# Patient Record
Sex: Male | Born: 1960 | Race: White | Hispanic: No | Marital: Married | Smoking: Former smoker
Health system: Southern US, Community
[De-identification: ages and names within clinical notes are randomized; demographics above are authoritative.]

## PROBLEM LIST (undated history)

## (undated) DIAGNOSIS — Z90410 Acquired total absence of pancreas: Secondary | ICD-10-CM

## (undated) DIAGNOSIS — E891 Postprocedural hypoinsulinemia: Secondary | ICD-10-CM

## (undated) DIAGNOSIS — C7A8 Other malignant neuroendocrine tumors: Secondary | ICD-10-CM

## (undated) DIAGNOSIS — I951 Orthostatic hypotension: Secondary | ICD-10-CM

## (undated) DIAGNOSIS — Z7189 Other specified counseling: Secondary | ICD-10-CM

## (undated) DIAGNOSIS — D497 Neoplasm of unspecified behavior of endocrine glands and other parts of nervous system: Secondary | ICD-10-CM

## (undated) DIAGNOSIS — R51 Headache: Secondary | ICD-10-CM

## (undated) DIAGNOSIS — Q858 Other phakomatoses, not elsewhere classified: Secondary | ICD-10-CM

## (undated) DIAGNOSIS — D509 Iron deficiency anemia, unspecified: Secondary | ICD-10-CM

## (undated) DIAGNOSIS — Q8583 Von Hippel-Lindau syndrome: Secondary | ICD-10-CM

## (undated) DIAGNOSIS — C7B8 Other secondary neuroendocrine tumors: Secondary | ICD-10-CM

## (undated) DIAGNOSIS — E139 Other specified diabetes mellitus without complications: Secondary | ICD-10-CM

## (undated) DIAGNOSIS — Z9289 Personal history of other medical treatment: Secondary | ICD-10-CM

## (undated) DIAGNOSIS — I10 Essential (primary) hypertension: Secondary | ICD-10-CM

## (undated) DIAGNOSIS — D649 Anemia, unspecified: Secondary | ICD-10-CM

## (undated) DIAGNOSIS — C642 Malignant neoplasm of left kidney, except renal pelvis: Secondary | ICD-10-CM

## (undated) DIAGNOSIS — E119 Type 2 diabetes mellitus without complications: Secondary | ICD-10-CM

## (undated) HISTORY — PX: ENUCLEATION: SHX628

## (undated) HISTORY — PX: BACK SURGERY: SHX140

## (undated) HISTORY — PX: CRANIOTOMY FOR TUMOR: SUR345

## (undated) HISTORY — PX: MUSCLE FLAP MYOCUTANEOUS / FASCIOCUTANEOUS OF TRUNK: SUR869

## (undated) HISTORY — PX: CERVICAL SPINE SURGERY: SHX589

## (undated) HISTORY — PX: RECONSTRUCTION BREAST W/ TRAM FLAP: SUR1079

## (undated) HISTORY — PX: PERIPHERALLY INSERTED CENTRAL CATHETER INSERTION: SHX2221

## (undated) HISTORY — PX: PANCREAS SURGERY: SHX731

## (undated) HISTORY — DX: Iron deficiency anemia, unspecified: D50.9

## (undated) HISTORY — PX: PARTIAL NEPHRECTOMY: SHX414

---
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
## 1998-06-13 ENCOUNTER — Ambulatory Visit (HOSPITAL_COMMUNITY): Admission: RE | Admit: 1998-06-13 | Discharge: 1998-06-13 | Payer: Self-pay | Admitting: Neurosurgery

## 1999-02-09 ENCOUNTER — Ambulatory Visit (HOSPITAL_COMMUNITY): Admission: RE | Admit: 1999-02-09 | Discharge: 1999-02-09 | Payer: Self-pay | Admitting: Urology

## 1999-02-09 ENCOUNTER — Encounter: Payer: Self-pay | Admitting: Urology

## 1999-03-26 ENCOUNTER — Ambulatory Visit (HOSPITAL_COMMUNITY): Admission: RE | Admit: 1999-03-26 | Discharge: 1999-03-26 | Payer: Self-pay | Admitting: Neurosurgery

## 1999-03-26 ENCOUNTER — Encounter: Payer: Self-pay | Admitting: Neurosurgery

## 1999-09-24 ENCOUNTER — Encounter: Payer: Self-pay | Admitting: Neurosurgery

## 1999-09-24 ENCOUNTER — Ambulatory Visit (HOSPITAL_COMMUNITY): Admission: RE | Admit: 1999-09-24 | Discharge: 1999-09-24 | Payer: Self-pay | Admitting: Neurosurgery

## 2000-10-04 ENCOUNTER — Ambulatory Visit (HOSPITAL_COMMUNITY): Admission: RE | Admit: 2000-10-04 | Discharge: 2000-10-04 | Payer: Self-pay | Admitting: Urology

## 2000-10-04 ENCOUNTER — Encounter: Payer: Self-pay | Admitting: Urology

## 2000-10-06 ENCOUNTER — Encounter: Payer: Self-pay | Admitting: Orthopedic Surgery

## 2000-10-06 ENCOUNTER — Ambulatory Visit (HOSPITAL_COMMUNITY): Admission: RE | Admit: 2000-10-06 | Discharge: 2000-10-06 | Payer: Self-pay | Admitting: Orthopedic Surgery

## 2001-03-08 ENCOUNTER — Ambulatory Visit (HOSPITAL_COMMUNITY): Admission: RE | Admit: 2001-03-08 | Discharge: 2001-03-08 | Payer: Self-pay | Admitting: *Deleted

## 2001-03-09 ENCOUNTER — Encounter (INDEPENDENT_AMBULATORY_CARE_PROVIDER_SITE_OTHER): Payer: Self-pay | Admitting: Specialist

## 2001-03-09 ENCOUNTER — Encounter: Payer: Self-pay | Admitting: Urology

## 2001-03-09 ENCOUNTER — Inpatient Hospital Stay (HOSPITAL_COMMUNITY): Admission: AD | Admit: 2001-03-09 | Discharge: 2001-04-19 | Payer: Self-pay | Admitting: Urology

## 2001-03-13 ENCOUNTER — Encounter: Payer: Self-pay | Admitting: Urology

## 2006-05-31 ENCOUNTER — Ambulatory Visit (HOSPITAL_COMMUNITY): Admission: RE | Admit: 2006-05-31 | Discharge: 2006-05-31 | Payer: Self-pay | Admitting: Neurosurgery

## 2008-04-23 ENCOUNTER — Ambulatory Visit: Payer: Self-pay | Admitting: Hematology & Oncology

## 2008-05-15 LAB — CBC & DIFF AND RETIC
BASO%: 0.4 % (ref 0.0–2.0)
EOS%: 1.5 % (ref 0.0–7.0)
HGB: 11.2 g/dL — ABNORMAL LOW (ref 13.0–17.1)
IRF: 0.21 (ref 0.070–0.380)
MCH: 25.3 pg — ABNORMAL LOW (ref 28.0–33.4)
MCHC: 33.1 g/dL (ref 32.0–35.9)
RDW: 16.4 % — ABNORMAL HIGH (ref 11.2–14.6)
RETIC #: 87.1 10*3/uL (ref 31.8–103.9)
Retic %: 2 % (ref 0.7–2.3)
lymph#: 1.7 10*3/uL (ref 0.9–3.3)

## 2008-05-15 LAB — CHCC SMEAR

## 2008-05-17 LAB — FERRITIN: Ferritin: 10 ng/mL — ABNORMAL LOW (ref 22–322)

## 2008-05-17 LAB — COMPREHENSIVE METABOLIC PANEL
ALT: 37 U/L (ref 0–53)
AST: 18 U/L (ref 0–37)
Creatinine, Ser: 0.94 mg/dL (ref 0.40–1.50)
Total Bilirubin: 0.2 mg/dL — ABNORMAL LOW (ref 0.3–1.2)

## 2008-05-17 LAB — ERYTHROPOIETIN: Erythropoietin: 23.7 m[IU]/mL (ref 2.6–34.0)

## 2008-06-04 ENCOUNTER — Ambulatory Visit: Payer: Self-pay | Admitting: Hematology & Oncology

## 2008-06-24 ENCOUNTER — Ambulatory Visit: Payer: Self-pay | Admitting: Hematology & Oncology

## 2008-06-26 LAB — CBC WITH DIFFERENTIAL (CANCER CENTER ONLY)
BASO%: 0.6 % (ref 0.0–2.0)
LYMPH#: 2.1 10*3/uL (ref 0.9–3.3)
LYMPH%: 23.9 % (ref 14.0–48.0)
MONO#: 0.9 10*3/uL (ref 0.1–0.9)
Platelets: 350 10*3/uL (ref 145–400)
RDW: 15.8 % — ABNORMAL HIGH (ref 10.5–14.6)
WBC: 8.7 10*3/uL (ref 4.0–10.0)

## 2008-06-26 LAB — FERRITIN: Ferritin: 108 ng/mL (ref 22–322)

## 2008-06-26 LAB — RETICULOCYTES (CHCC)
ABS Retic: 20.5 10*3/uL (ref 19.0–186.0)
Retic Ct Pct: 0.4 % (ref 0.4–3.1)

## 2008-08-28 ENCOUNTER — Inpatient Hospital Stay (HOSPITAL_COMMUNITY): Admission: RE | Admit: 2008-08-28 | Discharge: 2008-09-01 | Payer: Self-pay | Admitting: Gastroenterology

## 2008-08-28 ENCOUNTER — Encounter (INDEPENDENT_AMBULATORY_CARE_PROVIDER_SITE_OTHER): Payer: Self-pay | Admitting: Gastroenterology

## 2008-08-29 ENCOUNTER — Ambulatory Visit: Payer: Self-pay | Admitting: Hematology & Oncology

## 2008-09-13 ENCOUNTER — Ambulatory Visit: Payer: Self-pay | Admitting: Hematology & Oncology

## 2008-10-02 ENCOUNTER — Observation Stay (HOSPITAL_COMMUNITY): Admission: EM | Admit: 2008-10-02 | Discharge: 2008-10-07 | Payer: Self-pay | Admitting: Emergency Medicine

## 2009-03-20 ENCOUNTER — Ambulatory Visit: Payer: Self-pay | Admitting: Hematology & Oncology

## 2009-03-21 LAB — CBC WITH DIFFERENTIAL (CANCER CENTER ONLY)
BASO%: 0.6 % (ref 0.0–2.0)
EOS%: 2.1 % (ref 0.0–7.0)
LYMPH%: 26.3 % (ref 14.0–48.0)
MCH: 27.1 pg — ABNORMAL LOW (ref 28.0–33.4)
MCV: 81 fL — ABNORMAL LOW (ref 82–98)
MONO%: 10.9 % (ref 0.0–13.0)
Platelets: 193 10*3/uL (ref 145–400)
RDW: 11.8 % (ref 10.5–14.6)

## 2009-03-21 LAB — RETICULOCYTES (CHCC)
RBC.: 4.71 MIL/uL (ref 4.22–5.81)
Retic Ct Pct: 0.4 % (ref 0.4–3.1)

## 2009-03-21 LAB — CHCC SATELLITE - SMEAR

## 2009-03-25 ENCOUNTER — Ambulatory Visit: Admission: RE | Admit: 2009-03-25 | Discharge: 2009-06-23 | Payer: Self-pay | Admitting: Radiation Oncology

## 2009-04-03 ENCOUNTER — Inpatient Hospital Stay (HOSPITAL_COMMUNITY): Admission: EM | Admit: 2009-04-03 | Discharge: 2009-04-05 | Payer: Self-pay | Admitting: Emergency Medicine

## 2009-05-02 ENCOUNTER — Ambulatory Visit (HOSPITAL_COMMUNITY): Admission: RE | Admit: 2009-05-02 | Discharge: 2009-05-02 | Payer: Self-pay | Admitting: Radiation Oncology

## 2009-06-25 ENCOUNTER — Ambulatory Visit: Admission: RE | Admit: 2009-06-25 | Discharge: 2009-07-27 | Payer: Self-pay | Admitting: Radiation Oncology

## 2010-02-23 ENCOUNTER — Inpatient Hospital Stay (HOSPITAL_COMMUNITY): Admission: EM | Admit: 2010-02-23 | Discharge: 2010-02-28 | Payer: Self-pay | Admitting: Emergency Medicine

## 2010-02-25 ENCOUNTER — Ambulatory Visit: Payer: Self-pay | Admitting: Hematology & Oncology

## 2010-03-09 ENCOUNTER — Ambulatory Visit: Payer: Self-pay | Admitting: Hematology & Oncology

## 2010-03-23 ENCOUNTER — Inpatient Hospital Stay (HOSPITAL_COMMUNITY): Admission: AD | Admit: 2010-03-23 | Discharge: 2010-03-29 | Payer: Self-pay | Admitting: Hematology & Oncology

## 2010-03-23 ENCOUNTER — Ambulatory Visit: Payer: Self-pay | Admitting: Hematology & Oncology

## 2010-04-08 ENCOUNTER — Encounter: Admission: RE | Admit: 2010-04-08 | Discharge: 2010-04-08 | Payer: Self-pay | Admitting: Hematology & Oncology

## 2010-06-04 ENCOUNTER — Encounter: Admission: RE | Admit: 2010-06-04 | Discharge: 2010-06-04 | Payer: Self-pay | Admitting: Hematology & Oncology

## 2010-06-18 ENCOUNTER — Ambulatory Visit (HOSPITAL_COMMUNITY): Admission: RE | Admit: 2010-06-18 | Discharge: 2010-06-18 | Payer: Self-pay | Admitting: Interventional Radiology

## 2010-07-07 ENCOUNTER — Ambulatory Visit (HOSPITAL_COMMUNITY): Admission: RE | Admit: 2010-07-07 | Discharge: 2010-07-07 | Payer: Self-pay | Admitting: Interventional Radiology

## 2010-08-06 ENCOUNTER — Ambulatory Visit (HOSPITAL_COMMUNITY): Admission: RE | Admit: 2010-08-06 | Discharge: 2010-08-06 | Payer: Self-pay | Admitting: Interventional Radiology

## 2010-09-22 ENCOUNTER — Ambulatory Visit (HOSPITAL_COMMUNITY): Admission: RE | Admit: 2010-09-22 | Discharge: 2010-09-22 | Payer: Self-pay | Admitting: Interventional Radiology

## 2010-09-30 ENCOUNTER — Encounter: Admission: RE | Admit: 2010-09-30 | Discharge: 2010-09-30 | Payer: Self-pay | Admitting: Interventional Radiology

## 2010-10-16 ENCOUNTER — Ambulatory Visit: Payer: Self-pay | Admitting: Diagnostic Radiology

## 2010-10-16 ENCOUNTER — Ambulatory Visit (HOSPITAL_BASED_OUTPATIENT_CLINIC_OR_DEPARTMENT_OTHER): Admission: RE | Admit: 2010-10-16 | Discharge: 2010-10-16 | Payer: Self-pay | Admitting: Hematology & Oncology

## 2010-10-16 ENCOUNTER — Ambulatory Visit: Payer: Self-pay | Admitting: Hematology & Oncology

## 2010-10-16 LAB — CMP (CANCER CENTER ONLY)
AST: 37 U/L (ref 11–38)
Alkaline Phosphatase: 229 U/L — ABNORMAL HIGH (ref 26–84)
BUN, Bld: 18 mg/dL (ref 7–22)
Chloride: 88 mEq/L — ABNORMAL LOW (ref 98–108)
Potassium: 4.4 mEq/L (ref 3.3–4.7)
Total Protein: 8 g/dL (ref 6.4–8.1)

## 2010-10-16 LAB — URINALYSIS, MICROSCOPIC (CHCC SATELLITE)
Blood: NEGATIVE
Ketones: NEGATIVE mg/dL
Protein: 30 mg/dL
Specific Gravity, Urine: 1.02 (ref 1.003–1.035)
pH: 6 (ref 4.60–8.00)

## 2010-10-16 LAB — CBC WITH DIFFERENTIAL (CANCER CENTER ONLY)
BASO%: 0.6 % (ref 0.0–2.0)
HGB: 12.6 g/dL — ABNORMAL LOW (ref 13.0–17.1)
LYMPH%: 9.6 % — ABNORMAL LOW (ref 14.0–48.0)
MCH: 25.3 pg — ABNORMAL LOW (ref 28.0–33.4)
MCV: 76 fL — ABNORMAL LOW (ref 82–98)
MONO#: 0.8 10*3/uL (ref 0.1–0.9)
MONO%: 8.4 % (ref 0.0–13.0)
RDW: 11.5 % (ref 10.5–14.6)

## 2010-11-18 ENCOUNTER — Encounter
Admission: RE | Admit: 2010-11-18 | Discharge: 2010-11-18 | Payer: Self-pay | Source: Home / Self Care | Attending: Hematology & Oncology | Admitting: Hematology & Oncology

## 2010-11-18 ENCOUNTER — Encounter
Admission: RE | Admit: 2010-11-18 | Discharge: 2010-11-18 | Payer: Self-pay | Source: Home / Self Care | Attending: Interventional Radiology | Admitting: Interventional Radiology

## 2010-12-26 ENCOUNTER — Encounter: Payer: Self-pay | Admitting: Interventional Radiology

## 2011-01-01 ENCOUNTER — Ambulatory Visit: Payer: Self-pay | Admitting: Hematology & Oncology

## 2011-01-04 LAB — CBC WITH DIFFERENTIAL (CANCER CENTER ONLY)
BASO#: 0 10*3/uL (ref 0.0–0.2)
Eosinophils Absolute: 0.1 10*3/uL (ref 0.0–0.5)
HCT: 38.5 % — ABNORMAL LOW (ref 38.7–49.9)
LYMPH#: 0.8 10*3/uL — ABNORMAL LOW (ref 0.9–3.3)
LYMPH%: 16.4 % (ref 14.0–48.0)
MONO%: 13 % (ref 0.0–13.0)
NEUT#: 3.5 10*3/uL (ref 1.5–6.5)
NEUT%: 67.9 % (ref 40.0–80.0)
RDW: 13.9 % (ref 10.5–14.6)

## 2011-01-09 LAB — COMPREHENSIVE METABOLIC PANEL
ALT: 50 U/L (ref 0–53)
AST: 55 U/L — ABNORMAL HIGH (ref 0–37)
Alkaline Phosphatase: 180 U/L — ABNORMAL HIGH (ref 39–117)
Calcium: 9.1 mg/dL (ref 8.4–10.5)
Potassium: 4.1 mEq/L (ref 3.5–5.3)
Total Protein: 6.8 g/dL (ref 6.0–8.3)

## 2011-01-09 LAB — CHROMOGRANIN A

## 2011-01-09 LAB — LACTATE DEHYDROGENASE: LDH: 199 U/L (ref 94–250)

## 2011-02-12 ENCOUNTER — Encounter (HOSPITAL_BASED_OUTPATIENT_CLINIC_OR_DEPARTMENT_OTHER): Payer: Commercial Managed Care - PPO | Admitting: Hematology & Oncology

## 2011-02-12 ENCOUNTER — Other Ambulatory Visit: Payer: Self-pay | Admitting: Hematology & Oncology

## 2011-02-12 DIAGNOSIS — C787 Secondary malignant neoplasm of liver and intrahepatic bile duct: Secondary | ICD-10-CM

## 2011-02-12 DIAGNOSIS — N390 Urinary tract infection, site not specified: Secondary | ICD-10-CM

## 2011-02-12 DIAGNOSIS — E119 Type 2 diabetes mellitus without complications: Secondary | ICD-10-CM

## 2011-02-12 DIAGNOSIS — Q859 Phakomatosis, unspecified: Secondary | ICD-10-CM

## 2011-02-12 DIAGNOSIS — C7A098 Malignant carcinoid tumors of other sites: Secondary | ICD-10-CM

## 2011-02-12 DIAGNOSIS — Z87440 Personal history of urinary (tract) infections: Secondary | ICD-10-CM

## 2011-02-12 LAB — CBC WITH DIFFERENTIAL (CANCER CENTER ONLY)
BASO#: 0 10*3/uL (ref 0.0–0.2)
Eosinophils Absolute: 0.1 10*3/uL (ref 0.0–0.5)
HCT: 39.6 % (ref 38.7–49.9)
LYMPH%: 22.8 % (ref 14.0–48.0)
MCH: 23.3 pg — ABNORMAL LOW (ref 28.0–33.4)
MCV: 71 fL — ABNORMAL LOW (ref 82–98)
MONO#: 0.5 10*3/uL (ref 0.1–0.9)
MONO%: 10.1 % (ref 0.0–13.0)
NEUT%: 65.1 % (ref 40.0–80.0)
Platelets: 230 10*3/uL (ref 145–400)
RBC: 5.62 10*6/uL (ref 4.20–5.70)

## 2011-02-16 LAB — COMPREHENSIVE METABOLIC PANEL
ALT: 34 U/L (ref 0–53)
AST: 46 U/L — ABNORMAL HIGH (ref 0–37)
Calcium: 9.2 mg/dL (ref 8.4–10.5)
Chloride: 100 mEq/L (ref 96–112)
Creatinine, Ser: 0.62 mg/dL (ref 0.40–1.50)
Potassium: 4.1 mEq/L (ref 3.5–5.3)
Sodium: 139 mEq/L (ref 135–145)

## 2011-02-16 LAB — PREALBUMIN: Prealbumin: 18 mg/dL (ref 17.0–34.0)

## 2011-02-18 LAB — CBC
MCH: 27.9 pg (ref 26.0–34.0)
MCV: 82.4 fL (ref 78.0–100.0)
Platelets: 186 10*3/uL (ref 150–400)
RBC: 5.09 MIL/uL (ref 4.22–5.81)
RDW: 13.1 % (ref 11.5–15.5)

## 2011-02-18 LAB — PROTIME-INR: Prothrombin Time: 14.1 seconds (ref 11.6–15.2)

## 2011-02-19 LAB — COMPREHENSIVE METABOLIC PANEL
ALT: 65 U/L — ABNORMAL HIGH (ref 0–53)
AST: 50 U/L — ABNORMAL HIGH (ref 0–37)
Albumin: 3.6 g/dL (ref 3.5–5.2)
Alkaline Phosphatase: 165 U/L — ABNORMAL HIGH (ref 39–117)
BUN: 23 mg/dL (ref 6–23)
Chloride: 99 mEq/L (ref 96–112)
GFR calc Af Amer: >60 mL/min (ref >60)
Potassium: 4.1 mEq/L (ref 3.5–5.1)
Sodium: 137 mEq/L (ref 135–145)
Total Protein: 6.9 g/dL (ref 6.0–8.3)

## 2011-02-19 LAB — CBC
HCT: 42.6 % (ref 39.0–52.0)
Hemoglobin: 14.3 g/dL (ref 13.0–17.0)
MCH: 27.9 pg (ref 26.0–34.0)
MCHC: 33.6 g/dL (ref 30.0–36.0)
MCV: 83.1 fL (ref 78.0–100.0)
RBC: 5.13 MIL/uL (ref 4.22–5.81)

## 2011-02-19 LAB — GLUCOSE, CAPILLARY: Glucose-Capillary: 221 mg/dL — ABNORMAL HIGH (ref 70–99)

## 2011-02-19 LAB — PROTIME-INR
INR: 1.04 (ref 0.00–1.49)
Prothrombin Time: 13.8 seconds (ref 11.6–15.2)

## 2011-02-21 LAB — COMPREHENSIVE METABOLIC PANEL
ALT: 55 U/L — ABNORMAL HIGH (ref 0–53)
AST: 59 U/L — ABNORMAL HIGH (ref 0–37)
CO2: 29 mEq/L (ref 19–32)
Chloride: 104 mEq/L (ref 96–112)
Creatinine, Ser: 0.67 mg/dL (ref 0.4–1.5)
GFR calc Af Amer: >60 mL/min (ref >60)
GFR calc non Af Amer: >60 mL/min (ref >60)
Glucose, Bld: 109 mg/dL — ABNORMAL HIGH (ref 70–99)
Total Bilirubin: 0.4 mg/dL (ref 0.3–1.2)

## 2011-02-21 LAB — CBC
HCT: 43 % (ref 39.0–52.0)
Hemoglobin: 14.4 g/dL (ref 13.0–17.0)
MCHC: 33.5 g/dL (ref 30.0–36.0)
MCV: 83.5 fL (ref 78.0–100.0)

## 2011-02-21 LAB — GLUCOSE, CAPILLARY

## 2011-02-23 LAB — GLUCOSE, CAPILLARY
Glucose-Capillary: 141 mg/dL — ABNORMAL HIGH (ref 70–99)
Glucose-Capillary: 194 mg/dL — ABNORMAL HIGH (ref 70–99)
Glucose-Capillary: 201 mg/dL — ABNORMAL HIGH (ref 70–99)
Glucose-Capillary: 264 mg/dL — ABNORMAL HIGH (ref 70–99)
Glucose-Capillary: 284 mg/dL — ABNORMAL HIGH (ref 70–99)
Glucose-Capillary: 291 mg/dL — ABNORMAL HIGH (ref 70–99)
Glucose-Capillary: 336 mg/dL — ABNORMAL HIGH (ref 70–99)
Glucose-Capillary: 338 mg/dL — ABNORMAL HIGH (ref 70–99)
Glucose-Capillary: 342 mg/dL — ABNORMAL HIGH (ref 70–99)
Glucose-Capillary: 369 mg/dL — ABNORMAL HIGH (ref 70–99)
Glucose-Capillary: 385 mg/dL — ABNORMAL HIGH (ref 70–99)
Glucose-Capillary: 406 mg/dL — ABNORMAL HIGH (ref 70–99)
Glucose-Capillary: 522 mg/dL — ABNORMAL HIGH (ref 70–99)
Glucose-Capillary: 74 mg/dL (ref 70–99)

## 2011-02-23 LAB — CBC
HCT: 40.5 % (ref 39.0–52.0)
Hemoglobin: 13.5 g/dL (ref 13.0–17.0)
Hemoglobin: 13.6 g/dL (ref 13.0–17.0)
MCHC: 33.4 g/dL (ref 30.0–36.0)
MCV: 84.3 fL (ref 78.0–100.0)
RBC: 4.81 MIL/uL (ref 4.22–5.81)
RBC: 4.83 MIL/uL (ref 4.22–5.81)
RDW: 13.1 % (ref 11.5–15.5)
WBC: 9.1 10*3/uL (ref 4.0–10.5)

## 2011-02-23 LAB — URINALYSIS, MICROSCOPIC ONLY
Glucose, UA: 250 mg/dL — AB
pH: 7 (ref 5.0–8.0)

## 2011-02-23 LAB — COMPREHENSIVE METABOLIC PANEL
ALT: 45 U/L (ref 0–53)
Alkaline Phosphatase: 110 U/L (ref 39–117)
CO2: 30 mEq/L (ref 19–32)
Glucose, Bld: 282 mg/dL — ABNORMAL HIGH (ref 70–99)
Potassium: 4.2 mEq/L (ref 3.5–5.1)
Sodium: 135 mEq/L (ref 135–145)
Total Protein: 6.5 g/dL (ref 6.0–8.3)

## 2011-02-23 LAB — URINALYSIS, ROUTINE W REFLEX MICROSCOPIC
Nitrite: POSITIVE — AB
Specific Gravity, Urine: 1.029 (ref 1.005–1.030)
Urobilinogen, UA: 1 mg/dL (ref 0.0–1.0)

## 2011-02-23 LAB — BASIC METABOLIC PANEL
CO2: 31 mEq/L (ref 19–32)
Chloride: 94 mEq/L — ABNORMAL LOW (ref 96–112)
Creatinine, Ser: 0.56 mg/dL (ref 0.4–1.5)
GFR calc Af Amer: >60 mL/min (ref >60)
Potassium: 4.2 mEq/L (ref 3.5–5.1)
Sodium: 135 mEq/L (ref 135–145)

## 2011-02-23 LAB — PREALBUMIN: Prealbumin: 20.2 mg/dL (ref 18.0–45.0)

## 2011-02-23 LAB — URINE CULTURE

## 2011-02-23 LAB — URINE MICROSCOPIC-ADD ON

## 2011-02-23 LAB — AMMONIA: Ammonia: 24 umol/L (ref 11–35)

## 2011-02-26 LAB — URINALYSIS, ROUTINE W REFLEX MICROSCOPIC
Bilirubin Urine: NEGATIVE
Glucose, UA: 250 mg/dL — AB
Ketones, ur: NEGATIVE mg/dL
Protein, ur: NEGATIVE mg/dL

## 2011-02-26 LAB — URINE CULTURE: Colony Count: 75000

## 2011-02-26 LAB — GLUCOSE, CAPILLARY
Glucose-Capillary: 108 mg/dL — ABNORMAL HIGH (ref 70–99)
Glucose-Capillary: 114 mg/dL — ABNORMAL HIGH (ref 70–99)
Glucose-Capillary: 152 mg/dL — ABNORMAL HIGH (ref 70–99)
Glucose-Capillary: 172 mg/dL — ABNORMAL HIGH (ref 70–99)
Glucose-Capillary: 181 mg/dL — ABNORMAL HIGH (ref 70–99)
Glucose-Capillary: 188 mg/dL — ABNORMAL HIGH (ref 70–99)
Glucose-Capillary: 213 mg/dL — ABNORMAL HIGH (ref 70–99)
Glucose-Capillary: 220 mg/dL — ABNORMAL HIGH (ref 70–99)
Glucose-Capillary: 227 mg/dL — ABNORMAL HIGH (ref 70–99)
Glucose-Capillary: 259 mg/dL — ABNORMAL HIGH (ref 70–99)
Glucose-Capillary: 260 mg/dL — ABNORMAL HIGH (ref 70–99)
Glucose-Capillary: 265 mg/dL — ABNORMAL HIGH (ref 70–99)
Glucose-Capillary: 295 mg/dL — ABNORMAL HIGH (ref 70–99)
Glucose-Capillary: 371 mg/dL — ABNORMAL HIGH (ref 70–99)
Glucose-Capillary: 376 mg/dL — ABNORMAL HIGH (ref 70–99)
Glucose-Capillary: 394 mg/dL — ABNORMAL HIGH (ref 70–99)
Glucose-Capillary: 398 mg/dL — ABNORMAL HIGH (ref 70–99)
Glucose-Capillary: 65 mg/dL — ABNORMAL LOW (ref 70–99)

## 2011-02-26 LAB — BASIC METABOLIC PANEL
CO2: 33 mEq/L — ABNORMAL HIGH (ref 19–32)
Calcium: 9 mg/dL (ref 8.4–10.5)
Creatinine, Ser: 0.53 mg/dL (ref 0.4–1.5)
GFR calc Af Amer: >60 mL/min (ref >60)
Glucose, Bld: 172 mg/dL — ABNORMAL HIGH (ref 70–99)

## 2011-02-26 LAB — CBC
HCT: 41.7 % (ref 39.0–52.0)
Hemoglobin: 12.7 g/dL — ABNORMAL LOW (ref 13.0–17.0)
MCHC: 32.6 g/dL (ref 30.0–36.0)
MCHC: 33.2 g/dL (ref 30.0–36.0)
MCV: 83.8 fL (ref 78.0–100.0)
MCV: 85.3 fL (ref 78.0–100.0)
Platelets: 201 10*3/uL (ref 150–400)
Platelets: 227 10*3/uL (ref 150–400)
RBC: 4.56 MIL/uL (ref 4.22–5.81)
RBC: 4.71 MIL/uL (ref 4.22–5.81)
RBC: 5.22 MIL/uL (ref 4.22–5.81)
RDW: 12.9 % (ref 11.5–15.5)
RDW: 13 % (ref 11.5–15.5)
RDW: 13.1 % (ref 11.5–15.5)
RDW: 13.4 % (ref 11.5–15.5)
WBC: 5.6 10*3/uL (ref 4.0–10.5)

## 2011-02-26 LAB — COMPREHENSIVE METABOLIC PANEL
ALT: 33 U/L (ref 0–53)
AST: 42 U/L — ABNORMAL HIGH (ref 0–37)
Alkaline Phosphatase: 95 U/L (ref 39–117)
CO2: 31 mEq/L (ref 19–32)
CO2: 32 mEq/L (ref 19–32)
Calcium: 9.3 mg/dL (ref 8.4–10.5)
Chloride: 96 mEq/L (ref 96–112)
Creatinine, Ser: 0.65 mg/dL (ref 0.4–1.5)
GFR calc Af Amer: >60 mL/min (ref >60)
GFR calc non Af Amer: >60 mL/min (ref >60)
Glucose, Bld: 110 mg/dL — ABNORMAL HIGH (ref 70–99)
Potassium: 4.1 mEq/L (ref 3.5–5.1)
Sodium: 134 mEq/L — ABNORMAL LOW (ref 135–145)
Total Protein: 6 g/dL (ref 6.0–8.3)
Total Protein: 7.1 g/dL (ref 6.0–8.3)

## 2011-02-26 LAB — POTASSIUM: Potassium: 4.3 mEq/L (ref 3.5–5.1)

## 2011-02-26 LAB — POCT I-STAT, CHEM 8
BUN: 30 mg/dL — ABNORMAL HIGH (ref 6–23)
Creatinine, Ser: 0.6 mg/dL (ref 0.4–1.5)
Potassium: 6.5 mEq/L (ref 3.5–5.1)
Sodium: 127 mEq/L — ABNORMAL LOW (ref 135–145)

## 2011-02-26 LAB — DIFFERENTIAL
Eosinophils Relative: 1 % (ref 0–5)
Lymphocytes Relative: 29 % (ref 12–46)
Lymphs Abs: 1.8 10*3/uL (ref 0.7–4.0)
Neutrophils Relative %: 60 % (ref 43–77)

## 2011-02-26 LAB — URINE MICROSCOPIC-ADD ON

## 2011-03-09 ENCOUNTER — Other Ambulatory Visit: Payer: Self-pay | Admitting: Interventional Radiology

## 2011-03-09 DIAGNOSIS — C259 Malignant neoplasm of pancreas, unspecified: Secondary | ICD-10-CM

## 2011-03-11 ENCOUNTER — Other Ambulatory Visit: Payer: Self-pay | Admitting: Hematology & Oncology

## 2011-03-11 DIAGNOSIS — C259 Malignant neoplasm of pancreas, unspecified: Secondary | ICD-10-CM

## 2011-03-16 LAB — CBC
MCHC: 33.4 g/dL (ref 30.0–36.0)
MCV: 83.9 fL (ref 78.0–100.0)
Platelets: 205 10*3/uL (ref 150–400)
RDW: 13.3 % (ref 11.5–15.5)

## 2011-03-16 LAB — GLUCOSE, CAPILLARY

## 2011-03-16 LAB — DIFFERENTIAL
Basophils Absolute: 0 10*3/uL (ref 0.0–0.1)
Basophils Relative: 1 % (ref 0–1)
Eosinophils Absolute: 0.1 10*3/uL (ref 0.0–0.7)
Monocytes Relative: 13 % — ABNORMAL HIGH (ref 3–12)
Neutro Abs: 1.9 10*3/uL (ref 1.7–7.7)
Neutrophils Relative %: 35 % — ABNORMAL LOW (ref 43–77)

## 2011-03-16 LAB — BASIC METABOLIC PANEL
BUN: 22 mg/dL (ref 6–23)
CO2: 33 mEq/L — ABNORMAL HIGH (ref 19–32)
Calcium: 8.6 mg/dL (ref 8.4–10.5)
Chloride: 99 mEq/L (ref 96–112)
Creatinine, Ser: 0.82 mg/dL (ref 0.4–1.5)
GFR calc Af Amer: >60 mL/min (ref >60)
Glucose, Bld: 51 mg/dL — ABNORMAL LOW (ref 70–99)

## 2011-03-17 LAB — URINALYSIS, ROUTINE W REFLEX MICROSCOPIC
Bilirubin Urine: NEGATIVE
Nitrite: POSITIVE — AB
Protein, ur: NEGATIVE mg/dL
Urobilinogen, UA: 0.2 mg/dL (ref 0.0–1.0)
pH: 6 (ref 5.0–8.0)

## 2011-03-17 LAB — COMPREHENSIVE METABOLIC PANEL
ALT: 102 U/L — ABNORMAL HIGH (ref 0–53)
AST: 102 U/L — ABNORMAL HIGH (ref 0–37)
Albumin: 3.2 g/dL — ABNORMAL LOW (ref 3.5–5.2)
Alkaline Phosphatase: 93 U/L (ref 39–117)
CO2: 29 mEq/L (ref 19–32)
Chloride: 105 mEq/L (ref 96–112)
GFR calc Af Amer: >60 mL/min (ref >60)
GFR calc non Af Amer: >60 mL/min (ref >60)
Potassium: 4.5 mEq/L (ref 3.5–5.1)
Total Bilirubin: 0.6 mg/dL (ref 0.3–1.2)

## 2011-03-17 LAB — PROTIME-INR: Prothrombin Time: 14.8 seconds (ref 11.6–15.2)

## 2011-03-17 LAB — DIFFERENTIAL
Basophils Absolute: 0 10*3/uL (ref 0.0–0.1)
Basophils Relative: 0 % (ref 0–1)
Basophils Relative: 0 % (ref 0–1)
Eosinophils Absolute: 0 10*3/uL (ref 0.0–0.7)
Eosinophils Absolute: 0.1 10*3/uL (ref 0.0–0.7)
Eosinophils Relative: 0 % (ref 0–5)
Eosinophils Relative: 1 % (ref 0–5)
Monocytes Absolute: 0.1 10*3/uL (ref 0.1–1.0)
Monocytes Relative: 8 % (ref 3–12)
Neutrophils Relative %: 64 % (ref 43–77)

## 2011-03-17 LAB — GLUCOSE, CAPILLARY
Glucose-Capillary: 200 mg/dL — ABNORMAL HIGH (ref 70–99)
Glucose-Capillary: 204 mg/dL — ABNORMAL HIGH (ref 70–99)
Glucose-Capillary: 212 mg/dL — ABNORMAL HIGH (ref 70–99)
Glucose-Capillary: 242 mg/dL — ABNORMAL HIGH (ref 70–99)
Glucose-Capillary: 280 mg/dL — ABNORMAL HIGH (ref 70–99)
Glucose-Capillary: 326 mg/dL — ABNORMAL HIGH (ref 70–99)

## 2011-03-17 LAB — CBC
HCT: 36.7 % — ABNORMAL LOW (ref 39.0–52.0)
HCT: 45 % (ref 39.0–52.0)
MCHC: 33 g/dL (ref 30.0–36.0)
MCV: 83.9 fL (ref 78.0–100.0)
Platelets: 183 10*3/uL (ref 150–400)
Platelets: 200 10*3/uL (ref 150–400)
RBC: 5.36 MIL/uL (ref 4.22–5.81)
WBC: 4.9 10*3/uL (ref 4.0–10.5)

## 2011-03-17 LAB — BASIC METABOLIC PANEL
BUN: 27 mg/dL — ABNORMAL HIGH (ref 6–23)
CO2: 25 mEq/L (ref 19–32)
Chloride: 104 mEq/L (ref 96–112)
Creatinine, Ser: 0.78 mg/dL (ref 0.4–1.5)
Potassium: 4.1 mEq/L (ref 3.5–5.1)

## 2011-03-17 LAB — CARDIAC PANEL(CRET KIN+CKTOT+MB+TROPI)
Relative Index: INVALID (ref 0.0–2.5)
Relative Index: INVALID (ref 0.0–2.5)
Troponin I: 0.02 ng/mL (ref 0.00–0.06)
Troponin I: 0.05 ng/mL (ref 0.00–0.06)

## 2011-03-17 LAB — TSH: TSH: 0.543 u[IU]/mL (ref 0.350–4.500)

## 2011-03-17 LAB — POCT I-STAT, CHEM 8
BUN: 11 mg/dL (ref 6–23)
Calcium, Ion: 1.17 mmol/L (ref 1.12–1.32)
Chloride: 106 mEq/L (ref 96–112)
HCT: 39 % (ref 39.0–52.0)
Sodium: 138 mEq/L (ref 135–145)
TCO2: 22 mmol/L (ref 0–100)

## 2011-03-17 LAB — APTT: aPTT: 33 seconds (ref 24–37)

## 2011-03-17 LAB — CULTURE, BLOOD (ROUTINE X 2): Culture: NO GROWTH

## 2011-03-17 LAB — GLUCOSE, RANDOM: Glucose, Bld: 330 mg/dL — ABNORMAL HIGH (ref 70–99)

## 2011-03-17 LAB — HEMOGLOBIN A1C
Hgb A1c MFr Bld: 6.7 % — ABNORMAL HIGH (ref 4.6–6.1)
Mean Plasma Glucose: 146 mg/dL

## 2011-03-17 LAB — CK TOTAL AND CKMB (NOT AT ARMC): Relative Index: INVALID (ref 0.0–2.5)

## 2011-03-17 LAB — URINE CULTURE

## 2011-03-23 ENCOUNTER — Other Ambulatory Visit: Payer: Commercial Managed Care - PPO

## 2011-04-09 ENCOUNTER — Other Ambulatory Visit: Payer: Self-pay | Admitting: Hematology & Oncology

## 2011-04-09 ENCOUNTER — Encounter (HOSPITAL_BASED_OUTPATIENT_CLINIC_OR_DEPARTMENT_OTHER): Payer: Commercial Managed Care - PPO | Admitting: Hematology & Oncology

## 2011-04-09 DIAGNOSIS — D481 Neoplasm of uncertain behavior of connective and other soft tissue: Secondary | ICD-10-CM

## 2011-04-09 DIAGNOSIS — Q859 Phakomatosis, unspecified: Secondary | ICD-10-CM

## 2011-04-09 DIAGNOSIS — C7A098 Malignant carcinoid tumors of other sites: Secondary | ICD-10-CM

## 2011-04-09 LAB — CBC WITH DIFFERENTIAL (CANCER CENTER ONLY)
BASO#: 0 10*3/uL (ref 0.0–0.2)
Eosinophils Absolute: 0.1 10*3/uL (ref 0.0–0.5)
HCT: 41.2 % (ref 38.7–49.9)
HGB: 13.5 g/dL (ref 13.0–17.1)
LYMPH#: 0.9 10*3/uL (ref 0.9–3.3)
MCHC: 32.8 g/dL (ref 32.0–35.9)
MONO#: 0.6 10*3/uL (ref 0.1–0.9)
NEUT#: 5 10*3/uL (ref 1.5–6.5)
NEUT%: 75.8 % (ref 40.0–80.0)
RBC: 5.66 10*6/uL (ref 4.20–5.70)

## 2011-04-09 LAB — CMP (CANCER CENTER ONLY)
AST: 56 U/L — ABNORMAL HIGH (ref 11–38)
Albumin: 3.1 g/dL — ABNORMAL LOW (ref 3.3–5.5)
BUN, Bld: 16 mg/dL (ref 7–22)
CO2: 30 mEq/L (ref 18–33)
Calcium: 9.2 mg/dL (ref 8.0–10.3)
Chloride: 92 mEq/L — ABNORMAL LOW (ref 98–108)
Creat: 0.4 mg/dl — ABNORMAL LOW (ref 0.6–1.2)
Glucose, Bld: 217 mg/dL — ABNORMAL HIGH (ref 73–118)
Potassium: 4.3 mEq/L (ref 3.3–4.7)

## 2011-04-13 LAB — PREALBUMIN: Prealbumin: 20.2 mg/dL (ref 17.0–34.0)

## 2011-04-13 LAB — CHROMOGRANIN A: Chromogranin A: 50 ng/mL — ABNORMAL HIGH (ref 1.9–15.0)

## 2011-04-20 NOTE — Op Note (Signed)
NAMEAKAI, [name] NO.:  [id number]   MEDICAL RECORD NO.:  [id number]          PATIENT TYPE:  OIB   LOCATION:  3307                         FACILITY:  [facility]   PHYSICIAN:  Shirley Friar, MDDATE OF BIRTH:  07/15/1961   DATE OF PROCEDURE:  DATE OF DISCHARGE:                               OPERATIVE REPORT   INDICATION:  GI bleed.   MEDICATIONS:  Fentanyl 100 mcg IV, Versed 10 mg IV, Cetacaine spray x2,  epinephrine 3 mL 1:10,000 units injection.   FINDINGS:  Endoscope was inserted through the oropharynx and esophagus  was intubated which was normal in its entirety.  Endoscope was advanced  down into the stomach which revealed normal-appearing stomach mucosa  without any blood products seen in the stomach.  Retroflexion was done  which was unremarkable.  Endoscope was straightened and advanced to the  duodenal bulb which was normal in its appearance.  Upon immediately  entering the second portion of the duodenum, there was a large ulcerated  mass that extended the length of the second portion of the duodenum  which was friable, firm, and with scattered ulcerations.  This mass  covered about one half of the circumference of the duodenum.  The mass  is nonobstructing.  Biopsies were taken of the mass and after biopsies,  active bleeding occurred which required injection of epinephrine for  hemostasis.  A total of 3 mL of epinephrine 1:  10,000 units was  injected to achieve hemostasis.  Endoscope was withdrawn to confirm the  above findings.   ASSESSMENT:  Large ulcerated duodenal mass concerning for malignancy  status post biopsies.   PLAN:  1. Admit for blood transfusion and volume resuscitation.  2. Followup on pathology.  3. If biopsies are inconclusive, then the patient may need to have a      CT scan versus endoscopic ultrasound versus surgery to further      evaluate this duodenal mass.      Shirley Friar, MD  Electronically  Signed     VCS/MEDQ  D:  08/28/2008  T:  08/29/2008  Job:  [id number]   cc:   Everardo All. [name], M.D.

## 2011-04-20 NOTE — Consult Note (Signed)
[name], HALBIG NO.:  [id number]   MEDICAL RECORD NO.:  [id number]          PATIENT TYPE:  INP   LOCATION:  3307                         FACILITY:  [facility]   PHYSICIAN:  Almond Lint, MD       DATE OF BIRTH:  October 26, 1961   DATE OF CONSULTATION:  08/30/2008  DATE OF DISCHARGE:                                 CONSULTATION   REQUESTING PHYSICIAN:  Graylin Shiver, MD   PRIMARY CARE PHYSICIAN:  [name]. [name], MD   ONCOLOGIST:  Rose Phi. Ennever, MD   REASON FOR CONSULTATION:  Bleeding duodenal mass.   HISTORY OF PRESENT ILLNESS:  [name] is a 50 year old male patient  with a known history of von Hippel-Lindau syndrome.  He was diagnosed at  age 41 after developing an eye tumor, later underwent resection of the  tumor and loss of vision in his eye.  Since that time, he has had  multiple complications related to tumor implants including tumors  requiring cervical and thoracic surgeries.  The tumor at the thoracic  level did  invade into the spinal cord and this has caused of  paraplegia.  He has recently undergone right partial nephrectomy.  He  has history of pancreatic insufficiency and steatorrhea because of the  tumors with presumed ductal obstruction.  He has been followed long-term  by the [facility] for this.  Locally, he has been  managed by Dr. Myna Hidalgo in [facility].  He was diagnosed in May 2009 with a  duodenal mass.  Initial biopsies at that time did not show any evidence  of a neuroendocrine tumor but despite the negative biopsy, it was felt  that this probably was a neuroendocrine tumor, so the patient was  watched very closely.  In the past 6 days, the patient has developed an  unusual abdominal discomfort.  Because of his paraplegia, it is  difficult for him to easily evaluate and characterize any abdominal pain  that occurs.  Later, he developed hematochezia and melena.  He discusses  with GI and was sent over for repeat  endoscopy which revealed a large  bleeding friable mass in the second portion of the duodenum.  He has  been admitted to the hospital, placed on n.p.o. status, and been given 2  units of blood.  He has an underlying anemia and some of his symptoms  prior to hematochezia were similar to when he initially developed anemia  symptoms which interestingly now began after his nephrectomy in March  2009.  Subsequently, a verbal report of the biopsy has been called to  the [facility]ology Service stating that the area in the duodenum is a  neuroendocrine tumor.  Since admission, the patient has had no further  episodes of melena or bleeding and he reports being very hungry and  wants to eat.  The patient and his wife also tell me that he has several  masses within the pancreas which they were watching closely that are a  combination of cystic and solid and there is some question at the [facility] is  to whether these areas are neuroendocrine tumors.  Surgical consultation  has been requested.   REVIEW OF SYSTEMS:  As per the history of present illness, otherwise  negative times 11 systems.  CONSTITUTIONAL:  Again, the patient was not  having any fevers or chills, but he was having increased malaise.  CARDIOVASCULAR:  The patient was having tachy palpitations, shortness of  breath, and weakness similar to when he experienced anemia after his  nephrectomy.  GI:  The patient has had melena as above.  No emesis.  He  is hungry and like to start eating.  Prior to admission, he states that  he would be hungry, he would attempt to eat and feel not hungry soon  after starting to eat.  He would take a few bites and almost full, but  he had no bloating and no emesis.  Due to his paraplegia, he is on a  bowel regimen and has had no problems with that.  GENITOURINARY:  The  patient performs self-catheterizations due to his paraplegia.   SOCIAL HISTORY:  He is married.  His wife is at the bedside.  She is  very  supportable as well as knowledgeable regarding his care and  diagnoses.  He does not drink.  He does not smoke.  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 such as Nature conservation officer.   PAST MEDICAL HISTORY:  1. 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  2. 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  3. Anemia.  4. Pancreatic insufficiency with steatorrhea.  5. Legally blind in the left eye.   PAST SURGICAL HISTORY:  1. Right partial nephrectomy in March 2009.  2. Cervical spine surgery due to tumors.  3. Thoracic spine surgery due to tumors.  4. Medulla surgery due to tumor.  5. Removal of optic nerve tumor.   ALLERGIES:  NKDA.   MEDICATIONS AT HOME:  Baclofen, pancreatic enzymes, iron, B complex,  vitamin A, and vitamin C.  Since admission, the patient has been on the  following medications:  His usual home medications as tolerated.  He has  been started on Protonix drip and IV fluids since his urine was  abnormal.  He was started on Cipro IV.  Post-endoscopy, the Protonix  drip was also discontinued.  The Cipro was discontinued and now he has  been placed on Rocephin.  He also been started on a full liquid diet.   PHYSICAL EXAMINATION:  GENERAL:  Pleasant, articulate male patient  complaining of recent malaise and upper GI bleeding symptoms.  VITAL SIGNS:  Temperature 98.3, BP 122/67, pulse 65 and regular, and  respirations 18.  PSYCH:  The patient is alert and oriented x3.  His affect is appropriate  to current situation.  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  His gross motor  neuro exam is normal in the upper extremities.  He is flaccid in the  lower extremities.  No obvious sensation and motor function noted about  2 inches above the umbilicus.   EYES:  Left eye is abnormal due to prior tumor.  Right eye is  unremarkable with mild scleral injection.  ENT:  Ears are symmetrical.  No otorrhea.  Nose is midline.  No  rhinorrhea.  Oral mucous membranes pink and moist.  CHEST:  Bilateral lung sounds are clear to auscultation, slightly  diminished in the bases.  He is on room air.  CARDIOVASCULAR:  Heart sounds are S1 and S2 without rubs, murmurs,  thrills, or gallops.  No tachycardia.  No JVD.  No peripheral edema that  would be related to cardiac issues.  ABDOMEN:  Soft and nontender.  Bowel sounds are present.  Abdomen is  nondistended.  There is an oblique scar in the right middle to right  lower quadrant from recent nephrectomy.  There is no herniation within  the scar line.  He does have a very tiny umbilical hernia which is  mobile, nontender, and soft.  EXTREMITIES:  Flaccid in the lower extremities with associated dependent  edema, especially around the ankles.  He has some scarring on the right  lateral malleolus.  The bilateral lower extremities are symmetrical to  each other, but not to the upper extremities.  The lower extremities  have decreased tone and atrophy.  The upper extremities are normal,  otherwise.   LABORATORY DATA:  Sodium 140, potassium 3.8, CO2 of 25, glucose 105, BUN  9, and creatinine 0.92.  White count 7200, hemoglobin 11.6, and  platelets 299,000.  Hemoglobin has increased after 2 units of blood.  Urinalysis shows large amount of leukocytes, many bacteria, and wbc's 11-  20 noting that his peak white count this admission was 10,800.   DIAGNOSTICS:  EGD as reported.   IMPRESSION:  1. Upper gastrointestinal bleed secondary to friable mass in the      second portion of the duodenum.  Pathology consistent with a      neuroendocrine mass.  2. Known von Hippel-Lindau syndrome with multiple sequelae including      pancreatic insufficiency, paraplegia, blindness, etc.  3. Anemia.  4. Possible urinary  tract infection in a patient who performs self-      catheterization secondary to paraplegia.   PLAN:  1. Dr. Donell Beers will examine the patient and review the CT scans      including any information sent from the [facility].  At that point,      discuss with Gastroenterology and Oncology as to how to proceed      next.  A surgical resection is to recommend the course of action.  2. If surgery is indicated, evaluate extent to which we need to      proceed, i.e.,  just focusing on the duodenum issue versus further      exploration of pancreatic areas in the event these are also      neuroendocrine as well and may need resection.  Defer all this to      Dr. Donell Beers.  3. If surgery is indicated and the patient otherwise clinically stable      to go home, consider discharge and interval operative procedure in      1-3 weeks.   Seen and agree with above.  Likely surgery would not be curative.  Will  review scans with radiology and discuss treatment options wtih  multidisciplinary team.      Kelle Darting. Rennis Harding, N.P.      Almond Lint, MD  Electronically Signed    ALE/MEDQ  D:  08/30/2008  T:  08/31/2008  Job:  [id number]   cc:   Molly Maduro A. [name], M.D.  Rose Phi. Myna Hidalgo, M.D.  Graylin Shiver, M.D.

## 2011-04-20 NOTE — Discharge Summary (Signed)
[name], [name] NO.:  [id number]   MEDICAL RECORD NO.:  [id number]          PATIENT TYPE:  INP   LOCATION:  1417                         FACILITY:  [facility]   PHYSICIAN:  Hollice Espy, M.D.DATE OF BIRTH:  15-Mar-1961   DATE OF ADMISSION:  10/02/2008  DATE OF DISCHARGE:                               DISCHARGE SUMMARY   DATE OF DISCHARGE:  Anticipated October 06, 2008.   ATTENDING PHYSICIAN:  Hollice Espy, M.D.   PRIMARY CARE PHYSICIAN:  Elvera Lennox of [facility].   DISCHARGE DIAGNOSES:  1. Clostridium difficile colitis.  2. Urinary tract infection.  3. History of neuroendocrine tumors.  4. 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  5. Renal cell carcinoma of the right kidney ongoing.  6. Hypokalemia, now resolved.  7. Dehydration.  8. 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  9. 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   DISCHARGE MEDICATIONS:  New medicines for the patient:  1. Cipro 500 mg p.o. b.i.d. x3 days starting October 06, 2008.  2. Flagyl 500 mg p.o. every 8 hours x10 days.   The patient will resume all of his previous medications, these are as  follows:  1. Baclofen 20 p.o. q.4 h.  2. Diazepam 5-10 mg p.o. q.8 h.  3. __________ 1 mg injection p.r.n.  4. Lantus 20 units daily.  5. Regular insulin 5 units t.i.d.  6. Ranitidine 150 p.o. b.i.d.  7. Simethicone p.r.n.  8. Erythromycin 1/2 tab p.o. q.6 h.  9. Pancrease enzymes TID   HOSPITAL COURSE:  The patient is a 50 year old white male with past  medical history of neuroendocrine tumors that required  for him to have  a Whipple procedure resulting in secondary diabetes and pancreatic  insufficiency as well as paraplegia secondary to tumors invading the  spine who was hospitalized for urosepsis several weeks ago.  Since that  time he has been doing well but then started having severe diarrhea and  over the last several days he got so weak  to the point he could not take  it anymore so he came in to the emergency room on October 28 and was  found to have a white count of 28.  This was suspected to be C.  difficile colitis although his urine was positive for a urinary tract  infection.  The patient does not have a chronic Foley catheter but  actually has in and out catheterization so it is unclear if this is a  chronic or acute UTI but most likely acute UTI.  The patient was started  on IV Rocephin, IV Flagyl and C. diff cultures came back on hospital day  2 positive for C. diff and he was changed over to p.o. Flagyl and over  the next couples of days his white count normalized as of October 31  down to 5.  He was started back on p.o. medication which he seems to be  tolerating well.  His Lantus was increased as of October 30 to 10 units  and his CBGs __________ he is  not back yet on his normal regimen.  The  plan will be to monitor the patient for 1 more day and if he is baseline  we will be able to discharge him home.  He will continue on a total of 2  weeks of therapy for his C. diff, a total of 7 days of therapy for his  urinary tract infection.  Initially he was put on Rocephin because his  UTI grew out multiple colonies __________ greater than 100,000 colonies  and the fact that he does not have __________ suspect that likely this  is an acute UTI and will treat accordingly.  The patient will follow up  with his PCP, Elvera Lennox, in 1 week's time.   DISCHARGE DIET:  Will be a carb modified diet.   ACTIVITY:  Will be essentially bed rest with upper extremity since he  has paraplegia.   DISPOSITION:  He was improved and he was discharged to home.      Hollice Espy, M.D.  Electronically Signed     SKK/MEDQ  D:  10/05/2008  T:  10/05/2008  Job:  [id number]

## 2011-04-20 NOTE — H&P (Signed)
NAMERAFIQ, BUCKLIN NO.:  [id number]   MEDICAL RECORD NO.:  [id number]          PATIENT TYPE:  OIB   LOCATION:  3307                         FACILITY:  [facility]   PHYSICIAN:  Shirley Friar, MDDATE OF BIRTH:  1961/03/12   DATE OF ADMISSION:  08/28/2008  DATE OF DISCHARGE:                              HISTORY & PHYSICAL   REASON FOR ADMISSION:  GI bleed.   HISTORY OF PRESENT ILLNESS:  [name] is a pleasant 50 year old white  male with history of von Hippel-Lindau syndrome, who is paraplegic  secondary to vHL, and has a history of right partial nephrectomy due to  his vHL.  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  He  was seen in our office today due to a 5-day history of black stools and  dark blood per rectum with lightheadedness and dizziness.  He denied any  nausea or vomiting or associated abdominal pain.  He was sent out to the  hospital for upper endoscopy and this has shown a large duodenal mass in  the second portion of the duodenum, which is ulcerated and friable.  Biopsies were done of this mass; and during the procedure, he developed  bleeding from biopsy sites requiring 3 mL of epinephrine injection.  This mass was noted in May 2009 on an upper endoscopy done by Dr. 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  He also had a  colonoscopy done in May 2009, which was unremarkable.   PAST MEDICAL HISTORY:  1. Von Hippel-Lindau syndrome.  2. Status post right partial nephrectomy secondary to vHL.  3. Pancreatic insufficiency.  4. Muscle spasms.  5. 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  6. History of palpitations.  7. Status post surgery of his optic nerve.  8. Status post spinal surgery.  9. Status post skin graft.  10.Status post brain stem surgery.   CURRENT MEDICATIONS:  Baclofen 20 mg p.o. 6 times a day; vitamin C,  A,  and multivitamins; iron pills; and Creon 24,000 units 3 pills t.i.d.   ALLERGIES:  No known drug allergies.   FAMILY HISTORY:  Noncontributory.   SOCIAL HISTORY:  Negative.   REVIEW OF SYSTEMS:  Negative from a GI standpoint except as stated  above.   PHYSICAL EXAMINATION:  VITAL SIGNS:  Temperature 97.4, pulse 52, and  blood pressure 80/48.  Weight unable obtain, the patient was in a  wheelchair.  GENERAL:  Alert, in no acute distress.  ABDOMEN:  Soft, nontender, and nondistended.  Active bowel sounds.   IMPRESSION:  A 50 year old white male with von Hippel-Lindau syndrome  presenting with upper GI bleed with upper endoscopy showing a large  duodenal mass in the second portion of his duodenum concerning for  malignancy.  Biopsies were subsequently done, which led to requirement  for injection of epinephrine for hemostasis.  The patient will be  admitted to the hospital on step-down unit with IV fluids and blood  transfusion.  His initial hemoglobin is 9.8 which is down from  12.8 in  mid August 2009.  The patient will also be placed on Protonix continuous  drip and given IV fluids.  If his biopsies are negative from his mass,  the patient may need to have an endoscopic ultrasound or CT scan to  further evaluate this lesion.      Shirley Friar, MD  Electronically Signed     VCS/MEDQ  D:  08/28/2008  T:  08/29/2008  Job:  [id number]   cc:   Everardo All. [name], M.D.  Robert A. [name], M.D.

## 2011-04-20 NOTE — H&P (Signed)
NAMERAYDIN, BIELINSKI NO.:  [id number]   MEDICAL RECORD NO.:  [id number]          PATIENT TYPE:  INP   LOCATION:  0108                         FACILITY:  [facility]   PHYSICIAN:  Hollice Espy, M.D.DATE OF BIRTH:  [birth year]   DATE OF ADMISSION:  10/02/2008  DATE OF DISCHARGE:                              HISTORY & PHYSICAL   PRIMARY CARE PHYSICIAN:  Dr. Elvera Lennox, PA, of [facility]  at [facility].   CHIEF COMPLAINT:  Diarrhea.   HISTORY OF PRESENT ILLNESS:  The patient is a 47-year white male with  past medical history of specifically von Hippel-Lindau syndrome leading  to paraplegia as well as secondary renal tumors, recent diagnosis of  diabetes mellitus and a recent hospitalization for urosepsis.  He has  been home, and over the last several days has developed worsening  diarrhea to the point his wife became concerned and brought him into the  emergency room.  In the emergency room, the patient had labs checked.  He was found to have a large UTI with many bacteria, 21-50 white cells,  and large leukocyte esterase.  Concerning also was his white count which  was noted to be 28.2 with a 90% shift.  When compared to his labs a  month ago, his white count was only 8.1.  The patient then underwent a  CT scan of the abdomen and pelvis to look for signs of possible  obstruction.  What they noted were multiple lesions in the liver and  left kidney as well as status post a Whipple procedure, all of which  were known, but new findings were thickening of the rectosigmoid colon  concerning for rectosigmoid colitis felt to be infectious versus  inflammatory.  The patient currently is doing okay.  He complains of  some occasional hiccups and mild spasm, but otherwise he seems to be  doing relatively well.  He denies any headaches, vision changes,  dysphagia, no chest pain, palpitations, no shortness of breath, wheeze,  cough.  He does not have any intact  sensation from T5 down.  No  hematuria.  He does have diarrhea but no constipation, no focal  extremity numbness, weakness or pain acutely.   REVIEW OF SYSTEMS:  Otherwise negative.   PAST MEDICAL HISTORY:  Includes a history von Hipple-Lindau syndrome,  status post Whipple procedure, status post tumors in the left and right  kidney, status post a partial right nephrectomy, secondary paraplegia  because of tumors invading his spine, history of left eye removal,  history of brain stem surgery, status post benign medullary tumor  resection, history of pancreatic tumors, history of new onset diabetes  mellitus.   MEDICATIONS:  He is on Lantus 25 in the morning, baclofen 20 q.3 h.,  pancreatic enzymes t.i.d. with food, iron, B complex, vitamin A, vitamin  C and p.r.n. Valium.   ALLERGIES:  To IV CIPRO which causes a rash.   SOCIAL HISTORY:  No tobacco, alcohol or drug use.   FAMILY HISTORY:  Noncontributory.   PHYSICAL EXAMINATION:  VITAL SIGNS:  The patient's vitals on admission  reveal temperature 98.6, heart rate 104, now down to 94, blood pressure  96/52, now up to 138/98, respirations 18, O2 saturation 98% on room air.  GENERAL:  Patient is alert and oriented x3, in no apparent distress.  HEENT:  Normocephalic.  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  Mucous membranes are dry.  NECK:  He has no carotid bruits.  HEART:  Regular rate and rehydration, S1, S2.  LUNGS:  Clear to auscultation bilaterally.  ABDOMEN:  Soft, nontender, nondistended.  Hypoactive bowel sounds.  EXTREMITIES:  No clubbing, cyanosis or edema.   LABORATORY WORK:  White count 20.2, hemoglobin and hematocrit 10.3 and  32 respectively, MCV of 84, platelet count 393.  Sodium 139, potassium  4.4, chloride 102, bicarb 26, BUN 23, creatinine 1.01, glucose 172.  LFTs are noted for an albumin of 2.5.  UA:  Note the patient does not  have a chronic Foley, in-and-out cath.  He has nitrite positive,  large  leukocyte esterase, moderate hemoglobin, trace ketones, with many  bacteria, 21-50 white cells.  He has a 90% shift on differential.   ASSESSMENT:  1. Urinary tract infection.  2. Colitis, possibly Clostridium difficile.  3. Diabetes mellitus.  4. Paraplegia.  5. Known renal tumors.   PLAN:  We will treat the patient with IV Rocephin and Flagyl for  possible C. diff as well as a UTI.  Check C. diff cultures, IV Ativan  and baclofen for his paraplegia and spasms, sliding scale for now with  just 5 of Lantus daily.  Will continue to follow.  Place the patient on  step-down tonight.      Hollice Espy, M.D.  Electronically Signed     SKK/MEDQ  D:  10/02/2008  T:  10/02/2008  Job:  [id number]   cc:   C. Elvera Lennox, PA-C

## 2011-04-20 NOTE — H&P (Signed)
[name], ESKEW NO.:  [id number]   MEDICAL RECORD NO.:  [id number]          PATIENT TYPE:  EMS   LOCATION:  ED                           FACILITY:  [facility]   PHYSICIAN:  Ramiro Harvest, MD    DATE OF BIRTH:  20-Apr-1961   DATE OF ADMISSION:  04/03/2009  DATE OF DISCHARGE:                              HISTORY & PHYSICAL   PRIMARY CARE PHYSICIAN:  Dr. [name] of [facility].   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  The patient's wife stated that she found the patient  unresponsive around 7:15 this morning and checked his CBG and it was 38.  Wife gave the patient 1 mg of glucagon and called EMS.  When EMS got  there, the patient was put on D50.  Repeat CBG was 239.  The patient  started to be a little more responsive and groggy and brought to the ED.  The patient denied any fevers.  No chills, no nausea or vomiting or  diarrhea.  No abdominal pain, no chest pain, no focal neurological  deficits.  No cough, no dysuria, no melena, no hematemesis.  No  hematochezia.  The patient does endorse some generalized weakness, good  oral intake, and occasional shortness of breath, flushing and  palpitations which he attributes to metastatic disease to the liver.  Per patient and family, the patient had gotten some octreotide 2 weeks  prior to admission for his liver mets per  oncology.  Per the patient and  his wife over the past 4-5 days, the patient has had fasting CBGs in the  50s to the 60s.  The patient was seen in the ED and found to have a  temperature of 91.8.  The patient was placed in the bear hugger and  placed on IV fluids.  CBC done was within normal limits.  Comprehensive  metabolic profile with an AST, ALT of 102, otherwise was within normal  limits.  Chest x-ray was negative.  EKG with normal sinus rhythm with  slight ST elevation in leads II, III, and aVF.  The patient was started  on D50 and IVF.  We were called to admit the patient for further  evaluation and management.   HOME MEDICATIONS:  Baclofen 20 mg p.o. q.4 h., diazepam 5-10 mg p.o.  p.r.n., glucagon injection 1 mg as needed, Lantus 25 units  subcutaneously q.h.s., Humalog as sliding scale insulin, with carb  counting, Creon-24 three tablets t.i.d. with meals,  Avastin intraocular  injection to the eyes q. monthly, Imodium q.6 h.   SOCIAL HISTORY:  The patient is married.  Prior tobacco history, quit 6  months prior to admission.  Occasional rare alcohol use.  No IV drug  use.  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   FAMILY HISTORY:  Noncontributory.   REVIEW OF SYSTEMS:  As per HPI, otherwise negative.   PHYSICAL EXAMINATION:  Temperature 91.8 rectally, up to 97.5, blood  pressure 131/68, pulse of 50, respirations 16, satting 100% on room air.  GENERAL:  The patient is alert and talking and very cooperative and  choking occasionally.  HEENT: Normocephalic, atraumatic.  Left eye with a prosthesis.  Right  eye equal, round, and reactive to light and accommodation.  Extraocular  movements intact.  Oropharynx is clear.  No lesions, no exudates.  NECK:  Supple.  No lymphadenopathy.  RESPIRATORY:  Lungs are clear to  auscultation bilaterally.  CARDIOVASCULAR:  Regular rate and rhythm.  No murmurs, rubs or gallops.  ABDOMEN:  Soft, nontender, nondistended.  Positive bowel sounds.  EXTREMITIES:  No clubbing, cyanosis or edema.  The patient is  paraplegic.  NEUROLOGICAL:  The patient is alert and oriented x3.  Cranial nerves II-  XII are grossly intact.  No focal deficits.  The patient is paraplegic.   LABORATORY DATA:  Sodium 139, potassium 4.5, chloride 105, bicarb 29,  BUN 28, creatinine 0.66, glucose of 245, bilirubin 0.6, alk phosphatase  93, AST 102, ALT 102, calcium 8.8, albumin 3.2, protein of 6.6.  Chest x-  ray:  No acute cardiopulmonary disease.  EKG with normal sinus rhythm  with a slight ST elevation in II, III, and aVF.  CBC with a white count  of 4.9, hemoglobin 14.9, platelets of 183, hematocrit 45.0, ANC of 4.1.   ASSESSMENT AND PLAN:  [name] is a 50 year old gentleman with a  history of von Hippel-Landau syndrome with neuroendocrine tumors.  He is  status post modified Whipple with total pancreatectomy with prior  history of multiple admissions for Clostridium difficile colitis also  and insulin-dependent diabetes and receiving octreotide secondary to  liver mets.  1. Altered mental status likely secondary to hypoglycemic spell.      After the patient's hypoglycemia was corrected, the patient is      alert and oriented post D50.  See problem number 2.  To follow.  2. Hypoglycemia likely secondary to increased Lantus in the setting of      octreotide.  We will panculture the patient to rule out any      infectious etiology.  Will decrease home dose of Lantus to 15 units      daily and sliding scale insulin.  Will check CBGs every 2 hours for      the next 24 hours and adjust accordingly.  We will consult with      endocrinology for further evaluation and recommendations.  3. Hypothymia likely secondary to problem number 2 versus an      infectious etiology.  We will  panculture the patient.  Will place      on the bear hugger and follow and decrease Lantus to 15 units      subcutaneously and sliding scale insulin and follow.  See problem      number 2.  4. Von Hippel-Landau syndrome  with multiple sequelae, status post      Whipple with a total pancreatectomy and type 2 diabetes with      metastatic disease.  Will continue Creon for now and monitor.  5. Type 2 diabetes secondary to problem number 4.  Decrease Lantus to      15 units, sliding scale insulin.  Check      CBGs q.2 h. times 24 hours and adjust.  6. Prophylaxis.  Protonix for GI prophylaxis.  Lovenox for DVT      prophylaxis.   It has been a pleasure taking care of Mr. Violet Cart.      Ramiro Harvest, MD  Electronically Signed     DT/MEDQ  D:  04/03/2009  T:  04/03/2009  Job:  [id number]   cc:   Molly Maduro A. [name], M.D.  Fax: [phone]   Dorisann Frames, M.D.  Fax: [phone]   Rose Phi. Myna Hidalgo, M.D.  Fax: [phone]

## 2011-04-20 NOTE — Discharge Summary (Signed)
[name], [name] NO.:  [id number]   MEDICAL RECORD NO.:  [id number]          PATIENT TYPE:  INP   LOCATION:  1417                         FACILITY:  [facility]   PHYSICIAN:  Ramiro Harvest, MD    DATE OF BIRTH:  Mar 13, 1961   DATE OF ADMISSION:  04/03/2009  DATE OF DISCHARGE:  04/05/2009                               DISCHARGE SUMMARY   ATTENDING PHYSICIAN:  Ramiro Harvest, MD.   PRIMARY CARE PHYSICIAN:  [name]. [name], M.D. of [facility].   ENDOCRINOLOGIST:  Dorisann Frames, M.D.   DISCHARGE DIAGNOSES:  1. Hypoglycemia.  2. Hypothermia secondary to problem #1.  3. Probable urinary tract infection versus colonization.  4. Type 2 diabetes.  5. Altered mental status secondary to problem #1, resolved.  6. History of Von Hippel-Lindau syndrome diagnosed at age 57.  7. 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  8. Anemia.  9. Pancreatitis status post Whipple procedure with total      pancreatectomy with steatorrhea.  10.Legally blind in the left eye.  11.Status post right partial nephrectomy in March 2009.  12.Cervical spine surgery due to tumors.  13.Thoracic spine surgery due to tumors.  14.Medullary surgery due to tumors.  15.Removal of optic nerve tumor.  16.Gastrointestinal bleed secondary to neuroendocrine carcinoid of the      duodenum.  17.Metastatic liver disease.  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   DISCHARGE MEDICATIONS:  1. Lantus 12 units subcu daily.  2. Humalog sliding scale three times daily.  3. Creon 24 three tablets three times a day with meals.  4. Diazepam 5 mg 1-2 tablets p.r.n.  5. Baclofen 20 mg p.o. q.4 h.  6. Imodium every 6 hours.  7. Glucagon 1 mg IM as needed.  8. Avastin intraocular every month.   DISPOSITION AND FOLLOWUP:  The patient will be discharged home.  The  patient is to follow up with Dr. Talmage Nap, his endocrinologist, early next  week to reassess his diabetes and insulin regimen.  The patient is also  to follow up with his PCP as scheduled.   CONSULTATIONS:  None.   PROCEDURES PERFORMED:  Chest x-ray was done on April 03, 2009, that  showed no acute cardiopulmonary disease.   BRIEF ADMISSION HISTORY AND PHYSICAL:  [name] is a 50 year old  white gentleman with history of Von Hippel-Lindau syndrome and  neuroendocrine tumor, status post modified Whipple with total  pancreatectomy in October of 2009 at [facility].  Also a history of tumors  to the C and T spines status post treatment and resection, also history  of GI bleed secondary to neuroendocrine carcinoid tumor and also type 2  diabetes on chronic insulin therapy, history of right eye optic nerve  tumor, on Avastin intraocular injections monthly at [facility] who presents to  the ED with altered mental status secondary to hypoglycemic spell with a  CBG of 38 and hypothermia.  The patient's wife stated that she found the  patient unresponsive approximately 7:15 on the morning of admission,  checked his CBG  and it was 38.  The patient's wife gave him a mg of  glucagon and then called EMS.  When EMS got there the patient was given  some D50 and repeat CBG was 239.  The patient started to be a little  more responsive and groggy and was thus brought to the ED.  The patient  denied any fevers, no chills, no nausea, no vomiting, no diarrhea, no  abdominal pain, no chest pain, no focal neurological deficits, no cough,  no dysuria, no melena, no hematemesis, no hematochezia.  The patient  does endorse some generalized weakness, good oral intake, occasional  shortness of breath, flushing and palpitations which he attributes to  metastatic disease of the liver.  Per patient and family the patient had  gotten some octreotide 2 weeks prior to admission for his liver mets per  oncology.  Per the patient and his wife over the past 4-5 days the  patient has had fasting  CBGs of 50s to 60s.  The patient was seen in the  ED and found to have a temperature of 91.8.  The patient was placed on  the Bair hugger and placed on IV fluids.  CBC done was within normal  limits.  CMET done with an AST of 102 and an ALT of 102, otherwise was  within normal limits.  Chest x-ray was negative.  EKG with normal sinus  rhythm.  Slight ST elevation in leads II, III and aVF.  The patient was  started on D50 and IV fluids.  We were called to admit the patient for  further evaluation and management.   PHYSICAL EXAM:  VITAL SIGNS:  Temperature 91.8 rectally up to 97.5,  blood pressure 131/68, pulse of 50, respirations 16, satting 100% on  room air.  GENERAL:  The patient was alert and talking, very cooperative and joking  occasionally.  HEENT:  Normocephalic, atraumatic.  Left eye with a prosthesis.  Right  eye equal, round and reactive to light and accommodation.  Extraocular  movements intact.  Oropharynx is clear.  No lesions.  No exudates.  NECK:  Neck was supple.  No lymphadenopathy.  RESPIRATORY:  Lungs were clear to auscultation bilaterally.  CARDIOVASCULAR:  Regular rate and rhythm.  No murmurs, rubs or gallops.  ABDOMEN:  Abdomen was soft, nontender, nondistended, positive bowel  sounds.  EXTREMITIES:  No clubbing, cyanosis or edema.  The patient is  paraplegic.  NEUROLOGICAL:  The patient was alert and oriented x3.  Cranial nerves II-  XII were grossly intact.  No focal deficits.   ADMISSION LABS:  Sodium 139, potassium 4.5, chloride 105, bicarb 29, BUN  28, creatinine 0.66, glucose of 245, bilirubin of 0.6, alk phosphatase  of 93, AST of 102, ALT of 102, calcium of 8.8, albumin 3.2, protein 6.6.  Chest x-ray no acute cardiopulmonary disease.  EKG with normal sinus  rhythm with slight ST elevation in II, III and aVF.  CBC with a white  count of 4.9, hemoglobin 14.9, platelets of 183, hematocrit 45.0, ANC of  4.1.   HOSPITAL COURSE:  1. Hypoglycemia.  The  patient was brought in with altered mental      status which was felt to be secondary to hypoglycemia.  The      patient's CBG at home was 38 and was given some D50 per EMS and      brought into the ED.  The patient was admitted.  He was pancultured      to  rule out any infectious etiology which came back negative.  The      patient did have a UA that was positive for urinary tract      infection.  Urine cultures were pending at the time.  However, the      patient remained afebrile with a normal white count and the patient      refused IV Rocephin secondary to multiple episodes of C. diff      colitis.  The patient's hypoglycemia was felt to be secondary to      his Lantus dose in the setting of octreotide.  The patient's Lantus      was decreased from his home dose of 25-15 units daily and placed in      the step-down unit for 1 day.  The patient remained stable.  The      patient's CBCs remained stable in the 100s to 200s and the patient      was monitored.  The patient was then transferred from the step-down      unit to a telemetry unit where he was maintained on 15 units of      Lantus daily as well as a sliding scale.  The patient's CBG was      initially checked every 2 hours on admission and then changed to      every 4 hours and then changed to a.c. and h.s.  The patient      remained stable.  On the morning of May 1 the patient did have a      CBG of 52 however remained asymptomatic and was given 4 ounces of      juice with a subsequent CBG of 73.  The patient remained in a      stable and improved condition and wanted to be discharged home.      The patient will be discharged home on a decreased dose of Lantus      at 12 units daily as well as his sliding scale insulin and to      follow up closely with his endocrinologist early next week.  The      patient will be discharged in a stable and improved condition.  2. Altered mental status.  See problem #1.  3. Hypothermia likely  secondary to the patient's hypoglycemic spells.      The patient was pancultured to rule out any etiologies of      infectious etiologies.  Chest x-ray was negative.  Urinalysis which      was done was positive for urinary tract infection.  However, urine      cultures were pending at the time of discharge.  The patient during      the hospitalization refused to be placed on any further IV      antibiotics secondary to his multiple recurrent episodes of C. diff      colitis.  The patient remained afebrile with a normal white count      and the patient was not placed on Rocephin.  The patient was      monitored and the patient was also placed on a Bair hugger on      admission with subsequent resolution of his hypothermia.  The      patient will be discharged home in a stable and improved condition.  4. Probable urinary tract infection versus colonization.  When the      patient was admitted the patient was pancultured to rule  out any      sources of infection.  Urinalysis which was done came back positive      for urinary tract infection with positive nitrite, small      leukocytes, WBCs 7-10.  However, the patient remained afebrile with      a normal white count.  The patient refused to be treated with any      IV antibiotics secondary to his multiple episodes of C. diff      colitis.  The patient remained in stable condition and will be      discharged home.  It could be likely that the patient's urinalysis      was positive secondary to colonization.  The patient's urine      cultures will need to be followed up per PCP and the patient will      be discharged in a stable condition.   The rest of the patient's chronic medical issues were stable throughout  the hospitalization and the patient will be discharged in a stable and  improved condition.   DISCHARGE VITALS:  On day of discharge vital signs, temperature 97.4,  blood pressure 120/82, pulse of 60, respirations 20, satting 100%  on  room air.  CBGs ranged from 52-200.   DISCHARGE LABS:  Sodium 136, potassium 3.6, chloride 99, bicarb 33, BUN  22, creatinine 0.82, glucose of 51, calcium of 8.6.  CBC with a white  count of 5.5, hemoglobin of 13.0, platelets of 205, hematocrit of 38.9,  ANC of 1.9, hemoglobin A1c of 6.7.   It was a pleasure taking care of Mr. Conn Trombetta.      Ramiro Harvest, MD  Electronically Signed     DT/MEDQ  D:  04/05/2009  T:  04/05/2009  Job:  [id number]   cc:   Dorisann Frames, M.D.  Fax: [phone]   [name]. [name], M.D.  Fax: [phone]   Rose Phi. Myna Hidalgo, M.D.  Fax: [phone]

## 2011-04-23 NOTE — Discharge Summary (Signed)
NAMEDRU, PRIMEAU NO.:  [id number]   MEDICAL RECORD NO.:  [id number]          PATIENT TYPE:  INP   LOCATION:  5019                         FACILITY:  [name]   PHYSICIAN:  [name], M.D.   DATE OF BIRTH:  [birth year]   DATE OF ADMISSION:  08/28/2008  DATE OF DISCHARGE:  09/01/2008                               DISCHARGE SUMMARY   [name] was admitted to [name] on August 28, 2008  for Murphy bleed.  He was discharged from [name] on September 01, 2008.   DISCHARGE DIAGNOSES ARE AS FOLLOWS:  1. Gastrointestinal bleed secondary to neuroendocrine carcinoid of the      duodenum.  2. Urinary tract infection.   The rest of his discharge diagnoses are consistent with his past medical  histories and include von Hippel-Lindau syndrome and the rest of his  diagnoses are secondary to the von Hippel-Lindau syndrome and include  paraplegia, palpitations.  He is status post partial nephrectomy in  April 2009, is status post C5-C6 surgery and tumor removal, left eye  surgery.  He has a glass replacement for his left eye, T5-T6 thoracic  spine surgery, and brainstem surgery including a benign medullary tumor  resection.  He also has pancreatic tumors.   PROCEDURES:  August 28, 2008, the patient had an upper endoscopy done  by Dr. Charlott Murphy who found a large ulcerated mass along the  second portion of the duodenum.  He biopsied the mass and afterwards the  patient required epinephrine injections in order to stop the bleeding.  Path of the biopsies and confirmatory stain indicated that this was a  neuroendocrine carcinoid tumor.   CONSULT:  Dr.  Arlan Murphy saw the patient and followed along during  his hospitalization.  Dr. Almond Murphy saw the patient on Friday evening  and agreed to follow him as an outpatient.   HISTORY AND BRIEF Murphy COURSE:  [name] is a pleasant 50-year-  old white male with a history of von  Hippel-Lindau syndrome who was a  paraplegic secondary to vHL.  He was seen in the [name]  Office on August 28, 2008 due to his 5-day history of black stools  and dark red blood per rectum.  He was also lightheaded and dizzy.  He  denied any nausea, vomiting, or associated abdominal pain.  He was sent  to the Murphy for an upper endoscopy and a large duodenal mass was  found, it was ulcerated and friable and biopsies were obtained.  He  developed bleeding from his biopsy site, and require a 3 mL of  epinephrine injection to obtain homeostasis.  This mass was also noted  on May 2009 on an upper endoscopy by [name].  Biopsy at that  time showed chronic duodenitis.  He also had a colonoscopy done in May  2009, which was unremarkable.  During his Murphy course, the patient  was admitted, placed on IV Protonix infusion and received 2 units of  packed red blood cells.  He is extremely sensitive to a drop in  hemoglobin such that if his hemoglobin drops below 10, he becomes very  dizzy and near syncopal.  His hemoglobin was transfused up to about 11  and he did fine.  After the transfusions, he maintained his hemoglobin  for the rest of the Murphy stay.  He did continue to have black stools  for several days, but by the time of discharge, they have become brown.  Path returned on August 29, 2008.  Confirmatory stains returned on  August 30, 2008, this conclude that he had a neuroendocrine carcinoid  tumor of his duodenum that was likely associated with his von Hippel-  Lindau syndrome and during his stay, he was seen by Oncology who said  they would be happy to follow along with [place], but there was not much they  could do in this circumstance.  He was also seen by [name] who  wanted to discuss this case with other surgeons and see the patient as  an outpatient.  The patient improved quickly, his bleeding slowed, we  were able to advance his diet and on  August 31, 2008, he was  tolerating a full diet.  He was discharged on September 01, 2008 in good  condition to home with the following instructions, please call [name]  with further bleeding or in increased in pain.  He also has a followup  appointment with [name] on September 17, 2008 with [name] at  1:45.  He has a followup appointment with Dr. Almond Murphy on September 06, 2008 to discuss next step through surgical perspective.  He was  discharged to home in stable condition.   His discharge medications included;  1. Baclofen 20 mg q.3 h.  2. Pancreatic enzymes 3 times daily with food.  3. Iron.  4. B complex.  5. Vitamin A.  6. Vitamin C.      Stephani Police, PA    ______________________________  [name], M.D.    MLY/MEDQ  D:  09/02/2008  T:  09/03/2008  Job:  [id number]   cc:   Everardo All. [name], M.D.  Scott Lint, MD  Edrick Oh, M.D.  Arlana Pouch, M.D.

## 2011-04-23 NOTE — Op Note (Signed)
[place]. [facility]  Patient:    [name], [name]                       MRN: [id number] Proc. Date: 03/13/01 Adm. Date:  [id number] Disc. Date: [id number] Attending:  Melody Comas.                           Operative Report  PREOPERATIVE DIAGNOSIS:  Left ischial pressure ulcer.  POSTOPERATIVE DIAGNOSIS:  Left ischial pressure ulcer.  PROCEDURES: 1. Debridement of left ischial pressure ulcer in preparation for a    musculocutaneous flap closure, with partial ostectomy. 2. Closure of left ischial defect with left V-Y hamstring musculocutaneous    advancement flap.  SURGEON:  Janet Berlin. Dan Humphreys, M.D.  FIRST ASSISTANT:  Teena Irani. Odis Luster, M.D.  ANESTHESIA:  General.  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  Over the last several months, he had developed an area of skin breakdown over the left ischium, which has deepened to become an ulcer extending all the way down to and involving the bone.  He was admitted over the weekend for high, spiking fevers.  He has defervesced over the last 24-36 hours and is taken to the operating room at this time for excision of his large left ischial pressure ulcer defect and planned closure with a V-Y hamstring advancement flap.  FINDINGS:  The patient was indeed found to have a very extensive left ischial ulcer.  This defect was successfully closed as planned preoperatively with a V-Y hamstring advancement flap.  DESCRIPTION OF PROCEDURE:  The patient was greeted in the preoperative holding area and then brought to the operating room.  After successful induction of general endotracheal anesthesia on his transport gurney, he was placed on the operating room table in the prone position with all bony and soft tissue prominences carefully padded.  The patient already had an indwelling Foley urinary catheter, which was connected to  closed gravity drainage.  The operative field was sterilely prepped with Betadine scrub and paint from the lower portion of the back to involve both buttocks and the proximal portion of the left lower extremity down to the level of the calf.  The procedure was begun by using a #10 blade to incise around the margins of the ischial defect.  The electrocautery was then used to excise the margins of the ischial bursa cavity in a cylindrical fashion.  As expected, this cavity widened as it proceeded more deeply.  Dissection was quite difficult due to the size of the cavity.  In order to aid visualization, I found that I had to resect the bursa cavity in a somewhat piecemeal fashion so as to proceed in a safe manner.  Ultimately, the bottom of the bursa cavity was exposed.  I used a curved osteotome to tangentially remove a portion of the exposed ischium. At this point, all bleeding had been well-controlled, the bursa cavity completely excised, and the exposed portion of the bone sharply debrided.  I used the pulsatile lavage system to copiously irrigate the wound.  At this point, the defect appeared to be ready for closure with the musculocutaneous flap.  All instruments that had been used to this point were discarded, and a change of gloves was effected for members of the operating team.  I then designed a V-shaped defect on the posterior  portion of the thigh with the point of the V located over the upper portion of the popliteal fossa.  An intact skin bridge superiorly and medially was preserved so as to not disrupt any of the perforators from the medial femoral circumflex system.  A #10 blade was used to incise along the lines of the skin markings and dissection deepened through the subcutaneous layers with electrocautery until the fascia was exposed.  The fascia was exposed in the base of the wound in line with the surface incision. Distally, I divided the semitendinosus and  semimembranosus tendons medially and the biceps femoris tendon laterally.  The sciatic nerve was identified visually and carefully protected.  The distal portion of the flap was elevated so as to allow the entirety of the flap to be mobilized superiorly and medially.  Care was taken to avoid injury to any of the identified perforators.  I was ultimately able to transpose the muscle flap proximally in a sufficient fashion to obliterate the large ischial cavity, which approximated the size of a fist.  I then detached the posterior thigh musculature from the ischium.  I found that I was able to elevate this musculature over the ischium concomitantly with advancement of the V-Y flap so as to obliterate the ischial cavity. After copiously irrigating all wounds and reconfirming hemostasis, I used interrupted 0 Vicryl sutures in figure-of-eight fashion to suture the musculature of the posterior thigh up and over the exposed ischium to the musculature of the gluteal fold just above the open ischial defect.  Prior to completing complete coverage of the bone, a 10 mm, fully-fluted Blake drain was inserted through a separate stab incision laterally and directed deep to the sutured muscles.  This drain was secured externally with a single interrupted suture of 3-0 monofilament Prolene.  The ischial defect was further closed in a more superficial plane with additional interrupted sutures of 0 and 2-0 Vicryl.  The skin margins of the donor site defect were then reapproximated with skin staples.  The flap had been advanced at this point, and the distal defect was closed in a Y-shaped fashion.  Interrupted, buried dermal sutures of 2-0 Vicryl were placed throughout the extent of all wounds for dermal edge approximation, sequentially removing the staples in the process.  I then completed wound closure with the use of skin staples.  I should note that a second drain was placed through a separate stab  incision on the lateral aspect of the thigh and directed into the donor site defect just prior to completing closure.  All the wounds were liberally coated with bacitracin ointment and then covered  with sterile ABD pads, which were lightly taped into position.  The patient was then placed on his air-flotation bed in the supine position and general anesthesia successfully reversed.  He was extubated and subsequently transported to the postanesthesia care unit, leaving the operating room in stable and satisfactory condition.  Although there was a slight period of hypotension upon induction of anesthesia at the beginning of the case, the patient very quickly normalized and had no further problems intraoperatively.  After a short stay in the postanesthesia care unit, he will subsequently be taken back to his hospital room in his air-flotation bed, where he will remain for approximately three weeks to allow the flap to suitably heal before beginning to apply direct pressure with the patient in a sitting position. DD:  03/15/01 TD:  03/15/01 Job: [id number]

## 2011-04-23 NOTE — Consult Note (Signed)
[place]. [facility]  Patient:    Scott, Murphy                       MRN: [id number] Proc. Date: 03/08/01 Adm. Date:  [id number] Disc. Date: [id number] Attending:  Melody Comas.                          Consultation Report  CHIEF COMPLAINT:  Left ischial pressure ulcer.  HISTORY OF PRESENT ILLNESS:  [name] is a 50 year old young man who I was requested to see in regards to a left ischial pressure ulcer. [name] has von Hippel-Lindau disease of the spine and has lower extremity paraplegia and near-total anesthesia as a result. He recently developed an area of wound breakdown over the left ischium which has subsequently deepened to become a chronic, draining, poorly healing wound. He is seen in regards to surgical management of this wound.  The patient tells me that he has never had to have surgery before for a pressure ulcer. He did have an area of wound breakdown over his ankle previously related to a poorly fitting shoe on a long trip.  PAST MEDICAL HISTORY:  The patient suffers from chronic spasms of the lower extremity. He also has had to have multiple urologic interventions and is seeing Maretta Bees. Vonita Moss, M.D. for these.  SOCIAL HISTORY:  He is employed by [employer]. His spouse works at [employer] on the [address] ward.  PHYSICAL EXAMINATION:  Pertinent physical examination is limited to the area of the chief complaint.  EXTREMITIES:  The patient does have an approximately 2 cm defect over the left ischium. I am able to palpate the underlying ischium. There is no evidence of purulent drainage, although there is some serous material on the dressing that I removed. There is no particularly foul odor associated.  IMPRESSION:  Left ischial pressure ulcer.  PLAN:  We will arrange to debride and close the ulcer with a V-Y hamstring advancement flap. This will require at least a 3-week hospital admission with all of the  time postoperatively spent in an air flotation bed. Due to a number of comorbid issues, I have asked [name] and his wife to arrange to have his primary physician admit him and to manage these nonsurgical problems on an ongoing basis. I will follow him postoperatively on a frequent basis to ensure that his wounds are healing satisfactorily and throughout the postoperative period until complete wound healing is obtained.  I have emphasized to him and his wife how important it will be to avoid unrelieved pressure on any soft or bony promontory of the trunk, pelvis, and lower extremities for the rest of his life.DD:  03/10/01 TD:  03/10/01 Job: [id number]

## 2011-04-23 NOTE — Discharge Summary (Signed)
[place]. [facility]  Patient:    [name], [name]                       MRN: [id number] Adm. Date:  [id number] Disc. Date: [id number] Attending:  Melody Comas.                           Discharge Summary  HOSPITAL COURSE:  Omid Deardorff is a 50 year old young man who is paraplegic as a result of spinal surgery for von Hippel-Lindau disease.  He was admitted to the service of Dr. Larey Dresser on March 09, 2001, in presumed urosepsis.  I had seen him as an outpatient prior to admission and had planned to perform an elective repair of an open left ischial pressure ulcer.  After stabilization from the standpoint of his urosepsis and resolution of high spiking fevers, he was taken to the operating room for debridement and closure of his left ischial ulcer.  This procedure was performed on March 13, 2001, by Dr. Dan Humphreys. The ulcer was closed with a left V-Y hamstring advancement musculocutaneous flap.  The patient was brought back to the ward and maintained in an air-flotation bed.  His initial postoperative course from this procedure was essentially benign.  Discharge was anticipated approximately three weeks after this procedure in order to allow his wound to develop sufficient strength to permit activities of daily living at home.  However, he developed a small, approximately 1 cm in diameter, superficial erosion over the scar superiorly. This was first noted about 16 days after his initial flap surgery.  Over the next couple of days, this progressively deepened and was noted to communicate with a deep ischial cavity by probing it with a sterile swab.  The patient was returned to the operating room on March 31, 2001.  He underwent debridement (excisional), pulsatile lavage, and reclosure of his left ischial wound.  Details of this procedure may be found in a separately dictated operation report.  He was once again returned to the ward postoperatively and placed  back on an air-flotation bed.  From this point on, his course was entirely uneventful.  Both of his drains placed at the time of his surgery on March 31, 2001, were discontinued by the time of his discharge. Additionally, all sutures and staples have been removed.  At the time of his discharge on Apr 19, 2001, the wound appears to be completely closed, and he is otherwise physiologically stable.  FINAL DIAGNOSES: 1. Urosepsis, treated. 2. Left ischial pressure ulcer.  PROCEDURES: 1. Excisional debridement and closure of left ischial pressure ulcer with left    posterior thigh V-Y hamstring advancement musculocutaneous flap on March 13, 2001. 2. Reexploration of open ischial wound, excisional debridement, and closure    with readvancement of left posterior thigh V-Y hamstring advancement flap    on March 31, 2001.  DISPOSITION:  The patient is discharged home.  DISCHARGE INSTRUCTIONS:  His wife is a physical therapist and will be ______ involved with his postoperative care.  At the time of his discharge, all antibiotics have been discontinued.  I will see him in follow-up in approximately two weeks.  He knows to call the office sooner should he experience any problems related to his surgical wound. DD:  04/19/01 TD:  04/19/01 Job: [id number]

## 2011-04-23 NOTE — Op Note (Signed)
[place]. [facility]  Patient:    [name], [name]                         MRN: [id number] Proc. Date: 03/31/01 Attending:  Janet Berlin. Dan Humphreys, M.D.                           Operative Report  PREOPERATIVE DIAGNOSIS:  Ischial wound breakdown, status post excision of pressure ulcer and closure with V-Y hamstring advancement flap.  POSTOPERATIVE DIAGNOSIS:  Ischial wound breakdown, status post excision of pressure ulcer and closure with V-Y hamstring advancement flap.  PROCEDURE PERFORMED: 1. Debridement of open ischial wound. 2. Closure of open ischial wound with readvancement of left posterior    thigh V-Y hamstring flap.  SURGEON:  Janet Berlin. Dan Humphreys, M.D.  FIRST ASSISTANT:  None.  ANESTHESIA:  General.  INDICATIONS:  [name] is a 50 year old young man who is 48 days out from excision of a very large left ischial pressure ulcer and closure with a left posterior thigh V-Y musculocutaneous advancement flap.  He was recently noted on wound inspection to have a draining sinus tract that appeared on probing with a sterile swab to extend down to the ischium.  He is taken to the operating room at this time for exploration of his wound.  FINDINGS:  The patient was found to have a focal collection of purulence subcutaneously.  There was a sinus tract that extended medially and then turned deeply to communicate with a recurrent pseudobursa immediately over the ischium.  This cavity was lined with a fairly thick layer of granulation tissue and had a seroma associated with it.  The entire ischial portion of the wound was taken down, debrided, surgically cleansed, and the flap readvanced for closure.  DESCRIPTION OF OPERATION:  The patient was brought down to the operating room. After greeting the patient, he was taken back to the operating room suite where general anesthesia was commenced with him on his [place] bed in a supine position.  He was then placed on  the operating room table in the prone/jackknife position with all bony and soft tissue prominences carefully padded.  The operative field was sterilely prepped and draped using Betadine scrub and paint.  The area of the draining sinus was focally opened and explored.  I found that this tracked medially and then turned deeply.  In order to provide for adequate visualization, ultimately, we ended up needing to open the entire horizontal portion of his ischial wound.  Dissection was continued deeply with findings as noted above.  I was able to get most of the granulation lining out using surgical curettes.  During the wound exploration, a small pocket of pus was located superficially, and this was cultured intraoperatively.  I then directed my attention to removing all portions of tissue that did not appear to be 100% viable.  Fortunately, there were very few areas of frank necrosis.  I was able to freshen up the wound with the combination of using the curette, as well as with sharp debridement using a #10 blade.  The skin portion of the wound where the sinus tract was located was excised.  I then copiously irrigated the open wound.  A total of 6 liters of irrigant was used.  During the course of the pulsatile lavage, I stopped to reconfirm hemostasis and to again remove any stringy pieces of tissue that did not appear  to be viable.  At this point, the wound appeared to be surgically clean.  The tissue edges at this point looked to be fairly well-vascularized, incapable of holding sutures.  I did place a fresh 10 mm Blake drain through a separate stab incision laterally and directed it deeply immediately over the ischium. The wound was then closed in layers.  I used interrupted sutures of 0 Vicryl in both simple interrupted and figure-of-eight fashions to provide for a layered wound closure.  The wound edges were oozing quite a bit, so I elected to place a second drain more superficially.   This was also closed over in layers.  Finally, the wound margins were reapproximated with interrupted, vertical mattress sutures of 2-0 nylon.  Finally, epidermal leveling was achieved with placement of skin staples.  3-0 Prolene sutures were used to anchor the drains externally.  The wound was sterilely dressed.  The patient was then placed on a [employer] fluid air bed in a supine position and then successfully extubated.  He was transported to the postanesthesia care unit and subsequently will be taken back the ward for continued postoperative convalescence. He appeared to tolerate the procedure well.  The estimated blood loss was approximately 200 cc. DD:  03/31/01 TD:  04/02/01 Job: [id number]

## 2011-04-23 NOTE — H&P (Signed)
[place]. [facility]  Patient:    [name]                       MRN: [id number] Adm. Date:  [id number] Attending:  Melody Comas CC:         Rosalyn Gess Norins, M.D. [facility]  Jillyn Hidden M. Dan Humphreys, M.D.  Hewitt Shorts, M.D.   History and Physical  HISTORY OF PRESENT ILLNESS:  This 50 year old white male has an extremely complicated medical history and is admitted at this time for a several-day history of spiking chills and fever to 102-103 degrees.  He has been on Cipro for a decubitus ulcer in his left ischial area.  This is to be skin grafted in the near future by Dr. Desmond Dike.  I saw this patient earlier in the week in the office on March 06, 2001 and he grew gram-negative rods on a urine culture with greater than 100,000 colonies.  It was susceptible to tobramycin and intermediate to ceftriaxone and resistant to everything else.  He came in today for a followup culture to verify the sensitivities and urinalysis today was actually negative on the catheter specimen.  He had fever today here in the office of 102.1 and his blood pressure was 113/63 and pulse was 118.  He had a couple of syncopal episodes while he was here but did not actually lose consciousness.  He has been having some diarrhea and nausea and vomiting and not taking p.o. fluids in very well.  Earlier in the week, he did have a renal ultrasound here in the office that showed no hydronephrosis to the upper tracts but did demonstrate multiple bilateral cysts in the kidneys.  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  He had cord surgery for the hemangioma that left him paraplegic.  He also has pancreatic cysts.  He has been evaluated at [facility] for his multiple problems.  PAST SURGICAL HISTORY:  Cord surgery and multiple eye  procedures.  MEDICATIONS: 1. Baclofen 20 mg q.i.d. 2. Ditropan XL 10 mg a day. 3. Cipro.  SOCIAL HISTORY:  He drinks occasional alcohol.  He smokes a few cigarettes but says he tried to quit two weeks ago.  FAMILY HISTORY:  Negative for other family members with von Hippel-Lindau disease.  REVIEW OF SYSTEMS:  He has some blindness in his left eye.  He has no sore throat, cough, or hearing difficulties.  He does not complain about a stiff neck.  He has no respiratory distress and no cough.  He has had some nausea and vomiting and diarrhea.  He has no abdominal pain that he can perceive.  He does have a known left ischial decubitus ulcer.  He is obviously immobile in the wheelchair confined due to paraplegia.  PHYSICAL EXAMINATION:  VITAL SIGNS:  As noted in the HPI.  GENERAL:  He is otherwise alert and oriented.  SKIN:  Somewhat cold and clammy.  NECK:  Supple.  LUNGS:  No respiratory distress.  HEART:  Heart tones are regular.  ABDOMEN:  Soft and nontender.  GENITOURINARY:  Penis, urethra, meatus, scrotum, testicles, epididymis without lesions.  RECTAL:  Exam was deferred.  EXTREMITIES:  He has a decubitus ulcer over the left ischium and he is obviously paraplegic.  IMPRESSION: 1. Spiking fevers,  probably due to urinary tract infection. 2. 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 3. Multiple renal cysts. 4. Numerous pancreatic cysts. 5. 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 6. Mild cigarette utilization. 7. Left ischial decubitus ulcer. 8. Nausea and vomiting, probably due to fevers and urinary tract infection. 9. Suspected mild dehydration.  PLAN:  Admit for intravenous fluids and IV tobramycin and decubitus care. DD:  03/09/01 TD:  03/09/01 Job: [id number]

## 2011-04-30 ENCOUNTER — Other Ambulatory Visit: Payer: Self-pay | Admitting: Hematology & Oncology

## 2011-04-30 ENCOUNTER — Encounter (HOSPITAL_BASED_OUTPATIENT_CLINIC_OR_DEPARTMENT_OTHER): Payer: Commercial Managed Care - PPO | Admitting: Hematology & Oncology

## 2011-04-30 DIAGNOSIS — C7A098 Malignant carcinoid tumors of other sites: Secondary | ICD-10-CM

## 2011-04-30 DIAGNOSIS — Q859 Phakomatosis, unspecified: Secondary | ICD-10-CM

## 2011-04-30 DIAGNOSIS — C787 Secondary malignant neoplasm of liver and intrahepatic bile duct: Secondary | ICD-10-CM

## 2011-04-30 LAB — CBC WITH DIFFERENTIAL (CANCER CENTER ONLY)
Eosinophils Absolute: 0 10*3/uL (ref 0.0–0.5)
HCT: 42.1 % (ref 38.7–49.9)
LYMPH%: 19.2 % (ref 14.0–48.0)
MCV: 74 fL — ABNORMAL LOW (ref 82–98)
MONO#: 0.6 10*3/uL (ref 0.1–0.9)
NEUT%: 67.9 % (ref 40.0–80.0)
RBC: 5.66 10*6/uL (ref 4.20–5.70)
RDW: 17.1 % — ABNORMAL HIGH (ref 11.1–15.7)
WBC: 5 10*3/uL (ref 4.0–10.0)

## 2011-05-04 LAB — CHROMOGRANIN A: Chromogranin A: 86 ng/mL — ABNORMAL HIGH (ref 1.9–15.0)

## 2011-05-04 LAB — COMPREHENSIVE METABOLIC PANEL
CO2: 27 mEq/L (ref 19–32)
Creatinine, Ser: 0.66 mg/dL (ref 0.40–1.50)
Glucose, Bld: 229 mg/dL — ABNORMAL HIGH (ref 70–99)
Total Bilirubin: 0.4 mg/dL (ref 0.3–1.2)

## 2011-05-04 LAB — PREALBUMIN: Prealbumin: 21.2 mg/dL (ref 17.0–34.0)

## 2011-06-07 ENCOUNTER — Other Ambulatory Visit: Payer: Self-pay | Admitting: Hematology & Oncology

## 2011-06-07 ENCOUNTER — Encounter (HOSPITAL_BASED_OUTPATIENT_CLINIC_OR_DEPARTMENT_OTHER): Payer: Commercial Managed Care - PPO | Admitting: Hematology & Oncology

## 2011-06-07 DIAGNOSIS — Q859 Phakomatosis, unspecified: Secondary | ICD-10-CM

## 2011-06-07 DIAGNOSIS — G8220 Paraplegia, unspecified: Secondary | ICD-10-CM

## 2011-06-07 DIAGNOSIS — C7A098 Malignant carcinoid tumors of other sites: Secondary | ICD-10-CM

## 2011-06-07 DIAGNOSIS — C787 Secondary malignant neoplasm of liver and intrahepatic bile duct: Secondary | ICD-10-CM

## 2011-06-07 LAB — CBC WITH DIFFERENTIAL (CANCER CENTER ONLY)
BASO%: 0.4 % (ref 0.0–2.0)
EOS%: 1.3 % (ref 0.0–7.0)
HCT: 38.4 % — ABNORMAL LOW (ref 38.7–49.9)
LYMPH#: 0.9 10*3/uL (ref 0.9–3.3)
MCHC: 34.6 g/dL (ref 32.0–35.9)
NEUT#: 4 10*3/uL (ref 1.5–6.5)
NEUT%: 74.7 % (ref 40.0–80.0)
RDW: 14.3 % (ref 11.1–15.7)

## 2011-06-07 LAB — URINALYSIS, MICROSCOPIC (CHCC SATELLITE)
Ketones: NEGATIVE mg/dL
Protein: <30 mg/dL

## 2011-06-11 LAB — CHROMOGRANIN A: Chromogranin A: 16 ng/mL — ABNORMAL HIGH (ref 1.9–15.0)

## 2011-06-11 LAB — COMPREHENSIVE METABOLIC PANEL
AST: 29 U/L (ref 0–37)
Alkaline Phosphatase: 166 U/L — ABNORMAL HIGH (ref 39–117)
BUN: 17 mg/dL (ref 6–23)
Glucose, Bld: 288 mg/dL — ABNORMAL HIGH (ref 70–99)
Total Bilirubin: 0.4 mg/dL (ref 0.3–1.2)

## 2011-06-22 ENCOUNTER — Ambulatory Visit
Admission: RE | Admit: 2011-06-22 | Discharge: 2011-06-22 | Disposition: A | Discharge status: Home / Self Care | Payer: Commercial Managed Care - PPO | Source: Ambulatory Visit | Attending: Interventional Radiology | Admitting: Interventional Radiology

## 2011-06-22 VITALS — BP 119/78 | HR 100 | Temp 98.6°F | Resp 14 | Ht 73.0 in | Wt 145.0 lb

## 2011-06-22 DIAGNOSIS — C259 Malignant neoplasm of pancreas, unspecified: Secondary | ICD-10-CM

## 2011-06-24 NOTE — Progress Notes (Signed)
Pt states that his Meds have not changed since last seen here on 11/18/2010.  Appetite: improving. Weight:  Approximately 145 lbs.  Pt states that pain has dec'd.  Infrequently requiring pain meds.   States that abd bloating has dec'd.     Continues to take Valium prn for spasms.

## 2011-07-07 ENCOUNTER — Other Ambulatory Visit: Payer: Self-pay | Admitting: Family

## 2011-07-07 ENCOUNTER — Encounter (HOSPITAL_BASED_OUTPATIENT_CLINIC_OR_DEPARTMENT_OTHER): Payer: Commercial Managed Care - PPO | Admitting: Hematology & Oncology

## 2011-07-07 DIAGNOSIS — G8220 Paraplegia, unspecified: Secondary | ICD-10-CM

## 2011-07-07 DIAGNOSIS — C787 Secondary malignant neoplasm of liver and intrahepatic bile duct: Secondary | ICD-10-CM

## 2011-07-07 LAB — CBC WITH DIFFERENTIAL (CANCER CENTER ONLY)
BASO#: 0 10*3/uL (ref 0.0–0.2)
EOS%: 0 % (ref 0.0–7.0)
Eosinophils Absolute: 0 10*3/uL (ref 0.0–0.5)
HCT: 37.8 % — ABNORMAL LOW (ref 38.7–49.9)
HGB: 13.1 g/dL (ref 13.0–17.1)
LYMPH#: 0.3 10*3/uL — ABNORMAL LOW (ref 0.9–3.3)
MONO#: 0.9 10*3/uL (ref 0.1–0.9)
NEUT#: 8 10*3/uL — ABNORMAL HIGH (ref 1.5–6.5)
RBC: 5.29 10*6/uL (ref 4.20–5.70)
WBC: 9.2 10*3/uL (ref 4.0–10.0)

## 2011-07-07 LAB — URINALYSIS, MICROSCOPIC (CHCC SATELLITE)
Ketones: NEGATIVE mg/dL
Protein: NEGATIVE mg/dL
pH: 7.5 (ref 4.60–8.00)

## 2011-07-15 LAB — URINE CULTURE

## 2011-09-06 LAB — BASIC METABOLIC PANEL
BUN: 14
BUN: 9
BUN: 18
CO2: 25
CO2: 27
CO2: 27
Calcium: 8.8
Calcium: 8.8
Chloride: 106
Chloride: 105
Chloride: 105
Creatinine, Ser: 0.82
Creatinine, Ser: 0.92
Creatinine, Ser: 0.95
Creatinine, Ser: 0.86
GFR calc Af Amer: >60
GFR calc Af Amer: >60
GFR calc non Af Amer: >60
GFR calc non Af Amer: >60
GFR calc non Af Amer: >60
Glucose, Bld: 101 — ABNORMAL HIGH
Glucose, Bld: 105 — ABNORMAL HIGH
Glucose, Bld: 159 — ABNORMAL HIGH
Potassium: 3.4 — ABNORMAL LOW
Sodium: 142
Sodium: 139

## 2011-09-06 LAB — COMPREHENSIVE METABOLIC PANEL
ALT: 30
AST: 49 — ABNORMAL HIGH
Albumin: 2.5 — ABNORMAL LOW
Alkaline Phosphatase: 69
Alkaline Phosphatase: 82
BUN: 19
BUN: 23
GFR calc Af Amer: >60
GFR calc non Af Amer: >60
Glucose, Bld: 101 — ABNORMAL HIGH
Potassium: 4.2
Potassium: 4.4
Sodium: 139
Total Bilirubin: 0.6
Total Protein: 5.9 — ABNORMAL LOW
Total Protein: 6.1

## 2011-09-06 LAB — CROSSMATCH
ABO/RH(D): A POS
Antibody Screen: NEGATIVE

## 2011-09-06 LAB — ABO/RH: ABO/RH(D): A POS

## 2011-09-06 LAB — CBC
HCT: 29.9 — ABNORMAL LOW
HCT: 34.5 — ABNORMAL LOW
HCT: 36 — ABNORMAL LOW
HCT: 36.1 — ABNORMAL LOW
HCT: 28 — ABNORMAL LOW
Hemoglobin: 11.6 — ABNORMAL LOW
Hemoglobin: 11.8 — ABNORMAL LOW
Hemoglobin: 11.8 — ABNORMAL LOW
Hemoglobin: 12 — ABNORMAL LOW
Hemoglobin: 9.7 — ABNORMAL LOW
Hemoglobin: 9.8 — ABNORMAL LOW
Hemoglobin: 9 — ABNORMAL LOW
MCHC: 32.9
MCHC: 33.2
MCHC: 33.3
MCHC: 33.5
MCHC: 33.6
MCHC: 33.8
MCHC: 33.9
MCHC: 32.2
MCHC: 32.8
MCV: 82.1
MCV: 82.8
MCV: 83.3
MCV: 83.7
MCV: 83.8
MCV: 82.8
MCV: 82.9
MCV: 83.5
Platelets: 273
Platelets: 282
Platelets: 294
Platelets: 314
Platelets: 291
Platelets: 393
RBC: 4.2 — ABNORMAL LOW
RBC: 4.23
RBC: 4.24
RBC: 4.26
RBC: 4.31
RBC: 4.41
RDW: 13.7
RDW: 14.4
RDW: 14.9
RDW: 15.4
RDW: 15.5
RDW: 15.6 — ABNORMAL HIGH
RDW: 15.8 — ABNORMAL HIGH
RDW: 14.8
RDW: 15.1
RDW: 15.1
WBC: 6.6
WBC: 7.2
WBC: 7.6
WBC: 7.8
WBC: 8.3
WBC: 19.8 — ABNORMAL HIGH
WBC: 28.2 — ABNORMAL HIGH

## 2011-09-06 LAB — URINALYSIS, ROUTINE W REFLEX MICROSCOPIC
Bilirubin Urine: NEGATIVE
Glucose, UA: NEGATIVE
Glucose, UA: 500 — AB
Ketones, ur: NEGATIVE
Nitrite: NEGATIVE
Specific Gravity, Urine: 1.007
pH: 6
pH: 6

## 2011-09-06 LAB — URINE CULTURE
Colony Count: >=100000
Colony Count: >=100000

## 2011-09-06 LAB — CLOSTRIDIUM DIFFICILE EIA
C difficile Toxins A+B, EIA: 30
C difficile Toxins A+B, EIA: 30

## 2011-09-06 LAB — PROTIME-INR
INR: 1
Prothrombin Time: 13.2

## 2011-09-06 LAB — URINE MICROSCOPIC-ADD ON

## 2011-09-06 LAB — GLUCOSE, CAPILLARY
Glucose-Capillary: 121 — ABNORMAL HIGH
Glucose-Capillary: 136 — ABNORMAL HIGH
Glucose-Capillary: 159 — ABNORMAL HIGH
Glucose-Capillary: 186 — ABNORMAL HIGH
Glucose-Capillary: 206 — ABNORMAL HIGH
Glucose-Capillary: 233 — ABNORMAL HIGH
Glucose-Capillary: 294 — ABNORMAL HIGH
Glucose-Capillary: 321 — ABNORMAL HIGH
Glucose-Capillary: 326 — ABNORMAL HIGH
Glucose-Capillary: 93

## 2011-09-06 LAB — DIFFERENTIAL
Basophils Relative: 0
Eosinophils Relative: 0
Lymphs Abs: 0.8
Monocytes Relative: 7

## 2011-09-06 LAB — LIPASE, BLOOD: Lipase: 10 — ABNORMAL LOW

## 2011-09-07 LAB — GLUCOSE, CAPILLARY
Glucose-Capillary: 145 — ABNORMAL HIGH
Glucose-Capillary: 236 — ABNORMAL HIGH
Glucose-Capillary: 299 — ABNORMAL HIGH

## 2011-09-07 LAB — URINALYSIS, ROUTINE W REFLEX MICROSCOPIC
Bilirubin Urine: NEGATIVE
Glucose, UA: >1000 — AB
Hgb urine dipstick: NEGATIVE
Specific Gravity, Urine: 1.021
Urobilinogen, UA: 0.2

## 2011-09-07 LAB — URINE MICROSCOPIC-ADD ON

## 2011-09-07 LAB — URINE CULTURE: Culture: NO GROWTH

## 2011-09-08 ENCOUNTER — Other Ambulatory Visit: Payer: Self-pay | Admitting: Hematology & Oncology

## 2011-09-08 ENCOUNTER — Encounter (HOSPITAL_BASED_OUTPATIENT_CLINIC_OR_DEPARTMENT_OTHER): Payer: Commercial Managed Care - PPO | Admitting: Hematology & Oncology

## 2011-09-08 DIAGNOSIS — Q859 Phakomatosis, unspecified: Secondary | ICD-10-CM

## 2011-09-08 DIAGNOSIS — C787 Secondary malignant neoplasm of liver and intrahepatic bile duct: Secondary | ICD-10-CM

## 2011-09-08 DIAGNOSIS — G8220 Paraplegia, unspecified: Secondary | ICD-10-CM

## 2011-09-08 DIAGNOSIS — C7A098 Malignant carcinoid tumors of other sites: Secondary | ICD-10-CM

## 2011-09-08 LAB — CBC WITH DIFFERENTIAL (CANCER CENTER ONLY)
BASO#: 0 10*3/uL (ref 0.0–0.2)
BASO%: 0.2 % (ref 0.0–2.0)
EOS%: 0.6 % (ref 0.0–7.0)
HCT: 38.4 % — ABNORMAL LOW (ref 38.7–49.9)
HGB: 12.8 g/dL — ABNORMAL LOW (ref 13.0–17.1)
LYMPH#: 1 10*3/uL (ref 0.9–3.3)
LYMPH%: 15.9 % (ref 14.0–48.0)
MCH: 23.9 pg — ABNORMAL LOW (ref 28.0–33.4)
MCHC: 33.3 g/dL (ref 32.0–35.9)
MCV: 72 fL — ABNORMAL LOW (ref 82–98)
NEUT%: 73.7 % (ref 40.0–80.0)
RDW: 14.7 % (ref 11.1–15.7)

## 2011-09-15 LAB — COMPREHENSIVE METABOLIC PANEL
ALT: 77 U/L — ABNORMAL HIGH (ref 0–53)
AST: 81 U/L — ABNORMAL HIGH (ref 0–37)
BUN: 15 mg/dL (ref 6–23)
Calcium: 8.5 mg/dL (ref 8.4–10.5)
Chloride: 100 mEq/L (ref 96–112)
Creatinine, Ser: 0.63 mg/dL (ref 0.50–1.35)
Total Bilirubin: 0.4 mg/dL (ref 0.3–1.2)

## 2011-09-15 LAB — LACTATE DEHYDROGENASE: LDH: 196 U/L (ref 94–250)

## 2011-10-12 ENCOUNTER — Other Ambulatory Visit: Payer: Self-pay | Admitting: Hematology & Oncology

## 2011-10-12 DIAGNOSIS — C412 Malignant neoplasm of vertebral column: Secondary | ICD-10-CM

## 2011-10-12 MED ORDER — MIRTAZAPINE 15 MG PO TABS: 15.0000 mg | ORAL_TABLET | Freq: Every day | ORAL | Status: DC

## 2011-11-02 ENCOUNTER — Telehealth: Payer: Self-pay | Admitting: *Deleted

## 2011-11-02 NOTE — Telephone Encounter (Signed)
I  reviewed Tomo dosimetry with [facility] and agree.

## 2011-11-02 NOTE — Telephone Encounter (Signed)
Pt called stating he is scheduled for L lower jaw root canal 11/04/11. Pt states dentist inquiring about performing root canal given pt's history of Tomotherapy radiation tx in 2010. Informed pt will discuss w/Dr Dayton Scrape and he will receive call back today from this RN or dr. Pt verbalized understanding. Information given to Dr Dayton Scrape. 3:23 pm Per Dr Dayton Scrape, called pt and informed him his mandible was not in treatment field, did not receive any radiation. Pt is cleared to have any dental work he needs. Gave pt dr's name in event oral surgeon/dentist has further questions. Pt verbalized understanding.

## 2011-11-24 ENCOUNTER — Other Ambulatory Visit (HOSPITAL_COMMUNITY): Payer: Self-pay | Admitting: Interventional Radiology

## 2011-11-24 ENCOUNTER — Other Ambulatory Visit: Payer: Self-pay | Admitting: Interventional Radiology

## 2011-11-24 DIAGNOSIS — C787 Secondary malignant neoplasm of liver and intrahepatic bile duct: Secondary | ICD-10-CM

## 2011-11-24 DIAGNOSIS — C259 Malignant neoplasm of pancreas, unspecified: Secondary | ICD-10-CM

## 2011-12-01 ENCOUNTER — Other Ambulatory Visit: Payer: Self-pay | Admitting: Hematology & Oncology

## 2011-12-01 DIAGNOSIS — C7A8 Other malignant neuroendocrine tumors: Secondary | ICD-10-CM

## 2011-12-20 ENCOUNTER — Other Ambulatory Visit: Payer: Self-pay | Admitting: *Deleted

## 2011-12-20 MED ORDER — HYOSCYAMINE SULFATE 0.125 MG SL SUBL: 0.1250 mg | SUBLINGUAL_TABLET | SUBLINGUAL | Status: DC | PRN

## 2011-12-20 NOTE — Telephone Encounter (Signed)
Received a fax from [employer] stating that Donnatal elixir is no longer available as a generic and is $100 co-pay. Wanted to know if they could "switch to just plain hyosycamine or what do you want to switch to?". Ok to switch to SL preparation per Dr Myna Hidalgo. Will send via epresribe.

## 2011-12-22 ENCOUNTER — Other Ambulatory Visit: Payer: Commercial Managed Care - PPO | Admitting: Lab

## 2011-12-22 ENCOUNTER — Other Ambulatory Visit: Payer: Self-pay | Admitting: Emergency Medicine

## 2011-12-22 ENCOUNTER — Ambulatory Visit: Payer: Commercial Managed Care - PPO | Admitting: Hematology & Oncology

## 2011-12-22 ENCOUNTER — Telehealth: Payer: Self-pay | Admitting: Hematology & Oncology

## 2011-12-22 DIAGNOSIS — C787 Secondary malignant neoplasm of liver and intrahepatic bile duct: Secondary | ICD-10-CM

## 2011-12-22 NOTE — Telephone Encounter (Signed)
Pt's wife called and cx 12/22/11 apt due to husband not feeling well.  She resch for 01/18/12.  Nurse was notified of cx apt

## 2011-12-25 ENCOUNTER — Other Ambulatory Visit: Payer: Self-pay | Admitting: Interventional Radiology

## 2011-12-25 LAB — BUN: BUN: 17 mg/dL (ref 6–23)

## 2011-12-26 LAB — CREATININE WITH EST GFR
Creat: 0.65 mg/dL (ref 0.50–1.35)
GFR, Est African American: >89 mL/min
GFR, Est Non African American: >89 mL/min

## 2011-12-28 ENCOUNTER — Ambulatory Visit
Admission: RE | Admit: 2011-12-28 | Discharge: 2011-12-28 | Disposition: A | Discharge status: Home / Self Care | Payer: Commercial Managed Care - PPO | Source: Ambulatory Visit | Attending: Hematology & Oncology | Admitting: Hematology & Oncology

## 2011-12-28 ENCOUNTER — Ambulatory Visit
Admission: RE | Admit: 2011-12-28 | Discharge: 2011-12-28 | Disposition: A | Discharge status: Home / Self Care | Payer: Commercial Managed Care - PPO | Source: Ambulatory Visit | Attending: Interventional Radiology | Admitting: Interventional Radiology

## 2011-12-28 DIAGNOSIS — C259 Malignant neoplasm of pancreas, unspecified: Secondary | ICD-10-CM

## 2011-12-28 DIAGNOSIS — C787 Secondary malignant neoplasm of liver and intrahepatic bile duct: Secondary | ICD-10-CM

## 2011-12-28 DIAGNOSIS — Z5189 Encounter for other specified aftercare: Secondary | ICD-10-CM | POA: Diagnosis not present

## 2011-12-28 DIAGNOSIS — C649 Malignant neoplasm of unspecified kidney, except renal pelvis: Secondary | ICD-10-CM | POA: Diagnosis not present

## 2011-12-28 DIAGNOSIS — C7A8 Other malignant neuroendocrine tumors: Secondary | ICD-10-CM

## 2011-12-28 DIAGNOSIS — R109 Unspecified abdominal pain: Secondary | ICD-10-CM | POA: Diagnosis not present

## 2011-12-28 MED ORDER — IOHEXOL 350 MG/ML SOLN: 100.0000 mL | Freq: Once | INTRAVENOUS | Status: AC | PRN

## 2012-01-10 ENCOUNTER — Other Ambulatory Visit: Payer: Self-pay | Admitting: *Deleted

## 2012-01-10 ENCOUNTER — Telehealth: Payer: Self-pay | Admitting: *Deleted

## 2012-01-10 DIAGNOSIS — M62838 Other muscle spasm: Secondary | ICD-10-CM

## 2012-01-10 DIAGNOSIS — C7A8 Other malignant neuroendocrine tumors: Secondary | ICD-10-CM | POA: Insufficient documentation

## 2012-01-10 MED ORDER — TIZANIDINE HCL 4 MG PO CAPS: ORAL_CAPSULE | ORAL | Status: DC

## 2012-01-10 NOTE — Telephone Encounter (Signed)
Pt called needing to a rx for Zanaflex

## 2012-01-10 NOTE — Telephone Encounter (Signed)
Pt aware of 2-5 appointment °

## 2012-01-10 NOTE — Telephone Encounter (Signed)
Pt called with c/o yellow "baby poop" diarrhea. He and his wife have correlated it with the Afinitor by drug holidays and stopped taking it last evening. His diarrhea has improved since then. Also wants to know if Dr Myna Hidalgo has seen his most recent CT Scan. It shows progressive disease in the liver. He had this done through his surgeons office. Reviewed with Dr Myna Hidalgo. To come in tomorrow for an appt. Will have Rick schedule the appt & call the pt.

## 2012-01-11 ENCOUNTER — Ambulatory Visit (HOSPITAL_BASED_OUTPATIENT_CLINIC_OR_DEPARTMENT_OTHER): Payer: Commercial Managed Care - PPO

## 2012-01-11 ENCOUNTER — Ambulatory Visit (HOSPITAL_BASED_OUTPATIENT_CLINIC_OR_DEPARTMENT_OTHER): Payer: Commercial Managed Care - PPO | Admitting: Hematology & Oncology

## 2012-01-11 ENCOUNTER — Other Ambulatory Visit (HOSPITAL_BASED_OUTPATIENT_CLINIC_OR_DEPARTMENT_OTHER): Payer: Commercial Managed Care - PPO | Admitting: Lab

## 2012-01-11 VITALS — BP 102/71 | HR 100 | Temp 96.9°F

## 2012-01-11 DIAGNOSIS — C7A8 Other malignant neuroendocrine tumors: Secondary | ICD-10-CM

## 2012-01-11 DIAGNOSIS — C787 Secondary malignant neoplasm of liver and intrahepatic bile duct: Secondary | ICD-10-CM

## 2012-01-11 DIAGNOSIS — R197 Diarrhea, unspecified: Secondary | ICD-10-CM

## 2012-01-11 DIAGNOSIS — D481 Neoplasm of uncertain behavior of connective and other soft tissue: Secondary | ICD-10-CM | POA: Diagnosis not present

## 2012-01-11 DIAGNOSIS — C7A098 Malignant carcinoid tumors of other sites: Secondary | ICD-10-CM | POA: Diagnosis not present

## 2012-01-11 DIAGNOSIS — D509 Iron deficiency anemia, unspecified: Secondary | ICD-10-CM | POA: Diagnosis not present

## 2012-01-11 DIAGNOSIS — Q859 Phakomatosis, unspecified: Secondary | ICD-10-CM

## 2012-01-11 DIAGNOSIS — C801 Malignant (primary) neoplasm, unspecified: Secondary | ICD-10-CM | POA: Diagnosis not present

## 2012-01-11 LAB — CBC WITH DIFFERENTIAL (CANCER CENTER ONLY)
BASO%: 0.4 % (ref 0.0–2.0)
EOS%: 0.8 % (ref 0.0–7.0)
HGB: 14.2 g/dL (ref 13.0–17.1)
LYMPH#: 0.9 10*3/uL (ref 0.9–3.3)
MCHC: 33.2 g/dL (ref 32.0–35.9)
NEUT#: 3.5 10*3/uL (ref 1.5–6.5)
RDW: 14.4 % (ref 11.1–15.7)

## 2012-01-11 MED ORDER — CAPECITABINE 500 MG PO TABS: ORAL_TABLET | ORAL | Status: DC

## 2012-01-11 MED ORDER — TEMOZOLOMIDE 140 MG PO CAPS: ORAL_CAPSULE | ORAL | Status: DC

## 2012-01-11 MED ORDER — OCTREOTIDE ACETATE 30 MG IM KIT
30.0000 mg | PACK | Freq: Once | INTRAMUSCULAR | Status: AC
  Administered 2012-01-11: 30 mg via INTRAMUSCULAR

## 2012-01-11 MED ORDER — SULFAMETHOXAZOLE-TRIMETHOPRIM 800-160 MG PO TABS: 1.0000 | ORAL_TABLET | Freq: Every day | ORAL | Status: DC

## 2012-01-11 NOTE — Progress Notes (Signed)
This office note has been dictated.

## 2012-01-13 NOTE — Progress Notes (Signed)
CC:   [name], M.D. Dr. Isaiah Serge, [phone]  DIAGNOSES: 1. 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  CURRENT THERAPY:  Afinitor 10 mg p.o. daily.  INTERIM HISTORY:  [name] comes in for follow-up.  He is doing okay although his cancer unfortunately is progressing.  We did have some scans done on him recently.  He had these done on January 22nd.  This unfortunately showed progressive liver metastases.  He had "numerous" liver metastases with interval increase in both size and number.  This is mainly in the left hepatic and caudate lobes.  He had increased size of the two renal cell carcinomas in the interpolar region of the left kidney.  Two renal cell carcinomas in the upper pole of the left kidney were stable in size.  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  He had been on Afinitor.  This had been helping him.  We have been following his chromogranin A level.  This back in October was 28.  He is having more in the way of diarrhea.  He is not on Sandostatin.  It has been, I think, a few years since he was on Sandostatin.  There is a question of him having hypoglycemia from Sandostatin.  This I think was debatable, but I think it would be worthwhile trying to give him a trial again of Sandostatin.  He has diabetes.  He is on insulin.  His wife is incredibly dedicated to helping take care of him.  His wife works in physical therapy over at [facility].  He has not noted any bleeding.  He has had no nausea and vomiting.  He has had no headache.  There has been no double vision or blurred vision. He has not noted any kind of rashes.  PHYSICAL EXAMINATION:  General Appearance:  This is a fairly well- developed, well-nourished white gentleman in no obvious distress.  Vital Signs:  96.9, pulse 100, respiratory rate 18, blood  pressure 102/71. Weight was about 145.  Head and Neck Exam:  Shows a normocephalic, atraumatic skull.  There are no ocular or oral lesions.  He has no palpable cervical or supraclavicular lymph nodes.  Lungs:  Clear bilaterally.  He has no rales, wheezes or rhonchi.  Cardiac Exam: Regular rate and rhythm with a normal S1 and S2.  There are no murmurs, rubs or bruits.  Abdominal Exam:  Soft with good bowel sounds.  There is no palpable abdominal mass.  There is no fluid wave.  There is no palpable hepatosplenomegaly.  Back Exam:  No tenderness over the spine, ribs or hips.  Extremities:  Show no movement below the hips.  He has no swelling in his legs.  Skin Exam:  Shows no rashes.  LABORATORY STUDIES:  White cell count is 5, hemoglobin 14.2, hematocrit 42.8, platelet count 257.  IMPRESSION:  [name] is a 51 year old gentleman with von Hippel- Lindau syndrome.  He has metastatic neuroendocrine carcinomas.  He has been dealing with this for many years.  Of note, he does go [facility] to [facility].  He is next due to go up there in April.  I think we are going to have to be more aggressive now with his neuroendocrine cancers.  I do not think we have to do any kind of biopsy on these.  I feel clinically everything is all consistent with progression of his disease.  I think that a trial of capecitabine and temozolomide would be realistic.  I think he could tolerate this.  It is oral which is nice. Response rates do seem to fairly decent.  Toxicity would not be too much of a problem from the trial that was run with this program.  I think if we start [name] on capecitabine/temozolomide now, when he goes up to [facility], he will be able to have his scans repeated and hopefully be able to see a response.  I will be interested to see what his chromogranin A level is.  We will go ahead also and start him on Sandostatin.  We will give him a 30 mg dose today.  We will see how he tolerates  this.  I spent a good 45 minutes or so with [name] and his wife.  They are both very nice, and it is always fun talking with them.  I did go over the side effects of the chemotherapy with [name].  I did put him on Bactrim to help with Pneumocystis which is seen in patients on temozolomide.    ______________________________ [name], M.D. PRE/MEDQ  D:  01/12/2012  T:  01/13/2012  Job:  [id number]

## 2012-01-15 LAB — COMPREHENSIVE METABOLIC PANEL
ALT: 82 U/L — ABNORMAL HIGH (ref 0–53)
AST: 114 U/L — ABNORMAL HIGH (ref 0–37)
Albumin: 3.7 g/dL (ref 3.5–5.2)
Calcium: 8.8 mg/dL (ref 8.4–10.5)
Chloride: 97 mEq/L (ref 96–112)
Potassium: 4.5 mEq/L (ref 3.5–5.3)

## 2012-01-15 LAB — PREALBUMIN: Prealbumin: 18 mg/dL (ref 17.0–34.0)

## 2012-01-17 ENCOUNTER — Telehealth: Payer: Self-pay | Admitting: *Deleted

## 2012-01-17 NOTE — Telephone Encounter (Signed)
Called patient to let him know that his LFTs were a little high but ok and CBGs were very high.  Patient aware of this and states it is due to "liver involvement".

## 2012-01-17 NOTE — Telephone Encounter (Signed)
Message copied by Anselm Jungling on Mon Jan 17, 2012  2:42 PM ------      Message from: Arlan Organ R      Created: Thu Jan 13, 2012  6:33 PM       Call and tell him that the LFTs are a little high but still ok!! CBG is quite high!!!  [name]

## 2012-01-18 ENCOUNTER — Ambulatory Visit: Payer: Commercial Managed Care - PPO | Admitting: Hematology & Oncology

## 2012-01-18 ENCOUNTER — Other Ambulatory Visit: Payer: Commercial Managed Care - PPO | Admitting: Lab

## 2012-01-20 ENCOUNTER — Other Ambulatory Visit: Payer: Self-pay | Admitting: *Deleted

## 2012-01-20 DIAGNOSIS — C7A8 Other malignant neuroendocrine tumors: Secondary | ICD-10-CM

## 2012-01-20 MED ORDER — PROMETHAZINE HCL 12.5 MG PO TABS: 12.5000 mg | ORAL_TABLET | Freq: Four times a day (QID) | ORAL | Status: DC | PRN

## 2012-01-20 NOTE — Telephone Encounter (Signed)
Pt called requesting a rx for nausea medication as he will be starting a new chemo pill on Saturday. Reviewed with Dr Myna Hidalgo. To provide Phenergan 12.5 mg po q6h prn. This was eprescribed to [facility] outpatient pharmacy.

## 2012-01-25 ENCOUNTER — Encounter: Payer: Self-pay | Admitting: Hematology & Oncology

## 2012-02-08 ENCOUNTER — Ambulatory Visit (HOSPITAL_BASED_OUTPATIENT_CLINIC_OR_DEPARTMENT_OTHER): Payer: Medicare Other

## 2012-02-08 ENCOUNTER — Ambulatory Visit (HOSPITAL_BASED_OUTPATIENT_CLINIC_OR_DEPARTMENT_OTHER): Payer: Commercial Managed Care - PPO | Admitting: Hematology & Oncology

## 2012-02-08 ENCOUNTER — Other Ambulatory Visit (HOSPITAL_BASED_OUTPATIENT_CLINIC_OR_DEPARTMENT_OTHER): Payer: Commercial Managed Care - PPO | Admitting: Lab

## 2012-02-08 VITALS — BP 99/70 | HR 89 | Temp 96.8°F

## 2012-02-08 DIAGNOSIS — C787 Secondary malignant neoplasm of liver and intrahepatic bile duct: Secondary | ICD-10-CM

## 2012-02-08 DIAGNOSIS — Q859 Phakomatosis, unspecified: Secondary | ICD-10-CM

## 2012-02-08 DIAGNOSIS — C7A098 Malignant carcinoid tumors of other sites: Secondary | ICD-10-CM | POA: Diagnosis not present

## 2012-02-08 DIAGNOSIS — C7A8 Other malignant neuroendocrine tumors: Secondary | ICD-10-CM

## 2012-02-08 DIAGNOSIS — C801 Malignant (primary) neoplasm, unspecified: Secondary | ICD-10-CM | POA: Diagnosis not present

## 2012-02-08 LAB — COMPREHENSIVE METABOLIC PANEL
AST: 51 U/L — ABNORMAL HIGH (ref 0–37)
Alkaline Phosphatase: 187 U/L — ABNORMAL HIGH (ref 39–117)
Glucose, Bld: 144 mg/dL — ABNORMAL HIGH (ref 70–99)
Potassium: 5.5 mEq/L — ABNORMAL HIGH (ref 3.5–5.3)
Sodium: 133 mEq/L — ABNORMAL LOW (ref 135–145)
Total Bilirubin: 0.6 mg/dL (ref 0.3–1.2)
Total Protein: 7.3 g/dL (ref 6.0–8.3)

## 2012-02-08 LAB — CBC WITH DIFFERENTIAL (CANCER CENTER ONLY)
EOS%: 1.7 % (ref 0.0–7.0)
Eosinophils Absolute: 0.1 10*3/uL (ref 0.0–0.5)
LYMPH%: 25.2 % (ref 14.0–48.0)
MCH: 25.9 pg — ABNORMAL LOW (ref 28.0–33.4)
MCHC: 32.3 g/dL (ref 32.0–35.9)
MCV: 80 fL — ABNORMAL LOW (ref 82–98)
MONO%: 11.3 % (ref 0.0–13.0)
NEUT#: 2.8 10*3/uL (ref 1.5–6.5)
Platelets: 212 10*3/uL (ref 145–400)
RBC: 5.71 10*6/uL — ABNORMAL HIGH (ref 4.20–5.70)
RDW: 20 % — ABNORMAL HIGH (ref 11.1–15.7)

## 2012-02-08 LAB — LACTATE DEHYDROGENASE: LDH: 144 U/L (ref 94–250)

## 2012-02-08 MED ORDER — OCTREOTIDE ACETATE 30 MG IM KIT
30.0000 mg | PACK | Freq: Once | INTRAMUSCULAR | Status: AC
  Administered 2012-02-08: 30 mg via INTRAMUSCULAR

## 2012-02-08 NOTE — Progress Notes (Signed)
CC:   Avonte C. [name], M.D. Electron Vida Rigger, MD, Fax [phone]  DIAGNOSES: 1. Progressive neuroendocrine carcinoma. 2. Von Hippel-Lindau syndrome.  CURRENT THERAPY: 1. Xeloda 1000 mg p.o. b.i.d. x14 days on and 14 days off. 2. Temodar 280 mg p.o. daily x4 days every 4 weeks. 3. Sandostatin 30 mg IM q.month.  INTERIM HISTORY:  Mr. Perin comes in for followup.  He has done very well with his 1st cycle of chemo.  He has had some abdominal discomfort. He does have a history of abdominal spasms.  His appetite has been okay. He has had no problems with diarrhea.  He has had no cough.  He is on Bactrim as a prophylactic because of the Temodar.  His blood sugars have been doing fairly well.  He manages these aggressively.  He has had no bleeding.  He has had no mouth sores.  There has been no pain in his hands.  He is paraplegic.  PHYSICAL EXAM:  This is a well-developed well-nourished white gentleman in no obvious distress.  Vital Signs:  Temperature of 96.8, pulse 89, respiratory rate 18, blood pressure 99/70.  Head and neck:  No oral mucositis.  There are no oral lesions.  He has no scleral icterus.  He has no adenopathy in his neck.  Lungs:  Clear bilaterally.  He has no rales, wheezes or rhonchi.  Cardiac:  Regular rate and rhythm with a normal S1, S2.  There are no murmurs, rubs or bruits.  Abdomen:  Soft with good bowel sounds.  There is no fluid wave.  He has well-healed laparotomy scars.  There is no palpable hepatosplenomegaly.  Back:  No tenderness over the spine, ribs, or hips.  Extremities:  No edema in his legs.  Neurological:  Paraplegia from the waist down.  LABORATORY STUDIES:  White cell count is 4.6, hemoglobin 14.8, hematocrit 45.8, platelet count 212.  IMPRESSION:  Mr. Salminen is a 51 year old gentleman with von Hippel- Lindau syndrome.  He does have neuroendocrine carcinomas associated with von Hippel-Lindau syndrome.  Hopefully, we will see a response  with the Xeloda/Temodar regimen. Again, he is tolerating it nicely.  I probably would plan for 3 cycles of treatment and then repeat his scans.  He will start his 2nd cycle of chemotherapy I think this week.  Will plan to get him back in 4 more weeks.    ______________________________ [name], M.D. PRE/MEDQ  D:  02/08/2012  T:  02/08/2012  Job:  [id number]

## 2012-02-08 NOTE — Progress Notes (Signed)
This office note has been dictated.

## 2012-02-28 ENCOUNTER — Other Ambulatory Visit: Payer: Self-pay | Admitting: *Deleted

## 2012-02-28 DIAGNOSIS — C7A8 Other malignant neuroendocrine tumors: Secondary | ICD-10-CM

## 2012-02-28 MED ORDER — MIRTAZAPINE 15 MG PO TABS: 15.0000 mg | ORAL_TABLET | Freq: Every day | ORAL | Status: DC

## 2012-02-28 NOTE — Telephone Encounter (Signed)
Received refill request for mirtazpine 15 mg from [facility]. This is a chronic medication for the pt. Refilled via eprescribe.

## 2012-03-07 ENCOUNTER — Ambulatory Visit (HOSPITAL_BASED_OUTPATIENT_CLINIC_OR_DEPARTMENT_OTHER): Payer: Commercial Managed Care - PPO | Admitting: Hematology & Oncology

## 2012-03-07 ENCOUNTER — Other Ambulatory Visit (HOSPITAL_BASED_OUTPATIENT_CLINIC_OR_DEPARTMENT_OTHER): Payer: Commercial Managed Care - PPO | Admitting: Lab

## 2012-03-07 ENCOUNTER — Ambulatory Visit (HOSPITAL_BASED_OUTPATIENT_CLINIC_OR_DEPARTMENT_OTHER): Payer: Medicare Other

## 2012-03-07 VITALS — BP 88/60 | HR 81 | Temp 96.7°F | Ht 73.0 in

## 2012-03-07 DIAGNOSIS — Q859 Phakomatosis, unspecified: Secondary | ICD-10-CM

## 2012-03-07 DIAGNOSIS — C7A00 Malignant carcinoid tumor of unspecified site: Secondary | ICD-10-CM | POA: Diagnosis not present

## 2012-03-07 DIAGNOSIS — C7A8 Other malignant neuroendocrine tumors: Secondary | ICD-10-CM

## 2012-03-07 DIAGNOSIS — Z79899 Other long term (current) drug therapy: Secondary | ICD-10-CM | POA: Diagnosis not present

## 2012-03-07 DIAGNOSIS — C801 Malignant (primary) neoplasm, unspecified: Secondary | ICD-10-CM | POA: Diagnosis not present

## 2012-03-07 LAB — CBC WITH DIFFERENTIAL (CANCER CENTER ONLY)
BASO%: 0.3 % (ref 0.0–2.0)
LYMPH#: 1.2 10*3/uL (ref 0.9–3.3)
MONO#: 0.5 10*3/uL (ref 0.1–0.9)
Platelets: 210 10*3/uL (ref 145–400)
RDW: 21.4 % — ABNORMAL HIGH (ref 11.1–15.7)
WBC: 7.8 10*3/uL (ref 4.0–10.0)

## 2012-03-07 MED ORDER — OCTREOTIDE ACETATE 30 MG IM KIT
30.0000 mg | PACK | Freq: Once | INTRAMUSCULAR | Status: AC
  Administered 2012-03-07: 30 mg via INTRAMUSCULAR

## 2012-03-08 ENCOUNTER — Telehealth: Payer: Self-pay | Admitting: Hematology & Oncology

## 2012-03-08 NOTE — Telephone Encounter (Signed)
Pt aware of 4-24 CT at the [facility]. He is aware to drink contrast at 12 and 1 pm and to be NPO 4 hrs

## 2012-03-09 NOTE — Progress Notes (Signed)
This office note has been dictated.

## 2012-03-09 NOTE — Progress Notes (Signed)
CC:   Lakai C. [name], M.D. Electron Vida Rigger, MD, Fax [phone]  DIAGNOSES: 1. 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  CURRENT THERAPY:  The patient is status post 2 cycles of Temodar and Xeloda.  INTERIM HISTORY:  Mr. Hengst comes in for his followup.  He has done quite well.  He does have occasional bloating.  This has been more of a chronic problem for him.  He has had a little nausea, but no vomiting with the Temodar/Xeloda.  He is paraplegic.  He, I think, self- catheterizes himself.  Thankfully, he has not had any issues with urinary infections.  He is on Sandostatin.  He gets this monthly.  He has had no fever.  He has had no bleeding.  He has had no headache.  He goes [facility] to [facility] in [place], [place], I think in mid May.  By then, I think he would have had 3 full cycles of chemotherapy.  PHYSICAL EXAMINATION:  General:  This is a well-developed, well- nourished white gentleman in no obvious distress.  Vital Signs: Temperature 96.7, pulse 81, respiratory rate 18, blood pressure 88/60. Weight was not taken.  Head and Neck Exam:  Shows a normocephalic, atraumatic skull.  There are no ocular or oral lesions.  There are no palpable cervical or supraclavicular lymph nodes.  Lungs:  Clear bilaterally.  Cardiac Exam:  Regular rate and rhythm with a normal S1 and S2.  There are no murmurs, rubs, or bruits.  Abdominal Exam:  Soft with good bowel sounds.  There is no fluid wave.  He may have a little bit of distention.  There is no guarding or rebound tenderness.  There is no obvious abdominal mass.  There is no palpable hepatosplenomegaly. Extremities:  Show paraplegia.  He has no movement or sensation in his legs.  There is no swelling in his legs.  Skin Exam:  No rashes. Neurological Exam:  Shows the paraplegia.  LABORATORY STUDIES:  White cell count 7.8, hemoglobin 13.9, hematocrit 42.7, platelet count 210.  His sodium is 137, potassium 5.1, BUN  26, creatinine 0.88.  Glucose is elevated at 200.  Alkaline phosphatase is 176.  LFTs are normal otherwise.  LDH is 141.  His chromogranin A level is still pending.  IMPRESSION:  Mr. Decesare is a 51 year old gentleman with metastatic neuroendocrine carcinomas.  He has underlying von Hippel-Lindau syndrome.  He is on chemotherapy now with Xeloda/Temodar.  He has actually done quite well with this.  I forgot to mention that he is also taking Bactrim to help with Pneumocystis prophylaxis.  We will set him up with a followup CT scan.  I want to get this set up I think for late April.  Hopefully, we will see that he is responding.  I will see him back myself in a month.    ______________________________ [name], M.D. PRE/MEDQ  D:  03/09/2012  T:  03/09/2012  Job:  [id number]

## 2012-03-13 LAB — COMPREHENSIVE METABOLIC PANEL
ALT: 25 U/L (ref 0–53)
AST: 32 U/L (ref 0–37)
Albumin: 3.9 g/dL (ref 3.5–5.2)
Calcium: 9.2 mg/dL (ref 8.4–10.5)
Chloride: 97 mEq/L (ref 96–112)
Creatinine, Ser: 0.88 mg/dL (ref 0.50–1.35)
Potassium: 5.1 mEq/L (ref 3.5–5.3)

## 2012-03-13 LAB — CHROMOGRANIN A: Chromogranin A: 26 ng/mL — ABNORMAL HIGH (ref 1.9–15.0)

## 2012-03-29 ENCOUNTER — Ambulatory Visit
Admission: RE | Admit: 2012-03-29 | Discharge: 2012-03-29 | Disposition: A | Discharge status: Home / Self Care | Payer: 59 | Source: Ambulatory Visit | Attending: Hematology & Oncology | Admitting: Hematology & Oncology

## 2012-03-29 ENCOUNTER — Other Ambulatory Visit: Payer: Medicare Other

## 2012-03-29 DIAGNOSIS — C787 Secondary malignant neoplasm of liver and intrahepatic bile duct: Secondary | ICD-10-CM | POA: Diagnosis not present

## 2012-03-29 DIAGNOSIS — C7A8 Other malignant neuroendocrine tumors: Secondary | ICD-10-CM

## 2012-03-29 DIAGNOSIS — D490 Neoplasm of unspecified behavior of digestive system: Secondary | ICD-10-CM | POA: Diagnosis not present

## 2012-03-29 MED ORDER — IOHEXOL 300 MG/ML  SOLN
30.0000 mL | Freq: Once | INTRAMUSCULAR | Status: AC | PRN
  Administered 2012-03-29: 30 mL via ORAL

## 2012-03-29 MED ORDER — IOHEXOL 300 MG/ML  SOLN
100.0000 mL | Freq: Once | INTRAMUSCULAR | Status: AC | PRN
  Administered 2012-03-29: 100 mL via INTRAVENOUS

## 2012-04-04 ENCOUNTER — Other Ambulatory Visit: Payer: Self-pay | Admitting: *Deleted

## 2012-04-04 ENCOUNTER — Other Ambulatory Visit (HOSPITAL_BASED_OUTPATIENT_CLINIC_OR_DEPARTMENT_OTHER): Payer: Medicare Other | Admitting: Lab

## 2012-04-04 ENCOUNTER — Ambulatory Visit (HOSPITAL_BASED_OUTPATIENT_CLINIC_OR_DEPARTMENT_OTHER): Payer: Medicare Other | Admitting: Hematology & Oncology

## 2012-04-04 ENCOUNTER — Ambulatory Visit (HOSPITAL_BASED_OUTPATIENT_CLINIC_OR_DEPARTMENT_OTHER): Payer: Medicare Other

## 2012-04-04 VITALS — BP 80/52 | HR 95 | Temp 96.6°F

## 2012-04-04 DIAGNOSIS — R141 Gas pain: Secondary | ICD-10-CM

## 2012-04-04 DIAGNOSIS — C787 Secondary malignant neoplasm of liver and intrahepatic bile duct: Secondary | ICD-10-CM

## 2012-04-04 DIAGNOSIS — K589 Irritable bowel syndrome without diarrhea: Secondary | ICD-10-CM

## 2012-04-04 DIAGNOSIS — C7A00 Malignant carcinoid tumor of unspecified site: Secondary | ICD-10-CM

## 2012-04-04 DIAGNOSIS — R142 Eructation: Secondary | ICD-10-CM | POA: Diagnosis not present

## 2012-04-04 DIAGNOSIS — C7A8 Other malignant neuroendocrine tumors: Secondary | ICD-10-CM

## 2012-04-04 DIAGNOSIS — Q859 Phakomatosis, unspecified: Secondary | ICD-10-CM | POA: Diagnosis not present

## 2012-04-04 DIAGNOSIS — R143 Flatulence: Secondary | ICD-10-CM | POA: Diagnosis not present

## 2012-04-04 LAB — CBC WITH DIFFERENTIAL (CANCER CENTER ONLY)
BASO#: 0 10*3/uL (ref 0.0–0.2)
BASO%: 0.4 % (ref 0.0–2.0)
EOS%: 1.3 % (ref 0.0–7.0)
MCH: 29 pg (ref 28.0–33.4)
MCHC: 33 g/dL (ref 32.0–35.9)
MONO%: 11.4 % (ref 0.0–13.0)
NEUT#: 3.8 10*3/uL (ref 1.5–6.5)
Platelets: 217 10*3/uL (ref 145–400)
RDW: 18.8 % — ABNORMAL HIGH (ref 11.1–15.7)

## 2012-04-04 MED ORDER — LUBIPROSTONE 8 MCG PO CAPS
8.0000 ug | ORAL_CAPSULE | Freq: Two times a day (BID) | ORAL | Status: AC
Start: 2012-04-04 — End: 2012-05-04

## 2012-04-04 MED ORDER — OCTREOTIDE ACETATE 30 MG IM KIT
30.0000 mg | PACK | Freq: Once | INTRAMUSCULAR | Status: AC
  Administered 2012-04-04: 30 mg via INTRAMUSCULAR

## 2012-04-04 NOTE — Telephone Encounter (Signed)
Received refill request from [facility] for Valium 5 mg. This is a long term med and was authorized by Dr Myna Hidalgo during his visit today. Called to [facility] pharmacy at [phone].

## 2012-04-04 NOTE — Patient Instructions (Signed)
Octreotide injection solution  What is this medicine?  OCTREOTIDE (ok TREE oh tide) is used to reduce blood levels of growth hormone in patients with a condition called acromegaly. This medicine also reduces flushing and watery diarrhea caused by certain types of cancer.  This medicine may be used for other purposes; ask your health care provider or pharmacist if you have questions.  What should I tell my health care provider before I take this medicine?  They need to know if you have any of these conditions: -gallbladder disease -kidney disease -liver disease -an unusual or allergic reaction to octreotide, other medicines, foods, dyes, or preservatives -pregnant or trying to get pregnant -breast-feeding  How should I use this medicine?  This medicine is for injection under the skin or into a vein (only in emergency situations). It is usually given by a health care professional in a clinical setting.     What if I miss a dose?  If you miss a dose, take it as soon as you can. If it is almost time for your next dose, take only that dose. Do not take double or extra doses.  What may interact with this medicine?  Do not take this medicine with any of the following medications: -cisapride -droperidol -general anesthetics -grepafloxacin -perphenazine -thioridazine This medicine may also interact with the following medications: -bromocriptine -cyclosporine -diuretics -medicines for blood pressure, heart disease, irregular heart beat -medicines for diabetes, including insulin -quinidine  This list may not describe all possible interactions. Give your health care provider a list of all the medicines, herbs, non-prescription drugs, or dietary supplements you use. Also tell them if you smoke, drink alcohol, or use illegal drugs. Some items may interact with your medicine.  What should I watch for while using this medicine?  Visit your doctor or health care professional for  regular checks on your progress.  To help reduce irritation at the injection site, use a different site for each injection and make sure the solution is at room temperature before use. This medicine may cause increases or decreases in blood sugar. Signs of high blood sugar include frequent urination, unusual thirst, flushed or dry skin, difficulty breathing, drowsiness, stomach ache, nausea, vomiting or dry mouth. Signs of low blood sugar include chills, cool, pale skin or cold sweats, drowsiness, extreme hunger, fast heartbeat, headache, nausea, nervousness or anxiety, shakiness, trembling, unsteadiness, tiredness, or weakness. Contact your doctor or health care professional right away if you experience any of these symptoms.  What side effects may I notice from receiving this medicine? Side effects that you should report to your doctor or health care professional as soon as possible: -allergic reactions like skin rash, itching or hives, swelling of the face, lips, or tongue -changes in blood sugar -changes in heart rate -severe stomach pain Side effects that usually do not require medical attention (report to your doctor or health care professional if they continue or are bothersome): -diarrhea or constipation -gas or stomach pain -nausea, vomiting -pain, redness, swelling and irritation at site where injected  This list may not describe all possible side effects. Call your doctor for medical advice about side effects. You may report side effects to FDA at 1-800-FDA-1088.    

## 2012-04-05 MED ORDER — DIAZEPAM 5 MG PO TABS: 5.0000 mg | ORAL_TABLET | Freq: Four times a day (QID) | ORAL | Status: DC | PRN

## 2012-04-05 NOTE — Progress Notes (Signed)
CC:   [name], M.D. Electron Vida Rigger, MD, Fax [phone]  DIAGNOSES: 1. 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  CURRENT THERAPY: 1. Status post 3 cycles of Temodar/Xeloda. 2. Sandostatin LAR 30 g IM every month.  INTERIM HISTORY:  [name] comes in for his followup.  He is actually doing fairly well.  He has tolerated treatment nicely.  He still has this bloating.  He says the Sandostatin does seem to help.  I want to try him on some Amitiza (8 mcg p.o. b.i.d.) to see if this will help.  We did go ahead and do a repeat CT scan on him.  This was done back on 04/24.  The CT scan showed no evidence of disease within the chest.  He did have enhancing nodules within the thecal sac, which were felt to be hemangioblastomas which he has had in the abdomen and pelvis. He has had response in the liver with his liver metastases decreasing.  He probably has had about a 15% to 20% response.  He does have some enhancing renal lesions.  There does not appear to be anything that looks new.  We are following his chromogranin A level.  His last level back in early April was 26.  He has had a decent appetite.  His blood sugars are doing okay.  He is diabetic.  He has had no fevers.  He has had no bleeding.  He is paraplegic.  He does self-catheterize himself.  He has had no recent urine infections.  PHYSICAL EXAMINATION:  This is a fairly well-developed, well-nourished white gentleman in no obvious distress.  Vital signs:  96, pulse 95, respiratory 20, blood pressure 80/52.  Weight was not taken.  Head and neck:  Normocephalic, atraumatic skull.  There are no ocular or oral lesions.  There are no palpable cervical or supraclavicular lymph nodes. Lungs:  Clear bilaterally.  Cardiac: Regular rhythm with normal S1, S2. There are no murmurs, rubs or bruits.  Abdomen:  Soft with good bowel sounds.  There is no palpable abdominal mass.  There is no fluid  wave. There is no palpable hepatosplenomegaly megaly.  Back:  No tenderness over the spine, ribs, or hips.  Extremities: No clubbing, cyanosis or edema.  LABORATORY STUDIES:  White count 5.5, hemoglobin 15, hematocrit 45, platelet count 217.  LDH is 152.  Electrolytes show glucose of 354.  BUN and creatinine 26 and 1.02.  LFTs are normal.  Pre-albumin is 20.  IMPRESSION:  [name] is a 51 year old gentleman with metastatic neuroendocrine carcinomas.  He has a Von Hippel-Lindau syndrome.  He does go up to [facility] in, I think, a couple of weeks.  We did give his wife copies of the CT scans on disk so they can compare up them up in [place].  I really believe that his chemotherapy is helping him.  His blood sugars are on the high side right now so they will get this under control.  We will plan to get him back in another month.  He did get his Sandostatin today.    ______________________________ [name], M.D. PRE/MEDQ  D:  04/05/2012  T:  04/05/2012  Job:  [id number]

## 2012-04-05 NOTE — Progress Notes (Signed)
This office note has been dictated.

## 2012-04-08 LAB — COMPREHENSIVE METABOLIC PANEL
ALT: 27 U/L (ref 0–53)
AST: 25 U/L (ref 0–37)
Albumin: 4.2 g/dL (ref 3.5–5.2)
Alkaline Phosphatase: 164 U/L — ABNORMAL HIGH (ref 39–117)
BUN: 26 mg/dL — ABNORMAL HIGH (ref 6–23)
Potassium: 4.5 mEq/L (ref 3.5–5.3)
Sodium: 132 mEq/L — ABNORMAL LOW (ref 135–145)

## 2012-04-27 ENCOUNTER — Other Ambulatory Visit: Payer: Self-pay | Admitting: Hematology & Oncology

## 2012-05-02 ENCOUNTER — Other Ambulatory Visit (HOSPITAL_BASED_OUTPATIENT_CLINIC_OR_DEPARTMENT_OTHER): Payer: Commercial Managed Care - PPO | Admitting: Lab

## 2012-05-02 ENCOUNTER — Ambulatory Visit: Payer: 59 | Admitting: Hematology & Oncology

## 2012-05-02 ENCOUNTER — Other Ambulatory Visit: Payer: 59 | Admitting: Lab

## 2012-05-02 ENCOUNTER — Ambulatory Visit (HOSPITAL_BASED_OUTPATIENT_CLINIC_OR_DEPARTMENT_OTHER): Payer: Commercial Managed Care - PPO

## 2012-05-02 ENCOUNTER — Ambulatory Visit (HOSPITAL_BASED_OUTPATIENT_CLINIC_OR_DEPARTMENT_OTHER): Payer: Commercial Managed Care - PPO | Admitting: Hematology & Oncology

## 2012-05-02 VITALS — BP 84/56 | HR 88 | Temp 96.7°F | Ht 71.0 in

## 2012-05-02 DIAGNOSIS — C7A8 Other malignant neuroendocrine tumors: Secondary | ICD-10-CM

## 2012-05-02 DIAGNOSIS — E119 Type 2 diabetes mellitus without complications: Secondary | ICD-10-CM

## 2012-05-02 DIAGNOSIS — C7A00 Malignant carcinoid tumor of unspecified site: Secondary | ICD-10-CM | POA: Diagnosis not present

## 2012-05-02 DIAGNOSIS — Q858 Other phakomatoses, not elsewhere classified: Secondary | ICD-10-CM

## 2012-05-02 LAB — CBC WITH DIFFERENTIAL (CANCER CENTER ONLY)
BASO%: 0.6 % (ref 0.0–2.0)
Eosinophils Absolute: 0.1 10*3/uL (ref 0.0–0.5)
HCT: 43.3 % (ref 38.7–49.9)
LYMPH%: 24.4 % (ref 14.0–48.0)
MCV: 90 fL (ref 82–98)
MONO#: 0.6 10*3/uL (ref 0.1–0.9)
NEUT%: 62.4 % (ref 40.0–80.0)
RDW: 15.3 % (ref 11.1–15.7)
WBC: 5.3 10*3/uL (ref 4.0–10.0)

## 2012-05-02 MED ORDER — OCTREOTIDE ACETATE 30 MG IM KIT
30.0000 mg | PACK | Freq: Once | INTRAMUSCULAR | Status: AC
  Administered 2012-05-02: 30 mg via INTRAMUSCULAR

## 2012-05-02 NOTE — Progress Notes (Signed)
CC:   Ponciano C. [name], M.D.  DIAGNOSES: 1. 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  CURRENT THERAPY: 1. Patient status post 4 cycles of Temodar/Xeloda. 2. Sandostatin LAR 30 g IM every month.  INTERIM HISTORY:  Mr. Manon comes in for his followup.  He went to [place], [place].  From what he and his wife say, they are satisfied with how well he is doing.  Apparently, they really do not need to see him anymore up there unless necessary.  He is tolerating the Xeloda/Temodar pretty well.  His last chromogranin A level was down to 4.2 in April.  He still has some abdominal bloating.  We tried some Amitiza on him. This really did not help.  He is not having problems with his self-catheterizations.  He is not having diarrhea.  His stools are on the loose side.  He has had a decent appetite.  He has had no mouth sores.  He has had no cough.  He has had no bleeding.  PHYSICAL EXAM:  General:  This is a well-developed, well-nourished white gentleman in no obvious distress.  Vital signs:  96.7, pulse 88, respiratory rate 18, blood pressure 84/56.  Head and neck: Normocephalic, atraumatic skull.  There are no ocular or oral lesions. There are no palpable cervical or supraclavicular lymph nodes.  Lungs: Clear to percussion and auscultation bilaterally.  Cardiac:  Regular rate and rhythm with a normal S1, S2.  There are no murmurs, rubs or bruits.  Abdomen:  Soft with good bowel sounds.  There is no palpable abdominal mass.  No fluid wave.  There is no palpable hepatosplenomegaly.  His laparotomy scars are well healed.  Extremities: Show no clubbing, cyanosis or edema.  LABORATORY STUDIES:  White cell count 5.3, hemoglobin 14.1, hematocrit 43.3, platelet count 245.  IMPRESSION:  Mr. Hollenbeck is a 51 year old gentleman with metastatic neuroendocrine carcinoma.  He is on Temodar/Xeloda.  He is doing well with this.  We will go ahead and plan for his 5th cycle  of treatment.  We will probably plan for another CT scan after his 6th cycle.  His chromogranin A level is certainly encouraging.  I will see Mr. Domangue back in another month.    ______________________________ [name], M.D. PRE/MEDQ  D:  05/02/2012  T:  05/02/2012  Job:  [id number]

## 2012-05-02 NOTE — Progress Notes (Signed)
This office note has been dictated.

## 2012-05-09 LAB — COMPREHENSIVE METABOLIC PANEL
BUN: 18 mg/dL (ref 6–23)
CO2: 31 mEq/L (ref 19–32)
Calcium: 9 mg/dL (ref 8.4–10.5)
Chloride: 96 mEq/L (ref 96–112)
Creatinine, Ser: 0.82 mg/dL (ref 0.50–1.35)

## 2012-05-09 LAB — LACTATE DEHYDROGENASE: LDH: 120 U/L (ref 94–250)

## 2012-05-24 ENCOUNTER — Other Ambulatory Visit: Payer: Self-pay | Admitting: Hematology & Oncology

## 2012-05-30 ENCOUNTER — Ambulatory Visit (HOSPITAL_BASED_OUTPATIENT_CLINIC_OR_DEPARTMENT_OTHER): Payer: Commercial Managed Care - PPO | Admitting: Hematology & Oncology

## 2012-05-30 ENCOUNTER — Ambulatory Visit: Payer: Commercial Managed Care - PPO | Admitting: Hematology & Oncology

## 2012-05-30 ENCOUNTER — Ambulatory Visit (HOSPITAL_BASED_OUTPATIENT_CLINIC_OR_DEPARTMENT_OTHER): Payer: Commercial Managed Care - PPO

## 2012-05-30 ENCOUNTER — Other Ambulatory Visit (HOSPITAL_BASED_OUTPATIENT_CLINIC_OR_DEPARTMENT_OTHER): Payer: Commercial Managed Care - PPO | Admitting: Lab

## 2012-05-30 ENCOUNTER — Other Ambulatory Visit: Payer: Commercial Managed Care - PPO | Admitting: Lab

## 2012-05-30 ENCOUNTER — Ambulatory Visit: Payer: Commercial Managed Care - PPO

## 2012-05-30 VITALS — BP 98/67 | HR 85 | Temp 97.0°F | Ht 71.0 in

## 2012-05-30 DIAGNOSIS — C787 Secondary malignant neoplasm of liver and intrahepatic bile duct: Secondary | ICD-10-CM

## 2012-05-30 DIAGNOSIS — C7A00 Malignant carcinoid tumor of unspecified site: Secondary | ICD-10-CM

## 2012-05-30 DIAGNOSIS — G8220 Paraplegia, unspecified: Secondary | ICD-10-CM

## 2012-05-30 DIAGNOSIS — E119 Type 2 diabetes mellitus without complications: Secondary | ICD-10-CM

## 2012-05-30 DIAGNOSIS — E349 Endocrine disorder, unspecified: Secondary | ICD-10-CM

## 2012-05-30 DIAGNOSIS — Q8583 Von Hippel-Lindau syndrome: Secondary | ICD-10-CM

## 2012-05-30 DIAGNOSIS — C7A8 Other malignant neuroendocrine tumors: Secondary | ICD-10-CM

## 2012-05-30 DIAGNOSIS — Q858 Other phakomatoses, not elsewhere classified: Secondary | ICD-10-CM

## 2012-05-30 DIAGNOSIS — Q859 Phakomatosis, unspecified: Secondary | ICD-10-CM

## 2012-05-30 LAB — CBC WITH DIFFERENTIAL (CANCER CENTER ONLY)
BASO#: 0 10*3/uL (ref 0.0–0.2)
EOS%: 2.1 % (ref 0.0–7.0)
Eosinophils Absolute: 0.1 10*3/uL (ref 0.0–0.5)
HGB: 13.9 g/dL (ref 13.0–17.1)
LYMPH#: 1.2 10*3/uL (ref 0.9–3.3)
NEUT#: 3.8 10*3/uL (ref 1.5–6.5)
RBC: 4.75 10*6/uL (ref 4.20–5.70)

## 2012-05-30 MED ORDER — OCTREOTIDE ACETATE 30 MG IM KIT
30.0000 mg | PACK | Freq: Once | INTRAMUSCULAR | Status: AC
  Administered 2012-05-30: 30 mg via INTRAMUSCULAR

## 2012-05-30 NOTE — Patient Instructions (Signed)

## 2012-05-30 NOTE — Progress Notes (Signed)
This office note has been dictated.

## 2012-05-31 ENCOUNTER — Telehealth: Payer: Self-pay | Admitting: Hematology & Oncology

## 2012-05-31 NOTE — Telephone Encounter (Signed)
Pt aware of 7-17 CT to drink at 9 and 10 am and to be NPO 4 hours. Pt also aware of 7-23 MD

## 2012-05-31 NOTE — Progress Notes (Signed)
CC:   Braxon C. [name], M.D. Electron Vida Rigger, MD, Fax [phone]  DIAGNOSES: 1. 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  CURRENT THERAPY: 1. The patient is status post 4 cycles of Temodar/Xeloda. 2. Sandostatin LAR 30 g IM every month.  INTERIM HISTORY:  Mr. Calvert comes in for his followup.  He is doing okay.  He is not having any pain issues.  He still has some bloating. This seems to be worse when he is sitting up.  He is complaining of some night sweats.  I am going to check a testosterone level on him.  He has had no problems with diarrhea.  He has had no mouth sores.  He has had no bleeding.  There has been no cough.  He gets around in his wheelchair without any issues.  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  His last chromogranin A level back in September was 18.  PHYSICAL EXAM:  General:  This is a well-developed, well-nourished white gentleman in no obvious distress.  Vital signs:  Show temperature of 97, pulse 85, respiratory rate 18, blood pressure 98/67.  Head and neck: Normocephalic, atraumatic skull.  There are no ocular or oral lesions. There are no palpable cervical or supraclavicular lymph nodes.  Lungs: Clear to percussion and auscultation bilaterally.  Cardiac:  Regular rhythm with a normal S1 and S2.  There are no murmurs, rubs or bruits. Abdomen:  Soft with good bowel sounds.  He has laparotomy scars that are well-healed.  There is no fluid wave.  There is no guarding or rebound tenderness.  There is no palpable hepatosplenomegaly.  Back:  No tenderness over the spine, ribs, or hips.  Extremities:  No clubbing, cyanosis or edema.  Skin:  Does show a little bit of erythema on his forehead.  LABORATORY STUDIES:  White cell count is 5.7, hemoglobin 14, hematocrit 43, platelet count 220.  IMPRESSION:  Mr. Cid is a 51 year old gentleman with metastatic neuroendocrine carcinoma.  This is coming from his von  Hippel-Lindau syndrome.  So far, he has responded to Temodar/Xeloda.  We will go ahead and continue him on this.  He has tolerated treatment pretty well.  I will plan for another set of CT scans to be done in mid-July.  His last set of scans were done back in April.  Once he has his scans, we will plan to get him back for followup.  If his testosterone level is low, we will go ahead and plan to give him some testosterone supplementation.    ______________________________ [name], M.D. PRE/MEDQ  D:  05/30/2012  T:  05/30/2012  Job:  [id number]

## 2012-06-03 LAB — COMPREHENSIVE METABOLIC PANEL
AST: 31 U/L (ref 0–37)
Albumin: 4.1 g/dL (ref 3.5–5.2)
BUN: 17 mg/dL (ref 6–23)
Calcium: 9 mg/dL (ref 8.4–10.5)
Chloride: 97 mEq/L (ref 96–112)
Glucose, Bld: 162 mg/dL — ABNORMAL HIGH (ref 70–99)
Potassium: 4.3 mEq/L (ref 3.5–5.3)
Total Protein: 6.9 g/dL (ref 6.0–8.3)

## 2012-06-03 LAB — TESTOSTERONE: Testosterone: 340.89 ng/dL (ref 300–890)

## 2012-06-07 ENCOUNTER — Other Ambulatory Visit: Payer: Self-pay | Admitting: Hematology & Oncology

## 2012-06-20 DIAGNOSIS — Z8589 Personal history of malignant neoplasm of other organs and systems: Secondary | ICD-10-CM | POA: Diagnosis not present

## 2012-06-20 DIAGNOSIS — Q859 Phakomatosis, unspecified: Secondary | ICD-10-CM | POA: Diagnosis not present

## 2012-06-20 DIAGNOSIS — D1809 Hemangioma of other sites: Secondary | ICD-10-CM | POA: Diagnosis not present

## 2012-06-20 DIAGNOSIS — Z9889 Other specified postprocedural states: Secondary | ICD-10-CM | POA: Diagnosis not present

## 2012-06-20 DIAGNOSIS — H3589 Other specified retinal disorders: Secondary | ICD-10-CM | POA: Diagnosis not present

## 2012-06-21 ENCOUNTER — Other Ambulatory Visit: Payer: Self-pay | Admitting: Hematology & Oncology

## 2012-06-21 ENCOUNTER — Ambulatory Visit (HOSPITAL_COMMUNITY)
Admission: RE | Admit: 2012-06-21 | Discharge: 2012-06-21 | Disposition: A | Discharge status: Home / Self Care | Payer: 59 | Source: Ambulatory Visit | Attending: Hematology & Oncology | Admitting: Hematology & Oncology

## 2012-06-21 DIAGNOSIS — K4090 Unilateral inguinal hernia, without obstruction or gangrene, not specified as recurrent: Secondary | ICD-10-CM | POA: Insufficient documentation

## 2012-06-21 DIAGNOSIS — C787 Secondary malignant neoplasm of liver and intrahepatic bile duct: Secondary | ICD-10-CM | POA: Diagnosis not present

## 2012-06-21 DIAGNOSIS — Z90410 Acquired total absence of pancreas: Secondary | ICD-10-CM | POA: Insufficient documentation

## 2012-06-21 DIAGNOSIS — Z79899 Other long term (current) drug therapy: Secondary | ICD-10-CM | POA: Insufficient documentation

## 2012-06-21 DIAGNOSIS — I700 Atherosclerosis of aorta: Secondary | ICD-10-CM | POA: Insufficient documentation

## 2012-06-21 DIAGNOSIS — C7A8 Other malignant neuroendocrine tumors: Secondary | ICD-10-CM

## 2012-06-21 DIAGNOSIS — D7389 Other diseases of spleen: Secondary | ICD-10-CM | POA: Insufficient documentation

## 2012-06-21 DIAGNOSIS — Z9089 Acquired absence of other organs: Secondary | ICD-10-CM | POA: Insufficient documentation

## 2012-06-21 DIAGNOSIS — C7A00 Malignant carcinoid tumor of unspecified site: Secondary | ICD-10-CM | POA: Insufficient documentation

## 2012-06-21 DIAGNOSIS — N289 Disorder of kidney and ureter, unspecified: Secondary | ICD-10-CM | POA: Insufficient documentation

## 2012-06-21 DIAGNOSIS — J90 Pleural effusion, not elsewhere classified: Secondary | ICD-10-CM | POA: Diagnosis not present

## 2012-06-21 DIAGNOSIS — M539 Dorsopathy, unspecified: Secondary | ICD-10-CM | POA: Insufficient documentation

## 2012-06-21 MED ORDER — IOHEXOL 300 MG/ML  SOLN
100.0000 mL | Freq: Once | INTRAMUSCULAR | Status: AC | PRN
  Administered 2012-06-21: 100 mL via INTRAVENOUS

## 2012-06-27 ENCOUNTER — Ambulatory Visit (HOSPITAL_BASED_OUTPATIENT_CLINIC_OR_DEPARTMENT_OTHER): Payer: Commercial Managed Care - PPO

## 2012-06-27 ENCOUNTER — Other Ambulatory Visit: Payer: Commercial Managed Care - PPO | Admitting: Lab

## 2012-06-27 ENCOUNTER — Ambulatory Visit (HOSPITAL_BASED_OUTPATIENT_CLINIC_OR_DEPARTMENT_OTHER): Payer: Commercial Managed Care - PPO | Admitting: Hematology & Oncology

## 2012-06-27 ENCOUNTER — Ambulatory Visit: Payer: Commercial Managed Care - PPO | Admitting: Hematology & Oncology

## 2012-06-27 ENCOUNTER — Ambulatory Visit (HOSPITAL_BASED_OUTPATIENT_CLINIC_OR_DEPARTMENT_OTHER): Payer: Commercial Managed Care - PPO | Admitting: Lab

## 2012-06-27 VITALS — BP 106/71 | HR 76 | Temp 96.7°F | Ht 71.0 in

## 2012-06-27 DIAGNOSIS — C7A00 Malignant carcinoid tumor of unspecified site: Secondary | ICD-10-CM

## 2012-06-27 DIAGNOSIS — C787 Secondary malignant neoplasm of liver and intrahepatic bile duct: Secondary | ICD-10-CM

## 2012-06-27 DIAGNOSIS — G8220 Paraplegia, unspecified: Secondary | ICD-10-CM

## 2012-06-27 DIAGNOSIS — C7A8 Other malignant neuroendocrine tumors: Secondary | ICD-10-CM

## 2012-06-27 DIAGNOSIS — Q859 Phakomatosis, unspecified: Secondary | ICD-10-CM

## 2012-06-27 LAB — CBC WITH DIFFERENTIAL (CANCER CENTER ONLY)
BASO%: 0.6 % (ref 0.0–2.0)
EOS%: 1.7 % (ref 0.0–7.0)
HCT: 43.2 % (ref 38.7–49.9)
LYMPH%: 20.4 % (ref 14.0–48.0)
MCH: 29.4 pg (ref 28.0–33.4)
MCHC: 32.4 g/dL (ref 32.0–35.9)
MCV: 91 fL (ref 82–98)
NEUT%: 65.5 % (ref 40.0–80.0)
RDW: 15.7 % (ref 11.1–15.7)

## 2012-06-27 MED ORDER — OCTREOTIDE ACETATE 30 MG IM KIT
30.0000 mg | PACK | Freq: Once | INTRAMUSCULAR | Status: AC
  Administered 2012-06-27: 30 mg via INTRAMUSCULAR

## 2012-06-27 NOTE — Progress Notes (Signed)
This office note has been dictated.

## 2012-06-28 ENCOUNTER — Encounter: Payer: Self-pay | Admitting: *Deleted

## 2012-06-28 NOTE — Progress Notes (Signed)
CC:   Loni C. [name], M.D. Electron Vida Rigger, MD, Fax [phone]  DIAGNOSES: 1. 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  CURRENT THERAPY: 1. The patient is status post 5 cycles of Temodar/Xeloda. 2. Sandostatin LAR 30 g IM every month.  INTERIM HISTORY:  Mr. Prindle comes in for his followup.  He is doing pretty well.  He has not had a lot of sweats.  We did check his testosterone level last time when we saw him.  It was 341.  His chromogranin A level was only 3.  His last CT scans, however, did show some mild progression in his liver. Everything else looked relatively stable.  I think his liver progression is probably about 15% to 20% overall.  I would probably categorize this as stable.  Most important is that there are no new lesions.  His blood sugar has been okay.  He has had no nausea or vomiting.  He has had no diarrhea.  He has had no urinary issues.  Overall, his performance status is ECOG 1.  He really has done nicely. He has had a good quality of life.  PHYSICAL EXAMINATION:  General:  This is a well-developed, well- nourished white gentleman in no obvious distress.  Vital Signs:  Show a temperature of 96.5, pulse 76, respiratory rate 18, blood pressure 106/71.  Weight was not taken.  Head and Neck Exam:  Shows a normocephalic, atraumatic skull.  There are no ocular or oral lesions. There are no palpable cervical or supraclavicular lymph nodes.  Lungs: Clear bilaterally.  Cardiac Exam:  Regular rate and rhythm with a normal S1 and S2.  There are no murmurs, rubs, or bruits.  Abdominal Exam: Soft.  He has good bowel sounds.  He has laparotomy scars that are well- healed.  He has no fluid wave.  There is no guarding or rebound tenderness.  His liver edge is just below the right costal margin.  I cannot palpate his spleen tip.  Back:  No tenderness over the  spine, ribs, or hips.  Extremities:  Show the paraplegia in his legs.  There may have been some slight chronic pitting edema in his legs.  Skin Exam: Shows no unusual rashes.  LABORATORY STUDIES:  White cell count 5.4, hemoglobin 14, hematocrit 43, platelet count 228.  IMPRESSION:  Mr. Keadle is a 51 year old gentleman with von Hippel- Lindau syndrome.  He has multiple malignancies.  He is relatively stable with respect to his CT scans.  His quality of life and performance status are quite good.  I still think we have to continue with our present regimen.  Again, we have not seen any progression or any new lesions which to me would necessitate a change in therapy.  We will go ahead and plan another CT scan in 2 months now.  I do want to maintain close contact with Mr. Seelman and follow him closely so that we can make changes if indicated.  I am just glad to see his quality of life is doing well.    ______________________________ [name], M.D. PRE/MEDQ  D:  06/27/2012  T:  06/27/2012  Job:  [id number]

## 2012-06-29 LAB — COMPREHENSIVE METABOLIC PANEL
ALT: 28 U/L (ref 0–53)
AST: 35 U/L (ref 0–37)
CO2: 28 mEq/L (ref 19–32)
Creatinine, Ser: 0.83 mg/dL (ref 0.50–1.35)
Total Bilirubin: 0.5 mg/dL (ref 0.3–1.2)

## 2012-06-29 LAB — COLD AGGLUTININ TITER

## 2012-07-11 ENCOUNTER — Other Ambulatory Visit: Payer: Self-pay | Admitting: *Deleted

## 2012-07-11 DIAGNOSIS — N390 Urinary tract infection, site not specified: Secondary | ICD-10-CM

## 2012-07-11 DIAGNOSIS — C7A8 Other malignant neuroendocrine tumors: Secondary | ICD-10-CM

## 2012-07-11 MED ORDER — AMOXICILLIN-POT CLAVULANATE 875-125 MG PO TABS: 1.0000 | ORAL_TABLET | Freq: Two times a day (BID) | ORAL | Status: DC

## 2012-07-11 NOTE — Telephone Encounter (Signed)
Pt called with c/o abd, low back/kidney pain & int nausea since Saturday. He thinks he is developing another UTI. Reviewed by Dr Myna Hidalgo. To try to obtain a urine cx at home then start Augmentin 875-125 mg after obtaining cx. Pt thinks they have a few sterile cups at the house. To place in refrigerator after obtaining & have his wife bring in the office tomorrow. Rx sent to [facility] via [facility].

## 2012-07-12 ENCOUNTER — Other Ambulatory Visit (HOSPITAL_BASED_OUTPATIENT_CLINIC_OR_DEPARTMENT_OTHER): Payer: Commercial Managed Care - PPO | Admitting: Lab

## 2012-07-12 ENCOUNTER — Other Ambulatory Visit: Payer: Self-pay | Admitting: *Deleted

## 2012-07-12 DIAGNOSIS — R300 Dysuria: Secondary | ICD-10-CM

## 2012-07-12 DIAGNOSIS — M549 Dorsalgia, unspecified: Secondary | ICD-10-CM

## 2012-07-12 LAB — URINALYSIS, MICROSCOPIC (CHCC SATELLITE)
Bilirubin (Urine): NEGATIVE
Glucose: NEGATIVE g/dL
Nitrite: POSITIVE

## 2012-07-17 ENCOUNTER — Other Ambulatory Visit: Payer: Self-pay | Admitting: *Deleted

## 2012-07-18 ENCOUNTER — Ambulatory Visit: Payer: Commercial Managed Care - PPO

## 2012-07-18 ENCOUNTER — Other Ambulatory Visit: Payer: Self-pay | Admitting: Hematology & Oncology

## 2012-07-18 ENCOUNTER — Other Ambulatory Visit: Payer: Self-pay | Admitting: *Deleted

## 2012-07-18 DIAGNOSIS — R197 Diarrhea, unspecified: Secondary | ICD-10-CM

## 2012-07-18 MED ORDER — METRONIDAZOLE 500 MG PO TABS: 500.0000 mg | ORAL_TABLET | Freq: Three times a day (TID) | ORAL | Status: AC

## 2012-07-18 NOTE — Telephone Encounter (Signed)
Pt's wife called stating that he was developing significant diarrhea and wanted to know if his Gentamicin infusion could be held off until tomorrow. Reviewed with Dr Myna Hidalgo. Ok to start Gentamicin tomorrow with [facility] but in the mean time will need to start Flagyl 500 mg tid x 10 days for probable c-diff. Contacted [facility] pharmacy to inform them of the change. They will provide him with a total of 5 days worth of IV Gentamicin at home.

## 2012-07-24 ENCOUNTER — Other Ambulatory Visit (HOSPITAL_BASED_OUTPATIENT_CLINIC_OR_DEPARTMENT_OTHER): Payer: Medicare Other | Admitting: Lab

## 2012-07-24 ENCOUNTER — Ambulatory Visit (HOSPITAL_BASED_OUTPATIENT_CLINIC_OR_DEPARTMENT_OTHER): Payer: 59

## 2012-07-24 ENCOUNTER — Ambulatory Visit (HOSPITAL_BASED_OUTPATIENT_CLINIC_OR_DEPARTMENT_OTHER): Payer: Medicare Other | Admitting: Hematology & Oncology

## 2012-07-24 VITALS — BP 71/55 | HR 80 | Temp 97.4°F | Resp 20 | Ht 71.0 in

## 2012-07-24 DIAGNOSIS — C7A00 Malignant carcinoid tumor of unspecified site: Secondary | ICD-10-CM

## 2012-07-24 DIAGNOSIS — N390 Urinary tract infection, site not specified: Secondary | ICD-10-CM | POA: Diagnosis not present

## 2012-07-24 DIAGNOSIS — C7A098 Malignant carcinoid tumors of other sites: Secondary | ICD-10-CM | POA: Diagnosis not present

## 2012-07-24 DIAGNOSIS — C7A8 Other malignant neuroendocrine tumors: Secondary | ICD-10-CM

## 2012-07-24 DIAGNOSIS — Q859 Phakomatosis, unspecified: Secondary | ICD-10-CM | POA: Diagnosis not present

## 2012-07-24 DIAGNOSIS — C787 Secondary malignant neoplasm of liver and intrahepatic bile duct: Secondary | ICD-10-CM | POA: Diagnosis not present

## 2012-07-24 LAB — CBC WITH DIFFERENTIAL (CANCER CENTER ONLY)
Eosinophils Absolute: 0.1 10*3/uL (ref 0.0–0.5)
HCT: 42.5 % (ref 38.7–49.9)
LYMPH%: 20.7 % (ref 14.0–48.0)
MCH: 29.5 pg (ref 28.0–33.4)
MCV: 90 fL (ref 82–98)
MONO#: 0.7 10*3/uL (ref 0.1–0.9)
MONO%: 13.5 % — ABNORMAL HIGH (ref 0.0–13.0)
NEUT%: 63.6 % (ref 40.0–80.0)
RBC: 4.75 10*6/uL (ref 4.20–5.70)
RDW: 16.6 % — ABNORMAL HIGH (ref 11.1–15.7)
WBC: 4.9 10*3/uL (ref 4.0–10.0)

## 2012-07-24 LAB — URINALYSIS, MICROSCOPIC (CHCC SATELLITE)
Bilirubin (Urine): NEGATIVE
Nitrite: NEGATIVE

## 2012-07-24 LAB — CMP (CANCER CENTER ONLY)
Albumin: 3.2 g/dL — ABNORMAL LOW (ref 3.3–5.5)
BUN, Bld: 13 mg/dL (ref 7–22)
CO2: 30 mEq/L (ref 18–33)
Calcium: 8.2 mg/dL (ref 8.0–10.3)
Chloride: 91 mEq/L — ABNORMAL LOW (ref 98–108)
Glucose, Bld: 305 mg/dL — ABNORMAL HIGH (ref 73–118)
Potassium: 4.4 mEq/L (ref 3.3–4.7)
Sodium: 133 mEq/L (ref 128–145)
Total Protein: 7.3 g/dL (ref 6.4–8.1)

## 2012-07-24 MED ORDER — OCTREOTIDE ACETATE 30 MG IM KIT
30.0000 mg | PACK | Freq: Once | INTRAMUSCULAR | Status: AC
  Administered 2012-07-24: 30 mg via INTRAMUSCULAR

## 2012-07-24 NOTE — Progress Notes (Signed)
This office note has been dictated.

## 2012-07-25 NOTE — Progress Notes (Signed)
CC:   [name], M.D. Electron Vida Rigger, MD, Fax [phone]  DIAGNOSES: 1. 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  CURRENT THERAPY: 1. Status post 6 cycles of Temodar/Xeloda. 2. Sandostatin LAR 30 g IM q.month.  INTERIM HISTORY:  [name] comes in for followup.  He has some recent positive UTI.  He had E coli and pseudomonas in his urine.  We did put him on gentamicin as an outpatient.  His urine he says is still quite cloudy.  He did have some diarrhea.  It was C difficile.  He is on Flagyl for this.  He says he feels better.  The diarrhea is improving quite a bit.  The abdominal pain has resolved.  He has had no nausea.  He has done well with the Temodar and Xeloda.  He starts his seventh cycle today.  I want to scan him after this so I can see how his tumors are responding.  PHYSICAL EXAMINATION:  General:  This is a fairly well-developed, well- nourished white gentleman in no obvious distress.  Vital signs:  97.4, pulse 80, respiratory rate 18, blood pressure 71/55.  Head and neck: Shows a normocephalic, atraumatic skull.  His oral mucosa is moist. Neck:  He has no adenopathy in his neck.  Lungs:  Slightly decreased over on the right side.  Left side is clear.  Cardiac:  Regular rate and rhythm with a normal S1 and S2.  There are no murmurs, rubs or bruits. Abdomen:  Soft with good bowel sounds.  There is no palpable abdominal mass.  He has well-healed laparotomy scars.  There is no hepatosplenomegaly.  Extremities:  Shows the paraplegia in his lower extremities.  LABORATORY STUDIES:  White cell count is 4.9, hemoglobin 14, hematocrit 43, platelet count 211.  His BUN and creatinine are 13 and 0.8.  Glucose is 205.  Potassium is 4.4.  IMPRESSION:  [name] is a 51 year old gentleman with metastatic neuroendocrine carcinomas.  He has von Hippel-Lindau syndrome.  He does have extensive liver disease.  We will go ahead and repeat  his scans in about a month.  Of note, his last chromogranin A was 3.0.  We will go ahead and get him back in another month or so and see what his scans look like.    ______________________________ [name], M.D. PRE/MEDQ  D:  07/24/2012  T:  07/24/2012  Job:  [id number]

## 2012-07-26 ENCOUNTER — Encounter: Payer: Self-pay | Admitting: *Deleted

## 2012-07-26 NOTE — Progress Notes (Signed)
Per Dr. Myna Hidalgo, pt called and told that his Urine culture only showed yeast and he is on Diflucan for that.  Pt voiced understanding.

## 2012-07-28 ENCOUNTER — Other Ambulatory Visit: Payer: Self-pay | Admitting: *Deleted

## 2012-07-28 ENCOUNTER — Other Ambulatory Visit: Payer: Self-pay | Admitting: Hematology & Oncology

## 2012-07-28 DIAGNOSIS — A0472 Enterocolitis due to Clostridium difficile, not specified as recurrent: Secondary | ICD-10-CM

## 2012-07-28 MED ORDER — VANCOMYCIN HCL 125 MG PO CAPS: 125.0000 mg | ORAL_CAPSULE | Freq: Four times a day (QID) | ORAL | Status: DC

## 2012-08-14 ENCOUNTER — Other Ambulatory Visit: Payer: Self-pay | Admitting: *Deleted

## 2012-08-14 DIAGNOSIS — A0472 Enterocolitis due to Clostridium difficile, not specified as recurrent: Secondary | ICD-10-CM

## 2012-08-14 MED ORDER — VANCOMYCIN HCL 125 MG PO CAPS: 125.0000 mg | ORAL_CAPSULE | Freq: Four times a day (QID) | ORAL | Status: DC

## 2012-08-14 MED ORDER — VANCOMYCIN HCL 125 MG PO CAPS: 125.0000 mg | ORAL_CAPSULE | Freq: Four times a day (QID) | ORAL | Status: AC

## 2012-08-14 NOTE — Telephone Encounter (Signed)
Call from pt stated he still having diarrhea. Pt still have C-diff request to have refilled for vancomycin. Consult with dr. Myna Hidalgo ok to refilled vancomycin. Vancomycin E-prescribed. Pt notified with verbalized understanding.  Rx. Was fax. Unable to e-prescribed.

## 2012-08-21 ENCOUNTER — Ambulatory Visit (HOSPITAL_COMMUNITY)
Admission: RE | Admit: 2012-08-21 | Discharge: 2012-08-21 | Disposition: A | Discharge status: Home / Self Care | Payer: 59 | Source: Ambulatory Visit | Attending: Hematology & Oncology | Admitting: Hematology & Oncology

## 2012-08-21 ENCOUNTER — Other Ambulatory Visit: Payer: Self-pay | Admitting: Hematology & Oncology

## 2012-08-21 DIAGNOSIS — Z79899 Other long term (current) drug therapy: Secondary | ICD-10-CM | POA: Diagnosis not present

## 2012-08-21 DIAGNOSIS — C759 Malignant neoplasm of endocrine gland, unspecified: Secondary | ICD-10-CM | POA: Diagnosis not present

## 2012-08-21 DIAGNOSIS — C787 Secondary malignant neoplasm of liver and intrahepatic bile duct: Secondary | ICD-10-CM | POA: Insufficient documentation

## 2012-08-21 DIAGNOSIS — C7A8 Other malignant neuroendocrine tumors: Secondary | ICD-10-CM

## 2012-08-21 DIAGNOSIS — M899 Disorder of bone, unspecified: Secondary | ICD-10-CM | POA: Insufficient documentation

## 2012-08-21 DIAGNOSIS — R599 Enlarged lymph nodes, unspecified: Secondary | ICD-10-CM | POA: Insufficient documentation

## 2012-08-21 DIAGNOSIS — N289 Disorder of kidney and ureter, unspecified: Secondary | ICD-10-CM | POA: Insufficient documentation

## 2012-08-21 MED ORDER — IOHEXOL 300 MG/ML  SOLN
100.0000 mL | Freq: Once | INTRAMUSCULAR | Status: AC | PRN
  Administered 2012-08-21: 100 mL via INTRAVENOUS

## 2012-08-22 ENCOUNTER — Other Ambulatory Visit (HOSPITAL_BASED_OUTPATIENT_CLINIC_OR_DEPARTMENT_OTHER): Payer: 59 | Admitting: Lab

## 2012-08-22 ENCOUNTER — Ambulatory Visit (HOSPITAL_BASED_OUTPATIENT_CLINIC_OR_DEPARTMENT_OTHER): Payer: 59 | Admitting: Hematology & Oncology

## 2012-08-22 ENCOUNTER — Ambulatory Visit (HOSPITAL_BASED_OUTPATIENT_CLINIC_OR_DEPARTMENT_OTHER): Payer: 59

## 2012-08-22 VITALS — BP 96/64 | HR 79 | Temp 97.6°F | Resp 18 | Ht 69.0 in

## 2012-08-22 DIAGNOSIS — C787 Secondary malignant neoplasm of liver and intrahepatic bile duct: Secondary | ICD-10-CM | POA: Diagnosis not present

## 2012-08-22 DIAGNOSIS — C7A8 Other malignant neuroendocrine tumors: Secondary | ICD-10-CM

## 2012-08-22 DIAGNOSIS — C7A00 Malignant carcinoid tumor of unspecified site: Secondary | ICD-10-CM

## 2012-08-22 DIAGNOSIS — G8220 Paraplegia, unspecified: Secondary | ICD-10-CM | POA: Diagnosis not present

## 2012-08-22 DIAGNOSIS — Q859 Phakomatosis, unspecified: Secondary | ICD-10-CM | POA: Diagnosis not present

## 2012-08-22 LAB — CMP (CANCER CENTER ONLY)
ALT(SGPT): 31 U/L (ref 10–47)
CO2: 31 mEq/L (ref 18–33)
Creat: 0.9 mg/dl (ref 0.6–1.2)
Total Bilirubin: 0.6 mg/dl (ref 0.20–1.60)

## 2012-08-22 LAB — CBC WITH DIFFERENTIAL (CANCER CENTER ONLY)
BASO%: 0.6 % (ref 0.0–2.0)
LYMPH%: 28.9 % (ref 14.0–48.0)
MCH: 29.4 pg (ref 28.0–33.4)
MCHC: 32.9 g/dL (ref 32.0–35.9)
MCV: 89 fL (ref 82–98)
MONO%: 11.6 % (ref 0.0–13.0)
Platelets: 215 10*3/uL (ref 145–400)
RDW: 17.4 % — ABNORMAL HIGH (ref 11.1–15.7)

## 2012-08-22 MED ORDER — OCTREOTIDE ACETATE 30 MG IM KIT
30.0000 mg | PACK | Freq: Once | INTRAMUSCULAR | Status: AC
  Administered 2012-08-22: 30 mg via INTRAMUSCULAR

## 2012-08-22 MED ORDER — GENTAMICIN SULFATE 40 MG/ML IJ SOLN: 330.0000 mg | Freq: Once | INTRAVENOUS | Status: DC

## 2012-08-22 NOTE — Progress Notes (Signed)
This office note has been dictated.

## 2012-08-22 NOTE — Patient Instructions (Signed)
Call with any problems

## 2012-08-23 NOTE — Progress Notes (Signed)
CC:   [name], M.D. Electron Vida Rigger, MD, Fax [phone]  DIAGNOSES: 1. 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  CURRENT THERAPY: 1. The patient is status post 7 cycles of Temodar/Xeloda. 2. Sandostatin LAR 30 g IM every month.  INTERIM HISTORY:  [name] comes in for followup.  He is still doing well.  His urine is doing better.  He was treated for a urinary tract infection as an outpatient.  He was on gentamicin.  He is having some problems with diarrhea.  He has had issues with Clostridium difficile in the past.  He is going to finish up some oral vancomycin tomorrow.  We did go ahead and do a CT scan of his chest, abdomen, and pelvis. This was done on the 16th.  The CT scan showed no evidence of disease in the chest.  He did have some minimal growth within the liver.  There is no new disease noted within the liver.  He had some kidney lesions which were stable.  There was no lymphadenopathy present.  No bony lesions were noted.  He had stable enhancing lesions within the spinal canal.  He has had no cough.  He has had some abdominal spasms which have been chronic.  When we last checked his chromogranin A level, it was 4.2 back in August.  The Sandostatin seems to be helping him.  He has had no bleeding.  He has had no problems with leg swelling. Again, he is in paraplegia secondary to spinal cord involvement by his malignancy.  PHYSICAL EXAMINATION:  General:  This is a well-developed, well- nourished white gentleman in no obvious distress.  Vital Signs: Temperature 97.6, pulse 79, respiratory rate 18, blood pressure is 96/64.  Head and Neck Exam:  Shows a normocephalic, atraumatic skull. There are no ocular or oral lesions.  There are no palpable cervical or supraclavicular lymph nodes.  Lungs:  Clear bilaterally.  Cardiac Exam: Regular rate and rhythm with a normal S1 and S2.  There are no murmurs, rubs, or bruits.  Abdomen:   Soft.  He has decent bowel sounds.  There is no fluid wave.  There is no palpable hepatosplenomegaly.  Back Exam:  No tenderness over the spine.  Extremities:  Show the paralyzed legs. There is no obvious swelling.  LABORATORY STUDIES:  Show a white cell count of 5, hemoglobin 15, hematocrit 45, platelet count 215.  Sodium is 141, potassium 5.1, BUN 16, creatinine 0.9.  Liver function tests are okay.  IMPRESSION:  [name] is a 51 year old gentleman with metastatic neuroendocrine carcinoma.  He does have the von Hippel-Lindau syndrome.  It appears that the only site of "activity" is the liver.  As such, I am thinking that he may benefit from another dose of yttrium into the liver.  He has had a dose previously.  This was about 2 or 3 years ago.  We will call interventional radiology to see if they feel that he would benefit from another yttrium therapy.  Otherwise, I think that the Temodar/Xeloda combination is helping, as his disease outside of the liver has remained stable.  He got his Sandostatin today, which is helping with some of his symptoms.  We will plan to get him back in another month.    ______________________________ [name], M.D. PRE/MEDQ  D:  08/22/2012  T:  08/22/2012  Job:  [id number]

## 2012-08-25 LAB — CHROMOGRANIN A: Chromogranin A: 4.8 ng/mL (ref 1.9–15.0)

## 2012-08-29 ENCOUNTER — Other Ambulatory Visit (HOSPITAL_COMMUNITY): Payer: 59

## 2012-09-01 ENCOUNTER — Telehealth: Payer: Self-pay | Admitting: *Deleted

## 2012-09-01 ENCOUNTER — Other Ambulatory Visit: Payer: 59 | Admitting: Lab

## 2012-09-01 DIAGNOSIS — C7A8 Other malignant neuroendocrine tumors: Secondary | ICD-10-CM

## 2012-09-01 NOTE — Telephone Encounter (Signed)
Patient called saying he still has diarrhea and Cdiff even after taking Vancomycin for 2 weeks.  Talked to Dr. Lupita Leash.  He wants to do stool sample and if it does show CDiff then we need to refer him to GI doctor.  Patient will bring stool sample in today.

## 2012-09-04 ENCOUNTER — Ambulatory Visit: Payer: 59 | Admitting: Hematology & Oncology

## 2012-09-04 ENCOUNTER — Ambulatory Visit: Payer: 59

## 2012-09-04 ENCOUNTER — Encounter: Payer: Self-pay | Admitting: *Deleted

## 2012-09-04 ENCOUNTER — Other Ambulatory Visit: Payer: Medicare Other | Admitting: Lab

## 2012-09-04 NOTE — Progress Notes (Unsigned)
Pt called and told his stool culture from 09/01/12 was positive for C-Diff.  Per Dr. Myna Hidalgo, tried to get appt with pt's GI MD but he was unable to see pt before 09/11/12.  Dr. Myna Hidalgo prescribed Dificid and this was called to [facility].

## 2012-09-08 ENCOUNTER — Telehealth: Payer: Self-pay | Admitting: Hematology & Oncology

## 2012-09-08 NOTE — Telephone Encounter (Signed)
[name] from [facility] called I faxed lab and last office note to them for 10-7 appointment [phone]

## 2012-09-19 ENCOUNTER — Ambulatory Visit (HOSPITAL_BASED_OUTPATIENT_CLINIC_OR_DEPARTMENT_OTHER): Payer: 59 | Admitting: Hematology & Oncology

## 2012-09-19 ENCOUNTER — Other Ambulatory Visit (HOSPITAL_BASED_OUTPATIENT_CLINIC_OR_DEPARTMENT_OTHER): Payer: 59 | Admitting: Lab

## 2012-09-19 ENCOUNTER — Ambulatory Visit (HOSPITAL_BASED_OUTPATIENT_CLINIC_OR_DEPARTMENT_OTHER): Payer: 59

## 2012-09-19 VITALS — BP 94/65 | HR 86 | Temp 97.4°F | Resp 16 | Ht 69.0 in

## 2012-09-19 DIAGNOSIS — C7A8 Other malignant neuroendocrine tumors: Secondary | ICD-10-CM

## 2012-09-19 DIAGNOSIS — C7A00 Malignant carcinoid tumor of unspecified site: Secondary | ICD-10-CM | POA: Diagnosis not present

## 2012-09-19 DIAGNOSIS — C787 Secondary malignant neoplasm of liver and intrahepatic bile duct: Secondary | ICD-10-CM

## 2012-09-19 DIAGNOSIS — Q859 Phakomatosis, unspecified: Secondary | ICD-10-CM | POA: Diagnosis not present

## 2012-09-19 LAB — CBC WITH DIFFERENTIAL (CANCER CENTER ONLY)
BASO#: 0 10*3/uL (ref 0.0–0.2)
Eosinophils Absolute: 0 10*3/uL (ref 0.0–0.5)
HGB: 15.1 g/dL (ref 13.0–17.1)
LYMPH#: 0.9 10*3/uL (ref 0.9–3.3)
MCH: 30 pg (ref 28.0–33.4)
MONO#: 0.6 10*3/uL (ref 0.1–0.9)
MONO%: 5.9 % (ref 0.0–13.0)
NEUT#: 8.7 10*3/uL — ABNORMAL HIGH (ref 1.5–6.5)
RBC: 5.04 10*6/uL (ref 4.20–5.70)

## 2012-09-19 MED ORDER — OCTREOTIDE ACETATE 30 MG IM KIT
30.0000 mg | PACK | Freq: Once | INTRAMUSCULAR | Status: AC
  Administered 2012-09-19: 30 mg via INTRAMUSCULAR

## 2012-09-19 NOTE — Progress Notes (Signed)
This office note has been dictated.

## 2012-09-20 ENCOUNTER — Other Ambulatory Visit: Payer: Self-pay | Admitting: *Deleted

## 2012-09-20 DIAGNOSIS — C7A8 Other malignant neuroendocrine tumors: Secondary | ICD-10-CM

## 2012-09-21 NOTE — Progress Notes (Signed)
CC:   Jt C. [name], M.D. Electron Vida Rigger, MD, Fax [phone]  DIAGNOSES: 1. 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  CURRENT THERAPY: 1. Status post 8 cycles of Temodar/Xeloda. 2. Sandostatin LAR 30 g IM monthly.  INTERIM HISTORY:  Mr. Downs comes in for follow-up.  He is doing better with his diarrhea.  He has had a problem with C difficile.  We finally treated him with Dificid.  This is a new antibiotic for Clostridium difficile.  This has helped him out quite a bit.  His stools are not nearly as loose.  He is having less abdominal spasms.  He is having some bladder issues because he has a Foley catheter.  I recommended some B and O suppositories.  He has these at home actually.  His appetite has been good.  Blood sugars have been a bit little on the higher side.  He has had no problems with fevers.  His last chromogranin A was down to 4.8.  PHYSICAL EXAMINATION:  General:  This is a thin, but well-nourished white gentleman in no obvious distress.  Vital Signs:  Temperature of 97.4, pulse 86, respiratory rate 18, blood pressure 94/65.  Head and Neck:  Normocephalic, atraumatic skull.  There are no ocular or oral lesions.  There are no palpable cervical or supraclavicular lymph nodes. Lungs:  Clear bilaterally.  Cardiac:  Regular rate and rhythm with a normal S1 and S2.  There are no murmurs, rubs, or bruits.  Abdomen: Soft with good bowel sounds.  There is no palpable abdominal mass. There is no fluid wave.  There is no palpable hepatosplenomegaly.  He has well-healed laparotomy scars.  Back:  No tenderness over the spine, ribs, or hips.  Extremities:  Some chronic trace edema in his lower legs.  Skin:  Some hyperpigmented lesions on his back.  LABORATORY STUDIES:  White cell count 10.2, hemoglobin 15.1, hematocrit 45.7, platelet count 234.  IMPRESSION:  Mr. Dowland is a 51 year old gentleman with metastatic neuroendocrine carcinoma.  He  is on Temodar and Xeloda.  He has done quite well on this.  We will go ahead and repeat another CT scan on him.  We will see about getting this set up in about 3 weeks.  I did make a call in to Radiology regarding the possibility of re- treatment of his liver with intrahepatic radiotherapy.  Mr. Becvar has had yttrium in the past.  This has helped quite a bit.  I think that if we find that he has continued progression of his liver lesions, that yttrium would be worthwhile.  I will see Mr. Fouts back in another month.  We will get his scans set up in about 3 weeks.    ______________________________ [name], M.D. PRE/MEDQ  D:  09/19/2012  T:  09/20/2012  Job:  [id number]

## 2012-09-26 LAB — LACTATE DEHYDROGENASE: LDH: 141 U/L (ref 94–250)

## 2012-09-26 LAB — COMPREHENSIVE METABOLIC PANEL
ALT: 19 U/L (ref 0–53)
CO2: 27 mEq/L (ref 19–32)
Calcium: 8.9 mg/dL (ref 8.4–10.5)
Chloride: 99 mEq/L (ref 96–112)
Potassium: 4 mEq/L (ref 3.5–5.3)
Sodium: 138 mEq/L (ref 135–145)
Total Protein: 6.8 g/dL (ref 6.0–8.3)

## 2012-09-29 ENCOUNTER — Other Ambulatory Visit: Payer: 59 | Admitting: Lab

## 2012-10-03 ENCOUNTER — Other Ambulatory Visit (HOSPITAL_COMMUNITY): Payer: Self-pay | Admitting: Interventional Radiology

## 2012-10-03 DIAGNOSIS — C787 Secondary malignant neoplasm of liver and intrahepatic bile duct: Secondary | ICD-10-CM

## 2012-10-03 DIAGNOSIS — C7A8 Other malignant neuroendocrine tumors: Secondary | ICD-10-CM

## 2012-10-04 ENCOUNTER — Other Ambulatory Visit: Payer: Self-pay | Admitting: Hematology & Oncology

## 2012-10-04 ENCOUNTER — Other Ambulatory Visit: Payer: 59

## 2012-10-12 ENCOUNTER — Other Ambulatory Visit: Payer: Self-pay | Admitting: Hematology & Oncology

## 2012-10-12 ENCOUNTER — Ambulatory Visit (HOSPITAL_COMMUNITY)
Admission: RE | Admit: 2012-10-12 | Discharge: 2012-10-12 | Disposition: A | Discharge status: Home / Self Care | Payer: 59 | Source: Ambulatory Visit | Attending: Hematology & Oncology | Admitting: Hematology & Oncology

## 2012-10-12 DIAGNOSIS — Z1289 Encounter for screening for malignant neoplasm of other sites: Secondary | ICD-10-CM | POA: Insufficient documentation

## 2012-10-12 DIAGNOSIS — C50919 Malignant neoplasm of unspecified site of unspecified female breast: Secondary | ICD-10-CM | POA: Diagnosis not present

## 2012-10-12 DIAGNOSIS — C7B8 Other secondary neuroendocrine tumors: Secondary | ICD-10-CM | POA: Insufficient documentation

## 2012-10-12 DIAGNOSIS — C7A8 Other malignant neuroendocrine tumors: Secondary | ICD-10-CM

## 2012-10-12 DIAGNOSIS — Q859 Phakomatosis, unspecified: Secondary | ICD-10-CM | POA: Insufficient documentation

## 2012-10-12 MED ORDER — IOHEXOL 300 MG/ML  SOLN
100.0000 mL | Freq: Once | INTRAMUSCULAR | Status: AC | PRN
  Administered 2012-10-12: 100 mL via INTRAVENOUS

## 2012-10-13 ENCOUNTER — Other Ambulatory Visit: Payer: Self-pay | Admitting: Hematology & Oncology

## 2012-10-17 ENCOUNTER — Other Ambulatory Visit (HOSPITAL_BASED_OUTPATIENT_CLINIC_OR_DEPARTMENT_OTHER): Payer: 59 | Admitting: Lab

## 2012-10-17 ENCOUNTER — Ambulatory Visit (HOSPITAL_BASED_OUTPATIENT_CLINIC_OR_DEPARTMENT_OTHER): Payer: 59 | Admitting: Hematology & Oncology

## 2012-10-17 ENCOUNTER — Ambulatory Visit (HOSPITAL_BASED_OUTPATIENT_CLINIC_OR_DEPARTMENT_OTHER): Payer: 59

## 2012-10-17 VITALS — BP 88/59 | HR 69 | Resp 18

## 2012-10-17 DIAGNOSIS — C7A00 Malignant carcinoid tumor of unspecified site: Secondary | ICD-10-CM

## 2012-10-17 DIAGNOSIS — C787 Secondary malignant neoplasm of liver and intrahepatic bile duct: Secondary | ICD-10-CM

## 2012-10-17 DIAGNOSIS — Q859 Phakomatosis, unspecified: Secondary | ICD-10-CM | POA: Diagnosis not present

## 2012-10-17 DIAGNOSIS — C7B8 Other secondary neuroendocrine tumors: Secondary | ICD-10-CM

## 2012-10-17 DIAGNOSIS — C7A8 Other malignant neuroendocrine tumors: Secondary | ICD-10-CM

## 2012-10-17 LAB — CBC WITH DIFFERENTIAL (CANCER CENTER ONLY)
BASO#: 0 10*3/uL (ref 0.0–0.2)
BASO%: 0.6 % (ref 0.0–2.0)
EOS%: 2.2 % (ref 0.0–7.0)
HCT: 41 % (ref 38.7–49.9)
HGB: 13.4 g/dL (ref 13.0–17.1)
LYMPH%: 22.1 % (ref 14.0–48.0)
MCH: 29.7 pg (ref 28.0–33.4)
MCHC: 32.7 g/dL (ref 32.0–35.9)
MONO%: 9.9 % (ref 0.0–13.0)
NEUT%: 65.2 % (ref 40.0–80.0)
RDW: 15.6 % (ref 11.1–15.7)

## 2012-10-17 MED ORDER — OCTREOTIDE ACETATE 30 MG IM KIT
30.0000 mg | PACK | Freq: Once | INTRAMUSCULAR | Status: AC
  Administered 2012-10-17: 30 mg via INTRAMUSCULAR

## 2012-10-17 NOTE — Progress Notes (Signed)
This office note has been dictated.

## 2012-10-18 NOTE — Progress Notes (Signed)
CC:   Jakorey C. [name], M.D. Eda Keys, MD, Fax [phone] Isaiah Serge, MD, Fax [phone]  DIAGNOSIS: 1. 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  CURRENT THERAPY: 1. The patient is status post 9 cycles of Temodar/Xeloda. 2. Sandostatin/LAR 30 g IM q. month.  INTERIM HISTORY:  Mr. Bouwens comes in for his followup.  His diarrhea has resolved nicely.  He was treated with Dificid.  After about 3 or 4 days, the diarrhea improved.  We did go ahead and do a CT scan of his chest, abdomen and pelvis.  This was done on the 7th of November.  The CT scan basically showed stable disease in his liver.  He has seen Interventional Radiology.  They will prepare him for another intrahepatic therapy with radioactive yttrium beads.  I think this will actually take place in December.  He has tolerated the chemotherapy quite nicely.  His blood sugars have not been too bad.  He and his wife are very aggressive with controlling the blood sugars.  His chromogranin A level does tend to fluctuate.  His last level was 11.2.  PHYSICAL EXAMINATION:  This is a well-developed, well-nourished white gentleman in no obvious distress.  Vital signs:  98.  Pulse is 74, respiratory rate 18, blood pressure 90/59.  Head and neck: Normocephalic and atraumatic skull.  There are no ocular or oral lesions.  There are no palpable cervical or supraclavicular lymph nodes.  Lungs:  Clear bilaterally.  Cardiac:  Regular rate and rhythm with a normal S1, S2. There are no murmurs, rubs or bruits.  Abdominal:  Soft.  He has well- healed laparotomy scars.  There is some slight distention.  His bowel sounds are very active.  He has no guarding or rebound tenderness. There is no palpable hepatosplenomegaly.  Extremities/Back:  No tenderness over the spine, ribs, or hips.  Extremities show no clubbing, cyanosis or edema.  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  LABORATORY STUDIES:  White cell  count is 5.1, hemoglobin 13.1, hematocrit 41, platelet count 256.  IMPRESSION:  Mr. Tiemann is a 51 year old gentleman with metastatic neuroendocrine carcinoma.  He has Von Hippel-Lindau syndrome.  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  I am glad that he is going to finally have his yttrium therapy.  He responded to this 2 years ago.  I think that he will respond again.  We will go ahead and plan to get him back in 1 month, as per usual.  He will continue the Temodar/Xeloda, as he is tolerating this well.  He did get a dose of Sandostatin today.    ______________________________ [name], M.D. PRE/MEDQ  D:  10/17/2012  T:  10/18/2012  Job:  [id number]

## 2012-10-20 ENCOUNTER — Other Ambulatory Visit: Payer: 59 | Admitting: Lab

## 2012-10-21 LAB — COMPREHENSIVE METABOLIC PANEL
AST: 28 U/L (ref 0–37)
Albumin: 3.9 g/dL (ref 3.5–5.2)
Alkaline Phosphatase: 207 U/L — ABNORMAL HIGH (ref 39–117)
BUN: 12 mg/dL (ref 6–23)
Creatinine, Ser: 0.69 mg/dL (ref 0.50–1.35)
Glucose, Bld: 83 mg/dL (ref 70–99)
Total Bilirubin: 0.4 mg/dL (ref 0.3–1.2)

## 2012-10-24 ENCOUNTER — Encounter (HOSPITAL_COMMUNITY): Payer: Self-pay

## 2012-10-26 ENCOUNTER — Other Ambulatory Visit: Payer: Self-pay | Admitting: Radiology

## 2012-10-31 ENCOUNTER — Other Ambulatory Visit (HOSPITAL_COMMUNITY): Payer: Self-pay | Admitting: Interventional Radiology

## 2012-10-31 ENCOUNTER — Ambulatory Visit (HOSPITAL_COMMUNITY)
Admission: RE | Admit: 2012-10-31 | Discharge: 2012-10-31 | Disposition: A | Discharge status: Home / Self Care | Payer: 59 | Source: Ambulatory Visit | Attending: Interventional Radiology | Admitting: Interventional Radiology

## 2012-10-31 ENCOUNTER — Encounter (HOSPITAL_COMMUNITY)
Admission: RE | Admit: 2012-10-31 | Discharge: 2012-10-31 | Disposition: A | Discharge status: Home / Self Care | Payer: 59 | Source: Ambulatory Visit | Attending: Interventional Radiology | Admitting: Interventional Radiology

## 2012-10-31 ENCOUNTER — Encounter (HOSPITAL_COMMUNITY): Payer: Self-pay

## 2012-10-31 DIAGNOSIS — C7A8 Other malignant neuroendocrine tumors: Secondary | ICD-10-CM

## 2012-10-31 DIAGNOSIS — D497 Neoplasm of unspecified behavior of endocrine glands and other parts of nervous system: Secondary | ICD-10-CM | POA: Diagnosis not present

## 2012-10-31 DIAGNOSIS — C787 Secondary malignant neoplasm of liver and intrahepatic bile duct: Secondary | ICD-10-CM

## 2012-10-31 DIAGNOSIS — C7A1 Malignant poorly differentiated neuroendocrine tumors: Secondary | ICD-10-CM | POA: Insufficient documentation

## 2012-10-31 HISTORY — DX: Other phakomatoses, not elsewhere classified: Q85.8

## 2012-10-31 HISTORY — DX: Von Hippel-Lindau syndrome: Q85.83

## 2012-10-31 HISTORY — DX: Other malignant neuroendocrine tumors: C7A.8

## 2012-10-31 HISTORY — DX: Other secondary neuroendocrine tumors: C7B.8

## 2012-10-31 LAB — BASIC METABOLIC PANEL
CO2: 30 mEq/L (ref 19–32)
Calcium: 9.2 mg/dL (ref 8.4–10.5)
Chloride: 97 mEq/L (ref 96–112)
Potassium: 3.9 mEq/L (ref 3.5–5.1)
Sodium: 137 mEq/L (ref 135–145)

## 2012-10-31 LAB — CBC WITH DIFFERENTIAL/PLATELET
Eosinophils Relative: 1 % (ref 0–5)
Hemoglobin: 15 g/dL (ref 13.0–17.0)
Lymphocytes Relative: 26 % (ref 12–46)
Lymphs Abs: 1.4 10*3/uL (ref 0.7–4.0)
MCV: 89.9 fL (ref 78.0–100.0)
Monocytes Relative: 9 % (ref 3–12)
Neutrophils Relative %: 64 % (ref 43–77)
Platelets: 264 10*3/uL (ref 150–400)
RBC: 5.05 MIL/uL (ref 4.22–5.81)
WBC: 5.6 10*3/uL (ref 4.0–10.5)

## 2012-10-31 LAB — PROTIME-INR
INR: 1.14 (ref 0.00–1.49)
Prothrombin Time: 14.4 seconds (ref 11.6–15.2)

## 2012-10-31 LAB — APTT: aPTT: 33 seconds (ref 24–37)

## 2012-10-31 MED ORDER — LIDOCAINE HCL 1 % IJ SOLN
INTRAMUSCULAR | Status: AC
  Filled 2012-10-31: qty 20

## 2012-10-31 MED ORDER — FENTANYL CITRATE 0.05 MG/ML IJ SOLN
INTRAMUSCULAR | Status: AC | PRN
  Administered 2012-10-31: 100 ug via INTRAVENOUS

## 2012-10-31 MED ORDER — MIDAZOLAM HCL 2 MG/2ML IJ SOLN
INTRAMUSCULAR | Status: AC | PRN
  Administered 2012-10-31: 2 mg via INTRAVENOUS

## 2012-10-31 MED ORDER — MIDAZOLAM HCL 2 MG/2ML IJ SOLN
INTRAMUSCULAR | Status: AC
  Filled 2012-10-31: qty 8

## 2012-10-31 MED ORDER — TECHNETIUM TO 99M ALBUMIN AGGREGATED
4.8000 | Freq: Once | INTRAVENOUS | Status: AC | PRN
  Administered 2012-10-31: 5 via INTRAVENOUS

## 2012-10-31 MED ORDER — HYDROCODONE-ACETAMINOPHEN 5-325 MG PO TABS: 1.0000 | ORAL_TABLET | ORAL | Status: DC | PRN

## 2012-10-31 MED ORDER — FENTANYL CITRATE 0.05 MG/ML IJ SOLN
INTRAMUSCULAR | Status: AC
  Filled 2012-10-31: qty 8

## 2012-10-31 MED ORDER — SODIUM CHLORIDE 0.9 % IV SOLN: INTRAVENOUS | Status: DC

## 2012-10-31 MED ORDER — IOHEXOL 300 MG/ML  SOLN: 60.0000 mL | Freq: Once | INTRAMUSCULAR | Status: DC | PRN

## 2012-10-31 NOTE — Progress Notes (Signed)
[name] is drinking without difficulty.   He has no complaints at this time.

## 2012-10-31 NOTE — Progress Notes (Signed)
Pt discharged to home with wife.

## 2012-10-31 NOTE — Procedures (Signed)
Hepatic selective arteriogram, MAA injection No complication No blood loss. See complete dictation in [facility].

## 2012-10-31 NOTE — Progress Notes (Signed)
Pt returned from IR s/p pre-Y90 arteriogram.  Pt Alert and oriented.  Left groin noted to have bandaid and clean dry and intact.  Left groin soft with no hematoma, no bleeding noted.  Pt is paralyzed from mid torso down.  Pt has no complaints.

## 2012-10-31 NOTE — H&P (Signed)
Chief Complaint: "I'm here for pre Y-90 treatment Referring Physician:Ennever HPI: [name]. is an 51 y.o. 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 He has had previous Y-90 treatment in 2011 and now has evidence of recurrent disease to the liver. He is set up for repeat pre Y-90 today and anticipated Y-90 treatment in a couple weeks. He feels normal otherwise, no new c/o or illnesses. PMHX and meds reviewed  Past Medical History:  Past Medical History  Diagnosis Date  . Von Hippel-Lindau syndrome   . 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  Family History: No family history on file.  Social History:  reports that he quit smoking about 4 years ago. He does not have any smokeless tobacco history on file. He reports that he drinks alcohol. He reports that he does not use illicit drugs.  Allergies:  Allergies  Allergen Reactions  . Ciprocin-Fluocin-Procin (Fluocinolone Acetonide) Rash    Pt states this happened with IV Cipro. ABLE TO TAKE PO CIPRO.    Medications: Amylase-Lipase-Protease (CREON 10 PO) (Taking) Sig - Route: Take 3 tablets by mouth 3 (three) times daily with meals. - Oral Class: Historical Med Number of times this order has been changed since signing: 2 [address] baclofen (LIORESAL) 20 MG tablet (Taking) Sig - Route: Take 20 mg by mouth 6 (six) times daily. Every 3 Hours. - Oral Class: Historical Med Number of times this order has been changed since signing: 3 [address] diazepam (VALIUM) 5 MG tablet (Taking) 40 tablet 1 10/04/2012 Sig: TAKE 1 TABLET BY MOUTH 3 TIMES A DAY AS NEEDED FOR MUSCLE SPASMS Class: Print Number of times this order has been changed since signing: 1 [address] HYDROcodone-acetaminophen (VICODIN) 5-500 MG per tablet (Taking) Sig - Route: Take 1 tablet by mouth every 6 (six) hours as needed. - Oral Class: Historical Med Number of times  this order has been changed since signing: 1 [address] insulin glargine (LANTUS) 100 UNIT/ML injection (Taking) Sig - Route: Inject 30 Units into the skin daily before breakfast. - Subcutaneous Class: Historical Med Number of times this order has been changed since signing: 4 Order [address] insulin lispro (HUMALOG) 100 UNIT/ML injection (Taking) Sig - Route: Inject into the skin 3 (three) times daily before meals. 1 unit for each 15g of carbohydrates TID with meals - Subcutaneous Class: Historical Med Number of times this order has been changed since signing: 3 Order [address] midodrine (PROAMATINE) 5 MG tablet (Taking) Sig - Route: Take 5 mg by mouth every 6 (six) hours as needed. - Oral Class: Historical Med Number of times this order has been changed since signing: 1 Order [address] mirtazapine (REMERON) 15 MG tablet (Taking) 30 tablet 3 10/13/2012 Sig: TAKE 1 TABLET BY MOUTH AT BEDTIME. Number of times this order has been changed since signing: 1 Order [address] Multiple Vitamin (MULTIVITAMIN) capsule (Taking) Sig - Route: Take 1 capsule by mouth daily. Without folic acid. - Oral Class: Historical Med Number of times this order has been changed since signing: 1 Order [address] omeprazole (PRILOSEC) 20 MG capsule (Taking) Sig - Route: Take 20 mg by mouth daily. - Oral Class: Historical Med Number of times this order has been changed since signing: 1 Order [address] promethazine (PHENERGAN) 12.5 MG tablet (Taking) Sig - Route: Take 12.5 mg by mouth every 6 (six) hours as  needed. - Oral Class: Historical Med Number of times this order has been changed since signing: 1 Order [address] pyridOXINE (VITAMIN B-6) 100 MG tablet (Taking) Sig - Route: Take 100 mg by mouth 3 (three) times daily. - Oral Class: Historical Med Number of times this order has been changed since signing: 1 Order [address] temozolomide (TEMODAR) 140 MG capsule (Taking) 8 capsule 4 10/13/2012 Sig: TAKE 2 CAPSULES A DAY FOR 4  DAYS. START ON DAY 10 OF EACH 28 DAY CYCLE. Number of times this order has been changed since signing: 1 Order [address] tiZANidine (ZANAFLEX) 4 MG capsule (Taking) 60 capsule 5 01/10/2012 Sig: 2 tabs every 6 hours as needed for spasms Number of times this order has been changed since signing: 1 Order [address] traMADol (ULTRAM) 50 MG tablet (Taking) 90 tablet 1 04/27/2012 Sig: TAKE 1 TABLET BY MOUTH 3 TIMES A DAY Number of times this order has been changed since signing: 1 Order [address] XELODA 500 MG tablet (Taking) 56 tablet 4 10/13/2012 Sig: TAKE 2 TABLETS TWICE A DAY WITH FOOD FOR 14 DAYS ON AND 14 DAYS OFF. Number of times this order has been    Please HPI for pertinent positives, otherwise complete 10 system ROS negative.  Physical Exam: There were no vitals taken for this visit. There is no height or weight on file to calculate BMI.   General Appearance:  Alert, cooperative, no distress, appears stated age  Head:  Normocephalic, without obvious abnormality, atraumatic  ENT: Unremarkable  Neck: Supple, symmetrical, trachea midline, no adenopathy, thyroid: not enlarged, symmetric, no tenderness/mass/nodules  Lungs:   Clear to auscultation bilaterally, no w/r/r, respirations unlabored without use of accessory muscles.  Heart:  Regular rate and rhythm, S1, S2 normal, no murmur, rub or gallop. Carotids 2+ without bruit.  Abdomen:   Soft, non-tender, non distended. Bowel sounds active all four quadrants,  no masses, no organomegaly.  Extremities: Extremities normal, 1+edema  Pulses: Palp femoral, diminished pedal secondary to edema  Neurologic: Normal affect, no gross deficits.   Results for orders placed during the hospital encounter of 10/31/12 (from the past 48 hour(s))  CBC WITH DIFFERENTIAL     Status: Abnormal   Collection Time   10/31/12  9:00 AM      Component Value Range Comment   WBC 5.6  4.0 - 10.5 K/uL    RBC 5.05  4.22 - 5.81 MIL/uL    Hemoglobin 15.0  13.0 - 17.0 g/dL      HCT 95.2  84.1 - 32.4 %    MCV 89.9  78.0 - 100.0 fL    MCH 29.7  26.0 - 34.0 pg    MCHC 33.0  30.0 - 36.0 g/dL    RDW 40.1 (*) 02.7 - 15.5 %    Platelets 264  150 - 400 K/uL    Neutrophils Relative 64  43 - 77 %    Neutro Abs 3.6  1.7 - 7.7 K/uL    Lymphocytes Relative 26  12 - 46 %    Lymphs Abs 1.4  0.7 - 4.0 K/uL    Monocytes Relative 9  3 - 12 %    Monocytes Absolute 0.5  0.1 - 1.0 K/uL    Eosinophils Relative 1  0 - 5 %    Eosinophils Absolute 0.0  0.0 - 0.7 K/uL    Basophils Relative 1  0 - 1 %    Basophils Absolute 0.0  0.0 - 0.1 K/uL  No results found.  Assessment/Plan Neuroendocrine tumor with mets to liver For Pre Y-90 angiogram and possible embolization today Reviewed procedure with pt and wife. Labs pending Consent signed in chart  Brayton El PA-C 10/31/2012, 9:29 AM   \

## 2012-11-07 DIAGNOSIS — E109 Type 1 diabetes mellitus without complications: Secondary | ICD-10-CM | POA: Diagnosis not present

## 2012-11-09 ENCOUNTER — Ambulatory Visit: Payer: 59

## 2012-11-09 ENCOUNTER — Other Ambulatory Visit: Payer: 59 | Admitting: Lab

## 2012-11-09 ENCOUNTER — Ambulatory Visit: Payer: 59 | Admitting: Hematology & Oncology

## 2012-11-13 ENCOUNTER — Other Ambulatory Visit: Payer: Self-pay | Admitting: Radiology

## 2012-11-14 ENCOUNTER — Other Ambulatory Visit: Payer: 59 | Admitting: Lab

## 2012-11-14 ENCOUNTER — Other Ambulatory Visit: Payer: Self-pay | Admitting: Radiology

## 2012-11-14 ENCOUNTER — Ambulatory Visit: Payer: 59 | Admitting: Hematology & Oncology

## 2012-11-15 ENCOUNTER — Ambulatory Visit (HOSPITAL_COMMUNITY)
Admission: RE | Admit: 2012-11-15 | Discharge: 2012-11-15 | Disposition: A | Discharge status: Home / Self Care | Payer: 59 | Source: Ambulatory Visit | Attending: Interventional Radiology | Admitting: Interventional Radiology

## 2012-11-15 ENCOUNTER — Other Ambulatory Visit (HOSPITAL_COMMUNITY): Payer: Self-pay | Admitting: Interventional Radiology

## 2012-11-15 ENCOUNTER — Encounter (HOSPITAL_COMMUNITY): Payer: Self-pay

## 2012-11-15 ENCOUNTER — Encounter (HOSPITAL_COMMUNITY)
Admission: RE | Admit: 2012-11-15 | Discharge: 2012-11-15 | Disposition: A | Discharge status: Home / Self Care | Payer: 59 | Source: Ambulatory Visit | Attending: Interventional Radiology | Admitting: Interventional Radiology

## 2012-11-15 DIAGNOSIS — C7A8 Other malignant neuroendocrine tumors: Secondary | ICD-10-CM

## 2012-11-15 DIAGNOSIS — C787 Secondary malignant neoplasm of liver and intrahepatic bile duct: Secondary | ICD-10-CM

## 2012-11-15 DIAGNOSIS — C7A1 Malignant poorly differentiated neuroendocrine tumors: Secondary | ICD-10-CM | POA: Diagnosis not present

## 2012-11-15 DIAGNOSIS — C7A00 Malignant carcinoid tumor of unspecified site: Secondary | ICD-10-CM | POA: Insufficient documentation

## 2012-11-15 LAB — APTT: aPTT: 33 seconds (ref 24–37)

## 2012-11-15 LAB — COMPREHENSIVE METABOLIC PANEL
AST: 37 U/L (ref 0–37)
CO2: 28 mEq/L (ref 19–32)
Calcium: 8.9 mg/dL (ref 8.4–10.5)
Chloride: 101 mEq/L (ref 96–112)
Creatinine, Ser: 0.71 mg/dL (ref 0.50–1.35)
GFR calc Af Amer: >90 mL/min (ref >90)
GFR calc non Af Amer: >90 mL/min (ref >90)
Glucose, Bld: 224 mg/dL — ABNORMAL HIGH (ref 70–99)
Total Bilirubin: 0.4 mg/dL (ref 0.3–1.2)

## 2012-11-15 LAB — CBC WITH DIFFERENTIAL/PLATELET
Basophils Absolute: 0 10*3/uL (ref 0.0–0.1)
Basophils Relative: 1 % (ref 0–1)
Eosinophils Absolute: 0.1 10*3/uL (ref 0.0–0.7)
Eosinophils Relative: 3 % (ref 0–5)
HCT: 42.7 % (ref 39.0–52.0)
Hemoglobin: 14 g/dL (ref 13.0–17.0)
MCH: 29.2 pg (ref 26.0–34.0)
MCHC: 32.8 g/dL (ref 30.0–36.0)
MCV: 89 fL (ref 78.0–100.0)
Monocytes Absolute: 0.6 10*3/uL (ref 0.1–1.0)
Monocytes Relative: 12 % (ref 3–12)
RDW: 15.7 % — ABNORMAL HIGH (ref 11.5–15.5)

## 2012-11-15 MED ORDER — MIDAZOLAM HCL 2 MG/2ML IJ SOLN
INTRAMUSCULAR | Status: AC
  Filled 2012-11-15: qty 8

## 2012-11-15 MED ORDER — PANTOPRAZOLE SODIUM 40 MG IV SOLR
INTRAVENOUS | Status: AC
  Filled 2012-11-15: qty 40

## 2012-11-15 MED ORDER — DEXAMETHASONE SODIUM PHOSPHATE 10 MG/ML IJ SOLN
INTRAMUSCULAR | Status: AC
  Filled 2012-11-15: qty 2

## 2012-11-15 MED ORDER — OCTREOTIDE ACETATE 100 MCG/ML IJ SOLN
200.0000 ug | Freq: Once | INTRAMUSCULAR | Status: AC
  Administered 2012-11-15: 200 ug via INTRAVENOUS
  Filled 2012-11-15: qty 2

## 2012-11-15 MED ORDER — KETOROLAC TROMETHAMINE 30 MG/ML IJ SOLN
30.0000 mg | Freq: Once | INTRAMUSCULAR | Status: AC
  Administered 2012-11-15: 30 mg via INTRAVENOUS

## 2012-11-15 MED ORDER — FENTANYL CITRATE 0.05 MG/ML IJ SOLN
INTRAMUSCULAR | Status: AC
  Filled 2012-11-15: qty 8

## 2012-11-15 MED ORDER — SODIUM CHLORIDE 0.9 % IV SOLN: INTRAVENOUS | Status: DC

## 2012-11-15 MED ORDER — DEXAMETHASONE SODIUM PHOSPHATE 10 MG/ML IJ SOLN
20.0000 mg | Freq: Once | INTRAMUSCULAR | Status: AC
  Administered 2012-11-15: 20 mg via INTRAVENOUS

## 2012-11-15 MED ORDER — YTTRIUM 90 INJECTION: 36.1000 | INJECTION | Freq: Once | INTRAVENOUS | Status: DC

## 2012-11-15 MED ORDER — ONDANSETRON HCL 4 MG/2ML IJ SOLN
4.0000 mg | Freq: Once | INTRAMUSCULAR | Status: AC
  Administered 2012-11-15: 4 mg via INTRAVENOUS

## 2012-11-15 MED ORDER — PANTOPRAZOLE SODIUM 40 MG IV SOLR
40.0000 mg | Freq: Once | INTRAVENOUS | Status: AC
  Administered 2012-11-15: 40 mg via INTRAVENOUS

## 2012-11-15 MED ORDER — MIDAZOLAM HCL 2 MG/2ML IJ SOLN
INTRAMUSCULAR | Status: AC | PRN
  Administered 2012-11-15 (×3): 2 mg via INTRAVENOUS

## 2012-11-15 MED ORDER — FENTANYL CITRATE 0.05 MG/ML IJ SOLN
INTRAMUSCULAR | Status: AC | PRN
  Administered 2012-11-15 (×2): 100 ug via INTRAVENOUS

## 2012-11-15 MED ORDER — ONDANSETRON HCL 4 MG/2ML IJ SOLN
INTRAMUSCULAR | Status: AC
  Filled 2012-11-15: qty 2

## 2012-11-15 MED ORDER — LIDOCAINE HCL 1 % IJ SOLN
INTRAMUSCULAR | Status: AC
  Filled 2012-11-15: qty 20

## 2012-11-15 MED ORDER — PIPERACILLIN-TAZOBACTAM 3.375 G IVPB
3.3750 g | Freq: Once | INTRAVENOUS | Status: AC
  Administered 2012-11-15: 3.375 g via INTRAVENOUS
  Filled 2012-11-15: qty 50

## 2012-11-15 MED ORDER — KETOROLAC TROMETHAMINE 30 MG/ML IJ SOLN
INTRAMUSCULAR | Status: AC
  Filled 2012-11-15: qty 1

## 2012-11-15 NOTE — H&P (Signed)
Chief Complaint: "I'm here for Y-90 treatment Referring Physician:Ennever HPI: [name]. is an 51 y.o. 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 He has had previous Y-90 treatment in 2011 and now has evidence of recurrent disease to the liver. He was set up and underwent repeat pre Y-90 on 11/26 and is now schedule for Y-90 treatment today. He feels normal otherwise, no new c/o or illnesses. PMHX and meds reviewed, no changes  Past Medical History:  Past Medical History  Diagnosis Date  . Von Hippel-Lindau syndrome   . 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  Social History:  reports that he quit smoking about 4 years ago. He does not have any smokeless tobacco history on file. He reports that he drinks alcohol. He reports that he does not use illicit drugs.  Allergies:  Allergies  Allergen Reactions  . Ciprocin-Fluocin-Procin (Fluocinolone Acetonide) Rash    Pt states this happened with IV Cipro. ABLE TO TAKE PO CIPRO.    Medications: Amylase-Lipase-Protease (CREON 10 PO) (Taking) Sig - Route: Take 3 tablets by mouth 3 (three) times daily with meals. - Oral Class: Historical Med Number of times this order has been changed since signing: 2 [address] baclofen (LIORESAL) 20 MG tablet (Taking) Sig - Route: Take 20 mg by mouth 6 (six) times daily. Every 3 Hours. - Oral Class: Historical Med Number of times this order has been changed since signing: 3 [address] diazepam (VALIUM) 5 MG tablet (Taking) 40 tablet 1 10/04/2012 Sig: TAKE 1 TABLET BY MOUTH 3 TIMES A DAY AS NEEDED FOR MUSCLE SPASMS Class: Print Number of times this order has been changed since signing: 1 [address] HYDROcodone-acetaminophen (VICODIN) 5-500 MG per tablet (Taking) Sig - Route: Take 1 tablet by mouth every 6 (six) hours as needed. - Oral Class: Historical Med Number of times this  order has been changed since signing: 1 [address] insulin glargine (LANTUS) 100 UNIT/ML injection (Taking) Sig - Route: Inject 30 Units into the skin daily before breakfast. - Subcutaneous Class: Historical Med Number of times this order has been changed since signing: 4 Order [address] insulin lispro (HUMALOG) 100 UNIT/ML injection (Taking) Sig - Route: Inject into the skin 3 (three) times daily before meals. 1 unit for each 15g of carbohydrates TID with meals - Subcutaneous Class: Historical Med Number of times this order has been changed since signing: 3 Order [address] midodrine (PROAMATINE) 5 MG tablet (Taking) Sig - Route: Take 5 mg by mouth every 6 (six) hours as needed. - Oral Class: Historical Med Number of times this order has been changed since signing: 1 [address] mirtazapine (REMERON) 15 MG tablet (Taking) 30 tablet 3 10/13/2012 Sig: TAKE 1 TABLET BY MOUTH AT BEDTIME. Number of times this order has been changed since signing: 1 Order [address] Multiple Vitamin (MULTIVITAMIN) capsule (Taking) Sig - Route: Take 1 capsule by mouth daily. Without folic acid. - Oral Class: Historical Med Number of times this order has been changed since signing: 1 Order [address] omeprazole (PRILOSEC) 20 MG capsule (Taking) Sig - Route: Take 20 mg by mouth daily. - Oral Class: Historical Med Number of times this order has been changed since signing: 1 Order [address] promethazine (PHENERGAN) 12.5 MG tablet (Taking) Sig - Route: Take 12.5 mg by mouth every 6 (six) hours as needed. -  Oral Class: Historical Med Number of times this order has been changed since signing: 1 Order [address] pyridOXINE (VITAMIN B-6) 100 MG tablet (Taking) Sig - Route: Take 100 mg by mouth 3 (three) times daily. - Oral Class: Historical Med Number of times this order has been changed since signing: 1 Order [address] temozolomide (TEMODAR) 140 MG capsule (Taking) 8 capsule 4 10/13/2012 Sig: TAKE 2 CAPSULES A DAY FOR 4 DAYS.  START ON DAY 10 OF EACH 28 DAY CYCLE. Number of times this order has been changed since signing: 1 Order [address] tiZANidine (ZANAFLEX) 4 MG capsule (Taking) 60 capsule 5 01/10/2012 Sig: 2 tabs every 6 hours as needed for spasms Number of times this order has been changed since signing: 1 Order [address] traMADol (ULTRAM) 50 MG tablet (Taking) 90 tablet 1 04/27/2012 Sig: TAKE 1 TABLET BY MOUTH 3 TIMES A DAY Number of times this order has been changed since signing: 1 Order [address] XELODA 500 MG tablet (Taking) 56 tablet 4 10/13/2012 Sig: TAKE 2 TABLETS TWICE A DAY WITH FOOD FOR 14 DAYS ON AND 14 DAYS OFF. Number of times this order has been    Please HPI for pertinent positives, otherwise complete 10 system ROS negative.  Physical Exam: Blood pressure 129/85, pulse 70, temperature 97.7 F (36.5 C), resp. rate 20, SpO2 95.00%. There is no height or weight on file to calculate BMI.   General Appearance:  Alert, cooperative, no distress, appears stated age  Head:  Normocephalic, without obvious abnormality, atraumatic  ENT: Unremarkable  Neck: Supple, symmetrical, trachea midline, no adenopathy, thyroid: not enlarged, symmetric, no tenderness/mass/nodules  Lungs:   Clear to auscultation bilaterally, no w/r/r, respirations unlabored without use of accessory muscles.  Heart:  Regular rate and rhythm, S1, S2 normal, no murmur, rub or gallop. Carotids 2+ without bruit.  Abdomen:   Soft, non-tender, non distended. Bowel sounds active all four quadrants,  no masses, no organomegaly.  Extremities: Extremities normal, 1+edema  Pulses: Palp femoral, diminished pedal secondary to edema  Neurologic: Normal affect, no gross deficits.      Assessment/Plan Neuroendocrine tumor with mets to liver For Y-90 angiogram and treatment Reviewed procedure with pt and wife. Labs pending Consent signed in chart  Brayton El PA-C 11/15/2012, 9:15 AM

## 2012-11-15 NOTE — Procedures (Signed)
Y90 R hepatic art radioembo No complication No blood loss. See complete dictation in [facility].

## 2012-11-16 ENCOUNTER — Other Ambulatory Visit: Payer: 59 | Admitting: Lab

## 2012-11-16 ENCOUNTER — Ambulatory Visit: Payer: 59 | Admitting: Hematology & Oncology

## 2012-11-17 ENCOUNTER — Other Ambulatory Visit: Payer: Self-pay | Admitting: *Deleted

## 2012-11-17 DIAGNOSIS — R066 Hiccough: Secondary | ICD-10-CM

## 2012-11-17 MED ORDER — CHLORPROMAZINE HCL 10 MG PO TABS: 10.0000 mg | ORAL_TABLET | Freq: Four times a day (QID) | ORAL | Status: DC | PRN

## 2012-11-20 ENCOUNTER — Other Ambulatory Visit: Payer: 59 | Admitting: Lab

## 2012-11-20 ENCOUNTER — Ambulatory Visit (HOSPITAL_BASED_OUTPATIENT_CLINIC_OR_DEPARTMENT_OTHER): Payer: 59

## 2012-11-20 ENCOUNTER — Other Ambulatory Visit: Payer: Self-pay | Admitting: Hematology & Oncology

## 2012-11-20 ENCOUNTER — Ambulatory Visit (HOSPITAL_BASED_OUTPATIENT_CLINIC_OR_DEPARTMENT_OTHER): Payer: 59 | Admitting: Hematology & Oncology

## 2012-11-20 VITALS — BP 82/59 | HR 87 | Temp 95.0°F | Resp 18

## 2012-11-20 DIAGNOSIS — C7A8 Other malignant neuroendocrine tumors: Secondary | ICD-10-CM

## 2012-11-20 DIAGNOSIS — Q859 Phakomatosis, unspecified: Secondary | ICD-10-CM

## 2012-11-20 DIAGNOSIS — C7B8 Other secondary neuroendocrine tumors: Secondary | ICD-10-CM

## 2012-11-20 DIAGNOSIS — C7A00 Malignant carcinoid tumor of unspecified site: Secondary | ICD-10-CM

## 2012-11-20 LAB — CBC WITH DIFFERENTIAL (CANCER CENTER ONLY)
EOS%: 1.7 % (ref 0.0–7.0)
HGB: 13.6 g/dL (ref 13.0–17.1)
LYMPH#: 0.7 10*3/uL — ABNORMAL LOW (ref 0.9–3.3)
MCH: 28.9 pg (ref 28.0–33.4)
MCHC: 32.3 g/dL (ref 32.0–35.9)
MONO%: 10.1 % (ref 0.0–13.0)
NEUT#: 3.5 10*3/uL (ref 1.5–6.5)
Platelets: 213 10*3/uL (ref 145–400)
RBC: 4.7 10*6/uL (ref 4.20–5.70)

## 2012-11-20 MED ORDER — OCTREOTIDE ACETATE 30 MG IM KIT
30.0000 mg | PACK | Freq: Once | INTRAMUSCULAR | Status: AC
  Administered 2012-11-20: 30 mg via INTRAMUSCULAR

## 2012-11-20 NOTE — Progress Notes (Signed)
This office note has been dictated.

## 2012-11-20 NOTE — Patient Instructions (Signed)
Octreotide injection solution  What is this medicine?  OCTREOTIDE (ok TREE oh tide) is used to reduce blood levels of growth hormone in patients with a condition called acromegaly. This medicine also reduces flushing and watery diarrhea caused by certain types of cancer.  This medicine may be used for other purposes; ask your health care provider or pharmacist if you have questions.  What should I tell my health care provider before I take this medicine?  They need to know if you have any of these conditions: -gallbladder disease -kidney disease -liver disease -an unusual or allergic reaction to octreotide, other medicines, foods, dyes, or preservatives -pregnant or trying to get pregnant -breast-feeding  How should I use this medicine?  This medicine is for injection under the skin or into a vein (only in emergency situations). It is usually given by a health care professional in a clinical setting.     What if I miss a dose?  If you miss a dose, take it as soon as you can. If it is almost time for your next dose, take only that dose. Do not take double or extra doses.  What may interact with this medicine?  Do not take this medicine with any of the following medications: -cisapride -droperidol -general anesthetics -grepafloxacin -perphenazine -thioridazine This medicine may also interact with the following medications: -bromocriptine -cyclosporine -diuretics -medicines for blood pressure, heart disease, irregular heart beat -medicines for diabetes, including insulin -quinidine  This list may not describe all possible interactions. Give your health care provider a list of all the medicines, herbs, non-prescription drugs, or dietary supplements you use. Also tell them if you smoke, drink alcohol, or use illegal drugs. Some items may interact with your medicine.  What should I watch for while using this medicine?  Visit your doctor or health care professional for  regular checks on your progress.  To help reduce irritation at the injection site, use a different site for each injection and make sure the solution is at room temperature before use. This medicine may cause increases or decreases in blood sugar. Signs of high blood sugar include frequent urination, unusual thirst, flushed or dry skin, difficulty breathing, drowsiness, stomach ache, nausea, vomiting or dry mouth. Signs of low blood sugar include chills, cool, pale skin or cold sweats, drowsiness, extreme hunger, fast heartbeat, headache, nausea, nervousness or anxiety, shakiness, trembling, unsteadiness, tiredness, or weakness. Contact your doctor or health care professional right away if you experience any of these symptoms.  What side effects may I notice from receiving this medicine? Side effects that you should report to your doctor or health care professional as soon as possible: -allergic reactions like skin rash, itching or hives, swelling of the face, lips, or tongue -changes in blood sugar -changes in heart rate -severe stomach pain Side effects that usually do not require medical attention (report to your doctor or health care professional if they continue or are bothersome): -diarrhea or constipation -gas or stomach pain -nausea, vomiting -pain, redness, swelling and irritation at site where injected  This list may not describe all possible side effects. Call your doctor for medical advice about side effects. You may report side effects to FDA at 1-800-FDA-1088.

## 2012-11-21 DIAGNOSIS — Q859 Phakomatosis, unspecified: Secondary | ICD-10-CM | POA: Diagnosis not present

## 2012-11-21 DIAGNOSIS — D1809 Hemangioma of other sites: Secondary | ICD-10-CM | POA: Diagnosis not present

## 2012-11-21 DIAGNOSIS — H35379 Puckering of macula, unspecified eye: Secondary | ICD-10-CM | POA: Diagnosis not present

## 2012-11-21 NOTE — Progress Notes (Signed)
CC:   [name], M.D. Electron Vida Rigger, MD, Fax [phone]  DIAGNOSES: 1. 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  CURRENT THERAPY: 1. Patient status post intrahepatic therapy with yttrium-90. 2. Patient status post 10 cycles of Temodar-Xeloda. 3. Sandostatin LAR 30 mg IM monthly.  INTERIM HISTORY:  [name] comes in for his followup.  He did have his intrahepatic therapy with yttrium.  He did okay with this.  This was done on the 11th of December.  He did have some "bloating" afterwards.  This seems to be getting a little bit better.  He did have hiccups.  This is not unusual after intrahepatic therapy.  We put him on some Thorazine.  The Thorazine actually did help him quite a bit.  The Thorazine also helped with some nausea.  He has had no cough or shortness breath.  He has had no problems with diarrhea.  He did have C difficile in the past, but this seems to be better.  His last chromogranin A level was 4.4 back in November.  He has had no fevers.  He has had no bleeding.  PHYSICAL EXAMINATION:  General:  This is a well-developed, well- nourished white gentleman.  He is in a wheelchair that is motorized. Vital signs:  Temperature of 95, pulse 87, respiratory rate 18, blood pressure 82/59.  Head and neck:  Normocephalic, atraumatic skull.  There are no ocular or oral lesions.  There are no palpable cervical or supraclavicular lymph nodes.  Lungs:  Clear bilaterally.  Cardiac: Regular rate and rhythm with a normal S1 and S2.  He has a 1/6 systolic ejection murmur.  Abdomen:  Soft with good bowel sounds.  He has well- healed laparotomy scars.  He has no fluid wave.  There is no palpable hepatosplenomegaly.  Extremities:  No clubbing, cyanosis, or edema. Skin:  No rashes.  Neurological:  Paraplegia from the waist down.  LABORATORY STUDIES:  White cell count of 5.1, hematocrit 14, hematocrit 42.7, platelet count 259.  IMPRESSION:  [name] is a 51 year old gentleman with metastatic neuroendocrine carcinomas.  This is all part of the Von Hippel-Lindau syndrome.  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  He has been on Xeloda/Temodar.  He has done really well with this.  He recently received intrahepatic therapy.  I think we can hold on the Temodar/Xeloda for right now.  I believe that we can just follow him off treatment.  He will get his Sandostatin today.  I will go ahead and plan for a followup CT scan in about 6 weeks.  This will give [place] a decent idea as to how well the intrahepatic therapy went.  I will see [name] back after he has his CT scan done at the end of January.    ______________________________ [name], M.D. PRE/MEDQ  D:  11/20/2012  T:  11/20/2012  Job:  [id number]

## 2012-11-23 ENCOUNTER — Telehealth: Payer: Self-pay | Admitting: *Deleted

## 2012-11-23 LAB — COMPREHENSIVE METABOLIC PANEL
ALT: 24 U/L (ref 0–53)
AST: 31 U/L (ref 0–37)
Albumin: 3.7 g/dL (ref 3.5–5.2)
Alkaline Phosphatase: 223 U/L — ABNORMAL HIGH (ref 39–117)
Calcium: 8.7 mg/dL (ref 8.4–10.5)
Chloride: 97 mEq/L (ref 96–112)
Potassium: 3.9 mEq/L (ref 3.5–5.3)
Sodium: 134 mEq/L — ABNORMAL LOW (ref 135–145)
Total Protein: 6.4 g/dL (ref 6.0–8.3)

## 2012-11-23 NOTE — Telephone Encounter (Signed)
Called patient to let him know that his labs were all ok but sugar is pretty high .leftm message on patients personal cell phone

## 2012-11-23 NOTE — Telephone Encounter (Signed)
Message copied by Anselm Jungling on Thu Nov 23, 2012 12:58 PM ------      Message from: [name]      Created: Tue Nov 21, 2012  9:12 PM       Call - labs are ok but sugar is pretty high!! Cindee Lame

## 2012-11-24 ENCOUNTER — Telehealth: Payer: Self-pay | Admitting: Oncology

## 2012-11-24 NOTE — Telephone Encounter (Addendum)
Message copied by Lacie Draft on Fri Nov 24, 2012  4:24 PM ------      Message from: [name]      Created: Fri Nov 24, 2012  7:23 AM       Call - labs look great - blood sugar is high!!  [employer] with patient, states med had not "Kicked in" due to early blood draw. Teola Bradley, Cerita Rabelo [employer]

## 2012-12-11 ENCOUNTER — Other Ambulatory Visit: Payer: Self-pay | Admitting: *Deleted

## 2012-12-11 DIAGNOSIS — C7A8 Other malignant neuroendocrine tumors: Secondary | ICD-10-CM

## 2012-12-11 DIAGNOSIS — E119 Type 2 diabetes mellitus without complications: Secondary | ICD-10-CM

## 2012-12-11 DIAGNOSIS — B3749 Other urogenital candidiasis: Secondary | ICD-10-CM

## 2012-12-11 MED ORDER — FLUCONAZOLE 100 MG PO TABS: 100.0000 mg | ORAL_TABLET | Freq: Every day | ORAL | Status: DC

## 2012-12-11 NOTE — Telephone Encounter (Signed)
Reviewed pt's observation of "yeast deposits in urine. To start Diflucan 100 mg PO daily x 10 days. Made him aware of the possible sedating effects of Valium + Diflucan. He verbalized understanding.

## 2013-01-01 ENCOUNTER — Encounter (HOSPITAL_BASED_OUTPATIENT_CLINIC_OR_DEPARTMENT_OTHER): Payer: Self-pay

## 2013-01-01 ENCOUNTER — Other Ambulatory Visit (HOSPITAL_BASED_OUTPATIENT_CLINIC_OR_DEPARTMENT_OTHER): Payer: 59 | Admitting: Lab

## 2013-01-01 ENCOUNTER — Ambulatory Visit (HOSPITAL_BASED_OUTPATIENT_CLINIC_OR_DEPARTMENT_OTHER)
Admission: RE | Admit: 2013-01-01 | Discharge: 2013-01-01 | Disposition: A | Discharge status: Home / Self Care | Payer: 59 | Source: Ambulatory Visit | Attending: Hematology & Oncology | Admitting: Hematology & Oncology

## 2013-01-01 DIAGNOSIS — C7A00 Malignant carcinoid tumor of unspecified site: Secondary | ICD-10-CM

## 2013-01-01 DIAGNOSIS — N281 Cyst of kidney, acquired: Secondary | ICD-10-CM | POA: Diagnosis not present

## 2013-01-01 DIAGNOSIS — C787 Secondary malignant neoplasm of liver and intrahepatic bile duct: Secondary | ICD-10-CM | POA: Diagnosis not present

## 2013-01-01 DIAGNOSIS — C7A8 Other malignant neuroendocrine tumors: Secondary | ICD-10-CM

## 2013-01-01 DIAGNOSIS — C759 Malignant neoplasm of endocrine gland, unspecified: Secondary | ICD-10-CM | POA: Diagnosis not present

## 2013-01-01 DIAGNOSIS — Z85528 Personal history of other malignant neoplasm of kidney: Secondary | ICD-10-CM | POA: Diagnosis not present

## 2013-01-01 DIAGNOSIS — Q859 Phakomatosis, unspecified: Secondary | ICD-10-CM | POA: Diagnosis not present

## 2013-01-01 HISTORY — DX: Type 2 diabetes mellitus without complications: E11.9

## 2013-01-01 LAB — CBC WITH DIFFERENTIAL (CANCER CENTER ONLY)
BASO%: 0.5 % (ref 0.0–2.0)
EOS%: 0.9 % (ref 0.0–7.0)
LYMPH%: 14.6 % (ref 14.0–48.0)
MCH: 26.8 pg — ABNORMAL LOW (ref 28.0–33.4)
MCV: 84 fL (ref 82–98)
MONO%: 13.1 % — ABNORMAL HIGH (ref 0.0–13.0)
Platelets: 180 10*3/uL (ref 145–400)
RDW: 14.3 % (ref 11.1–15.7)

## 2013-01-01 LAB — CMP (CANCER CENTER ONLY)
AST: 35 U/L (ref 11–38)
Alkaline Phosphatase: 219 U/L — ABNORMAL HIGH (ref 26–84)
BUN, Bld: 13 mg/dL (ref 7–22)
Glucose, Bld: 333 mg/dL — ABNORMAL HIGH (ref 73–118)
Potassium: 4.7 mEq/L (ref 3.3–4.7)
Total Bilirubin: 0.8 mg/dl (ref 0.20–1.60)

## 2013-01-01 MED ORDER — IOHEXOL 300 MG/ML  SOLN
100.0000 mL | Freq: Once | INTRAMUSCULAR | Status: AC | PRN
  Administered 2013-01-01: 100 mL via INTRAVENOUS

## 2013-01-02 ENCOUNTER — Ambulatory Visit (HOSPITAL_BASED_OUTPATIENT_CLINIC_OR_DEPARTMENT_OTHER): Payer: 59

## 2013-01-02 ENCOUNTER — Ambulatory Visit: Payer: 59 | Admitting: Hematology & Oncology

## 2013-01-02 ENCOUNTER — Ambulatory Visit (HOSPITAL_BASED_OUTPATIENT_CLINIC_OR_DEPARTMENT_OTHER): Payer: 59 | Admitting: Hematology & Oncology

## 2013-01-02 VITALS — BP 109/80 | HR 80 | Temp 97.4°F | Resp 18 | Ht 69.0 in

## 2013-01-02 DIAGNOSIS — C787 Secondary malignant neoplasm of liver and intrahepatic bile duct: Secondary | ICD-10-CM

## 2013-01-02 DIAGNOSIS — A048 Other specified bacterial intestinal infections: Secondary | ICD-10-CM

## 2013-01-02 DIAGNOSIS — C7A8 Other malignant neuroendocrine tumors: Secondary | ICD-10-CM

## 2013-01-02 DIAGNOSIS — C7A00 Malignant carcinoid tumor of unspecified site: Secondary | ICD-10-CM

## 2013-01-02 MED ORDER — OCTREOTIDE ACETATE 30 MG IM KIT
30.0000 mg | PACK | Freq: Once | INTRAMUSCULAR | Status: AC
  Administered 2013-01-02: 30 mg via INTRAMUSCULAR

## 2013-01-02 MED ORDER — AMOXICILL-CLARITHRO-LANSOPRAZ PO MISC: Freq: Two times a day (BID) | ORAL | Status: DC

## 2013-01-02 NOTE — Progress Notes (Signed)
This office note has been dictated.

## 2013-01-02 NOTE — Patient Instructions (Signed)

## 2013-01-03 NOTE — Progress Notes (Signed)
CC:   Harish C. [name], M.D. Electron Vida Rigger, MD, Fax [phone]  DIAGNOSES: 1. 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  CURRENT THERAPY: 1. The patient is status post intrahepatic therapy with yttrium-90. 2. Sandostatin 30 mg IM q.monthly.  INTERIM HISTORY:  Mr. Routh comes in for his followup.  He had his yttrium therapy about 6 weeks ago.  We did go ahead and repeat a CT scan on him.  This was done yesterday.  The CT scan showed improved liver metastases.  He had stable disease elsewhere.  Because of the results of the scan, I think we can just follow him off systemic therapy.  Again, he is doing well with Sandostatin and we will continue this monthly.  He still has the bloating.  I believe that he has helicobacter infection.  I will go ahead and treat this.  Hopefully this will help him feel better.  He has had some cloudiness of the urine.  We will send off a urine culture on him.  There is really not much in the way of diarrhea.  He did have C difficile previously but this does not appear to be active.  We are following his chromogranin A level.  His chromogranin A when we last checked in December was 4.2.  PHYSICAL EXAMINATION:  General:  This is a fairly well-developed, well- nourished white gentleman in no obvious distress.  Vital signs:  Show temperature of 97.4, pulse 80, respiratory rate 18, blood pressure 109/80.  Weight was not taken.  Head and neck:  Shows a normocephalic, atraumatic skull.  There are no ocular or oral lesions.  There are no palpable cervical or supraclavicular lymph nodes.  Lungs:  Clear bilaterally.  Cardiac:  Regular rate and rhythm with a normal S1 and S2. There are no murmurs, rubs or bruits.  Abdomen:  Soft with good bowel sounds.  There is no palpable abdominal mass.  There is no palpable hepatosplenomegaly.  Back:  No tenderness over the spine, ribs or hips. Extremities:  Show no clubbing, cyanosis or  edema.  He is paraplegic.  LABORATORY STUDIES:  White cell count 5.7, hemoglobin 13.7, hematocrit 43, platelet count 180.  Sodium 138, potassium 4.7, BUN 13, creatinine 0.5.  LFTs are normal.  Alkaline phosphatase is 219.  IMPRESSION:  Mr. Radabaugh is a 52 year old gentleman with metastatic neuroendocrine carcinoma.  He had a 2nd intrahepatic treatment with yttrium 90.  He responded very well to the 1st one.  As such a 2nd cycle was indicated.  He did well with this.  He is responding today.  We will continue to hold off on systemic therapy.  He had been on Temodar/Xeloda.  He had 10 cycles of this.  We will plan to get him back in 1 more month.  I do not think we need any scans probably for about 3 months.    ______________________________ [name], M.D. PRE/MEDQ  D:  01/02/2013  T:  01/02/2013  Job:  [id number]

## 2013-01-06 LAB — LACTATE DEHYDROGENASE: LDH: 130 U/L (ref 94–250)

## 2013-01-09 ENCOUNTER — Telehealth: Payer: Self-pay | Admitting: Hematology & Oncology

## 2013-01-09 NOTE — Telephone Encounter (Signed)
Left message moved 2-25 to 2-26

## 2013-01-20 ENCOUNTER — Other Ambulatory Visit: Payer: Self-pay

## 2013-01-30 ENCOUNTER — Other Ambulatory Visit: Payer: 59 | Admitting: Lab

## 2013-01-30 ENCOUNTER — Ambulatory Visit: Payer: 59 | Admitting: Hematology & Oncology

## 2013-01-31 ENCOUNTER — Ambulatory Visit: Payer: 59

## 2013-01-31 ENCOUNTER — Other Ambulatory Visit (HOSPITAL_BASED_OUTPATIENT_CLINIC_OR_DEPARTMENT_OTHER): Payer: 59 | Admitting: Lab

## 2013-01-31 ENCOUNTER — Ambulatory Visit (HOSPITAL_BASED_OUTPATIENT_CLINIC_OR_DEPARTMENT_OTHER): Payer: 59 | Admitting: Hematology & Oncology

## 2013-01-31 VITALS — BP 90/61 | HR 104 | Temp 97.2°F | Resp 16 | Ht 69.0 in

## 2013-01-31 DIAGNOSIS — C7A00 Malignant carcinoid tumor of unspecified site: Secondary | ICD-10-CM | POA: Diagnosis not present

## 2013-01-31 DIAGNOSIS — C787 Secondary malignant neoplasm of liver and intrahepatic bile duct: Secondary | ICD-10-CM

## 2013-01-31 DIAGNOSIS — Q859 Phakomatosis, unspecified: Secondary | ICD-10-CM

## 2013-01-31 DIAGNOSIS — C7A8 Other malignant neuroendocrine tumors: Secondary | ICD-10-CM

## 2013-01-31 DIAGNOSIS — N390 Urinary tract infection, site not specified: Secondary | ICD-10-CM

## 2013-01-31 LAB — CMP (CANCER CENTER ONLY)
Alkaline Phosphatase: 188 U/L — ABNORMAL HIGH (ref 26–84)
BUN, Bld: 17 mg/dL (ref 7–22)
CO2: 31 mEq/L (ref 18–33)
Creat: 0.7 mg/dl (ref 0.6–1.2)
Glucose, Bld: 159 mg/dL — ABNORMAL HIGH (ref 73–118)
Sodium: 140 mEq/L (ref 128–145)
Total Bilirubin: 0.6 mg/dl (ref 0.20–1.60)
Total Protein: 7.4 g/dL (ref 6.4–8.1)

## 2013-01-31 LAB — CBC WITH DIFFERENTIAL (CANCER CENTER ONLY)
BASO#: 0 10*3/uL (ref 0.0–0.2)
EOS%: 1.9 % (ref 0.0–7.0)
Eosinophils Absolute: 0.1 10*3/uL (ref 0.0–0.5)
HCT: 45 % (ref 38.7–49.9)
HGB: 14.3 g/dL (ref 13.0–17.1)
LYMPH%: 19.7 % (ref 14.0–48.0)
MCH: 26 pg — ABNORMAL LOW (ref 28.0–33.4)
MCHC: 31.8 g/dL — ABNORMAL LOW (ref 32.0–35.9)
MCV: 82 fL (ref 82–98)
MONO%: 11.2 % (ref 0.0–13.0)
NEUT%: 66.5 % (ref 40.0–80.0)
RBC: 5.5 10*6/uL (ref 4.20–5.70)

## 2013-01-31 NOTE — Progress Notes (Signed)
This office note has been dictated.

## 2013-02-01 ENCOUNTER — Telehealth: Payer: Self-pay | Admitting: Hematology & Oncology

## 2013-02-01 NOTE — Telephone Encounter (Signed)
Pt will call us Monday to schedule inj. He is aware of 3-26 CT to drink contrast and be NPO 4 hrs. He is also aware of 4-1 MD appointment

## 2013-02-02 NOTE — Progress Notes (Signed)
CC:   [name], M.D. Scott Vida Rigger, [name], Fax [phone]  DIAGNOSES: 1. 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  CURRENT THERAPY:  Sandostatin 30 mg IM monthly.  INTERIM HISTORY:  [name] comes in for his followup.  He is doing fairly well.  We did, I think, try him on some Donnatal to help with his abdominal spasms.  This, unfortunately, really did not help all that much.  He says the spasms are not as bad.  He thinks he may have another urine infection.  He did bring in a urine specimen.  He has a catheter because of paralysis from the waist down.  He has not had problems with diarrhea.  He did have C difficile in the past, but this has not been an issue right now.  He has had no cough.  He has had no fever.  He has had no flushing.  Overall, this is probably the best that I have seen him look.  PHYSICAL EXAMINATION:  General:  This is a well-developed, well- nourished white gentleman in no obvious distress.  Vital signs: Temperature of 97.4, pulse 104, respiratory rate 16, blood pressure 90/61.  Head and neck:  Normocephalic, atraumatic skull.  There are no ocular or oral lesions.  There are no palpable cervical or supraclavicular lymph nodes.  Lungs:  Clear bilaterally.  Cardiac: Regular rate and rhythm with a normal S1 and S2.  There are no murmurs, rubs, or bruits.  Abdomen:  Soft with good bowel sounds.  There is no palpable abdominal mass.  There is no fluid wave.  There is no palpable hepatosplenomegaly.  He has well-healed laparotomy scars.  Extremities: Some trace edema.  Skin:  No rashes, ecchymosis, or petechia. Neurological:  Paraplegia below the waist.  LABORATORY STUDIES:  White cell count is 4.1, hemoglobin 14.3, hematocrit 45, platelet count 231.  IMPRESSION:  [name] is a 52 year old gentleman with metastatic neuroendocrine carcinoma.  This is part of his von Hippel-Lindau syndrome.  Of note, his last  chromogranin A level was 5.2.  I think that we probably can get him set up with another set of scans in about 6 weeks so we can see how things look with respect to the yttrium- 90 results.  Unfortunately, he did not get his Sandostatin today.  We are going to have to try to get him back at some point so he can get this.  I will plan to see him back after he has his scans done.  We try to get these set up sometime in late March and get him back in early April.    ______________________________ [name], M.D. PRE/MEDQ  D:  01/31/2013  T:  02/01/2013  Job:  [id number]

## 2013-02-08 ENCOUNTER — Telehealth: Payer: Self-pay | Admitting: *Deleted

## 2013-02-08 DIAGNOSIS — C7A8 Other malignant neuroendocrine tumors: Secondary | ICD-10-CM

## 2013-02-08 MED ORDER — LEVOFLOXACIN 500 MG PO TABS: 500.0000 mg | ORAL_TABLET | Freq: Every day | ORAL | Status: DC

## 2013-02-08 NOTE — Telephone Encounter (Addendum)
Spoke to pt. Gave him the below message. Will send via e-rx. He will start the abx today.   Message copied by Wynonia Hazard on Thu Feb 08, 2013  1:11 PM ------      Message from: [name]      Created: Sun Feb 04, 2013  1:29 PM       Amy:            [name] has E.Coli in the urine.  We need to let his wife know this and call in Levaquin 500mg  po qd x 5 days.

## 2013-02-26 ENCOUNTER — Encounter (HOSPITAL_COMMUNITY): Payer: Self-pay

## 2013-02-26 ENCOUNTER — Ambulatory Visit (HOSPITAL_COMMUNITY)
Admission: RE | Admit: 2013-02-26 | Discharge: 2013-02-26 | Disposition: A | Discharge status: Home / Self Care | Payer: 59 | Source: Ambulatory Visit | Attending: Hematology & Oncology | Admitting: Hematology & Oncology

## 2013-02-26 DIAGNOSIS — C7A00 Malignant carcinoid tumor of unspecified site: Secondary | ICD-10-CM | POA: Insufficient documentation

## 2013-02-26 DIAGNOSIS — D1809 Hemangioma of other sites: Secondary | ICD-10-CM | POA: Insufficient documentation

## 2013-02-26 DIAGNOSIS — D3A00 Benign carcinoid tumor of unspecified site: Secondary | ICD-10-CM | POA: Diagnosis not present

## 2013-02-26 DIAGNOSIS — Q859 Phakomatosis, unspecified: Secondary | ICD-10-CM | POA: Insufficient documentation

## 2013-02-26 DIAGNOSIS — Z85528 Personal history of other malignant neoplasm of kidney: Secondary | ICD-10-CM | POA: Diagnosis not present

## 2013-02-26 DIAGNOSIS — C7951 Secondary malignant neoplasm of bone: Secondary | ICD-10-CM | POA: Insufficient documentation

## 2013-02-26 DIAGNOSIS — C787 Secondary malignant neoplasm of liver and intrahepatic bile duct: Secondary | ICD-10-CM | POA: Insufficient documentation

## 2013-02-26 DIAGNOSIS — C7A8 Other malignant neuroendocrine tumors: Secondary | ICD-10-CM

## 2013-02-26 DIAGNOSIS — D4959 Neoplasm of unspecified behavior of other genitourinary organ: Secondary | ICD-10-CM | POA: Diagnosis not present

## 2013-02-26 DIAGNOSIS — C7952 Secondary malignant neoplasm of bone marrow: Secondary | ICD-10-CM | POA: Diagnosis not present

## 2013-02-26 DIAGNOSIS — C649 Malignant neoplasm of unspecified kidney, except renal pelvis: Secondary | ICD-10-CM | POA: Diagnosis not present

## 2013-02-26 MED ORDER — IOHEXOL 300 MG/ML  SOLN
100.0000 mL | Freq: Once | INTRAMUSCULAR | Status: AC | PRN
  Administered 2013-02-26: 100 mL via INTRAVENOUS

## 2013-02-28 ENCOUNTER — Ambulatory Visit (HOSPITAL_COMMUNITY)
Admission: RE | Admit: 2013-02-28 | Discharge: 2013-02-28 | Disposition: A | Discharge status: Home / Self Care | Payer: 59 | Source: Ambulatory Visit | Attending: Hematology & Oncology | Admitting: Hematology & Oncology

## 2013-03-06 ENCOUNTER — Ambulatory Visit: Payer: 59 | Admitting: Hematology & Oncology

## 2013-03-06 ENCOUNTER — Ambulatory Visit (HOSPITAL_BASED_OUTPATIENT_CLINIC_OR_DEPARTMENT_OTHER): Payer: 59

## 2013-03-06 ENCOUNTER — Other Ambulatory Visit (HOSPITAL_BASED_OUTPATIENT_CLINIC_OR_DEPARTMENT_OTHER): Payer: 59 | Admitting: Lab

## 2013-03-06 VITALS — BP 133/81 | HR 74 | Temp 97.3°F | Resp 18 | Ht 69.0 in

## 2013-03-06 DIAGNOSIS — C7A00 Malignant carcinoid tumor of unspecified site: Secondary | ICD-10-CM

## 2013-03-06 DIAGNOSIS — G8220 Paraplegia, unspecified: Secondary | ICD-10-CM

## 2013-03-06 DIAGNOSIS — N390 Urinary tract infection, site not specified: Secondary | ICD-10-CM

## 2013-03-06 DIAGNOSIS — C7B8 Other secondary neuroendocrine tumors: Secondary | ICD-10-CM | POA: Diagnosis not present

## 2013-03-06 DIAGNOSIS — C7A8 Other malignant neuroendocrine tumors: Secondary | ICD-10-CM

## 2013-03-06 DIAGNOSIS — Q859 Phakomatosis, unspecified: Secondary | ICD-10-CM | POA: Diagnosis not present

## 2013-03-06 LAB — URINALYSIS, MICROSCOPIC (CHCC SATELLITE)
Ketones: NEGATIVE mg/dL
Protein: NEGATIVE mg/dL
Specific Gravity, Urine: 1.005 (ref 1.003–1.035)
pH: 7.5 (ref 4.60–8.00)

## 2013-03-06 LAB — CBC WITH DIFFERENTIAL (CANCER CENTER ONLY)
BASO%: 0.3 % (ref 0.0–2.0)
EOS%: 1.2 % (ref 0.0–7.0)
LYMPH%: 13.3 % — ABNORMAL LOW (ref 14.0–48.0)
MCH: 26 pg — ABNORMAL LOW (ref 28.0–33.4)
MCV: 80 fL — ABNORMAL LOW (ref 82–98)
MONO%: 10.7 % (ref 0.0–13.0)
NEUT#: 4.3 10*3/uL (ref 1.5–6.5)
Platelets: 239 10*3/uL (ref 145–400)
RDW: 14.1 % (ref 11.1–15.7)

## 2013-03-06 LAB — CMP (CANCER CENTER ONLY)
ALT(SGPT): 50 U/L — ABNORMAL HIGH (ref 10–47)
AST: 81 U/L — ABNORMAL HIGH (ref 11–38)
Alkaline Phosphatase: 220 U/L — ABNORMAL HIGH (ref 26–84)
Creat: 0.6 mg/dl (ref 0.6–1.2)
Sodium: 138 mEq/L (ref 128–145)
Total Bilirubin: 0.7 mg/dl (ref 0.20–1.60)

## 2013-03-06 MED ORDER — OCTREOTIDE ACETATE 30 MG IM KIT
30.0000 mg | PACK | Freq: Once | INTRAMUSCULAR | Status: AC
  Administered 2013-03-06: 30 mg via INTRAMUSCULAR

## 2013-03-07 ENCOUNTER — Telehealth: Payer: Self-pay | Admitting: Emergency Medicine

## 2013-03-07 NOTE — Telephone Encounter (Signed)
Sent pt. email to make him aware that we are submitting new info to insurance and [facility] will contact him to set up his next Y-90 treatment.

## 2013-03-10 LAB — CHROMOGRANIN A: Chromogranin A: 4.8 ng/mL (ref 1.9–15.0)

## 2013-03-10 LAB — LACTATE DEHYDROGENASE: LDH: 165 U/L (ref 94–250)

## 2013-03-12 ENCOUNTER — Other Ambulatory Visit: Payer: Self-pay | Admitting: *Deleted

## 2013-03-12 DIAGNOSIS — N390 Urinary tract infection, site not specified: Secondary | ICD-10-CM

## 2013-03-12 DIAGNOSIS — E109 Type 1 diabetes mellitus without complications: Secondary | ICD-10-CM | POA: Diagnosis not present

## 2013-03-12 MED ORDER — SULFAMETHOXAZOLE-TRIMETHOPRIM 800-160 MG PO TABS: 1.0000 | ORAL_TABLET | Freq: Two times a day (BID) | ORAL | Status: DC

## 2013-03-14 NOTE — Progress Notes (Signed)
DIAGNOSES: 1. 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  CURRENT THERAPY: 1. Patient status post hepatic radioembolization. 2. Sandostatin 30 mg IM monthly.  INTERIM HISTORY:  [name] comes in for his followup.  He is doing okay.  He is feeling fairly well.  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  He did undergo his intrahepatic embolization therapy.  This was, I think, back in November.  He did have a repeat CT scan done.  CT scan was interesting in that it did show a nice response to treatment in most of his liver outside of, I think, the caudate lobe.  His extrahepatic disease really was not an issue.  Everything was stable.  I did speak with Radiology.  They can certainly set him up for another embolization of the caudate lobe.  I think this is reasonable since the initial therapy worked and he tolerated it well.  His extrahepatic disease is under very good control.  He still has some abdominal spasms.  These "come and go."  He is having urinary issues.  He has to catheterize himself.  He is paralyzed.  His urine did grow, I think, E coli and a couple other bacteria.  One was Providencia and 1 was group B strep.  We will put him on some Bactrim for this.  PHYSICAL EXAMINATION:  General:  This is a fairly well-developed, well- nourished white gentleman in no obvious distress.  Vital signs: Temperature of 97.3, pulse 74, respiratory rate 18, blood pressure 133/81.  Weight was not taken.  Head and neck:  Normocephalic, atraumatic skull.  There are no ocular or oral lesions.  There are no palpable cervical or supraclavicular lymph nodes.  Lungs:  Clear bilaterally.  Cardiac:  Regular rate and rhythm with a normal S1 and S2. There are no murmurs, rubs, or bruits.  Abdomen:  Soft with good bowel sounds.  There is no palpable abdominal mass.  There is no fluid wave. There is no palpable  hepatosplenomegaly.  He has well-healed laparotomy scars.  Extremities:  Good strength and range of motion of upper extremities.  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  There is no edema in his legs.  Skin:  No rashes, ecchymoses, or petechia.  LABORATORY STUDIES:  White cell count is 5.8, hemoglobin 14, hematocrit 43, platelet count 239.  Chromogranin A is 4.8.  IMPRESSION:  [name] is a 52 year old gentleman with von Hippel- Lindau syndrome.  He has metastatic neuroendocrine carcinomas.  These are fairly well controlled right now.  He is off systemic therapy.  We had him on Temodar and Xeloda which worked fairly well, but then he began to progress in his liver.  Since he progressed in the liver, we went for intrahepatic therapy which has been effective.  His CAT scan showed a nice response to part of the liver.  He now will see Radiology to see about a second intrahepatic treatment for the caudate lobe.  We will continue to keep him off systemic therapy as his extrahepatic disease appears to be holding nice and steady.  I will go ahead and get him back to see [place] in another month or so.    ______________________________ [name], M.D. PRE/MEDQ  D:  03/13/2013  T:  03/14/2013  Job:  [id number]

## 2013-03-21 ENCOUNTER — Other Ambulatory Visit (HOSPITAL_COMMUNITY): Payer: Self-pay | Admitting: Interventional Radiology

## 2013-03-21 ENCOUNTER — Other Ambulatory Visit: Payer: Self-pay | Admitting: Radiology

## 2013-03-21 DIAGNOSIS — C787 Secondary malignant neoplasm of liver and intrahepatic bile duct: Secondary | ICD-10-CM

## 2013-03-26 ENCOUNTER — Other Ambulatory Visit: Payer: Self-pay | Admitting: Hematology & Oncology

## 2013-04-04 ENCOUNTER — Encounter (HOSPITAL_COMMUNITY): Payer: Self-pay | Admitting: Pharmacy Technician

## 2013-04-04 ENCOUNTER — Other Ambulatory Visit: Payer: Self-pay | Admitting: Radiology

## 2013-04-05 ENCOUNTER — Ambulatory Visit (HOSPITAL_COMMUNITY)
Admission: RE | Admit: 2013-04-05 | Discharge: 2013-04-05 | Disposition: A | Discharge status: Home / Self Care | Payer: 59 | Source: Ambulatory Visit | Attending: Hematology & Oncology | Admitting: Hematology & Oncology

## 2013-04-05 DIAGNOSIS — C7A00 Malignant carcinoid tumor of unspecified site: Secondary | ICD-10-CM | POA: Insufficient documentation

## 2013-04-05 DIAGNOSIS — E119 Type 2 diabetes mellitus without complications: Secondary | ICD-10-CM | POA: Insufficient documentation

## 2013-04-05 DIAGNOSIS — C787 Secondary malignant neoplasm of liver and intrahepatic bile duct: Secondary | ICD-10-CM | POA: Insufficient documentation

## 2013-04-05 DIAGNOSIS — Q859 Phakomatosis, unspecified: Secondary | ICD-10-CM | POA: Insufficient documentation

## 2013-04-05 LAB — COMPREHENSIVE METABOLIC PANEL
AST: 38 U/L — ABNORMAL HIGH (ref 0–37)
CO2: 32 mEq/L (ref 19–32)
Calcium: 9.5 mg/dL (ref 8.4–10.5)
Creatinine, Ser: 0.61 mg/dL (ref 0.50–1.35)
GFR calc Af Amer: >90 mL/min (ref >90)
GFR calc non Af Amer: >90 mL/min (ref >90)
Glucose, Bld: 153 mg/dL — ABNORMAL HIGH (ref 70–99)

## 2013-04-05 LAB — PROTIME-INR: INR: 1.05 (ref 0.00–1.49)

## 2013-04-05 LAB — CBC WITH DIFFERENTIAL/PLATELET
Basophils Absolute: 0 10*3/uL (ref 0.0–0.1)
Basophils Relative: 1 % (ref 0–1)
Eosinophils Relative: 1 % (ref 0–5)
HCT: 43.5 % (ref 39.0–52.0)
MCHC: 33.1 g/dL (ref 30.0–36.0)
Monocytes Absolute: 0.8 10*3/uL (ref 0.1–1.0)
Neutro Abs: 4.7 10*3/uL (ref 1.7–7.7)
RDW: 13.4 % (ref 11.5–15.5)

## 2013-04-05 LAB — APTT: aPTT: 36 seconds (ref 24–37)

## 2013-04-10 ENCOUNTER — Ambulatory Visit (HOSPITAL_BASED_OUTPATIENT_CLINIC_OR_DEPARTMENT_OTHER): Payer: 59 | Admitting: Hematology & Oncology

## 2013-04-10 ENCOUNTER — Ambulatory Visit (HOSPITAL_BASED_OUTPATIENT_CLINIC_OR_DEPARTMENT_OTHER): Payer: 59

## 2013-04-10 ENCOUNTER — Telehealth: Payer: Self-pay | Admitting: Hematology & Oncology

## 2013-04-10 ENCOUNTER — Other Ambulatory Visit (HOSPITAL_BASED_OUTPATIENT_CLINIC_OR_DEPARTMENT_OTHER): Payer: 59 | Admitting: Lab

## 2013-04-10 ENCOUNTER — Other Ambulatory Visit: Payer: Self-pay | Admitting: Radiology

## 2013-04-10 VITALS — BP 118/74 | HR 82 | Temp 97.7°F | Resp 16 | Ht 69.0 in

## 2013-04-10 DIAGNOSIS — Q859 Phakomatosis, unspecified: Secondary | ICD-10-CM

## 2013-04-10 DIAGNOSIS — C7A00 Malignant carcinoid tumor of unspecified site: Secondary | ICD-10-CM

## 2013-04-10 DIAGNOSIS — C7A8 Other malignant neuroendocrine tumors: Secondary | ICD-10-CM

## 2013-04-10 DIAGNOSIS — C7B8 Other secondary neuroendocrine tumors: Secondary | ICD-10-CM

## 2013-04-10 LAB — CBC WITH DIFFERENTIAL (CANCER CENTER ONLY)
BASO%: 0.5 % (ref 0.0–2.0)
LYMPH#: 1 10*3/uL (ref 0.9–3.3)
MONO#: 0.7 10*3/uL (ref 0.1–0.9)
Platelets: 222 10*3/uL (ref 145–400)
RDW: 14 % (ref 11.1–15.7)
WBC: 6.5 10*3/uL (ref 4.0–10.0)

## 2013-04-10 LAB — CHROMOGRANIN A: Chromogranin A: 2.2 ng/mL (ref 1.9–15.0)

## 2013-04-10 MED ORDER — OCTREOTIDE ACETATE 30 MG IM KIT
30.0000 mg | PACK | Freq: Once | INTRAMUSCULAR | Status: AC
  Administered 2013-04-10: 30 mg via INTRAMUSCULAR

## 2013-04-10 NOTE — Patient Instructions (Signed)

## 2013-04-10 NOTE — Telephone Encounter (Signed)
Mailed out June apt calendar to patient's home

## 2013-04-10 NOTE — Progress Notes (Signed)
This office note has been dictated.

## 2013-04-11 ENCOUNTER — Other Ambulatory Visit (HOSPITAL_COMMUNITY): Payer: Self-pay | Admitting: Interventional Radiology

## 2013-04-11 ENCOUNTER — Ambulatory Visit (HOSPITAL_COMMUNITY): Payer: 59

## 2013-04-11 ENCOUNTER — Encounter (HOSPITAL_COMMUNITY): Payer: Self-pay

## 2013-04-11 ENCOUNTER — Ambulatory Visit (HOSPITAL_COMMUNITY)
Admission: RE | Admit: 2013-04-11 | Discharge: 2013-04-11 | Disposition: A | Discharge status: Home / Self Care | Payer: 59 | Source: Ambulatory Visit | Attending: Interventional Radiology | Admitting: Interventional Radiology

## 2013-04-11 ENCOUNTER — Encounter (HOSPITAL_COMMUNITY)
Admission: RE | Admit: 2013-04-11 | Discharge: 2013-04-11 | Disposition: A | Discharge status: Home / Self Care | Payer: 59 | Source: Ambulatory Visit | Attending: Interventional Radiology | Admitting: Interventional Radiology

## 2013-04-11 DIAGNOSIS — C787 Secondary malignant neoplasm of liver and intrahepatic bile duct: Secondary | ICD-10-CM | POA: Insufficient documentation

## 2013-04-11 DIAGNOSIS — E119 Type 2 diabetes mellitus without complications: Secondary | ICD-10-CM | POA: Diagnosis not present

## 2013-04-11 DIAGNOSIS — C7A1 Malignant poorly differentiated neuroendocrine tumors: Secondary | ICD-10-CM | POA: Diagnosis not present

## 2013-04-11 DIAGNOSIS — Z79899 Other long term (current) drug therapy: Secondary | ICD-10-CM | POA: Insufficient documentation

## 2013-04-11 DIAGNOSIS — G8220 Paraplegia, unspecified: Secondary | ICD-10-CM | POA: Diagnosis not present

## 2013-04-11 DIAGNOSIS — Q859 Phakomatosis, unspecified: Secondary | ICD-10-CM | POA: Insufficient documentation

## 2013-04-11 DIAGNOSIS — C7A00 Malignant carcinoid tumor of unspecified site: Secondary | ICD-10-CM | POA: Insufficient documentation

## 2013-04-11 LAB — BASIC METABOLIC PANEL
BUN: 14 mg/dL (ref 6–23)
CO2: 27 mEq/L (ref 19–32)
Calcium: 9 mg/dL (ref 8.4–10.5)
Creatinine, Ser: 0.58 mg/dL (ref 0.50–1.35)

## 2013-04-11 LAB — CBC
MCH: 26.1 pg (ref 26.0–34.0)
MCV: 78.6 fL (ref 78.0–100.0)
Platelets: 232 10*3/uL (ref 150–400)
RBC: 5.52 MIL/uL (ref 4.22–5.81)
RDW: 13.4 % (ref 11.5–15.5)

## 2013-04-11 LAB — PROTIME-INR: Prothrombin Time: 13.1 seconds (ref 11.6–15.2)

## 2013-04-11 MED ORDER — OCTREOTIDE ACETATE 100 MCG/ML IJ SOLN
200.0000 ug | Freq: Once | INTRAMUSCULAR | Status: DC
  Filled 2013-04-11: qty 2

## 2013-04-11 MED ORDER — MIDAZOLAM HCL 2 MG/2ML IJ SOLN
INTRAMUSCULAR | Status: AC
  Filled 2013-04-11: qty 6

## 2013-04-11 MED ORDER — ONDANSETRON HCL 4 MG/2ML IJ SOLN
4.0000 mg | Freq: Once | INTRAMUSCULAR | Status: AC
  Administered 2013-04-11: 4 mg via INTRAVENOUS
  Filled 2013-04-11: qty 2

## 2013-04-11 MED ORDER — SODIUM CHLORIDE 0.9 % IV SOLN
INTRAVENOUS | Status: DC
  Administered 2013-04-11: 20 mL/h via INTRAVENOUS

## 2013-04-11 MED ORDER — PANTOPRAZOLE SODIUM 40 MG IV SOLR
40.0000 mg | Freq: Once | INTRAVENOUS | Status: AC
  Administered 2013-04-11: 40 mg via INTRAVENOUS
  Filled 2013-04-11 (×2): qty 40

## 2013-04-11 MED ORDER — DEXAMETHASONE SODIUM PHOSPHATE 10 MG/ML IJ SOLN
20.0000 mg | Freq: Once | INTRAMUSCULAR | Status: AC
  Administered 2013-04-11: 20 mg via INTRAVENOUS
  Filled 2013-04-11: qty 2

## 2013-04-11 MED ORDER — HYDROCODONE-ACETAMINOPHEN 5-325 MG PO TABS: 1.0000 | ORAL_TABLET | ORAL | Status: DC | PRN

## 2013-04-11 MED ORDER — MIDAZOLAM HCL 2 MG/2ML IJ SOLN
INTRAMUSCULAR | Status: AC | PRN
  Administered 2013-04-11 (×3): 1 mg via INTRAVENOUS

## 2013-04-11 MED ORDER — LIDOCAINE HCL 1 % IJ SOLN
INTRAMUSCULAR | Status: AC
  Filled 2013-04-11: qty 20

## 2013-04-11 MED ORDER — KETOROLAC TROMETHAMINE 30 MG/ML IJ SOLN
30.0000 mg | Freq: Once | INTRAMUSCULAR | Status: AC
  Administered 2013-04-11: 30 mg via INTRAVENOUS
  Filled 2013-04-11: qty 1

## 2013-04-11 MED ORDER — PANTOPRAZOLE SODIUM 40 MG IV SOLR
INTRAVENOUS | Status: AC
  Filled 2013-04-11: qty 40

## 2013-04-11 MED ORDER — IOHEXOL 300 MG/ML  SOLN
80.0000 mL | Freq: Once | INTRAMUSCULAR | Status: AC | PRN
  Administered 2013-04-11: 38 mL via INTRA_ARTERIAL

## 2013-04-11 MED ORDER — FENTANYL CITRATE 0.05 MG/ML IJ SOLN
INTRAMUSCULAR | Status: AC | PRN
  Administered 2013-04-11 (×3): 50 ug via INTRAVENOUS

## 2013-04-11 MED ORDER — FENTANYL CITRATE 0.05 MG/ML IJ SOLN
INTRAMUSCULAR | Status: AC
  Filled 2013-04-11: qty 6

## 2013-04-11 MED ORDER — PIPERACILLIN-TAZOBACTAM 3.375 G IVPB
3.3750 g | Freq: Once | INTRAVENOUS | Status: AC
  Administered 2013-04-11: 3.375 g via INTRAVENOUS
  Filled 2013-04-11: qty 50

## 2013-04-11 MED ORDER — YTTRIUM 90 INJECTION
13.9000 | INJECTION | Freq: Once | INTRAVENOUS | Status: AC
  Administered 2013-04-11: 13.9 via INTRAVENOUS

## 2013-04-11 NOTE — ED Notes (Signed)
Transported to [facility] via stretcher with RN/monitor for post Y-90 imaging.

## 2013-04-11 NOTE — Procedures (Signed)
Left hepatic art Y90 embolization No complication No blood loss. See complete dictation in [facility].

## 2013-04-11 NOTE — H&P (Signed)
[name]. is an 52 y.o. male.   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  Past Medical History  Diagnosis Date  . Von Hippel-Lindau syndrome   . 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 Diabetes mellitus without complication     History reviewed. No pertinent past surgical history.  History reviewed. No pertinent family history. Social History:  reports that he quit smoking about 4 years ago. He does not have any smokeless tobacco history on file. He reports that  drinks alcohol. He reports that he does not use illicit drugs.  Allergies:  Allergies  Allergen Reactions  . Ciprocin-Fluocin-Procin (Fluocinolone Acetonide) Rash    Pt states this happened with IV Cipro. ABLE TO TAKE PO CIPRO.    Current outpatient prescriptions:baclofen (LIORESAL) 20 MG tablet, Take 20 mg by mouth 6 (six) times daily. Every 3 Hours., Disp: , Rfl: ;  diazepam (VALIUM) 10 MG tablet, Take 10 mg by mouth 3 (three) times daily as needed (muscle spasms)., Disp: , Rfl: ;  HYDROcodone-acetaminophen (VICODIN) 5-500 MG per tablet, Take 1 tablet by mouth every 6 (six) hours as needed for pain. , Disp: , Rfl:  insulin lispro (HUMALOG) 100 UNIT/ML injection, Inject into the skin 3 (three) times daily before meals. 1 unit for each 15g of carbohydrates TID with meals, Disp: , Rfl: ;  midodrine (PROAMATINE) 5 MG tablet, Take 5 mg by mouth every 6 (six) hours as needed (for blood pressure). , Disp: , Rfl: ;  Multiple Vitamin (MULTIVITAMIN) capsule, Take 1 capsule by mouth daily. Without folic acid., Disp: , Rfl:  Pancrelipase, Lip-Prot-Amyl, 24000 UNITS CPEP, Take 3 capsules by mouth 3 (three) times daily with meals., Disp: , Rfl: ;  traMADol (ULTRAM) 50 MG  tablet, Take 50 mg by mouth 3 (three) times daily as needed for pain., Disp: , Rfl: ;  chlorproMAZINE (THORAZINE) 10 MG tablet, Take 1 tablet (10 mg total) by mouth every 6 (six) hours as needed., Disp: 40 tablet, Rfl: 0 insulin glargine (LANTUS) 100 UNIT/ML injection, Inject 30 Units into the skin daily before breakfast. , Disp: , Rfl: ;  mirtazapine (REMERON) 15 MG tablet, Take 15 mg by mouth at bedtime as needed. For sleep, Disp: , Rfl: ;  promethazine (PHENERGAN) 12.5 MG tablet, Take 12.5 mg by mouth every 6 (six) hours as needed for nausea. , Disp: , Rfl:  tiZANidine (ZANAFLEX) 4 MG tablet, Take 8 mg by mouth every 6 (six) hours as needed (for pain)., Disp: , Rfl:  Current facility-administered medications:0.9 %  sodium chloride infusion, , Intravenous, Continuous, Brayton El, PA-C, Last Rate: 20 mL/hr at 04/11/13 0725, 20 mL/hr at 04/11/13 0725;  octreotide (SANDOSTATIN) injection 200 mcg, 200 mcg, Intravenous, Once, [employer], PA-C;  pantoprazole (PROTONIX) injection 40 mg, 40 mg, Intravenous, Once, [employer], PA-C piperacillin-tazobactam (ZOSYN) IVPB 3.375 g, 3.375 g, Intravenous, Once, Brayton El, PA-C   Results for orders placed in visit on 04/10/13 (from the past 48 hour(s))  CBC WITH DIFFERENTIAL ([facility])     Status: Abnormal   Collection Time    04/10/13  9:27 AM      Result Value Range   WBC 6.5  4.0 - 10.0 10e3/uL   RBC 5.64  4.20 - 5.70 10e6/uL   HGB 14.7  13.0 -  17.1 g/dL   HCT 16.1  09.6 - 04.5 %   MCV 81 (*) 82 - 98 fL   MCH 26.1 (*) 28.0 - 33.4 pg   MCHC 32.2  32.0 - 35.9 g/dL   RDW 40.9  81.1 - 91.4 %   Platelets 222  145 - 400 10e3/uL   NEUT# 4.8  1.5 - 6.5 10e3/uL   LYMPH# 1.0  0.9 - 3.3 10e3/uL   MONO# 0.7  0.1 - 0.9 10e3/uL   Eosinophils Absolute 0.1  0.0 - 0.5 10e3/uL   BASO# 0.0  0.0 - 0.2 10e3/uL   NEUT% 73.3  40.0 - 80.0 %   LYMPH% 14.6  14.0 - 48.0 %   MONO% 10.1  0.0 - 13.0 %   EOS% 1.5  0.0 - 7.0 %   BASO% 0.5  0.0 - 2.0 %   COMPREHENSIVE METABOLIC PANEL     Status: Abnormal (Preliminary result)   Collection Time    04/10/13  9:27 AM      Result Value Range   Sodium 136  135 - 145 mEq/L   Potassium 3.7  3.5 - 5.3 mEq/L   Chloride 97  96 - 112 mEq/L   CO2 30  19 - 32 mEq/L   Glucose, Bld 128 (*) 70 - 99 mg/dL   BUN 18  6 - 23 mg/dL   Creatinine, Ser 7.82  0.50 - 1.35 mg/dL   Total Bilirubin 0.7  0.3 - 1.2 mg/dL   Alkaline Phosphatase 245 (*) 39 - 117 U/L   AST 33  0 - 37 U/L   ALT 32  0 - 53 U/L   Total Protein 6.9  6.0 - 8.3 g/dL   Albumin 3.7  3.5 - 5.2 g/dL   Calcium 9.3  8.4 - 95.6 mg/dL   No results found.  Review of Systems  Constitutional: Negative for fever and chills.       Occ hot flashes  Respiratory: Negative for cough and shortness of breath.   Cardiovascular: Negative for chest pain.  Gastrointestinal:       Hx of recent nausea/abd pain/loose stools  Musculoskeletal: Negative for back pain.  Neurological: Negative for headaches.  Endo/Heme/Allergies: Does not bruise/bleed easily.    Blood pressure 126/78, pulse 81, temperature 97.2 F (36.2 C), temperature source Oral, resp. rate 20, height 6\' 2"  (1.88 m), weight 150 lb (68.04 kg), SpO2 96.00%. Physical Exam  Constitutional: He is oriented to person, place, and time. He appears well-developed and well-nourished.  Cardiovascular: Normal rate and regular rhythm.   Respiratory: Effort normal and breath sounds normal.  GI: Soft. Bowel sounds are normal. There is no tenderness.  Musculoskeletal:  Paraplegic from waist down; no edema  Neurological: He is alert and oriented to person, place, and time.     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 Plan is for Y-90 radioembolization to left hepatic/caudate lobe today. Details of above d/w pt with his understanding and consent.   Dillon Mcreynolds,D KEVIN 04/11/2013, 8:16  AM

## 2013-04-11 NOTE — Progress Notes (Signed)
DIAGNOSES: 1. 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  CURRENT THERAPY: 1. Patient to have a second hepatic radioembolization on 04/11/2013. 2. Sandostatin 30 mg IM monthly.  INTERIM HISTORY:  [name] comes in for his followup.  He is looking quite good.  He goes for his second radioembolization procedure tomorrow.  This will involve the caudate lobe of the liver.  He does have some abdominal discomfort.  There is no diarrhea.  He has not had any issues with his urine.  He does often get urinary tract infections as he is paralyzed systolic and has to self-catheterize himself.  He has had no cough or shortness of breath.  He has had no fever.  His last urine culture back in April showed E coli and Providencia.  He was put on Bactrim.  His last chromogranin A level was 4.8 back in April.  He has not had any scans done since March.  We will set him up with some scans in June.  Overall, he really looks good.  Performance status is ECOG 1.  PHYSICAL EXAMINATION:  General:  This is a well-developed, well- nourished white gentleman in no obvious distress.  Vital signs: Temperature of 97.7, pulse 82, respiratory rate 18, blood pressure 118/74.  Head and neck:  Normocephalic, atraumatic skull.  There are no ocular or oral lesions.  There are no palpable cervical or supraclavicular lymph nodes.  Lungs:  Clear bilaterally.  Cardiac: Regular rate and rhythm with a normal S1 and S2.  There are no murmurs, rubs, or bruits.  Abdomen:  Soft with good bowel sounds.  There are well- healed laparotomy scars.  There is no fluid wave.  There is no guarding or rebound tenderness.  There is no palpable hepatosplenomegaly. Extremities:  No clubbing, cyanosis, or edema.  LABORATORY STUDIES:  White cell count is 6.5, hemoglobin 14.7, hematocrit 45.6, platelet count 222.  Sodium 134, potassium 4, BUN 13, creatinine 0.61.  Alkaline phosphatase is 255.  IMPRESSION:   [name] is a 52 year old gentleman with von Hippel- Lindau syndrome.  He has had multiple malignancies.  He has neuroendocrine carcinomas right now.  He has had hemangioblastomas of the spine.  He has also had renal cell carcinoma.  We will go ahead and await the results from his radioembolization.  We will go ahead and plan to have a scan done in about 4 or 5 weeks.  He had his Sandostatin today.  This does help with his symptoms.  I will see him back after his CT scan is done.    ______________________________ [name], M.D. PRE/MEDQ  D:  04/10/2013  T:  04/11/2013  Job:  [id number]

## 2013-04-11 NOTE — ED Notes (Addendum)
5Fr sheath removed from R Fem Artery by Dr. Deanne Coffer.  Hemostasis achieved using Exoseal andmanual pressure X 5 mins. R groin level 0. 2+RDP.

## 2013-04-11 NOTE — ED Notes (Signed)
Gauze/tegaderm bandage applied to puncture in R fem art, level 0, 2+DRP.

## 2013-04-11 NOTE — ED Notes (Signed)
Pt transported to Short Stay to recovery via stretcher with RN.

## 2013-04-16 LAB — COMPREHENSIVE METABOLIC PANEL
ALT: 32 U/L (ref 0–53)
AST: 33 U/L (ref 0–37)
Albumin: 3.7 g/dL (ref 3.5–5.2)
Calcium: 9.3 mg/dL (ref 8.4–10.5)
Chloride: 97 mEq/L (ref 96–112)
Potassium: 3.7 mEq/L (ref 3.5–5.3)

## 2013-04-16 LAB — CHROMOGRANIN A: Chromogranin A: 2.6 ng/mL (ref 1.9–15.0)

## 2013-05-14 ENCOUNTER — Ambulatory Visit (HOSPITAL_COMMUNITY)
Admission: RE | Admit: 2013-05-14 | Discharge: 2013-05-14 | Disposition: A | Discharge status: Home / Self Care | Payer: 59 | Source: Ambulatory Visit | Attending: Hematology & Oncology | Admitting: Hematology & Oncology

## 2013-05-14 DIAGNOSIS — C7A8 Other malignant neuroendocrine tumors: Secondary | ICD-10-CM

## 2013-05-14 DIAGNOSIS — N210 Calculus in bladder: Secondary | ICD-10-CM | POA: Insufficient documentation

## 2013-05-14 DIAGNOSIS — Q859 Phakomatosis, unspecified: Secondary | ICD-10-CM | POA: Insufficient documentation

## 2013-05-14 DIAGNOSIS — K7689 Other specified diseases of liver: Secondary | ICD-10-CM | POA: Insufficient documentation

## 2013-05-14 DIAGNOSIS — C7A00 Malignant carcinoid tumor of unspecified site: Secondary | ICD-10-CM | POA: Insufficient documentation

## 2013-05-14 MED ORDER — IOHEXOL 300 MG/ML  SOLN
100.0000 mL | Freq: Once | INTRAMUSCULAR | Status: AC | PRN
  Administered 2013-05-14: 100 mL via INTRAVENOUS

## 2013-05-15 ENCOUNTER — Other Ambulatory Visit (HOSPITAL_BASED_OUTPATIENT_CLINIC_OR_DEPARTMENT_OTHER): Payer: 59 | Admitting: Lab

## 2013-05-15 ENCOUNTER — Ambulatory Visit (HOSPITAL_BASED_OUTPATIENT_CLINIC_OR_DEPARTMENT_OTHER): Payer: 59

## 2013-05-15 ENCOUNTER — Ambulatory Visit (HOSPITAL_BASED_OUTPATIENT_CLINIC_OR_DEPARTMENT_OTHER): Payer: 59 | Admitting: Hematology & Oncology

## 2013-05-15 VITALS — BP 139/90 | HR 76 | Temp 97.7°F | Resp 18 | Ht 73.0 in

## 2013-05-15 DIAGNOSIS — Q859 Phakomatosis, unspecified: Secondary | ICD-10-CM

## 2013-05-15 DIAGNOSIS — C7A00 Malignant carcinoid tumor of unspecified site: Secondary | ICD-10-CM

## 2013-05-15 DIAGNOSIS — M549 Dorsalgia, unspecified: Secondary | ICD-10-CM | POA: Diagnosis not present

## 2013-05-15 DIAGNOSIS — R319 Hematuria, unspecified: Secondary | ICD-10-CM

## 2013-05-15 DIAGNOSIS — C7A8 Other malignant neuroendocrine tumors: Secondary | ICD-10-CM

## 2013-05-15 DIAGNOSIS — R8290 Unspecified abnormal findings in urine: Secondary | ICD-10-CM

## 2013-05-15 DIAGNOSIS — R82998 Other abnormal findings in urine: Secondary | ICD-10-CM | POA: Diagnosis not present

## 2013-05-15 DIAGNOSIS — C787 Secondary malignant neoplasm of liver and intrahepatic bile duct: Secondary | ICD-10-CM

## 2013-05-15 LAB — CBC WITH DIFFERENTIAL (CANCER CENTER ONLY)
BASO#: 0 10*3/uL (ref 0.0–0.2)
HCT: 41.9 % (ref 38.7–49.9)
HGB: 13.5 g/dL (ref 13.0–17.1)
LYMPH#: 0.7 10*3/uL — ABNORMAL LOW (ref 0.9–3.3)
LYMPH%: 10 % — ABNORMAL LOW (ref 14.0–48.0)
MCHC: 32.2 g/dL (ref 32.0–35.9)
MCV: 82 fL (ref 82–98)
MONO#: 0.7 10*3/uL (ref 0.1–0.9)
NEUT%: 77.2 % (ref 40.0–80.0)
WBC: 6.5 10*3/uL (ref 4.0–10.0)

## 2013-05-15 LAB — URINALYSIS, MICROSCOPIC (CHCC SATELLITE)
Bilirubin (Urine): NEGATIVE
Nitrite: POSITIVE
Protein: NEGATIVE mg/dL
Urobilinogen, UR: 0.2 mg/dL (ref 0.2–1)
pH: 6.5 (ref 4.60–8.00)

## 2013-05-15 LAB — CMP (CANCER CENTER ONLY)
Albumin: 3.1 g/dL — ABNORMAL LOW (ref 3.3–5.5)
BUN, Bld: 10 mg/dL (ref 7–22)
CO2: 34 mEq/L — ABNORMAL HIGH (ref 18–33)
Calcium: 8.8 mg/dL (ref 8.0–10.3)
Glucose, Bld: 305 mg/dL — ABNORMAL HIGH (ref 73–118)
Potassium: 3.6 mEq/L (ref 3.3–4.7)
Sodium: 136 mEq/L (ref 128–145)
Total Protein: 7.5 g/dL (ref 6.4–8.1)

## 2013-05-15 MED ORDER — OCTREOTIDE ACETATE 30 MG IM KIT
30.0000 mg | PACK | Freq: Once | INTRAMUSCULAR | Status: AC
  Administered 2013-05-15: 30 mg via INTRAMUSCULAR

## 2013-05-15 NOTE — Progress Notes (Signed)
This office note has been dictated.

## 2013-05-15 NOTE — Addendum Note (Signed)
Addended by: Wynonia Hazard on: 05/15/2013 11:31 AM   Modules accepted: Orders

## 2013-05-15 NOTE — Progress Notes (Signed)
DIAGNOSES: 1. Metastatic neuroendocrine carcinomas. 2. Von Hippel-Lindau syndrome.  CURRENT THERAPY:  Sandostatin 30 mg IM monthly.  INTERIM HISTORY:  [name] comes in for his followup.  He is doing okay.  He does feel a bit rough right now.  He does have some abdominal discomfort.  He did have another intrahepatic therapy.  This was done about a month ago.  We did repeat his CT scan.  CT scan showed a response in the liver to the radial embolization.  There is no new disease outside the liver.  Everything looks stable.  He does have an enlarging calculus in his bladder.  He has more hematuria.  He does have recurrent bladder infections.  His appetite is doing okay.  He has no nausea or vomiting.  His blood sugars are on the high side.  PHYSICAL EXAMINATION:  General:  This is a well-developed, well- nourished white gentleman.  He comes in a wheelchair.  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  Vital signs:  Show a temperature of 97.7, pulse 76, respiratory rate 18, blood pressure 139/90.  Head and neck:  Shows a normocephalic, atraumatic skull.  There are no ocular or oral lesions. There are no palpable cervical or supraclavicular lymph nodes.  Lungs: Clear bilaterally.  Cardiac:  Regular rate and rhythm with normal S1, S2.  There are no murmurs, rubs or bruits.  Abdomen:  Soft.  He has well- healed laparotomy scars.  There is no fluid wave.  There is no guarding or rebound tenderness.  He has no palpable hepatomegaly.  Extremities: Show paraplegia.  There is no edema in his legs.  Skin:  Shows no rashes.  LABORATORY STUDIES:  White cell count is 6.5, hemoglobin 13.5, hematocrit 41.9, platelet count 190.  Sodium is 136, potassium 3.6, BUN 10, creatinine 0.6.  IMPRESSION:  [name] is a 52 year old gentleman with metastatic neuroendocrine carcinomas with von Hippel-Lindau syndrome.  He did undergo hepatic radioembolization.  This has worked quite nicely.  We had  previously treated him with Xeloda/Temodar.  He has now been off therapy for about 3 or 4 months.  He has actually done quite well off therapy.  We are monitoring his chromogranin A level.  His last level was 3 back in May.  I still feel we can hold off on any systemic therapy for him.  Again, his disease appears to be quite stable.  I probably would not plan for another CT scan now until after Labor Day.  He will get his Sandostatin today.  I will plan to get him back in another month.  I will make a referral to Urology for this bladder stone.  I think this is an issue for him as he continues to have these urinary tract infections.  He does have an indwelling Foley which is not helping.    ______________________________ [name], M.D. PRE/MEDQ  D:  05/15/2013  T:  05/15/2013  Job:  [id number]

## 2013-05-19 LAB — URINE CULTURE

## 2013-05-21 ENCOUNTER — Other Ambulatory Visit: Payer: Self-pay | Admitting: *Deleted

## 2013-05-21 DIAGNOSIS — Q859 Phakomatosis, unspecified: Secondary | ICD-10-CM | POA: Diagnosis not present

## 2013-05-21 DIAGNOSIS — N319 Neuromuscular dysfunction of bladder, unspecified: Secondary | ICD-10-CM | POA: Diagnosis not present

## 2013-05-21 DIAGNOSIS — N210 Calculus in bladder: Secondary | ICD-10-CM | POA: Diagnosis not present

## 2013-05-21 DIAGNOSIS — C649 Malignant neoplasm of unspecified kidney, except renal pelvis: Secondary | ICD-10-CM | POA: Diagnosis not present

## 2013-05-22 DIAGNOSIS — H35379 Puckering of macula, unspecified eye: Secondary | ICD-10-CM | POA: Diagnosis not present

## 2013-05-22 DIAGNOSIS — E109 Type 1 diabetes mellitus without complications: Secondary | ICD-10-CM | POA: Diagnosis not present

## 2013-05-22 DIAGNOSIS — D1809 Hemangioma of other sites: Secondary | ICD-10-CM | POA: Diagnosis not present

## 2013-05-22 DIAGNOSIS — Q859 Phakomatosis, unspecified: Secondary | ICD-10-CM | POA: Diagnosis not present

## 2013-05-31 ENCOUNTER — Other Ambulatory Visit: Payer: Self-pay | Admitting: Hematology & Oncology

## 2013-06-26 ENCOUNTER — Ambulatory Visit (HOSPITAL_BASED_OUTPATIENT_CLINIC_OR_DEPARTMENT_OTHER): Payer: 59

## 2013-06-26 ENCOUNTER — Other Ambulatory Visit (HOSPITAL_BASED_OUTPATIENT_CLINIC_OR_DEPARTMENT_OTHER): Payer: 59 | Admitting: Lab

## 2013-06-26 ENCOUNTER — Ambulatory Visit (HOSPITAL_BASED_OUTPATIENT_CLINIC_OR_DEPARTMENT_OTHER): Payer: 59 | Admitting: Hematology & Oncology

## 2013-06-26 VITALS — BP 140/96 | HR 83 | Temp 97.4°F | Resp 18

## 2013-06-26 DIAGNOSIS — C787 Secondary malignant neoplasm of liver and intrahepatic bile duct: Secondary | ICD-10-CM

## 2013-06-26 DIAGNOSIS — C7A8 Other malignant neuroendocrine tumors: Secondary | ICD-10-CM

## 2013-06-26 DIAGNOSIS — C7A00 Malignant carcinoid tumor of unspecified site: Secondary | ICD-10-CM

## 2013-06-26 DIAGNOSIS — E340 Carcinoid syndrome: Secondary | ICD-10-CM

## 2013-06-26 DIAGNOSIS — Q859 Phakomatosis, unspecified: Secondary | ICD-10-CM

## 2013-06-26 LAB — CBC WITH DIFFERENTIAL (CANCER CENTER ONLY)
BASO%: 0.4 % (ref 0.0–2.0)
Eosinophils Absolute: 0.1 10*3/uL (ref 0.0–0.5)
HCT: 46.5 % (ref 38.7–49.9)
LYMPH#: 0.9 10*3/uL (ref 0.9–3.3)
LYMPH%: 10.1 % — ABNORMAL LOW (ref 14.0–48.0)
MCV: 83 fL (ref 82–98)
MONO#: 0.8 10*3/uL (ref 0.1–0.9)
NEUT%: 79.5 % (ref 40.0–80.0)
RBC: 5.63 10*6/uL (ref 4.20–5.70)
RDW: 13.7 % (ref 11.1–15.7)
WBC: 8.4 10*3/uL (ref 4.0–10.0)

## 2013-06-26 LAB — CMP (CANCER CENTER ONLY)
Albumin: 3.3 g/dL (ref 3.3–5.5)
CO2: 32 mEq/L (ref 18–33)
Calcium: 9.1 mg/dL (ref 8.0–10.3)
Chloride: 93 mEq/L — ABNORMAL LOW (ref 98–108)
Glucose, Bld: 244 mg/dL — ABNORMAL HIGH (ref 73–118)
Potassium: 4 mEq/L (ref 3.3–4.7)
Sodium: 138 mEq/L (ref 128–145)
Total Protein: 8 g/dL (ref 6.4–8.1)

## 2013-06-26 MED ORDER — HYDROCODONE-ACETAMINOPHEN 5-500 MG PO TABS: 1.0000 | ORAL_TABLET | Freq: Four times a day (QID) | ORAL | Status: DC | PRN

## 2013-06-26 MED ORDER — OCTREOTIDE ACETATE 30 MG IM KIT
30.0000 mg | PACK | Freq: Once | INTRAMUSCULAR | Status: AC
  Administered 2013-06-26: 30 mg via INTRAMUSCULAR

## 2013-06-26 NOTE — Progress Notes (Signed)
This office note has been dictated.

## 2013-06-27 NOTE — Progress Notes (Signed)
DIAGNOSES: 1. 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  CURRENT THERAPY:  Sandostatin 30 mg IM monthly.  INTERIM HISTORY:  [name] comes in for his followup.  He is still doing quite well.  The problem now coming from my point of view, is this bladder stone.  He was seen by Dr. Retta Diones.  He had a CT scan before that did show a large bladder stone.  He has recurrent urinary tract infections.  Dr. Retta Diones wants to just watch this for right now. [name] has had no abdominal pain.  He has had no cough.  He has had no fever.  His blood sugars have been fairly well controlled.  He has had no rashes.  His chromogranin A level has been nice and low.  Back in June it was 3.4.  He has had no issues with bleeding.  PHYSICAL EXAMINATION:  General:  This is a well-developed, well- nourished white gentleman.  He is paraplegia from the waist down.  Vital signs:  Temperature 97.4, pulse 83, respiratory rate 18, blood pressure 140/96.  Head and neck exam:  Normocephalic, atraumatic skull.  There are no ocular or oral lesions.  There are no palpable cervical or supraclavicular lymph nodes.  Lungs:  Clear bilaterally.  Cardiac: Regular rate and rhythm with a normal S1, S2.  He has a 1/6 systolic ejection murmur.  Abdomen:  Soft.  He has well-healed laparotomy scars. There is no fluid wave.  Bowel sounds are slightly decreased.  There is no guarding or rebound tenderness.  There is no palpable hepatosplenomegaly.  Back:  No tenderness over the spine, ribs, or hips. Extremities:  Show no clubbing, cyanosis or edema.  Again, he is paraplegic from the waist down.  LABORATORY STUDIES:  White cell count is 8.4, hemoglobin 14.8, hematocrit 46.5, platelet count 255,000.  Sodium 138, potassium 4, BUN 20, creatinine 0.6.  Glucose is 244.  LFTs are normal.  IMPRESSION:  [name] is a 52 year old gentleman with Von Hippel- Lindau syndrome.  He has metastatic  neuroendocrine tumors.  He did receive intrahepatic radioembolization.  He had 2 sessions of this.  We will go ahead and plan for another CT scan to see how his disease is doing.  He had been previously treated with Xeloda/Temodar.  We actually have had him off this now for over 4 months.  I think as long as his extrahepatic disease is holding stable, we can continue to hold the Temodar/Xeloda.  We are repeating another urine culture on him.  I think that if he does have another urinary tract infection, that he is going to need to have his bladder stone removed.  I will plan to get him back in a month or so.  He gets his Sandostatin today.    ______________________________ [name], M.D. PRE/MEDQ  D:  06/26/2013  T:  06/27/2013  Job:  [id number]

## 2013-07-02 LAB — CHROMOGRANIN A: Chromogranin A: 7.6 ng/mL (ref 1.9–15.0)

## 2013-07-19 ENCOUNTER — Ambulatory Visit (HOSPITAL_COMMUNITY)
Admission: RE | Admit: 2013-07-19 | Discharge: 2013-07-19 | Disposition: A | Discharge status: Home / Self Care | Payer: 59 | Source: Ambulatory Visit | Attending: Hematology & Oncology | Admitting: Hematology & Oncology

## 2013-07-19 DIAGNOSIS — C7A00 Malignant carcinoid tumor of unspecified site: Secondary | ICD-10-CM | POA: Insufficient documentation

## 2013-07-19 DIAGNOSIS — G8220 Paraplegia, unspecified: Secondary | ICD-10-CM | POA: Insufficient documentation

## 2013-07-19 DIAGNOSIS — C787 Secondary malignant neoplasm of liver and intrahepatic bile duct: Secondary | ICD-10-CM | POA: Insufficient documentation

## 2013-07-19 DIAGNOSIS — N289 Disorder of kidney and ureter, unspecified: Secondary | ICD-10-CM | POA: Insufficient documentation

## 2013-07-19 DIAGNOSIS — N3289 Other specified disorders of bladder: Secondary | ICD-10-CM | POA: Insufficient documentation

## 2013-07-19 DIAGNOSIS — K6289 Other specified diseases of anus and rectum: Secondary | ICD-10-CM | POA: Insufficient documentation

## 2013-07-19 DIAGNOSIS — C7A8 Other malignant neuroendocrine tumors: Secondary | ICD-10-CM

## 2013-07-19 DIAGNOSIS — M899 Disorder of bone, unspecified: Secondary | ICD-10-CM | POA: Insufficient documentation

## 2013-07-19 DIAGNOSIS — Q859 Phakomatosis, unspecified: Secondary | ICD-10-CM | POA: Insufficient documentation

## 2013-07-19 DIAGNOSIS — Z79899 Other long term (current) drug therapy: Secondary | ICD-10-CM | POA: Insufficient documentation

## 2013-07-19 DIAGNOSIS — G959 Disease of spinal cord, unspecified: Secondary | ICD-10-CM | POA: Insufficient documentation

## 2013-07-19 MED ORDER — IOHEXOL 300 MG/ML  SOLN
100.0000 mL | Freq: Once | INTRAMUSCULAR | Status: AC | PRN
  Administered 2013-07-19: 100 mL via INTRAVENOUS

## 2013-07-24 ENCOUNTER — Other Ambulatory Visit (HOSPITAL_COMMUNITY): Payer: Self-pay | Admitting: Pulmonary Disease

## 2013-07-24 ENCOUNTER — Ambulatory Visit (HOSPITAL_BASED_OUTPATIENT_CLINIC_OR_DEPARTMENT_OTHER): Payer: 59

## 2013-07-24 ENCOUNTER — Ambulatory Visit (HOSPITAL_BASED_OUTPATIENT_CLINIC_OR_DEPARTMENT_OTHER): Payer: 59 | Admitting: Hematology & Oncology

## 2013-07-24 ENCOUNTER — Other Ambulatory Visit (HOSPITAL_BASED_OUTPATIENT_CLINIC_OR_DEPARTMENT_OTHER): Payer: 59 | Admitting: Lab

## 2013-07-24 VITALS — BP 122/83 | HR 92 | Temp 97.5°F | Resp 18 | Ht 73.0 in

## 2013-07-24 DIAGNOSIS — C7B8 Other secondary neuroendocrine tumors: Secondary | ICD-10-CM

## 2013-07-24 DIAGNOSIS — R1310 Dysphagia, unspecified: Secondary | ICD-10-CM

## 2013-07-24 DIAGNOSIS — E119 Type 2 diabetes mellitus without complications: Secondary | ICD-10-CM

## 2013-07-24 DIAGNOSIS — C7A00 Malignant carcinoid tumor of unspecified site: Secondary | ICD-10-CM

## 2013-07-24 DIAGNOSIS — C7A8 Other malignant neuroendocrine tumors: Secondary | ICD-10-CM

## 2013-07-24 DIAGNOSIS — E340 Carcinoid syndrome: Secondary | ICD-10-CM

## 2013-07-24 DIAGNOSIS — G8220 Paraplegia, unspecified: Secondary | ICD-10-CM

## 2013-07-24 LAB — CMP (CANCER CENTER ONLY)
Albumin: 3 g/dL — ABNORMAL LOW (ref 3.3–5.5)
CO2: 31 mEq/L (ref 18–33)
Glucose, Bld: 253 mg/dL — ABNORMAL HIGH (ref 73–118)
Potassium: 3.5 mEq/L (ref 3.3–4.7)
Sodium: 135 mEq/L (ref 128–145)
Total Protein: 7.3 g/dL (ref 6.4–8.1)

## 2013-07-24 LAB — CBC WITH DIFFERENTIAL (CANCER CENTER ONLY)
BASO#: 0 10*3/uL (ref 0.0–0.2)
Eosinophils Absolute: 0.1 10*3/uL (ref 0.0–0.5)
HGB: 14.1 g/dL (ref 13.0–17.1)
LYMPH#: 0.9 10*3/uL (ref 0.9–3.3)
MCH: 26.3 pg — ABNORMAL LOW (ref 28.0–33.4)
NEUT#: 6.8 10*3/uL — ABNORMAL HIGH (ref 1.5–6.5)
RBC: 5.36 10*6/uL (ref 4.20–5.70)

## 2013-07-24 MED ORDER — OCTREOTIDE ACETATE 30 MG IM KIT
30.0000 mg | PACK | Freq: Once | INTRAMUSCULAR | Status: AC
  Administered 2013-07-24: 30 mg via INTRAMUSCULAR

## 2013-07-24 NOTE — Progress Notes (Signed)
This office note has been dictated.

## 2013-07-24 NOTE — Progress Notes (Signed)
DIAGNOSES: 1. 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  CURRENT THERAPY: 1. Sandostatin 30 mg IM monthly. 2. Patient status post intrahepatic radioimmunotherapy.  INTERIM HISTORY:  [name] comes in for his followup.  We did go ahead and repeat his scans.  The CT scans were done on August 14th.  The scans actually showed improved liver metastasis.  He has no evidence of disease growing outside the liver.  He had multiple enhancing lesions within the spinal cord.  These appear to be relatively stable from what the radiologists tell us.  His liver metastasis again looked improved. He has multiple renal lesions, which again look relatively stable.  He is paraplegic.  He has had no problems with leg swelling.  He has had no bleeding.  He catheterizes himself.  He does have recurrent urinary tract infections.  He has seen Urology because of a large bladder stone.  He has had no fever.  His blood sugars seem to be doing fairly well.  The problem that he has now is that he feels that he is having a hard time swallowing.  He says it is hard for him to swallow food.  He sometimes coughs with liquids.  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  PHYSICAL EXAMINATION:  General:  This is a fairly well-developed, well- nourished white gentleman in no obvious distress.  Vital signs: Temperature or of 97.5, pulse 92, respiratory rate 18, blood pressure 122/83.  Head and neck:  Normocephalic, atraumatic skull.  There are no ocular or oral lesions.  There are no palpable cervical or supraclavicular lymph nodes.  He has no gag reflex.  Lungs:  Clear bilaterally.  Cardiac:  Regular rate and rhythm with a normal S1 and S2. There are no murmurs, rubs or bruits.  Abdomen:  Soft.  He has good bowel sounds.  He has some abdominal spasms right now.  There is no fluid wave.  His laparotomy scars are well healed.  He has no palpable hepatomegaly.  There is no fluid wave.   Extremities:  No clubbing, cyanosis or edema.  LABORATORY STUDIES:  White cell count 8.4, hemoglobin 14.1, hematocrit 44, platelet count 203.  His alkaline phosphatase is 253.  His liver function tests are normal.  IMPRESSION:  [name] is a 52 year old gentleman with metastatic neuroendocrine carcinoma secondary to von Hippel-Lindau syndrome.  My concern now is the dysphagia.  We will get a modified barium swallow on him.  Thankfully, his wife is an occupational therapist.  She knows how to approach the evaluation.  We will see what the barium swallow shows.  [name] goes up to [facility] on September 10th for followup and MRIs. It will be very interesting to see what the C1 spinal cord tumor looks like.  I sincerely hope that this is not a permanent problem for him.  If so, I think that the only way around it would be to get a feeding tube placed, which I am sure he would really prefer not to have.  I will plan to get him back probably in 6 weeks, so that way we can see how things went at the [facility] in [place], [place].    ______________________________ [name], M.D. PRE/MEDQ  D:  07/24/2013  T:  07/24/2013  Job:  [id number]

## 2013-07-25 ENCOUNTER — Other Ambulatory Visit: Payer: Self-pay | Admitting: *Deleted

## 2013-07-25 DIAGNOSIS — C7951 Secondary malignant neoplasm of bone: Secondary | ICD-10-CM

## 2013-07-25 DIAGNOSIS — R1310 Dysphagia, unspecified: Secondary | ICD-10-CM

## 2013-07-25 DIAGNOSIS — C7A8 Other malignant neuroendocrine tumors: Secondary | ICD-10-CM

## 2013-07-26 ENCOUNTER — Ambulatory Visit (HOSPITAL_COMMUNITY)
Admission: RE | Admit: 2013-07-26 | Discharge: 2013-07-26 | Disposition: A | Discharge status: Home / Self Care | Payer: 59 | Source: Ambulatory Visit | Attending: Hematology & Oncology | Admitting: Hematology & Oncology

## 2013-07-26 ENCOUNTER — Ambulatory Visit (HOSPITAL_COMMUNITY)
Admission: RE | Admit: 2013-07-26 | Discharge: 2013-07-26 | Disposition: A | Discharge status: Home / Self Care | Payer: 59 | Source: Ambulatory Visit | Attending: Pulmonary Disease | Admitting: Pulmonary Disease

## 2013-07-26 DIAGNOSIS — R1313 Dysphagia, pharyngeal phase: Secondary | ICD-10-CM | POA: Diagnosis not present

## 2013-07-26 DIAGNOSIS — R1310 Dysphagia, unspecified: Secondary | ICD-10-CM

## 2013-07-26 DIAGNOSIS — C7951 Secondary malignant neoplasm of bone: Secondary | ICD-10-CM

## 2013-07-26 DIAGNOSIS — C7A8 Other malignant neuroendocrine tumors: Secondary | ICD-10-CM

## 2013-07-26 NOTE — Procedures (Signed)
Objective Swallowing Evaluation: Modified Barium Swallowing Study  Patient Details  Name: [name]. MRN: [id number] Date of Birth: 02-23-1961  Today's Date: 07/26/2013 Time: 1100-1145 SLP Time Calculation (min): 45 min  Past Medical History:  Past Medical History  Diagnosis Date  . Von Hippel-Lindau syndrome   . 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 Diabetes mellitus without complication    Past Surgical History: No past surgical history on file. HPI:  52 year old male with metastatic neuroendocrine carcinoma secondary to von Hippel-Lindau syndrome, referred for OPMBS secondary to new c/o difficulty swallowing.  Pt describes changes in sensation anterior neck; difficulty initiating a swallow; tightness in throat.  Describes greater difficulty swallowing solids than liquids.  No esophageal complaints.  Cranial nerve exam reveals intact function V, VII, IX, XII.  Bilateral gag response present.  Phonation is marked by definite changes in vocal quality with hoarseness, pitch tremulation, and volume loss.       Assessment / Plan / Recommendation Clinical Impression  Dysphagia Diagnosis: Mild pharyngeal phase dysphagia  Clinical impression: Pt presents with a mild pharyngeal dysphagia marked by mild delay in swallow initiation,  mildly decreased mobility of the hyolaryngeal complex (decreased anterior and superior elevation), mildly decreased pharyngeal contraction, leading to trace barium residue post pharyngeal wall and consistent vallecular pooling.  Stasis increases with thicker viscosities; sensation intact - residue is cleared with a f/u swallow.  Intermittent, high penetration was observed with nectar-thick liquids.  Thin liquids descended on occasion to the level of the vocal cords, but was ejected from larynx upon completion of swallow.  There was no observed aspiration.    Pt is protecting airway adequately, but shows mild deterioration in motor function, in  conjunction with voice changes, that could be attributable to CN X involvement.  Pt and his wife reviewed the fluoroscopic video in real time and again after the study.  We discussed basic ways to facilitate improved comfort with PO intake, avoiding pills with liquids and mixing solid and liquid consistencies.   Pt scheduled for routine f/u at [facility] later this month.  Eval to be forwarded to Dr. Myna Hidalgo.  No further SLP needs at this time.      Treatment Recommendation  No treatment recommended at this time    Diet Recommendation Regular;Thin liquid   Liquid Administration via: Cup;Straw Medication Administration: Whole meds with puree Supervision: Patient able to self feed Compensations:  (avoid mixed consistencies (liquids/solids simultaneously))      General Date of Onset:  (approximately four weeks ago) Type of Study: Modified Barium Swallowing Study Reason for Referral: Objectively evaluate swallowing function Previous Swallow Assessment: none Diet Prior to this Study: Regular;Thin liquids Temperature Spikes Noted: No Respiratory Status: Room air History of Recent Intubation: No Behavior/Cognition: Alert;Cooperative;Pleasant mood Oral Cavity - Dentition: Adequate natural dentition Oral Motor / Sensory Function: Within functional limits Self-Feeding Abilities: Able to feed self Patient Positioning: Upright in chair Baseline Vocal Quality: Hoarse;Low vocal intensity Volitional Cough: Weak Volitional Swallow: Able to elicit Anatomy: Within functional limits Pharyngeal Secretions: Not observed secondary MBS    Reason for Referral Objectively evaluate swallowing function   Oral Phase Oral Preparation/Oral Phase Oral Phase: WFL   Pharyngeal Phase  Pharyngeal Phase: Impaired Pharyngeal - Nectar Pharyngeal - Nectar Straw: Delayed swallow initiation;Reduced anterior laryngeal mobility;Reduced laryngeal elevation;Reduced pharyngeal peristalsis;Penetration during swallow;Pharyngeal  residue - valleculae Penetration details (nectar straw): Material enters airway, remains ABOVE vocal cords then ejected out  Pharyngeal - Thin Pharyngeal - Thin Cup: Premature spillage  to pyriform sinuses;Reduced pharyngeal peristalsis;Reduced anterior laryngeal mobility;Reduced laryngeal elevation;Penetration during swallow Penetration details (thin cup): Material enters airway, CONTACTS cords then ejected out Pharyngeal - Thin Straw: Premature spillage to pyriform sinuses;Reduced pharyngeal peristalsis;Reduced anterior laryngeal mobility;Reduced laryngeal elevation;Penetration during swallow Penetration details (thin straw): Material enters airway, CONTACTS cords then ejected out Pharyngeal - Solids Pharyngeal - Puree: Delayed swallow initiation;Reduced pharyngeal peristalsis;Reduced anterior laryngeal mobility;Reduced laryngeal elevation;Pharyngeal residue - valleculae Pharyngeal - Mechanical Soft: Delayed swallow initiation;Reduced pharyngeal peristalsis;Reduced anterior laryngeal mobility;Reduced laryngeal elevation;Pharyngeal residue - valleculae  Cervical Esophageal Phase        Cervical Esophageal Phase Cervical Esophageal Phase: WFL.  Brief screen of esophagus revealed no hindrance of barium passage through esophagus.  Further esophageal f/u not warranted at this time - rec cancellation of barium swallow study.    Functional Assessment Tool Used: clinical judgement Functional Limitations: Swallowing Swallow Current Status ([id number]): At least 1 percent but less than 20 percent impaired, limited or restricted Swallow Goal Status [phone]): At least 1 percent but less than 20 percent impaired, limited or restricted Swallow Discharge Status [phone]): At least 1 percent but less than 20 percent impaired, limited or restricted     Aadvika Konen L. Samson Frederic, [place] CCC/SLP Pager [phone]  Blenda Mounts Laurice 07/26/2013, 1:00 PM

## 2013-08-01 DIAGNOSIS — Q859 Phakomatosis, unspecified: Secondary | ICD-10-CM | POA: Diagnosis not present

## 2013-08-08 ENCOUNTER — Telehealth: Payer: Self-pay | Admitting: Hematology & Oncology

## 2013-08-08 NOTE — Telephone Encounter (Signed)
Dr. Myna Hidalgo aware pt will call us to schedule appointments when he leaves [facility] after 08-27-13 surgery

## 2013-08-28 ENCOUNTER — Ambulatory Visit: Payer: 59

## 2013-08-28 ENCOUNTER — Ambulatory Visit: Payer: 59 | Admitting: Hematology & Oncology

## 2013-08-28 ENCOUNTER — Other Ambulatory Visit: Payer: 59 | Admitting: Lab

## 2013-09-10 ENCOUNTER — Other Ambulatory Visit: Payer: Self-pay | Admitting: Hematology & Oncology

## 2013-09-11 ENCOUNTER — Other Ambulatory Visit: Payer: Self-pay | Admitting: Hematology & Oncology

## 2013-09-13 ENCOUNTER — Telehealth: Payer: Self-pay | Admitting: Hematology & Oncology

## 2013-09-13 NOTE — Telephone Encounter (Signed)
Faxed clinicals today for updated case mgmt review to:  [place] [facility]    Fax:    [phone]

## 2013-09-18 DIAGNOSIS — E109 Type 1 diabetes mellitus without complications: Secondary | ICD-10-CM | POA: Diagnosis not present

## 2013-09-21 ENCOUNTER — Other Ambulatory Visit: Payer: Self-pay | Admitting: *Deleted

## 2013-09-21 DIAGNOSIS — L309 Dermatitis, unspecified: Secondary | ICD-10-CM

## 2013-09-21 DIAGNOSIS — C7A8 Other malignant neuroendocrine tumors: Secondary | ICD-10-CM

## 2013-09-21 MED ORDER — CLINDAMYCIN PHOSPHATE 1 % EX LOTN: TOPICAL_LOTION | Freq: Two times a day (BID) | CUTANEOUS | Status: DC

## 2013-09-21 NOTE — Telephone Encounter (Signed)
Pt called with c/o peeling skin around his nose, eyes, mouth, and testicles since starting Afinitor. He hasn't been on it in a year and has tried several lotions but nothing seems to help.Reviewed with Dr Myna Hidalgo. To try Cleocin 1 % lotion. Sent via e-rx to [employer]. Pt verbalized understanding of instructions and will call the office if his sx don't improve.

## 2013-10-02 ENCOUNTER — Inpatient Hospital Stay: Payer: 59 | Admitting: Hematology & Oncology

## 2013-10-02 ENCOUNTER — Ambulatory Visit (HOSPITAL_BASED_OUTPATIENT_CLINIC_OR_DEPARTMENT_OTHER): Payer: 59 | Admitting: Hematology & Oncology

## 2013-10-02 ENCOUNTER — Other Ambulatory Visit (HOSPITAL_BASED_OUTPATIENT_CLINIC_OR_DEPARTMENT_OTHER): Payer: 59 | Admitting: Lab

## 2013-10-02 VITALS — BP 92/60 | HR 103 | Temp 97.8°F | Resp 18

## 2013-10-02 DIAGNOSIS — C787 Secondary malignant neoplasm of liver and intrahepatic bile duct: Secondary | ICD-10-CM

## 2013-10-02 DIAGNOSIS — C7A00 Malignant carcinoid tumor of unspecified site: Secondary | ICD-10-CM

## 2013-10-02 DIAGNOSIS — R1310 Dysphagia, unspecified: Secondary | ICD-10-CM | POA: Diagnosis not present

## 2013-10-02 DIAGNOSIS — C7A8 Other malignant neuroendocrine tumors: Secondary | ICD-10-CM

## 2013-10-02 DIAGNOSIS — Q859 Phakomatosis, unspecified: Secondary | ICD-10-CM

## 2013-10-02 LAB — CMP (CANCER CENTER ONLY)
ALT(SGPT): 38 U/L (ref 10–47)
AST: 62 U/L — ABNORMAL HIGH (ref 11–38)
CO2: 31 mEq/L (ref 18–33)
Sodium: 139 mEq/L (ref 128–145)
Total Bilirubin: 0.7 mg/dl (ref 0.20–1.60)
Total Protein: 7 g/dL (ref 6.4–8.1)

## 2013-10-02 LAB — CBC WITH DIFFERENTIAL (CANCER CENTER ONLY)
Eosinophils Absolute: 0.1 10*3/uL (ref 0.0–0.5)
MONO#: 1 10*3/uL — ABNORMAL HIGH (ref 0.1–0.9)
MONO%: 15.4 % — ABNORMAL HIGH (ref 0.0–13.0)
NEUT#: 4.1 10*3/uL (ref 1.5–6.5)
Platelets: 235 10*3/uL (ref 145–400)
RBC: 4.96 10*6/uL (ref 4.20–5.70)
WBC: 6.4 10*3/uL (ref 4.0–10.0)

## 2013-10-02 MED ORDER — DOXYCYCLINE HYCLATE 100 MG PO TABS: 100.0000 mg | ORAL_TABLET | Freq: Two times a day (BID) | ORAL | Status: DC

## 2013-10-02 MED ORDER — OCTREOTIDE ACETATE 30 MG IM KIT
PACK | INTRAMUSCULAR | Status: AC
  Filled 2013-10-02: qty 1

## 2013-10-02 NOTE — Progress Notes (Signed)
This office note has been dictated.

## 2013-10-02 NOTE — Addendum Note (Signed)
Addended by: Arlan Organ R on: 10/02/2013 03:32 PM   Modules accepted: Orders

## 2013-10-03 NOTE — Progress Notes (Signed)
CC:   [name], M.D.  DIAGNOSIS: 1. 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  CURRENT THERAPY: 1. 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 2. Sandostatin 30 mg IM q.month.  Last dose back in August 2014. 3. Status post intrahepatic radioimmunotherapy.  INTERIM HISTORY:  [name] comes in for a followup.  We last saw [name] back in August.  Since then, he went up to [facility].  He had cervical spine surgery with removal of hemangioblastomas, which are part of the von Hippel-Lindau syndrome.  [name].  Scott improved his swallowing quite nicely.  He was hospitalized twice for about 8-10 days.  While up there, he did not have any scans done.  Again, he has not had the Sandostatin for a couple of months.  His blood sugars have been on the high side.  [name] because of the medication that he received with his surgery.  He has not had any problems with recurrent urinary infections.  He does have a bladder stone.  He sees Dr. Retta Diones for Scott.  He has not had any recurrent UTIs, so hopefully nothing needs to be done with Scott bladder stone.  He has had a rash on his face.  He says [name]  We tried some Cleocin lotion.  Scott helped a little bit but stopped working.  [name].  He has had no cough or shortness of breath.  He has had no issues diarrhea-wise.  Overall, he really looks quite good.  PHYSICAL EXAMINATION:  General:  [name] a well-developed, well- nourished white gentleman.  [name] paraplegic from the waist down.  Vital signs:  Temperature 97.8, pulse 103, respiratory rate 18, blood pressure 92/60.  Weight Murphy 163 pounds.  Head/Neck:  A normocephalic, atraumatic skull.  There are no ocular or oral lesions.  There are no palpable cervical or supraclavicular lymph nodes.  He has no palpable  thyroid. Lungs:  Clear bilaterally.  Cardiac:  A regular rate and rhythm with a normal S1, S2.  There are no murmurs, rubs, or bruits.  Abdomen:  Soft. He has well-healed laparotomy scars.  He has no fluid wave.  There are good bowel sounds.  [name]  His skin exam does show Scott erythematous rash centrally located on his face.  There may be some slight scaling to it.  LABORATORY STUDIES:  [name], hemoglobin 13, hematocrit 41.4, platelet count 235.  [name]  [name] [name]  IMPRESSION:  [name] Murphy a really nice 52 year old gentleman with neuroendocrine carcinomas.  Again, [name]  [name]  [name]  [name]  I think that the Sandostatin might help with Scott rash on his face.  [name]  [name]  We will try to get [name] back  around Thanksgiving.    ______________________________ [name], M.D. PRE/MEDQ  D:  10/02/2013  T:  10/03/2013  Job:  [id number]

## 2013-10-06 LAB — CHROMOGRANIN A: Chromogranin A: 6.6 ng/mL (ref 1.9–15.0)

## 2013-10-08 ENCOUNTER — Other Ambulatory Visit: Payer: Self-pay | Admitting: *Deleted

## 2013-10-08 DIAGNOSIS — R252 Cramp and spasm: Secondary | ICD-10-CM

## 2013-10-08 DIAGNOSIS — G609 Hereditary and idiopathic neuropathy, unspecified: Secondary | ICD-10-CM

## 2013-10-08 MED ORDER — GABAPENTIN 300 MG PO CAPS: 300.0000 mg | ORAL_CAPSULE | Freq: Three times a day (TID) | ORAL | Status: DC

## 2013-10-08 MED ORDER — TIZANIDINE HCL 4 MG PO TABS: 8.0000 mg | ORAL_TABLET | Freq: Four times a day (QID) | ORAL | Status: DC | PRN

## 2013-10-11 ENCOUNTER — Other Ambulatory Visit: Payer: Self-pay

## 2013-10-22 ENCOUNTER — Other Ambulatory Visit: Payer: Self-pay | Admitting: Hematology & Oncology

## 2013-10-25 ENCOUNTER — Other Ambulatory Visit: Payer: Self-pay | Admitting: Hematology & Oncology

## 2013-10-25 ENCOUNTER — Ambulatory Visit (HOSPITAL_COMMUNITY)
Admission: RE | Admit: 2013-10-25 | Discharge: 2013-10-25 | Disposition: A | Discharge status: Home / Self Care | Payer: 59 | Source: Ambulatory Visit | Attending: Hematology & Oncology | Admitting: Hematology & Oncology

## 2013-10-25 DIAGNOSIS — N281 Cyst of kidney, acquired: Secondary | ICD-10-CM | POA: Insufficient documentation

## 2013-10-25 DIAGNOSIS — K6289 Other specified diseases of anus and rectum: Secondary | ICD-10-CM | POA: Insufficient documentation

## 2013-10-25 DIAGNOSIS — N289 Disorder of kidney and ureter, unspecified: Secondary | ICD-10-CM | POA: Insufficient documentation

## 2013-10-25 DIAGNOSIS — C7A8 Other malignant neuroendocrine tumors: Secondary | ICD-10-CM

## 2013-10-25 DIAGNOSIS — I700 Atherosclerosis of aorta: Secondary | ICD-10-CM | POA: Insufficient documentation

## 2013-10-25 DIAGNOSIS — C787 Secondary malignant neoplasm of liver and intrahepatic bile duct: Secondary | ICD-10-CM | POA: Insufficient documentation

## 2013-10-25 MED ORDER — IOHEXOL 300 MG/ML  SOLN
100.0000 mL | Freq: Once | INTRAMUSCULAR | Status: AC | PRN
  Administered 2013-10-25: 100 mL via INTRAVENOUS

## 2013-10-29 ENCOUNTER — Encounter: Payer: Self-pay | Admitting: *Deleted

## 2013-10-30 ENCOUNTER — Ambulatory Visit (HOSPITAL_BASED_OUTPATIENT_CLINIC_OR_DEPARTMENT_OTHER): Payer: 59

## 2013-10-30 ENCOUNTER — Other Ambulatory Visit (HOSPITAL_BASED_OUTPATIENT_CLINIC_OR_DEPARTMENT_OTHER): Payer: 59 | Admitting: Lab

## 2013-10-30 ENCOUNTER — Ambulatory Visit (HOSPITAL_BASED_OUTPATIENT_CLINIC_OR_DEPARTMENT_OTHER): Payer: 59 | Admitting: Hematology & Oncology

## 2013-10-30 VITALS — BP 114/72 | HR 86 | Temp 97.9°F | Resp 18

## 2013-10-30 DIAGNOSIS — C7A8 Other malignant neuroendocrine tumors: Secondary | ICD-10-CM

## 2013-10-30 DIAGNOSIS — C7A00 Malignant carcinoid tumor of unspecified site: Secondary | ICD-10-CM

## 2013-10-30 DIAGNOSIS — E340 Carcinoid syndrome: Secondary | ICD-10-CM

## 2013-10-30 DIAGNOSIS — Q859 Phakomatosis, unspecified: Secondary | ICD-10-CM

## 2013-10-30 DIAGNOSIS — L719 Rosacea, unspecified: Secondary | ICD-10-CM

## 2013-10-30 DIAGNOSIS — C787 Secondary malignant neoplasm of liver and intrahepatic bile duct: Secondary | ICD-10-CM

## 2013-10-30 LAB — CBC WITH DIFFERENTIAL (CANCER CENTER ONLY)
BASO%: 0.3 % (ref 0.0–2.0)
EOS%: 0.4 % (ref 0.0–7.0)
LYMPH%: 12.3 % — ABNORMAL LOW (ref 14.0–48.0)
MCH: 26 pg — ABNORMAL LOW (ref 28.0–33.4)
MCHC: 31.9 g/dL — ABNORMAL LOW (ref 32.0–35.9)
MCV: 82 fL (ref 82–98)
MONO#: 0.6 10*3/uL (ref 0.1–0.9)
MONO%: 7.9 % (ref 0.0–13.0)
Platelets: 234 10*3/uL (ref 145–400)
RDW: 14.6 % (ref 11.1–15.7)
WBC: 7.8 10*3/uL (ref 4.0–10.0)

## 2013-10-30 LAB — CMP (CANCER CENTER ONLY)
ALT(SGPT): 34 U/L (ref 10–47)
AST: 40 U/L — ABNORMAL HIGH (ref 11–38)
Alkaline Phosphatase: 210 U/L — ABNORMAL HIGH (ref 26–84)
BUN, Bld: 18 mg/dL (ref 7–22)
Creat: 0.7 mg/dl (ref 0.6–1.2)
Total Bilirubin: 0.8 mg/dl (ref 0.20–1.60)

## 2013-10-30 MED ORDER — METRONIDAZOLE 1 % EX GEL: Freq: Every day | CUTANEOUS | Status: DC

## 2013-10-30 MED ORDER — OCTREOTIDE ACETATE 30 MG IM KIT
30.0000 mg | PACK | Freq: Once | INTRAMUSCULAR | Status: AC
  Administered 2013-10-30: 30 mg via INTRAMUSCULAR

## 2013-10-30 MED ORDER — OCTREOTIDE ACETATE 30 MG IM KIT
PACK | INTRAMUSCULAR | Status: AC
  Filled 2013-10-30: qty 1

## 2013-10-30 NOTE — Patient Instructions (Signed)

## 2013-11-02 NOTE — Progress Notes (Signed)
This office note has been dictated.

## 2013-11-06 LAB — CHROMOGRANIN A: Chromogranin A: 4.8 ng/mL (ref 1.9–15.0)

## 2013-11-07 DIAGNOSIS — Z905 Acquired absence of kidney: Secondary | ICD-10-CM | POA: Diagnosis not present

## 2013-11-07 DIAGNOSIS — Q859 Phakomatosis, unspecified: Secondary | ICD-10-CM | POA: Diagnosis not present

## 2013-11-07 DIAGNOSIS — E109 Type 1 diabetes mellitus without complications: Secondary | ICD-10-CM | POA: Diagnosis not present

## 2013-11-07 DIAGNOSIS — D1809 Hemangioma of other sites: Secondary | ICD-10-CM | POA: Diagnosis not present

## 2013-11-07 DIAGNOSIS — H35379 Puckering of macula, unspecified eye: Secondary | ICD-10-CM | POA: Diagnosis not present

## 2013-11-10 NOTE — Progress Notes (Signed)
DIAGNOSES: 1. 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  CURRENT THERAPY:  Sandostatin 30 mg IM monthly.  INTERIM HISTORY:  [name] comes in for followup.  He is doing pretty well.  He was up at the [facility] for neck surgery.  This went pretty well.  His blood sugars have been a little on the high side.  His wife is helping him manage this.  He is paraplegic from the waist down, so she really is an Chief of Staff and an incredible wife and is so instrumental in him doing as well as he has done.  We did go ahead and repeat his scans.  They were done on November 20th. Apparently, the scans showed continued improvement in his liver mets. He did undergo intrahepatic therapy earlier this year.  There is no evidence of disease progression outside of the liver.  He does have likely renal cell carcinomas.  Again, his blood sugars have been on the high side.  He does have these abdominal spasms.  He self-catheterizes himself.  He does get urine infections on occasion.  He has had no problems with fevers.  He has had no headaches.  He does have this rash on his face.  It almost looks like rosacea.  He has tried Cleocin.  He has tried some oral antibiotics.  I will try some metronidazole gel.  PHYSICAL EXAMINATION:  General:  This is a fairly well-developed, well- nourished white gentleman.  He is in a wheelchair.  Vital signs:  Show temperature 97.9, pulse 86, respiratory rate 18, blood pressure 114/72. Head and neck:  Shows no ocular or oral lesions.  There are no palpable cervical or supraclavicular lymph nodes.  Lungs:  Clear bilaterally. Cardiac:  Regular rate and rhythm with normal S1, S2.  There are no murmurs, rubs, or bruits.  Abdomen:  Soft.  He has good bowel sounds. There is no palpable abdominal mass.  There are well-healed laparotomy scars.  There is no fluid wave.  He has a little bit of a muscle spasm. There is no palpable hepatomegaly.  Back:   Shows a surgical scar in the cervical spine.  He has no tenderness over the spine, ribs, or hips. Extremities:  Show paralysis in the legs.  There is no swelling in his legs.  Skin:  Shows macular type rash on his face.  LABORATORY STUDIES:  White cell count is 7.8, hemoglobin 13.9, hematocrit 44, platelet count 234.  His blood sugar is 315.  Albumin is 3.3.  IMPRESSION:  [name] is a very nice 52 year old gentleman with von Hippel-Lindau syndrome.  Again, he has metastatic neuroendocrine carcinomas.  He did undergo intrahepatic therapy.  He had been on systemic chemotherapy, but we actually have not had him on this now probably for close to, I think, 8 months if not longer.  His quality of life is incredibly good.  He really did well with his surgery, I think this has helped him out.  We will plan to keep him on the Sandostatin every 4 weeks or so.  I think we will probably try to get him back after the holidays now.    ______________________________ [name], M.D. PRE/MEDQ  D:  11/02/2013  T:  11/10/2013  Job:  [id number]

## 2013-11-15 ENCOUNTER — Telehealth: Payer: Self-pay | Admitting: Hematology & Oncology

## 2013-11-15 NOTE — Telephone Encounter (Signed)
Left message moved 1-5 to 1-8 °

## 2013-12-11 ENCOUNTER — Ambulatory Visit: Payer: 59

## 2013-12-11 ENCOUNTER — Ambulatory Visit: Payer: 59 | Admitting: Hematology & Oncology

## 2013-12-11 ENCOUNTER — Other Ambulatory Visit: Payer: 59 | Admitting: Lab

## 2013-12-18 ENCOUNTER — Ambulatory Visit (HOSPITAL_BASED_OUTPATIENT_CLINIC_OR_DEPARTMENT_OTHER): Payer: 59

## 2013-12-18 ENCOUNTER — Ambulatory Visit (HOSPITAL_BASED_OUTPATIENT_CLINIC_OR_DEPARTMENT_OTHER): Payer: 59 | Admitting: Hematology & Oncology

## 2013-12-18 ENCOUNTER — Encounter: Payer: Self-pay | Admitting: Hematology & Oncology

## 2013-12-18 ENCOUNTER — Other Ambulatory Visit (HOSPITAL_BASED_OUTPATIENT_CLINIC_OR_DEPARTMENT_OTHER): Payer: 59 | Admitting: Lab

## 2013-12-18 VITALS — BP 149/91 | HR 77 | Temp 97.1°F | Resp 18

## 2013-12-18 DIAGNOSIS — C7A00 Malignant carcinoid tumor of unspecified site: Secondary | ICD-10-CM

## 2013-12-18 DIAGNOSIS — C7A8 Other malignant neuroendocrine tumors: Secondary | ICD-10-CM

## 2013-12-18 DIAGNOSIS — Q859 Phakomatosis, unspecified: Secondary | ICD-10-CM

## 2013-12-18 DIAGNOSIS — B3749 Other urogenital candidiasis: Secondary | ICD-10-CM

## 2013-12-18 LAB — CMP (CANCER CENTER ONLY)
ALBUMIN: 3.3 g/dL (ref 3.3–5.5)
ALK PHOS: 195 U/L — AB (ref 26–84)
ALT: 35 U/L (ref 10–47)
AST: 50 U/L — ABNORMAL HIGH (ref 11–38)
BILIRUBIN TOTAL: 0.7 mg/dL (ref 0.20–1.60)
BUN, Bld: 12 mg/dL (ref 7–22)
CO2: 33 mEq/L (ref 18–33)
Calcium: 8.9 mg/dL (ref 8.0–10.3)
Chloride: 98 mEq/L (ref 98–108)
Creat: 0.7 mg/dl (ref 0.6–1.2)
GLUCOSE: 192 mg/dL — AB (ref 73–118)
POTASSIUM: 4.4 meq/L (ref 3.3–4.7)
SODIUM: 135 meq/L (ref 128–145)
TOTAL PROTEIN: 7 g/dL (ref 6.4–8.1)

## 2013-12-18 LAB — CBC WITH DIFFERENTIAL (CANCER CENTER ONLY)
BASO#: 0 10*3/uL (ref 0.0–0.2)
BASO%: 0.4 % (ref 0.0–2.0)
EOS ABS: 0.1 10*3/uL (ref 0.0–0.5)
EOS%: 1.5 % (ref 0.0–7.0)
HCT: 42.9 % (ref 38.7–49.9)
HGB: 13.6 g/dL (ref 13.0–17.1)
LYMPH#: 1.1 10*3/uL (ref 0.9–3.3)
LYMPH%: 15.8 % (ref 14.0–48.0)
MCH: 25.9 pg — ABNORMAL LOW (ref 28.0–33.4)
MCHC: 31.7 g/dL — ABNORMAL LOW (ref 32.0–35.9)
MCV: 82 fL (ref 82–98)
MONO#: 0.5 10*3/uL (ref 0.1–0.9)
MONO%: 8 % (ref 0.0–13.0)
NEUT%: 74.3 % (ref 40.0–80.0)
NEUTROS ABS: 5.1 10*3/uL (ref 1.5–6.5)
PLATELETS: 294 10*3/uL (ref 145–400)
RBC: 5.25 10*6/uL (ref 4.20–5.70)
RDW: 14.2 % (ref 11.1–15.7)
WBC: 6.8 10*3/uL (ref 4.0–10.0)

## 2013-12-18 MED ORDER — OCTREOTIDE ACETATE 30 MG IM KIT
30.0000 mg | PACK | Freq: Once | INTRAMUSCULAR | Status: AC
  Administered 2013-12-18: 30 mg via INTRAMUSCULAR

## 2013-12-18 MED ORDER — FLUCONAZOLE 100 MG PO TABS: 100.0000 mg | ORAL_TABLET | Freq: Every day | ORAL | Status: DC

## 2013-12-18 MED ORDER — OCTREOTIDE ACETATE 30 MG IM KIT
PACK | INTRAMUSCULAR | Status: AC
  Filled 2013-12-18: qty 1

## 2013-12-18 NOTE — Progress Notes (Signed)
DIAGNOSES: 1. 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  CURRENT THERAPY:  Sandostatin 30 mg IM monthly.  INTERIM HISTORY:  [name] comes in for followup.  He is still looking quite good.  He and his wife have always busy.  They are really involved with an animal rescue.  They are particularly involved with coon hound rescue and placement.  [name] has been feeling pretty good.  He says he does feel a little bit tired over the past week.  He says there had been a lot of sediment in his urine.  He is paraplegic.  He is in a wheelchair.  He has no sensation from his waist down.  We have given him Diflucan before.  We will go ahead and give him another dose of Diflucan to try to help with his urine.  He does have diabetes.  He is on insulin.  His wife is incredibly helpful and very diligent with monitoring his blood sugars.  He is swallowing well.  He has had no dysphagia or odynophagia.  His last chromogranin A level, now is checked, back in November was 4.8.  He has had some loose stools, but no frank diarrhea.  He has had no fever.  He has had no rashes.  PHYSICAL EXAMINATION:  General:  This is a fairly well-developed, well- nourished white gentleman, in no obvious distress.  Vital Signs: Temperature of 97.1, pulse 77, respiratory rate 18, blood pressure 149/91.  Weight was not taken.  Head and Neck:  Normocephalic, atraumatic skull.  There are no ocular or oral lesions.  There are no palpable cervical or supraclavicular lymph nodes.  Lungs:  Clear bilaterally.  Cardiac:  Regular rate and rhythm with normal S1, S2. There are no murmurs, rubs, or bruits.  Abdomen:  Soft.  He has good bowel sounds.  There is well-healed laparotomy scar.  He has no fluid wave.  There is no palpable hepatosplenomegaly.  Back:  No tenderness over the spine, ribs, or hips.  Extremities:  No clubbing, cyanosis, or edema.  He has no movement in his legs.  No  rashes are noted. Neurological:  Paraplegia.  LABORATORY STUDIES:  White cell count of 6.8, hemoglobin 13.6, hematocrit 42.9, platelet count 294.  IMPRESSION:  [name] is a very nice 53 year old gentleman with metastatic neuroendocrine carcinomas.  He has a von Hippel-Lindau syndrome.  He has had multiple surgeries.  He has had intrahepatic therapy with radioimmunoisotopes.  I think he underwent this back in May.  For now, we will plan for another followup CT scan on him.  This will be done in February.  We will go ahead and give some Diflucan.  Again, this has seemed to help with his urine.    ______________________________ [name], M.D. PRE/MEDQ  D:  12/17/2013  T:  12/18/2013  Job:  [id number]

## 2013-12-18 NOTE — Progress Notes (Signed)
This office note has been dictated.

## 2013-12-18 NOTE — Patient Instructions (Signed)

## 2013-12-19 ENCOUNTER — Telehealth: Payer: Self-pay | Admitting: Hematology & Oncology

## 2013-12-19 NOTE — Telephone Encounter (Signed)
Left pt message to call for details of 2-5 and 2-10 appointments

## 2013-12-24 LAB — CHROMOGRANIN A: CHROMOGRANIN A: 7.8 ng/mL (ref 1.9–15.0)

## 2013-12-24 LAB — LACTATE DEHYDROGENASE: LDH: 158 U/L (ref 94–250)

## 2013-12-25 ENCOUNTER — Other Ambulatory Visit: Payer: Self-pay | Admitting: Hematology & Oncology

## 2013-12-27 ENCOUNTER — Other Ambulatory Visit: Payer: Self-pay | Admitting: Hematology & Oncology

## 2014-01-02 ENCOUNTER — Other Ambulatory Visit: Payer: Self-pay | Admitting: Hematology & Oncology

## 2014-01-03 ENCOUNTER — Other Ambulatory Visit: Payer: Self-pay | Admitting: Nurse Practitioner

## 2014-01-10 ENCOUNTER — Ambulatory Visit (HOSPITAL_COMMUNITY)
Admission: RE | Admit: 2014-01-10 | Discharge: 2014-01-10 | Disposition: A | Discharge status: Home / Self Care | Payer: 59 | Source: Ambulatory Visit | Attending: Hematology & Oncology | Admitting: Hematology & Oncology

## 2014-01-10 DIAGNOSIS — N3289 Other specified disorders of bladder: Secondary | ICD-10-CM | POA: Diagnosis not present

## 2014-01-10 DIAGNOSIS — C7A00 Malignant carcinoid tumor of unspecified site: Secondary | ICD-10-CM | POA: Insufficient documentation

## 2014-01-10 DIAGNOSIS — C7A8 Other malignant neuroendocrine tumors: Secondary | ICD-10-CM

## 2014-01-10 DIAGNOSIS — C7952 Secondary malignant neoplasm of bone marrow: Secondary | ICD-10-CM | POA: Diagnosis not present

## 2014-01-10 DIAGNOSIS — C7951 Secondary malignant neoplasm of bone: Secondary | ICD-10-CM | POA: Insufficient documentation

## 2014-01-10 DIAGNOSIS — C787 Secondary malignant neoplasm of liver and intrahepatic bile duct: Secondary | ICD-10-CM | POA: Insufficient documentation

## 2014-01-10 DIAGNOSIS — Q859 Phakomatosis, unspecified: Secondary | ICD-10-CM | POA: Diagnosis not present

## 2014-01-10 DIAGNOSIS — C649 Malignant neoplasm of unspecified kidney, except renal pelvis: Secondary | ICD-10-CM | POA: Diagnosis not present

## 2014-01-10 DIAGNOSIS — K6289 Other specified diseases of anus and rectum: Secondary | ICD-10-CM | POA: Diagnosis not present

## 2014-01-10 DIAGNOSIS — B3749 Other urogenital candidiasis: Secondary | ICD-10-CM

## 2014-01-10 MED ORDER — IOHEXOL 300 MG/ML  SOLN
100.0000 mL | Freq: Once | INTRAMUSCULAR | Status: AC | PRN
  Administered 2014-01-10: 100 mL via INTRAVENOUS

## 2014-01-11 ENCOUNTER — Encounter: Payer: Self-pay | Admitting: *Deleted

## 2014-01-11 DIAGNOSIS — N319 Neuromuscular dysfunction of bladder, unspecified: Secondary | ICD-10-CM | POA: Diagnosis not present

## 2014-01-11 DIAGNOSIS — N3020 Other chronic cystitis without hematuria: Secondary | ICD-10-CM | POA: Diagnosis not present

## 2014-01-11 DIAGNOSIS — N210 Calculus in bladder: Secondary | ICD-10-CM | POA: Diagnosis not present

## 2014-01-15 ENCOUNTER — Ambulatory Visit (HOSPITAL_BASED_OUTPATIENT_CLINIC_OR_DEPARTMENT_OTHER): Payer: 59

## 2014-01-15 ENCOUNTER — Other Ambulatory Visit (HOSPITAL_BASED_OUTPATIENT_CLINIC_OR_DEPARTMENT_OTHER): Payer: 59 | Admitting: Lab

## 2014-01-15 ENCOUNTER — Ambulatory Visit (HOSPITAL_BASED_OUTPATIENT_CLINIC_OR_DEPARTMENT_OTHER): Payer: 59 | Admitting: Hematology & Oncology

## 2014-01-15 ENCOUNTER — Encounter: Payer: Self-pay | Admitting: Hematology & Oncology

## 2014-01-15 VITALS — BP 83/58 | HR 63 | Temp 97.4°F | Resp 18 | Ht 69.0 in

## 2014-01-15 DIAGNOSIS — Q859 Phakomatosis, unspecified: Secondary | ICD-10-CM | POA: Diagnosis not present

## 2014-01-15 DIAGNOSIS — C7A8 Other malignant neuroendocrine tumors: Secondary | ICD-10-CM

## 2014-01-15 DIAGNOSIS — C787 Secondary malignant neoplasm of liver and intrahepatic bile duct: Secondary | ICD-10-CM | POA: Diagnosis not present

## 2014-01-15 DIAGNOSIS — C7A00 Malignant carcinoid tumor of unspecified site: Secondary | ICD-10-CM

## 2014-01-15 DIAGNOSIS — R197 Diarrhea, unspecified: Secondary | ICD-10-CM | POA: Diagnosis not present

## 2014-01-15 DIAGNOSIS — E340 Carcinoid syndrome: Secondary | ICD-10-CM

## 2014-01-15 DIAGNOSIS — B3749 Other urogenital candidiasis: Secondary | ICD-10-CM

## 2014-01-15 LAB — CBC WITH DIFFERENTIAL (CANCER CENTER ONLY)
BASO#: 0 10*3/uL (ref 0.0–0.2)
BASO%: 0.3 % (ref 0.0–2.0)
EOS%: 1.6 % (ref 0.0–7.0)
Eosinophils Absolute: 0.1 10*3/uL (ref 0.0–0.5)
HCT: 42.4 % (ref 38.7–49.9)
HGB: 13.4 g/dL (ref 13.0–17.1)
LYMPH#: 1.7 10*3/uL (ref 0.9–3.3)
LYMPH%: 26.8 % (ref 14.0–48.0)
MCH: 25.7 pg — ABNORMAL LOW (ref 28.0–33.4)
MCHC: 31.6 g/dL — AB (ref 32.0–35.9)
MCV: 81 fL — ABNORMAL LOW (ref 82–98)
MONO#: 0.7 10*3/uL (ref 0.1–0.9)
MONO%: 11 % (ref 0.0–13.0)
NEUT%: 60.3 % (ref 40.0–80.0)
NEUTROS ABS: 3.8 10*3/uL (ref 1.5–6.5)
PLATELETS: 267 10*3/uL (ref 145–400)
RBC: 5.22 10*6/uL (ref 4.20–5.70)
RDW: 13.9 % (ref 11.1–15.7)
WBC: 6.3 10*3/uL (ref 4.0–10.0)

## 2014-01-15 LAB — CMP (CANCER CENTER ONLY)
ALBUMIN: 3.1 g/dL — AB (ref 3.3–5.5)
ALT(SGPT): 27 U/L (ref 10–47)
AST: 51 U/L — ABNORMAL HIGH (ref 11–38)
Alkaline Phosphatase: 170 U/L — ABNORMAL HIGH (ref 26–84)
BUN: 16 mg/dL (ref 7–22)
CO2: 33 mEq/L (ref 18–33)
Calcium: 8.9 mg/dL (ref 8.0–10.3)
Chloride: 100 mEq/L (ref 98–108)
Creat: 0.5 mg/dl — ABNORMAL LOW (ref 0.6–1.2)
Glucose, Bld: 79 mg/dL (ref 73–118)
Potassium: 3.8 mEq/L (ref 3.3–4.7)
SODIUM: 138 meq/L (ref 128–145)
Total Bilirubin: 0.6 mg/dl (ref 0.20–1.60)
Total Protein: 7.5 g/dL (ref 6.4–8.1)

## 2014-01-15 MED ORDER — OCTREOTIDE ACETATE 30 MG IM KIT
PACK | INTRAMUSCULAR | Status: AC
Start: 2014-01-15 — End: 2014-01-15
  Filled 2014-01-15: qty 1

## 2014-01-15 MED ORDER — OCTREOTIDE ACETATE 30 MG IM KIT
30.0000 mg | PACK | Freq: Once | INTRAMUSCULAR | Status: AC
  Administered 2014-01-15: 30 mg via INTRAMUSCULAR

## 2014-01-15 NOTE — Progress Notes (Signed)
Hematology and Oncology Follow Up Visit  [name] [id number] 06/22/1961 53 y.o. 01/15/2014   Principle Diagnosis:   Metastatic neuroendocrine carcinoma  Von Hippel-Lindau syndrome  Current Therapy:    Sandostatin 30 mg IM every month     Interim History: [name] is is in for followup. He looks pretty good. His blood sugar was a little on the low side. His wife to care that.  We did have a CT scan done for restaging. This was done on February 5. He has no progression of his disease. His liver metastasis it looked possibly little bit better.  He will have a suprapubic catheter put into his bladder. He is paraplegic. He has a Foley catheter in right now. I think that the suraPUBIC catheter will help him out quite a bit.  We are following his chromogranin A levels. These have been low which is nice. His last level was 7.8.  Is eating okay. There is no nausea vomiting. He's had no cough. He's had no fever.  He recovered very nicely from his spine surgery that was done at [facility] [facility] last September.  Overall, he has a very good performance status. Medications: Current outpatient prescriptions:baclofen (LIORESAL) 20 MG tablet, Take 20 mg by mouth 6 (six) times daily. Every 3 Hours., Disp: , Rfl: ;  diazepam (VALIUM) 5 MG tablet, TAKE 1 TABLET BY MOUTH THREE TIMES DAILY AS NEEDED FOR MUSCLE SPASMS, Disp: 40 tablet, Rfl: 1;  fluconazole (DIFLUCAN) 100 MG tablet, Take 1 tablet (100 mg total) by mouth daily., Disp: 30 tablet, Rfl: 3 HYDROcodone-acetaminophen (VICODIN) 5-500 MG per tablet, Take 1 tablet by mouth every 6 (six) hours as needed for pain., Disp: 60 tablet, Rfl: 3;  insulin glargine (LANTUS) 100 UNIT/ML injection, Inject 30 Units into the skin daily before breakfast. , Disp: , Rfl: ;  insulin lispro (HUMALOG) 100 UNIT/ML injection, Inject into the skin 3 (three) times daily before meals. 1 unit for each 15g of carbohydrates TID with meals, Disp: , Rfl:  metroNIDAZOLE  (METROGEL) 1 % gel, Apply topically daily., Disp: 45 g, Rfl: 0;  midodrine (PROAMATINE) 5 MG tablet, Take 5 mg by mouth every 6 (six) hours as needed (for blood pressure). , Disp: , Rfl: ;  Multiple Vitamin (MULTIVITAMIN) capsule, Take 1 capsule by mouth daily. Without folic acid., Disp: , Rfl: ;  omeprazole (PRILOSEC) 20 MG capsule, Take 20 mg by mouth daily., Disp: , Rfl:  Pancrelipase, Lip-Prot-Amyl, 24000 UNITS CPEP, Take 3 capsules by mouth 3 (three) times daily with meals., Disp: , Rfl: ;  promethazine (PHENERGAN) 12.5 MG tablet, Take 12.5 mg by mouth every 6 (six) hours as needed for nausea. , Disp: , Rfl: ;  tiZANidine (ZANAFLEX) 4 MG tablet, Take 2 tablets (8 mg total) by mouth every 6 (six) hours as needed (for pain)., Disp: 30 tablet, Rfl: 3 traMADol (ULTRAM) 50 MG tablet, TAKE 1 TABLET BY MOUTH 3 TIMES A DAY, Disp: 90 tablet, Rfl: 2  Allergies:  Allergies  Allergen Reactions  . Ciprocin-Fluocin-Procin [Fluocinolone Acetonide] Rash    Pt states this happened with IV Cipro. ABLE TO TAKE PO CIPRO.    Past Medical History, Surgical history, Social history, and Family History were reviewed and updated.  Review of Systems: As above  Physical Exam:  height is 5\' 9"  (1.753 m). His oral temperature is 97.4 F (36.3 C). His blood pressure is 83/58 and his pulse is 63. His respiration  is 18.   This is a well developed well nourished gentleman. He is in a wheelchair. His head and neck exam shows no rash on his face. His pupils react. He has no scleral icterus. There is no oral lesion. There is no adenopathy in his neck. Lungs are clear. Cardiac exam regular rate and rhythm with no murmurs rubs or bruits. Abdomen is soft. Multiple laparotomy scars. There is no fluid wave. He has no abdominal wall spasm. He has no palpable hepato- splenomegaly back exam no tenderness of the spine reassurance. Has a cervical scar in from his surgery. Lower extremities are paralyzed. He has no movement. He has very  little swelling.  Lab Results  Component Value Date   WBC 6.3 01/15/2014   HGB 13.4 01/15/2014   HCT 42.4 01/15/2014   MCV 81* 01/15/2014   PLT 267 01/15/2014     Chemistry      Component Value Date/Time   NA 138 01/15/2014 1116   NA 132* 04/11/2013 0705   K 3.8 01/15/2014 1116   K 3.6 04/11/2013 0705   CL 100 01/15/2014 1116   CL 95* 04/11/2013 0705   CO2 33 01/15/2014 1116   CO2 27 04/11/2013 0705   BUN 16 01/15/2014 1116   BUN 14 04/11/2013 0705   CREATININE 0.5* 01/15/2014 1116   CREATININE 0.58 04/11/2013 0705      Component Value Date/Time   CALCIUM 8.9 01/15/2014 1116   CALCIUM 9.0 04/11/2013 0705   ALKPHOS 170* 01/15/2014 1116   ALKPHOS 245* 04/10/2013 0927   AST 51* 01/15/2014 1116   AST 33 04/10/2013 0927   ALT 27 01/15/2014 1116   ALT 32 04/10/2013 0927   BILITOT 0.60 01/15/2014 1116   BILITOT 0.7 04/10/2013 0927         Impression and Plan: Mr.. Murphy is a 53 year old gentleman. He has on Hippel-Lindau syndrome. He has metastatic neuroendocrine carcinomas. He has had intrahepatic therapy. He has had systemic chemotherapy. He's had recent surgery for cervical spine.  He's done incredibly well. So far the disease has not progressed. He's doing well with Sandostatin.  I don't think we have to do anything different for right now. I don't see where we need to get him on systemic chemotherapy.  We'll plan to get back in one month.  I felt it would be a [place] apply for another 3 months or so.   Scott Napoleon, MD 2/10/201512:35 PM

## 2014-01-16 ENCOUNTER — Other Ambulatory Visit: Payer: Self-pay | Admitting: Urology

## 2014-01-16 ENCOUNTER — Other Ambulatory Visit: Payer: Self-pay | Admitting: Hematology & Oncology

## 2014-01-23 LAB — CHROMOGRANIN A: Chromogranin A: 22 ng/mL — ABNORMAL HIGH (ref 1.9–15.0)

## 2014-01-28 ENCOUNTER — Encounter (HOSPITAL_COMMUNITY): Payer: Self-pay | Admitting: Pharmacy Technician

## 2014-01-29 ENCOUNTER — Other Ambulatory Visit: Payer: Self-pay

## 2014-01-29 ENCOUNTER — Encounter (HOSPITAL_COMMUNITY)
Admission: RE | Admit: 2014-01-29 | Discharge: 2014-01-29 | Disposition: A | Discharge status: Home / Self Care | Payer: 59 | Source: Ambulatory Visit | Attending: Urology | Admitting: Urology

## 2014-01-29 ENCOUNTER — Encounter (HOSPITAL_COMMUNITY): Payer: Self-pay

## 2014-01-29 DIAGNOSIS — Z01812 Encounter for preprocedural laboratory examination: Secondary | ICD-10-CM | POA: Insufficient documentation

## 2014-01-29 DIAGNOSIS — Z01810 Encounter for preprocedural cardiovascular examination: Secondary | ICD-10-CM | POA: Insufficient documentation

## 2014-01-29 HISTORY — DX: Personal history of other medical treatment: Z92.89

## 2014-01-29 HISTORY — DX: Headache: R51

## 2014-01-29 HISTORY — DX: Neoplasm of unspecified behavior of endocrine glands and other parts of nervous system: D49.7

## 2014-01-29 LAB — CBC
HEMATOCRIT: 44.2 % (ref 39.0–52.0)
HEMOGLOBIN: 14.4 g/dL (ref 13.0–17.0)
MCH: 25.9 pg — AB (ref 26.0–34.0)
MCHC: 32.6 g/dL (ref 30.0–36.0)
MCV: 79.6 fL (ref 78.0–100.0)
Platelets: 267 10*3/uL (ref 150–400)
RBC: 5.55 MIL/uL (ref 4.22–5.81)
RDW: 13.7 % (ref 11.5–15.5)
WBC: 9.7 10*3/uL (ref 4.0–10.5)

## 2014-01-29 LAB — BASIC METABOLIC PANEL
BUN: 20 mg/dL (ref 6–23)
CALCIUM: 9.4 mg/dL (ref 8.4–10.5)
CO2: 26 mEq/L (ref 19–32)
CREATININE: 0.8 mg/dL (ref 0.50–1.35)
Chloride: 91 mEq/L — ABNORMAL LOW (ref 96–112)
GFR calc Af Amer: >90 mL/min (ref >90)
Glucose, Bld: 178 mg/dL — ABNORMAL HIGH (ref 70–99)
Potassium: 4.3 mEq/L (ref 3.7–5.3)
SODIUM: 133 meq/L — AB (ref 137–147)

## 2014-01-29 LAB — SURGICAL PCR SCREEN
MRSA, PCR: NEGATIVE
Staphylococcus aureus: POSITIVE — AB

## 2014-01-29 NOTE — Patient Instructions (Signed)
[place].  01/29/2014   Your procedure is scheduled on:   02-04-2014  Report to [facility] at       0730 AM .  Call this number if you have problems the morning of surgery: [phone]  Or Presurgical Testing [phone](Elener Custodio) For Living Will and/or Health Care Power Attorney Forms: please provide copy for your medical record,may bring AM of surgery(Forms should be already notarized -we do not provide this service).  Remember: Follow any bowel prep instructions per MD office.    Do not eat food:After Midnight.    Take these medicines the morning of surgery with A SIP OF WATER: Baclofen. Valium. Hydrocodone(if needed).Tizanidine.  Tramadol. No insulin or Diabetic meds AM of.   Do not wear jewelry, make-up or nail polish.  Do not wear lotions, powders, or perfumes. You may wear deodorant.  Do not shave 48 hours(2 days) prior to first CHG shower(legs and under arms).(Shaving face and neck okay.)  Do not bring valuables to the hospital.(Hospital is not responsible for lost valuables).  Contacts, dentures or removable bridgework, body piercing, hair pins may not be worn into surgery.  Leave suitcase in the car. After surgery it may be brought to your room.  For patients admitted to the hospital, checkout time is 11:00 AM the day of discharge.(Restricted visitors-Any Persons displaying flu-like symptoms or illness).    Patients discharged the day of surgery will not be allowed to drive home. Must have responsible person with you x 24 hours once discharged.  Name and phone number of your driver:   Special Instructions: CHG(Chlorhedine 4%-"Hibiclens","Betasept","Aplicare") Shower Use Special Wash: see special instructions.(avoid face and genitals)   Please review fact sheets that you were given: MRSA Information.   Failure to follow these instructions may result in Cancellation of your surgery.   Patient  signature_______________________________________________________

## 2014-01-30 NOTE — Progress Notes (Signed)
01-30-14 1625 Pt. Notified of Positive Staph aureus -PCR screen, RX. For Mupirocin called to [facility] and note to you, that this was done.W. Floy Sabina

## 2014-01-30 NOTE — Pre-Procedure Instructions (Signed)
01-30-14 1625- Pt. Left voice message Positive PCR screen-Staph aureus- will require treatment with Mupirocin Ointment and RX. Called to [facility]- note to Dr. dhalstedt's office. Pt. 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 W. Floy Sabina

## 2014-02-03 NOTE — H&P (Signed)
Urology History and Physical Exam  CC: Neurogenic bladder  HPI: 53 year old male presents for placement of suprapubic tube. 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 Because of long-term catheter need, he presents at this time for suprapubic tube placement. He also has obvious bladder calculi on CT scan, and will have approximately 25 mm in diameter.his stones managed as well. These are  PMH: Past Medical History  Diagnosis Date  . 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 Diabetes mellitus without complication   . 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 Tumor of optic nerve     right -"stable"  . Headache(784.0)     neuropathic pain usually behind eyes related to blood pressure  . Transfusion history     '09 .     PSH: Past Surgical History  Procedure Laterality Date  . Back surgery      '91- T#-T5 "tumor resection"-benign  . Reconstruction breast w/ tram flap    . Muscle flap myocutaneous / fasciocutaneous of trunk Left     '01  . Craniotomy for tumor      medulla tumor "benign tumor"-salvage own bone for closure  . Peripherally inserted central catheter insertion      past hx.  . Enucleation Left     '05 with prosthesis  . Cervical spine surgery      '07- resection tumor C5-C7-"benign"  . Partial nephrectomy Right     '09- cancer  . Pancreas surgery      '09- with partial liver resection/ pancreas "[facility]"  . Cervical spine surgery      9'14 [facility] - meningioblastoma C1 resection    Allergies: Allergies  Allergen Reactions  . Ciprocin-Fluocin-Procin [Fluocinolone Acetonide] Rash    Pt states this happened with IV Cipro. ABLE TO TAKE PO CIPRO.    Medications: No prescriptions prior to admission     Social History: History   Social History  . Marital Status: Married    Spouse Name: N/A    Number of Children: N/A  . Years of Education: N/A    Occupational History  . Not on file.   Social History Main Topics  . Smoking status: Former Smoker -- 0.50 packs/day for 6 years    Types: Cigarettes    Start date: 01/15/1999    Quit date: 09/23/2008  . Smokeless tobacco: Never Used     Comment: quit 6 years ago  . Alcohol Use: Yes     Comment: only rare occasions  . Drug Use: No  . Sexual Activity: Not on file   Other Topics Concern  . Not on file   Social History Narrative  . No narrative on file    Family History: No family history on file.  Review of Systems: Positive: Long-term catheter placement with penile discomfort. Quadriparesis Negative:   A further 10 point review of systems was negative except what is listed in  the HPI.  Physical Exam: @VITALS2 @ General: No acute distress.  Awake. He is in a wheelchair, he has quite a parents this. Head:  Normocephalic.  Atraumatic. ENT:  EOMI.  Mucous membranes moist Neck:  Supple.  No lymphadenopathy. CV:  S1 present. S2 present. Regular rate. Pulmonary: Equal effort bilaterally.  Clear to auscultation bilaterally. Abdomen: Soft.  Non- tender to palpation. Skin:  Normal turgor.  No visible rash. Extremity: No gross deformity of bilateral upper extremities.  No gross  deformity of                             lower extremities. Neurologic: Alert. Appropriate mood.  Penis:  Circumcised.  No lesions. Urethra: Orthotopic meatus. Catheter in place Scrotum: No lesions.  No ecchymosis.  No erythema. Testicles: Descended bilaterally.  No masses bilaterally. Epididymis: Palpable bilaterally. Nontender to palpation.  Studies:  No results found for this basename: HGB, WBC, PLT,  in the last 72 hours  No results found for this basename: NA, K, CL, CO2, BUN, CREATININE, CALCIUM, MAGNESIUM, GFRNONAA, GFRAA,  in the last 72 hours   No results found for this basename: PT, INR, APTT,  in the last 72 hours   No components found with this basename: ABG,     Assessment:   Neurogenic bladder, bladder calculi  Plan: Cystoscopy, cystolitholapaxy, suprapubic tube placement

## 2014-02-04 ENCOUNTER — Encounter (HOSPITAL_COMMUNITY)
Admission: RE | Disposition: A | Discharge status: Home / Self Care | Payer: Self-pay | Source: Ambulatory Visit | Attending: Urology

## 2014-02-04 ENCOUNTER — Encounter (HOSPITAL_COMMUNITY): Payer: Self-pay | Admitting: Certified Registered Nurse Anesthetist

## 2014-02-04 ENCOUNTER — Observation Stay (HOSPITAL_COMMUNITY)
Admission: RE | Admit: 2014-02-04 | Discharge: 2014-02-04 | Disposition: A | Discharge status: Home / Self Care | Payer: 59 | Source: Ambulatory Visit | Attending: Urology | Admitting: Urology

## 2014-02-04 ENCOUNTER — Ambulatory Visit (HOSPITAL_COMMUNITY): Payer: 59 | Admitting: Anesthesiology

## 2014-02-04 ENCOUNTER — Encounter (HOSPITAL_COMMUNITY): Payer: 59 | Admitting: Anesthesiology

## 2014-02-04 DIAGNOSIS — G8250 Quadriplegia, unspecified: Secondary | ICD-10-CM | POA: Insufficient documentation

## 2014-02-04 DIAGNOSIS — D480 Neoplasm of uncertain behavior of bone and articular cartilage: Secondary | ICD-10-CM | POA: Diagnosis not present

## 2014-02-04 DIAGNOSIS — Z87891 Personal history of nicotine dependence: Secondary | ICD-10-CM | POA: Insufficient documentation

## 2014-02-04 DIAGNOSIS — E109 Type 1 diabetes mellitus without complications: Secondary | ICD-10-CM | POA: Diagnosis not present

## 2014-02-04 DIAGNOSIS — Q859 Phakomatosis, unspecified: Secondary | ICD-10-CM | POA: Diagnosis not present

## 2014-02-04 DIAGNOSIS — C787 Secondary malignant neoplasm of liver and intrahepatic bile duct: Secondary | ICD-10-CM | POA: Insufficient documentation

## 2014-02-04 DIAGNOSIS — N210 Calculus in bladder: Principal | ICD-10-CM | POA: Insufficient documentation

## 2014-02-04 DIAGNOSIS — N319 Neuromuscular dysfunction of bladder, unspecified: Secondary | ICD-10-CM | POA: Insufficient documentation

## 2014-02-04 HISTORY — PX: CYSTOSCOPY WITH LITHOLAPAXY: SHX1425

## 2014-02-04 HISTORY — PX: INSERTION OF SUPRAPUBIC CATHETER: SHX5870

## 2014-02-04 LAB — GLUCOSE, CAPILLARY
GLUCOSE-CAPILLARY: 188 mg/dL — AB (ref 70–99)
Glucose-Capillary: 185 mg/dL — ABNORMAL HIGH (ref 70–99)
Glucose-Capillary: 178 mg/dL — ABNORMAL HIGH (ref 70–99)

## 2014-02-04 SURGERY — INSERTION, SUPRAPUBIC CATHETER
Anesthesia: General

## 2014-02-04 MED ORDER — MORPHINE SULFATE 2 MG/ML IJ SOLN: 2.0000 mg | INTRAMUSCULAR | Status: DC | PRN

## 2014-02-04 MED ORDER — ONDANSETRON HCL 4 MG/2ML IJ SOLN
INTRAMUSCULAR | Status: DC | PRN
  Administered 2014-02-04 (×2): 2 mg via INTRAVENOUS

## 2014-02-04 MED ORDER — MIDAZOLAM HCL 5 MG/5ML IJ SOLN
INTRAMUSCULAR | Status: DC | PRN
  Administered 2014-02-04 (×2): .5 mg via INTRAVENOUS

## 2014-02-04 MED ORDER — LACTATED RINGERS IV SOLN: INTRAVENOUS | Status: DC

## 2014-02-04 MED ORDER — BACLOFEN 20 MG PO TABS: 20.0000 mg | ORAL_TABLET | Freq: Every day | ORAL | Status: DC

## 2014-02-04 MED ORDER — GENTAMICIN SULFATE 40 MG/ML IJ SOLN
380.0000 mg | Freq: Once | INTRAVENOUS | Status: DC
  Filled 2014-02-04: qty 9.5

## 2014-02-04 MED ORDER — BACLOFEN 20 MG PO TABS
40.0000 mg | ORAL_TABLET | Freq: Every day | ORAL | Status: DC
  Filled 2014-02-04: qty 2

## 2014-02-04 MED ORDER — BUPIVACAINE HCL (PF) 0.5 % IJ SOLN
INTRAMUSCULAR | Status: AC
  Filled 2014-02-04: qty 30

## 2014-02-04 MED ORDER — HYDROMORPHONE HCL PF 1 MG/ML IJ SOLN: 0.2500 mg | INTRAMUSCULAR | Status: DC | PRN

## 2014-02-04 MED ORDER — PROPOFOL 10 MG/ML IV BOLUS
INTRAVENOUS | Status: AC
  Filled 2014-02-04: qty 20

## 2014-02-04 MED ORDER — DOCUSATE SODIUM 100 MG PO CAPS
100.0000 mg | ORAL_CAPSULE | Freq: Two times a day (BID) | ORAL | Status: DC
  Filled 2014-02-04 (×2): qty 1

## 2014-02-04 MED ORDER — LIDOCAINE HCL (CARDIAC) 20 MG/ML IV SOLN
INTRAVENOUS | Status: DC | PRN
  Administered 2014-02-04: 75 mg via INTRAVENOUS

## 2014-02-04 MED ORDER — DIAZEPAM 5 MG PO TABS: 5.0000 mg | ORAL_TABLET | Freq: Four times a day (QID) | ORAL | Status: DC | PRN

## 2014-02-04 MED ORDER — DEXTROSE 5 % IV SOLN
500.0000 mg | INTRAVENOUS | Status: DC
  Administered 2014-02-04: 500 mg via INTRAVENOUS
  Filled 2014-02-04: qty 500

## 2014-02-04 MED ORDER — INSULIN ASPART 100 UNIT/ML ~~LOC~~ SOLN: 6.0000 [IU] | Freq: Three times a day (TID) | SUBCUTANEOUS | Status: DC

## 2014-02-04 MED ORDER — HYDROCODONE-ACETAMINOPHEN 5-325 MG PO TABS
1.0000 | ORAL_TABLET | ORAL | Status: DC | PRN
  Administered 2014-02-04: 1 via ORAL
  Filled 2014-02-04: qty 1

## 2014-02-04 MED ORDER — SODIUM CHLORIDE 0.9 % IR SOLN
Status: DC | PRN
  Administered 2014-02-04: 4000 mL via INTRAVESICAL

## 2014-02-04 MED ORDER — PROMETHAZINE HCL 25 MG PO TABS: 12.5000 mg | ORAL_TABLET | Freq: Four times a day (QID) | ORAL | Status: DC | PRN

## 2014-02-04 MED ORDER — MIDODRINE HCL 5 MG PO TABS: 10.0000 mg | ORAL_TABLET | Freq: Every morning | ORAL | Status: DC

## 2014-02-04 MED ORDER — TRAMADOL HCL 50 MG PO TABS
50.0000 mg | ORAL_TABLET | Freq: Three times a day (TID) | ORAL | Status: DC
  Administered 2014-02-04: 50 mg via ORAL
  Filled 2014-02-04: qty 1

## 2014-02-04 MED ORDER — INSULIN GLARGINE 100 UNIT/ML ~~LOC~~ SOLN
30.0000 [IU] | Freq: Every day | SUBCUTANEOUS | Status: DC
  Filled 2014-02-04: qty 0.3

## 2014-02-04 MED ORDER — SODIUM CHLORIDE 0.9 % IV SOLN
INTRAVENOUS | Status: DC
  Administered 2014-02-04: 16:00:00 via INTRAVENOUS

## 2014-02-04 MED ORDER — PROPOFOL 10 MG/ML IV BOLUS
INTRAVENOUS | Status: DC | PRN
  Administered 2014-02-04: 150 mg via INTRAVENOUS

## 2014-02-04 MED ORDER — PANCRELIPASE (LIP-PROT-AMYL) 12000-38000 UNITS PO CPEP
2.0000 | ORAL_CAPSULE | Freq: Three times a day (TID) | ORAL | Status: DC
  Administered 2014-02-04: 2 via ORAL
  Filled 2014-02-04 (×2): qty 2

## 2014-02-04 MED ORDER — BACLOFEN 20 MG PO TABS
20.0000 mg | ORAL_TABLET | Freq: Four times a day (QID) | ORAL | Status: DC
  Administered 2014-02-04 (×2): 20 mg via ORAL
  Filled 2014-02-04 (×4): qty 1

## 2014-02-04 MED ORDER — DEXTROSE 5 % IV SOLN
380.0000 mg | INTRAVENOUS | Status: AC
  Administered 2014-02-04: 380 mg via INTRAVENOUS
  Filled 2014-02-04: qty 9.5

## 2014-02-04 MED ORDER — LIDOCAINE HCL 2 % EX GEL
CUTANEOUS | Status: AC
  Filled 2014-02-04: qty 10

## 2014-02-04 MED ORDER — FENTANYL CITRATE 0.05 MG/ML IJ SOLN
INTRAMUSCULAR | Status: AC
  Filled 2014-02-04: qty 5

## 2014-02-04 MED ORDER — LACTATED RINGERS IV SOLN
INTRAVENOUS | Status: DC
  Administered 2014-02-04: 1000 mL via INTRAVENOUS

## 2014-02-04 MED ORDER — FENTANYL CITRATE 0.05 MG/ML IJ SOLN
INTRAMUSCULAR | Status: DC | PRN
  Administered 2014-02-04 (×2): 50 ug via INTRAVENOUS

## 2014-02-04 MED ORDER — LIDOCAINE HCL (CARDIAC) 20 MG/ML IV SOLN
INTRAVENOUS | Status: AC
  Filled 2014-02-04: qty 5

## 2014-02-04 MED ORDER — ONDANSETRON HCL 4 MG/2ML IJ SOLN
INTRAMUSCULAR | Status: AC
  Filled 2014-02-04: qty 2

## 2014-02-04 MED ORDER — MIDAZOLAM HCL 2 MG/2ML IJ SOLN
INTRAMUSCULAR | Status: AC
  Filled 2014-02-04: qty 2

## 2014-02-04 MED ORDER — PHENYLEPHRINE HCL 10 MG/ML IJ SOLN
INTRAMUSCULAR | Status: DC | PRN
  Administered 2014-02-04 (×3): 40 ug via INTRAVENOUS

## 2014-02-04 MED ORDER — CIPROFLOXACIN HCL 250 MG PO TABS: 250.0000 mg | ORAL_TABLET | Freq: Two times a day (BID) | ORAL | Status: DC

## 2014-02-04 MED ORDER — TIZANIDINE HCL 4 MG PO TABS
4.0000 mg | ORAL_TABLET | Freq: Four times a day (QID) | ORAL | Status: DC | PRN
  Filled 2014-02-04: qty 1

## 2014-02-04 MED ORDER — PROMETHAZINE HCL 25 MG/ML IJ SOLN: 6.2500 mg | INTRAMUSCULAR | Status: DC | PRN

## 2014-02-04 SURGICAL SUPPLY — 31 items
BAG URINE DRAINAGE (UROLOGICAL SUPPLIES) ×3 IMPLANT
BAG URINE LEG 500ML (DRAIN) ×1 IMPLANT
BAG URO CATCHER STRL LF (DRAPE) ×3 IMPLANT
BLADE SURG 15 STRL LF DISP TIS (BLADE) ×1 IMPLANT
BLADE SURG 15 STRL SS (BLADE) ×3
CATH FOLEY 2WAY SLVR 30CC 22FR (CATHETERS) ×2 IMPLANT
CLOTH BEACON ORANGE TIMEOUT ST (SAFETY) ×3 IMPLANT
COVER SURGICAL LIGHT HANDLE (MISCELLANEOUS) ×6 IMPLANT
DRAPE CAMERA CLOSED 9X96 (DRAPES) ×3 IMPLANT
ELECT REM PT RETURN 9FT ADLT (ELECTROSURGICAL) ×3
ELECTRODE REM PT RTRN 9FT ADLT (ELECTROSURGICAL) ×1 IMPLANT
GLOVE BIOGEL M 8.0 STRL (GLOVE) ×3 IMPLANT
GLOVE SURG SS PI 8.0 STRL IVOR (GLOVE) ×3 IMPLANT
GOWN STRL REUS W/ TWL XL LVL3 (GOWN DISPOSABLE) ×1 IMPLANT
GOWN STRL REUS W/TWL XL LVL3 (GOWN DISPOSABLE) ×9 IMPLANT
KIT SUPRAPUBIC CATH (MISCELLANEOUS) ×1 IMPLANT
MANIFOLD NEPTUNE II (INSTRUMENTS) ×3 IMPLANT
NEEDLE HYPO 22GX1.5 SAFETY (NEEDLE) ×2 IMPLANT
NS IRRIG 1000ML POUR BTL (IV SOLUTION) ×1 IMPLANT
PACK CYSTO (CUSTOM PROCEDURE TRAY) ×3 IMPLANT
PENCIL BUTTON HOLSTER BLD 10FT (ELECTRODE) ×2 IMPLANT
PLUG CATH AND CAP STER (CATHETERS) IMPLANT
SPONGE GAUZE 4X4 12PLY (GAUZE/BANDAGES/DRESSINGS) ×2 IMPLANT
SUT ETHILON 3 0 FSL (SUTURE) ×3 IMPLANT
SUT SILK 2 0 SH (SUTURE) ×2 IMPLANT
SYRINGE IRR TOOMEY STRL 70CC (SYRINGE) IMPLANT
TAPE CLOTH SURG 4X10 WHT LF (GAUZE/BANDAGES/DRESSINGS) ×2 IMPLANT
TOWEL OR 17X26 10 PK STRL BLUE (TOWEL DISPOSABLE) ×3 IMPLANT
TUBING CONNECTING 10 (TUBING) IMPLANT
TUBING CONNECTING 10' (TUBING)
WATER STERILE IRR 3000ML UROMA (IV SOLUTION) ×1 IMPLANT

## 2014-02-04 NOTE — Transfer of Care (Signed)
Immediate Anesthesia Transfer of Care Note  Patient: [name].  Procedure(s) Performed: Procedure(s): INSERTION OF SUPRAPUBIC CATHETER (N/A) CYSTOSCOPY WITH LITHOLAPAXY (N/A)  Patient Location: PACU  Anesthesia Type:General  Level of Consciousness: awake, alert , oriented and patient cooperative  Airway & Oxygen Therapy: Patient Spontanous Breathing and Patient connected to face mask oxygen  Post-op Assessment: Report given to PACU RN and Post -op Vital signs reviewed and stable  Post vital signs: Reviewed and stable  Complications: No apparent anesthesia complications

## 2014-02-04 NOTE — Preoperative (Signed)
Beta Blockers   Reason not to administer Beta Blockers:Not Applicable 

## 2014-02-04 NOTE — Discharge Instructions (Signed)
1. Drink plenty of fluids 2. Report fever or abdominal pain 3. Take cipro for 5 days

## 2014-02-04 NOTE — Op Note (Signed)
Preoperative diagnosis: Bladder calculi, neurogenic bladder  Postoperative diagnosis: Same   Procedure: Cystoscopy,litholapaxy 2 cm layering stones, placement of suprapubic tube    Surgeon: Lillette Boxer. Jaelene Garciagarcia, M.D.   Anesthesia: Gen.   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 The patient has an indwelling Foley catheter through his urethra. The patient, do to long-term need for catheterization secondary to his neurogenic bladder, as requested suprapubic tube placement. I discussed the procedure, as well as risks and complications with the patient and his wife on 2 occasions. He has been pretreated with Cipro for the past 4 days. He presents for that procedure.    Technique and findings: The patient was properly identified in the holding area. He was taken the operating room where general anesthetic was administered with the LMA. He was placed in the dorsolithotomy position. Genitalia and perineum as well as lower abdomen were prepped and draped. He received preoperative IV gentamicin. Proper timeout was performed.  I then passed a 22 [place] panendoscope under direct vision through his urethra which was normal, without lesions. Bladder was inspected circumferentially. There were no tumors or trabeculations. Ureteral orifices were normal in configuration and location. There were multiple stones layering posteriorly on the bladder, approximately 2 cm in aggregate diameter. It copiously irrigated the bladder, and removed all of the stones without having to fragment them. After inspection revealed no further stones, until the bladder with approximately 350-400 cc of saline. I passed a Lowsley retractor through the urethra, and angled in such that the tip was pressing against the  posterior rectus fascia, palpable to the abdomen. A cutdown over this with the electrocautery, passing the retractor through the skin layer. I then grasped the tip of a 56 French Foley catheter, and brought it into the bladder. 15 cc of water was placed in the balloon. Cystoscopic view revealed adequate positioning of the catheter in the balloon anteriorly in the bladder. I then sutured the catheter to the patient's abdominal wall with a 2-0 silk. There was one small suture placed just inside the exit site of the catheter is well, reapproximating the wound.  At this point, the catheter was hooked to dependent drainage. A dry sterile dressing was placed. Stones will be sent for analysis. The patient tolerated procedure well. He was returned to the PACU in stable condition.

## 2014-02-04 NOTE — Progress Notes (Addendum)
Pharmacy: Gentamicin - Brief note  Pharmacy asked to dose gentamicin post-operatively for UTI.  Patient underwent urology procedure (Suprapubic tube placement) on 3/2 and received gentamicin pre-op.  I will give additional 5 mg/kg tomorrow 24 hours after procedure and f/u if patient has true UTI or dosing for prophylaxis.    1.) Gentamicin 380 mg IV x 1 tomorrow at 1000 2.) f/u Urine cultures and/or length of therapy with gentamicin   Tabita Corbo, Gaye Alken PharmD Pager #: [phone] 1:41 PM 02/04/2014

## 2014-02-04 NOTE — Anesthesia Procedure Notes (Signed)
Procedure Name: LMA Insertion Date/Time: 02/04/2014 10:25 AM Performed by: Ofilia Neas Pre-anesthesia Checklist: Patient identified, Patient being monitored, Timeout performed, Emergency Drugs available and Suction available Patient Re-evaluated:Patient Re-evaluated prior to inductionOxygen Delivery Method: Circle system utilized Preoxygenation: Pre-oxygenation with 100% oxygen Intubation Type: IV induction Ventilation: Mask ventilation without difficulty LMA: LMA inserted LMA Size: 4.0 Number of attempts: 1 Placement Confirmation: positive ETCO2 Tube secured with: Tape Dental Injury: Teeth and Oropharynx as per pre-operative assessment

## 2014-02-04 NOTE — Anesthesia Preprocedure Evaluation (Signed)
Anesthesia Evaluation  Patient identified by MRN, date of birth, ID band Patient awake    Reviewed: Allergy & Precautions, H&P , NPO status , Patient's Chart, lab work & pertinent test results  Airway Mallampati: II TM Distance: >3 FB Neck ROM: Full    Dental  (+) Teeth Intact, Dental Advisory Given   Pulmonary neg pulmonary ROS, former smoker,  breath sounds clear to auscultation  Pulmonary exam normal       Cardiovascular negative cardio ROS  Rhythm:Regular Rate:Normal     Neuro/Psych Von Hippel- Lindau syndrome; T4 paraplegic post-surgical Numerous procedures for VHL related problems. negative neurological ROS  negative psych ROS   GI/Hepatic negative GI ROS, Neg liver ROS,   Endo/Other  diabetes, Type 1, Insulin Dependent  Renal/GU negative Renal ROS Bladder dysfunction   negative genitourinary   Musculoskeletal negative musculoskeletal ROS (+)   Abdominal   Peds  Hematology negative hematology ROS (+)   Anesthesia Other Findings   Reproductive/Obstetrics                           Anesthesia Physical Anesthesia Plan  ASA: III  Anesthesia Plan: General   Post-op Pain Management:    Induction: Intravenous  Airway Management Planned: LMA  Additional Equipment:   Intra-op Plan:   Post-operative Plan: Extubation in OR  Informed Consent: I have reviewed the patients History and Physical, chart, labs and discussed the procedure including the risks, benefits and alternatives for the proposed anesthesia with the patient or authorized representative who has indicated his/her understanding and acceptance.   Dental advisory given  Plan Discussed with: CRNA  Anesthesia Plan Comments:         Anesthesia Quick Evaluation

## 2014-02-05 ENCOUNTER — Encounter (HOSPITAL_COMMUNITY): Payer: Self-pay | Admitting: Urology

## 2014-02-06 NOTE — Anesthesia Postprocedure Evaluation (Signed)
Anesthesia Post Note  Patient: [name].  Procedure(s) Performed: Procedure(s) (LRB): INSERTION OF SUPRAPUBIC CATHETER (N/A) CYSTOSCOPY WITH LITHOLAPAXY (N/A)  Anesthesia type: General  Patient location: PACU  Post pain: Pain level controlled  Post assessment: Post-op Vital signs reviewed  Last Vitals:  Filed Vitals:   02/04/14 1200  BP: 138/83  Pulse: 61  Temp: 36.3 C  Resp: 16    Post vital signs: Reviewed  Level of consciousness: sedated  Complications: No apparent anesthesia complications

## 2014-02-11 NOTE — Discharge Summary (Signed)
Patient ID: [name]. MRN: [id number] DOB/AGE: 03-04-1961 53 y.o.  Admit date: 02/04/2014 Discharge date: 02/11/2014  Primary Care Physician:  Baruch Goldmann, PA-C  Discharge Diagnoses:   Present on Admission:  . Neurogenic bladder  Consults:  None   Discharge Medications:   Medication List         baclofen 20 MG tablet  Commonly known as:  LIORESAL  Take 20-40 mg by mouth 5 (five) times daily. TAKES 1 TABLET  4 TIMES DAILY AND 2 AT BEDTIME     ciprofloxacin 250 MG tablet  Commonly known as:  CIPRO  Take 1 tablet (250 mg total) by mouth 2 (two) times daily.     diazepam 5 MG tablet  Commonly known as:  VALIUM  Take 5 mg by mouth every 6 (six) hours as needed for anxiety.     HYDROcodone-acetaminophen 5-500 MG per tablet  Commonly known as:  VICODIN  Take 0.5-1 tablets by mouth every 6 (six) hours as needed for pain.     insulin glargine 100 UNIT/ML injection  Commonly known as:  LANTUS  Inject 30 Units into the skin daily before breakfast.     insulin lispro 100 UNIT/ML injection  Commonly known as:  HUMALOG  Inject 3-9 Units into the skin 3 (three) times daily before meals. SS     midodrine 5 MG tablet  Commonly known as:  PROAMATINE  Take 10 mg by mouth every morning.     Pancrelipase (Lip-Prot-Amyl) 24000 UNITS Cpep  Take 3 capsules by mouth 3 (three) times daily with meals.     promethazine 12.5 MG tablet  Commonly known as:  PHENERGAN  Take 12.5 mg by mouth every 6 (six) hours as needed for nausea.     tiZANidine 4 MG tablet  Commonly known as:  ZANAFLEX  Take 4 mg by mouth every 6 (six) hours as needed for muscle spasms.     traMADol 50 MG tablet  Commonly known as:  ULTRAM  Take 50 mg by mouth 3 (three) times daily.         Significant Diagnostic Studies:  No results found.  Brief H and P: For complete details please refer to admission H and P, but in brief the patient is admitted for suprapubic tube placement  cystolitholapaxy  Hospital Course:  The patient was admitted for observation following his suprapubic tube placement. He did well, and was discharged the evening of his surgery.  Day of Discharge BP 138/83  Pulse 61  Temp(Src) 97.4 F (36.3 C)  Resp 16  Ht 5\' 9"  (1.753 m)  Wt 68.04 kg (150 lb)  BMI 22.14 kg/m2  SpO2 100%  No results found for this or any previous visit (from the past 24 hour(s)).  Physical Exam: General: Alert and awake oriented x3 not in any acute distress. HEENT: anicteric sclera, pupils reactive to light and accommodation CVS: S1-S2 clear no murmur rubs or gallops Chest: clear to auscultation bilaterally, no wheezing rales or rhonchi Abdomen: soft nontender, nondistended, normal bowel sounds, no organomegaly Extremities: no cyanosis, clubbing or edema noted bilaterally Neuro: Cranial nerves II-XII intact, no focal neurological deficits  Disposition:  Home  Diet:  No restrictions  Activity:  Discussed with patient   Disposition and Follow-up:     Discharge Orders   Future Appointments Provider Department Dept Phone   02/12/2014 11:00 AM [place] [phone]   02/12/2014 11:15 AM Volanda Napoleon, MD [place]  [place] [phone]   02/12/2014 11:45 AM [facility] [place] [phone]   03/13/2014 8:45 AM Gwend[facility]yn [place] [phone]   03/13/2014 9:15 AM Volanda Napoleon, [place] [phone]   03/13/2014 10:15 AM [facility] [place] [phone]   04/09/2014 9:00 AM Gwendolyn [place] [phone]   04/09/2014 9:30 AM Volanda Napoleon, MD [place] [phone]   04/09/2014 10:30 AM [place] [phone]   Future Orders Complete  By Expires   Discharge patient  As directed    Comments:     After rocephin given         TESTS THAT NEED FOLLOW-UP  None  DISCHARGE FOLLOW-UP Follow-up Information   Follow up with Jorja Loa, MD.   Specialty:  Urology   Contact information:   [facility]  [place] [address] [phone]       Time spent on Discharge:  15 minutes  Signed: Jorja Loa 02/11/2014, 1:04 PM

## 2014-02-12 ENCOUNTER — Ambulatory Visit: Payer: 59

## 2014-02-12 ENCOUNTER — Other Ambulatory Visit (HOSPITAL_BASED_OUTPATIENT_CLINIC_OR_DEPARTMENT_OTHER): Payer: 59 | Admitting: Lab

## 2014-02-12 ENCOUNTER — Ambulatory Visit (HOSPITAL_BASED_OUTPATIENT_CLINIC_OR_DEPARTMENT_OTHER): Payer: 59

## 2014-02-12 ENCOUNTER — Other Ambulatory Visit: Payer: 59 | Admitting: Lab

## 2014-02-12 ENCOUNTER — Ambulatory Visit (HOSPITAL_BASED_OUTPATIENT_CLINIC_OR_DEPARTMENT_OTHER): Payer: 59 | Admitting: Hematology & Oncology

## 2014-02-12 ENCOUNTER — Ambulatory Visit: Payer: 59 | Admitting: Hematology & Oncology

## 2014-02-12 VITALS — BP 135/85 | HR 80 | Temp 97.1°F | Resp 18

## 2014-02-12 DIAGNOSIS — C7A8 Other malignant neuroendocrine tumors: Secondary | ICD-10-CM

## 2014-02-12 DIAGNOSIS — Q859 Phakomatosis, unspecified: Secondary | ICD-10-CM | POA: Diagnosis not present

## 2014-02-12 DIAGNOSIS — C787 Secondary malignant neoplasm of liver and intrahepatic bile duct: Secondary | ICD-10-CM

## 2014-02-12 DIAGNOSIS — C7A00 Malignant carcinoid tumor of unspecified site: Secondary | ICD-10-CM

## 2014-02-12 LAB — CBC WITH DIFFERENTIAL (CANCER CENTER ONLY)
BASO#: 0 10*3/uL (ref 0.0–0.2)
BASO%: 0.5 % (ref 0.0–2.0)
EOS ABS: 0.1 10*3/uL (ref 0.0–0.5)
EOS%: 1.2 % (ref 0.0–7.0)
HCT: 42.6 % (ref 38.7–49.9)
HGB: 13.3 g/dL (ref 13.0–17.1)
LYMPH#: 1 10*3/uL (ref 0.9–3.3)
LYMPH%: 17.4 % (ref 14.0–48.0)
MCH: 25.1 pg — ABNORMAL LOW (ref 28.0–33.4)
MCHC: 31.2 g/dL — ABNORMAL LOW (ref 32.0–35.9)
MCV: 80 fL — ABNORMAL LOW (ref 82–98)
MONO#: 0.6 10*3/uL (ref 0.1–0.9)
MONO%: 10.2 % (ref 0.0–13.0)
NEUT#: 4.1 10*3/uL (ref 1.5–6.5)
NEUT%: 70.7 % (ref 40.0–80.0)
PLATELETS: 231 10*3/uL (ref 145–400)
RBC: 5.3 10*6/uL (ref 4.20–5.70)
RDW: 13.9 % (ref 11.1–15.7)
WBC: 5.8 10*3/uL (ref 4.0–10.0)

## 2014-02-12 LAB — CMP (CANCER CENTER ONLY)
ALK PHOS: 173 U/L — AB (ref 26–84)
ALT(SGPT): 32 U/L (ref 10–47)
AST: 49 U/L — AB (ref 11–38)
Albumin: 3 g/dL — ABNORMAL LOW (ref 3.3–5.5)
BUN, Bld: 14 mg/dL (ref 7–22)
CALCIUM: 8.5 mg/dL (ref 8.0–10.3)
CHLORIDE: 95 meq/L — AB (ref 98–108)
CO2: 33 mEq/L (ref 18–33)
CREATININE: 0.8 mg/dL (ref 0.6–1.2)
Glucose, Bld: 209 mg/dL — ABNORMAL HIGH (ref 73–118)
Potassium: 3.5 mEq/L (ref 3.3–4.7)
Sodium: 137 mEq/L (ref 128–145)
Total Bilirubin: 0.6 mg/dl (ref 0.20–1.60)
Total Protein: 7.4 g/dL (ref 6.4–8.1)

## 2014-02-12 MED ORDER — HYDROCODONE-ACETAMINOPHEN 5-500 MG PO TABS: 0.5000 | ORAL_TABLET | Freq: Four times a day (QID) | ORAL | Status: DC | PRN

## 2014-02-12 MED ORDER — OCTREOTIDE ACETATE 30 MG IM KIT
PACK | INTRAMUSCULAR | Status: AC
  Filled 2014-02-12: qty 1

## 2014-02-12 MED ORDER — BACLOFEN 20 MG PO TABS: 20.0000 mg | ORAL_TABLET | Freq: Every day | ORAL | Status: DC

## 2014-02-12 MED ORDER — OCTREOTIDE ACETATE 30 MG IM KIT
30.0000 mg | PACK | Freq: Once | INTRAMUSCULAR | Status: AC
  Administered 2014-02-12: 30 mg via INTRAMUSCULAR

## 2014-02-12 NOTE — Patient Instructions (Signed)

## 2014-02-12 NOTE — Progress Notes (Signed)
  DIAGNOSIS:  Metastatic neuroendocrine carcinoma  Von Hipple-Lindau Syndrome   CURRENT THERAPY:  Sandostatin 30 mg IM every month   INTERIM HISTORY:  [name] comes in for followup. He had a suprapubic catheter put into his bladder a week ago Monday. This ultimately fell will help him out much more than recurrent catheterizations. He is paraplegic. He had no problems with the procedure. He is a little sore in the pelvis region    He's had no nausea vomiting. He's had no rashes. He's had no wheezing. There is no fever. He's had no diarrhea. Does have abdominal spasms. These are infrequent.    His last chromogranin A level was 22.   PHYSICAL EXAMINATION: Well-nourished gentleman. He comes in his motorized wheelchair. He is alert and oriented. Vital signs show temperature of 97.1. Pulse 80. Blood pressure 135/85. Head and neck exam shows no ocular or oral lesions. Has no palpable cervical or supraclavicular lymph nodes. Lungs are clear a lot. No wheezes are noted. Cardiac exam regular in rhythm. Abdomen soft. He does have a surgical dressing on his lower abdomen. There is some tenderness to palpation in the lower abdomen. No erythema noted on the skin. Bowel sounds are present. Back exam shows a cervical laminectomy scar. Is well-healed. Extremities shows no movement with his legs. No swelling is noted.  LABORATORY STUDIES: White cell count is 5.8. Hemoglobin 13.3. Platelet count 231. I his alkaline phosphatase is 173. Albumin is 3.0. Other liver function tests are normal.     IMPRESSION: Mr. Wahlert is a 53 year old gentleman. He has von Hipple-Lindau disorder. He has multiple neuroendocrine cancers. He's had multiple surgeries for a neuro blastomas. He's had intrahepatic chemotherapy for progressive hepatic disease which is worked.  We will have to see what his alkaline phosphatase does. If it continues to increase, then we may have to institute systemic chemotherapy and is  not had systemic chemotherapy for good 2 years.  I will see him back in one month. We will plan for another scan in 2 months if all continues to go well.  Volanda Napoleon, MD 02/12/2014

## 2014-02-18 LAB — LACTATE DEHYDROGENASE: LDH: 152 U/L (ref 94–250)

## 2014-02-18 LAB — CHROMOGRANIN A: Chromogranin A: 3.8 ng/mL (ref 1.9–15.0)

## 2014-03-13 ENCOUNTER — Ambulatory Visit (HOSPITAL_BASED_OUTPATIENT_CLINIC_OR_DEPARTMENT_OTHER): Payer: 59 | Admitting: Hematology & Oncology

## 2014-03-13 ENCOUNTER — Other Ambulatory Visit (HOSPITAL_BASED_OUTPATIENT_CLINIC_OR_DEPARTMENT_OTHER): Payer: 59 | Admitting: Lab

## 2014-03-13 ENCOUNTER — Ambulatory Visit (HOSPITAL_BASED_OUTPATIENT_CLINIC_OR_DEPARTMENT_OTHER): Payer: 59

## 2014-03-13 ENCOUNTER — Other Ambulatory Visit: Payer: Self-pay | Admitting: Nurse Practitioner

## 2014-03-13 ENCOUNTER — Encounter: Payer: Self-pay | Admitting: Hematology & Oncology

## 2014-03-13 VITALS — BP 125/82 | HR 70 | Temp 97.5°F | Resp 18

## 2014-03-13 DIAGNOSIS — C787 Secondary malignant neoplasm of liver and intrahepatic bile duct: Secondary | ICD-10-CM

## 2014-03-13 DIAGNOSIS — C7A8 Other malignant neuroendocrine tumors: Secondary | ICD-10-CM

## 2014-03-13 DIAGNOSIS — E340 Carcinoid syndrome: Secondary | ICD-10-CM

## 2014-03-13 DIAGNOSIS — C7A00 Malignant carcinoid tumor of unspecified site: Secondary | ICD-10-CM

## 2014-03-13 LAB — CBC WITH DIFFERENTIAL (CANCER CENTER ONLY)
BASO#: 0 10*3/uL (ref 0.0–0.2)
BASO%: 0.5 % (ref 0.0–2.0)
EOS%: 2.6 % (ref 0.0–7.0)
Eosinophils Absolute: 0.2 10*3/uL (ref 0.0–0.5)
HEMATOCRIT: 43.2 % (ref 38.7–49.9)
HGB: 13.7 g/dL (ref 13.0–17.1)
LYMPH#: 1.2 10*3/uL (ref 0.9–3.3)
LYMPH%: 19.3 % (ref 14.0–48.0)
MCH: 25.2 pg — ABNORMAL LOW (ref 28.0–33.4)
MCHC: 31.7 g/dL — AB (ref 32.0–35.9)
MCV: 80 fL — ABNORMAL LOW (ref 82–98)
MONO#: 0.6 10*3/uL (ref 0.1–0.9)
MONO%: 9 % (ref 0.0–13.0)
NEUT#: 4.2 10*3/uL (ref 1.5–6.5)
NEUT%: 68.6 % (ref 40.0–80.0)
Platelets: 256 10*3/uL (ref 145–400)
RBC: 5.43 10*6/uL (ref 4.20–5.70)
RDW: 14.2 % (ref 11.1–15.7)
WBC: 6.1 10*3/uL (ref 4.0–10.0)

## 2014-03-13 LAB — CMP (CANCER CENTER ONLY)
ALT(SGPT): 24 U/L (ref 10–47)
AST: 40 U/L — ABNORMAL HIGH (ref 11–38)
Albumin: 2.8 g/dL — ABNORMAL LOW (ref 3.3–5.5)
Alkaline Phosphatase: 147 U/L — ABNORMAL HIGH (ref 26–84)
BILIRUBIN TOTAL: 0.6 mg/dL (ref 0.20–1.60)
BUN: 14 mg/dL (ref 7–22)
CO2: 32 meq/L (ref 18–33)
CREATININE: 0.7 mg/dL (ref 0.6–1.2)
Calcium: 8.5 mg/dL (ref 8.0–10.3)
Chloride: 99 mEq/L (ref 98–108)
GLUCOSE: 204 mg/dL — AB (ref 73–118)
Potassium: 3.4 mEq/L (ref 3.3–4.7)
Sodium: 137 mEq/L (ref 128–145)
Total Protein: 7.6 g/dL (ref 6.4–8.1)

## 2014-03-13 MED ORDER — OCTREOTIDE ACETATE 30 MG IM KIT
30.0000 mg | PACK | Freq: Once | INTRAMUSCULAR | Status: AC
Start: 2014-03-13 — End: 2014-03-13
  Administered 2014-03-13: 30 mg via INTRAMUSCULAR

## 2014-03-13 MED ORDER — OCTREOTIDE ACETATE 30 MG IM KIT
PACK | INTRAMUSCULAR | Status: AC
  Filled 2014-03-13: qty 1

## 2014-03-13 NOTE — Patient Instructions (Signed)

## 2014-03-13 NOTE — Progress Notes (Signed)
Hematology and Oncology Follow Up Visit  [name] [id number] [birth year] 53 y.o. 03/13/2014   Principle Diagnosis:   Metastatic neuroendocrine carcinoma  Von Hipple-Lindau Syndrome  Current Therapy:    Sandostatin 30 mg IM every month     Interim History:  [name] is back for followup. Last week, and he is a by the [facility]. He was seen by the neurosurgeons at they're following his neck surgery. He is doing well. He had scans done. 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 There may have been a slight growth with a cerebellar one.  His suprapubic catheter is doing well. He is going next week to have the catheter changed.  There's been no problems with cough or shortness of breath.  Has been doing pretty good.  His blood sugars have been okay.  There's been some sweats at nighttime.  His last chromogranin A level was 3.8.  He is having no issues with his appetite. Again, there is no nausea vomiting. She's not having any problems with diarrhea.  Medications: Current outpatient prescriptions:baclofen (LIORESAL) 20 MG tablet, Take 1-2 tablets (20-40 mg total) by mouth 5 (five) times daily. TAKES 1 TABLET  4 TIMES DAILY AND 2 AT BEDTIME, Disp: 120 each, Rfl: 12;  diazepam (VALIUM) 5 MG tablet, Take 5 mg by mouth every 6 (six) hours as needed for anxiety., Disp: , Rfl:  HYDROcodone-acetaminophen (VICODIN) 5-500 MG per tablet, Take 0.5-1 tablets by mouth every 6 (six) hours as needed for pain., Disp: 120 tablet, Rfl: 0;  insulin glargine (LANTUS) 100 UNIT/ML injection, Inject 30 Units into the skin daily before breakfast. , Disp: , Rfl: ;  insulin lispro (HUMALOG) 100 UNIT/ML injection, Inject 3-9 Units into the skin 3 (three) times daily before meals. SS, Disp: , Rfl:  midodrine (PROAMATINE) 5 MG tablet, Take 10 mg by mouth every morning. , Disp: , Rfl: ;  Pancrelipase, Lip-Prot-Amyl, 24000 UNITS CPEP, Take 3 capsules by mouth 3 (three) times daily with  meals., Disp: , Rfl: ;  promethazine (PHENERGAN) 12.5 MG tablet, Take 12.5 mg by mouth every 6 (six) hours as needed for nausea. , Disp: , Rfl: ;  tiZANidine (ZANAFLEX) 4 MG tablet, Take 4 mg by mouth every 6 (six) hours as needed for muscle spasms., Disp: , Rfl:  traMADol (ULTRAM) 50 MG tablet, Take 50 mg by mouth 3 (three) times daily., Disp: , Rfl:   Allergies:  Allergies  Allergen Reactions  . Ciprocin-Fluocin-Procin [Fluocinolone Acetonide] Rash    Pt states this happened with IV Cipro. ABLE TO TAKE PO CIPRO.    Past Medical History, Surgical history, Social history, and Family History were reviewed and updated.  Review of Systems: As above  Physical Exam:  oral temperature is 97.5 F (36.4 C). His blood pressure is 125/82 and his pulse is 70. His respiration is 18.   Well-developed well-nourished gentleman. Head and neck exam shows no ocular or oral lesions. There is no scleral icterus. Neck is supple with no adenopathy. He has the cervical spine surgical scar in the back of his neck. This is well-healed. Lungs are clear. Cardiac exam regular rate and rhythm with no murmurs rubs or bruits. Abdomen is soft. He has multiple laparotomy scars. Abdomen is soft. Bowel sounds are present. No palpable fluid wave. No palpable abdominal mass. There is no palpable liver or spleen tip. Extremities shows paraplegia in the legs. The swelling is noted. Skin exam no rashes. Neurological exam shows the paraplegia  which is chronic.  Lab Results  Component Value Date   WBC 6.1 03/13/2014   HGB 13.7 03/13/2014   HCT 43.2 03/13/2014   MCV 80* 03/13/2014   PLT 256 03/13/2014     Chemistry      Component Value Date/Time   NA 137 03/13/2014 0906   NA 133* 01/29/2014 1555   K 3.4 03/13/2014 0906   K 4.3 01/29/2014 1555   CL 99 03/13/2014 0906   CL 91* 01/29/2014 1555   CO2 32 03/13/2014 0906   CO2 26 01/29/2014 1555   BUN 14 03/13/2014 0906   BUN 20 01/29/2014 1555   CREATININE 0.7 03/13/2014 0906   CREATININE 0.80  01/29/2014 1555      Component Value Date/Time   CALCIUM 8.5 03/13/2014 0906   CALCIUM 9.4 01/29/2014 1555   ALKPHOS 147* 03/13/2014 0906   ALKPHOS 245* 04/10/2013 0927   AST 40* 03/13/2014 0906   AST 33 04/10/2013 0927   ALT 24 03/13/2014 0906   ALT 32 04/10/2013 0927   BILITOT 0.60 03/13/2014 0906   BILITOT 0.7 04/10/2013 0927         Impression and Plan: [name] is 53 year old gentleman with metastatic neuroendocrine carcinomas. These are low grade.  Has had intrahepatic therapy. This is helped quite a bit.  I do not see any indication for systemic therapy right now. He had been on Temodar and Xeloda. This did help. Since his disease is stable right now, we can just follow him along.  Sandostatin does help him quite a bit.  We'll go ahead and plan for another set of scans in May before I see him back.  As always, I spent a good half hour with he and his wife. They're both very nice. His wife is a therapist at the hospital.   Scott Napoleon, MD 4/8/201510:04 AM

## 2014-03-15 ENCOUNTER — Telehealth: Payer: Self-pay | Admitting: Hematology & Oncology

## 2014-03-15 NOTE — Telephone Encounter (Signed)
Pt aware of 5-7 CT to drink at 11 am ,12 pm and to be NPO 4 hrs prior and that he has MD appointment at 3 pm

## 2014-03-20 DIAGNOSIS — N319 Neuromuscular dysfunction of bladder, unspecified: Secondary | ICD-10-CM | POA: Diagnosis not present

## 2014-03-20 LAB — LACTATE DEHYDROGENASE: LDH: 136 U/L (ref 94–250)

## 2014-03-20 LAB — CHROMOGRANIN A: Chromogranin A: 5.2 ng/mL (ref 1.9–15.0)

## 2014-04-08 DIAGNOSIS — E109 Type 1 diabetes mellitus without complications: Secondary | ICD-10-CM | POA: Diagnosis not present

## 2014-04-09 ENCOUNTER — Other Ambulatory Visit: Payer: 59 | Admitting: Lab

## 2014-04-09 ENCOUNTER — Ambulatory Visit: Payer: 59

## 2014-04-09 ENCOUNTER — Ambulatory Visit: Payer: 59 | Admitting: Hematology & Oncology

## 2014-04-11 ENCOUNTER — Encounter: Payer: Self-pay | Admitting: Hematology & Oncology

## 2014-04-11 ENCOUNTER — Ambulatory Visit (HOSPITAL_BASED_OUTPATIENT_CLINIC_OR_DEPARTMENT_OTHER): Payer: 59

## 2014-04-11 ENCOUNTER — Encounter (HOSPITAL_BASED_OUTPATIENT_CLINIC_OR_DEPARTMENT_OTHER): Payer: Self-pay

## 2014-04-11 ENCOUNTER — Ambulatory Visit (HOSPITAL_BASED_OUTPATIENT_CLINIC_OR_DEPARTMENT_OTHER): Payer: 59 | Admitting: Hematology & Oncology

## 2014-04-11 ENCOUNTER — Ambulatory Visit (HOSPITAL_BASED_OUTPATIENT_CLINIC_OR_DEPARTMENT_OTHER)
Admission: RE | Admit: 2014-04-11 | Discharge: 2014-04-11 | Disposition: A | Discharge status: Home / Self Care | Payer: 59 | Source: Ambulatory Visit | Attending: Hematology & Oncology | Admitting: Hematology & Oncology

## 2014-04-11 ENCOUNTER — Other Ambulatory Visit (HOSPITAL_BASED_OUTPATIENT_CLINIC_OR_DEPARTMENT_OTHER): Payer: 59 | Admitting: Lab

## 2014-04-11 VITALS — BP 113/70 | HR 78 | Temp 98.2°F | Resp 16 | Ht 69.0 in

## 2014-04-11 DIAGNOSIS — C787 Secondary malignant neoplasm of liver and intrahepatic bile duct: Secondary | ICD-10-CM

## 2014-04-11 DIAGNOSIS — D7389 Other diseases of spleen: Secondary | ICD-10-CM | POA: Diagnosis not present

## 2014-04-11 DIAGNOSIS — C7A00 Malignant carcinoid tumor of unspecified site: Secondary | ICD-10-CM | POA: Insufficient documentation

## 2014-04-11 DIAGNOSIS — C7A8 Other malignant neuroendocrine tumors: Secondary | ICD-10-CM

## 2014-04-11 DIAGNOSIS — M949 Disorder of cartilage, unspecified: Secondary | ICD-10-CM

## 2014-04-11 DIAGNOSIS — N289 Disorder of kidney and ureter, unspecified: Secondary | ICD-10-CM | POA: Insufficient documentation

## 2014-04-11 DIAGNOSIS — M899 Disorder of bone, unspecified: Secondary | ICD-10-CM | POA: Insufficient documentation

## 2014-04-11 DIAGNOSIS — C801 Malignant (primary) neoplasm, unspecified: Secondary | ICD-10-CM | POA: Diagnosis not present

## 2014-04-11 LAB — CBC WITH DIFFERENTIAL (CANCER CENTER ONLY)
BASO#: 0 10*3/uL (ref 0.0–0.2)
BASO%: 0.5 % (ref 0.0–2.0)
EOS ABS: 0.2 10*3/uL (ref 0.0–0.5)
EOS%: 2.4 % (ref 0.0–7.0)
HEMATOCRIT: 42.3 % (ref 38.7–49.9)
HEMOGLOBIN: 13.6 g/dL (ref 13.0–17.1)
LYMPH#: 1.8 10*3/uL (ref 0.9–3.3)
LYMPH%: 28.1 % (ref 14.0–48.0)
MCH: 25.6 pg — AB (ref 28.0–33.4)
MCHC: 32.2 g/dL (ref 32.0–35.9)
MCV: 80 fL — AB (ref 82–98)
MONO#: 0.8 10*3/uL (ref 0.1–0.9)
MONO%: 12 % (ref 0.0–13.0)
NEUT#: 3.6 10*3/uL (ref 1.5–6.5)
NEUT%: 57 % (ref 40.0–80.0)
Platelets: 208 10*3/uL (ref 145–400)
RBC: 5.32 10*6/uL (ref 4.20–5.70)
RDW: 14.8 % (ref 11.1–15.7)
WBC: 6.3 10*3/uL (ref 4.0–10.0)

## 2014-04-11 MED ORDER — OCTREOTIDE ACETATE 30 MG IM KIT
30.0000 mg | PACK | Freq: Once | INTRAMUSCULAR | Status: AC
  Administered 2014-04-11: 30 mg via INTRAMUSCULAR

## 2014-04-11 MED ORDER — OCTREOTIDE ACETATE 30 MG IM KIT
PACK | INTRAMUSCULAR | Status: AC
  Filled 2014-04-11: qty 1

## 2014-04-11 MED ORDER — IOHEXOL 300 MG/ML  SOLN
100.0000 mL | Freq: Once | INTRAMUSCULAR | Status: AC | PRN
  Administered 2014-04-11: 100 mL via INTRAVENOUS

## 2014-04-11 NOTE — Progress Notes (Signed)
Hematology and Oncology Follow Up Visit  [name] [id number] Sep 09, 1961 53 y.o. 04/11/2014   Principle Diagnosis:   Metastatic neuroendocrine carcinoma  Von Hipple-Lindau Syndrome  Current Therapy:   Sandostatin 30 mg IM every month     Interim History:  [name] is back for followup. He had his CT scan done today. It does show that his disease is stable. He has stable hepatic metastases. He has stable renal lesions. There is no lymphadenopathy.  He feels pretty well. He has a new suprapubic catheter in. This is working fairly well. Is much better than him having to self catheterize himself.  He's had no cough. No shortness of breath. His blood sugars do fluctuate.  He is paraplegic. He is getting around in his wheelchair. He is incredibly functional. His appetite has been pretty good.  His last chromogranin A was 5.2.   Medications: Current outpatient prescriptions:baclofen (LIORESAL) 20 MG tablet, Take 1-2 tablets (20-40 mg total) by mouth 5 (five) times daily. TAKES 1 TABLET  4 TIMES DAILY AND 2 AT BEDTIME, Disp: 120 each, Rfl: 12;  diazepam (VALIUM) 5 MG tablet, Take 5 mg by mouth every 6 (six) hours as needed for anxiety., Disp: , Rfl:  HYDROcodone-acetaminophen (VICODIN) 5-500 MG per tablet, Take 0.5-1 tablets by mouth every 6 (six) hours as needed for pain., Disp: 120 tablet, Rfl: 0;  insulin glargine (LANTUS) 100 UNIT/ML injection, Inject 25 Units into the skin daily before breakfast. , Disp: , Rfl: ;  insulin lispro (HUMALOG) 100 UNIT/ML injection, Inject 3-9 Units into the skin 3 (three) times daily before meals. SS, Disp: , Rfl:  midodrine (PROAMATINE) 5 MG tablet, Take 10 mg by mouth as needed. , Disp: , Rfl: ;  Pancrelipase, Lip-Prot-Amyl, 24000 UNITS CPEP, Take 3 capsules by mouth 3 (three) times daily with meals., Disp: , Rfl: ;  promethazine (PHENERGAN) 12.5 MG tablet, Take 12.5 mg by mouth every 6 (six) hours as needed for nausea. , Disp: , Rfl: ;  solifenacin  (VESICARE) 10 MG tablet, Take by mouth daily., Disp: , Rfl:  tiZANidine (ZANAFLEX) 4 MG tablet, Take 4 mg by mouth every 6 (six) hours as needed for muscle spasms., Disp: , Rfl: ;  traMADol (ULTRAM) 50 MG tablet, Take 50 mg by mouth 3 (three) times daily., Disp: , Rfl:   Allergies:  Allergies  Allergen Reactions  . Ciprocin-Fluocin-Procin [Fluocinolone Acetonide] Rash    Pt states this happened with IV Cipro. ABLE TO TAKE PO CIPRO.    Past Medical History, Surgical history, Social history, and Family History were reviewed and updated.  Review of Systems: As above  Physical Exam:  height is 5\' 9"  (1.753 m). His oral temperature is 98.2 F (36.8 C). His blood pressure is 113/70 and his pulse is 78. His respiration is 16.   Well-developed and well-nourished gentleman. Lungs are clear. Cardiac exam regular in rhythm. Abdomen soft. Has multiple laparotomy scars. He has no fluid wave. There is no guarding or rebound tenderness. Neck exam no tenderness over the spine. Extremity shows no clubbing cyanosis or edema. Skin exam no rashes. Neurological exam is non-focal outside of the paraplegia and his legs.  Lab Results  Component Value Date   WBC 6.3 04/11/2014   HGB 13.6 04/11/2014   HCT 42.3 04/11/2014   MCV 80* 04/11/2014   PLT 208 04/11/2014     Chemistry      Component Value Date/Time   NA 137 03/13/2014 0906   NA 133*  01/29/2014 1555   K 3.4 03/13/2014 0906   K 4.3 01/29/2014 1555   CL 99 03/13/2014 0906   CL 91* 01/29/2014 1555   CO2 32 03/13/2014 0906   CO2 26 01/29/2014 1555   BUN 14 03/13/2014 0906   BUN 20 01/29/2014 1555   CREATININE 0.7 03/13/2014 0906   CREATININE 0.80 01/29/2014 1555      Component Value Date/Time   CALCIUM 8.5 03/13/2014 0906   CALCIUM 9.4 01/29/2014 1555   ALKPHOS 147* 03/13/2014 0906   ALKPHOS 245* 04/10/2013 0927   AST 40* 03/13/2014 0906   AST 33 04/10/2013 0927   ALT 24 03/13/2014 0906   ALT 32 04/10/2013 0927   BILITOT 0.60 03/13/2014 0906   BILITOT 0.7 04/10/2013 0927          Impression and Plan: [name] is 53 year old gentleman with a neuroendocrine tumor. This is holding nice and stable. He didn't undergo another round of intrahepatic therapy with yttrium 90. We have held off on systemic therapy now. I did been off any systemic therapy for over a year.  We don't have to do any more scans on him probably until July or August.  He's done well with Sandostatin.  I am happy  that his quality of life continues to be quite good.   Scott Napoleon, MD 5/7/20157:02 PM

## 2014-04-15 ENCOUNTER — Other Ambulatory Visit: Payer: Self-pay | Admitting: Hematology & Oncology

## 2014-04-17 LAB — COMPREHENSIVE METABOLIC PANEL
ALT: 21 U/L (ref 0–53)
AST: 33 U/L (ref 0–37)
Albumin: 3.3 g/dL — ABNORMAL LOW (ref 3.5–5.2)
Alkaline Phosphatase: 156 U/L — ABNORMAL HIGH (ref 39–117)
BILIRUBIN TOTAL: 0.5 mg/dL (ref 0.2–1.2)
BUN: 15 mg/dL (ref 6–23)
CALCIUM: 8.5 mg/dL (ref 8.4–10.5)
CHLORIDE: 96 meq/L (ref 96–112)
CO2: 29 mEq/L (ref 19–32)
CREATININE: 0.73 mg/dL (ref 0.50–1.35)
GLUCOSE: 227 mg/dL — AB (ref 70–99)
Potassium: 4.2 mEq/L (ref 3.5–5.3)
Sodium: 133 mEq/L — ABNORMAL LOW (ref 135–145)
Total Protein: 6.5 g/dL (ref 6.0–8.3)

## 2014-04-17 LAB — CHROMOGRANIN A: Chromogranin A: 5 ng/mL (ref 1.9–15.0)

## 2014-04-19 ENCOUNTER — Encounter: Payer: Self-pay | Admitting: *Deleted

## 2014-04-19 ENCOUNTER — Other Ambulatory Visit: Payer: Self-pay | Admitting: Hematology & Oncology

## 2014-05-07 ENCOUNTER — Other Ambulatory Visit (HOSPITAL_BASED_OUTPATIENT_CLINIC_OR_DEPARTMENT_OTHER): Payer: 59 | Admitting: Lab

## 2014-05-07 ENCOUNTER — Ambulatory Visit (HOSPITAL_BASED_OUTPATIENT_CLINIC_OR_DEPARTMENT_OTHER): Payer: 59

## 2014-05-07 ENCOUNTER — Ambulatory Visit (HOSPITAL_BASED_OUTPATIENT_CLINIC_OR_DEPARTMENT_OTHER): Payer: 59 | Admitting: Hematology & Oncology

## 2014-05-07 ENCOUNTER — Encounter: Payer: Self-pay | Admitting: Hematology & Oncology

## 2014-05-07 VITALS — BP 90/62 | HR 82 | Temp 97.8°F | Resp 16 | Ht 69.0 in

## 2014-05-07 DIAGNOSIS — C7A8 Other malignant neuroendocrine tumors: Secondary | ICD-10-CM

## 2014-05-07 DIAGNOSIS — Z859 Personal history of malignant neoplasm, unspecified: Secondary | ICD-10-CM

## 2014-05-07 DIAGNOSIS — C787 Secondary malignant neoplasm of liver and intrahepatic bile duct: Secondary | ICD-10-CM

## 2014-05-07 DIAGNOSIS — C7B8 Other secondary neuroendocrine tumors: Secondary | ICD-10-CM

## 2014-05-07 DIAGNOSIS — C7A00 Malignant carcinoid tumor of unspecified site: Secondary | ICD-10-CM

## 2014-05-07 LAB — CMP (CANCER CENTER ONLY)
ALT(SGPT): 21 U/L (ref 10–47)
AST: 29 U/L (ref 11–38)
Albumin: 3 g/dL — ABNORMAL LOW (ref 3.3–5.5)
Alkaline Phosphatase: 153 U/L — ABNORMAL HIGH (ref 26–84)
BILIRUBIN TOTAL: 0.7 mg/dL (ref 0.20–1.60)
BUN, Bld: 17 mg/dL (ref 7–22)
CALCIUM: 8.8 mg/dL (ref 8.0–10.3)
CO2: 30 mEq/L (ref 18–33)
Chloride: 101 mEq/L (ref 98–108)
Creat: 0.8 mg/dl (ref 0.6–1.2)
GLUCOSE: 140 mg/dL — AB (ref 73–118)
Potassium: 3.5 mEq/L (ref 3.3–4.7)
SODIUM: 140 meq/L (ref 128–145)
TOTAL PROTEIN: 7.6 g/dL (ref 6.4–8.1)

## 2014-05-07 LAB — CBC WITH DIFFERENTIAL (CANCER CENTER ONLY)
BASO#: 0 10*3/uL (ref 0.0–0.2)
BASO%: 0.5 % (ref 0.0–2.0)
EOS%: 1.3 % (ref 0.0–7.0)
Eosinophils Absolute: 0.1 10*3/uL (ref 0.0–0.5)
HCT: 42.2 % (ref 38.7–49.9)
HEMOGLOBIN: 13.4 g/dL (ref 13.0–17.1)
LYMPH#: 1.1 10*3/uL (ref 0.9–3.3)
LYMPH%: 20.7 % (ref 14.0–48.0)
MCH: 25.2 pg — AB (ref 28.0–33.4)
MCHC: 31.8 g/dL — AB (ref 32.0–35.9)
MCV: 80 fL — ABNORMAL LOW (ref 82–98)
MONO#: 0.7 10*3/uL (ref 0.1–0.9)
MONO%: 12.2 % (ref 0.0–13.0)
NEUT#: 3.6 10*3/uL (ref 1.5–6.5)
NEUT%: 65.3 % (ref 40.0–80.0)
Platelets: 229 10*3/uL (ref 145–400)
RBC: 5.31 10*6/uL (ref 4.20–5.70)
RDW: 15 % (ref 11.1–15.7)
WBC: 5.5 10*3/uL (ref 4.0–10.0)

## 2014-05-07 MED ORDER — OCTREOTIDE ACETATE 30 MG IM KIT
30.0000 mg | PACK | Freq: Once | INTRAMUSCULAR | Status: AC
  Administered 2014-05-07: 30 mg via INTRAMUSCULAR

## 2014-05-07 MED ORDER — OCTREOTIDE ACETATE 30 MG IM KIT
PACK | INTRAMUSCULAR | Status: AC
  Filled 2014-05-07: qty 1

## 2014-05-07 NOTE — Patient Instructions (Signed)

## 2014-05-07 NOTE — Progress Notes (Signed)
Hematology and Oncology Follow Up Visit  [name] [id number] Dec 13, 1960 53 y.o. 05/07/2014   Principle Diagnosis:   Metastatic neuroendocrine carcinoma  Von Hipple-Lindau Syndrome  Current Therapy:   Sandostatin 30 mg IM every month     Interim History:  [name] is back for followup. He's doing pre-well. She looks quite good today. He's eating a little better. He's had not as much in the way of abdominal spasms.  His suprapubic catheter is a doing well. This makes it much more convenient for him with his thigh to his bladder incontinence.  We'll last saw him, his unrented a level was 5.  His neck has been doing okay. 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 These were resected. He still has muscle spasms.  He's had no cough. His blood sugars haven't doing fairly well. They typically of a viable the higher side we see this.  He's had no problems with infection. He's had no problems with headache. No rashes have been noted.  Medications: Current outpatient prescriptions:baclofen (LIORESAL) 20 MG tablet, Take 1-2 tablets (20-40 mg total) by mouth 5 (five) times daily. TAKES 1 TABLET  4 TIMES DAILY AND 2 AT BEDTIME, Disp: 120 each, Rfl: 12;  diazepam (VALIUM) 5 MG tablet, Take 5 mg by mouth every 6 (six) hours as needed for anxiety., Disp: , Rfl:  HYDROcodone-acetaminophen (VICODIN) 5-500 MG per tablet, Take 0.5-1 tablets by mouth every 6 (six) hours as needed for pain., Disp: 120 tablet, Rfl: 0;  insulin glargine (LANTUS) 100 UNIT/ML injection, Inject 25 Units into the skin daily before breakfast. , Disp: , Rfl: ;  insulin lispro (HUMALOG) 100 UNIT/ML injection, Inject 3-9 Units into the skin 3 (three) times daily before meals. SS, Disp: , Rfl:  midodrine (PROAMATINE) 5 MG tablet, Take 10 mg by mouth as needed. , Disp: , Rfl: ;  Pancrelipase, Lip-Prot-Amyl, 24000 UNITS CPEP, Take 3 capsules by mouth 3 (three) times daily with meals., Disp: , Rfl: ;   promethazine (PHENERGAN) 12.5 MG tablet, Take 12.5 mg by mouth every 6 (six) hours as needed for nausea. , Disp: , Rfl: ;  solifenacin (VESICARE) 10 MG tablet, Take by mouth daily., Disp: , Rfl:  tiZANidine (ZANAFLEX) 4 MG tablet, Take 4 mg by mouth every 6 (six) hours as needed for muscle spasms., Disp: , Rfl: ;  traMADol (ULTRAM) 50 MG tablet, TAKE 1 TABLET BY MOUTH 3 TIMES DAILY, Disp: 90 tablet, Rfl: 2  Allergies:  Allergies  Allergen Reactions  . Ciprocin-Fluocin-Procin [Fluocinolone Acetonide] Rash    Pt states this happened with IV Cipro. ABLE TO TAKE PO CIPRO.    Past Medical History, Surgical history, Social history, and Family History were reviewed and updated.  Review of Systems: As above  Physical Exam:  height is 5\' 9"  (1.753 m). His oral temperature is 97.8 F (36.6 C). His blood pressure is 90/62 and his pulse is 82. His respiration is 16.   Fairly well-developed and well-nourished white gentleman. 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 His head and exam to Dr. or oral lesions. He is no palpable cervical or supraclavicular lymph nodes. Lungs are clear. Cardiac exam regular in rhythm with no murmurs rubs or bruits. Abdomen is soft. Has laparotomy scars. He's had no fluid wave. No guarding or rebound tenderness. He is no palpable liver or spleen tip. Exam shows a laminectomy scar the cervical spine. Has a tenderness over the spine. Examination of the paralysis from the  waist down. His in his upper extremities he has good strength. Skin exam no rashes. Neurological exam shows the paralysis from the waist down.  Lab Results  Component Value Date   WBC 5.5 05/07/2014   HGB 13.4 05/07/2014   HCT 42.2 05/07/2014   MCV 80* 05/07/2014   PLT 229 05/07/2014     Chemistry      Component Value Date/Time   NA 140 05/07/2014 0915   NA 133* 04/11/2014 1513   K 3.5 05/07/2014 0915   K 4.2 04/11/2014 1513   CL 101 05/07/2014 0915   CL 96 04/11/2014 1513   CO2 30 05/07/2014 0915   CO2 29 04/11/2014 1513    BUN 17 05/07/2014 0915   BUN 15 04/11/2014 1513   CREATININE 0.8 05/07/2014 0915   CREATININE 0.73 04/11/2014 1513      Component Value Date/Time   CALCIUM 8.8 05/07/2014 0915   CALCIUM 8.5 04/11/2014 1513   ALKPHOS 153* 05/07/2014 0915   ALKPHOS 156* 04/11/2014 1513   AST 29 05/07/2014 0915   AST 33 04/11/2014 1513   ALT 21 05/07/2014 0915   ALT 21 04/11/2014 1513   BILITOT 0.70 05/07/2014 0915   BILITOT 0.5 04/11/2014 1513         Impression and Plan: [name] is 53 year old gentleman with metastatic neuroendocrine carcinomas. He has the von Hippel-Lindau syndrome. He, so far, has been doing very well. We've not had to get him back onto systemic therapy.  He probably due for scans in August.  I'll see him back in one more month.   Volanda Napoleon, MD 6/2/201510:44 AM

## 2014-05-10 ENCOUNTER — Other Ambulatory Visit: Payer: Self-pay

## 2014-05-10 ENCOUNTER — Inpatient Hospital Stay (HOSPITAL_COMMUNITY)
Admission: EM | Admit: 2014-05-10 | Discharge: 2014-05-15 | DRG: 917 | Disposition: A | Discharge status: Home / Self Care | Payer: 59 | Attending: Internal Medicine | Admitting: Internal Medicine

## 2014-05-10 DIAGNOSIS — G8220 Paraplegia, unspecified: Secondary | ICD-10-CM | POA: Diagnosis present

## 2014-05-10 DIAGNOSIS — T50992A Poisoning by other drugs, medicaments and biological substances, intentional self-harm, initial encounter: Secondary | ICD-10-CM | POA: Diagnosis present

## 2014-05-10 DIAGNOSIS — R4182 Altered mental status, unspecified: Secondary | ICD-10-CM

## 2014-05-10 DIAGNOSIS — T428X1A Poisoning by antiparkinsonism drugs and other central muscle-tone depressants, accidental (unintentional), initial encounter: Secondary | ICD-10-CM | POA: Diagnosis present

## 2014-05-10 DIAGNOSIS — Y92009 Unspecified place in unspecified non-institutional (private) residence as the place of occurrence of the external cause: Secondary | ICD-10-CM

## 2014-05-10 DIAGNOSIS — T481X4A Poisoning by skeletal muscle relaxants [neuromuscular blocking agents], undetermined, initial encounter: Principal | ICD-10-CM | POA: Diagnosis present

## 2014-05-10 DIAGNOSIS — J9690 Respiratory failure, unspecified, unspecified whether with hypoxia or hypercapnia: Secondary | ICD-10-CM | POA: Diagnosis present

## 2014-05-10 DIAGNOSIS — C7A00 Malignant carcinoid tumor of unspecified site: Secondary | ICD-10-CM | POA: Diagnosis present

## 2014-05-10 DIAGNOSIS — R259 Unspecified abnormal involuntary movements: Secondary | ICD-10-CM | POA: Diagnosis present

## 2014-05-10 DIAGNOSIS — C787 Secondary malignant neoplasm of liver and intrahepatic bile duct: Secondary | ICD-10-CM | POA: Diagnosis present

## 2014-05-10 DIAGNOSIS — F39 Unspecified mood [affective] disorder: Secondary | ICD-10-CM | POA: Diagnosis present

## 2014-05-10 DIAGNOSIS — Z794 Long term (current) use of insulin: Secondary | ICD-10-CM

## 2014-05-10 DIAGNOSIS — Z79899 Other long term (current) drug therapy: Secondary | ICD-10-CM

## 2014-05-10 DIAGNOSIS — IMO0002 Reserved for concepts with insufficient information to code with codable children: Secondary | ICD-10-CM

## 2014-05-10 DIAGNOSIS — T50902A Poisoning by unspecified drugs, medicaments and biological substances, intentional self-harm, initial encounter: Secondary | ICD-10-CM

## 2014-05-10 DIAGNOSIS — E119 Type 2 diabetes mellitus without complications: Secondary | ICD-10-CM | POA: Diagnosis present

## 2014-05-10 DIAGNOSIS — Q859 Phakomatosis, unspecified: Secondary | ICD-10-CM

## 2014-05-10 DIAGNOSIS — Z87891 Personal history of nicotine dependence: Secondary | ICD-10-CM

## 2014-05-10 DIAGNOSIS — F4323 Adjustment disorder with mixed anxiety and depressed mood: Secondary | ICD-10-CM

## 2014-05-10 DIAGNOSIS — I959 Hypotension, unspecified: Secondary | ICD-10-CM

## 2014-05-10 DIAGNOSIS — Z905 Acquired absence of kidney: Secondary | ICD-10-CM

## 2014-05-10 DIAGNOSIS — J9600 Acute respiratory failure, unspecified whether with hypoxia or hypercapnia: Secondary | ICD-10-CM

## 2014-05-10 LAB — CBC WITH DIFFERENTIAL/PLATELET
BASOS ABS: 0 10*3/uL (ref 0.0–0.1)
BASOS PCT: 0 % (ref 0–1)
EOS ABS: 0.1 10*3/uL (ref 0.0–0.7)
Eosinophils Relative: 2 % (ref 0–5)
HCT: 38.9 % — ABNORMAL LOW (ref 39.0–52.0)
Hemoglobin: 12.3 g/dL — ABNORMAL LOW (ref 13.0–17.0)
LYMPHS ABS: 1.3 10*3/uL (ref 0.7–4.0)
Lymphocytes Relative: 21 % (ref 12–46)
MCH: 24.8 pg — ABNORMAL LOW (ref 26.0–34.0)
MCHC: 31.6 g/dL (ref 30.0–36.0)
MCV: 78.4 fL (ref 78.0–100.0)
Monocytes Absolute: 0.5 10*3/uL (ref 0.1–1.0)
Monocytes Relative: 8 % (ref 3–12)
NEUTROS PCT: 69 % (ref 43–77)
Neutro Abs: 4.1 10*3/uL (ref 1.7–7.7)
PLATELETS: 208 10*3/uL (ref 150–400)
RBC: 4.96 MIL/uL (ref 4.22–5.81)
RDW: 14.5 % (ref 11.5–15.5)
WBC: 5.9 10*3/uL (ref 4.0–10.5)

## 2014-05-10 NOTE — ED Notes (Signed)
Sitter at bedside.

## 2014-05-10 NOTE — ED Notes (Signed)
Bed: DD22 Expected date:  Expected time:  Means of arrival:  Comments: EMS 53yo M 15-20 Baclofen during an argument w/ wife, Paralyzed

## 2014-05-10 NOTE — ED Notes (Signed)
Pt took 15 to 20 Baclofen 20 mg tablets in overdose attempt after argument with wife.

## 2014-05-10 NOTE — ED Provider Notes (Signed)
CSN: [id number]     Arrival date & time 05/10/14  2231 History   First MD Initiated Contact with Patient 05/10/14 2300     Chief Complaint  Patient presents with  . Drug Overdose     (Consider location/radiation/quality/duration/timing/severity/associated sxs/prior Treatment) HPI Comments: 53 year old male with extensive medical history including von Hippel-Lindau syndrome with multiple surgeries the spine, diabetes, neuroendocrine carcinoma with metastasis to liver, optic nerve tumor, paralysis below the nipples presents after drug overdose. Patient was in an argument with his wife regarding the amount of care she has provided him and she wanted a separate slightly him threatening to kill himself and he took 15-20 baclofen pills. Patient normally takes 6 a day. Patient felt it was part of the moment and it wasn't planned prior. Patient has had suicidal thoughts the past has never attempted. Recently patient feels she's been stronger and more active and not be been normal. Patient denies other ingestions.  Patient is a 53 y.o. male presenting with Overdose. The history is provided by the patient.  Drug Overdose Pertinent negatives include no chest pain, no headaches and no shortness of breath.    Past Medical History  Diagnosis Date  . 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 Diabetes mellitus without complication   . 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 Tumor of optic nerve     right -"stable"  . Headache(784.0)     neuropathic pain usually behind eyes related to blood pressure  . Transfusion history     '09 .    Past Surgical History  Procedure Laterality Date  . Back surgery      '91- T#-T5 "tumor resection"-benign  . Reconstruction breast w/ tram flap    . Muscle flap myocutaneous / fasciocutaneous of trunk Left     '01  . Craniotomy for tumor      medulla tumor "benign tumor"-salvage own bone for closure  . Peripherally inserted  central catheter insertion      past hx.  . Enucleation Left     '05 with prosthesis  . Cervical spine surgery      '07- resection tumor C5-C7-"benign"  . Partial nephrectomy Right     '09- cancer  . Pancreas surgery      '09- with partial liver resection/ pancreas "Whipple"  . 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 Insertion of suprapubic catheter N/A 02/04/2014    Procedure: INSERTION OF SUPRAPUBIC CATHETER;  Surgeon: Franchot Gallo, MD;  Location: [facility];  Service: Urology;  Laterality: N/A;  . Cystoscopy with litholapaxy N/A 02/04/2014    Procedure: CYSTOSCOPY WITH LITHOLAPAXY;  Surgeon: Franchot Gallo, MD;  Location: [facility];  Service: Urology;  Laterality: N/A;   No family history on file. History  Substance Use Topics  . Smoking status: Former Smoker -- 0.50 packs/day for 6 years    Types: Cigarettes    Start date: 01/15/1999    Quit date: 09/23/2008  . Smokeless tobacco: Never Used     Comment: quit 6 years ago  . Alcohol Use: Yes     Comment: only rare occasions    Review of Systems  Constitutional: Negative for fever and chills.  HENT: Negative for congestion.   Eyes: Positive for visual disturbance (chronic, prosthetic left eye).  Respiratory: Negative for shortness of breath.   Cardiovascular: Negative for chest pain.  Gastrointestinal: Negative for vomiting.  Musculoskeletal: Negative for  neck pain.  Skin: Negative for rash.  Neurological: Negative for light-headedness and headaches.      Allergies  Ciprocin-fluocin-procin  Home Medications   Prior to Admission medications   Medication Sig Start Date End Date Taking? Authorizing Provider  baclofen (LIORESAL) 20 MG tablet Take 1-2 tablets (20-40 mg total) by mouth 5 (five) times daily. TAKES 1 TABLET  4 TIMES DAILY AND 2 AT BEDTIME 02/12/14  Yes Volanda Napoleon, MD  diazepam (VALIUM) 5 MG tablet Take 5 mg by mouth every 6 (six) hours as needed for anxiety.   Yes  Historical Provider, MD  HYDROcodone-acetaminophen (VICODIN) 5-500 MG per tablet Take 0.5-1 tablets by mouth every 6 (six) hours as needed for pain. 02/12/14  Yes Volanda Napoleon, MD  insulin glargine (LANTUS) 100 UNIT/ML injection Inject 25 Units into the skin daily before breakfast.    Yes Historical Provider, MD  insulin lispro (HUMALOG) 100 UNIT/ML injection Inject 3-9 Units into the skin 3 (three) times daily before meals. SS   Yes Historical Provider, MD  midodrine (PROAMATINE) 5 MG tablet Take 10 mg by mouth as needed.    Yes Historical Provider, MD  Pancrelipase, Lip-Prot-Amyl, 24000 UNITS CPEP Take 3 capsules by mouth 3 (three) times daily with meals.   Yes Historical Provider, MD  promethazine (PHENERGAN) 12.5 MG tablet Take 12.5 mg by mouth every 6 (six) hours as needed for nausea.    Yes Historical Provider, MD  solifenacin (VESICARE) 10 MG tablet Take 10 mg by mouth daily.    Yes Historical Provider, MD  tiZANidine (ZANAFLEX) 4 MG tablet Take 4 mg by mouth every 6 (six) hours as needed for muscle spasms.   Yes Historical Provider, MD  traMADol (ULTRAM) 50 MG tablet Take 50 mg by mouth 3 (three) times daily.   Yes Historical Provider, MD   BP 109/83  Pulse 102  Temp(Src) 98.3 F (36.8 C) (Oral)  Resp 16  SpO2 96% Physical Exam  Nursing note and vitals reviewed. Constitutional: He appears well-developed and well-nourished. No distress.  HENT:  Head: Normocephalic and atraumatic.  Mild dry mucous membranes  Eyes:  Prosthetic left eye  Neck: Normal range of motion. Neck supple.  Cardiovascular: Normal rate and regular rhythm.   Pulmonary/Chest: Effort normal and breath sounds normal.  Abdominal: Soft. He exhibits no distension.  Neurological:  Initially patient alert and oriented to place date time. Patient normal conversation early in ER course. Patient has prosthetic left side. Patient is paralyzed from the nipple line down which is chronic.  During your course patient  became confused, lethargic. This worsened to the point the patient was unresponsive to even severe painful stimuli in a coma-like state.  Skin: Skin is warm.  Psychiatric: He is not agitated and not slowed.  Patient clinically depressed  Patient is in different on whether alive currently.    ED Course  Procedures (including critical care time) CRITICAL CARE Performed by: Mariea Clonts Total critical care time: 70 min  Critical care time was exclusive of separately billable procedures and treating other patients.  Critical care was necessary to treat or prevent imminent or life-threatening deterioration.  Critical care was time spent personally by me on the following activities: development of treatment plan with patient and/or surrogate as well as nursing, discussions with consultants, evaluation of patient's response to treatment, examination of patient, obtaining history from patient or surrogate, ordering and performing treatments and interventions, ordering and review of laboratory studies, ordering and review of radiographic  studies, pulse oximetry and re-evaluation of patient's condition.  INTUBATION Performed by: Mariea Clonts  Required items: required blood products, implants, devices, and special equipment available Patient identity confirmed: provided demographic data and hospital-assigned identification number Time out: Immediately prior to procedure a "time out" was called to verify the correct patient, procedure, equipment, support staff.  Indications: resp failure, apnea Intubation method: direct Preoxygenation:[place] Sedatives: Etomidate Paralytic: rocuronium Tube Size: 7.5 cuffed  Post-procedure assessment: chest rise and ETCO2 monitor Breath sounds: equal and absent over the epigastrium Tube secured with: ETT holder Chest x-ray interpreted by me.  Chest x-ray findings: endotracheal tube in appropriate position  Patient tolerated the procedure well with no  immediate complications.  Labs Review Labs Reviewed  BASIC METABOLIC PANEL - Abnormal; Notable for the following:    Glucose, Bld 188 (*)    All other components within normal limits  CBC WITH DIFFERENTIAL - Abnormal; Notable for the following:    Hemoglobin 12.3 (*)    HCT 38.9 (*)    MCH 24.8 (*)    All other components within normal limits  SALICYLATE LEVEL - Abnormal; Notable for the following:    Salicylate Lvl <4.0 (*)    All other components within normal limits  BLOOD GAS, ARTERIAL - Abnormal; Notable for the following:    pH, Arterial 7.516 (*)    pCO2 arterial 29.0 (*)    pO2, Arterial 303.0 (*)    All other components within normal limits  CBG MONITORING, ED - Abnormal; Notable for the following:    Glucose-Capillary 129 (*)    All other components within normal limits  CBG MONITORING, ED - Abnormal; Notable for the following:    Glucose-Capillary 117 (*)    All other components within normal limits  MRSA PCR SCREENING  ACETAMINOPHEN LEVEL  ETHANOL  URINE RAPID DRUG SCREEN (HOSP PERFORMED)    Imaging Review Dg Chest Portable 1 View  05/11/2014   CLINICAL DATA:  Intubation.  EXAM: PORTABLE CHEST - 1 VIEW  COMPARISON:  Chest CT 04/11/2014.  FINDINGS: The endotracheal tube is 6 cm above the carina. The heart is mildly enlarged but stable. The lungs are grossly clear.  IMPRESSION: The endotracheal tube is 6 cm above the carina.  No significant pulmonary findings.   Electronically Signed   By: Kalman Jewels M.D.   On: 05/11/2014 04:22     EKG Interpretation None     EKG reviewed heart rate 97, right bundle branch block and left posterior fascicular block, nonspecific ST changes, QRS wide 139, right axis deviation, new changes due to previous.     Final diagnoses:  Drug overdose, intentional  Acute respiratory failure  Altered mental status   Patient was baclofen overdose. Will monitor closely for CNS depression, seizures, hypotension. Plan for IV fluids,  psychiatric labs work, EKG. Poison control contacted for further advice. Plan for behavioral health assessment once medically clear.  For the initial hours in the ER patient was basically asymptomatic except mild fatigue. Myself and psychiatry evaluated and felt patient was overall low risk after speaking with him. As the plan was for observation in the ER until psychiatry consult the morning.  On recheck patient gradually became more confused, diaphoretic. On further recheck Patient blood pressure varying significantly becoming hypertensive and confusion worsening and then patient started having spells of apnea and coma.  Patient moved to resuscitation room with respiratory and team at bedside. Patient is a full code and clarified with the wife via nurse phone  call. After prolonged apnea spell and nasal cannula oxygenation decision to intubate for airway protection. Patient intubated with one attempt and updated wife upon arrival. Spoke with ICU for admission. Portable chest x-ray reviewed ET tube proper position.  Updated patient's wife prior to admission. The patients results and plan were reviewed and discussed.   Any x-rays performed were personally reviewed by myself.   Differential diagnosis were considered with the presenting HPI.   Filed Vitals:   05/11/14 0145 05/11/14 0200 05/11/14 0311 05/11/14 0350  BP:  118/88 159/95 134/95  Pulse: 76 73 60 53  Temp:      TempSrc:      Resp: 11 16 19 14   SpO2: 95% 98% 97% 100%    Admission/ observation were discussed with the admitting physician, patient and/or family and they are comfortable with the plan.     Mariea Clonts, MD 05/11/14 [phone]

## 2014-05-10 NOTE — ED Notes (Signed)
Spoke with [name] at [employer] she said it is mostly supportive care, keep on monitor,  There is no antidote , check acetaminophen level,  DO NOT assume brain death if goes into coma because overdose mimics brain death,  There is possibility of confusion,  Lethargy,  Agitation and hallucination,  Coma can develop and loss of reflexes

## 2014-05-11 ENCOUNTER — Emergency Department (HOSPITAL_COMMUNITY): Payer: 59

## 2014-05-11 ENCOUNTER — Inpatient Hospital Stay (HOSPITAL_COMMUNITY): Payer: 59

## 2014-05-11 ENCOUNTER — Encounter (HOSPITAL_COMMUNITY): Payer: Self-pay | Admitting: *Deleted

## 2014-05-11 DIAGNOSIS — R918 Other nonspecific abnormal finding of lung field: Secondary | ICD-10-CM | POA: Diagnosis not present

## 2014-05-11 DIAGNOSIS — T428X1A Poisoning by antiparkinsonism drugs and other central muscle-tone depressants, accidental (unintentional), initial encounter: Secondary | ICD-10-CM | POA: Diagnosis present

## 2014-05-11 DIAGNOSIS — Z452 Encounter for adjustment and management of vascular access device: Secondary | ICD-10-CM | POA: Diagnosis not present

## 2014-05-11 DIAGNOSIS — J9600 Acute respiratory failure, unspecified whether with hypoxia or hypercapnia: Secondary | ICD-10-CM

## 2014-05-11 DIAGNOSIS — R4182 Altered mental status, unspecified: Secondary | ICD-10-CM

## 2014-05-11 DIAGNOSIS — I959 Hypotension, unspecified: Secondary | ICD-10-CM

## 2014-05-11 LAB — BLOOD GAS, ARTERIAL
ACID-BASE EXCESS: 1.5 mmol/L (ref 0.0–2.0)
BICARBONATE: 23.3 meq/L (ref 20.0–24.0)
Drawn by: 308601
FIO2: 0.6 %
MECHVT: 570 mL
O2 Saturation: 98.9 %
PEEP: 5 cmH2O
PO2 ART: 303 mmHg — AB (ref 80.0–100.0)
Patient temperature: 98.6
RATE: 14 resp/min
TCO2: 20.5 mmol/L (ref 0–100)
pCO2 arterial: 29 mmHg — ABNORMAL LOW (ref 35.0–45.0)
pH, Arterial: 7.516 — ABNORMAL HIGH (ref 7.350–7.450)

## 2014-05-11 LAB — BASIC METABOLIC PANEL
BUN: 16 mg/dL (ref 6–23)
CO2: 26 mEq/L (ref 19–32)
Calcium: 8.4 mg/dL (ref 8.4–10.5)
Chloride: 100 mEq/L (ref 96–112)
Creatinine, Ser: 0.58 mg/dL (ref 0.50–1.35)
GFR calc Af Amer: >90 mL/min (ref >90)
GFR calc non Af Amer: >90 mL/min (ref >90)
GLUCOSE: 188 mg/dL — AB (ref 70–99)
Potassium: 3.9 mEq/L (ref 3.7–5.3)
SODIUM: 138 meq/L (ref 137–147)

## 2014-05-11 LAB — CHROMOGRANIN A: Chromogranin A: 4.2 ng/mL (ref 1.9–15.0)

## 2014-05-11 LAB — RAPID URINE DRUG SCREEN, HOSP PERFORMED
Amphetamines: NOT DETECTED
BARBITURATES: NOT DETECTED
Benzodiazepines: NOT DETECTED
Cocaine: NOT DETECTED
Opiates: NOT DETECTED
Tetrahydrocannabinol: NOT DETECTED

## 2014-05-11 LAB — GLUCOSE, CAPILLARY
GLUCOSE-CAPILLARY: 137 mg/dL — AB (ref 70–99)
Glucose-Capillary: 118 mg/dL — ABNORMAL HIGH (ref 70–99)
Glucose-Capillary: 149 mg/dL — ABNORMAL HIGH (ref 70–99)
Glucose-Capillary: 156 mg/dL — ABNORMAL HIGH (ref 70–99)

## 2014-05-11 LAB — LACTATE DEHYDROGENASE: LDH: 140 U/L (ref 94–250)

## 2014-05-11 LAB — CBG MONITORING, ED
GLUCOSE-CAPILLARY: 129 mg/dL — AB (ref 70–99)
GLUCOSE-CAPILLARY: 117 mg/dL — AB (ref 70–99)

## 2014-05-11 LAB — ACETAMINOPHEN LEVEL: Acetaminophen (Tylenol), Serum: <15 ug/mL (ref 10–30)

## 2014-05-11 LAB — MRSA PCR SCREENING: MRSA by PCR: NEGATIVE

## 2014-05-11 LAB — CORTISOL: Cortisol, Plasma: 5.5 ug/dL

## 2014-05-11 LAB — SALICYLATE LEVEL: Salicylate Lvl: <2 mg/dL — ABNORMAL LOW (ref 2.8–20.0)

## 2014-05-11 LAB — ETHANOL

## 2014-05-11 MED ORDER — MIDAZOLAM HCL 2 MG/2ML IJ SOLN
2.0000 mg | INTRAMUSCULAR | Status: DC | PRN
  Administered 2014-05-11 – 2014-05-12 (×3): 2 mg via INTRAVENOUS

## 2014-05-11 MED ORDER — ROCURONIUM BROMIDE 50 MG/5ML IV SOLN
INTRAVENOUS | Status: DC
Start: 2014-05-11 — End: 2014-05-11
  Filled 2014-05-11: qty 2

## 2014-05-11 MED ORDER — LORAZEPAM 2 MG/ML IJ SOLN
INTRAMUSCULAR | Status: AC
  Administered 2014-05-11: 2 mg via INTRAVENOUS
  Filled 2014-05-11: qty 1

## 2014-05-11 MED ORDER — ETOMIDATE 2 MG/ML IV SOLN
INTRAVENOUS | Status: AC
  Filled 2014-05-11: qty 20

## 2014-05-11 MED ORDER — HYDROCORTISONE NA SUCCINATE PF 100 MG IJ SOLR
50.0000 mg | Freq: Four times a day (QID) | INTRAMUSCULAR | Status: DC
  Administered 2014-05-11 – 2014-05-13 (×8): 50 mg via INTRAVENOUS
  Filled 2014-05-11 (×3): qty 1
  Filled 2014-05-11: qty 2
  Filled 2014-05-11: qty 1
  Filled 2014-05-11: qty 2
  Filled 2014-05-11 (×8): qty 1

## 2014-05-11 MED ORDER — SODIUM CHLORIDE 0.9 % IV SOLN
50.0000 ug/h | INTRAVENOUS | Status: DC
  Administered 2014-05-11: 50 ug/h via INTRAVENOUS
  Filled 2014-05-11: qty 50

## 2014-05-11 MED ORDER — ETOMIDATE 2 MG/ML IV SOLN
INTRAVENOUS | Status: DC | PRN
  Administered 2014-05-11: 20 mg via INTRAVENOUS

## 2014-05-11 MED ORDER — BIOTENE DRY MOUTH MT LIQD: 15.0000 mL | Freq: Two times a day (BID) | OROMUCOSAL | Status: DC

## 2014-05-11 MED ORDER — DOPAMINE-DEXTROSE 3.2-5 MG/ML-% IV SOLN
INTRAVENOUS | Status: AC
  Filled 2014-05-11: qty 250

## 2014-05-11 MED ORDER — SUCCINYLCHOLINE CHLORIDE 20 MG/ML IJ SOLN
INTRAMUSCULAR | Status: AC
  Filled 2014-05-11: qty 1

## 2014-05-11 MED ORDER — ONDANSETRON HCL 4 MG/2ML IJ SOLN
4.0000 mg | Freq: Once | INTRAMUSCULAR | Status: AC
Start: 2014-05-11 — End: 2014-05-11
  Administered 2014-05-11: 4 mg via INTRAVENOUS

## 2014-05-11 MED ORDER — SODIUM CHLORIDE 0.9 % IV SOLN
2.0000 mg/h | INTRAVENOUS | Status: DC
  Administered 2014-05-11 – 2014-05-12 (×3): 2 mg/h via INTRAVENOUS
  Administered 2014-05-12: 1 mg/h via INTRAVENOUS
  Administered 2014-05-13: 8 mg/h via INTRAVENOUS
  Administered 2014-05-13: 10 mg/h via INTRAVENOUS
  Filled 2014-05-11 (×7): qty 10

## 2014-05-11 MED ORDER — SODIUM CHLORIDE 0.9 % IV SOLN
INTRAVENOUS | Status: DC | PRN
  Administered 2014-05-11 (×2): 1000 mL via INTRAVENOUS
  Administered 2014-05-11: 500 mL via INTRAVENOUS

## 2014-05-11 MED ORDER — CHLORHEXIDINE GLUCONATE 0.12 % MT SOLN: 15.0000 mL | Freq: Two times a day (BID) | OROMUCOSAL | Status: DC

## 2014-05-11 MED ORDER — LIDOCAINE HCL (CARDIAC) 20 MG/ML IV SOLN
INTRAVENOUS | Status: AC
  Filled 2014-05-11: qty 5

## 2014-05-11 MED ORDER — INSULIN ASPART 100 UNIT/ML ~~LOC~~ SOLN
2.0000 [IU] | SUBCUTANEOUS | Status: DC
  Administered 2014-05-11 (×2): 2 [IU] via SUBCUTANEOUS
  Administered 2014-05-11: 4 [IU] via SUBCUTANEOUS
  Administered 2014-05-12: 6 [IU] via SUBCUTANEOUS
  Administered 2014-05-12 (×3): 4 [IU] via SUBCUTANEOUS
  Administered 2014-05-12: 2 [IU] via SUBCUTANEOUS
  Administered 2014-05-12: 4 [IU] via SUBCUTANEOUS
  Administered 2014-05-13: 6 [IU] via SUBCUTANEOUS
  Administered 2014-05-13: 2 [IU] via SUBCUTANEOUS
  Administered 2014-05-13: 6 [IU] via SUBCUTANEOUS

## 2014-05-11 MED ORDER — SODIUM CHLORIDE 0.9 % IV BOLUS (SEPSIS)
1000.0000 mL | Freq: Once | INTRAVENOUS | Status: AC
  Administered 2014-05-11: 1000 mL via INTRAVENOUS

## 2014-05-11 MED ORDER — FENTANYL BOLUS VIA INFUSION
50.0000 ug | INTRAVENOUS | Status: DC | PRN
  Administered 2014-05-11: 50 ug via INTRAVENOUS
  Administered 2014-05-11 – 2014-05-12 (×3): 100 ug via INTRAVENOUS
  Administered 2014-05-12: 25 ug via INTRAVENOUS
  Filled 2014-05-11: qty 100

## 2014-05-11 MED ORDER — MIDODRINE HCL 5 MG PO TABS
10.0000 mg | ORAL_TABLET | Freq: Three times a day (TID) | ORAL | Status: DC
  Administered 2014-05-11 – 2014-05-15 (×8): 10 mg via ORAL
  Filled 2014-05-11 (×15): qty 2

## 2014-05-11 MED ORDER — LORAZEPAM 2 MG/ML IJ SOLN
2.0000 mg | Freq: Once | INTRAMUSCULAR | Status: AC
  Administered 2014-05-11: 2 mg via INTRAVENOUS

## 2014-05-11 MED ORDER — NOREPINEPHRINE BITARTRATE 1 MG/ML IV SOLN
2.0000 ug/min | INTRAVENOUS | Status: DC
  Administered 2014-05-11: 2 ug/min via INTRAVENOUS
  Filled 2014-05-11: qty 4

## 2014-05-11 MED ORDER — SODIUM CHLORIDE 0.9 % IV SOLN: 250.0000 mL | INTRAVENOUS | Status: DC | PRN

## 2014-05-11 MED ORDER — ROCURONIUM BROMIDE 50 MG/5ML IV SOLN
INTRAVENOUS | Status: AC
  Filled 2014-05-11: qty 2

## 2014-05-11 MED ORDER — BIOTENE DRY MOUTH MT LIQD
15.0000 mL | Freq: Four times a day (QID) | OROMUCOSAL | Status: DC
  Administered 2014-05-12 – 2014-05-15 (×11): 15 mL via OROMUCOSAL

## 2014-05-11 MED ORDER — LIDOCAINE HCL (CARDIAC) 20 MG/ML IV SOLN
INTRAVENOUS | Status: AC
Start: 2014-05-11 — End: 2014-05-11
  Filled 2014-05-11: qty 5

## 2014-05-11 MED ORDER — ALBUTEROL SULFATE (2.5 MG/3ML) 0.083% IN NEBU: 2.5000 mg | INHALATION_SOLUTION | RESPIRATORY_TRACT | Status: DC | PRN

## 2014-05-11 MED ORDER — PANTOPRAZOLE SODIUM 40 MG IV SOLR
40.0000 mg | Freq: Every day | INTRAVENOUS | Status: DC
  Administered 2014-05-11 – 2014-05-14 (×3): 40 mg via INTRAVENOUS
  Filled 2014-05-11 (×3): qty 40

## 2014-05-11 MED ORDER — DOPAMINE-DEXTROSE 3.2-5 MG/ML-% IV SOLN
2.0000 ug/kg/min | INTRAVENOUS | Status: DC
  Administered 2014-05-11: 5 ug/kg/min via INTRAVENOUS
  Administered 2014-05-12: 8 ug/kg/min via INTRAVENOUS
  Filled 2014-05-11: qty 250

## 2014-05-11 MED ORDER — SODIUM CHLORIDE 0.9 % IV SOLN
0.0000 ug/h | INTRAVENOUS | Status: DC
  Administered 2014-05-12 (×2): 200 ug/h via INTRAVENOUS
  Administered 2014-05-13: 400 ug/h via INTRAVENOUS
  Filled 2014-05-11 (×5): qty 50

## 2014-05-11 MED ORDER — ROCURONIUM BROMIDE 50 MG/5ML IV SOLN
INTRAVENOUS | Status: DC | PRN
  Administered 2014-05-11: 70 mg via INTRAVENOUS

## 2014-05-11 MED ORDER — HEPARIN SODIUM (PORCINE) 5000 UNIT/ML IJ SOLN
5000.0000 [IU] | Freq: Three times a day (TID) | INTRAMUSCULAR | Status: DC
  Administered 2014-05-11 – 2014-05-15 (×11): 5000 [IU] via SUBCUTANEOUS
  Filled 2014-05-11 (×15): qty 1

## 2014-05-11 MED ORDER — VITAL HIGH PROTEIN PO LIQD
1000.0000 mL | ORAL | Status: DC
  Administered 2014-05-11: 1000 mL
  Filled 2014-05-11 (×2): qty 1000

## 2014-05-11 MED ORDER — CHLORHEXIDINE GLUCONATE 0.12 % MT SOLN
15.0000 mL | Freq: Two times a day (BID) | OROMUCOSAL | Status: DC
  Administered 2014-05-11 – 2014-05-15 (×8): 15 mL via OROMUCOSAL
  Filled 2014-05-11 (×10): qty 15

## 2014-05-11 NOTE — Sedation Documentation (Signed)
20 Etomidate

## 2014-05-11 NOTE — H&P (Addendum)
PULMONARY / CRITICAL CARE MEDICINE   Name: [name]. MRN: [id number] DOB: 1960/12/09    ADMISSION DATE:  05/10/2014  PRIMARY SERVICE: PCCM  CHIEF COMPLAINT:  Baclofen overdose, respiratory failure.   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 Presents after intentional Baclofen overdose. Developed AMS and respiratory failure requiring endotracheal intubation.   SIGNIFICANT EVENTS / STUDIES:  - Chest X ray: no acute infiltrates  LINES / TUBES: ETT 6/6>>> L [place] TLC 6/6>>> R radial a-line 6/6>>>  CULTURES: None  ANTIBIOTICS: None  SUBJECTIVE: Called by bedside RN, patient is severely hypotensive in the 40's.  VITAL SIGNS: Temp:  [93.5 F (34.2 C)-98.3 F (36.8 C)] 94 F (34.4 C) (06/06 0800) Pulse Rate:  [53-102] 65 (06/06 0700) Resp:  [11-19] 11 (06/06 0700) BP: (109-159)/(83-104) 118/91 mmHg (06/06 0700) SpO2:  [95 %-100 %] 100 % (06/06 0700) FiO2 (%):  [30 %-60 %] 30 % (06/06 0655) Weight:  [160 lb 7.9 oz (72.8 kg)] 160 lb 7.9 oz (72.8 kg) (06/06 0655) HEMODYNAMICS:   VENTILATOR SETTINGS: Vent Mode:  [-] PRVC FiO2 (%):  [30 %-60 %] 30 % Set Rate:  [12 bmp-14 bmp] 12 bmp Vt Set:  [450 mL-570 mL] 500 mL PEEP:  [5 cmH20] 5 cmH20 Plateau Pressure:  [12 cmH20-13 cmH20] 13 cmH20 INTAKE / OUTPUT: Intake/Output     06/05 0701 - 06/06 0700 06/06 0701 - 06/07 0700   Urine (mL/kg/hr) 1200    Total Output 1200     Net -1200            PHYSICAL EXAMINATION: General: Sedated, intubated, no acute distress. Eyes: Anicteric sclerae. Pupils are equal and reactive to light. ENT: ETT in place. Trachea at midline.  Lymph: No cervical, supraclavicular, or axillary lymphadenopathy. Heart: Normal S1, S2. No murmurs, rubs, or gallops appreciated. No bruits, equal pulses. Lungs: Normal excursion, no dullness to percussion. Good air movement bilaterally,  without wheezes or crackles.  Abdomen: Abdomen soft, non-tender and not distended, normoactive bowel sounds. No hepatosplenomegaly or masses. Musculoskeletal: No clubbing or synovitis. No LE edema Skin: No rashes or lesions Neuro: Patient is unresponsive to verbal or painful stimuli. Paraplegic from nipples down. On Fentanyl drip.   LABS:  CBC  Recent Labs Lab 05/07/14 0915 05/10/14 2305  WBC 5.5 5.9  HGB 13.4 12.3*  HCT 42.2 38.9*  PLT 229 208   Coag's No results found for this basename: APTT, INR,  in the last 168 hours BMET  Recent Labs Lab 05/07/14 0915 05/10/14 2305  NA 140 138  K 3.5 3.9  CL 101 100  CO2 30 26  BUN 17 16  CREATININE 0.8 0.58  GLUCOSE 140* 188*   Electrolytes  Recent Labs Lab 05/07/14 0915 05/10/14 2305  CALCIUM 8.8 8.4   Sepsis Markers No results found for this basename: LATICACIDVEN, PROCALCITON, O2SATVEN,  in the last 168 hours ABG  Recent Labs Lab 05/11/14 0500  PHART 7.516*  PCO2ART 29.0*  PO2ART 303.0*   Liver Enzymes  Recent Labs Lab 05/07/14 0915  AST 29  ALT 21  ALKPHOS 153*  BILITOT 0.70   Cardiac Enzymes No results found for this basename: TROPONINI, PROBNP,  in the last 168 hours Glucose  Recent Labs Lab 05/11/14 0155 05/11/14 0338 05/11/14 0747  GLUCAP 129* 117* 137*   Imaging Dg Chest Port 1 View  05/11/2014   CLINICAL DATA:  Central line placement.  EXAM:  PORTABLE CHEST - 1 VIEW  COMPARISON:  05/11/2014 4:01 a.m.  FINDINGS: Endotracheal tube has tip 5.8 cm above the carina. Nasogastric tube has been placed and courses into the region of the stomach and off the inferior portion of the film. There has been placement of a left subclavian central venous catheter which has tip over the region of the SVC at the level of the carina. Lungs are adequately inflated without focal consolidation or effusion. There is no evidence of left-sided pneumothorax. There is stable borderline cardiomegaly. Remainder the exam is  unchanged.  IMPRESSION: No acute cardiopulmonary disease.  Tubes and lines as described.   Electronically Signed   By: Marin Olp M.D.   On: 05/11/2014 08:57   Dg Chest Portable 1 View  05/11/2014   CLINICAL DATA:  Intubation.  EXAM: PORTABLE CHEST - 1 VIEW  COMPARISON:  Chest CT 04/11/2014.  FINDINGS: The endotracheal tube is 6 cm above the carina. The heart is mildly enlarged but stable. The lungs are grossly clear.  IMPRESSION: The endotracheal tube is 6 cm above the carina.  No significant pulmonary findings.   Electronically Signed   By: Kalman Jewels M.D.   On: 05/11/2014 04:22   CXR:  No parenchymal infiltrates. ETT in adequate position.  ASSESSMENT / PLAN:  PULMONARY A: 1) Acute respiratory failure secondary to Baclofen overdose P:   - Mechanical ventilation, full support, no weaning for now. - PRVC, Vt: 7cc/kg, PEEP: 5, RR: 12, FiO2: 100% and adjust to keep O2 sat > 94%, decrease RR to 10. - VAP prevention order set. - Daily awakening and SBT. - Albuterol PRN. - Place a-line 6/6.  CARDIOVASCULAR A:  1) Hemodynamically stable P:  - Restart home midodrine. - Dopamine for BP support. - CVP monitoring.  RENAL A:   1) No acute issues P:   - Will follow chemistry. - Hold diureses for now. - Replace electrolytes as indicated.  GASTROINTESTINAL A:   1) No issues P:   - GI prophylaxis with IV protonix - Consult nutrition for TF.  HEMATOLOGIC A:   1) No issues P:  - DVT prophylaxis with SQ heparin and SCD's  INFECTIOUS A:   1) No issues P:   - No antibiotics  ENDOCRINE A:   1) DM post pancreatectomy P:   - Novolog sliding scale per ICU hyperglycemia protocol - Usually gets lantus in am. We may resume in am once he is getting nutrition.  - Stress dose steroids and cortisol level.  D/C if cortisol level is normal.  NEUROLOGIC A:   1) AMS secondary to baclofen overdose. 2) Von Hippel-Lindau syndrome with metastatic neuroendocrine tumors to liver,  kidneys, left eye and spinal cord P:   RASS goal: 0 - Fentanyl drip. - Versed PRN.  TODAY'S SUMMARY: Hypothermic, hypotensive and bradycardic this AM, will place TLC and start dopamine, restart home dose of midodrine and stress dose steroids.  Spoke with wife at length, after discussion, decision was made to make patient a LCB with no CPR and no cardioversion but full care otherwise.  I have personally obtained a history, examined the patient, evaluated laboratory and imaging results, formulated the assessment and plan and placed orders.  CRITICAL CARE: Critical Care Time devoted to patient care services described in this note is 60 minutes.   [name], M.D. [facility] Pulmonary/Critical Care Medicine. Pager: [phone]. After hours pager: [phone].

## 2014-05-11 NOTE — Procedures (Signed)
Central Venous Catheter Insertion Procedure Note [name] [id number] Apr 13, 1961  Procedure: Insertion of Central Venous Catheter Indications: Assessment of intravascular volume, Drug and/or fluid administration and Frequent blood sampling  Procedure Details Consent: Risks of procedure as well as the alternatives and risks of each were explained to the (patient/caregiver).  Consent for procedure obtained. Time Out: Verified patient identification, verified procedure, site/side was marked, verified correct patient position, special equipment/implants available, medications/allergies/relevent history reviewed, required imaging and test results available.  Performed  Maximum sterile technique was used including antiseptics, cap, gloves, gown, hand hygiene, mask and sheet. Skin prep: Chlorhexidine; local anesthetic administered A antimicrobial bonded/coated triple lumen catheter was placed in the left subclavian vein using the Seldinger technique.  Evaluation Blood flow good Complications: No apparent complications Patient did tolerate procedure well. Chest X-ray ordered to verify placement.  CXR: normal.  U/S used in placement.  [name] 05/11/2014, 10:00 AM

## 2014-05-11 NOTE — ED Notes (Signed)
Pt became unresponsive,  No gag reflex,  Pt moved to res A  And respiratory at bedside to intubate at 0341
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
## 2014-05-11 NOTE — ED Notes (Signed)
David at [facility] said pt can be discharged at any time if not symptomatic,  Pt is alert and oriented in NAD.

## 2014-05-11 NOTE — Sedation Documentation (Signed)
Roc 10 IV  For intubation

## 2014-05-11 NOTE — Progress Notes (Signed)
Brief Nutrition Note  Consult received for enteral/tube feeding initiation and management.  Adult Enteral Nutrition Protocol initiated. Full assessment to follow.  Admitting Dx: Acute respiratory failure [518.81] Altered mental status [780.97] Drug overdose, intentional [977.9, E980.5]  Body mass index is 23.69 kg/(m^2). Pt meets criteria for normal weight based on current BMI.  Labs:   Recent Labs Lab 05/07/14 0915 05/10/14 2305  NA 140 138  K 3.5 3.9  CL 101 100  CO2 30 26  BUN 17 16  CREATININE 0.8 0.58  CALCIUM 8.8 8.4  GLUCOSE 140* 188*    Molli Barrows, RD, LDN, CNSC Pager [phone] After Hours Pager [phone]

## 2014-05-11 NOTE — ED Notes (Signed)
Pt is diaphoretic,  To point of bed linens and gown are drenched in sweat,  Dr Nuala Alpha at bedside,  Pt ordered another liter of Normal Saline,  This is 2nd liter.  Pt is now mumbling and seems to get agitated at questions.  Sitter continues to be at bedside

## 2014-05-11 NOTE — Procedures (Signed)
Arterial Catheter Insertion Procedure Note [name] [id number] 1960/12/31  Procedure: Insertion of Arterial Catheter  Indications: Blood pressure monitoring and Frequent blood sampling  Procedure Details Consent: Risks of procedure as well as the alternatives and risks of each were explained to the (patient/caregiver).  Consent for procedure obtained. Time Out: Verified patient identification, verified procedure, site/side was marked, verified correct patient position, special equipment/implants available, medications/allergies/relevent history reviewed, required imaging and test results available.  Performed  Maximum sterile technique was used including antiseptics, cap, gloves, gown, hand hygiene, mask and sheet. Skin prep: Chlorhexidine; local anesthetic administered 20 gauge catheter was inserted into right radial artery using the Seldinger technique.  Evaluation Blood flow good; BP tracing good. Complications: No apparent complications.   [name] 05/11/2014

## 2014-05-11 NOTE — Progress Notes (Signed)
Pt transported from ED to ICU without incident.  RT to monitor and assess as needed.

## 2014-05-11 NOTE — ED Notes (Signed)
Seizure pads at bedside.

## 2014-05-11 NOTE — H&P (Signed)
PULMONARY / CRITICAL CARE MEDICINE   Name: [name]. MRN: [id number] DOB: [birth year]    ADMISSION DATE:  05/10/2014  PRIMARY SERVICE: PCCM  CHIEF COMPLAINT:  Baclofen overdose, respiratory failure.   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 Presents after intentional Baclofen overdose. Developed AMS and respiratory failure requiring endotracheal intubation.   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 Has undergone partial nephrectomy and pancreatectomy, chronically on insulin. Also has history of left eye enucleation. Patient of Dr. Marin Olp. Presents today after ingesting approximately 20 tablets of Baclofen, (witnessed by his wife). No other ingestions. Initially in the ED stable and alert but then developed AMS, apneas and respiratory failure. First suicidal attempt, not planned, was spontaneous in the setting of an argument with his wife. At the time of my examination the patient is intubated, sedated, saturating 100% on 40% FiO2 and hemodynamically stable.   PAST MEDICAL HISTORY :  Past Medical History  Diagnosis Date  . 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 Diabetes mellitus without complication   . 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 Tumor of optic nerve     right -"stable"  . Headache(784.0)     neuropathic pain usually behind eyes related to blood pressure  . Transfusion history     '09 .    Past Surgical History  Procedure Laterality Date  . Back surgery      '91- T#-T5 "tumor resection"-benign  . Reconstruction breast w/ tram flap    . Muscle flap  myocutaneous / fasciocutaneous of trunk Left     '01  . Craniotomy for tumor      medulla tumor "benign tumor"-salvage own bone for closure  . Peripherally inserted central catheter insertion      past hx.  . Enucleation Left     '05 with prosthesis  . Cervical spine surgery      '07- resection tumor C5-C7-"benign"  . Partial nephrectomy Right     '09- cancer  . Pancreas surgery      '09- with partial liver resection/ pancreas "Whipple"  . 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 Insertion of suprapubic catheter N/A 02/04/2014    Procedure: INSERTION OF SUPRAPUBIC CATHETER;  Surgeon: Franchot Gallo, MD;  Location: [facility];  Service: Urology;  Laterality: N/A;  . Cystoscopy with litholapaxy N/A 02/04/2014    Procedure: CYSTOSCOPY WITH LITHOLAPAXY;  Surgeon: Franchot Gallo, MD;  Location: [facility];  Service: Urology;  Laterality: N/A;   Prior to Admission medications   Medication Sig Start Date End Date Taking? Authorizing Provider  baclofen (LIORESAL) 20 MG tablet Take 1-2 tablets (20-40 mg total) by mouth 5 (five) times daily. TAKES 1 TABLET  4 TIMES DAILY AND 2 AT BEDTIME 02/12/14  Yes Volanda Napoleon, MD  diazepam (VALIUM) 5 MG tablet Take 5 mg by mouth every 6 (six) hours as needed for anxiety.   Yes Historical Provider, MD  HYDROcodone-acetaminophen (VICODIN) 5-500 MG per tablet Take 0.5-1 tablets by mouth every 6 (six) hours as needed for pain. 02/12/14  Yes Collier Salina  Oletha Cruel, MD  insulin glargine (LANTUS) 100 UNIT/ML injection Inject 25 Units into the skin daily before breakfast.    Yes Historical Provider, MD  insulin lispro (HUMALOG) 100 UNIT/ML injection Inject 3-9 Units into the skin 3 (three) times daily before meals. SS   Yes Historical Provider, MD  midodrine (PROAMATINE) 5 MG tablet Take 10 mg by mouth as needed.    Yes Historical Provider, MD  Pancrelipase, Lip-Prot-Amyl, 24000 UNITS CPEP Take 3 capsules by mouth 3 (three) times daily with meals.    Yes Historical Provider, MD  promethazine (PHENERGAN) 12.5 MG tablet Take 12.5 mg by mouth every 6 (six) hours as needed for nausea.    Yes Historical Provider, MD  solifenacin (VESICARE) 10 MG tablet Take 10 mg by mouth daily.    Yes Historical Provider, MD  tiZANidine (ZANAFLEX) 4 MG tablet Take 4 mg by mouth every 6 (six) hours as needed for muscle spasms.   Yes Historical Provider, MD  traMADol (ULTRAM) 50 MG tablet Take 50 mg by mouth 3 (three) times daily.   Yes Historical Provider, MD   Allergies  Allergen Reactions  . Ciprocin-Fluocin-Procin [Fluocinolone Acetonide] Rash    Pt states this happened with IV Cipro. ABLE TO TAKE PO CIPRO.    FAMILY HISTORY:  No family history on file. SOCIAL HISTORY:  reports that he quit smoking about 5 years ago. His smoking use included Cigarettes. He started smoking about 15 years ago. He has a 3 pack-year smoking history. He has never used smokeless tobacco. He reports that he drinks alcohol. He reports that he does not use illicit drugs.  REVIEW OF SYSTEMS:  Unable to provide  SUBJECTIVE:   VITAL SIGNS: Temp:  [98.3 F (36.8 C)] 98.3 F (36.8 C) (06/05 2241) Pulse Rate:  [53-102] 56 (06/06 0529) Resp:  [11-19] 14 (06/06 0529) BP: (109-159)/(83-95) 149/94 mmHg (06/06 0529) SpO2:  [95 %-100 %] 100 % (06/06 0529) FiO2 (%):  [30 %-60 %] 30 % (06/06 0529) HEMODYNAMICS:   VENTILATOR SETTINGS: Vent Mode:  [-] PRVC FiO2 (%):  [30 %-60 %] 30 % Set Rate:  [14 bmp] 14 bmp Vt Set:  [450 mL-570 mL] 450 mL PEEP:  [5 cmH20] 5 cmH20 Plateau Pressure:  [12 cmH20] 12 cmH20 INTAKE / OUTPUT: Intake/Output   None     PHYSICAL EXAMINATION: General: Sedated, intubated, no acute distress. Eyes: Anicteric sclerae. Pupils are equal and reactive to light. ENT: ETT in place. Trachea at midline.  Lymph: No cervical, supraclavicular, or axillary lymphadenopathy. Heart: Normal S1, S2. No murmurs, rubs, or gallops appreciated. No bruits, equal  pulses. Lungs: Normal excursion, no dullness to percussion. Good air movement bilaterally, without wheezes or crackles.  Abdomen: Abdomen soft, non-tender and not distended, normoactive bowel sounds. No hepatosplenomegaly or masses. Musculoskeletal: No clubbing or synovitis. No LE edema Skin: No rashes or lesions Neuro: Patient is unresponsive to verbal or painful stimuli. Paraplegic from nipples down. On Fentanyl drip.    LABS:  CBC  Recent Labs Lab 05/07/14 0915 05/10/14 2305  WBC 5.5 5.9  HGB 13.4 12.3*  HCT 42.2 38.9*  PLT 229 208   Coag's No results found for this basename: APTT, INR,  in the last 168 hours BMET  Recent Labs Lab 05/07/14 0915 05/10/14 2305  NA 140 138  K 3.5 3.9  CL 101 100  CO2 30 26  BUN 17 16  CREATININE 0.8 0.58  GLUCOSE 140* 188*   Electrolytes  Recent Labs Lab 05/07/14 0915 05/10/14  2305  CALCIUM 8.8 8.4   Sepsis Markers No results found for this basename: LATICACIDVEN, PROCALCITON, O2SATVEN,  in the last 168 hours ABG  Recent Labs Lab 05/11/14 0500  PHART 7.516*  PCO2ART 29.0*  PO2ART 303.0*   Liver Enzymes  Recent Labs Lab 05/07/14 0915  AST 29  ALT 21  ALKPHOS 153*  BILITOT 0.70   Cardiac Enzymes No results found for this basename: TROPONINI, PROBNP,  in the last 168 hours Glucose  Recent Labs Lab 05/11/14 0155 05/11/14 0338  GLUCAP 129* 117*    Imaging Dg Chest Portable 1 View  05/11/2014   CLINICAL DATA:  Intubation.  EXAM: PORTABLE CHEST - 1 VIEW  COMPARISON:  Chest CT 04/11/2014.  FINDINGS: The endotracheal tube is 6 cm above the carina. The heart is mildly enlarged but stable. The lungs are grossly clear.  IMPRESSION: The endotracheal tube is 6 cm above the carina.  No significant pulmonary findings.   Electronically Signed   By: Kalman Jewels M.D.   On: 05/11/2014 04:22     CXR:  No parenchymal infiltrates. ETT in adequate position.  ASSESSMENT / PLAN:  PULMONARY A: 1) Acute respiratory  failure secondary to Baclofen overdose P:   - Mechanical ventilation   - PRVC, Vt: 7cc/kg, PEEP: 5, RR: 12, FiO2: 100% and adjust to keep O2 sat > 94%   - VAP prevention order set   - Daily awakening and SBT - Albuterol PRN  CARDIOVASCULAR A:  1) Hemodynamically stable P:  - Home midodrine on hold. Will restart in am  RENAL A:   1) No acute issues P:   - Will follow chemistry  GASTROINTESTINAL A:   1) No issues P:   - GI prophylaxis with IV protonix  HEMATOLOGIC A:   1) No issues P:  - DVT prophylaxis with SQ heparin and SCD's  INFECTIOUS A:   1) No issues P:   - No antibiotics  ENDOCRINE A:   1) DM post pancreatectomy P:   - Novolog sliding scale per ICU hyperglycemia protocol - Usually gets lantus in am. We may resume in am once he is getting nutrition.   NEUROLOGIC A:   1) AMS secondary to baclofen overdose. 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 CRITICAL CARE: Critical Care Time devoted to patient care services described in this note is 60 minutes.   Waynetta Pean, MD Pulmonary and [place] Pager: [phone]  05/11/2014, 6:11 AM

## 2014-05-11 NOTE — ED Notes (Signed)
Changed pt's suprapubic catheter bag.

## 2014-05-12 ENCOUNTER — Inpatient Hospital Stay (HOSPITAL_COMMUNITY): Payer: 59

## 2014-05-12 DIAGNOSIS — I959 Hypotension, unspecified: Secondary | ICD-10-CM

## 2014-05-12 DIAGNOSIS — T48204A Poisoning by unspecified drugs acting on muscles, undetermined, initial encounter: Secondary | ICD-10-CM

## 2014-05-12 DIAGNOSIS — T50901A Poisoning by unspecified drugs, medicaments and biological substances, accidental (unintentional), initial encounter: Secondary | ICD-10-CM

## 2014-05-12 DIAGNOSIS — R918 Other nonspecific abnormal finding of lung field: Secondary | ICD-10-CM | POA: Diagnosis not present

## 2014-05-12 DIAGNOSIS — R4182 Altered mental status, unspecified: Secondary | ICD-10-CM

## 2014-05-12 DIAGNOSIS — T50904A Poisoning by unspecified drugs, medicaments and biological substances, undetermined, initial encounter: Secondary | ICD-10-CM

## 2014-05-12 LAB — BASIC METABOLIC PANEL
BUN: 18 mg/dL (ref 6–23)
CALCIUM: 8.7 mg/dL (ref 8.4–10.5)
CO2: 25 mEq/L (ref 19–32)
Chloride: 104 mEq/L (ref 96–112)
Creatinine, Ser: 0.55 mg/dL (ref 0.50–1.35)
GFR calc Af Amer: >90 mL/min (ref >90)
GLUCOSE: 222 mg/dL — AB (ref 70–99)
Potassium: 4.1 mEq/L (ref 3.7–5.3)
SODIUM: 142 meq/L (ref 137–147)

## 2014-05-12 LAB — CBC
HEMATOCRIT: 38.7 % — AB (ref 39.0–52.0)
Hemoglobin: 12.3 g/dL — ABNORMAL LOW (ref 13.0–17.0)
MCH: 25.2 pg — AB (ref 26.0–34.0)
MCHC: 31.8 g/dL (ref 30.0–36.0)
MCV: 79.3 fL (ref 78.0–100.0)
Platelets: 194 10*3/uL (ref 150–400)
RBC: 4.88 MIL/uL (ref 4.22–5.81)
RDW: 14.7 % (ref 11.5–15.5)
WBC: 9 10*3/uL (ref 4.0–10.5)

## 2014-05-12 LAB — BLOOD GAS, ARTERIAL
Acid-Base Excess: 1.1 mmol/L (ref 0.0–2.0)
Bicarbonate: 26.4 mEq/L — ABNORMAL HIGH (ref 20.0–24.0)
Drawn by: 308601
FIO2: 0.3 %
O2 Saturation: 97.9 %
PEEP: 5 cmH2O
PH ART: 7.367 (ref 7.350–7.450)
Patient temperature: 37
RATE: 10 resp/min
TCO2: 23.8 mmol/L (ref 0–100)
VT: 500 mL
pCO2 arterial: 47.2 mmHg — ABNORMAL HIGH (ref 35.0–45.0)
pO2, Arterial: 129 mmHg — ABNORMAL HIGH (ref 80.0–100.0)

## 2014-05-12 LAB — GLUCOSE, CAPILLARY
GLUCOSE-CAPILLARY: 144 mg/dL — AB (ref 70–99)
GLUCOSE-CAPILLARY: 166 mg/dL — AB (ref 70–99)
GLUCOSE-CAPILLARY: 217 mg/dL — AB (ref 70–99)
Glucose-Capillary: 158 mg/dL — ABNORMAL HIGH (ref 70–99)
Glucose-Capillary: 189 mg/dL — ABNORMAL HIGH (ref 70–99)
Glucose-Capillary: 175 mg/dL — ABNORMAL HIGH (ref 70–99)
Glucose-Capillary: 174 mg/dL — ABNORMAL HIGH (ref 70–99)

## 2014-05-12 LAB — PHOSPHORUS: Phosphorus: 4.5 mg/dL (ref 2.3–4.6)

## 2014-05-12 LAB — LACTIC ACID, PLASMA: Lactic Acid, Venous: 1.4 mmol/L (ref 0.5–2.2)

## 2014-05-12 LAB — MAGNESIUM: Magnesium: 2.3 mg/dL (ref 1.5–2.5)

## 2014-05-12 MED ORDER — VITAL AF 1.2 CAL PO LIQD
1000.0000 mL | ORAL | Status: DC
  Administered 2014-05-12 – 2014-05-13 (×2): 1000 mL
  Filled 2014-05-12 (×2): qty 1000

## 2014-05-12 NOTE — Progress Notes (Signed)
PULMONARY / CRITICAL CARE MEDICINE   Name: [name]. MRN: [id number] DOB: 02-Jan-1961    ADMISSION DATE:  05/10/2014  PRIMARY SERVICE: PCCM  CHIEF COMPLAINT:  Baclofen overdose, respiratory failure.   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 Presents after intentional Baclofen overdose. Developed AMS and respiratory failure requiring endotracheal intubation.   SIGNIFICANT EVENTS / STUDIES:  - Chest X ray: no acute infiltrates  LINES / TUBES: ETT 6/6>>> L [place] TLC 6/6>>> R radial a-line 6/6>>>  CULTURES: None  ANTIBIOTICS: None  SUBJECTIVE: Was following commands yesterday but completely sedate today.  VITAL SIGNS: Temp:  [96.1 F (35.6 C)-98.6 F (37 C)] 98.6 F (37 C) (06/07 0330) Pulse Rate:  [51-128] 54 (06/07 0800) Resp:  [10-13] 10 (06/07 0800) BP: (67-168)/(42-91) 128/66 mmHg (06/07 0800) SpO2:  [97 %-100 %] 99 % (06/07 0800) Arterial Line BP: (70-188)/(48-97) 135/75 mmHg (06/07 0800) FiO2 (%):  [30 %] 30 % (06/07 0816) Weight:  [161 lb 6 oz (73.2 kg)] 161 lb 6 oz (73.2 kg) (06/07 0500) HEMODYNAMICS: CVP:  [0 mmHg-9 mmHg] 9 mmHg VENTILATOR SETTINGS: Vent Mode:  [-] PRVC FiO2 (%):  [30 %] 30 % Set Rate:  [10 bmp] 10 bmp Vt Set:  [500 mL] 500 mL PEEP:  [5 cmH20] 5 cmH20 Plateau Pressure:  [12 cmH20-13 cmH20] 12 cmH20 INTAKE / OUTPUT: Intake/Output     06/06 0701 - 06/07 0700 06/07 0701 - 06/08 0700   I.V. (mL/kg) 1325.7 (18.1) 78.9 (1.1)   Other 500    NG/GT 380 100   Total Intake(mL/kg) 2205.7 (30.1) 178.9 (2.4)   Urine (mL/kg/hr) 3300 (1.9)    Total Output 3300     Net -1094.3 +178.9         PHYSICAL EXAMINATION: General: Sedated, intubated, no acute distress. Eyes: Anicteric sclerae. Pupils are equal and reactive to light. ENT: ETT in place. Trachea at midline.  Lymph: No cervical, supraclavicular, or axillary  lymphadenopathy. Heart: Normal S1, S2. No murmurs, rubs, or gallops appreciated. No bruits, equal pulses. Lungs: Normal excursion, no dullness to percussion. Good air movement bilaterally, without wheezes or crackles.  Abdomen: Abdomen soft, non-tender and not distended, normoactive bowel sounds. No hepatosplenomegaly or masses. Musculoskeletal: No clubbing or synovitis. No LE edema Skin: No rashes or lesions Neuro: Patient is unresponsive to verbal or painful stimuli. Paraplegic from nipples down. On Fentanyl drip.   LABS:  CBC  Recent Labs Lab 05/07/14 0915 05/10/14 2305 05/12/14 0525  WBC 5.5 5.9 9.0  HGB 13.4 12.3* 12.3*  HCT 42.2 38.9* 38.7*  PLT 229 208 194   Coag's No results found for this basename: APTT, INR,  in the last 168 hours BMET  Recent Labs Lab 05/07/14 0915 05/10/14 2305 05/12/14 0525  NA 140 138 142  K 3.5 3.9 4.1  CL 101 100 104  CO2 30 26 25   BUN 17 16 18   CREATININE 0.8 0.58 0.55  GLUCOSE 140* 188* 222*   Electrolytes  Recent Labs Lab 05/07/14 0915 05/10/14 2305 05/12/14 0525  CALCIUM 8.8 8.4 8.7  MG  --   --  2.3  PHOS  --   --  4.5   Sepsis Markers  Recent Labs Lab 05/12/14 0530  LATICACIDVEN 1.4   ABG  Recent Labs Lab 05/11/14 0500 05/12/14 0418  PHART 7.516* 7.367  PCO2ART 29.0* 47.2*  PO2ART 303.0* 129.0*   Liver Enzymes  Recent Labs Lab  05/07/14 0915  AST 29  ALT 21  ALKPHOS 153*  BILITOT 0.70   Cardiac Enzymes No results found for this basename: TROPONINI, PROBNP,  in the last 168 hours Glucose  Recent Labs Lab 05/11/14 0747 05/11/14 1201 05/11/14 1645 05/11/14 2033 05/12/14 0001 05/12/14 0739  GLUCAP 137* 118* 149* 156* 166* 189*   Imaging Dg Chest Port 1 View  05/12/2014   CLINICAL DATA:  Intubated.  EXAM: PORTABLE CHEST - 1 VIEW  COMPARISON:  05/11/2014.  FINDINGS: Of the endotracheal tube and NG tubes are in good position. The left subclavian central venous catheter is unchanged. The cardiac  silhouette, mediastinal and hilar contours are within normal limits. The lungs are clear. No pleural effusion or pneumothorax.  IMPRESSION: Stable support apparatus.  No acute pulmonary findings.   Electronically Signed   By: Kalman Jewels M.D.   On: 05/12/2014 06:09   Dg Chest Port 1 View  05/11/2014   CLINICAL DATA:  Central line placement.  EXAM: PORTABLE CHEST - 1 VIEW  COMPARISON:  05/11/2014 4:01 a.m.  FINDINGS: Endotracheal tube has tip 5.8 cm above the carina. Nasogastric tube has been placed and courses into the region of the stomach and off the inferior portion of the film. There has been placement of a left subclavian central venous catheter which has tip over the region of the SVC at the level of the carina. Lungs are adequately inflated without focal consolidation or effusion. There is no evidence of left-sided pneumothorax. There is stable borderline cardiomegaly. Remainder the exam is unchanged.  IMPRESSION: No acute cardiopulmonary disease.  Tubes and lines as described.   Electronically Signed   By: Marin Olp M.D.   On: 05/11/2014 08:57   Dg Chest Portable 1 View  05/11/2014   CLINICAL DATA:  Intubation.  EXAM: PORTABLE CHEST - 1 VIEW  COMPARISON:  Chest CT 04/11/2014.  FINDINGS: The endotracheal tube is 6 cm above the carina. The heart is mildly enlarged but stable. The lungs are grossly clear.  IMPRESSION: The endotracheal tube is 6 cm above the carina.  No significant pulmonary findings.   Electronically Signed   By: Kalman Jewels M.D.   On: 05/11/2014 04:22   CXR:  No parenchymal infiltrates. ETT in adequate position.  ASSESSMENT / PLAN:  PULMONARY A: 1) Acute respiratory failure secondary to Baclofen overdose P:   - Lighten sedation and begin PS trials today. - Hoping that once awake will be able to perform an SBT and hopefully extubate today. - VAP prevention order set. - Daily awakening and SBT. - Albuterol PRN. - Placed a-line 6/6.  CARDIOVASCULAR A:  1)  Hemodynamically stable P:  - Continue home midodrine. - Dopamine for BP support. - CVP monitoring.  RENAL A:   1) No acute issues P:   - Will follow chemistry. - Hold diureses for now. - Replace electrolytes as indicated.  GASTROINTESTINAL A:   1) No issues P:   - GI prophylaxis with IV protonix - Continue TF but hold if we are to extubate.  HEMATOLOGIC A:   1) No issues P:  - DVT prophylaxis with SQ heparin and SCD's  INFECTIOUS A:   1) No issues P:   - No antibiotics  ENDOCRINE A:   1) DM post pancreatectomy P:   - Novolog sliding scale per ICU hyperglycemia protocol - Usually gets lantus but will hold for now incase we hold TF for extubation.  - Continue tress dose steroids as cortisol level is 5.5.  NEUROLOGIC A:   1) AMS secondary to baclofen overdose. 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 - Versed PRN.  TODAY'S SUMMARY: Vitals much more stable today with dopamine on board, continue stress dose steroids, if more awake then will begin PS trials, hopefully be able to extubate today but more likely to extubate tomorrow.  I have personally obtained a history, examined the patient, evaluated laboratory and imaging results, formulated the assessment and plan and placed orders.  CRITICAL CARE: Critical Care Time devoted to patient care services described in this note is 35 minutes.   [name], M.D. [facility] Pulmonary/Critical Care Medicine. Pager: [phone]. After hours pager: [phone].

## 2014-05-12 NOTE — Progress Notes (Signed)
INITIAL NUTRITION ASSESSMENT  DOCUMENTATION CODES Per approved criteria  -Not Applicable   INTERVENTION: - Change TF to Vital AF 1.2. Initiate at 35 ml/hr, increase by 10 ml/hr every 4 hours to goal rate of 65 ml/hr. This will provide 1872 kcal, 117 g protein, 1265 ml free water.   NUTRITION DIAGNOSIS: Inadequate oral intake related to inability to eat as evidenced by NPO status  Goal: Patient will meet >/=90% of estimated nutrition needs  Monitor:  TF advancement and tolerance, respiratory status, weight, labs  Reason for Assessment: Consult, TF  53 y.o. 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 Presents after intentional baclofen overdose.   -Patient is intubated. Tube feeding initiated per Adult Enteral Nutrition Protocol.  -MV: 10.2 -Temp: 37 degrees -Vital High Protein running at 40 ml/hr.  -Noted, possible extubation today, but patient remains intubated with plans to continue TF per RN.   Height: Ht Readings from Last 1 Encounters:  05/11/14 6\' 2"  (1.88 m)    Weight: Wt Readings from Last 1 Encounters:  05/12/14 161 lb 6 oz (73.2 kg)    Ideal Body Weight: 190 pounds  % Ideal Body Weight: 85%  Wt Readings from Last 10 Encounters:  05/12/14 161 lb 6 oz (73.2 kg)  02/04/14 150 lb (68.04 kg)  02/04/14 150 lb (68.04 kg)  04/11/13 150 lb (68.04 kg)  06/22/11 145 lb (65.772 kg)    Usual Body Weight: Unknown  % Usual Body Weight:   BMI:  Body mass index is 20.71 kg/(m^2). Patient is normal weight   Estimated Nutritional Needs: Kcal: 1878 kcal Protein: 95-110 g Fluid: >2.5 L/day  Skin: Intact  Diet Order: NPO  EDUCATION NEEDS: -No education needs identified at this time   Intake/Output Summary (Last 24 hours) at 05/12/14 1535 Last data filed at 05/12/14 1400  Gross per 24 hour  Intake 1884.15 ml  Output   3695  ml  Net -1810.85 ml    Last BM: PTA   Labs:   Recent Labs Lab 05/07/14 0915 05/10/14 2305 05/12/14 0525  NA 140 138 142  K 3.5 3.9 4.1  CL 101 100 104  CO2 30 26 25   BUN 17 16 18   CREATININE 0.8 0.58 0.55  CALCIUM 8.8 8.4 8.7  MG  --   --  2.3  PHOS  --   --  4.5  GLUCOSE 140* 188* 222*    CBG (last 3)   Recent Labs  05/12/14 0739 05/12/14 1107 05/12/14 1455  GLUCAP 189* 217* 175*    Scheduled Meds: . antiseptic oral rinse  15 mL Mouth Rinse QID  . chlorhexidine  15 mL Mouth Rinse BID  . feeding supplement (VITAL HIGH PROTEIN)  1,000 mL Per Tube Q24H  . heparin  5,000 Units Subcutaneous 3 times per day  . hydrocortisone sodium succinate  50 mg Intravenous Q6H  . insulin aspart  2-6 Units Subcutaneous 6 times per day  . midodrine  10 mg Oral TID WC  . pantoprazole (PROTONIX) IV  40 mg Intravenous QHS    Continuous Infusions: . sodium chloride 500 mL (05/11/14 0853)  . DOPamine 2 mcg/kg/min (05/12/14 1307)  . fentaNYL infusion INTRAVENOUS 100 mcg/hr (05/12/14 1101)  . midazolam (VERSED) infusion 6 mg/hr (05/12/14 1250)  . norepinephrine (LEVOPHED) Adult infusion Stopped (05/11/14 0845)    Past Medical History  Diagnosis Date  . Von Hippel-Lindau syndrome  genetic disease that causes tumors  . Diabetes mellitus without complication   . 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 Tumor of optic nerve     right -"stable"  . Headache(784.0)     neuropathic pain usually behind eyes related to blood pressure  . Transfusion history     '09 .     Past Surgical History  Procedure Laterality Date  . Back surgery      '91- T#-T5 "tumor resection"-benign  . Reconstruction breast w/ tram flap    . Muscle flap myocutaneous / fasciocutaneous of trunk Left     '01  . Craniotomy for tumor      medulla tumor "benign tumor"-salvage own bone for closure  . Peripherally inserted central catheter insertion      past hx.  . Enucleation Left     '05 with  prosthesis  . Cervical spine surgery      '07- resection tumor C5-C7-"benign"  . Partial nephrectomy Right     '09- cancer  . Pancreas surgery      '09- with partial liver resection/ pancreas "[place]"  . 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 Insertion of suprapubic catheter N/A 02/04/2014    Procedure: INSERTION OF SUPRAPUBIC CATHETER;  Surgeon: Franchot Gallo, MD;  Location: [facility];  Service: Urology;  Laterality: N/A;  . Cystoscopy with litholapaxy N/A 02/04/2014    Procedure: CYSTOSCOPY WITH LITHOLAPAXY;  Surgeon: Franchot Gallo, MD;  Location: [facility];  Service: Urology;  Laterality: N/A;    Larey Seat, RD, LDN Pager #: [phone] After-Hours Pager #: [phone]

## 2014-05-13 ENCOUNTER — Inpatient Hospital Stay (HOSPITAL_COMMUNITY): Payer: 59

## 2014-05-13 ENCOUNTER — Encounter (HOSPITAL_COMMUNITY): Payer: Self-pay

## 2014-05-13 DIAGNOSIS — Z4682 Encounter for fitting and adjustment of non-vascular catheter: Secondary | ICD-10-CM | POA: Diagnosis not present

## 2014-05-13 DIAGNOSIS — J9690 Respiratory failure, unspecified, unspecified whether with hypoxia or hypercapnia: Secondary | ICD-10-CM | POA: Diagnosis present

## 2014-05-13 DIAGNOSIS — J9819 Other pulmonary collapse: Secondary | ICD-10-CM | POA: Diagnosis not present

## 2014-05-13 LAB — GLUCOSE, CAPILLARY
GLUCOSE-CAPILLARY: 170 mg/dL — AB (ref 70–99)
GLUCOSE-CAPILLARY: 239 mg/dL — AB (ref 70–99)
Glucose-Capillary: 227 mg/dL — ABNORMAL HIGH (ref 70–99)
Glucose-Capillary: 245 mg/dL — ABNORMAL HIGH (ref 70–99)
Glucose-Capillary: 221 mg/dL — ABNORMAL HIGH (ref 70–99)

## 2014-05-13 LAB — BLOOD GAS, ARTERIAL
Acid-base deficit: 0.5 mmol/L (ref 0.0–2.0)
Bicarbonate: 23.7 mEq/L (ref 20.0–24.0)
Drawn by: 308601
FIO2: 0.3 %
O2 Saturation: 97.5 %
PATIENT TEMPERATURE: 37
PEEP/CPAP: 5 cmH2O
PH ART: 7.394 (ref 7.350–7.450)
RATE: 10 resp/min
TCO2: 21.8 mmol/L (ref 0–100)
VT: 500 mL
pCO2 arterial: 39.6 mmHg (ref 35.0–45.0)
pO2, Arterial: 114 mmHg — ABNORMAL HIGH (ref 80.0–100.0)

## 2014-05-13 LAB — BASIC METABOLIC PANEL
BUN: 26 mg/dL — ABNORMAL HIGH (ref 6–23)
CALCIUM: 8.6 mg/dL (ref 8.4–10.5)
CO2: 27 meq/L (ref 19–32)
CREATININE: 0.6 mg/dL (ref 0.50–1.35)
Chloride: 105 mEq/L (ref 96–112)
GFR calc Af Amer: >90 mL/min (ref >90)
GFR calc non Af Amer: >90 mL/min (ref >90)
GLUCOSE: 293 mg/dL — AB (ref 70–99)
Potassium: 4.4 mEq/L (ref 3.7–5.3)
Sodium: 144 mEq/L (ref 137–147)

## 2014-05-13 LAB — CBC
HCT: 37.2 % — ABNORMAL LOW (ref 39.0–52.0)
HEMOGLOBIN: 11.2 g/dL — AB (ref 13.0–17.0)
MCH: 24.3 pg — AB (ref 26.0–34.0)
MCHC: 30.1 g/dL (ref 30.0–36.0)
MCV: 80.9 fL (ref 78.0–100.0)
Platelets: 167 10*3/uL (ref 150–400)
RBC: 4.6 MIL/uL (ref 4.22–5.81)
RDW: 15.2 % (ref 11.5–15.5)
WBC: 10.7 10*3/uL — ABNORMAL HIGH (ref 4.0–10.5)

## 2014-05-13 LAB — PHOSPHORUS: Phosphorus: 4.4 mg/dL (ref 2.3–4.6)

## 2014-05-13 LAB — MAGNESIUM: Magnesium: 2.4 mg/dL (ref 1.5–2.5)

## 2014-05-13 MED ORDER — INSULIN ASPART 100 UNIT/ML ~~LOC~~ SOLN
2.0000 [IU] | SUBCUTANEOUS | Status: DC
  Administered 2014-05-13: 6 [IU] via SUBCUTANEOUS
  Administered 2014-05-13: 4 [IU] via SUBCUTANEOUS
  Administered 2014-05-13: 6 [IU] via SUBCUTANEOUS
  Administered 2014-05-14: 2 [IU] via SUBCUTANEOUS
  Administered 2014-05-14 (×2): 4 [IU] via SUBCUTANEOUS

## 2014-05-13 MED ORDER — PANCRELIPASE (LIP-PROT-AMYL) 12000-38000 UNITS PO CPEP
6.0000 | ORAL_CAPSULE | Freq: Three times a day (TID) | ORAL | Status: DC
  Administered 2014-05-13 – 2014-05-15 (×6): 6 via ORAL
  Filled 2014-05-13 (×10): qty 6

## 2014-05-13 MED ORDER — BACLOFEN 10 MG PO TABS
10.0000 mg | ORAL_TABLET | Freq: Four times a day (QID) | ORAL | Status: DC
  Administered 2014-05-13 – 2014-05-15 (×9): 10 mg via ORAL
  Filled 2014-05-13 (×12): qty 1

## 2014-05-13 MED ORDER — HYDROCODONE-ACETAMINOPHEN 5-325 MG PO TABS
1.0000 | ORAL_TABLET | Freq: Four times a day (QID) | ORAL | Status: DC | PRN
  Administered 2014-05-13 – 2014-05-15 (×3): 2 via ORAL
  Filled 2014-05-13 (×3): qty 2

## 2014-05-13 MED ORDER — DARIFENACIN HYDROBROMIDE ER 15 MG PO TB24
15.0000 mg | ORAL_TABLET | Freq: Every day | ORAL | Status: DC
  Administered 2014-05-13 – 2014-05-15 (×3): 15 mg via ORAL
  Filled 2014-05-13 (×5): qty 1

## 2014-05-13 MED ORDER — ACETAMINOPHEN 325 MG PO TABS
650.0000 mg | ORAL_TABLET | Freq: Four times a day (QID) | ORAL | Status: DC | PRN
  Filled 2014-05-13: qty 2

## 2014-05-13 NOTE — Procedures (Signed)
Extubation Procedure Note  Patient Details:   Name: [name]. DOB: 1960-12-22 MRN: [id number]   Airway Documentation:     Evaluation  O2 sats: 96  Complications: No apparent complications Patient did tolerate procedure well. Bilateral Breath Sounds: Clear;Diminished Suctioning: Oral;Airway Yes. Pt is awake and can follow commands. Deep oral sxn done. Pt has positive cuff leak. Extubated pt to 4L [place] per MD. Pt tol well.   Constance Holster 05/13/2014, 1:22 PM

## 2014-05-13 NOTE — Progress Notes (Signed)
Wasted in sink 242ml Fentanyl IV and 61ml Versed IV. Witnessed by Emmaline Kluver, RN

## 2014-05-13 NOTE — Progress Notes (Signed)
PULMONARY / CRITICAL CARE MEDICINE   Name: [name]. MRN: [id number] DOB: [birth year]    ADMISSION DATE:  05/10/2014  PRIMARY SERVICE: PCCM  CHIEF COMPLAINT:  Baclofen overdose, respiratory failure.   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 Presents after intentional Baclofen overdose. Developed AMS and respiratory failure requiring endotracheal intubation.   SIGNIFICANT EVENTS: 6/05 Admit  STUDIES:   LINES / TUBES: ETT 6/6>>>6/08 L [place] TLC 6/6>>> R radial a-line 6/6>>>out  CULTURES: None  ANTIBIOTICS: None  SUBJECTIVE:  Awake and follows commands.  Tolerating SBT.  VITAL SIGNS: Temp:  [97.5 F (36.4 C)-99.9 F (37.7 C)] 98.8 F (37.1 C) (06/08 0800) Pulse Rate:  [50-130] 122 (06/08 0900) Resp:  [9-36] 9 (06/08 0900) BP: (89-180)/(52-99) 172/98 mmHg (06/08 0900) SpO2:  [92 %-100 %] 98 % (06/08 0900) Arterial Line BP: (70-190)/(43-124) 107/57 mmHg (06/08 0700) FiO2 (%):  [30 %] 30 % (06/08 0852) Weight:  [71.7 kg (158 lb 1.1 oz)] 71.7 kg (158 lb 1.1 oz) (06/08 0620) HEMODYNAMICS: CVP:  [7 mmHg-17 mmHg] 7 mmHg VENTILATOR SETTINGS: Vent Mode:  [-] CPAP;PSV FiO2 (%):  [30 %] 30 % Set Rate:  [10 bmp] 10 bmp Vt Set:  [500 mL] 500 mL PEEP:  [5 cmH20] 5 cmH20 Pressure Support:  [5 cmH20-10 cmH20] 5 cmH20 Plateau Pressure:  [7 cmH20-15 cmH20] 15 cmH20 INTAKE / OUTPUT: Intake/Output     06/07 0701 - 06/08 0700 06/08 0701 - 06/09 0700   I.V. (mL/kg) 2020.8 (28.2) 76.7 (1.1)   Other     NG/GT 851.7 130   Total Intake(mL/kg) 2872.5 (40.1) 206.7 (2.9)   Urine (mL/kg/hr) 1445 (0.8)    Total Output 1445     Net +1427.5 +206.7         PHYSICAL EXAMINATION: General: no distress HEENT: ETT in place  Heart: regular Lungs: No wheeze Abdomen: soft, non tender Musculoskeletal: No clubbing or synovitis. No LE edema Skin: No rashes or lesions Neuro:  Patient is responsive to verbal input and follows commands. Paraplegic from nipples down.  LABS:  CBC  Recent Labs Lab 05/10/14 2305 05/12/14 0525 05/13/14 0447  WBC 5.9 9.0 10.7*  HGB 12.3* 12.3* 11.2*  HCT 38.9* 38.7* 37.2*  PLT 208 194 167   BMET  Recent Labs Lab 05/10/14 2305 05/12/14 0525 05/13/14 0447  NA 138 142 144  K 3.9 4.1 4.4  CL 100 104 105  CO2 26 25 27   BUN 16 18 26*  CREATININE 0.58 0.55 0.60  GLUCOSE 188* 222* 293*   Electrolytes  Recent Labs Lab 05/10/14 2305 05/12/14 0525 05/13/14 0447  CALCIUM 8.4 8.7 8.6  MG  --  2.3 2.4  PHOS  --  4.5 4.4   Sepsis Markers  Recent Labs Lab 05/12/14 0530  LATICACIDVEN 1.4   ABG  Recent Labs Lab 05/11/14 0500 05/12/14 0418 05/13/14 0440  PHART 7.516* 7.367 7.394  PCO2ART 29.0* 47.2* 39.6  PO2ART 303.0* 129.0* 114.0*   Liver Enzymes  Recent Labs Lab 05/07/14 0915  AST 29  ALT 21  ALKPHOS 153*  BILITOT 0.70   Glucose  Recent Labs Lab 05/12/14 1107 05/12/14 1455 05/12/14 1951 05/12/14 2329 05/13/14 0357 05/13/14 0722  GLUCAP 217* 175* 144* 174* 227* 245*   Imaging Dg Chest Port 1 View  05/13/2014   CLINICAL DATA:  Intubation.  EXAM: PORTABLE CHEST - 1 VIEW  COMPARISON:  05/12/2014.  FINDINGS: Endotracheal tube  and NG tube in stable position. Left base atelectasis present. Cardiomegaly with normal pulmonary vascularity. No pleural effusion or pneumothorax. No acute osseous abnormality.  IMPRESSION: 1. Stable line and tube positions. 2. Left base subsegmental atelectasis . 3. Cardiomegaly, stable.   Electronically Signed   By: Marcello Moores  Register   On: 05/13/2014 07:22   Dg Chest Port 1 View  05/12/2014   CLINICAL DATA:  Intubated.  EXAM: PORTABLE CHEST - 1 VIEW  COMPARISON:  05/11/2014.  FINDINGS: Of the endotracheal tube and NG tubes are in good position. The left subclavian central venous catheter is unchanged. The cardiac silhouette, mediastinal and hilar contours are within normal  limits. The lungs are clear. No pleural effusion or pneumothorax.  IMPRESSION: Stable support apparatus.  No acute pulmonary findings.   Electronically Signed   By: Kalman Jewels M.D.   On: 05/12/2014 06:09   CXR:  No parenchymal infiltrates. ETT in adequate position.  ASSESSMENT / PLAN:  PULMONARY A: Acute respiratory failure secondary to Baclofen overdose P:   Proceed with extubation 6/08 F/u CXR as needed PRN BiPAP after extubation  CARDIOVASCULAR A:  Hemodynamically stable P:  Monitor hemodynamics  RENAL A:   No acute issues P:   Monitor renal fx, urine outpt, electrolytes Resume vesicare >> enablex while in hospital  GASTROINTESTINAL A:   Nutrition. S/p pancreatectomy. P:   Advance diet after extubation Pancreatic enzymes  HEMATOLOGIC A:   No issues. P:  F/u CBC intermittently  INFECTIOUS A:   No issues. P:   Monitor clinically  ENDOCRINE A:   DM post pancreatectomy. P:   SSI  NEUROLOGIC A:   AMS secondary to baclofen overdose. 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  Richardson Landry Minor ACNP Maryanna Shape PCCM Pager [phone] till 3 pm If no answer page [phone] 05/13/2014, 10:06 AM  Updated family at bedside.  CC time 35 minutes.  Chesley Mires, MD [facility] Pulmonary/Critical Care 05/13/2014, 11:30 AM Pager:  [phone] After 3pm call: [phone]

## 2014-05-13 NOTE — Progress Notes (Signed)
Fentanyl waste 230 ml and versed waste 40 ml

## 2014-05-14 ENCOUNTER — Inpatient Hospital Stay (HOSPITAL_COMMUNITY): Payer: 59

## 2014-05-14 DIAGNOSIS — J811 Chronic pulmonary edema: Secondary | ICD-10-CM | POA: Diagnosis not present

## 2014-05-14 LAB — BASIC METABOLIC PANEL
BUN: 25 mg/dL — ABNORMAL HIGH (ref 6–23)
CO2: 29 mEq/L (ref 19–32)
CREATININE: 0.62 mg/dL (ref 0.50–1.35)
Calcium: 8.1 mg/dL — ABNORMAL LOW (ref 8.4–10.5)
Chloride: 101 mEq/L (ref 96–112)
Glucose, Bld: 165 mg/dL — ABNORMAL HIGH (ref 70–99)
POTASSIUM: 3.5 meq/L — AB (ref 3.7–5.3)
Sodium: 142 mEq/L (ref 137–147)

## 2014-05-14 LAB — GLUCOSE, CAPILLARY
GLUCOSE-CAPILLARY: 286 mg/dL — AB (ref 70–99)
Glucose-Capillary: 149 mg/dL — ABNORMAL HIGH (ref 70–99)
Glucose-Capillary: 159 mg/dL — ABNORMAL HIGH (ref 70–99)
Glucose-Capillary: 194 mg/dL — ABNORMAL HIGH (ref 70–99)
Glucose-Capillary: 236 mg/dL — ABNORMAL HIGH (ref 70–99)
Glucose-Capillary: 259 mg/dL — ABNORMAL HIGH (ref 70–99)
Glucose-Capillary: 363 mg/dL — ABNORMAL HIGH (ref 70–99)

## 2014-05-14 MED ORDER — HYDRALAZINE HCL 20 MG/ML IJ SOLN: 10.0000 mg | INTRAMUSCULAR | Status: DC | PRN

## 2014-05-14 MED ORDER — OXYBUTYNIN CHLORIDE 5 MG PO TABS
2.5000 mg | ORAL_TABLET | Freq: Three times a day (TID) | ORAL | Status: DC | PRN
  Administered 2014-05-14: 2.5 mg via ORAL
  Filled 2014-05-14: qty 0.5

## 2014-05-14 MED ORDER — HYDRALAZINE HCL 20 MG/ML IJ SOLN
10.0000 mg | INTRAMUSCULAR | Status: DC | PRN
  Filled 2014-05-14: qty 0.5

## 2014-05-14 MED ORDER — PANTOPRAZOLE SODIUM 40 MG PO TBEC
40.0000 mg | DELAYED_RELEASE_TABLET | Freq: Every day | ORAL | Status: DC
  Administered 2014-05-14 – 2014-05-15 (×2): 40 mg via ORAL
  Filled 2014-05-14 (×2): qty 1

## 2014-05-14 MED ORDER — INSULIN ASPART 100 UNIT/ML ~~LOC~~ SOLN
0.0000 [IU] | Freq: Three times a day (TID) | SUBCUTANEOUS | Status: DC
  Administered 2014-05-14: 8 [IU] via SUBCUTANEOUS
  Administered 2014-05-15: 15 [IU] via SUBCUTANEOUS

## 2014-05-14 NOTE — Evaluation (Signed)
Physical Therapy Evaluation Patient Details Name: [name]. 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 Presented to ED after intentional Baclofen overdose. Developed AMS and respiratory failure requiring endotracheal intubation, however extubated on 05/13/2014.   Clinical Impression  Pt presents with some generalized weakness from baseline as described by his wife ( who is very keen and knowledgeable about his care and ability, she is an occupational therapist here at [facility] as well). Pt and pt's wife do not feel any further need for PT to follow here in acute care or as follow up. They will get back into his routine at home and she knows how to continue to strengthening him and make him safe at home. She also would like for me to discontinue the OT acute order for she would prefer to not have her peers in to treat her husband, but more importantly she feels perfectly fine with addressing any OT needs he may have their her own skill set.   Was able to assist his wife with the sliding board transfer to the power chair (of which she performed 99% herself) I was there for mostly assist if needed. She did state that he normally assists some , however with his BP rising due tot he midodrine being administered earlier , he was not able to assist as much at this moment. No further PT needs at this time, will sign off.     Follow Up Recommendations No PT follow up    Equipment Recommendations  None recommended by PT    Recommendations for Other Services       Precautions / Restrictions        Mobility  Bed Mobility Overal bed mobility: Needs Assistance;+2 for physical assistance Bed Mobility: Supine to Sit     Supine to sit: Max assist;+2 for physical assistance        Transfers Overall transfer level: Needs  assistance Equipment used:  (sliding board) Transfers: Lateral/Scoot Transfers          Lateral/Scoot Transfers: Total assist General transfer comment: performed by the wife with total assist transfer due to pt not feeling well at the moment (BP was higha dn pt experiencing really bad headache). Wife did fine with the transfer and states he is a little weak but she could tell a lot of it was the BP going to high bc they had given him the mididrone 2 hrs ago in prep to get up an 1 1/2 ago.   Ambulation/Gait                Hotel manager mobility: Yes Wheelchair Assistance Details (indicate cue type and reason): pt was beginning to perform his own WC maneuver with his power chair right sided controller  Modified Rankin (Stroke Patients Only)       Balance                                             Pertinent Vitals/Pain Reported headache before getting up, but is began to subside once he was sitting more upright in chair after about a minute.     Home Living Family/patient expects  to be discharged to:: Private residence Living Arrangements: Spouse/significant other Available Help at Discharge: Family Type of Home: House Home Access: [place]: Able to live on main level with bedroom/bathroom Home Equipment: Wheelchair - power Additional Comments: They are set with all the equipment they need    Prior Function Level of Independence: Needs assistance         Comments: Wife and family manage all his tranfers and needed assistance. Wife is an Warden/ranger here for [employer] and states she is fine with how they have been managing everything and has no worries at this time.      Hand Dominance        Extremity/Trunk Assessment                         Communication   Communication: No difficulties  Cognition                             General Comments      Exercises        Assessment/Plan    PT Assessment Patent does not need any further PT services  PT Diagnosis     PT Problem List    PT Treatment Interventions     PT Goals (Current goals can be found in the Care Plan section) Acute Rehab PT Goals PT Goal Formulation: No goals set, d/c therapy    Frequency     Barriers to discharge        Co-evaluation               End of Session   Activity Tolerance: Patient tolerated treatment well (pt felt a lot better once he got up and become much more alert and talkative. )             Time: 3343-5686 PT Time Calculation (min): 17 min   Charges:   PT Evaluation $Initial PT Evaluation Tier I: 1 Procedure     PT G CodesClide Dales May 18, 2014, 3:01 PM Clide Dales, PT Pager: [phone] 18-May-2014

## 2014-05-14 NOTE — Progress Notes (Signed)
PHARMACIST - PHYSICIAN COMMUNICATION  This patient is receiving pantoprazole. Based on criteria approved by the Pharmacy and Therapeutics Committee, this medication is being converted to the equivalent oral dose form. These criteria include:   . The patient is eating (either orally or per tube) and/or has been taking other orally administered medications for at least 24 hours.  . This patient has no evidence of active gastrointestinal bleeding or impaired GI absorption (gastrectomy, short bowel, patient on TNA or NPO).   If you have questions about this conversion, please contact the pharmacy department (ext [phone])  Doreene Eland, PharmD, BCPS.   Pager: [phone]  05/14/2014 10:08 AM

## 2014-05-14 NOTE — Progress Notes (Signed)
PT  Note/OT note  Patient Details Name: [name]. MRN: [id number] DOB: 05/10/1961  I also discontinued the OT note per patient and patient's wife's request. Pt's wife is an OT here in [place] at [facility] and would prefer not to have her peers come in , and more importantly feels confident she can address any of his OT needs with her own OT skills if needed. Pt is near baseline (with some slight weakness as she reports) and they manage this daily at home.       Clide Dales 05/14/2014, 3:09 PM

## 2014-05-14 NOTE — Progress Notes (Signed)
Clinical Social Work Department CLINICAL SOCIAL WORK PSYCHIATRY SERVICE LINE ASSESSMENT 05/14/2014  Patient:  [name]  Account:  [id number]  Admit Date:  05/10/2014  Clinical Social Worker:  Sindy Messing, LCSW  Date/Time:  05/14/2014 04:30 PM Referred by:  Physician  Date referred:  05/14/2014 Reason for Referral  Psychosocial assessment   Presenting Symptoms/Problems (In the person's/family's own words):   Psych consulted due to overdose.   Abuse/Neglect/Trauma History (check all that apply)  Denies history   Abuse/Neglect/Trauma Comments:   Psychiatric History (check all that apply)  Denies history   Psychiatric medications:  None   Current Mental Health Hospitalizations/Previous Mental Health History:   Patient denies any previous MH diagnoses. Patient denies any current medications for MH reasons.   Current provider:   None   Place and Date:   N/A   Current Medications:   Scheduled Meds:      . antiseptic oral rinse  15 mL Mouth Rinse QID  . baclofen  10 mg Oral Q6H  . chlorhexidine  15 mL Mouth Rinse BID  . darifenacin  15 mg Oral Daily  . heparin  5,000 Units Subcutaneous 3 times per day  . insulin aspart  0-15 Units Subcutaneous TID WC  . lipase/protease/amylase  6 capsule Oral TID WC  . midodrine  10 mg Oral TID WC  . pantoprazole  40 mg Oral Daily        Continuous Infusions:      . sodium chloride 20 mL/hr at 05/13/14 1600          PRN Meds:.sodium chloride, sodium chloride, acetaminophen, albuterol, hydrALAZINE, HYDROcodone-acetaminophen       Previous Impatient Admission/Date/Reason:   None reported   Emotional Health / Current Symptoms    Suicide/Self Harm  Suicide attempt in past (date/description)   Suicide attempt in the past:   Patient denies any previous attempts. Patient reports he was arguing with wife and overdosed on day of admission. Patient denies any current SI or HI.   Other harmful behavior:   None reported    Psychotic/Dissociative Symptoms  None reported   Other Psychotic/Dissociative Symptoms:   N/A    Attention/Behavioral Symptoms  Within Normal Limits   Other Attention / Behavioral Symptoms:   Patient engaged throughout assessment.    Cognitive Impairment  Within Normal Limits   Other Cognitive Impairment:   Patient alert and oriented.    Mood and Adjustment  Flat    Stress, Anxiety, Trauma, Any Recent Loss/Stressor  Relationship  Other - See comment   Anxiety (frequency):   N/A   Phobia (specify):   N/A   Compulsive behavior (specify):   N/A   Obsessive behavior (specify):   N/A   Other:   Patient has several medical concerns. Patient reports he was arguing with wife on day of admission.   Substance Abuse/Use  None   SBIRT completed (please refer for detailed history):  N  Self-reported substance use:   Patient denies all substance use.   Urinary Drug Screen Completed:  Y Alcohol level:   <11    Environmental/Housing/Living Arrangement  Stable housing   Who is in the home:   Wife   Emergency contact:  [employer]  Medicare   Patient's Strengths and Goals (patient's own words):   Patient has supportive wife. Patient reports that he is agreeable to treatment to increase coping skills.   Clinical Social Worker's Interpretive Summary:   CSW received referral in order  to complete psychosocial assessment. CSW reviewed chart and met with patient at bedside with psych MD.    Patient reports that he has been paralyzed for about 24 years. 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 Patient reports that he lives at home with wife and tries to be as independent as possible. Patient reports that even though he has a lot of medical problems that he has never been diagnosed with depression.    Patient reports on day of admission, he and wife got into an argument.  Patient denies feeling suicidal but reports that he was impulsive and decided to overdose. Patient regrets decision and denies and current SI or HI. CSW inquired about previous arguments with family and how patient and wife were able to cope other times. Patient admits that since health has declined that more stress has been placed on wife. Patient reports that he does not have any further plans to harm himself and contracts for safety.    CSW met with patient and wife at bedside. Wife reports that she and patient have a good relationship but that stress has increased due to patient requiring additional assistance. Patient and wife open to couples counseling and feel they could both benefit from increased coping skills and working on their communication skills. Wife feels that patient will be safe returning home and is agreeable to manage all of patient's medications.    CSW will staff case with psych MD and will assist with any recommendations provided.   Disposition:  Recommend Psych CSW continuing to support while in hospital   [place], [place] [phone]

## 2014-05-14 NOTE — Progress Notes (Signed)
Patient received from [place] via bed accompanied by RN and his wife. Awake, alert and oriented, pleasant and cooperative, V/s stable.

## 2014-05-14 NOTE — Progress Notes (Signed)
PULMONARY / CRITICAL CARE MEDICINE   Name: [name]. MRN: [id number] DOB: May 04, 1961    ADMISSION DATE:  05/10/2014  PRIMARY SERVICE: PCCM  CHIEF COMPLAINT:  Baclofen overdose, respiratory failure.   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 Presents after intentional Baclofen overdose after argument with his wife. Developed AMS and respiratory failure requiring endotracheal intubation.   SIGNIFICANT EVENTS: 6/05 Admit 6/08 Extubated 6/09 Psych consulted, Transfer to floor bed  STUDIES:   LINES / TUBES: ETT 6/6>>>6/08 L [place] TLC 6/6>>> R radial a-line 6/6>>>out  SUBJECTIVE:  Spasticity better.  Reports mood improved.  Denies chest pain, dyspnea.  VITAL SIGNS: Temp:  [98.2 F (36.8 C)-98.7 F (37.1 C)] 98.4 F (36.9 C) (06/09 0800) Pulse Rate:  [84-127] 91 (06/09 0800) Resp:  [11-20] 15 (06/09 0800) BP: (102-190)/(60-121) 153/84 mmHg (06/09 0800) SpO2:  [93 %-100 %] 99 % (06/09 0800) Weight:  [162 lb 4.1 oz (73.6 kg)] 162 lb 4.1 oz (73.6 kg) (06/09 0734) INTAKE / OUTPUT: Intake/Output     06/08 0701 - 06/09 0700 06/09 0701 - 06/10 0700   P.O. 720    I.V. (mL/kg) 236.7 (3.3) 20 (0.3)   NG/GT 130    Total Intake(mL/kg) 1086.7 (15.2) 20 (0.3)   Urine (mL/kg/hr) 1360 (0.8) 400 (2.1)   Stool 1 (0)    Total Output 1361 400   Net -274.3 -380         PHYSICAL EXAMINATION: General: no distress HEENT: wears glasses Heart: regular Lungs: No wheeze Abdomen: soft, non tender Musculoskeletal: no edema Skin: No rashes or lesions Neuro: Paraplegic from mid chest down  LABS:  CBC  Recent Labs Lab 05/10/14 2305 05/12/14 0525 05/13/14 0447  WBC 5.9 9.0 10.7*  HGB 12.3* 12.3* 11.2*  HCT 38.9* 38.7* 37.2*  PLT 208 194 167   BMET  Recent Labs Lab 05/12/14 0525 05/13/14 0447 05/14/14 0525  NA 142 144 142  K 4.1 4.4 3.5*  CL 104 105 101  CO2  25 27 29   BUN 18 26* 25*  CREATININE 0.55 0.60 0.62  GLUCOSE 222* 293* 165*   Electrolytes  Recent Labs Lab 05/12/14 0525 05/13/14 0447 05/14/14 0525  CALCIUM 8.7 8.6 8.1*  MG 2.3 2.4  --   PHOS 4.5 4.4  --    Sepsis Markers  Recent Labs Lab 05/12/14 0530  LATICACIDVEN 1.4   ABG  Recent Labs Lab 05/11/14 0500 05/12/14 0418 05/13/14 0440  PHART 7.516* 7.367 7.394  PCO2ART 29.0* 47.2* 39.6  PO2ART 303.0* 129.0* 114.0*   Glucose  Recent Labs Lab 05/13/14 1221 05/13/14 1633 05/13/14 1958 05/14/14 0002 05/14/14 0458 05/14/14 0751  GLUCAP 170* 239* 221* 194* 149* 159*   Imaging Dg Chest Port 1 View  05/14/2014   CLINICAL DATA:  Atelectasis  EXAM: PORTABLE CHEST - 1 VIEW  COMPARISON:  May 13, 2014  FINDINGS: The endotracheal tube and nasogastric tube have been removed. Central catheter tip is in the superior vena cava. No pneumothorax.  There has been interval clearing of atelectatic change from the left base. On the current examination, there is trace interstitial edema. Elsewhere lungs are clear. Heart is borderline enlarged with normal pulmonary vascularity. No adenopathy.  IMPRESSION: Trace edema. Suspect mild congestive heart failure. The previously noted left base atelectatic change has resolved. No consolidation. No pneumothorax.   Electronically Signed   By: Lowella Grip M.D.   On: 05/14/2014 07:03  Dg Chest Port 1 View  05/13/2014   CLINICAL DATA:  Intubation.  EXAM: PORTABLE CHEST - 1 VIEW  COMPARISON:  05/12/2014.  FINDINGS: Endotracheal tube and NG tube in stable position. Left base atelectasis present. Cardiomegaly with normal pulmonary vascularity. No pleural effusion or pneumothorax. No acute osseous abnormality.  IMPRESSION: 1. Stable line and tube positions. 2. Left base subsegmental atelectasis . 3. Cardiomegaly, stable.   Electronically Signed   By: Marcello Moores  Register   On: 05/13/2014 07:22    ASSESSMENT / PLAN: A:   AMS secondary to intentional  baclofen overdose. 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 P:   Bronchial hygiene Oxygen as needed to keep SpO2 > 92%  A:   Bladder spasticity. P:   Monitor renal fx, urine outpt, electrolytes Resumed vesicare >> enablex while in hospital  A:   Nutrition. S/p pancreatectomy. P:   Advance diet as tolerated Pancreatic enzymes  A:   DM post pancreatectomy. P:   SSI  Consult psych, d/c CVL, and transfer to [facility] 6/09.  Transfer to [facility] for 6/10 and PCCM sign off.  Chesley Mires, MD [facility] Pulmonary/Critical Care 05/14/2014, 9:33 AM Pager:  [phone] After 3pm call: [phone]

## 2014-05-15 ENCOUNTER — Encounter: Payer: Self-pay | Admitting: Urology

## 2014-05-15 DIAGNOSIS — T50902A Poisoning by unspecified drugs, medicaments and biological substances, intentional self-harm, initial encounter: Secondary | ICD-10-CM

## 2014-05-15 DIAGNOSIS — F4323 Adjustment disorder with mixed anxiety and depressed mood: Secondary | ICD-10-CM

## 2014-05-15 DIAGNOSIS — F39 Unspecified mood [affective] disorder: Secondary | ICD-10-CM

## 2014-05-15 LAB — BASIC METABOLIC PANEL
BUN: 22 mg/dL (ref 6–23)
CO2: 21 mEq/L (ref 19–32)
CREATININE: 0.73 mg/dL (ref 0.50–1.35)
Calcium: 8.5 mg/dL (ref 8.4–10.5)
Chloride: 91 mEq/L — ABNORMAL LOW (ref 96–112)
GFR calc Af Amer: >90 mL/min (ref >90)
GLUCOSE: 466 mg/dL — AB (ref 70–99)
POTASSIUM: 4 meq/L (ref 3.7–5.3)
Sodium: 137 mEq/L (ref 137–147)

## 2014-05-15 LAB — GLUCOSE, CAPILLARY
GLUCOSE-CAPILLARY: 459 mg/dL — AB (ref 70–99)
Glucose-Capillary: 324 mg/dL — ABNORMAL HIGH (ref 70–99)
Glucose-Capillary: 365 mg/dL — ABNORMAL HIGH (ref 70–99)
Glucose-Capillary: 266 mg/dL — ABNORMAL HIGH (ref 70–99)

## 2014-05-15 LAB — MAGNESIUM: Magnesium: 2.4 mg/dL (ref 1.5–2.5)

## 2014-05-15 MED ORDER — INSULIN GLARGINE 100 UNIT/ML ~~LOC~~ SOLN
20.0000 [IU] | Freq: Every day | SUBCUTANEOUS | Status: DC
  Filled 2014-05-15: qty 0.2

## 2014-05-15 MED ORDER — INSULIN ASPART 100 UNIT/ML ~~LOC~~ SOLN
0.0000 [IU] | Freq: Three times a day (TID) | SUBCUTANEOUS | Status: DC
  Administered 2014-05-15: 5 [IU] via SUBCUTANEOUS

## 2014-05-15 MED ORDER — INSULIN ASPART 100 UNIT/ML ~~LOC~~ SOLN: 6.0000 [IU] | Freq: Three times a day (TID) | SUBCUTANEOUS | Status: DC

## 2014-05-15 MED ORDER — DARIFENACIN HYDROBROMIDE ER 15 MG PO TB24: 15.0000 mg | ORAL_TABLET | Freq: Every day | ORAL | Status: DC

## 2014-05-15 MED ORDER — INSULIN ASPART 100 UNIT/ML ~~LOC~~ SOLN
15.0000 [IU] | Freq: Once | SUBCUTANEOUS | Status: AC
  Administered 2014-05-15: 15 [IU] via SUBCUTANEOUS

## 2014-05-15 MED ORDER — INSULIN GLARGINE 100 UNIT/ML ~~LOC~~ SOLN
25.0000 [IU] | Freq: Every day | SUBCUTANEOUS | Status: DC
  Administered 2014-05-15: 25 [IU] via SUBCUTANEOUS
  Filled 2014-05-15: qty 0.25

## 2014-05-15 MED ORDER — INSULIN ASPART 100 UNIT/ML ~~LOC~~ SOLN: 4.0000 [IU] | Freq: Three times a day (TID) | SUBCUTANEOUS | Status: DC

## 2014-05-15 MED ORDER — PANTOPRAZOLE SODIUM 40 MG PO TBEC: 40.0000 mg | DELAYED_RELEASE_TABLET | Freq: Every day | ORAL | Status: DC

## 2014-05-15 MED ORDER — INSULIN ASPART 100 UNIT/ML ~~LOC~~ SOLN
4.0000 [IU] | Freq: Three times a day (TID) | SUBCUTANEOUS | Status: DC
  Administered 2014-05-15: 4 [IU] via SUBCUTANEOUS

## 2014-05-15 MED ORDER — OXYBUTYNIN CHLORIDE 5 MG PO TABS: 2.5000 mg | ORAL_TABLET | Freq: Three times a day (TID) | ORAL | Status: DC | PRN

## 2014-05-15 NOTE — Progress Notes (Signed)
Patient's suprapubic catheter checked for placement, patent and intact, urine draining well. Patient is still complaining of spasm and pain around the suprapubic site, unrelieved by ditropan. Refused to take another vicodin because he said it only relieved him for a short time. Ice pack applied with some relief. Will continue to monitor patient.

## 2014-05-15 NOTE — Discharge Summary (Signed)
Physician Discharge Summary  [name]. VOH:[id number] DOB: June 16, 1961 DOA: 05/10/2014  PCP: [name]  Admit date: 05/10/2014 Discharge date: 05/15/2014  Time spent: minutes  Recommendations for Outpatient Follow-up:  1. Follow up with Psychiatrist out patient.   Discharge Diagnoses:    Baclofen overdose   Hypotension, unspecified   Acute respiratory failure   Altered mental status   Respiratory failure   Discharge Condition: Stable.   Diet recommendation: Carb Modified.   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 Presents after intentional Baclofen overdose after argument with his wife. Developed AMS and respiratory failure requiring endotracheal intubation   Hospital Course:  Patient admitted after baclofen overdose, he was intubated for airway protection. He was extubated. He is doing well from respiratory point. He was evaluated by Psych and was clear for discharge when medically ready. He was having supra pubic pain at site of supra pubic catheter. He was evaluated by urology and had catheter exchange. For his diabetes, home regimen was resume.    AMS secondary to intentional baclofen overdose.  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  Bronchial hygiene  Oxygen as needed to keep SpO2 > 92%   Bladder spasticity.  Monitor renal fx, urine outpt, electrolytes  Resumed vesicare >> enablex while in hospital   Nutrition.  S/p pancreatectomy.  Advance diet as tolerated   Pancreatic enzymes  DM post pancreatectomy.  Resume home regimen.   Procedures:  none  Consultations:  CCM  Discharge Exam: Filed Vitals:   05/15/14 0530  BP: 123/69  Pulse: 97  Temp: 97.9 F (36.6 C)  Resp: 20    General: Alert in no distress.  Cardiovascular: S 1, S 2 RRR Respiratory: CTA  Discharge Instructions You were cared for by a hospitalist during your hospital stay. If you have any questions about your discharge medications or the care you received while you were in the hospital after you are discharged, you can call the unit and asked to speak with the hospitalist on call if the hospitalist that took care of you is not available. Once you are discharged, your primary care physician will handle any further medical issues. Please note that NO REFILLS for any discharge medications will be authorized once you are discharged, as it is imperative that you return to your primary care physician (or establish a relationship with a primary care physician if you do not have one) for your aftercare needs so that they can reassess your need for medications and monitor your lab values.  Discharge Instructions   Diet Carb Modified    Complete by:  As directed      Increase activity slowly    Complete by:  As directed             Medication List         baclofen 20 MG tablet  Commonly known as:  LIORESAL  Take 1-2 tablets (20-40 mg total) by mouth 5 (five) times daily. TAKES 1 TABLET  4 TIMES  DAILY AND 2 AT BEDTIME     darifenacin 15 MG 24 hr tablet  Commonly known as:  ENABLEX  Take 1 tablet (15 mg total) by mouth daily.     diazepam 5 MG tablet  Commonly known as:  VALIUM  Take 5 mg by mouth every 6 (six) hours as needed for anxiety.     HYDROcodone-acetaminophen 5-500 MG per tablet  Commonly known as:  VICODIN  Take 0.5-1 tablets by mouth every 6 (six) hours as needed for pain.     insulin glargine 100 UNIT/ML injection  Commonly known as:  LANTUS  Inject 25  Units into the skin daily before breakfast.     insulin lispro 100 UNIT/ML injection  Commonly known as:  HUMALOG  Inject 3-9 Units into the skin 3 (three) times daily before meals. SS     midodrine 5 MG tablet  Commonly known as:  PROAMATINE  Take 10 mg by mouth as needed.     oxybutynin 5 MG tablet  Commonly known as:  DITROPAN  Take 0.5 tablets (2.5 mg total) by mouth every 8 (eight) hours as needed for bladder spasms.     Pancrelipase (Lip-Prot-Amyl) 24000 UNITS Cpep  Take 3 capsules by mouth 3 (three) times daily with meals.     pantoprazole 40 MG tablet  Commonly known as:  PROTONIX  Take 1 tablet (40 mg total) by mouth daily.     promethazine 12.5 MG tablet  Commonly known as:  PHENERGAN  Take 12.5 mg by mouth every 6 (six) hours as needed for nausea.     tiZANidine 4 MG tablet  Commonly known as:  ZANAFLEX  Take 4 mg by mouth every 6 (six) hours as needed for muscle spasms.     traMADol 50 MG tablet  Commonly known as:  ULTRAM  Take 50 mg by mouth 3 (three) times daily.     VESICARE 10 MG tablet  Generic drug:  solifenacin  Take 10 mg by mouth daily.       Allergies  Allergen Reactions  . Ciprocin-Fluocin-Procin [Fluocinolone Acetonide] Rash    Pt states this happened with IV Cipro. ABLE TO TAKE PO CIPRO.       Follow-up Information   Follow up with GURLEY,Scott, PA-C In 1 week.   Specialty:  Physician Assistant   Contact information:   [name] [phone]        The results of significant diagnostics from this hospitalization (including imaging, microbiology, ancillary and laboratory) are listed below for reference.    Significant Diagnostic Studies: Dg Chest Port 1 View  05/14/2014   CLINICAL DATA:  Atelectasis  EXAM: PORTABLE CHEST - 1 VIEW  COMPARISON:  May 13, 2014  FINDINGS: The endotracheal tube and nasogastric tube have been removed. Central catheter tip is in the superior vena cava. No  pneumothorax.  There has been interval clearing of atelectatic change from the left base. On the current examination, there is trace interstitial edema. Elsewhere lungs are clear. Heart is borderline enlarged with normal pulmonary vascularity. No adenopathy.  IMPRESSION: Trace edema. Suspect mild congestive heart failure. The previously noted left base atelectatic change has resolved. No consolidation. No pneumothorax.   Electronically Signed   By: Lowella Grip M.D.   On: 05/14/2014 07:03   Dg Chest Port 1 View  05/13/2014   CLINICAL DATA:  Intubation.  EXAM: PORTABLE CHEST - 1 VIEW  COMPARISON:  05/12/2014.  FINDINGS: Endotracheal tube and  NG tube in stable position. Left base atelectasis present. Cardiomegaly with normal pulmonary vascularity. No pleural effusion or pneumothorax. No acute osseous abnormality.  IMPRESSION: 1. Stable line and tube positions. 2. Left base subsegmental atelectasis . 3. Cardiomegaly, stable.   Electronically Signed   By: Marcello Moores  Register   On: 05/13/2014 07:22   Dg Chest Port 1 View  05/12/2014   CLINICAL DATA:  Intubated.  EXAM: PORTABLE CHEST - 1 VIEW  COMPARISON:  05/11/2014.  FINDINGS: Of the endotracheal tube and NG tubes are in good position. The left subclavian central venous catheter is unchanged. The cardiac silhouette, mediastinal and hilar contours are within normal limits. The lungs are clear. No pleural effusion or pneumothorax.  IMPRESSION: Stable support apparatus.  No acute pulmonary findings.   Electronically Signed   By: Kalman Jewels M.D.   On: 05/12/2014 06:09   Dg Chest Port 1 View  05/11/2014   CLINICAL DATA:  Central line placement.  EXAM: PORTABLE CHEST - 1 VIEW  COMPARISON:  05/11/2014 4:01 a.m.  FINDINGS: Endotracheal tube has tip 5.8 cm above the carina. Nasogastric tube has been placed and courses into the region of the stomach and off the inferior portion of the film. There has been placement of a left subclavian central venous catheter which  has tip over the region of the SVC at the level of the carina. Lungs are adequately inflated without focal consolidation or effusion. There is no evidence of left-sided pneumothorax. There is stable borderline cardiomegaly. Remainder the exam is unchanged.  IMPRESSION: No acute cardiopulmonary disease.  Tubes and lines as described.   Electronically Signed   By: Marin Olp M.D.   On: 05/11/2014 08:57   Dg Chest Portable 1 View  05/11/2014   CLINICAL DATA:  Intubation.  EXAM: PORTABLE CHEST - 1 VIEW  COMPARISON:  Chest CT 04/11/2014.  FINDINGS: The endotracheal tube is 6 cm above the carina. The heart is mildly enlarged but stable. The lungs are grossly clear.  IMPRESSION: The endotracheal tube is 6 cm above the carina.  No significant pulmonary findings.   Electronically Signed   By: Kalman Jewels M.D.   On: 05/11/2014 04:22    Microbiology: Recent Results (from the past 240 hour(s))  MRSA PCR SCREENING     Status: None   Collection Time    05/11/14  7:13 AM      Result Value Ref Range Status   MRSA by PCR NEGATIVE  NEGATIVE Final   Comment:            The GeneXpert MRSA Assay (FDA     approved for NASAL specimens     only), is one component of a     comprehensive MRSA colonization     surveillance program. It is not     intended to diagnose MRSA     infection nor to guide or     monitor treatment for     MRSA infections.     Labs: Basic Metabolic Panel:  Recent Labs Lab 05/10/14 2305 05/12/14 0525 05/13/14 0447 05/14/14 0525 05/15/14 0350  NA 138 142 144 142 137  K 3.9 4.1 4.4 3.5* 4.0  CL 100 104 105 101 91*  CO2 26 25 27 29 21   GLUCOSE 188* 222* 293* 165* 466*  BUN 16 18 26* 25* 22  CREATININE 0.58 0.55 0.60 0.62 0.73  CALCIUM 8.4 8.7 8.6 8.1* 8.5  MG  --  2.3 2.4  --  2.4  PHOS  --  4.5 4.4  --   --    Liver Function Tests: No results found for this basename: AST, ALT, ALKPHOS, BILITOT, PROT, ALBUMIN,  in the last 168 hours No results found for this basename:  LIPASE, AMYLASE,  in the last 168 hours No results found for this basename: AMMONIA,  in the last 168 hours CBC:  Recent Labs Lab 05/10/14 2305 05/12/14 0525 05/13/14 0447  WBC 5.9 9.0 10.7*  NEUTROABS 4.1  --   --   HGB 12.3* 12.3* 11.2*  HCT 38.9* 38.7* 37.2*  MCV 78.4 79.3 80.9  PLT 208 194 167   Cardiac Enzymes: No results found for this basename: CKTOTAL, CKMB, CKMBINDEX, TROPONINI,  in the last 168 hours BNP: BNP (last 3 results) No results found for this basename: PROBNP,  in the last 8760 hours CBG:  Recent Labs Lab 05/14/14 2106 05/14/14 2156 05/15/14 0514 05/15/14 0758 05/15/14 1124  GLUCAP 324* 363* 459* 365* 266*       Signed:  Marlena Barbato A  [facility] 05/15/2014, 2:28 PM

## 2014-05-15 NOTE — Progress Notes (Signed)
Inpatient Diabetes Program Recommendations  AACE/ADA: New Consensus Statement on Inpatient Glycemic Control (2013)  Target Ranges:  Prepandial:   less than 140 mg/dL      Peak postprandial:   less than 180 mg/dL (1-2 hours)      Critically ill patients:  140 - 180 mg/dL   Reason for Visit: Diabetes Consult  Diabetes history: DM (metastatic neuroendocrine carcinoma) Outpatient Diabetes medications: Lantus 25 units QAM, Novolog 1:10 CHO ratio and s/s  Results for Scott, Murphy (MRN [id number]) as of 05/15/2014 09:38  Ref. Range 05/14/2014 18:10 05/14/2014 21:06 05/14/2014 21:56 05/15/2014 05:14 05/15/2014 07:58  Glucose-Capillary Latest Range: 70-99 mg/dL 286 (H) 324 (H) 363 (H) 459 (H) 365 (H)  Results for Scott, Murphy (MRN [id number]) as of 05/15/2014 09:38  Ref. Range 05/15/2014 03:50  Sodium Latest Range: 137-147 mEq/L 137  Potassium Latest Range: 3.7-5.3 mEq/L 4.0  Chloride Latest Range: 96-112 mEq/L 91 (L)  CO2 Latest Range: 19-32 mEq/L 21  BUN Latest Range: 6-23 mg/dL 22  Creatinine Latest Range: 0.50-1.35 mg/dL 0.73  Calcium Latest Range: 8.4-10.5 mg/dL 8.5  GFR calc non Af Amer Latest Range: >90 mL/min >90  GFR calc Af Amer Latest Range: >90 mL/min >90  Glucose Latest Range: 70-99 mg/dL 466 (H)   CO2 - 21, AG - 25 Needs basal insulin.  Inpatient Diabetes Program Recommendations Insulin - Basal: Add Lantus 25 units QAM (home dose) Correction (SSI): Decrease to Novolog sensitive tidwc and hs Insulin - Meal Coverage: Novolog 6 units tidwc for meal coverage insulin (pt counts CHOs at home - 1:10 ratio)  Note: Discussed with MD and RN. Will follow. Thank you. Lorenda Peck, RD, LDN, CDE Inpatient Diabetes Coordinator [phone]
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
## 2014-05-28 DIAGNOSIS — H35379 Puckering of macula, unspecified eye: Secondary | ICD-10-CM | POA: Diagnosis not present

## 2014-05-28 DIAGNOSIS — Q859 Phakomatosis, unspecified: Secondary | ICD-10-CM | POA: Diagnosis not present

## 2014-05-28 DIAGNOSIS — E109 Type 1 diabetes mellitus without complications: Secondary | ICD-10-CM | POA: Diagnosis not present

## 2014-05-28 DIAGNOSIS — D1809 Hemangioma of other sites: Secondary | ICD-10-CM | POA: Diagnosis not present

## 2014-06-11 ENCOUNTER — Other Ambulatory Visit (HOSPITAL_BASED_OUTPATIENT_CLINIC_OR_DEPARTMENT_OTHER): Payer: 59 | Admitting: Lab

## 2014-06-11 ENCOUNTER — Encounter: Payer: Self-pay | Admitting: Hematology & Oncology

## 2014-06-11 ENCOUNTER — Ambulatory Visit (HOSPITAL_BASED_OUTPATIENT_CLINIC_OR_DEPARTMENT_OTHER): Payer: 59

## 2014-06-11 ENCOUNTER — Ambulatory Visit (HOSPITAL_BASED_OUTPATIENT_CLINIC_OR_DEPARTMENT_OTHER): Payer: 59 | Admitting: Hematology & Oncology

## 2014-06-11 VITALS — BP 114/76 | HR 75 | Temp 97.7°F | Resp 18 | Ht 69.0 in

## 2014-06-11 DIAGNOSIS — C7B8 Other secondary neuroendocrine tumors: Secondary | ICD-10-CM

## 2014-06-11 DIAGNOSIS — C7A00 Malignant carcinoid tumor of unspecified site: Secondary | ICD-10-CM

## 2014-06-11 DIAGNOSIS — C7A8 Other malignant neuroendocrine tumors: Secondary | ICD-10-CM

## 2014-06-11 DIAGNOSIS — C787 Secondary malignant neoplasm of liver and intrahepatic bile duct: Secondary | ICD-10-CM

## 2014-06-11 DIAGNOSIS — R300 Dysuria: Secondary | ICD-10-CM

## 2014-06-11 DIAGNOSIS — E3400 Carcinoid syndrome, unspecified: Secondary | ICD-10-CM

## 2014-06-11 DIAGNOSIS — M818 Other osteoporosis without current pathological fracture: Secondary | ICD-10-CM

## 2014-06-11 LAB — CBC WITH DIFFERENTIAL (CANCER CENTER ONLY)
BASO#: 0 10*3/uL (ref 0.0–0.2)
BASO%: 0.5 % (ref 0.0–2.0)
EOS ABS: 0.2 10*3/uL (ref 0.0–0.5)
EOS%: 3.2 % (ref 0.0–7.0)
HEMATOCRIT: 40.7 % (ref 38.7–49.9)
HEMOGLOBIN: 12.8 g/dL — AB (ref 13.0–17.1)
LYMPH#: 1.5 10*3/uL (ref 0.9–3.3)
LYMPH%: 25.8 % (ref 14.0–48.0)
MCH: 25.3 pg — ABNORMAL LOW (ref 28.0–33.4)
MCHC: 31.4 g/dL — ABNORMAL LOW (ref 32.0–35.9)
MCV: 81 fL — AB (ref 82–98)
MONO#: 0.6 10*3/uL (ref 0.1–0.9)
MONO%: 9.3 % (ref 0.0–13.0)
NEUT#: 3.6 10*3/uL (ref 1.5–6.5)
NEUT%: 61.2 % (ref 40.0–80.0)
Platelets: 238 10*3/uL (ref 145–400)
RBC: 5.05 10*6/uL (ref 4.20–5.70)
RDW: 14.8 % (ref 11.1–15.7)
WBC: 5.9 10*3/uL (ref 4.0–10.0)

## 2014-06-11 LAB — CMP (CANCER CENTER ONLY)
ALBUMIN: 2.8 g/dL — AB (ref 3.3–5.5)
ALT(SGPT): 32 U/L (ref 10–47)
AST: 46 U/L — AB (ref 11–38)
Alkaline Phosphatase: 141 U/L — ABNORMAL HIGH (ref 26–84)
BUN, Bld: 12 mg/dL (ref 7–22)
CALCIUM: 8.2 mg/dL (ref 8.0–10.3)
CO2: 28 mEq/L (ref 18–33)
Chloride: 96 mEq/L — ABNORMAL LOW (ref 98–108)
Creat: 0.4 mg/dl — ABNORMAL LOW (ref 0.6–1.2)
GLUCOSE: 216 mg/dL — AB (ref 73–118)
POTASSIUM: 3.3 meq/L (ref 3.3–4.7)
Sodium: 138 mEq/L (ref 128–145)
Total Bilirubin: 0.5 mg/dl (ref 0.20–1.60)
Total Protein: 7 g/dL (ref 6.4–8.1)

## 2014-06-11 MED ORDER — OCTREOTIDE ACETATE 30 MG IM KIT
30.0000 mg | PACK | Freq: Once | INTRAMUSCULAR | Status: AC
  Administered 2014-06-11: 30 mg via INTRAMUSCULAR

## 2014-06-11 MED ORDER — OCTREOTIDE ACETATE 30 MG IM KIT
PACK | INTRAMUSCULAR | Status: AC
  Filled 2014-06-11: qty 1

## 2014-06-11 NOTE — Progress Notes (Signed)
Hematology and Oncology Follow Up Visit  Hy Swiatek [id number] 1961-11-03 53 y.o. 06/11/2014   Principle Diagnosis:   Metastatic neuroendocrine carcinoma  Von Hipple-Lindau Syndrome  Current Therapy:   Sandostatin 30 mg IM every month     Interim History:  Mr.  Pompei is back for followup. He is doing quite well. He had 2 is hospitalized a few weeks ago. He has some respiratory difficulties. There may been some aspiration. He got through this without any difficulty.  He has a suprapubic catheter in place. He says his urine is somewhat foul-smelling. We will get a urine culture on him.  He's had no problems with abdominal pain. He's had no nausea vomiting. He's had no bleeding. He's had no fever. There's been no problems with leg swelling.  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  His last chromogranin A level back in June was 4.2.  He's had no problems with diarrhea. His stools are somewhat soft. He is on pancreatic replacement therapy.  He has diabetes. He is on insulin. He has this under fairly good control.  Medications: Current outpatient prescriptions:baclofen (LIORESAL) 20 MG tablet, Take 1-2 tablets (20-40 mg total) by mouth 5 (five) times daily. TAKES 1 TABLET  4 TIMES DAILY AND 2 AT BEDTIME, Disp: 120 each, Rfl: 12;  darifenacin (ENABLEX) 15 MG 24 hr tablet, Take 1 tablet (15 mg total) by mouth daily., Disp: 30 tablet, Rfl: 0;  diazepam (VALIUM) 5 MG tablet, Take 5 mg by mouth every 6 (six) hours as needed for anxiety., Disp: , Rfl:  HYDROcodone-acetaminophen (VICODIN) 5-500 MG per tablet, Take 0.5-1 tablets by mouth every 6 (six) hours as needed for pain., Disp: 120 tablet, Rfl: 0;  insulin glargine (LANTUS) 100 UNIT/ML injection, Inject 25 Units into the skin daily before breakfast. , Disp: , Rfl: ;  insulin lispro (HUMALOG) 100 UNIT/ML injection, Inject 3-9 Units into the skin 3 (three) times daily before meals. SS, Disp: , Rfl:  midodrine (PROAMATINE) 5 MG tablet, Take  10 mg by mouth as needed. , Disp: , Rfl: ;  oxybutynin (DITROPAN) 5 MG tablet, Take 0.5 tablets (2.5 mg total) by mouth every 8 (eight) hours as needed for bladder spasms., Disp: 30 tablet, Rfl: 0;  Pancrelipase, Lip-Prot-Amyl, 24000 UNITS CPEP, Take 3 capsules by mouth 3 (three) times daily with meals., Disp: , Rfl:  pantoprazole (PROTONIX) 40 MG tablet, Take 1 tablet (40 mg total) by mouth daily., Disp: 30 tablet, Rfl: 0;  promethazine (PHENERGAN) 12.5 MG tablet, Take 12.5 mg by mouth every 6 (six) hours as needed for nausea. , Disp: , Rfl: ;  solifenacin (VESICARE) 10 MG tablet, Take 10 mg by mouth daily. , Disp: , Rfl: ;  tiZANidine (ZANAFLEX) 4 MG tablet, Take 4 mg by mouth every 6 (six) hours as needed for muscle spasms., Disp: , Rfl:  traMADol (ULTRAM) 50 MG tablet, Take 50 mg by mouth 3 (three) times daily., Disp: , Rfl:   Allergies:  Allergies  Allergen Reactions  . Ciprocin-Fluocin-Procin [Fluocinolone Acetonide] Rash    Pt states this happened with IV Cipro. ABLE TO TAKE PO CIPRO.  . Other Rash    IV-CIPRO    Past Medical History, Surgical history, Social history, and Family History were reviewed and updated.  Review of Systems: As above  Physical Exam:  height is 5\' 9"  (1.753 m). His oral temperature is 97.7 F (36.5 C). His blood pressure is 114/76 and his pulse is 75. His respiration is 18.  Fairly well-developed and well-nourished gentleman. Head and neck exam shows no ocular or oral lesions. He has a surgical scar in the base of his neck from a past spine surgery. Lungs are clear. Cardiac exam regular rate rhythm with no murmurs rubs or bruits. Abdomen is soft. Has laparotomy scars. He has good bowel sounds. He's had no guarding or rebound tenderness. The suprapubic catheter site is intact. There is no palpable abdominal mass. No palpable liver or spleen. Extremities shows no clubbing cyanosis or edema. Again, is paralyzed from the waist down. Skin exam no rashes. Neurological  exam shows the paralysis as stated previously. Lab Results  Component Value Date   WBC 5.9 06/11/2014   HGB 12.8* 06/11/2014   HCT 40.7 06/11/2014   MCV 81* 06/11/2014   PLT 238 06/11/2014     Chemistry      Component Value Date/Time   NA 138 06/11/2014 0945   NA 137 05/15/2014 0350   K 3.3 06/11/2014 0945   K 4.0 05/15/2014 0350   CL 96* 06/11/2014 0945   CL 91* 05/15/2014 0350   CO2 28 06/11/2014 0945   CO2 21 05/15/2014 0350   BUN 12 06/11/2014 0945   BUN 22 05/15/2014 0350   CREATININE 0.4* 06/11/2014 0945   CREATININE 0.73 05/15/2014 0350      Component Value Date/Time   CALCIUM 8.2 06/11/2014 0945   CALCIUM 8.5 05/15/2014 0350   ALKPHOS 141* 06/11/2014 0945   ALKPHOS 156* 04/11/2014 1513   AST 46* 06/11/2014 0945   AST 33 04/11/2014 1513   ALT 32 06/11/2014 0945   ALT 21 04/11/2014 1513   BILITOT 0.50 06/11/2014 0945   BILITOT 0.5 04/11/2014 1513         Impression and Plan: Mr. Mcbane is 53 year old woman with metastatic neuroendocrine tumors. We've been doing this now for approximately 5 years or more. He has done incredibly well. He did get intrahepatic therapy last year. This was back in May of 2014. . Again he responded to this.  This was his second treatment. He has responded well.  We did have him on systemic chemotherapy. This was oral therapy. Again he had responded well.  He did have surgery at [facility]. This is for spinal tumors. This also was effective.  We will repeat his CT scans. This will would be done next month when we see him back.   Volanda Napoleon, MD 7/7/201511:25 AM

## 2014-06-13 ENCOUNTER — Other Ambulatory Visit: Payer: Self-pay | Admitting: Hematology & Oncology

## 2014-06-15 LAB — CHROMOGRANIN A: CHROMOGRANIN A: 4.4 ng/mL (ref 1.9–15.0)

## 2014-06-15 LAB — LACTATE DEHYDROGENASE: LDH: 148 U/L (ref 94–250)

## 2014-06-28 ENCOUNTER — Telehealth: Payer: Self-pay | Admitting: *Deleted

## 2014-06-28 NOTE — Telephone Encounter (Signed)
Pt states he is complaining of fatigue. Informed MD. Informed pt that the lab is closed for the day at our office, but we could bring him in on Monday morning for a lab appt. Pt stated he would see how the weekend went. Pt then stated his "urine smells like a fish tank." I informed pt he may need to go to an urgent care because he could have a UTI or other urinary issue. Pt stated he would "see how things go tomorrow."

## 2014-07-09 ENCOUNTER — Ambulatory Visit (HOSPITAL_BASED_OUTPATIENT_CLINIC_OR_DEPARTMENT_OTHER)
Admission: RE | Admit: 2014-07-09 | Discharge: 2014-07-09 | Disposition: A | Discharge status: Home / Self Care | Payer: 59 | Source: Ambulatory Visit | Attending: Hematology & Oncology | Admitting: Hematology & Oncology

## 2014-07-09 ENCOUNTER — Other Ambulatory Visit (HOSPITAL_BASED_OUTPATIENT_CLINIC_OR_DEPARTMENT_OTHER): Payer: 59 | Admitting: Lab

## 2014-07-09 ENCOUNTER — Encounter (HOSPITAL_BASED_OUTPATIENT_CLINIC_OR_DEPARTMENT_OTHER): Payer: Self-pay

## 2014-07-09 ENCOUNTER — Ambulatory Visit (HOSPITAL_BASED_OUTPATIENT_CLINIC_OR_DEPARTMENT_OTHER): Payer: 59 | Admitting: Hematology & Oncology

## 2014-07-09 ENCOUNTER — Other Ambulatory Visit: Payer: 59 | Admitting: Lab

## 2014-07-09 ENCOUNTER — Ambulatory Visit: Payer: 59 | Admitting: Hematology & Oncology

## 2014-07-09 ENCOUNTER — Ambulatory Visit: Payer: 59

## 2014-07-09 ENCOUNTER — Other Ambulatory Visit (HOSPITAL_BASED_OUTPATIENT_CLINIC_OR_DEPARTMENT_OTHER): Payer: 59

## 2014-07-09 VITALS — BP 73/56 | HR 102 | Temp 97.6°F

## 2014-07-09 DIAGNOSIS — M818 Other osteoporosis without current pathological fracture: Secondary | ICD-10-CM

## 2014-07-09 DIAGNOSIS — C7A8 Other malignant neuroendocrine tumors: Secondary | ICD-10-CM

## 2014-07-09 DIAGNOSIS — N2889 Other specified disorders of kidney and ureter: Secondary | ICD-10-CM | POA: Diagnosis not present

## 2014-07-09 DIAGNOSIS — R300 Dysuria: Secondary | ICD-10-CM | POA: Insufficient documentation

## 2014-07-09 DIAGNOSIS — C7A00 Malignant carcinoid tumor of unspecified site: Secondary | ICD-10-CM

## 2014-07-09 DIAGNOSIS — R911 Solitary pulmonary nodule: Secondary | ICD-10-CM | POA: Diagnosis not present

## 2014-07-09 DIAGNOSIS — D509 Iron deficiency anemia, unspecified: Secondary | ICD-10-CM

## 2014-07-09 DIAGNOSIS — D649 Anemia, unspecified: Secondary | ICD-10-CM

## 2014-07-09 DIAGNOSIS — R5381 Other malaise: Secondary | ICD-10-CM

## 2014-07-09 DIAGNOSIS — C787 Secondary malignant neoplasm of liver and intrahepatic bile duct: Secondary | ICD-10-CM

## 2014-07-09 DIAGNOSIS — R5383 Other fatigue: Secondary | ICD-10-CM

## 2014-07-09 LAB — CMP (CANCER CENTER ONLY)
ALT(SGPT): 21 U/L (ref 10–47)
AST: 40 U/L — ABNORMAL HIGH (ref 11–38)
Albumin: 3.2 g/dL — ABNORMAL LOW (ref 3.3–5.5)
Alkaline Phosphatase: 137 U/L — ABNORMAL HIGH (ref 26–84)
BUN, Bld: 16 mg/dL (ref 7–22)
CO2: 30 meq/L (ref 18–33)
Calcium: 7.9 mg/dL — ABNORMAL LOW (ref 8.0–10.3)
Chloride: 93 meq/L — ABNORMAL LOW (ref 98–108)
Creat: 0.8 mg/dL (ref 0.6–1.2)
Glucose, Bld: 125 mg/dL — ABNORMAL HIGH (ref 73–118)
Potassium: 3.3 meq/L (ref 3.3–4.7)
Sodium: 136 meq/L (ref 128–145)
Total Bilirubin: 0.5 mg/dL (ref 0.20–1.60)
Total Protein: 7.3 g/dL (ref 6.4–8.1)

## 2014-07-09 LAB — CBC WITH DIFFERENTIAL (CANCER CENTER ONLY)
BASO#: 0 10*3/uL (ref 0.0–0.2)
BASO%: 0.4 % (ref 0.0–2.0)
EOS ABS: 0.1 10*3/uL (ref 0.0–0.5)
EOS%: 2 % (ref 0.0–7.0)
HCT: 40.7 % (ref 38.7–49.9)
HGB: 13 g/dL (ref 13.0–17.1)
LYMPH#: 1.2 10*3/uL (ref 0.9–3.3)
LYMPH%: 21.4 % (ref 14.0–48.0)
MCH: 25.4 pg — AB (ref 28.0–33.4)
MCHC: 31.9 g/dL — AB (ref 32.0–35.9)
MCV: 80 fL — ABNORMAL LOW (ref 82–98)
MONO#: 0.7 10*3/uL (ref 0.1–0.9)
MONO%: 12.8 % (ref 0.0–13.0)
NEUT#: 3.6 10*3/uL (ref 1.5–6.5)
NEUT%: 63.4 % (ref 40.0–80.0)
Platelets: 229 10*3/uL (ref 145–400)
RBC: 5.11 10*6/uL (ref 4.20–5.70)
RDW: 14.4 % (ref 11.1–15.7)
WBC: 5.6 10*3/uL (ref 4.0–10.0)

## 2014-07-09 MED ORDER — IOHEXOL 300 MG/ML  SOLN
100.0000 mL | Freq: Once | INTRAMUSCULAR | Status: AC | PRN
  Administered 2014-07-09: 100 mL via INTRAVENOUS

## 2014-07-09 MED ORDER — OCTREOTIDE ACETATE 30 MG IM KIT
PACK | INTRAMUSCULAR | Status: AC
  Filled 2014-07-09: qty 1

## 2014-07-09 NOTE — Progress Notes (Signed)
Pt's wife stated that "he didn't need his injection and that they were leaving."

## 2014-07-10 ENCOUNTER — Encounter: Payer: Self-pay | Admitting: Hematology & Oncology

## 2014-07-10 DIAGNOSIS — D509 Iron deficiency anemia, unspecified: Secondary | ICD-10-CM

## 2014-07-10 HISTORY — DX: Iron deficiency anemia, unspecified: D50.9

## 2014-07-10 LAB — IRON AND TIBC CHCC
%SAT: 10 % — ABNORMAL LOW (ref 20–55)
IRON: 35 ug/dL — AB (ref 42–163)
TIBC: 345 ug/dL (ref 202–409)
UIBC: 310 ug/dL (ref 117–376)

## 2014-07-10 LAB — FERRITIN CHCC: Ferritin: 31 ng/ml (ref 22–316)

## 2014-07-10 NOTE — Progress Notes (Signed)
Hematology and Oncology Follow Up Visit  [name] [id number] Jul 16, 1961 53 y.o. 07/10/2014   Principle Diagnosis:   Metastatic neuroendocrine carcinoma  Von Hipple-Lindau Syndrome  Current Therapy:   Sandostatin 30 mg IM every month     Interim History:  [name] is for followup. He supremely well. He does feel fatigued. This is a little bit unusual for him.  His blood sugars have been doing okay. They actually may be are a little in the lower side.  He's had no bleeding. He's had no change in bowel or bladder habits. He has a suprapubic catheter. This was exchanged recently.  He's had no cough. He's had no fever.  His had no leg swelling. There's been no rashes. I would go ahead and repeat his scans. The CT scans he says he showed stable disease in his liver and extra hepatic areas. There is no evidence of any progression in the spinal column.  His appetite has been doing pretty well. His had no nausea vomiting.  His last chromogranin A level was 4.4. Medications: Current outpatient prescriptions:baclofen (LIORESAL) 20 MG tablet, Take 1-2 tablets (20-40 mg total) by mouth 5 (five) times daily. TAKES 1 TABLET  4 TIMES DAILY AND 2 AT BEDTIME, Disp: 120 each, Rfl: 12;  darifenacin (ENABLEX) 15 MG 24 hr tablet, Take 1 tablet (15 mg total) by mouth daily., Disp: 30 tablet, Rfl: 0;  diazepam (VALIUM) 5 MG tablet, TAKE 1 TABLET BY MOUTH 3 TIMES DAILY AS NEEDED FOR MUSCLE SPASMS, Disp: 40 tablet, Rfl: 1 HYDROcodone-acetaminophen (VICODIN) 5-500 MG per tablet, Take 0.5-1 tablets by mouth every 6 (six) hours as needed for pain., Disp: 120 tablet, Rfl: 0;  insulin glargine (LANTUS) 100 UNIT/ML injection, Inject 25 Units into the skin daily before breakfast. , Disp: , Rfl: ;  insulin lispro (HUMALOG) 100 UNIT/ML injection, Inject 3-9 Units into the skin 3 (three) times daily before meals. SS, Disp: , Rfl:  midodrine (PROAMATINE) 5 MG tablet, Take 10 mg by mouth as needed. , Disp: , Rfl:  ;  Pancrelipase, Lip-Prot-Amyl, 24000 UNITS CPEP, Take 3 capsules by mouth 3 (three) times daily with meals., Disp: , Rfl: ;  promethazine (PHENERGAN) 12.5 MG tablet, Take 12.5 mg by mouth every 6 (six) hours as needed for nausea. , Disp: , Rfl: ;  tiZANidine (ZANAFLEX) 4 MG tablet, Take 4 mg by mouth every 6 (six) hours as needed for muscle spasms., Disp: , Rfl:  traMADol (ULTRAM) 50 MG tablet, Take 50 mg by mouth 3 (three) times daily., Disp: , Rfl: ;  oxybutynin (DITROPAN) 5 MG tablet, Take 0.5 tablets (2.5 mg total) by mouth every 8 (eight) hours as needed for bladder spasms., Disp: 30 tablet, Rfl: 0;  pantoprazole (PROTONIX) 40 MG tablet, Take 1 tablet (40 mg total) by mouth daily., Disp: 30 tablet, Rfl: 0;  solifenacin (VESICARE) 10 MG tablet, Take 10 mg by mouth daily. , Disp: , Rfl:   Allergies:  Allergies  Allergen Reactions  . Ciprocin-Fluocin-Procin [Fluocinolone Acetonide] Rash    Pt states this happened with IV Cipro. ABLE TO TAKE PO CIPRO.  . Other Rash    IV-CIPRO    Past Medical History, Surgical history, Social history, and Family History were reviewed and updated.  Review of Systems: As above  Physical Exam:  oral temperature is 97.6 F (36.4 C). His blood pressure is 73/56 and his pulse is 102.   Well-developed and well-nourished gentleman. Head and neck exam shows the laminectomy scar  in the cervical spine. There is no oral lesions. He has no thrush. There is no adenopathy in the neck. Lungs are clear. Cardiac exam regular in rhythm with no murmurs rubs or bruits. Abdomen shows multiple laparotomy scars. He has no fluid wave. There is no guarding or rebound tenderness. There is no palpable liver or spleen. 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 Back exam no tenderness over the spine. Skin exam is with no suspicious lesions.  Lab Results  Component Value Date   WBC 5.6 07/09/2014   HGB 13.0 07/09/2014   HCT 40.7 07/09/2014   MCV 80* 07/09/2014   PLT 229 07/09/2014      Chemistry      Component Value Date/Time   NA 136 07/09/2014 0906   NA 137 05/15/2014 0350   K 3.3 07/09/2014 0906   K 4.0 05/15/2014 0350   CL 93* 07/09/2014 0906   CL 91* 05/15/2014 0350   CO2 30 07/09/2014 0906   CO2 21 05/15/2014 0350   BUN 16 07/09/2014 0906   BUN 22 05/15/2014 0350   CREATININE 0.8 07/09/2014 0906   CREATININE 0.73 05/15/2014 0350      Component Value Date/Time   CALCIUM 7.9* 07/09/2014 0906   CALCIUM 8.5 05/15/2014 0350   ALKPHOS 137* 07/09/2014 0906   ALKPHOS 156* 04/11/2014 1513   AST 40* 07/09/2014 0906   AST 33 04/11/2014 1513   ALT 21 07/09/2014 0906   ALT 21 04/11/2014 1513   BILITOT 0.50 07/09/2014 0906   BILITOT 0.5 04/11/2014 1513      Vitamin D is less than 10. Ferritin is 31 with an iron saturation 10%.   Impression and Plan: [name] is 50 year old children with metastatic neuroendocrine carcinoma. He had she's done incredibly well. He did get an intrahepatic therapy with radio isotope therapy. This was back in May of 2014. Again, the response has been good and sustained  I think what we need to get him on vitamin D. I would put him on 50,000 units a week.  Also that he needs iron intravenously. We will set this up for him. I believe this will make him feel better.  I don't think we have to do any scans on him for another 3 or 4 months.  He does well with Sandostatin. This is obviously helping him.  I will see him back in one month.   Volanda Napoleon, MD 8/5/20156:20 PM

## 2014-07-11 ENCOUNTER — Encounter: Payer: Self-pay | Admitting: Nurse Practitioner

## 2014-07-11 ENCOUNTER — Telehealth: Payer: Self-pay | Admitting: Nurse Practitioner

## 2014-07-11 ENCOUNTER — Other Ambulatory Visit: Payer: Self-pay | Admitting: Nurse Practitioner

## 2014-07-11 MED ORDER — ERGOCALCIFEROL 1.25 MG (50000 UT) PO CAPS: 50000.0000 [IU] | ORAL_CAPSULE | ORAL | Status: DC

## 2014-07-11 NOTE — Telephone Encounter (Addendum)
Message copied by Jimmy Footman on Thu Jul 11, 2014 12:09 PM ------      Message from: Burney Gauze R      Created: Wed Jul 10, 2014  6:04 PM       Call - iron is pretty low!!!  Will need Feraheme 1020mg  x 1 dose. Please set up in 1 week!!  Laurey Arrow ------Pt verbalized understanding and has appointment set-up.

## 2014-07-12 LAB — LACTATE DEHYDROGENASE: LDH: 155 U/L (ref 94–250)

## 2014-07-12 LAB — CHROMOGRANIN A: Chromogranin A: 16 ng/mL — ABNORMAL HIGH (ref <=15)

## 2014-07-12 LAB — VITAMIN D 25 HYDROXY (VIT D DEFICIENCY, FRACTURES): Vit D, 25-Hydroxy: <10 ng/mL — ABNORMAL LOW (ref 30–89)

## 2014-07-18 ENCOUNTER — Ambulatory Visit (HOSPITAL_BASED_OUTPATIENT_CLINIC_OR_DEPARTMENT_OTHER): Payer: 59

## 2014-07-18 VITALS — BP 133/88 | HR 79 | Temp 97.9°F | Resp 20

## 2014-07-18 DIAGNOSIS — C787 Secondary malignant neoplasm of liver and intrahepatic bile duct: Secondary | ICD-10-CM

## 2014-07-18 DIAGNOSIS — D509 Iron deficiency anemia, unspecified: Secondary | ICD-10-CM

## 2014-07-18 DIAGNOSIS — C7A00 Malignant carcinoid tumor of unspecified site: Secondary | ICD-10-CM

## 2014-07-18 DIAGNOSIS — C7A8 Other malignant neuroendocrine tumors: Secondary | ICD-10-CM

## 2014-07-18 MED ORDER — OCTREOTIDE ACETATE 30 MG IM KIT
30.0000 mg | PACK | Freq: Once | INTRAMUSCULAR | Status: AC
  Administered 2014-07-18: 30 mg via INTRAMUSCULAR

## 2014-07-18 MED ORDER — SODIUM CHLORIDE 0.9 % IV SOLN
Freq: Once | INTRAVENOUS | Status: AC
  Administered 2014-07-18: 16:00:00 via INTRAVENOUS

## 2014-07-18 MED ORDER — SODIUM CHLORIDE 0.9 % IV SOLN
1020.0000 mg | Freq: Once | INTRAVENOUS | Status: AC
  Administered 2014-07-18: 1020 mg via INTRAVENOUS
  Filled 2014-07-18: qty 34

## 2014-07-18 NOTE — Patient Instructions (Signed)
Ferumoxytol injection What is this medicine? FERUMOXYTOL is an iron complex. Iron is used to make healthy red blood cells, which carry oxygen and nutrients throughout the body. This medicine is used to treat iron deficiency anemia in people with chronic kidney disease. This medicine may be used for other purposes; ask your health care provider or pharmacist if you have questions. COMMON BRAND NAME(S): Feraheme What should I tell my health care provider before I take this medicine? They need to know if you have any of these conditions: -anemia not caused by low iron levels -high levels of iron in the blood -magnetic resonance imaging (MRI) test scheduled -an unusual or allergic reaction to iron, other medicines, foods, dyes, or preservatives -pregnant or trying to get pregnant -breast-feeding How should I use this medicine? This medicine is for injection into a vein. It is given by a health care professional in a hospital or clinic setting. Talk to your pediatrician regarding the use of this medicine in children. Special care may be needed. Overdosage: If you think you've taken too much of this medicine contact a poison control center or emergency room at once. Overdosage: If you think you have taken too much of this medicine contact a poison control center or emergency room at once. NOTE: This medicine is only for you. Do not share this medicine with others. What if I miss a dose? It is important not to miss your dose. Call your doctor or health care professional if you are unable to keep an appointment. What may interact with this medicine? This medicine may interact with the following medications: -other iron products This list may not describe all possible interactions. Give your health care provider a list of all the medicines, herbs, non-prescription drugs, or dietary supplements you use. Also tell them if you smoke, drink alcohol, or use illegal drugs. Some items may interact with your  medicine. What should I watch for while using this medicine? Visit your doctor or healthcare professional regularly. Tell your doctor or healthcare professional if your symptoms do not start to get better or if they get worse. You may need blood work done while you are taking this medicine. You may need to follow a special diet. Talk to your doctor. Foods that contain iron include: whole grains/cereals, dried fruits, beans, or peas, leafy green vegetables, and organ meats (liver, kidney). What side effects may I notice from receiving this medicine? Side effects that you should report to your doctor or health care professional as soon as possible: -allergic reactions like skin rash, itching or hives, swelling of the face, lips, or tongue -breathing problems -changes in blood pressure -feeling faint or lightheaded, falls -fever or chills -flushing, sweating, or hot feelings -swelling of the ankles or feet Side effects that usually do not require medical attention (Report these to your doctor or health care professional if they continue or are bothersome.): -diarrhea -headache -nausea, vomiting -stomach pain This list may not describe all possible side effects. Call your doctor for medical advice about side effects. You may report side effects to FDA at 1-800-FDA-1088. Where should I keep my medicine? This drug is given in a hospital or clinic and will not be stored at home. NOTE: This sheet is a summary. It may not cover all possible information. If you have questions about this medicine, talk to your doctor, pharmacist, or health care provider.  2015, Elsevier/Gold Standard. (2012-07-07 15:23:36) Octreotide injection solution What is this medicine? OCTREOTIDE (ok TREE oh tide) is used   to reduce blood levels of growth hormone in patients with a condition called acromegaly. This medicine also reduces flushing and watery diarrhea caused by certain types of cancer. This medicine may be used for  other purposes; ask your health care provider or pharmacist if you have questions. COMMON BRAND NAME(S): Sandostatin, Sandostatin LAR What should I tell my health care provider before I take this medicine? They need to know if you have any of these conditions: -gallbladder disease -kidney disease -liver disease -an unusual or allergic reaction to octreotide, other medicines, foods, dyes, or preservatives -pregnant or trying to get pregnant -breast-feeding How should I use this medicine? This medicine is for injection under the skin or into a vein (only in emergency situations). It is usually given by a health care professional in a hospital or clinic setting. If you get this medicine at home, you will be taught how to prepare and give this medicine. Allow the injection solution to come to room temperature before use. Do not warm it artificially. Use exactly as directed. Take your medicine at regular intervals. Do not take your medicine more often than directed. It is important that you put your used needles and syringes in a special sharps container. Do not put them in a trash can. If you do not have a sharps container, call your pharmacist or healthcare provider to get one. Talk to your pediatrician regarding the use of this medicine in children. Special care may be needed. Overdosage: If you think you have taken too much of this medicine contact a poison control center or emergency room at once. NOTE: This medicine is only for you. Do not share this medicine with others. What if I miss a dose? If you miss a dose, take it as soon as you can. If it is almost time for your next dose, take only that dose. Do not take double or extra doses. What may interact with this medicine? Do not take this medicine with any of the following medications: -cisapride -droperidol -general anesthetics -grepafloxacin -perphenazine -thioridazine This medicine may also interact with the following  medications: -bromocriptine -cyclosporine -diuretics -medicines for blood pressure, heart disease, irregular heart beat -medicines for diabetes, including insulin -quinidine This list may not describe all possible interactions. Give your health care provider a list of all the medicines, herbs, non-prescription drugs, or dietary supplements you use. Also tell them if you smoke, drink alcohol, or use illegal drugs. Some items may interact with your medicine. What should I watch for while using this medicine? Visit your doctor or health care professional for regular checks on your progress. To help reduce irritation at the injection site, use a different site for each injection and make sure the solution is at room temperature before use. This medicine may cause increases or decreases in blood sugar. Signs of high blood sugar include frequent urination, unusual thirst, flushed or dry skin, difficulty breathing, drowsiness, stomach ache, nausea, vomiting or dry mouth. Signs of low blood sugar include chills, cool, pale skin or cold sweats, drowsiness, extreme hunger, fast heartbeat, headache, nausea, nervousness or anxiety, shakiness, trembling, unsteadiness, tiredness, or weakness. Contact your doctor or health care professional right away if you experience any of these symptoms. What side effects may I notice from receiving this medicine? Side effects that you should report to your doctor or health care professional as soon as possible: -allergic reactions like skin rash, itching or hives, swelling of the face, lips, or tongue -changes in blood sugar -changes in heart   rate -severe stomach pain Side effects that usually do not require medical attention (report to your doctor or health care professional if they continue or are bothersome): -diarrhea or constipation -gas or stomach pain -nausea, vomiting -pain, redness, swelling and irritation at site where injected This list may not describe all  possible side effects. Call your doctor for medical advice about side effects. You may report side effects to FDA at 1-800-FDA-1088. Where should I keep my medicine? Keep out of the reach of children. Store in a refrigerator between 2 and 8 degrees C (36 and 46 degrees F). Protect from light. Allow to come to room temperature naturally. Do not use artificial heat. If protected from light, the injection may be stored at room temperature between 20 and 30 degrees C (70 and 86 degrees F) for 14 days. After the initial use, throw away any unused portion of a multiple dose vial after 14 days. Throw away unused portions of the ampules after use. NOTE: This sheet is a summary. It may not cover all possible information. If you have questions about this medicine, talk to your doctor, pharmacist, or health care provider.  2015, Elsevier/Gold Standard. (2008-06-18 16:56:04)  

## 2014-07-24 ENCOUNTER — Other Ambulatory Visit: Payer: Self-pay

## 2014-07-24 DIAGNOSIS — C7A8 Other malignant neuroendocrine tumors: Secondary | ICD-10-CM

## 2014-07-24 MED ORDER — MIDODRINE HCL 5 MG PO TABS: 10.0000 mg | ORAL_TABLET | ORAL | Status: DC | PRN

## 2014-08-05 ENCOUNTER — Other Ambulatory Visit: Payer: Self-pay | Admitting: *Deleted

## 2014-08-05 DIAGNOSIS — C7A8 Other malignant neuroendocrine tumors: Secondary | ICD-10-CM

## 2014-08-06 ENCOUNTER — Encounter: Payer: Self-pay | Admitting: Hematology & Oncology

## 2014-08-06 ENCOUNTER — Other Ambulatory Visit (HOSPITAL_BASED_OUTPATIENT_CLINIC_OR_DEPARTMENT_OTHER): Payer: 59 | Admitting: Lab

## 2014-08-06 ENCOUNTER — Ambulatory Visit (HOSPITAL_BASED_OUTPATIENT_CLINIC_OR_DEPARTMENT_OTHER): Payer: 59 | Admitting: Hematology & Oncology

## 2014-08-06 ENCOUNTER — Ambulatory Visit (HOSPITAL_BASED_OUTPATIENT_CLINIC_OR_DEPARTMENT_OTHER): Payer: 59

## 2014-08-06 VITALS — BP 123/89 | HR 76 | Temp 97.7°F | Resp 18 | Ht 69.0 in

## 2014-08-06 DIAGNOSIS — C787 Secondary malignant neoplasm of liver and intrahepatic bile duct: Secondary | ICD-10-CM

## 2014-08-06 DIAGNOSIS — E559 Vitamin D deficiency, unspecified: Secondary | ICD-10-CM

## 2014-08-06 DIAGNOSIS — C7A00 Malignant carcinoid tumor of unspecified site: Secondary | ICD-10-CM

## 2014-08-06 DIAGNOSIS — C7A8 Other malignant neuroendocrine tumors: Secondary | ICD-10-CM

## 2014-08-06 LAB — CBC WITH DIFFERENTIAL (CANCER CENTER ONLY)
BASO#: 0 10*3/uL (ref 0.0–0.2)
BASO%: 0.1 % (ref 0.0–2.0)
EOS%: 1.4 % (ref 0.0–7.0)
Eosinophils Absolute: 0.1 10*3/uL (ref 0.0–0.5)
HCT: 44 % (ref 38.7–49.9)
HGB: 14.2 g/dL (ref 13.0–17.1)
LYMPH#: 1.3 10*3/uL (ref 0.9–3.3)
LYMPH%: 16.4 % (ref 14.0–48.0)
MCH: 26.6 pg — AB (ref 28.0–33.4)
MCHC: 32.3 g/dL (ref 32.0–35.9)
MCV: 82 fL (ref 82–98)
MONO#: 0.7 10*3/uL (ref 0.1–0.9)
MONO%: 9.4 % (ref 0.0–13.0)
NEUT#: 5.5 10*3/uL (ref 1.5–6.5)
NEUT%: 72.7 % (ref 40.0–80.0)
PLATELETS: 235 10*3/uL (ref 145–400)
RBC: 5.34 10*6/uL (ref 4.20–5.70)
RDW: 16.3 % — ABNORMAL HIGH (ref 11.1–15.7)
WBC: 7.6 10*3/uL (ref 4.0–10.0)

## 2014-08-06 MED ORDER — OCTREOTIDE ACETATE 30 MG IM KIT
30.0000 mg | PACK | Freq: Once | INTRAMUSCULAR | Status: AC
  Administered 2014-08-06: 30 mg via INTRAMUSCULAR

## 2014-08-06 MED ORDER — OCTREOTIDE ACETATE 30 MG IM KIT
PACK | INTRAMUSCULAR | Status: AC
  Filled 2014-08-06: qty 1

## 2014-08-06 NOTE — Progress Notes (Signed)
Hematology and Oncology Follow Up Visit  [name] [id number] 1961-12-03 53 y.o. 08/06/2014   Principle Diagnosis:   Metastatic neuroendocrine carcinoma  Von Hipple-Lindau Syndrome  Current Therapy:   Sandostatin 30 mg IM every month     Interim History:  [name] is for followup. We did go ahead and give him iron after he saw him last month. His iron studies were last saw him showed a ferritin of 30 with iron saturation of 10%.  When we last saw him, we checked her vitamin D and found that this was also quite low. I put him on 50,000 units weekly. We'll recheck his vitamin D level today. His blood sugars have been doing okay. They have been running a little on the low side. He actually seems to be doing a little better with them being below. He's had no bleeding.   He's had no change in bowel or bladder habits. He has had issues with the suprapubic catheter. . This was exchanged probably about 2 or 3 months ago.   He's had no cough. He's had no fever.  His had no leg swelling. There's been no rashes. I would go ahead and repeat his scans. The CT scans he says he showed stable disease in his liver and extra hepatic areas. There is no evidence of any progression in the spinal column.  His appetite has been doing pretty well. His had no nausea vomiting.  His last chromogranin A level was 16 Medications: Current outpatient prescriptions:baclofen (LIORESAL) 20 MG tablet, Take 1-2 tablets (20-40 mg total) by mouth 5 (five) times daily. TAKES 1 TABLET  4 TIMES DAILY AND 2 AT BEDTIME, Disp: 120 each, Rfl: 12;  diazepam (VALIUM) 5 MG tablet, TAKE 1 TABLET BY MOUTH 3 TIMES DAILY AS NEEDED FOR MUSCLE SPASMS, Disp: 40 tablet, Rfl: 1 ergocalciferol (VITAMIN D2) 50000 UNITS capsule, Take 1 capsule (50,000 Units total) by mouth once a week., Disp: 4 capsule, Rfl: 12;  HYDROcodone-acetaminophen (VICODIN) 5-500 MG per tablet, Take 0.5-1 tablets by mouth every 6 (six) hours as needed for  pain., Disp: 120 tablet, Rfl: 0;  insulin glargine (LANTUS) 100 UNIT/ML injection, Inject 25 Units into the skin daily before breakfast. , Disp: , Rfl:  insulin lispro (HUMALOG) 100 UNIT/ML injection, Inject 3-9 Units into the skin 3 (three) times daily before meals. SS, Disp: , Rfl: ;  midodrine (PROAMATINE) 5 MG tablet, Take 2 tablets (10 mg total) by mouth as needed., Disp: 60 tablet, Rfl: 6;  Pancrelipase, Lip-Prot-Amyl, 24000 UNITS CPEP, Take 3 capsules by mouth 3 (three) times daily with meals., Disp: , Rfl:  pantoprazole (PROTONIX) 40 MG tablet, Take 1 tablet (40 mg total) by mouth daily., Disp: 30 tablet, Rfl: 0;  promethazine (PHENERGAN) 12.5 MG tablet, Take 12.5 mg by mouth every 6 (six) hours as needed for nausea. , Disp: , Rfl: ;  tiZANidine (ZANAFLEX) 4 MG tablet, Take 4 mg by mouth every 6 (six) hours as needed for muscle spasms., Disp: , Rfl: ;  traMADol (ULTRAM) 50 MG tablet, Take 50 mg by mouth 3 (three) times daily., Disp: , Rfl:   Allergies:  Allergies  Allergen Reactions  . Ciprocin-Fluocin-Procin [Fluocinolone Acetonide] Rash    Pt states this happened with IV Cipro. ABLE TO TAKE PO CIPRO.  . Other Rash    IV-CIPRO    Past Medical History, Surgical history, Social history, and Family History were reviewed and updated.  Review of Systems: As above  Physical Exam:  height is 5\' 9"  (1.753 m). His oral temperature is 97.7 F (36.5 C). His blood pressure is 123/89 and his pulse is 76. His respiration is 18.   Well-developed and well-nourished gentleman. Head and neck exam shows the laminectomy scar in the cervical spine. There is no oral lesions. He has no thrush. There is no adenopathy in the neck. Lungs are clear. Cardiac exam regular in rhythm with no murmurs rubs or bruits. Abdomen shows multiple laparotomy scars. He has no fluid wave. There is no guarding or rebound tenderness. There is no palpable liver or spleen. 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 Back exam  no tenderness over the spine. Skin exam is with no suspicious lesions.  Lab Results  Component Value Date   WBC 7.6 08/06/2014   HGB 14.2 08/06/2014   HCT 44.0 08/06/2014   MCV 82 08/06/2014   PLT 235 08/06/2014     Chemistry      Component Value Date/Time   NA 136 07/09/2014 0906   NA 137 05/15/2014 0350   K 3.3 07/09/2014 0906   K 4.0 05/15/2014 0350   CL 93* 07/09/2014 0906   CL 91* 05/15/2014 0350   CO2 30 07/09/2014 0906   CO2 21 05/15/2014 0350   BUN 16 07/09/2014 0906   BUN 22 05/15/2014 0350   CREATININE 0.8 07/09/2014 0906   CREATININE 0.73 05/15/2014 0350      Component Value Date/Time   CALCIUM 7.9* 07/09/2014 0906   CALCIUM 8.5 05/15/2014 0350   ALKPHOS 137* 07/09/2014 0906   ALKPHOS 156* 04/11/2014 1513   AST 40* 07/09/2014 0906   AST 33 04/11/2014 1513   ALT 21 07/09/2014 0906   ALT 21 04/11/2014 1513   BILITOT 0.50 07/09/2014 0906   BILITOT 0.5 04/11/2014 1513      Vitamin D is less than 10. Ferritin is 31 with an iron saturation 10%.   Impression and Plan: [name] is 47 year old children with metastatic neuroendocrine carcinoma. He's done incredibly well. He did get an intrahepatic therapy with radioisotope therapy. This was back in May of 2014. Again, the response has been good and sustained  I think what we need to get him on vitamin D. I would put him on 50,000 units a week.   I don't think we have to do any scans on him for another 2 months He does well with Sandostatin. This is obviously helping him.  I will see him back in one month.   Volanda Napoleon, MD 9/1/20151:22 PM

## 2014-08-06 NOTE — Patient Instructions (Signed)

## 2014-08-07 ENCOUNTER — Encounter: Payer: Self-pay | Admitting: *Deleted

## 2014-08-10 LAB — COMPREHENSIVE METABOLIC PANEL
ALK PHOS: 143 U/L — AB (ref 39–117)
ALT: 34 U/L (ref 0–53)
AST: 37 U/L (ref 0–37)
Albumin: 3.5 g/dL (ref 3.5–5.2)
BUN: 15 mg/dL (ref 6–23)
CO2: 29 mEq/L (ref 19–32)
Calcium: 8.9 mg/dL (ref 8.4–10.5)
Chloride: 96 mEq/L (ref 96–112)
Creatinine, Ser: 0.74 mg/dL (ref 0.50–1.35)
Glucose, Bld: 201 mg/dL — ABNORMAL HIGH (ref 70–99)
Potassium: 3.8 mEq/L (ref 3.5–5.3)
SODIUM: 134 meq/L — AB (ref 135–145)
TOTAL PROTEIN: 6.7 g/dL (ref 6.0–8.3)
Total Bilirubin: 0.5 mg/dL (ref 0.2–1.2)

## 2014-08-10 LAB — CHROMOGRANIN A: Chromogranin A: 14 ng/mL (ref <=15)

## 2014-08-10 LAB — VITAMIN D 25 HYDROXY (VIT D DEFICIENCY, FRACTURES): Vit D, 25-Hydroxy: 16 ng/mL — ABNORMAL LOW (ref 30–89)

## 2014-08-16 ENCOUNTER — Telehealth: Payer: Self-pay | Admitting: *Deleted

## 2014-08-16 NOTE — Telephone Encounter (Signed)
Wife called stating that pt was seen yesterday by his urologist due to thoughts of a possible UTI. Wife stated the pt received antibiotics at the facility and at home. Wife states she is also concerned about the pt having elevated blood pressures this past weekend (one being 283/135) and that the patient has "intnese abdomen spasming", and he hasn't had a BM in 3 days. Wife stated she thought the patient might have a bowel obstruction and she contacted his GI doctor and is awaiting a call back from them. I informed wife that it is probably in the patient's best interest to get an appt with his GI doctor today.  Also informed wife that if the pt is unable to get a GI appt, then he should go to the ER.

## 2014-08-19 ENCOUNTER — Inpatient Hospital Stay (HOSPITAL_COMMUNITY)
Admission: EM | Admit: 2014-08-19 | Discharge: 2014-08-23 | DRG: 389 | Disposition: A | Discharge status: Home / Self Care | Payer: 59 | Attending: Internal Medicine | Admitting: Internal Medicine

## 2014-08-19 ENCOUNTER — Emergency Department (HOSPITAL_COMMUNITY): Payer: 59

## 2014-08-19 ENCOUNTER — Encounter (HOSPITAL_COMMUNITY): Payer: Self-pay | Admitting: Emergency Medicine

## 2014-08-19 DIAGNOSIS — Z87440 Personal history of urinary (tract) infections: Secondary | ICD-10-CM

## 2014-08-19 DIAGNOSIS — Z881 Allergy status to other antibiotic agents status: Secondary | ICD-10-CM | POA: Diagnosis not present

## 2014-08-19 DIAGNOSIS — R109 Unspecified abdominal pain: Secondary | ICD-10-CM | POA: Diagnosis not present

## 2014-08-19 DIAGNOSIS — D509 Iron deficiency anemia, unspecified: Secondary | ICD-10-CM | POA: Diagnosis present

## 2014-08-19 DIAGNOSIS — K560 Paralytic ileus: Secondary | ICD-10-CM | POA: Diagnosis not present

## 2014-08-19 DIAGNOSIS — Q859 Phakomatosis, unspecified: Secondary | ICD-10-CM

## 2014-08-19 DIAGNOSIS — I959 Hypotension, unspecified: Secondary | ICD-10-CM

## 2014-08-19 DIAGNOSIS — N390 Urinary tract infection, site not specified: Secondary | ICD-10-CM | POA: Diagnosis present

## 2014-08-19 DIAGNOSIS — C787 Secondary malignant neoplasm of liver and intrahepatic bile duct: Secondary | ICD-10-CM | POA: Diagnosis not present

## 2014-08-19 DIAGNOSIS — Z905 Acquired absence of kidney: Secondary | ICD-10-CM

## 2014-08-19 DIAGNOSIS — E891 Postprocedural hypoinsulinemia: Secondary | ICD-10-CM | POA: Diagnosis present

## 2014-08-19 DIAGNOSIS — E860 Dehydration: Secondary | ICD-10-CM | POA: Diagnosis present

## 2014-08-19 DIAGNOSIS — M62838 Other muscle spasm: Secondary | ICD-10-CM | POA: Diagnosis present

## 2014-08-19 DIAGNOSIS — K5939 Other megacolon: Secondary | ICD-10-CM | POA: Diagnosis not present

## 2014-08-19 DIAGNOSIS — Z9089 Acquired absence of other organs: Secondary | ICD-10-CM | POA: Diagnosis not present

## 2014-08-19 DIAGNOSIS — Z993 Dependence on wheelchair: Secondary | ICD-10-CM

## 2014-08-19 DIAGNOSIS — Z90410 Acquired total absence of pancreas: Secondary | ICD-10-CM

## 2014-08-19 DIAGNOSIS — I951 Orthostatic hypotension: Secondary | ICD-10-CM | POA: Diagnosis present

## 2014-08-19 DIAGNOSIS — C7A00 Malignant carcinoid tumor of unspecified site: Secondary | ICD-10-CM | POA: Diagnosis present

## 2014-08-19 DIAGNOSIS — Z87891 Personal history of nicotine dependence: Secondary | ICD-10-CM

## 2014-08-19 DIAGNOSIS — R51 Headache: Secondary | ICD-10-CM | POA: Diagnosis not present

## 2014-08-19 DIAGNOSIS — Z79899 Other long term (current) drug therapy: Secondary | ICD-10-CM | POA: Diagnosis not present

## 2014-08-19 DIAGNOSIS — Z794 Long term (current) use of insulin: Secondary | ICD-10-CM | POA: Diagnosis not present

## 2014-08-19 DIAGNOSIS — K5900 Constipation, unspecified: Secondary | ICD-10-CM | POA: Diagnosis present

## 2014-08-19 DIAGNOSIS — K562 Volvulus: Principal | ICD-10-CM | POA: Diagnosis present

## 2014-08-19 DIAGNOSIS — D649 Anemia, unspecified: Secondary | ICD-10-CM

## 2014-08-19 DIAGNOSIS — I10 Essential (primary) hypertension: Secondary | ICD-10-CM | POA: Diagnosis present

## 2014-08-19 DIAGNOSIS — E139 Other specified diabetes mellitus without complications: Secondary | ICD-10-CM | POA: Diagnosis present

## 2014-08-19 DIAGNOSIS — N319 Neuromuscular dysfunction of bladder, unspecified: Secondary | ICD-10-CM | POA: Diagnosis present

## 2014-08-19 DIAGNOSIS — C759 Malignant neoplasm of endocrine gland, unspecified: Secondary | ICD-10-CM | POA: Diagnosis not present

## 2014-08-19 DIAGNOSIS — R141 Gas pain: Secondary | ICD-10-CM | POA: Diagnosis not present

## 2014-08-19 DIAGNOSIS — C7A8 Other malignant neuroendocrine tumors: Secondary | ICD-10-CM | POA: Diagnosis present

## 2014-08-19 HISTORY — DX: Other specified diabetes mellitus without complications: E13.9

## 2014-08-19 HISTORY — DX: Anemia, unspecified: D64.9

## 2014-08-19 HISTORY — DX: Orthostatic hypotension: I95.1

## 2014-08-19 HISTORY — DX: Essential (primary) hypertension: I10

## 2014-08-19 HISTORY — DX: Postprocedural hypoinsulinemia: E89.1

## 2014-08-19 HISTORY — DX: Acquired total absence of pancreas: Z90.410

## 2014-08-19 LAB — CBC WITH DIFFERENTIAL/PLATELET
Basophils Absolute: 0 10*3/uL (ref 0.0–0.1)
Basophils Relative: 0 % (ref 0–1)
EOS ABS: 0 10*3/uL (ref 0.0–0.7)
Eosinophils Relative: 0 % (ref 0–5)
HCT: 45.3 % (ref 39.0–52.0)
HEMOGLOBIN: 15 g/dL (ref 13.0–17.0)
Lymphocytes Relative: 17 % (ref 12–46)
Lymphs Abs: 1.7 10*3/uL (ref 0.7–4.0)
MCH: 26.8 pg (ref 26.0–34.0)
MCHC: 33.1 g/dL (ref 30.0–36.0)
MCV: 80.9 fL (ref 78.0–100.0)
Monocytes Absolute: 0.7 10*3/uL (ref 0.1–1.0)
Monocytes Relative: 8 % (ref 3–12)
NEUTROS ABS: 7.4 10*3/uL (ref 1.7–7.7)
Neutrophils Relative %: 75 % (ref 43–77)
Platelets: 235 10*3/uL (ref 150–400)
RBC: 5.6 MIL/uL (ref 4.22–5.81)
RDW: 15.9 % — ABNORMAL HIGH (ref 11.5–15.5)
WBC: 9.9 10*3/uL (ref 4.0–10.5)

## 2014-08-19 LAB — COMPREHENSIVE METABOLIC PANEL
ALBUMIN: 3.3 g/dL — AB (ref 3.5–5.2)
ALT: 17 U/L (ref 0–53)
ANION GAP: 16 — AB (ref 5–15)
AST: 23 U/L (ref 0–37)
Alkaline Phosphatase: 151 U/L — ABNORMAL HIGH (ref 39–117)
BILIRUBIN TOTAL: 0.5 mg/dL (ref 0.3–1.2)
BUN: 24 mg/dL — ABNORMAL HIGH (ref 6–23)
CHLORIDE: 90 meq/L — AB (ref 96–112)
CO2: 27 mEq/L (ref 19–32)
Calcium: 9.6 mg/dL (ref 8.4–10.5)
Creatinine, Ser: 1.06 mg/dL (ref 0.50–1.35)
GFR calc Af Amer: >90 mL/min (ref >90)
GFR calc non Af Amer: 80 mL/min — ABNORMAL LOW (ref >90)
Glucose, Bld: 347 mg/dL — ABNORMAL HIGH (ref 70–99)
Potassium: 5 mEq/L (ref 3.7–5.3)
Sodium: 133 mEq/L — ABNORMAL LOW (ref 137–147)
Total Protein: 8.4 g/dL — ABNORMAL HIGH (ref 6.0–8.3)

## 2014-08-19 LAB — LIPASE, BLOOD: Lipase: 6 U/L — ABNORMAL LOW (ref 11–59)

## 2014-08-19 LAB — URINALYSIS, ROUTINE W REFLEX MICROSCOPIC
BILIRUBIN URINE: NEGATIVE
Glucose, UA: 500 mg/dL — AB
KETONES UR: 15 mg/dL — AB
NITRITE: NEGATIVE
Protein, ur: NEGATIVE mg/dL
SPECIFIC GRAVITY, URINE: 1.042 — AB (ref 1.005–1.030)
UROBILINOGEN UA: 0.2 mg/dL (ref 0.0–1.0)
pH: 5.5 (ref 5.0–8.0)

## 2014-08-19 LAB — URINE MICROSCOPIC-ADD ON

## 2014-08-19 LAB — GLUCOSE, CAPILLARY
GLUCOSE-CAPILLARY: 201 mg/dL — AB (ref 70–99)
Glucose-Capillary: 285 mg/dL — ABNORMAL HIGH (ref 70–99)
Glucose-Capillary: 160 mg/dL — ABNORMAL HIGH (ref 70–99)

## 2014-08-19 LAB — PROTIME-INR
INR: 1.17 (ref 0.00–1.49)
PROTHROMBIN TIME: 14.9 s (ref 11.6–15.2)

## 2014-08-19 LAB — MAGNESIUM: MAGNESIUM: 2.3 mg/dL (ref 1.5–2.5)

## 2014-08-19 LAB — APTT: aPTT: 34 seconds (ref 24–37)

## 2014-08-19 MED ORDER — MIDODRINE HCL 5 MG PO TABS
10.0000 mg | ORAL_TABLET | Freq: Two times a day (BID) | ORAL | Status: DC
  Administered 2014-08-20 – 2014-08-22 (×2): 10 mg via ORAL
  Filled 2014-08-19 (×8): qty 2

## 2014-08-19 MED ORDER — METHOCARBAMOL 1000 MG/10ML IJ SOLN
500.0000 mg | Freq: Three times a day (TID) | INTRAVENOUS | Status: DC
  Administered 2014-08-19 – 2014-08-20 (×3): 500 mg via INTRAVENOUS
  Filled 2014-08-19 (×4): qty 5

## 2014-08-19 MED ORDER — FLEET ENEMA 7-19 GM/118ML RE ENEM
1.0000 | ENEMA | Freq: Once | RECTAL | Status: AC
  Administered 2014-08-19: 1 via RECTAL
  Filled 2014-08-19: qty 1

## 2014-08-19 MED ORDER — DEXTROSE 5 % IV SOLN
1.0000 g | Freq: Three times a day (TID) | INTRAVENOUS | Status: DC
  Administered 2014-08-20 – 2014-08-23 (×11): 1 g via INTRAVENOUS
  Filled 2014-08-19 (×12): qty 1

## 2014-08-19 MED ORDER — IOHEXOL 300 MG/ML  SOLN
100.0000 mL | Freq: Once | INTRAMUSCULAR | Status: AC | PRN
  Administered 2014-08-19: 100 mL via INTRAVENOUS

## 2014-08-19 MED ORDER — ONDANSETRON HCL 4 MG/2ML IJ SOLN: 4.0000 mg | Freq: Four times a day (QID) | INTRAMUSCULAR | Status: DC | PRN

## 2014-08-19 MED ORDER — ACETAMINOPHEN 325 MG PO TABS
650.0000 mg | ORAL_TABLET | Freq: Four times a day (QID) | ORAL | Status: DC | PRN
  Administered 2014-08-20 (×2): 650 mg via ORAL
  Filled 2014-08-19 (×2): qty 2

## 2014-08-19 MED ORDER — HYDROMORPHONE HCL PF 1 MG/ML IJ SOLN
1.0000 mg | Freq: Once | INTRAMUSCULAR | Status: AC
  Administered 2014-08-19: 1 mg via INTRAMUSCULAR
  Filled 2014-08-19: qty 1

## 2014-08-19 MED ORDER — INSULIN GLARGINE 100 UNIT/ML ~~LOC~~ SOLN
18.0000 [IU] | Freq: Every day | SUBCUTANEOUS | Status: DC
  Administered 2014-08-20 – 2014-08-23 (×4): 18 [IU] via SUBCUTANEOUS
  Filled 2014-08-19 (×4): qty 0.18

## 2014-08-19 MED ORDER — LABETALOL HCL 5 MG/ML IV SOLN
10.0000 mg | INTRAVENOUS | Status: DC | PRN
  Filled 2014-08-19: qty 4

## 2014-08-19 MED ORDER — SODIUM CHLORIDE 0.9 % IV BOLUS (SEPSIS): 1000.0000 mL | Freq: Once | INTRAVENOUS | Status: DC

## 2014-08-19 MED ORDER — ENOXAPARIN SODIUM 40 MG/0.4ML ~~LOC~~ SOLN
40.0000 mg | SUBCUTANEOUS | Status: DC
  Administered 2014-08-19 – 2014-08-22 (×4): 40 mg via SUBCUTANEOUS
  Filled 2014-08-19 (×5): qty 0.4

## 2014-08-19 MED ORDER — HYDROMORPHONE HCL PF 1 MG/ML IJ SOLN
2.0000 mg | Freq: Once | INTRAMUSCULAR | Status: AC
  Administered 2014-08-19: 2 mg via INTRAMUSCULAR
  Filled 2014-08-19: qty 2

## 2014-08-19 MED ORDER — INSULIN ASPART 100 UNIT/ML ~~LOC~~ SOLN
0.0000 [IU] | SUBCUTANEOUS | Status: DC
  Administered 2014-08-19: 3 [IU] via SUBCUTANEOUS
  Administered 2014-08-19: 5 [IU] via SUBCUTANEOUS
  Administered 2014-08-20 (×2): 2 [IU] via SUBCUTANEOUS
  Administered 2014-08-21: 1 [IU] via SUBCUTANEOUS
  Administered 2014-08-21 – 2014-08-22 (×6): 2 [IU] via SUBCUTANEOUS
  Administered 2014-08-22: 1 [IU] via SUBCUTANEOUS
  Administered 2014-08-23: 3 [IU] via SUBCUTANEOUS
  Administered 2014-08-23: 1 [IU] via SUBCUTANEOUS
  Administered 2014-08-23: 2 [IU] via SUBCUTANEOUS

## 2014-08-19 MED ORDER — CEFTAZIDIME 1 G IJ SOLR
1.0000 g | INTRAMUSCULAR | Status: AC
  Administered 2014-08-19: 1 g via INTRAVENOUS
  Filled 2014-08-19 (×2): qty 1

## 2014-08-19 MED ORDER — DIAZEPAM 5 MG/ML IJ SOLN: 5.0000 mg | Freq: Three times a day (TID) | INTRAMUSCULAR | Status: DC | PRN

## 2014-08-19 MED ORDER — ACETAMINOPHEN 650 MG RE SUPP: 650.0000 mg | Freq: Four times a day (QID) | RECTAL | Status: DC | PRN

## 2014-08-19 MED ORDER — KETOROLAC TROMETHAMINE 30 MG/ML IJ SOLN
30.0000 mg | Freq: Four times a day (QID) | INTRAMUSCULAR | Status: DC | PRN
  Administered 2014-08-19 – 2014-08-23 (×6): 30 mg via INTRAVENOUS
  Filled 2014-08-19 (×4): qty 1

## 2014-08-19 MED ORDER — ONDANSETRON HCL 4 MG PO TABS: 4.0000 mg | ORAL_TABLET | Freq: Four times a day (QID) | ORAL | Status: DC | PRN

## 2014-08-19 MED ORDER — SODIUM CHLORIDE 0.9 % IV SOLN
INTRAVENOUS | Status: DC
Start: 2014-08-19 — End: 2014-08-20

## 2014-08-19 MED ORDER — HYDROMORPHONE HCL PF 1 MG/ML IJ SOLN
2.0000 mg | Freq: Once | INTRAMUSCULAR | Status: AC
  Administered 2014-08-19: 2 mg via INTRAVENOUS
  Filled 2014-08-19: qty 2

## 2014-08-19 MED ORDER — SODIUM CHLORIDE 0.9 % IV SOLN
INTRAVENOUS | Status: DC
  Administered 2014-08-19 – 2014-08-21 (×7): via INTRAVENOUS

## 2014-08-19 MED ORDER — ALBUTEROL SULFATE (2.5 MG/3ML) 0.083% IN NEBU: 2.5000 mg | INHALATION_SOLUTION | RESPIRATORY_TRACT | Status: DC | PRN

## 2014-08-19 MED ORDER — MORPHINE SULFATE 2 MG/ML IJ SOLN
2.0000 mg | INTRAMUSCULAR | Status: DC | PRN
  Administered 2014-08-19 – 2014-08-23 (×10): 2 mg via INTRAVENOUS
  Filled 2014-08-19 (×11): qty 1

## 2014-08-19 MED ORDER — FLEET ENEMA 7-19 GM/118ML RE ENEM: 1.0000 | ENEMA | Freq: Once | RECTAL | Status: DC

## 2014-08-19 NOTE — ED Notes (Signed)
Admitting MD and GI doctor at bedside

## 2014-08-19 NOTE — Progress Notes (Signed)
Patient sweating after episode of muscle spasms, CBG checked to verify no hypoglycemia, 160 at this time.

## 2014-08-19 NOTE — Progress Notes (Signed)
ANTIBIOTIC CONSULT NOTE - INITIAL  Pharmacy Consult for Ceftazidime Indication: UTI  Allergies  Allergen Reactions  . Ciprocin-Fluocin-Procin [Fluocinolone Acetonide] Rash    Pt states this happened with IV Cipro. ABLE TO TAKE PO CIPRO.  . Other Rash    IV-CIPRO    Patient Measurements:     Vital Signs: Temp: 98.1 F (36.7 C) (09/14 1102) Temp src: Oral (09/14 0845) BP: 140/75 mmHg (09/14 1430) Pulse Rate: 90 (09/14 1430) Intake/Output from previous day:   Intake/Output from this shift:    Labs:  Recent Labs  08/19/14 1150  WBC 9.9  HGB 15.0  PLT 235  CREATININE 1.06   The CrCl is unknown because both a height and weight (above a minimum accepted value) are required for this calculation. No results found for this basename: VANCOTROUGH, VANCOPEAK, VANCORANDOM, GENTTROUGH, GENTPEAK, GENTRANDOM, TOBRATROUGH, TOBRAPEAK, TOBRARND, AMIKACINPEAK, AMIKACINTROU, AMIKACIN,  in the last 72 hours   Microbiology: No results found for this or any previous visit (from the past 720 hour(s)).  Medical History: Past Medical History  Diagnosis Date  . 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 Diabetes mellitus without complication   . 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 Tumor of optic nerve     right -"stable"  . Headache(784.0)     neuropathic pain usually behind eyes related to blood pressure  . Transfusion history     '09 .   Marland Kitchen Iron deficiency anemia, unspecified 07/10/2014  . HTN (hypertension) 08/19/2014  . 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 Orthostatic hypotension 08/19/2014  . Anemia 08/19/2014     Assessment: 30 yoM with a complicated medical history including metastatic neuroendocrine carcinoma and Von Hiple-Laindau syndrome followed by Dr. Marin Olp, admitted with abdominal pain.  CT abdomen shows sigmoid volvulus.  GI consulted.  Pharmacy consulted to dose ceftazidime for recent UTI sensitive to  ceftazidime per outpatient urology.  Tmax: afebrile WBC: WNL Renal: SCr 1.06, CrCl~85 ml/min (CG) Urine culture ordered.  No UA.  Goal of Therapy:  Doses adjusted per renal function Eradication of infection  Plan:  1.  Ceftazidime 1g IV q8h. 2.  F/u daily.  Hershal Coria 08/19/2014,3:58 PM

## 2014-08-19 NOTE — Progress Notes (Signed)
UR completed 

## 2014-08-19 NOTE — ED Notes (Signed)
2 RNs attempted IV start, no success, Natalie Rn at the bedside with an ultrasound.

## 2014-08-19 NOTE — Progress Notes (Signed)
Patient has not had bowel movement after enema given. Attempt to digitally remove stool from rectum as patient reports this is needed at home. No stool in rectum at this time. On call floor coverage paged to notify. Awaiting call back. Alesia Richards, RN, 08/20/2014, 2357.

## 2014-08-19 NOTE — ED Notes (Signed)
Pt reports pain is coming back, would like some more pain meds, will notify Dr Zenia Resides

## 2014-08-19 NOTE — ED Notes (Signed)
Report given to floor, however pt and family are requesting that pt please be admitted to different floor due to very negative prior experience due to inability of staff to care for his suprapubic catheter. Dr Grandville Silos notified and verbal order received to admit pt to 4th floor (urology)

## 2014-08-19 NOTE — ED Notes (Signed)
Pt coming from home with c/o severe abdominal pain and back spasms; also pt has not had a bowel movement in 4 days; pt has extensive medical hx, is wheelchair bound

## 2014-08-19 NOTE — Consult Note (Signed)
Referring Provider: Dr. Irine Seal (triad hospitalist) Primary Care Physician:  Volanda Napoleon, MD Primary Gastroenterologist:  Dr. Teena Irani  Reason for Consultation:  Abdominal pain and colonic dilation.  HPI: [name]. is a 53 y.o. male being admitted through the emergency room this afternoon. He has a roughly 10 day history of rather diffuse abdominal cramps going up into his back and shoulder blades, not associated with nausea or vomiting, but with a waxing and waning pattern associated with a significant decrease in stool output.  The patient has a history of von Hippel-Lindau disease, and as such, has had multiple tumors resected, including a Whipple procedure for a neuroendocrine tumor. Therefore, he tends to actually have frequent, loose stools, so to go a day or 2 without any bowel movements at all was very unusual for the patient. Last night, take up to the point where even home medications including Vicodin and Dilaudid did not control his pain adequately, so he came to the emergency room. After receiving intravenous pain medicine in the emergency room, the patient has been resting comfortably for approximately the past 10 hours.  A CT scan obtained through the emergency room shows what was initially thought to possibly be a sigmoid volvulus, with diffuse colonic intestinal gas retention and an apparent point of narrowing in the distal colon. However, until from review of that film with the radiologist currently on duty, it appears that sigmoid volvulus is unlikely for several reasons. First, the colon, although redundant, is in the same anatomic position as an his previous abdominal CT scan from a month ago. Secondly, there is no preferential dilatation of the sigmoid region itself. Rather, there is rather diffuse colonic distention, with the point of narrowing felt to be in the mid descending colon, possibly representing an adhesion or a tumor causing subtotal obstruction  (there is gas distally).  The patient's white count and other labs in the emergency room were unremarkable.   Past Medical History  Diagnosis Date  . 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 Diabetes mellitus without complication   . 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 Tumor of optic nerve     right -"stable"  . Headache(784.0)     neuropathic pain usually behind eyes related to blood pressure  . Transfusion history     '09 .   Marland Kitchen Iron deficiency anemia, unspecified 07/10/2014  . HTN (hypertension) 08/19/2014  . 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 Orthostatic hypotension 08/19/2014  . Anemia 08/19/2014    Past Surgical History  Procedure Laterality Date  . Back surgery      '91- T#-T5 "tumor resection"-benign  . Reconstruction breast w/ tram flap    . Muscle flap myocutaneous / fasciocutaneous of trunk Left     '01  . Craniotomy for tumor      medulla tumor "benign tumor"-salvage own bone for closure  . Peripherally inserted central catheter insertion      past hx.  . Enucleation Left     '05 with prosthesis  . Cervical spine surgery      '07- resection tumor C5-C7-"benign"  . Partial nephrectomy Right     '09- cancer  . Pancreas surgery      '09- with partial liver resection/ pancreas "Whipple"  . 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 Insertion of suprapubic catheter N/A 02/04/2014    Procedure: INSERTION OF  SUPRAPUBIC CATHETER;  Surgeon: Franchot Gallo, MD;  Location: [facility];  Service: Urology;  Laterality: N/A;  . Cystoscopy with litholapaxy N/A 02/04/2014    Procedure: CYSTOSCOPY WITH LITHOLAPAXY;  Surgeon: Franchot Gallo, MD;  Location: [facility];  Service: Urology;  Laterality: N/A;    Prior to Admission medications   Medication Sig Start Date End Date Taking? Authorizing Provider  baclofen (LIORESAL) 20 MG tablet Take 20-40 mg by mouth 5 (five) times daily.   Yes  Historical Provider, MD  diazepam (VALIUM) 5 MG tablet Take 5 mg by mouth 3 (three) times daily as needed for muscle spasms.   Yes Historical Provider, MD  ergocalciferol (VITAMIN D2) 50000 UNITS capsule Take 50,000 Units by mouth once a week. On Fridays   Yes Historical Provider, MD  HYDROcodone-acetaminophen (NORCO/VICODIN) 5-325 MG per tablet Take 1 tablet by mouth every 6 (six) hours as needed for moderate pain.   Yes Historical Provider, MD  HYDROmorphone (DILAUDID) 4 MG tablet Take 4 mg by mouth every 6 (six) hours as needed for severe pain.   Yes Historical Provider, MD  insulin glargine (LANTUS) 100 UNIT/ML injection Inject 25 Units into the skin daily before breakfast.    Yes Historical Provider, MD  insulin lispro (HUMALOG) 100 UNIT/ML injection Inject 3-9 Units into the skin 3 (three) times daily before meals. SS   Yes Historical Provider, MD  midodrine (PROAMATINE) 5 MG tablet Take 10 mg by mouth 2 (two) times daily with a meal.   Yes Historical Provider, MD  Pancrelipase, Lip-Prot-Amyl, 24000 UNITS CPEP Take 3 capsules by mouth 3 (three) times daily with meals.   Yes Historical Provider, MD  promethazine (PHENERGAN) 12.5 MG tablet Take 12.5 mg by mouth every 6 (six) hours as needed for nausea.    Yes Historical Provider, MD  tiZANidine (ZANAFLEX) 4 MG tablet Take 4 mg by mouth every 6 (six) hours as needed for muscle spasms.   Yes Historical Provider, MD  traMADol (ULTRAM) 50 MG tablet Take 50 mg by mouth 3 (three) times daily.   Yes Historical Provider, MD    Current Facility-Administered Medications  Medication Dose Route Frequency Provider Last Rate Last Dose  . 0.9 %  sodium chloride infusion   Intravenous Continuous Leota Jacobsen, MD 125 mL/hr at 08/19/14 1148    . cefTAZidime (FORTAZ) 1 g in dextrose 5 % 50 mL IVPB  1 g Intravenous STAT Donald Prose Runyon, RPH      . [START ON 08/20/2014] cefTAZidime (FORTAZ) 1 g in dextrose 5 % 50 mL IVPB  1 g Intravenous Q8H Donald Prose Runyon, RPH       . insulin aspart (novoLOG) injection 0-9 Units  0-9 Units Subcutaneous Q4H Eugenie Filler, MD      . Derrill Memo ON 08/20/2014] insulin glargine (LANTUS) injection 18 Units  18 Units Subcutaneous Daily Irine Seal V, MD      . labetalol (NORMODYNE,TRANDATE) injection 10 mg  10 mg Intravenous Q2H PRN Eugenie Filler, MD      . methocarbamol (ROBAXIN) 500 mg in dextrose 5 % 50 mL IVPB  500 mg Intravenous 3 times per day Irine Seal V, MD      . morphine 2 MG/ML injection 2 mg  2 mg Intravenous Q4H PRN Irine Seal V, MD      . sodium chloride 0.9 % bolus 1,000 mL  1,000 mL Intravenous Once Irine Seal V, MD      . sodium phosphate (FLEET) 7-19 GM/118ML enema 1  enema  1 enema Rectal Once Ronald Lobo V, MD      . sodium phosphate (FLEET) 7-19 GM/118ML enema 1 enema  1 enema Rectal Once Cleotis Nipper, MD       Current Outpatient Prescriptions  Medication Sig Dispense Refill  . baclofen (LIORESAL) 20 MG tablet Take 20-40 mg by mouth 5 (five) times daily.      . diazepam (VALIUM) 5 MG tablet Take 5 mg by mouth 3 (three) times daily as needed for muscle spasms.      . ergocalciferol (VITAMIN D2) 50000 UNITS capsule Take 50,000 Units by mouth once a week. On Fridays      . HYDROcodone-acetaminophen (NORCO/VICODIN) 5-325 MG per tablet Take 1 tablet by mouth every 6 (six) hours as needed for moderate pain.      Marland Kitchen HYDROmorphone (DILAUDID) 4 MG tablet Take 4 mg by mouth every 6 (six) hours as needed for severe pain.      Marland Kitchen insulin glargine (LANTUS) 100 UNIT/ML injection Inject 25 Units into the skin daily before breakfast.       . insulin lispro (HUMALOG) 100 UNIT/ML injection Inject 3-9 Units into the skin 3 (three) times daily before meals. SS      . midodrine (PROAMATINE) 5 MG tablet Take 10 mg by mouth 2 (two) times daily with a meal.      . Pancrelipase, Lip-Prot-Amyl, 24000 UNITS CPEP Take 3 capsules by mouth 3 (three) times daily with meals.      . promethazine (PHENERGAN) 12.5  MG tablet Take 12.5 mg by mouth every 6 (six) hours as needed for nausea.       Marland Kitchen tiZANidine (ZANAFLEX) 4 MG tablet Take 4 mg by mouth every 6 (six) hours as needed for muscle spasms.      . traMADol (ULTRAM) 50 MG tablet Take 50 mg by mouth 3 (three) times daily.        Allergies as of 08/19/2014 - Review Complete 08/19/2014  Allergen Reaction Noted  . Ciprocin-fluocin-procin [fluocinolone acetonide] Rash 06/24/2011  . Other Rash 06/11/2014    History reviewed. No pertinent family history.  History   Social History  . Marital Status: Married    Spouse Name: N/A    Number of Children: N/A  . Years of Education: N/A   Occupational History  . Not on file.   Social History Main Topics  . Smoking status: Former Smoker -- 0.50 packs/day for 6 years    Types: Cigarettes    Start date: 01/15/1999    Quit date: 09/23/2008  . Smokeless tobacco: Never Used     Comment: quit 6 years ago  . Alcohol Use: Yes     Comment: only rare occasions  . Drug Use: No  . Sexual Activity: No   Other Topics Concern  . Not on file   Social History Narrative  . No narrative on file    Review of Systems: No vomiting. Had enemas and MiraLAX at home with modest output of liquid stool; ordinarily, has a tendency toward diarrhea with a couple of loose stools daily.  Physical Exam: Vital signs in last 24 hours: Temp:  [98.1 F (36.7 C)-98.3 F (36.8 C)] 98.1 F (36.7 C) (09/14 1102) Pulse Rate:  [89-116] 116 (09/14 1615) Resp:  [18-20] 18 (09/14 1615) BP: (121-175)/(72-111) 121/73 mmHg (09/14 1615) SpO2:  [98 %-100 %] 99 % (09/14 1430)   General:  This is a drowsy very coherent and articulate Caucasian male paraplegic lying on the  ER stretcher in no distress at this time. Head:  Normocephalic and atraumatic. Eyes:  Sclera clear, no icterus.    Lungs:  Clear throughout to auscultation.   No wheezes, crackles, or rhonchi. No evident respiratory distress. Heart:   Regular rate and rhythm; no  murmurs, clicks, rubs,  or gallops. Abdomen: Suprapubic catheter in place. Soft, nontender, moderately tympanitic, and mildly distended.  No masses, hepatosplenomegaly or ventral hernias noted. Normal soft nonobstructive bowel sounds, without bruits, guarding, or rebound.   Msk:  Paraplegic Extremities:   Without clubbing, cyanosis, or edema. Neurologic:  Alert and coherent; paraplegic appear Skin:  Intact without significant lesions or rashes. Psych:   Alert and cooperative. Normal mood and affect.  Intake/Output from previous day:   Intake/Output this shift:    Lab Results:  Recent Labs  08/19/14 1150  WBC 9.9  HGB 15.0  HCT 45.3  PLT 235   BMET  Recent Labs  08/19/14 1150  NA 133*  K 5.0  CL 90*  CO2 27  GLUCOSE 347*  BUN 24*  CREATININE 1.06  CALCIUM 9.6   LFT  Recent Labs  08/19/14 1150  PROT 8.4*  ALBUMIN 3.3*  AST 23  ALT 17  ALKPHOS 151*  BILITOT 0.5   PT/INR No results found for this basename: LABPROT, INR,  in the last 72 hours  Studies/Results: Ct Abdomen Pelvis W Contrast  08/19/2014   CLINICAL DATA:  Severe abdominal pain and back spasms. No bowel movement in 4 days. History of metastatic neuroendocrine cancer 2009 post chemotherapy and Whipple's procedure. Von Hippel-Lindau syndrome.  EXAM: CT ABDOMEN AND PELVIS WITH CONTRAST  TECHNIQUE: Multidetector CT imaging of the abdomen and pelvis was performed using the standard protocol following bolus administration of intravenous contrast.  CONTRAST:  142mL OMNIPAQUE IOHEXOL 300 MG/ML  SOLN  COMPARISON:  07/09/2014, 04/11/2014 and 06/21/2012  FINDINGS: The lung bases demonstrate a 4 mm nodule over the right lower lobe without significant change from 04/11/2014.  Abdominal images demonstrate diffuse hepatic steatosis. There are several hypervascular enhancing liver masses with the largest more heterogeneous area over the left lobe which appears slightly more prominent measuring 2.9 x 6.9 cm. There is  evidence of patient's prior Whipple procedure without significant residual pancreatic tissue. There has been a prior cholecystectomy. Minimal pneumobilia over the left lobe unchanged. The spleen and adrenal glands are within normal. Stomach is somewhat distended. There is calcified plaque over the abdominal aorta. Numerous collateral arterial vessels over the anterior and left mid abdomen.  There are no dilated small bowel loops. There is moderate air and fecal retention over the ascending and transverse colon extending towards the splenic flexure and proximal descending colon. There is moderate redundancy of the sigmoid and descending colon. There is an abrupt caliber change of the descending colon in the mid abdomen just left of midline with a whirling of the mesentery across the midline with air and stool seen within the colon distal to this point in the distal descending and rectosigmoid colon. This likely represents a colonic volvulus which may be intermittent. No evidence of free peritoneal air. No free fluid.  Kidneys are normal size with numerous simple and hyperdense cysts. There are several enhancing solid and cystic renal masses bilaterally left worse than right with the largest measuring 3.1 x 3.7 cm over the mid pole of the left kidney likely multiple renal cell carcinomas without significant change. Several left renal stones unchanged. Multiple metallic densities over the right kidney likely postsurgical. There  is no hydronephrosis. Ureters are within normal.  Pelvic images demonstrate a suprapubic catheter in adequate position. There is surface retro bladder wall thickening. Remaining pelvic structures are unchanged. There are multiple areas of enhancement within the spinal canal unchanged. Remainder the exam is unchanged.  IMPRESSION: Redundancy of the sigmoid and descending colon with findings suggesting volvulus over the mid descending colon just left of midline in the mid abdomen which may be  intermittent as air and stool are seen distal to this abrupt region of narrowing.  Changes compatible patient's prior Whipple procedure. Multiple hypervascular lesions within the liver with slight progression as described. 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  4 mm right lower lobe nodule unchanged from May 2015.  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  Other chronic axillary findings unchanged.  Critical Value/emergent results were called by telephone at the time of interpretation on 08/19/2014 at 2:09 pm to Dr. Lacretia Leigh , who verbally acknowledged these results.   Electronically Signed   By: Marin Olp M.D.   On: 08/19/2014 14:09   Dg Abd Acute W/chest  08/19/2014   CLINICAL DATA:  Abdominal pain; history of Whipple procedure 2 years ago and history of previous right nephrectomy  EXAM: ACUTE ABDOMEN SERIES (ABDOMEN 2 VIEW & CHEST 1 VIEW)  COMPARISON:  CT scan of the abdomen and pelvis of July 09, 2014  FINDINGS: The left lung is clear. On the right the aerated portion of the lung is clear. The hemidiaphragm is elevated. The heart is top-normal in size. The pulmonary vascularity is not engorged. Within the abdomen there are loops of moderately distended gas-filled small and large bowel. No definite rectal gas is demonstrated. No free extraluminal gas collections are demonstrated. There are numerous surgical clips within the abdomen as well as within the pelvis.  IMPRESSION: Generalized ileus versus distal colonic obstructive pattern. There is no evidence of perforation. No acute  cardiopulmonary abnormality is demonstrated here   Electronically Signed   By: David  Martinique   On: 08/19/2014 09:45    Impression: 1. Recent spasm-like abdominal pain, nonspecific in character 2. Colonic dilatation on CT with apparent point of narrowing in the mid descending colon 3. Von Hippel-Lindau disease, status post Whipple and multiple other tumor resections for  Plan: Fleet enemas tonight to clear out that the distal colon, with decompressive colonoscopy (nature, purpose, risks reviewed with patient and wife at bedside) tomorrow morning under propofol sedation. I discussed the option of a Gastrografin enema with the radiologist, but she felt that his colon was so redundant that it might be difficult to get adequate filling of the more proximal colon.  If we are unable to define a point of narrowing during his exam, we will simply try to decompress his much gases we can and observe the patient clinically and radiographically thereafter. If there appears to be an anatomic point of narrowing, we may need to consider surgical consultation.  Cleotis Nipper, M.D. [phone]     LOS: 0 days   Cleotis Nipper  08/19/2014, 4:22 PM

## 2014-08-19 NOTE — ED Notes (Signed)
Unable to start an PIV, second RN to assess.

## 2014-08-19 NOTE — Anesthesia Preprocedure Evaluation (Addendum)
Anesthesia Evaluation  Patient identified by MRN, date of birth, ID band Patient awake    Reviewed: Allergy & Precautions, H&P , NPO status , Patient's Chart, lab work & pertinent test results  Airway Mallampati: II TM Distance: >3 FB Neck ROM: Full    Dental  (+) Teeth Intact, Dental Advisory Given   Pulmonary neg pulmonary ROS, former smoker,  breath sounds clear to auscultation  Pulmonary exam normal       Cardiovascular hypertension, negative cardio ROS  Rhythm:Regular Rate:Normal     Neuro/Psych  Headaches, Von Hippel- Lindau syndrome; T4 paraplegic post-surgical Numerous procedures for VHL related problems. negative psych ROS   GI/Hepatic negative GI ROS, Neg liver ROS,   Endo/Other  diabetes, Type 1, Insulin Dependent  Renal/GU negative Renal ROS Bladder dysfunction   negative genitourinary   Musculoskeletal negative musculoskeletal ROS (+)   Abdominal   Peds  Hematology negative hematology ROS (+)   Anesthesia Other Findings   Reproductive/Obstetrics                         Anesthesia Physical  Anesthesia Plan  ASA: III  Anesthesia Plan: MAC   Post-op Pain Management:    Induction: Intravenous  Airway Management Planned:   Additional Equipment:   Intra-op Plan:   Post-operative Plan:   Informed Consent: I have reviewed the patients History and Physical, chart, labs and discussed the procedure including the risks, benefits and alternatives for the proposed anesthesia with the patient or authorized representative who has indicated his/her understanding and acceptance.   Dental advisory given  Plan Discussed with: CRNA  Anesthesia Plan Comments:         Anesthesia Quick Evaluation

## 2014-08-19 NOTE — H&P (Addendum)
[facility] History and Physical  Mihcael A [employer]. LSL:[id number] DOB: [birth year] DOA: 08/19/2014  Referring physician: Dr. Zenia Resides PCP: Volanda Napoleon, MD   Chief Complaint: Abdominal pain  HPI: Linna Darner. is a 53 y.o. male  Unfortunate gentleman with a history of von Hippel-Lindau syndrome,. 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 The patient and wife the symptoms started 1-1/2 weeks ago which also included sweats abdominal pain with spasms which have been intermittent as well as labile blood pressure. Per patient and wife it was thought that patient's symptoms were likely secondary to constipation and they spoke with gastroenterology who recommended a trial of laxatives. Patient had 3 doses of MiraLAX 3 days prior to admission and had a moderate amount of some liquid stool. Patient continued to have abdominal spasms which were excruciating and moving up to his shoulders bilaterally with some associated headache 1-2 days prior to admission. Patient subsequently presented to the emergency room. Patient doesn't go as low grade fever with a temp of 100.1, some nausea. Patient denies any chest pain, no shortness of breath, no hemorrhage emesis, no emesis, no melena, no hematochezia, no dysuria, no cough. Patient had been seen by urology 5 days prior to admission diagnosed with a urinary tract infection which her urologist grew out Proteus Maribel is an Escherichia coli. Patient was placed on Septra. Patient presented to the emergency room, comprehensive metabolic profile done on a chloride of 90 sodium of 1:30. BUN of 24 glucose of 347 alk phosphatase of 151 albumin of 3.3 protein of 8.4 otherwise was  within normal limits. Lipase level was at 6. CBC which was done was unremarkable. Acute abdominal series which was done was concern of a generalized ileus versus distal colonic obstructive pattern with no evidence of perforation in no acute cardiopulmonary abnormality. CT of the abdomen and pelvis which was done was concerning for volvulus versus partial obstruction. ED physician consulted GI. [facility] was called to admit the patient for further evaluation and management.   Review of Systems: As per history of present illness otherwise negative Constitutional:  No weight loss, night sweats, Fevers, chills, fatigue.  HEENT:  No headaches, Difficulty swallowing,Tooth/dental problems,Sore throat,  No sneezing, itching, ear ache, nasal congestion, post nasal drip,  Cardio-vascular:  No chest pain, Orthopnea, PND, swelling in lower extremities, anasarca, dizziness, palpitations  GI:  No heartburn, indigestion, abdominal pain, nausea, vomiting, diarrhea, change in bowel habits, loss of appetite  Resp:  No shortness of breath with exertion or at rest. No excess mucus, no productive cough, No non-productive cough, No coughing up of blood.No change in color of mucus.No wheezing.No chest wall deformity  Skin:  no rash or lesions.  GU:  no dysuria, change in color of urine, no urgency or frequency. No flank pain.  Musculoskeletal:  No joint pain or swelling. No decreased range of motion. No back pain.  Psych:  No change in mood or affect. No depression or anxiety. No memory loss.   Past Medical History  Diagnosis Date  . 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 Diabetes mellitus without complication   . 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 Tumor of optic nerve     right -"stable"  .  Headache(784.0)     neuropathic pain usually behind eyes related to blood pressure  . Transfusion history     '09 .   Marland Kitchen Iron deficiency anemia, unspecified  07/10/2014  . HTN (hypertension) 08/19/2014  . 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 Orthostatic hypotension 08/19/2014  . Anemia 08/19/2014   Past Surgical History  Procedure Laterality Date  . Back surgery      '91- T#-T5 "tumor resection"-benign  . Reconstruction breast w/ tram flap    . Muscle flap myocutaneous / fasciocutaneous of trunk Left     '01  . Craniotomy for tumor      medulla tumor "benign tumor"-salvage own bone for closure  . Peripherally inserted central catheter insertion      past hx.  . Enucleation Left     '05 with prosthesis  . Cervical spine surgery      '07- resection tumor C5-C7-"benign"  . Partial nephrectomy Right     '09- cancer  . Pancreas surgery      '09- with partial liver resection/ pancreas "Whipple"  . 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 Insertion of suprapubic catheter N/A 02/04/2014    Procedure: INSERTION OF SUPRAPUBIC CATHETER;  Surgeon: Franchot Gallo, MD;  Location: [facility];  Service: Urology;  Laterality: N/A;  . Cystoscopy with litholapaxy N/A 02/04/2014    Procedure: CYSTOSCOPY WITH LITHOLAPAXY;  Surgeon: Franchot Gallo, MD;  Location: [facility];  Service: Urology;  Laterality: N/A;   Social History:  reports that he quit smoking about 5 years ago. His smoking use included Cigarettes. He started smoking about 15 years ago. He has a 3 pack-year smoking history. He has never used smokeless tobacco. He reports that he drinks alcohol. He reports that he does not use illicit drugs.  Allergies  Allergen Reactions  . Ciprocin-Fluocin-Procin [Fluocinolone Acetonide] Rash    Pt states this happened with IV Cipro. ABLE TO TAKE PO CIPRO.  . Other Rash    IV-CIPRO    History reviewed. No pertinent family history.   Prior to Admission medications   Medication Sig Start Date End Date Taking? Authorizing Provider  baclofen (LIORESAL) 20 MG tablet Take 20-40 mg by mouth 5 (five) times daily.    Yes Historical Provider, MD  diazepam (VALIUM) 5 MG tablet Take 5 mg by mouth 3 (three) times daily as needed for muscle spasms.   Yes Historical Provider, MD  ergocalciferol (VITAMIN D2) 50000 UNITS capsule Take 50,000 Units by mouth once a week. On Fridays   Yes Historical Provider, MD  HYDROcodone-acetaminophen (NORCO/VICODIN) 5-325 MG per tablet Take 1 tablet by mouth every 6 (six) hours as needed for moderate pain.   Yes Historical Provider, MD  HYDROmorphone (DILAUDID) 4 MG tablet Take 4 mg by mouth every 6 (six) hours as needed for severe pain.   Yes Historical Provider, MD  insulin glargine (LANTUS) 100 UNIT/ML injection Inject 25 Units into the skin daily before breakfast.    Yes Historical Provider, MD  insulin lispro (HUMALOG) 100 UNIT/ML injection Inject 3-9 Units into the skin 3 (three) times daily before meals. SS   Yes Historical Provider, MD  midodrine (PROAMATINE) 5 MG tablet Take 10 mg by mouth 2 (two) times daily with a meal.   Yes Historical Provider, MD  Pancrelipase, Lip-Prot-Amyl, 24000 UNITS CPEP Take 3 capsules by mouth 3 (three) times daily with meals.   Yes Historical Provider, MD  promethazine (PHENERGAN) 12.5 MG  tablet Take 12.5 mg by mouth every 6 (six) hours as needed for nausea.    Yes Historical Provider, MD  tiZANidine (ZANAFLEX) 4 MG tablet Take 4 mg by mouth every 6 (six) hours as needed for muscle spasms.   Yes Historical Provider, MD  traMADol (ULTRAM) 50 MG tablet Take 50 mg by mouth 3 (three) times daily.   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 Speaking in full sentences.  Eyes: PERRLA in the right eye. 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 ENT: grossly normal hearing, lips & tongue. Dry mucous membranes. Neck: no LAD, masses or thyromegaly Cardiovascular: Tachycardic, no m/r/g. No LE edema Respiratory: CTA bilaterally, no w/r/r. Normal respiratory effort. Abdomen: soft, ntnd, soft, hypoactive bowel sounds, no rebound, no guarding Skin: no rash or induration seen on limited exam Musculoskeletal: grossly normal tone BUE/BLE. Paraplegia Psychiatric: grossly normal mood and affect, speech fluent and appropriate Neurologic: Alert and oriented x 3. Paraplegic.           Labs on Admission:  Basic Metabolic Panel:  Recent Labs Lab 08/19/14 1150  NA 133*  K 5.0  CL 90*  CO2 27  GLUCOSE 347*  BUN 24*  CREATININE 1.06  CALCIUM 9.6   Liver Function Tests:  Recent Labs Lab 08/19/14 1150  AST 23  ALT 17  ALKPHOS 151*  BILITOT 0.5  PROT 8.4*  ALBUMIN 3.3*    Recent Labs Lab 08/19/14 1150  LIPASE 6*   No results found for this basename: AMMONIA,  in the last 168 hours CBC:  Recent Labs Lab 08/19/14 1150  WBC 9.9  NEUTROABS 7.4  HGB 15.0  HCT 45.3  MCV 80.9  PLT 235   Cardiac Enzymes: No results found for this basename: CKTOTAL, CKMB, CKMBINDEX, TROPONINI,  in the last 168 hours  BNP (last 3 results) No results found for this basename: PROBNP,  in the last 8760 hours CBG: No results found for this basename: GLUCAP,  in the last 168 hours  Radiological Exams on Admission: Ct Abdomen Pelvis W Contrast  08/19/2014   CLINICAL DATA:  Severe abdominal pain and back spasms. No bowel movement in 4 days. History of metastatic neuroendocrine cancer 2009 post chemotherapy and Whipple's procedure. Von Hippel-Lindau syndrome.  EXAM: CT ABDOMEN AND PELVIS WITH CONTRAST  TECHNIQUE: Multidetector CT imaging of the abdomen and pelvis was performed using the standard protocol following bolus administration of intravenous  contrast.  CONTRAST:  159m OMNIPAQUE IOHEXOL 300 MG/ML  SOLN  COMPARISON:  07/09/2014, 04/11/2014 and 06/21/2012  FINDINGS: The lung bases demonstrate a 4 mm nodule over the right lower lobe without significant change from 04/11/2014.  Abdominal images demonstrate diffuse hepatic steatosis. There are several hypervascular enhancing liver masses with the largest more heterogeneous area over the left lobe which appears slightly more prominent measuring 2.9 x 6.9 cm. There is evidence of patient's prior Whipple procedure without significant residual pancreatic tissue. There has been a prior cholecystectomy.  Minimal pneumobilia over the left lobe unchanged. The spleen and adrenal glands are within normal. Stomach is somewhat distended. There is calcified plaque over the abdominal aorta. Numerous collateral arterial vessels over the anterior and left mid abdomen.  There are no dilated small bowel loops. There is moderate air and fecal retention over the ascending and transverse colon extending towards the splenic flexure and proximal descending colon. There is moderate redundancy of the sigmoid and descending colon. There is an abrupt caliber change of the descending colon in the mid abdomen just left of midline with a whirling of the mesentery across the midline with air and stool seen within the colon distal to this point in the distal descending and rectosigmoid colon. This likely represents a colonic volvulus which may be intermittent. No evidence of free peritoneal air. No free fluid.  Kidneys are normal size with numerous simple and hyperdense cysts. There are several enhancing solid and cystic renal masses bilaterally left worse than right with the largest measuring 3.1 x 3.7 cm over the mid pole of the left kidney likely multiple renal cell carcinomas without significant change. Several left renal stones unchanged. Multiple metallic densities over the right kidney likely postsurgical. There is no hydronephrosis.  Ureters are within normal.  Pelvic images demonstrate a suprapubic catheter in adequate position. There is surface retro bladder wall thickening. Remaining pelvic structures are unchanged. There are multiple areas of enhancement within the spinal canal unchanged. Remainder the exam is unchanged.  IMPRESSION: Redundancy of the sigmoid and descending colon with findings suggesting volvulus over the mid descending colon just left of midline in the mid abdomen which may be intermittent as air and stool are seen distal to this abrupt region of narrowing.  Changes compatible patient's prior Whipple procedure. Multiple hypervascular lesions within the liver with slight progression as described. 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  4 mm right lower lobe nodule unchanged from May 2015.  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  Other chronic axillary findings unchanged.  Critical Value/emergent results were called by telephone at the time of interpretation on 08/19/2014 at 2:09 pm to Dr. Lacretia Leigh , who verbally acknowledged these results.   Electronically Signed   By: Marin Olp M.D.   On: 08/19/2014 14:09   Dg Abd Acute W/chest  08/19/2014   CLINICAL DATA:  Abdominal pain; history of Whipple procedure 2 years ago and history of previous right nephrectomy  EXAM: ACUTE ABDOMEN SERIES (ABDOMEN 2 VIEW & CHEST 1 VIEW)  COMPARISON:  CT scan of the abdomen and pelvis of July 09, 2014  FINDINGS: The left lung is clear. On the right the aerated portion of the lung is clear. The hemidiaphragm is elevated.  The heart is top-normal in size. The pulmonary vascularity is not engorged. Within the abdomen there are loops of moderately distended gas-filled small and large bowel. No definite rectal gas is demonstrated. No free extraluminal gas collections are demonstrated. There are numerous surgical clips within the abdomen as well as within the pelvis.  IMPRESSION: Generalized ileus versus distal colonic obstructive pattern. There is no evidence of perforation. No acute cardiopulmonary abnormality  is demonstrated here   Electronically Signed   By: David  Martinique   On: 08/19/2014 09:45    EKG: None  Assessment/Plan Principal Problem:   Sigmoid volvulus Active Problems:   Neuroendocrine carcinoma   Neurogenic bladder   Iron deficiency anemia, unspecified   HTN (hypertension)   Diabetes mellitus secondary to pancreatectomy   Orthostatic hypotension   Dehydration   Anemia   UTI (lower urinary tract infection)   #1 sigmoid volvulus versus partial bowel obstruction Patient had presented with abdominal cramping and pain ongoing intermittent for the past 1-1/2 weeks. Abdominal x-rays which were done were concerning for ileus versus obstruction. CT of the abdomen and pelvis which was done was concerning for volvulus. Patient is currently n.p.o. Will admit to MedSurg bed. Continue bowel rest. Place on IV fluids. Supportive care. GI has been consulted for further evaluation and management.  #2 labile hypertension Patient has been noted to have significant ranges with his blood pressure. Per wife patient blood pressures been as high as 253. Due to patient's history of Von Hippel Landau syndrome, will check urine and serum metanephrines and catecholamines to rule out pheochromocytoma. Place on when necessary labetalol for systolic blood pressure greater than 180. Follow.   #3 recent UTI Patient diagnosed with a UTI 5 days prior to admission which per urologist grew Proteus Mirabilis and Escherichia coli.  Patient is currently n.p.o. and a such will place on IV Fortaz  #4 iron deficiency anemia H&H stable follow.  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 Place on half home dose of Lantus as patient will be kept n.p.o. Sliding scale insulin.  #6 history of orthostatic hypotension Continue monitoring.  #7 dehydration IV fluids.  #8 history of neuroendocrine metastatic carcinoma/von Hippel-Lindau syndrome Continue Sandostatin monthly. Followup with doctor and never as outpatient.  #9 prophylaxis Lovenox for DVT prophylaxis.  Code Status: Full DVT Prophylaxis: Lovenox Family Communication: updated patient and wife at bedside. Disposition Plan: Admit to medsurg  Time spent: 75 mins  [facility] MD [facility] Pager [phone]  **Disclaimer: This note may have been dictated with voice recognition software. Similar sounding words can inadvertently be transcribed and this note may contain transcription errors which may not have been corrected upon publication of note.**

## 2014-08-19 NOTE — ED Provider Notes (Signed)
CSN: [id number]     Arrival date & time 08/19/14  7322 History   First MD Initiated Contact with Patient 08/19/14 [phone]     Chief Complaint  Patient presents with  . Abdominal Pain     (Consider location/radiation/quality/duration/timing/severity/associated sxs/prior Treatment) HPI Comments: Patient here complaining of abdominal cramping and increase constipation with associated muscle spasms. Patient has a very topics medical history and is followed by Dr. Marin Olp. He denies any fever, chills, vomiting. Has been using laxatives as well as enema for his constipation. Spasms in his back start at the lower portion radiate upwards. He has used Valium and his oral opiates at home without relief of the spasms. He has been seen by urology recently and had a urine culture performed which is been followed by them. He is wheelchair-bound and has spasms are somewhat affected by positions. Symptoms have been persistent and nothing makes it worse.  Patient is a 53 y.o. male presenting with abdominal pain. The history is provided by the patient and the spouse.  Abdominal Pain   Past Medical History  Diagnosis Date  . 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 Diabetes mellitus without complication   . 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 Tumor of optic nerve     right -"stable"  . Headache(784.0)     neuropathic pain usually behind eyes related to blood pressure  . Transfusion history     '09 .   Marland Kitchen Iron deficiency anemia, unspecified 07/10/2014   Past Surgical History  Procedure Laterality Date  . Back surgery      '91- T#-T5 "tumor resection"-benign  . Reconstruction breast w/ tram flap    . Muscle flap myocutaneous / fasciocutaneous of trunk Left     '01  . Craniotomy for tumor      medulla tumor "benign tumor"-salvage own bone for closure  . Peripherally inserted central catheter insertion      past hx.  . Enucleation Left     '05 with prosthesis  .  Cervical spine surgery      '07- resection tumor C5-C7-"benign"  . Partial nephrectomy Right     '09- cancer  . Pancreas surgery      '09- with partial liver resection/ pancreas "Whipple"  . 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 Insertion of suprapubic catheter N/A 02/04/2014    Procedure: INSERTION OF SUPRAPUBIC CATHETER;  Surgeon: Franchot Gallo, MD;  Location: [facility];  Service: Urology;  Laterality: N/A;  . Cystoscopy with litholapaxy N/A 02/04/2014    Procedure: CYSTOSCOPY WITH LITHOLAPAXY;  Surgeon: Franchot Gallo, MD;  Location: [facility];  Service: Urology;  Laterality: N/A;   History reviewed. No pertinent family history. History  Substance Use Topics  . Smoking status: Former Smoker -- 0.50 packs/day for 6 years    Types: Cigarettes    Start date: 01/15/1999    Quit date: 09/23/2008  . Smokeless tobacco: Never Used     Comment: quit 6 years ago  . Alcohol Use: Yes     Comment: only rare occasions    Review of Systems  Gastrointestinal: Positive for abdominal pain.  All other systems reviewed and are negative.     Allergies  Ciprocin-fluocin-procin and Other  Home Medications   Prior to Admission medications   Medication Sig Start Date End Date Taking? Authorizing Provider  baclofen (LIORESAL) 20 MG tablet Take 1-2 tablets (20-40 mg  total) by mouth 5 (five) times daily. TAKES 1 TABLET  4 TIMES DAILY AND 2 AT BEDTIME 02/12/14   Volanda Napoleon, MD  diazepam (VALIUM) 5 MG tablet TAKE 1 TABLET BY MOUTH 3 TIMES DAILY AS NEEDED FOR MUSCLE SPASMS 06/13/14   Volanda Napoleon, MD  ergocalciferol (VITAMIN D2) 50000 UNITS capsule Take 1 capsule (50,000 Units total) by mouth once a week. 07/11/14   Volanda Napoleon, MD  HYDROcodone-acetaminophen (VICODIN) 5-500 MG per tablet Take 0.5-1 tablets by mouth every 6 (six) hours as needed for pain. 02/12/14   Volanda Napoleon, MD  insulin glargine (LANTUS) 100 UNIT/ML injection Inject 25 Units into the skin  daily before breakfast.     Historical Provider, MD  insulin lispro (HUMALOG) 100 UNIT/ML injection Inject 3-9 Units into the skin 3 (three) times daily before meals. SS    Historical Provider, MD  midodrine (PROAMATINE) 5 MG tablet Take 2 tablets (10 mg total) by mouth as needed. 07/24/14   Volanda Napoleon, MD  Pancrelipase, Lip-Prot-Amyl, 24000 UNITS CPEP Take 3 capsules by mouth 3 (three) times daily with meals.    Historical Provider, MD  pantoprazole (PROTONIX) 40 MG tablet Take 1 tablet (40 mg total) by mouth daily. 05/15/14   Belkys A Regalado, MD  promethazine (PHENERGAN) 12.5 MG tablet Take 12.5 mg by mouth every 6 (six) hours as needed for nausea.     Historical Provider, MD  tiZANidine (ZANAFLEX) 4 MG tablet Take 4 mg by mouth every 6 (six) hours as needed for muscle spasms.    Historical Provider, MD  traMADol (ULTRAM) 50 MG tablet Take 50 mg by mouth 3 (three) times daily.    Historical Provider, MD   BP 128/102  Pulse 107  Resp 20  SpO2 100% Physical Exam  Nursing note and vitals reviewed. Constitutional: He is oriented to person, place, and time. He appears well-developed and well-nourished.  Non-toxic appearance. No distress.  HENT:  Head: Normocephalic and atraumatic.  Eyes: Conjunctivae, EOM and lids are normal. Pupils are equal, round, and reactive to light.  Neck: Normal range of motion. Neck supple. No tracheal deviation present. No mass present.  Cardiovascular: Regular rhythm and normal heart sounds.  Tachycardia present.  Exam reveals no gallop.   No murmur heard. Pulmonary/Chest: Effort normal and breath sounds normal. No stridor. No respiratory distress. He has no decreased breath sounds. He has no wheezes. He has no rhonchi. He has no rales.  Abdominal: Soft. Normal appearance and bowel sounds are normal. He exhibits no distension. There is no tenderness. There is no rigidity, no rebound, no guarding and no CVA tenderness.  Musculoskeletal: Normal range of motion. He  exhibits no edema and no tenderness.  Neurological: He is alert and oriented to person, place, and time. No cranial nerve deficit. GCS eye subscore is 4. GCS verbal subscore is 5. GCS motor subscore is 6.  Skin: Skin is warm and dry. No abrasion and no rash noted.  Psychiatric: He has a normal mood and affect. His speech is normal and behavior is normal.    ED Course  Procedures (including critical care time) Labs Review Labs Reviewed - No data to display  Imaging Review No results found.   EKG Interpretation None      MDM   Final diagnoses:  None   Patient's abdominal CT results noted and I have spoken with the gastroenterologist on call Dr. Amedeo Plenty. He recommends the patient be admitted [facility] and he  will see the patient in consultation. Patient given IV fluids and pain meds here and is resting comfortably     Leota Jacobsen, MD 08/19/14 1429

## 2014-08-19 NOTE — ED Notes (Signed)
Pt is requesting to stay in his wheelchair at this time, sts it's a lot more comfortable. Explained to pt that he may have to be moved to stretcher for doctor's exam, pt voiced understanding.

## 2014-08-20 ENCOUNTER — Encounter (HOSPITAL_COMMUNITY): Payer: 59 | Admitting: Anesthesiology

## 2014-08-20 ENCOUNTER — Encounter (HOSPITAL_COMMUNITY)
Admission: EM | Disposition: A | Discharge status: Home / Self Care | Payer: Self-pay | Source: Home / Self Care | Attending: Internal Medicine

## 2014-08-20 ENCOUNTER — Inpatient Hospital Stay (HOSPITAL_COMMUNITY): Payer: 59

## 2014-08-20 ENCOUNTER — Inpatient Hospital Stay (HOSPITAL_COMMUNITY): Payer: 59 | Admitting: Anesthesiology

## 2014-08-20 ENCOUNTER — Encounter (HOSPITAL_COMMUNITY): Payer: Self-pay | Admitting: *Deleted

## 2014-08-20 HISTORY — PX: COLONOSCOPY WITH PROPOFOL: SHX5780

## 2014-08-20 LAB — CBC WITH DIFFERENTIAL/PLATELET
BASOS ABS: 0 10*3/uL (ref 0.0–0.1)
BASOS PCT: 0 % (ref 0–1)
Eosinophils Absolute: 0.1 10*3/uL (ref 0.0–0.7)
Eosinophils Relative: 1 % (ref 0–5)
HCT: 40.9 % (ref 39.0–52.0)
Hemoglobin: 13.5 g/dL (ref 13.0–17.0)
LYMPHS PCT: 21 % (ref 12–46)
Lymphs Abs: 1.9 10*3/uL (ref 0.7–4.0)
MCH: 26.5 pg (ref 26.0–34.0)
MCHC: 33 g/dL (ref 30.0–36.0)
MCV: 80.2 fL (ref 78.0–100.0)
Monocytes Absolute: 0.9 10*3/uL (ref 0.1–1.0)
Monocytes Relative: 10 % (ref 3–12)
NEUTROS ABS: 6.4 10*3/uL (ref 1.7–7.7)
NEUTROS PCT: 68 % (ref 43–77)
Platelets: 228 10*3/uL (ref 150–400)
RBC: 5.1 MIL/uL (ref 4.22–5.81)
RDW: 16.4 % — AB (ref 11.5–15.5)
WBC: 9.3 10*3/uL (ref 4.0–10.5)

## 2014-08-20 LAB — GLUCOSE, CAPILLARY
GLUCOSE-CAPILLARY: 120 mg/dL — AB (ref 70–99)
GLUCOSE-CAPILLARY: 122 mg/dL — AB (ref 70–99)
GLUCOSE-CAPILLARY: 125 mg/dL — AB (ref 70–99)
GLUCOSE-CAPILLARY: 137 mg/dL — AB (ref 70–99)
GLUCOSE-CAPILLARY: 156 mg/dL — AB (ref 70–99)
GLUCOSE-CAPILLARY: 69 mg/dL — AB (ref 70–99)
Glucose-Capillary: 117 mg/dL — ABNORMAL HIGH (ref 70–99)
Glucose-Capillary: 101 mg/dL — ABNORMAL HIGH (ref 70–99)
Glucose-Capillary: 206 mg/dL — ABNORMAL HIGH (ref 70–99)

## 2014-08-20 LAB — COMPREHENSIVE METABOLIC PANEL
ALBUMIN: 2.9 g/dL — AB (ref 3.5–5.2)
ALK PHOS: 118 U/L — AB (ref 39–117)
ALT: 12 U/L (ref 0–53)
ANION GAP: 18 — AB (ref 5–15)
AST: 18 U/L (ref 0–37)
BUN: 25 mg/dL — AB (ref 6–23)
CO2: 24 mEq/L (ref 19–32)
CREATININE: 1.06 mg/dL (ref 0.50–1.35)
Calcium: 8.8 mg/dL (ref 8.4–10.5)
Chloride: 102 mEq/L (ref 96–112)
GFR calc non Af Amer: 80 mL/min — ABNORMAL LOW (ref >90)
GLUCOSE: 129 mg/dL — AB (ref 70–99)
POTASSIUM: 3.7 meq/L (ref 3.7–5.3)
Sodium: 144 mEq/L (ref 137–147)
TOTAL PROTEIN: 7.4 g/dL (ref 6.0–8.3)
Total Bilirubin: 0.3 mg/dL (ref 0.3–1.2)

## 2014-08-20 LAB — HEMOGLOBIN A1C
Hgb A1c MFr Bld: 7.8 % — ABNORMAL HIGH (ref <5.7)
MEAN PLASMA GLUCOSE: 177 mg/dL — AB (ref <117)

## 2014-08-20 LAB — PROTIME-INR
INR: 1.13 (ref 0.00–1.49)
Prothrombin Time: 14.5 seconds (ref 11.6–15.2)

## 2014-08-20 SURGERY — COLONOSCOPY WITH PROPOFOL
Anesthesia: Monitor Anesthesia Care

## 2014-08-20 MED ORDER — SODIUM CHLORIDE 0.9 % IJ SOLN
INTRAMUSCULAR | Status: AC
  Filled 2014-08-20: qty 10

## 2014-08-20 MED ORDER — EPHEDRINE SULFATE 50 MG/ML IJ SOLN
INTRAMUSCULAR | Status: AC
Start: 2014-08-20 — End: 2014-08-20
  Filled 2014-08-20: qty 1

## 2014-08-20 MED ORDER — FLEET ENEMA 7-19 GM/118ML RE ENEM
1.0000 | ENEMA | Freq: Once | RECTAL | Status: AC
  Administered 2014-08-20: 1 via RECTAL
  Filled 2014-08-20: qty 1

## 2014-08-20 MED ORDER — DIAZEPAM 5 MG/ML IJ SOLN
5.0000 mg | Freq: Three times a day (TID) | INTRAMUSCULAR | Status: DC
  Administered 2014-08-20 – 2014-08-23 (×8): 5 mg via INTRAVENOUS
  Filled 2014-08-20 (×8): qty 2

## 2014-08-20 MED ORDER — BACLOFEN 20 MG PO TABS
20.0000 mg | ORAL_TABLET | Freq: Every day | ORAL | Status: DC
  Administered 2014-08-20 (×2): 40 mg via ORAL
  Administered 2014-08-20: 20 mg via ORAL
  Administered 2014-08-20 – 2014-08-21 (×2): 40 mg via ORAL
  Filled 2014-08-20 (×10): qty 2

## 2014-08-20 MED ORDER — PROPOFOL 10 MG/ML IV BOLUS
INTRAVENOUS | Status: AC
  Filled 2014-08-20: qty 20

## 2014-08-20 MED ORDER — TIZANIDINE HCL 4 MG PO TABS
4.0000 mg | ORAL_TABLET | Freq: Four times a day (QID) | ORAL | Status: DC | PRN
  Administered 2014-08-20 – 2014-08-22 (×2): 4 mg via ORAL
  Filled 2014-08-20 (×2): qty 1

## 2014-08-20 MED ORDER — POLYETHYLENE GLYCOL 3350 17 G PO PACK
17.0000 g | PACK | Freq: Three times a day (TID) | ORAL | Status: DC
  Administered 2014-08-20 – 2014-08-21 (×4): 17 g via ORAL
  Filled 2014-08-20 (×6): qty 1

## 2014-08-20 MED ORDER — PROPOFOL 10 MG/ML IV EMUL
INTRAVENOUS | Status: DC | PRN
  Administered 2014-08-20: 40 mg via INTRAVENOUS

## 2014-08-20 MED ORDER — PROPOFOL INFUSION 10 MG/ML OPTIME
INTRAVENOUS | Status: DC | PRN
  Administered 2014-08-20: 140 ug/kg/min via INTRAVENOUS

## 2014-08-20 MED ORDER — LIDOCAINE HCL (CARDIAC) 20 MG/ML IV SOLN
INTRAVENOUS | Status: AC
  Filled 2014-08-20: qty 5

## 2014-08-20 SURGICAL SUPPLY — 22 items

## 2014-08-20 NOTE — Transfer of Care (Signed)
Immediate Anesthesia Transfer of Care Note  Patient: [name].  Procedure(s) Performed: Procedure(s): COLONOSCOPY WITH PROPOFOL (N/A)  Patient Location: PACU  Anesthesia Type:MAC  Level of Consciousness: awake, alert  and oriented  Airway & Oxygen Therapy: Patient Spontanous Breathing and Patient connected to face mask oxygen  Post-op Assessment: Report given to PACU RN and Post -op Vital signs reviewed and stable  Post vital signs: Reviewed and stable  Complications: No apparent anesthesia complications

## 2014-08-20 NOTE — Care Management Note (Addendum)
    Page 1 of 1   08/23/2014     1:39:47 PM CARE MANAGEMENT NOTE 08/23/2014  Patient:  CODA, MATHEY A   Account Number:  [id number]  Date Initiated:  08/20/2014  Documentation initiated by:  Dessa Phi  Subjective/Objective Assessment:   53 Y/O M ADMITTED W/ABD PAIN.SIGMOID VOLVULUS.HX:MET NEUROENDOCRINE CA.     Action/Plan:   FROM HOME.   Anticipated DC Date:  08/23/2014   Anticipated DC Plan:  [place]  CM consult      Choice offered to / List presented to:             Status of service:  Completed, signed off Medicare Important Message given?   (If response is "NO", the following Medicare IM given date fields will be blank) Date Medicare IM given:   Medicare IM given by:   Date Additional Medicare IM given:   Additional Medicare IM given by:    Discharge Disposition:  HOME/SELF CARE  Per UR Regulation:  Reviewed for med. necessity/level of care/duration of stay  If discussed at Long Length of Stay Meetings, dates discussed:    Comments:  08/23/14 Myrta Mercer RN,BSN NCM [phone] D/C HOME NO ORDERS OR ONEEDS.  08/20/14 Alexea Blase RN,BSN NCM [phone] NO ANTICIPATED D/C NEEDS.

## 2014-08-20 NOTE — Op Note (Signed)
[facility] [place], [address]   COLONOSCOPY PROCEDURE REPORT  PATIENT: Bryam, Taborda  MR#: [id number] BIRTHDATE: [birth year] , 53  yrs. old GENDER: Male ENDOSCOPIST: Ronald Lobo, MD REFERRED BY:   Dr. Marin Olp, Dr. Paulita Fujita PROCEDURE DATE:  08/20/2014 PROCEDURE:     colonoscopy (limited) ASA CLASS:    3 INDICATIONS:  abdominal pain and colonic distention with questionable strictured area in the mid descending colon MEDICATIONS:    MAC per anesthesia department  DESCRIPTION OF PROCEDURE: the nature, purpose, and risks of the procedure had been discussed with the patient and his wife. He provided written consent, was brought from his hospital room to the [facility] long endoscopy unit, and time out was performed.  Digital exam of the prostate area was unremarkable area there was no obvious perianal disease.  The Pentax adult video colonoscope was advanced to 110 cm, into what I believe is the mid transverse colon, even allowing for the fact that this patient has radiographic evidence of colonic redundancy. At this time, there was transillumination in the periumbilical area and the endoscopy nurse felt the scope sort of crossing the midline. On pullback after taking out loops, we were at about 70 cm. Pullback was then performed.  The purpose of this exam was to try to determine if there was either an intrinsic, anatomic, or extrinsic compression in the region of the mid descending colon, as had been observed on yesterday's CT scan. Originally, there had been a question of a sigmoid volvulus although on further review, it was not felt that there was preferential dilatation of the sigmoid region nor any change in the baseline anatomic distribution of the redundant colonic loops within his abdominal cavity. Thus, a volvulus was not felt to be present. However, he has had a previous Whipple procedure, raising the question of possible intra-abdominal  adhesions. Stool had been noted both above and below the area of narrowing or decompression on yesterday's CT.  On today's exam, there was some stool throughout the colon. The rectosigmoid area was relatively devoid of stool, status post 2 Fleet enemas yesterday. At no point during advancement did I encounter what felt like a stricture or an area of torsion or volvulus, or of significant extrinsic compression. No intraluminal abnormalities were noted anywhere on this exam, with the proviso that a large amount of stool could certainly have obscured small or focal abnormalities. Nonetheless, I feel confident that no constricting or large mass lesions were present. No diffuse abnormalities such as colitis or extensive diverticulosis were noted, either.  No biopsies were obtained. Retroflexion was not performed in the rectum.  Both at the beginning of the procedure, and at the end, , the abdomen was nondistended, soft, and nontender. However, following the procedure, there was less tympany compared to at the start of the procedure.     COMPLICATIONS: None  ENDOSCOPIC IMPRESSION:  1. No gross intraluminal abnormalities noted up to 110 cm, probably the mid transverse colon 2. No obvious intrinsic or extrinsic compression, torsion, or stricturing to correlate with yesterday's CT finding 3. No obvious source of patient's abdominal "spasms" or severe abdominal pain that had been occurring intermittently over the past couple of weeks  RECOMMENDATIONS:  1. Okay for clear liquid diet 2. Followup abdominal film later today and again tomorrow 3. trial of gentle laxation with MiraLAX    _______________________________ eSignedRonald Lobo, MD 08/20/2014 8:15 AM     PATIENT NAME:  Ernst Breach MR#: [id number]

## 2014-08-20 NOTE — Progress Notes (Signed)
Patient IV pulled out, unable to obtain new IV site. IV team paged, will come to floor to attempt.

## 2014-08-20 NOTE — Progress Notes (Signed)
[facility] PROGRESS NOTE  Byran A Loreen Freud. NFA:[id number] DOB: 28-Apr-1961 DOA: 08/19/2014 PCP: Volanda Napoleon, MD  Assessment/Plan: 1. ? Abdominal wall muscle spasm- colonoscopy done today of the midpoint of transverse colon was unremarkable, abdominal x-ray has been ordered for tomorrow morning , it  appears patient has spasm starting in the lower abdomen, at the site of suprapubic catheter, which then radiates to back and involves the neck muscles. Patient is on multiple muscle relaxants at home, will restart baclofen 20 mg 5 times a day, will also start Valium 5 mg IV q. 8 hours, and Zanaflex 4 mg every 6 hours when necessary. 2. Hypertension- patient has labile hypertension, with blood pressure fluctuations. Urine for metanephrines has been obtained to rule out pheochromocytoma. Follow the results. 3. Diabetes mellitus- continue Lantus 18 units subcutaneous daily along with sliding scale insulin with NovoLog. Blood glucose is well controlled. 4. Recent UTI- patient was diagnosed with UTI 5 days prior to edition which a urologist grew Proteus mirabilis and Escherichia coli. Patient is n.p.o. and has been started on IV [place]. 5. 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 The pain of the spasm start from the mid abdominal and moved to the back and upper neck. In the ED  patient had a CT scan of the abdomen which was concerning for sigmoid volvulus causing partial obstruction. GI was consulted, and patient underwent colonoscopy up to the midpoint of the transverse colon which did not show any significant abnormality.   On further questioning, patient says that he notices visible movement/fluttering in the abdomen when he gets spasm, which involves his back and neck. Patient has history of muscle spasms he has chronic indwelling Foley catheter in place.  Objective: Filed Vitals:   08/20/14 1345  BP: 165/100  Pulse: 110  Temp: 98.4 F (36.9 C)  Resp: 14    Intake/Output Summary (Last 24 hours) at 08/20/14 1451 Last data filed at 08/20/14 1121  Gross per 24 hour  Intake   2815 ml  Output    725 ml  Net   2090 ml   Filed Weights   08/19/14 1725 08/20/14 0416 08/20/14 0659  Weight: 69.7 kg (153 lb 10.6 oz) 69.4 kg (153 lb) 69.4 kg (153 lb)    Exam:  Physical Exam: Head: Normocephalic, atraumatic.  Lungs: Normal respiratory effort. B/L Clear to auscultation, no crackles or wheezes.  Heart: Regular RR. S1 and S2 normal  Abdomen: BS normoactive. Soft, Nondistended, non-tender, suprapubic catheter in place Extremities: No pretibial edema, no erythema   Data Reviewed: Basic Metabolic Panel:  Recent Labs Lab 08/19/14 1150 08/19/14 2102 08/20/14 0412  NA 133*  --  144  K 5.0  --  3.7  CL 90*  --  102  CO2 27  --  24  GLUCOSE 347*  --  129*  BUN 24*  --  25*  CREATININE 1.06  --  1.06  CALCIUM 9.6  --  8.8  MG  --  2.3  --  Liver Function Tests:  Recent Labs Lab 08/19/14 1150 08/20/14 0412  AST 23 18  ALT 17 12  ALKPHOS 151* 118*  BILITOT 0.5 0.3  PROT 8.4* 7.4  ALBUMIN 3.3* 2.9*    Recent Labs Lab 08/19/14 1150  LIPASE 6*   No results found for this basename: AMMONIA,  in the last 168 hours CBC:  Recent Labs Lab 08/19/14 1150 08/20/14 0412  WBC 9.9 9.3  NEUTROABS 7.4 6.4  HGB 15.0 13.5  HCT 45.3 40.9  MCV 80.9  80.2  PLT 235 228   Cardiac Enzymes: No results found for this basename: CKTOTAL, CKMB, CKMBINDEX, TROPONINI,  in the last 168 hours BNP (last 3 results) No results found for this basename: PROBNP,  in the last 8760 hours CBG:  Recent Labs Lab 08/19/14 2341 08/20/14 0413 08/20/14 0713 08/20/14 0830 08/20/14 1157  GLUCAP 125* 122* 117* 137* 156*    No results found for this or any previous visit (from the past 240 hour(s)).   Studies: Dg Abd 1 View  08/20/2014   CLINICAL DATA:  Followup colonic distention  EXAM: ABDOMEN - 1 VIEW  COMPARISON:  08/19/2014  FINDINGS: Scattered large and small bowel gas is noted. The degree of colonic distention has improved significantly in the interval from the prior exam. Persistent fecal material is noted within the right colon. A suprapubic catheter is noted in place. No acute abnormality is seen.  IMPRESSION: Improvement in the degree of colonic distention from the prior exam. No acute abnormality is noted.   Electronically Signed   By: Inez Catalina M.D.   On: 08/20/2014 11:47   Ct Abdomen Pelvis W Contrast  08/20/2014   ADDENDUM REPORT: 08/20/2014 11:05  ADDENDUM: The note that under the impression, it should state that the multiple enhancing solid and cystic renal masses which likely represent multiple renal cell carcinomas are NOT significantly changed.   Electronically Signed   By: Marin Olp M.D.   On: 08/20/2014 11:05   08/20/2014   CLINICAL DATA:  Severe abdominal pain and back spasms. No bowel movement in 4 days. History of metastatic neuroendocrine cancer 2009 post chemotherapy and Whipple's procedure. Von Hippel-Lindau syndrome.  EXAM: CT ABDOMEN AND PELVIS WITH CONTRAST  TECHNIQUE: Multidetector CT imaging of the abdomen and pelvis was performed using the standard protocol following bolus administration of intravenous contrast.  CONTRAST:  169mL OMNIPAQUE IOHEXOL 300 MG/ML  SOLN  COMPARISON:  07/09/2014, 04/11/2014 and 06/21/2012  FINDINGS:  The lung bases demonstrate a 4 mm nodule over the right lower lobe without significant change from 04/11/2014.  Abdominal images demonstrate diffuse hepatic steatosis. There are several hypervascular enhancing liver masses with the largest more heterogeneous area over the left lobe which appears slightly more prominent measuring 2.9 x 6.9 cm. There is evidence of patient's prior Whipple procedure without significant residual pancreatic tissue. There has been a prior cholecystectomy. Minimal pneumobilia over the left lobe unchanged. The spleen and adrenal glands are within normal. Stomach is somewhat distended. There is calcified plaque over the abdominal aorta. Numerous collateral arterial vessels over the anterior and left mid abdomen.  There are no dilated small bowel loops. There is moderate air and fecal retention over the ascending and transverse colon extending towards the splenic flexure and proximal descending colon. There is moderate redundancy of the sigmoid and descending colon. There is an abrupt caliber change of the descending colon in the mid abdomen just left of midline with a whirling of the mesentery across the midline  with air and stool seen within the colon distal to this point in the distal descending and rectosigmoid colon. This likely represents a colonic volvulus which may be intermittent. No evidence of free peritoneal air. No free fluid.  Kidneys are normal size with numerous simple and hyperdense cysts. There are several enhancing solid and cystic renal masses bilaterally left worse than right with the largest measuring 3.1 x 3.7 cm over the mid pole of the left kidney likely multiple renal cell carcinomas without significant change. Several left renal stones unchanged. Multiple metallic densities over the right kidney likely postsurgical. There is no hydronephrosis. Ureters are within normal.  Pelvic images demonstrate a suprapubic catheter in adequate position. There is surface retro  bladder wall thickening. Remaining pelvic structures are unchanged. There are multiple areas of enhancement within the spinal canal unchanged. Remainder the exam is unchanged.  IMPRESSION: Redundancy of the sigmoid and descending colon with findings suggesting volvulus over the mid descending colon just left of midline in the mid abdomen which may be intermittent as air and stool are seen distal to this abrupt region of narrowing.  Changes compatible patient's prior Whipple procedure. Multiple hypervascular lesions within the liver with slight progression as described. 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  4 mm right lower lobe nodule unchanged from May 2015.  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  Other chronic axillary findings unchanged.  Critical Value/emergent results were called by telephone at the time of interpretation on 08/19/2014 at 2:09 pm to Dr. Lacretia Leigh , who verbally acknowledged these results.  Electronically Signed: By: Marin Olp M.D. On: 08/19/2014 14:09   Dg Abd Acute W/chest  08/19/2014   CLINICAL DATA:  Abdominal pain; history of Whipple procedure 2 years ago and history of previous right nephrectomy  EXAM: ACUTE ABDOMEN SERIES (ABDOMEN 2 VIEW & CHEST 1 VIEW)  COMPARISON:  CT scan of the abdomen and pelvis of July 09, 2014  FINDINGS: The left lung is clear. On the right the aerated portion of the lung is clear. The hemidiaphragm is elevated. The heart is top-normal in size. The pulmonary vascularity is not engorged. Within the abdomen there are loops of moderately  distended gas-filled small and large bowel. No definite rectal gas is demonstrated. No free extraluminal gas collections are demonstrated. There are numerous surgical clips within the abdomen as well as within the pelvis.  IMPRESSION: Generalized ileus versus distal colonic obstructive pattern. There is no evidence of perforation. No acute cardiopulmonary abnormality is demonstrated here   Electronically Signed   By: David  Martinique   On: 08/19/2014 09:45    Scheduled Meds: . baclofen  20-40 mg Oral 5 X Daily  . cefTAZidime (FORTAZ)  IV  1 g Intravenous Q8H  . diazepam  5 mg Intravenous 3 times per day  . enoxaparin (LOVENOX) injection  40 mg Subcutaneous Q24H  . insulin aspart  0-9 Units Subcutaneous Q4H  . insulin glargine  18 Units Subcutaneous Daily  . midodrine  10 mg Oral BID WC  . polyethylene glycol  17 g Oral TID  . sodium chloride  1,000 mL Intravenous Once  .  sodium phosphate  1 enema Rectal Once   Continuous Infusions: . 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

## 2014-08-20 NOTE — Progress Notes (Signed)
Colonoscopy to the midportion of the colon was unremarkable.  Please see dictated report for findings and recommendations.  I discussed the findings with the patient and his wife.  Cleotis Nipper, M.D. [phone]

## 2014-08-20 NOTE — Progress Notes (Signed)
Patient to endo with Estelle June, RN for colonoscopy this AM, report given to Terri about bowel prep with enema and consistency of bowel movements. Patient is awake and alert at time of departure, denies pain.

## 2014-08-20 NOTE — Progress Notes (Signed)
Second enema given per order, small amount of brown colored fluid returned from rectum, small particles noted. No formed stool returned at this time.

## 2014-08-20 NOTE — Progress Notes (Signed)
OT Cancellation Note  Patient Details Name: [name]. MRN: [id number] DOB: 1961/05/08   Cancelled Treatment:    Reason Eval/Treat Not Completed: OT screened, no needs identified, will sign off.  Pt's wife is an OT; spoke to her, and they have no OT needs.    [name] 08/20/2014, 12:57 PM Lesle Chris, OTR/L [phone] 08/20/2014

## 2014-08-20 NOTE — Progress Notes (Signed)
Inpatient Diabetes Program Recommendations  AACE/ADA: New Consensus Statement on Inpatient Glycemic Control (2013)  Target Ranges:  Prepandial:   less than 140 mg/dL      Peak postprandial:   less than 180 mg/dL (1-2 hours)      Critically ill patients:  140 - 180 mg/dL   Reason for Visit: Diabetes Consult  Diabetes history: DM (metastatic neuroendocrine carcinoma)  Outpatient Diabetes medications: Lantus 25 units QAM, Novolog 1:10 CHO ratio and s/s   Results for JOSEL, KEO (MRN [id number]) as of 08/20/2014 11:12  Ref. Range 08/19/2014 22:48 08/19/2014 23:41 08/20/2014 04:13 08/20/2014 07:13 08/20/2014 08:30  Glucose-Capillary Latest Range: 70-99 mg/dL 160 (H) 125 (H) 122 (H) 117 (H) 137 (H)  Results for ALVA, BROXSON (MRN [id number]) as of 08/20/2014 11:12  Ref. Range 08/20/2014 04:12  Sodium Latest Range: 137-147 mEq/L 144  Potassium Latest Range: 3.7-5.3 mEq/L 3.7  Chloride Latest Range: 96-112 mEq/L 102  CO2 Latest Range: 19-32 mEq/L 24  BUN Latest Range: 6-23 mg/dL 25 (H)  Creatinine Latest Range: 0.50-1.35 mg/dL 1.06  Calcium Latest Range: 8.4-10.5 mg/dL 8.8  GFR calc non Af Amer Latest Range: >90 mL/min 80 (L)  GFR calc Af Amer Latest Range: >90 mL/min >90  Glucose Latest Range: 70-99 mg/dL 129 (H)  Anion gap Latest Range: 5-15  18 (H)   Endoscopy this am. To start CL diet. Received Lantus 18 units this am.  Recommendations: Add Novolog 3 units tidwc for meal coverage insulin. (CL diet has approx 60 g CHO) When diet advanced, will likely need to increase meal coverage. (1:10 ratio) Add HS correction.  Discussed with RN. Will continue to follow. Thank you. Lorenda Peck, RD, LDN, CDE Inpatient Diabetes Coordinator [phone]

## 2014-08-20 NOTE — Anesthesia Postprocedure Evaluation (Signed)
Anesthesia Post Note  Patient: [name].  Procedure(s) Performed: Procedure(s) (LRB): COLONOSCOPY WITH PROPOFOL (N/A)  Anesthesia type: MAC  Patient location: PACU  Post pain: Pain level controlled  Post assessment: Post-op Vital signs reviewed  Last Vitals: BP 165/100  Pulse 110  Temp(Src) 36.9 C (Oral)  Resp 14  Ht 6\' 1"  (1.854 m)  Wt 153 lb (69.4 kg)  BMI 20.19 kg/m2  SpO2 100%  Post vital signs: Reviewed  Level of consciousness: awake  Complications: No apparent anesthesia complications

## 2014-08-20 NOTE — Progress Notes (Signed)
Hypoglycemic Event  CBG: 69   Treatment: 4oz regular soda, meal tray came too  Symptoms: shaking, pt called out to desk and said that it was low  Follow-up CBG: Time: CBG Result  Possible Reasons for Event:   Comments/MD notified: bedside RN/NT to follow up    Kaylyn Layer  Remember to initiate Hypoglycemia Order Set & complete

## 2014-08-21 ENCOUNTER — Encounter (HOSPITAL_COMMUNITY): Payer: Self-pay | Admitting: Gastroenterology

## 2014-08-21 ENCOUNTER — Inpatient Hospital Stay (HOSPITAL_COMMUNITY): Payer: 59

## 2014-08-21 DIAGNOSIS — N319 Neuromuscular dysfunction of bladder, unspecified: Secondary | ICD-10-CM

## 2014-08-21 DIAGNOSIS — N390 Urinary tract infection, site not specified: Secondary | ICD-10-CM

## 2014-08-21 DIAGNOSIS — E891 Postprocedural hypoinsulinemia: Secondary | ICD-10-CM

## 2014-08-21 DIAGNOSIS — E139 Other specified diabetes mellitus without complications: Secondary | ICD-10-CM

## 2014-08-21 DIAGNOSIS — Z90410 Acquired total absence of pancreas: Secondary | ICD-10-CM

## 2014-08-21 DIAGNOSIS — E860 Dehydration: Secondary | ICD-10-CM

## 2014-08-21 LAB — GLUCOSE, CAPILLARY
GLUCOSE-CAPILLARY: 107 mg/dL — AB (ref 70–99)
GLUCOSE-CAPILLARY: 136 mg/dL — AB (ref 70–99)
Glucose-Capillary: 115 mg/dL — ABNORMAL HIGH (ref 70–99)
Glucose-Capillary: 155 mg/dL — ABNORMAL HIGH (ref 70–99)
Glucose-Capillary: 165 mg/dL — ABNORMAL HIGH (ref 70–99)
Glucose-Capillary: 176 mg/dL — ABNORMAL HIGH (ref 70–99)
Glucose-Capillary: 78 mg/dL (ref 70–99)

## 2014-08-21 LAB — CBC
HEMATOCRIT: 41.9 % (ref 39.0–52.0)
HEMOGLOBIN: 13.5 g/dL (ref 13.0–17.0)
MCH: 26.3 pg (ref 26.0–34.0)
MCHC: 32.2 g/dL (ref 30.0–36.0)
MCV: 81.7 fL (ref 78.0–100.0)
Platelets: 217 10*3/uL (ref 150–400)
RBC: 5.13 MIL/uL (ref 4.22–5.81)
RDW: 16.1 % — AB (ref 11.5–15.5)
WBC: 8.8 10*3/uL (ref 4.0–10.5)

## 2014-08-21 LAB — URINE CULTURE: Colony Count: 60000

## 2014-08-21 LAB — BASIC METABOLIC PANEL
Anion gap: 14 (ref 5–15)
BUN: 18 mg/dL (ref 6–23)
CHLORIDE: 104 meq/L (ref 96–112)
CO2: 23 mEq/L (ref 19–32)
Calcium: 9 mg/dL (ref 8.4–10.5)
Creatinine, Ser: 0.81 mg/dL (ref 0.50–1.35)
Glucose, Bld: 116 mg/dL — ABNORMAL HIGH (ref 70–99)
POTASSIUM: 3.6 meq/L — AB (ref 3.7–5.3)
SODIUM: 141 meq/L (ref 137–147)

## 2014-08-21 MED ORDER — SENNA 8.6 MG PO TABS
2.0000 | ORAL_TABLET | Freq: Every day | ORAL | Status: DC
  Administered 2014-08-21 – 2014-08-22 (×2): 17.2 mg via ORAL
  Filled 2014-08-21 (×2): qty 2

## 2014-08-21 MED ORDER — POTASSIUM CHLORIDE CRYS ER 20 MEQ PO TBCR
40.0000 meq | EXTENDED_RELEASE_TABLET | Freq: Three times a day (TID) | ORAL | Status: DC
Start: 2014-08-21 — End: 2014-08-23
  Administered 2014-08-21 – 2014-08-23 (×6): 40 meq via ORAL
  Filled 2014-08-21 (×8): qty 2

## 2014-08-21 MED ORDER — POTASSIUM CHLORIDE CRYS ER 20 MEQ PO TBCR
40.0000 meq | EXTENDED_RELEASE_TABLET | Freq: Once | ORAL | Status: DC
  Filled 2014-08-21: qty 2

## 2014-08-21 MED ORDER — POLYETHYLENE GLYCOL 3350 17 G PO PACK
17.0000 g | PACK | Freq: Two times a day (BID) | ORAL | Status: DC
  Administered 2014-08-21 – 2014-08-23 (×4): 17 g via ORAL
  Filled 2014-08-21 (×5): qty 1

## 2014-08-21 MED ORDER — FLUCONAZOLE 100 MG PO TABS
100.0000 mg | ORAL_TABLET | Freq: Every day | ORAL | Status: DC
  Administered 2014-08-21 – 2014-08-23 (×3): 100 mg via ORAL
  Filled 2014-08-21 (×3): qty 1

## 2014-08-21 NOTE — Progress Notes (Signed)
K.Kirby, NP returned paged and requested I tell the pt and his wife that she would not add Baclofen back to medications, to please talk to Dr. Hartford Poli in the morning. Pt and wife [name]) imformed. Will continue to monitor.

## 2014-08-21 NOTE — Progress Notes (Signed)
[facility] PROGRESS NOTE  Yvan A Loreen Freud. JOI:[id number] DOB: 30-Mar-1961 DOA: 08/19/2014 PCP: Volanda Napoleon, MD  Assessment/Plan:  Abdominal pain  -Initial CT scan of abdomen/pelvis showed possible volvulus. -GI evaluated the patient, partial colonoscopy done yesterday to the mid point of transverse colon. -Per GI report there is no volvulus. -Repeat x-ray showed distended colon loops consistent with partial ileus. -Continue oral laxatives and enemas as needed.  Hypertension -Patient has labile hypertension, with blood pressure fluctuations.   Diabetes mellitus -Continue Lantus 18 units subcutaneous daily along with sliding scale insulin with NovoLog.  -Blood glucose is well controlled.  Recent UTI -Has suprapubic catheter, urinalysis was consistent with UTI, showed some [place] -At his Urologist urologist office, urine grew Proteus mirabilis and Escherichia coli. 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 The pain of the spasm start from the mid abdominal and moved to the back and upper neck. In the ED patient had a CT scan of the abdomen which was concerning for sigmoid volvulus causing partial obstruction. GI was consulted, and patient underwent colonoscopy up to the midpoint of the transverse colon which did not show any significant  abnormality.   On further questioning, patient says that he notices visible movement/fluttering in the abdomen when he gets spasm, which involves his back and neck. Patient has history of muscle spasms he has chronic indwelling Foley catheter in place.  Objective: Filed Vitals:   08/21/14 0416  BP: 166/95  Pulse: 78  Temp: 98 F (36.7 C)  Resp: 18    Intake/Output Summary (Last 24 hours) at 08/21/14 1328 Last data filed at 08/21/14 1012  Gross per 24 hour  Intake 3195.42 ml  Output   2400 ml  Net 795.42 ml   Filed Weights   08/20/14 0416 08/20/14 0659 08/21/14 0416  Weight: 69.4 kg (153 lb) 69.4 kg (153 lb) 68.8 kg (151 lb 10.8 oz)    Exam:  Physical Exam: Head: Normocephalic, atraumatic.  Lungs: Normal respiratory effort. B/L Clear to auscultation, no crackles or wheezes.  Heart: Regular RR. S1 and S2 normal  Abdomen: BS normoactive. Soft, Nondistended, non-tender, suprapubic catheter in place Extremities: No pretibial edema, no erythema   Data Reviewed: Basic Metabolic Panel:  Recent Labs Lab 08/19/14 1150 08/19/14 2102 08/20/14 0412 08/21/14 0426  NA 133*  --  144 141  K 5.0  --  3.7 3.6*  CL 90*  --  102 104  CO2 27  --  24 23  GLUCOSE 347*  --  129* 116*  BUN 24*  --  25* 18  CREATININE 1.06  --  1.06 0.81  CALCIUM 9.6  --  8.8 9.0  MG  --  2.3  --   --    Liver Function Tests:  Recent Labs Lab 08/19/14 1150 08/20/14 0412  AST 23 18  ALT 17 12  ALKPHOS 151* 118*  BILITOT 0.5 0.3  PROT 8.4* 7.4  ALBUMIN 3.3* 2.9*    Recent Labs Lab 08/19/14  1150  LIPASE 6*   No results found for this basename: AMMONIA,  in the last 168 hours CBC:  Recent Labs Lab 08/19/14 1150 08/20/14 0412 08/21/14 0426  WBC 9.9 9.3 8.8  NEUTROABS 7.4 6.4  --   HGB 15.0 13.5 13.5  HCT 45.3 40.9 41.9  MCV 80.9 80.2 81.7  PLT 235 228 217   Cardiac Enzymes: No results found for this basename: CKTOTAL, CKMB, CKMBINDEX, TROPONINI,  in the last 168 hours BNP  (last 3 results) No results found for this basename: PROBNP,  in the last 8760 hours CBG:  Recent Labs Lab 08/20/14 2018 08/20/14 2352 08/21/14 0416 08/21/14 0746 08/21/14 1147  GLUCAP 206* 165* 115* 176* 155*    Recent Results (from the past 240 hour(s))  URINE CULTURE     Status: None   Collection Time    08/19/14  6:12 PM      Result Value Ref Range Status   Specimen Description URINE, SUPRAPUBIC   Final   Special Requests NONE   Final   Culture  Setup Time     Final   Value: 08/20/2014 02:04     Performed at [place]     Final   Value: 60,000 COLONIES/ML     Performed at [facility]   Culture     Final   Value: YEAST     Performed at [facility]   Report Status 08/21/2014 FINAL   Final     Studies: Dg Abd 1 View  08/21/2014   CLINICAL DATA:  Abdominal pain, colonic distension  EXAM: ABDOMEN - 1 VIEW  COMPARISON:  08/20/2014  FINDINGS: Persistent mild gaseous distended colonic loops in mid abdomen suspicious for residual ileus. Surgical clips are noted in right abdomen. Pelvic phleboliths are noted.  IMPRESSION: Persistent mild gaseous distended colonic loops in mid abdomen suspicious for residual ileus.   Electronically Signed   By: Lahoma Crocker M.D.   On: 08/21/2014 09:51   Dg Abd 1 View  08/20/2014   CLINICAL DATA:  Followup colonic distention  EXAM: ABDOMEN - 1 VIEW  COMPARISON:  08/19/2014  FINDINGS: Scattered large and small bowel gas is noted. The degree of colonic distention has improved significantly in the interval from the prior exam. Persistent fecal material is noted within the right colon. A suprapubic catheter is noted in place. No acute abnormality is seen.  IMPRESSION: Improvement in the degree of colonic distention from the prior exam. No acute abnormality is noted.   Electronically Signed   By: Inez Catalina M.D.   On: 08/20/2014 11:47    Scheduled Meds: . baclofen  20-40 mg Oral 5 X Daily  . cefTAZidime  (FORTAZ)  IV  1 g Intravenous Q8H  . diazepam  5 mg Intravenous 3 times per day  . enoxaparin (LOVENOX) injection  40 mg Subcutaneous Q24H  . insulin aspart  0-9 Units Subcutaneous Q4H  . insulin glargine  18 Units Subcutaneous Daily  . midodrine  10 mg Oral BID WC  . polyethylene glycol  17 g Oral TID  . sodium chloride  1,000 mL Intravenous Once  . sodium phosphate  1 enema Rectal Once   Continuous Infusions: . 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

## 2014-08-21 NOTE — Progress Notes (Signed)
Pt wife requesting Baclofen 20-40 mg PO 5 times a day to be reordered. Tylene Fantasia, NP text paged. Awaiting return page. Will continue to monitor.

## 2014-08-21 NOTE — Progress Notes (Signed)
Patient states he is doing "so-so."  No pain medication today, but patient seems a little bit somnolent and distant in affect, although not frankly delirious.  Abdominal film from today, and also yesterday, shows moderate proximal colonic stool but a significant reduction in gas compared to his admission films.  On exam, the abdomen is soft, nontender, and completely nontympanitic.  The patient had a large loose bowel movement a short while ago, his only bowel movement today. He is on MiraLAX.  Impression: Improved abdominal pain, etiology not clear, suspect intestinal ileus.  Plan: Decrease MiraLAX so as to not cause "overshoot" diarrhea. I suspect the patient should be on maintenance MiraLAX daily as an outpatient, although he might have to decrease the dose to one half of the standard dose daily to avoid diarrhea.  I will sign off at this time. Please call me if I can be of further assistance in this patient's care.  Cleotis Nipper, M.D. [phone]

## 2014-08-21 NOTE — Progress Notes (Signed)
As an addendum to my previous note, I feel that the patient can be started on a regular diet at this time.  I have written an order for heart healthy diet.  Cleotis Nipper, M.D. [phone]

## 2014-08-22 ENCOUNTER — Inpatient Hospital Stay (HOSPITAL_COMMUNITY): Payer: 59

## 2014-08-22 ENCOUNTER — Ambulatory Visit (HOSPITAL_COMMUNITY): Payer: 59

## 2014-08-22 DIAGNOSIS — I959 Hypotension, unspecified: Secondary | ICD-10-CM

## 2014-08-22 DIAGNOSIS — D509 Iron deficiency anemia, unspecified: Secondary | ICD-10-CM

## 2014-08-22 LAB — CATECHOLAMINES, FRACTIONATED, PLASMA
Dopamine: 122 pg/mL
EPINEPHRINE: 97 pg/mL
Norepinephrine: 2466 pg/mL
Total Catecholamines (Nor+Epi): 2563 pg/mL

## 2014-08-22 LAB — BASIC METABOLIC PANEL
ANION GAP: 11 (ref 5–15)
BUN: 11 mg/dL (ref 6–23)
CO2: 27 meq/L (ref 19–32)
CREATININE: 0.82 mg/dL (ref 0.50–1.35)
Calcium: 8.8 mg/dL (ref 8.4–10.5)
Chloride: 106 mEq/L (ref 96–112)
GFR calc non Af Amer: >90 mL/min (ref >90)
Glucose, Bld: 132 mg/dL — ABNORMAL HIGH (ref 70–99)
POTASSIUM: 4.2 meq/L (ref 3.7–5.3)
Sodium: 144 mEq/L (ref 137–147)

## 2014-08-22 LAB — GLUCOSE, CAPILLARY
GLUCOSE-CAPILLARY: 137 mg/dL — AB (ref 70–99)
GLUCOSE-CAPILLARY: 107 mg/dL — AB (ref 70–99)
Glucose-Capillary: 119 mg/dL — ABNORMAL HIGH (ref 70–99)
Glucose-Capillary: 172 mg/dL — ABNORMAL HIGH (ref 70–99)
Glucose-Capillary: 184 mg/dL — ABNORMAL HIGH (ref 70–99)

## 2014-08-22 MED ORDER — MIDODRINE HCL 5 MG PO TABS
10.0000 mg | ORAL_TABLET | Freq: Two times a day (BID) | ORAL | Status: DC | PRN
  Filled 2014-08-22: qty 2

## 2014-08-22 MED ORDER — BACLOFEN 20 MG PO TABS
20.0000 mg | ORAL_TABLET | Freq: Every day | ORAL | Status: DC
  Administered 2014-08-22 – 2014-08-23 (×6): 20 mg via ORAL
  Filled 2014-08-22 (×11): qty 1

## 2014-08-22 MED ORDER — GADOBENATE DIMEGLUMINE 529 MG/ML IV SOLN
14.0000 mL | Freq: Once | INTRAVENOUS | Status: AC | PRN
  Administered 2014-08-22: 14 mL via INTRAVENOUS

## 2014-08-22 MED ORDER — LORAZEPAM 2 MG/ML IJ SOLN
1.0000 mg | Freq: Once | INTRAMUSCULAR | Status: AC | PRN
  Administered 2014-08-22: 2 mg via INTRAVENOUS
  Filled 2014-08-22: qty 1

## 2014-08-22 MED ORDER — LORAZEPAM 2 MG/ML IJ SOLN
2.0000 mg | Freq: Once | INTRAMUSCULAR | Status: AC
  Administered 2014-08-22: 2 mg via INTRAVENOUS
  Filled 2014-08-22: qty 1

## 2014-08-22 MED ORDER — ENSURE COMPLETE PO LIQD: 237.0000 mL | Freq: Two times a day (BID) | ORAL | Status: DC | PRN

## 2014-08-22 MED ORDER — HYDRALAZINE HCL 20 MG/ML IJ SOLN
5.0000 mg | Freq: Four times a day (QID) | INTRAMUSCULAR | Status: DC | PRN
  Administered 2014-08-22: 5 mg via INTRAVENOUS
  Filled 2014-08-22: qty 1

## 2014-08-22 MED ORDER — LORAZEPAM 2 MG/ML IJ SOLN
2.0000 mg | Freq: Once | INTRAMUSCULAR | Status: AC | PRN
  Administered 2014-08-22: 2 mg via INTRAVENOUS
  Filled 2014-08-22: qty 1

## 2014-08-22 NOTE — Progress Notes (Addendum)
INITIAL NUTRITION ASSESSMENT  DOCUMENTATION CODES Per approved criteria  -Not Applicable   INTERVENTION: -Modified diet to Carb Mod/Heart healthy d/t hx of DM2 -Monitor PO intake and implement Ensure supplement PRN -RD to continue to follow  NUTRITION DIAGNOSIS: Increased nutrient needs related to increased demand for nutrients (protein/kcal) as evidenced by chronic disease-neuroendocrine carcinoma.   Goal: Pt to meet >/= 90% of their estimated nutrition needs    Monitor:  Total protein/energy intake, labs, weights, GI profile  Reason for Assessment: Low Braden Score  53 y.o. 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 GI noted pt with partial ileus. Recommended laxatives and enema. Started on clear liquid diet on 9/15 -Had BM on 9/16. Advanced to heart healthy diet. Current PO intake 20%. CBGS elevated d/t hx of DM2 2/2 pancreatomy. Consider addition of Glucerna shakes if PO intake remains < 75%. Will modify Carb Mod diet modification to assist with CBG control   Height: Ht Readings from Last 1 Encounters:  08/20/14 6\' 1"  (1.854 m)    Weight: Wt Readings from Last 1 Encounters:  08/22/14 152 lb 8.9 oz (69.2 kg)    Ideal Body Weight: 184 lb  % Ideal Body Weight: 83%  Wt Readings from Last 10 Encounters:  08/22/14 152 lb 8.9 oz (69.2 kg)  08/22/14  152 lb 8.9 oz (69.2 kg)  08/22/14 152 lb 8.9 oz (69.2 kg)  05/14/14 162 lb 4.1 oz (73.6 kg)  02/04/14 150 lb (68.04 kg)  02/04/14 150 lb (68.04 kg)  04/11/13 150 lb (68.04 kg)  06/22/11 145 lb (65.772 kg)    Usual Body Weight: 150 lbs per previous medical records  % Usual Body Weight: 101%  BMI:  Body mass index is 20.13 kg/(m^2).  Estimated Nutritional Needs: Kcal: 2100-2300 Protein: 85-95 gram Fluid: >/=2100 ml/daily  Skin: healing blister on right heel  Diet Order: Cardiac  EDUCATION NEEDS: -No education needs identified at this time   Intake/Output Summary (Last 24 hours) at 08/22/14 1446 Last data filed at 08/22/14 0830  Gross per 24 hour  Intake   2510 ml  Output   3195 ml  Net   -685 ml    Last BM: 9/17   Labs:   Recent Labs Lab 08/19/14 2102 08/20/14 0412 08/21/14 0426 08/22/14 0415  NA  --  144 141 144  K  --  3.7 3.6* 4.2  CL  --  102 104 106  CO2  --  24 23 27   BUN  --  25* 18 11  CREATININE  --  1.06 0.81 0.82  CALCIUM  --  8.8 9.0 8.8  MG 2.3  --   --   --   GLUCOSE  --  129* 116* 132*    CBG (last 3)   Recent Labs  08/22/14 0316 08/22/14 0753 08/22/14 1159  GLUCAP 119* 137* 172*    Scheduled Meds: . baclofen  20 mg Oral 5 X  Daily  . cefTAZidime (FORTAZ)  IV  1 g Intravenous Q8H  . diazepam  5 mg Intravenous 3 times per day  . enoxaparin (LOVENOX) injection  40 mg Subcutaneous Q24H  . fluconazole  100 mg Oral Daily  . insulin aspart  0-9 Units Subcutaneous Q4H  . insulin glargine  18 Units Subcutaneous Daily  . polyethylene glycol  17 g Oral BID  . potassium chloride  40 mEq Oral TID  . senna  2 tablet Oral Daily    Continuous Infusions: . sodium chloride 75 mL/hr at 08/21/14 2036    Past Medical History  Diagnosis Date  . 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 Diabetes mellitus without complication   . 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 Tumor of optic nerve     right  -"stable"  . Headache(784.0)     neuropathic pain usually behind eyes related to blood pressure  . Transfusion history     '09 .   Marland Kitchen Iron deficiency anemia, unspecified 07/10/2014  . HTN (hypertension) 08/19/2014  . 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 Orthostatic hypotension 08/19/2014  . Anemia 08/19/2014    Past Surgical History  Procedure Laterality Date  . Back surgery      '91- T#-T5 "tumor resection"-benign  . Reconstruction breast w/ tram flap    . Muscle flap myocutaneous / fasciocutaneous of trunk Left     '01  . Craniotomy for tumor      medulla tumor "benign tumor"-salvage own bone for closure  . Peripherally inserted central catheter insertion      past hx.  . Enucleation Left     '05 with prosthesis  . Cervical spine surgery      '07- resection tumor C5-C7-"benign"  . Partial nephrectomy Right     '09- cancer  . Pancreas surgery      '09- with partial liver resection/ pancreas "[place]"  . 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 Insertion of suprapubic catheter N/A 02/04/2014    Procedure: INSERTION OF SUPRAPUBIC CATHETER;  Surgeon: Franchot Gallo, MD;  Location: [facility];  Service: Urology;  Laterality: N/A;  . Cystoscopy with litholapaxy N/A 02/04/2014    Procedure: CYSTOSCOPY WITH LITHOLAPAXY;  Surgeon: Franchot Gallo, MD;  Location: [facility];  Service: Urology;  Laterality: N/A;  . Colonoscopy with propofol N/A 08/20/2014    Procedure: COLONOSCOPY WITH PROPOFOL;  Surgeon: Cleotis Nipper, MD;  Location: [facility];  Service: Endoscopy;  Laterality: N/A;    Atlee Abide MS RD LDN Clinical Dietitian [phone]

## 2014-08-22 NOTE — Progress Notes (Signed)
Pt was given scheduled Midodrine at 0806. Following administration and during assessment wife calls and reports he should not get the midodrine and states "I only give it to him when he is getting up in the chair. It is for his orthostatic hypotension. It should not be a scheduled drug." She then tells me she is coming back to the hospital to get him up in the chair.  Pt reports change in headache. Writer sits patient up in bed.    Labetalol is ordered- wife refused it to be given  0830 BP is 183/97 in left arm.   I report the BP and wife's comments to Dr. Hartford Poli. Dr. Hartford Poli ordered for Hydralazine and changed Midodrine to PRN prior to getting out of bed and discontinued order for labetalol.   0900- wife is in the room and moving him from the bed to the wheelchair- she requested "we wait and see if sitting him up helps".  Dr. Hartford Poli and wife are discussing his condition  0907 BP 169/110 The nurse requested to give Hydralazine and wife accepts, hydralazine administered as ordered.   0911 BP 143/90.  Patient reports " feeling better and decrease in headache."  Will continue to monitor patient.

## 2014-08-22 NOTE — Progress Notes (Signed)
ANTIBIOTIC CONSULT NOTE - follow up  Pharmacy Consult for Ceftazidime Indication: UTI  Allergies  Allergen Reactions  . Ciprocin-Fluocin-Procin [Fluocinolone Acetonide] Rash    Pt states this happened with IV Cipro. ABLE TO TAKE PO CIPRO.  . Other Rash    IV-CIPRO    Patient Measurements: Height: 6\' 1"  (185.4 cm) Weight: 152 lb 8.9 oz (69.2 kg) IBW/kg (Calculated) : 79.9   Vital Signs: Temp: 98.5 F (36.9 C) (09/17 0507) Temp src: Oral (09/17 0507) BP: 136/87 mmHg (09/17 0507) Pulse Rate: 82 (09/17 0507) Intake/Output from previous day: 09/16 0701 - 09/17 0700 In: 2710 [P.O.:270; I.V.:2290; IV Piggyback:150] Out: 5020 [Urine:5020] Intake/Output from this shift:    Labs:  Recent Labs  08/19/14 1150 08/20/14 0412 08/21/14 0426 08/22/14 0415  WBC 9.9 9.3 8.8  --   HGB 15.0 13.5 13.5  --   PLT 235 228 217  --   CREATININE 1.06 1.06 0.81 0.82   Estimated Creatinine Clearance: 104.3 ml/min (by C-G formula based on Cr of 0.82). No results found for this basename: Letta Median, VANCORANDOM, [place], GENTPEAK, GENTRANDOM, TOBRATROUGH, TOBRAPEAK, TOBRARND, AMIKACINPEAK, AMIKACINTROU, AMIKACIN,  in the last 72 hours   Microbiology: Recent Results (from the past 720 hour(s))  URINE CULTURE     Status: None   Collection Time    08/19/14  6:12 PM      Result Value Ref Range Status   Specimen Description URINE, SUPRAPUBIC   Final   Special Requests NONE   Final   Culture  Setup Time     Final   Value: 08/20/2014 02:04     Performed at [place]     Final   Value: 60,000 COLONIES/ML     Performed at [facility]   Culture     Final   Value: YEAST     Performed at [facility]   Report Status 08/21/2014 FINAL   Final    Medical History: Past Medical History  Diagnosis Date  . 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 Diabetes mellitus without complication   . Neuroendocrine  carcinoma metastatic to liver   . Tumor of optic nerve     right -"stable"  . Headache(784.0)     neuropathic pain usually behind eyes related to blood pressure  . Transfusion history     '09 .   Marland Kitchen Iron deficiency anemia, unspecified 07/10/2014  . HTN (hypertension) 08/19/2014  . 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 Orthostatic hypotension 08/19/2014  . Anemia 08/19/2014     Assessment: 43 yoM with a complicated medical history including metastatic neuroendocrine carcinoma and Von Hiple-Laindau syndrome followed by Dr. Marin Olp, admitted with abdominal pain.  CT abdomen shows sigmoid volvulus.  GI consulted.  Pharmacy consulted to dose ceftazidime on 9/14 for recent UTI sensitive to ceftazidime per outpatient urology.  9/14 >> ceftazidime >> 9/16 >> Diflucan >>  Tmax: afebrile WBC: WNL Renal: SCr 1.06, CrCl~85 ml/min (CG)  9/14 urine culture: 60k yeast  Goal of Therapy:  Doses adjusted per renal function Eradication of infection  Plan:  1.  Continue Ceftazidime 1g IV q8h. 2.  Do not have access to outpatient urine culture as notes mention in Epic so unsure what PO antibiotics are options in this case.  Adrian Saran, PharmD, BCPS Pager [phone] 08/22/2014 8:24 AM

## 2014-08-22 NOTE — Progress Notes (Signed)
[facility] PROGRESS NOTE  [name]. 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 The pain of the spasm start from the mid abdominal and moved to the back and upper neck. In the ED patient had a CT scan of the abdomen which was concerning for sigmoid volvulus causing partial obstruction. GI was consulted, and patient underwent colonoscopy up to the midpoint of the transverse colon which did not show any significant abnormality.  On further questioning, patient says that he notices visible movement/fluttering in the abdomen when he gets spasm, which involves his back and neck. Patient has history of muscle spasms he has chronic indwelling Foley catheter in place.  Assessment/Plan:  Abdominal pain  -Initial CT scan of abdomen/pelvis showed possible volvulus. -GI evaluated the patient, partial colonoscopy done yesterday to the mid point of transverse colon. -Per GI report there is no volvulus. -Repeat x-ray showed distended colon loops consistent with partial ileus. -Continue oral laxatives and enemas as needed. -Diet advance to solids, patient is having multiple bowel movements, appears that ileus resolved.  Headache  -Complains about severe headache, had headache for the past 10 days but since last night it was more severe. 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 -CT scan of the head done showed vague findings, MRI brain/C-spine to be done.  Muscle spasm -Patient reported that he has recurrent back and upper extremity muscle spasms. -On multiple muscle relaxants  at home including baclofen and Valium and Zanaflex as needed.  Hypertension -Patient has labile hypertension, with blood pressure fluctuations.  -Urine metanephrines sent to rule out pheochromocytoma.  Diabetes mellitus -Continue Lantus 18 units subcutaneous daily along with sliding scale insulin with NovoLog.  -Expect his blood sugar to go up after started on diet.  Recent UTI -Has suprapubic catheter, urinalysis was consistent with UTI, showed some yeast Diflucan added -At his Urologist urologist office, urine grew Proteus mirabilis and Escherichia coli. Started on IV Fortaz  History of neuroendocrine metastatic carcinoma/von Hippel-Lindau syndrome -Continue monthly Sandostatin followup oncology as outpatient  Code Status: Full code Family Communication: Discussed with wife at the bedside Disposition Plan:  Home when stable   Consultants:  Gi  Procedures:  Colonoscopy  Antibiotics:  Fortaz    Objective: Filed Vitals:   08/22/14 1101  BP: 151/82  Pulse: 87  Temp: 98.4 F (36.9 C)  Resp: 18    Intake/Output Summary (Last 24 hours) at 08/22/14 1223 Last data filed at 08/22/14 0830  Gross per 24 hour  Intake   2630 ml  Output   4495 ml  Net  -1865 ml   Filed Weights   08/20/14 0659 08/21/14 0416 08/22/14 0507  Weight: 69.4 kg (153 lb) 68.8 kg (151 lb 10.8 oz) 69.2 kg (152 lb 8.9 oz)    Exam:  Physical Exam: Head: Normocephalic, atraumatic.  Lungs: Normal respiratory effort. B/L Clear to auscultation, no crackles or wheezes.  Heart: Regular RR. S1 and S2 normal  Abdomen: BS normoactive. Soft, Nondistended, non-tender, suprapubic catheter in place Extremities: No pretibial edema, no erythema   Data Reviewed: Basic Metabolic Panel:  Recent Labs Lab 08/19/14 1150 08/19/14 2102 08/20/14 5573 08/21/14 0426 08/22/14 0415  NA 133*  --  144 141 144  K 5.0  --  3.7 3.6* 4.2  CL 90*  --  102 104 106  CO2 27  --  24 23 27   GLUCOSE 347*  --  129*  116* 132*  BUN 24*  --  25* 18 11  CREATININE 1.06  --  1.06 0.81 0.82  CALCIUM 9.6  --  8.8 9.0 8.8  MG  --  2.3  --   --   --    Liver Function Tests:  Recent Labs Lab 08/19/14 1150 08/20/14 0412  AST 23 18  ALT 17 12  ALKPHOS 151* 118*  BILITOT 0.5 0.3  PROT 8.4* 7.4  ALBUMIN 3.3* 2.9*    Recent Labs Lab 08/19/14 1150  LIPASE 6*   No results found for this basename: AMMONIA,  in the last 168 hours CBC:  Recent Labs Lab 08/19/14 1150 08/20/14 0412 08/21/14 0426  WBC 9.9 9.3 8.8  NEUTROABS 7.4 6.4  --   HGB 15.0 13.5 13.5  HCT 45.3 40.9 41.9  MCV 80.9 80.2 81.7  PLT 235 228 217   Cardiac Enzymes: No results found for this basename: CKTOTAL, CKMB, CKMBINDEX, TROPONINI,  in the last 168 hours BNP (last 3 results) No results found for this basename: PROBNP,  in the last 8760 hours CBG:  Recent Labs Lab 08/21/14 2046 08/21/14 2355 08/22/14 0316 08/22/14 0753 08/22/14 1159  GLUCAP 136* 107* 119* 137* 172*    Recent Results (from the past 240 hour(s))  URINE CULTURE     Status: None   Collection Time    08/19/14  6:12 PM      Result Value Ref Range Status   Specimen Description URINE, SUPRAPUBIC   Final   Special Requests NONE   Final   Culture  Setup Time     Final   Value: 08/20/2014 02:04     Performed at [place]     Final   Value: 60,000 COLONIES/ML     Performed at [facility]   Culture     Final   Value: YEAST     Performed at [facility]   Report Status 08/21/2014 FINAL   Final     Studies: Dg Abd 1 View  08/21/2014   CLINICAL DATA:  Abdominal pain, colonic distension  EXAM: ABDOMEN - 1 VIEW  COMPARISON:  08/20/2014  FINDINGS: Persistent mild gaseous distended colonic loops in mid abdomen suspicious for residual ileus. Surgical clips are noted in right abdomen. Pelvic phleboliths are noted.  IMPRESSION: Persistent mild gaseous distended colonic loops in mid abdomen suspicious for residual  ileus.   Electronically Signed   By: Lahoma Crocker M.D.   On: 08/21/2014 09:51   Ct Head Wo Contrast  08/22/2014   CLINICAL DATA:  Severe headache, history of neuroendocrine cancer, von Hippel-Lindau syndrome, chemotherapy and XRT complete  EXAM: CT HEAD WITHOUT CONTRAST  TECHNIQUE: Contiguous axial images were obtained from the base of the skull through the vertex without intravenous contrast.  COMPARISON:  MR brain dated 08/18/2011  FINDINGS: No evidence of parenchymal hemorrhage or extra-axial fluid collection.  Vague hypodensity in the right cerebellum (series 2/image 8) with associated mass effect on the 4th ventricle. While some of this may reflect postsurgical changes given the associated suboccipital craniectomy, recurrent tumor is difficult to exclude given the mass effect. No midline shift.  Notably, tumor was also present within the medulla/brainstem on the prior  MRI, although this is poorly evaluated on the current noncontrast CT.  No CT evidence of acute infarction.  Mild subcortical white matter and periventricular small vessel ischemic changes.  Mild global cortical atrophy.  No ventriculomegaly.  The visualized paranasal sinuses are essentially clear. The mastoid air cells are unopacified.  No evidence of calvarial fracture.  IMPRESSION: Vague hypodensity in the right cerebellum with mass effect on the 4th ventricle, poorly evaluated on the current noncontrast CT.  This appearance may reflect postsurgical changes given associated suboccipital craniectomy, although recurrent mass/metastasis is not excluded. If there is concern for tumor, consider MR brain/cervical spine with contrast.  Otherwise, no evidence of acute intracranial abnormality.   Electronically Signed   By: Julian Hy M.D.   On: 08/22/2014 11:13    Scheduled Meds: . baclofen  20 mg Oral 5 X Daily  . cefTAZidime (FORTAZ)  IV  1 g Intravenous Q8H  . diazepam  5 mg Intravenous 3 times per day  . enoxaparin (LOVENOX) injection   40 mg Subcutaneous Q24H  . fluconazole  100 mg Oral Daily  . insulin aspart  0-9 Units Subcutaneous Q4H  . insulin glargine  18 Units Subcutaneous Daily  . polyethylene glycol  17 g Oral BID  . potassium chloride  40 mEq Oral TID  . senna  2 tablet Oral Daily   Continuous Infusions: . sodium chloride 75 mL/hr at 08/21/14 2036    Principal Problem:   Sigmoid volvulus Active Problems:   Neuroendocrine carcinoma   Neurogenic bladder   Iron deficiency anemia, unspecified   HTN (hypertension)   Diabetes mellitus secondary to pancreatectomy   Orthostatic hypotension   Dehydration   Anemia   UTI (lower urinary tract infection)    Time spent: 25 min    [facility]  [facility] Pager [phone] 7PM-7AM, please contact night-coverage at [email or web address], password [facility] 08/22/2014, 12:23 PM  LOS: 3 days

## 2014-08-23 DIAGNOSIS — C7A00 Malignant carcinoid tumor of unspecified site: Secondary | ICD-10-CM

## 2014-08-23 LAB — GLUCOSE, CAPILLARY
GLUCOSE-CAPILLARY: 162 mg/dL — AB (ref 70–99)
GLUCOSE-CAPILLARY: 250 mg/dL — AB (ref 70–99)
Glucose-Capillary: 157 mg/dL — ABNORMAL HIGH (ref 70–99)
Glucose-Capillary: 191 mg/dL — ABNORMAL HIGH (ref 70–99)

## 2014-08-23 MED ORDER — MIDODRINE HCL 5 MG PO TABS: 10.0000 mg | ORAL_TABLET | Freq: Two times a day (BID) | ORAL | Status: DC | PRN

## 2014-08-23 MED ORDER — POLYETHYLENE GLYCOL 3350 17 G PO PACK: 17.0000 g | PACK | Freq: Every day | ORAL | Status: DC

## 2014-08-23 MED ORDER — POLYETHYLENE GLYCOL 3350 17 G PO PACK: 17.0000 g | PACK | Freq: Two times a day (BID) | ORAL | Status: DC

## 2014-08-23 NOTE — Discharge Summary (Signed)
Physician Discharge Summary  [name]. EXB:[id number] DOB: 06-20-1961 DOA: 08/19/2014  PCP: Volanda Napoleon, MD  Admit date: 08/19/2014 Discharge date: 08/23/2014  Time spent: 40 minutes  Recommendations for Outpatient Follow-up:  1. Followup with Dr. Marin Olp in one week.  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 is a 53 y.o. 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 The patient and wife the symptoms started 1-1/2 weeks ago which also included sweats abdominal pain with spasms which have been intermittent as well as labile blood pressure.  Per patient and wife it was thought that patient's symptoms were likely secondary to constipation and they spoke with gastroenterology who recommended a trial of laxatives. Patient had 3 doses of MiraLAX 3 days prior to admission and had a moderate amount of some liquid stool. Patient continued to have abdominal spasms which were excruciating and moving up to his shoulders bilaterally with some associated headache 1-2 days  prior to admission. Patient subsequently presented to the emergency room.  Patient doesn't go as low grade fever with a temp of 100.1, some nausea. Patient denies any chest pain, no shortness of breath, no hemorrhage emesis, no emesis, no melena, no hematochezia, no dysuria, no cough.  Patient had been seen by urology 5 days prior to admission diagnosed with a urinary tract infection which her urologist grew out Proteus mirabilis and Escherichia coli. Patient was placed on Septra.  Patient presented to the emergency room, comprehensive metabolic profile done on a chloride of 90 sodium of 1:30. BUN of 24 glucose of 347 alk phosphatase of 151 albumin of 3.3 protein of 8.4 otherwise was within normal limits. Lipase level was at 6. CBC which was done was unremarkable. Acute abdominal series which was done was concern of a generalized ileus versus distal colonic obstructive pattern with no evidence of perforation in no acute cardiopulmonary abnormality. CT of the abdomen and pelvis which was done was concerning for volvulus versus partial obstruction.  ED physician consulted GI. [facility] was called to admit the patient for further evaluation and management.   Hospital Course:   Abdominal pain  -Initial CT scan of abdomen/pelvis showed possible volvulus.  -GI evaluated the patient, partial colonoscopy done yesterday to the mid point of transverse colon.  -Per GI report there is no volvulus.  -Repeat x-ray showed distended colon loops consistent with partial ileus.  -Oral laxatives and enemas continued as needed.  -Patient tolerated regular food and having bowel movement, this is resolved.  Headache  -Complains about severe headache, patient has chronic headache but this is different. -Please note that patient has history of cerebellar  tumor which measures about 6-7 mm per his wife.  -Because of the severe headache, CT scan of the brain, MRI of the brain/C-spine done. -Overall intracranial  findings we'll change, but the C-spine nodules has progressed since 2012. -Please see the complete MRI report attached to this discharge summary. Patient will followup with his oncologist.  Muscle spasm  -Patient reported that he has recurrent back, abdominal muscles and upper extremity muscle spasms.  -On multiple muscle relaxants at home including baclofen and Valium and Zanaflex as needed.  -This seems better.  Hypertension  -Patient has labile hypertension, with blood pressure fluctuations.  -Urine metanephrines sent to rule out pheochromocytoma.   Diabetes mellitus  -Continue Lantus 18 units subcutaneous daily along with sliding scale insulin with NovoLog.  -Discharge and see dose.  Recent UTI  -Has suprapubic catheter, urinalysis was consistent with UTI, showed some yeast Diflucan added  -At his Urologist urologist office, urine grew Proteus mirabilis and Escherichia coli. Started on IV Fortaz. -Urinalysis did not grow any organism this time, IV antibiotics and Diflucan discontinued at the time of discharge.   History of neuroendocrine metastatic carcinoma/von Hippel-Lindau syndrome  -Continue monthly Sandostatin followup oncology as outpatient. -See MR scans, followup with Dr. Marin Olp next week.   Procedures:  Colonoscopy done on 08/20/14 by Dr. Cristina Gong and showed no gross abnormalities, specifically no volvulus.  Consultations:  Gastroenterology  Discharge Exam: Filed Vitals:   08/23/14 0300  BP: 136/80  Pulse: 81  Temp: 98.1 F (36.7 C)  Resp: 18   General: Alert and awake, oriented x3, not in any acute distress. Seen with wife at bedside HEENT: anicteric sclera, pupils reactive to light and accommodation, EOMI CVS: S1-S2 clear, no murmur rubs or gallops Chest: clear to auscultation bilaterally, no wheezing, rales or rhonchi Abdomen: soft nontender, nondistended, normal bowel sounds, no organomegaly Extremities: no cyanosis, clubbing or edema noted  bilaterally Neuro: Cranial nerves II-XII intact, no focal neurological deficits  Discharge Instructions You were cared for by a hospitalist during your hospital stay. If you have any questions about your discharge medications or the care you received while you were in the hospital after you are discharged, you can call the unit and asked to speak with the hospitalist on call if the hospitalist that took care of you is not available. Once you are discharged, your primary care physician will handle any further medical issues. Please note that NO REFILLS for any discharge medications will be authorized once you are discharged, as it is imperative that you return to your primary care physician (or establish a relationship with a primary care physician if you do not have one) for your aftercare needs so that they can reassess your need for medications and monitor your lab values.  Discharge Instructions   Diet - low sodium heart healthy    Complete by:  As directed      Increase activity slowly    Complete by:  As directed           Current Discharge Medication List    START taking these medications   Details  polyethylene glycol (MIRALAX / GLYCOLAX) packet Take 17 g by mouth daily. Qty: 30 each, Refills: 0      CONTINUE these medications which have CHANGED   Details  midodrine (PROAMATINE) 5 MG tablet Take 2 tablets (10 mg total) by mouth 2 (two) times daily as needed (Prior to ambulation).      CONTINUE these medications which have NOT CHANGED   Details  baclofen (LIORESAL) 20 MG tablet Take 20-40 mg by mouth 5 (five) times daily.    diazepam (VALIUM) 5 MG tablet Take 5 mg by mouth 3 (three) times daily as needed for muscle spasms.    ergocalciferol (VITAMIN D2) 50000 UNITS capsule Take 50,000 Units by mouth once a week. On Fridays    HYDROcodone-acetaminophen (NORCO/VICODIN) 5-325 MG per tablet Take 1 tablet by mouth every 6 (six) hours as needed for moderate pain.    HYDROmorphone  (DILAUDID) 4 MG tablet Take 4 mg by mouth every 6 (six) hours as needed for severe pain.    insulin glargine (LANTUS) 100 UNIT/ML injection Inject 25 Units into the skin daily before breakfast.    Associated Diagnoses: Neuroendocrine carcinoma    insulin lispro (HUMALOG) 100 UNIT/ML injection Inject 3-9 Units into the skin 3 (three) times daily before meals. SS   Associated Diagnoses: Neuroendocrine carcinoma    Pancrelipase, Lip-Prot-Amyl, 24000 UNITS CPEP Take 3 capsules by mouth 3 (three) times daily with meals.    promethazine (PHENERGAN) 12.5 MG tablet Take 12.5 mg by mouth every 6 (six) hours as needed for nausea.    Associated Diagnoses: Neuroendocrine carcinoma    tiZANidine (ZANAFLEX) 4 MG tablet Take 4 mg by mouth every 6 (six) hours as needed for muscle spasms.    traMADol (ULTRAM) 50 MG tablet Take 50 mg by mouth 3 (three) times daily.       Allergies  Allergen Reactions  . Ciprocin-Fluocin-Procin [Fluocinolone Acetonide] Rash    Pt states this happened with IV Cipro. ABLE TO TAKE PO CIPRO.  . Other Rash    IV-CIPRO   Follow-up Information   Follow up with Volanda Napoleon, MD On 09/03/2014.   Specialty:  Oncology   Contact information:   [address], [address] [place] [address] [phone]        The results of significant diagnostics from this hospitalization (including imaging, microbiology, ancillary and laboratory) are listed below for reference.    Significant Diagnostic Studies: Dg Abd 1 View  08/21/2014   CLINICAL DATA:  Abdominal pain, colonic distension  EXAM: ABDOMEN - 1 VIEW  COMPARISON:  08/20/2014  FINDINGS: Persistent mild gaseous distended colonic loops in mid abdomen suspicious for residual ileus. Surgical clips are noted in right abdomen. Pelvic phleboliths are noted.  IMPRESSION: Persistent mild gaseous distended colonic loops in mid abdomen suspicious for residual ileus.   Electronically Signed   By: Lahoma Crocker M.D.   On: 08/21/2014  09:51   Dg Abd 1 View  08/20/2014   CLINICAL DATA:  Followup colonic distention  EXAM: ABDOMEN - 1 VIEW  COMPARISON:  08/19/2014  FINDINGS: Scattered large and small bowel gas is noted. The degree of colonic distention has improved significantly in the interval from the prior exam. Persistent fecal material is noted within the right colon. A suprapubic catheter is noted in place. No acute abnormality is seen.  IMPRESSION: Improvement in the degree of colonic distention from the prior exam. No acute abnormality is noted.   Electronically Signed   By: Inez Catalina M.D.   On: 08/20/2014 11:47   Ct Head Wo Contrast  08/22/2014   CLINICAL DATA:  Severe headache, history of neuroendocrine cancer, von Hippel-Lindau syndrome, chemotherapy and XRT complete  EXAM: CT HEAD WITHOUT CONTRAST  TECHNIQUE: Contiguous axial images were obtained from the base of the skull through the vertex without intravenous contrast.  COMPARISON:  MR brain dated 08/18/2011  FINDINGS: No evidence of parenchymal hemorrhage  or extra-axial fluid collection.  Vague hypodensity in the right cerebellum (series 2/image 8) with associated mass effect on the 4th ventricle. While some of this may reflect postsurgical changes given the associated suboccipital craniectomy, recurrent tumor is difficult to exclude given the mass effect. No midline shift.  Notably, tumor was also present within the medulla/brainstem on the prior MRI, although this is poorly evaluated on the current noncontrast CT.  No CT evidence of acute infarction.  Mild subcortical white matter and periventricular small vessel ischemic changes.  Mild global cortical atrophy.  No ventriculomegaly.  The visualized paranasal sinuses are essentially clear. The mastoid air cells are unopacified.  No evidence of calvarial fracture.  IMPRESSION: Vague hypodensity in the right cerebellum with mass effect on the 4th ventricle, poorly evaluated on the current noncontrast CT.  This appearance may  reflect postsurgical changes given associated suboccipital craniectomy, although recurrent mass/metastasis is not excluded. If there is concern for tumor, consider MR brain/cervical spine with contrast.  Otherwise, no evidence of acute intracranial abnormality.   Electronically Signed   By: Julian Hy M.D.   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 Initial encounter.  EXAM: MRI HEAD WITHOUT AND WITH CONTRAST  MRI CERVICAL SPINE WITHOUT AND WITH CONTRAST  TECHNIQUE: Multiplanar, multiecho pulse sequences of the brain and surrounding structures, and cervical spine, to include the craniocervical junction and cervicothoracic junction, were obtained without and with intravenous contrast.  CONTRAST:  85m MULTIHANCE GADOBENATE DIMEGLUMINE 529 MG/ML IV SOLN  COMPARISON:  08/22/2014 head CT. 08/18/2011 outside brain MR. 04/24/2009 cervical spine MR.  FINDINGS: MRI HEAD FINDINGS  No acute infarct.  When compared to the outside 2012 MR, patient has undergone right occipital craniectomy for resection of right cerebellar mass and upper cervical spine mass.  Marked thinning of the cervical medullary junction where previously 1.8 cm mass and smaller 7 mm and 6 mm nodular masses were noted. Minimal enhancement of the cord just below this region which may represent residual tumor.  Blood breakdown products surround the right cerebellar resection site. Previously 1 cm enhancing lesion at this level was noted. Now present is a 1.8 x 1.6 x 1.7 cm complex cystic structure with anterior 1 cm enhancing irregular nodule most suggestive of hemangioblastoma.  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  Right paracentral  superior vermis 5.4 mm lesion previously measuring 4.6 mm consistent with hemangioblastomas.  Within the left cerebellum where patient previously had 5.9 an 1.9 mm enhancing lesion, tiny nodule enhancement/hemorrhage now noted which may represent residual of treated hemangioblastomas.  3 mm posterior right cerebellar enhancing lesion previously 2 mm suggestive of hemangioblastomas.  Very mild global atrophy without hydrocephalus.  Major intracranial vascular structures are patent.  Left orbital exoneration with prosthesis in place.  Mild enhancement of the posterior right optic nerve questioned on series 6, image 26 but not confirmed on coronal imaging.  MRI CERVICAL SPINE FINDINGS  Marked expansion of the cervical cord which contains lesion suspicious for hemangioblastomas rather than metastatic disease including:  C2-3 level right paracentral 6.4 mm nodule previously measuring 4.8 mm.  C3-4 level posterior mass slightly greater to the right with maximal dimension of 15.5 mm versus prior maximal dimension  of 7.6 mm.  C5-6 level 5 mm, 3 mm in 2 mm nodule previously measuring up to 3 mm, 2 mm and 1 mm.  C6-7 level 5.4 mm bilobed nodular enhancing lesion previously measuring up to 4.8 mm.  C7 level 6.4 mm nodule previously measuring up to 4.4 mm.  C7-T1 level 2 mm nodule previously barely visualized.  Tiny nodules T1-2 level and plaque-like nodule T3 level incompletely assessed.  Cystic changes within the cord at the C2-3 level suspected minimally different than on the prior exam.  Small amount of blood breakdown products within the cord at the C4 level.  Baseline cervical spondylotic changes including:  C2-3:  Mild right foraminal narrowing.  C3-4: Shallow broad-based disc osteophyte greater to the right. Mild right-sided spinal stenosis and mild to moderate right foraminal narrowing. Buckling posterior ligaments contributes to slight wasting of the cord.  C4-5: Moderate right foraminal narrowing.  C5-6:  Mild  moderate bilateral foraminal narrowing.  Prior surgery.  C6-7: Bulge/ spur with mild ventral thecal sac flattening. Mild bilateral foraminal narrowing.  IMPRESSION: MRI HEAD:  Postsurgical changes upper cervical spine and right cerebellum.  Marked thinning of the cervical medullary junction where previously 1.8 cm mass and smaller 7 mm and 6 mm nodular masses were noted. Minimal enhancement of the cord just below this region which may represent residual tumor.  Lesions within the posterior fossa which have progressed include:  1.8 x 1.6 x 1.7 cm complex cystic structure with anterior 1 cm enhancing irregular nodule right cerebellum at the level of resection site with peripheral blood breakdown products most suggestive of hemangioblastoma. Previously solid enhancing 1 cm lesion at this level was noted.  Right paracentral superior vermis 5.4 mm lesion previously measuring 4.6 mm consistent with hemangioblastomas.  3 mm posterior right cerebellar enhancing lesion previously 2 mm suggestive of hemangioblastomas.  Lesions which are without change include:  7 mm mass at the foramen of Megendie consistent with a hemangioblastoma without significant change from prior exam.  Lesions which appear improved include:  Within the left cerebellum where patient previously had 5.9 an 1.9 mm enhancing lesion, tiny nodule enhancement/hemorrhage now noted which may represent residual of treated hemangioblastomas.  Metastatic disease as cause of the enhancing lesions felt to be a less likely consideration but not excluded.  No acute infarct  MRI CERVICAL SPINE :  Marked expansion of the cervical cord once again noted. Several cord enhancing lesions suspicious for hemangioblastomas which have increased in size when compared to 2010 exam.  Largest lesion at the C3-4 level with maximal dimension of 15.5 mm versus prior maximal dimension of 7.6 mm. Please see above for further detail.  Metastatic disease as cause for enhancing lesions felt  to be less likely consideration.   Electronically Signed   By: Chauncey Cruel M.D.   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 Initial encounter.  EXAM: MRI HEAD WITHOUT AND WITH CONTRAST  MRI CERVICAL SPINE WITHOUT AND WITH CONTRAST  TECHNIQUE: Multiplanar, multiecho pulse sequences of the brain and surrounding structures, and cervical spine, to include the craniocervical junction and cervicothoracic junction, were obtained without and with intravenous contrast.  CONTRAST:  16m MULTIHANCE GADOBENATE DIMEGLUMINE 529 MG/ML IV SOLN  COMPARISON:  08/22/2014 head CT. 08/18/2011 outside brain MR. 04/24/2009 cervical  spine MR.  FINDINGS: MRI HEAD FINDINGS  No acute infarct.  When compared to the outside 2012 MR, patient has undergone right occipital craniectomy for resection of right cerebellar mass and upper cervical spine mass.  Marked thinning of the cervical medullary junction where previously 1.8 cm mass and smaller 7 mm and 6 mm nodular masses were noted. Minimal enhancement of the cord just below this region which may represent residual tumor.  Blood breakdown products surround the right cerebellar resection site. Previously 1 cm enhancing lesion at this level was noted. Now present is a 1.8 x 1.6 x 1.7 cm complex cystic structure with anterior 1 cm enhancing irregular nodule most suggestive of hemangioblastoma.  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  Right paracentral superior vermis 5.4 mm lesion previously measuring 4.6 mm consistent with hemangioblastomas.  Within the left cerebellum where patient previously had 5.9 an 1.9 mm enhancing lesion, tiny nodule enhancement/hemorrhage now noted  which may represent residual of treated hemangioblastomas.  3 mm posterior right cerebellar enhancing lesion previously 2 mm suggestive of hemangioblastomas.  Very mild global atrophy without hydrocephalus.  Major intracranial vascular structures are patent.  Left orbital exoneration with prosthesis in place.  Mild enhancement of the posterior right optic nerve questioned on series 6, image 26 but not confirmed on coronal imaging.  MRI CERVICAL SPINE FINDINGS  Marked expansion of the cervical cord which contains lesion suspicious for hemangioblastomas rather than metastatic disease including:  C2-3 level right paracentral 6.4 mm nodule previously measuring 4.8 mm.  C3-4 level posterior mass slightly greater to the right with maximal dimension of 15.5 mm versus prior maximal dimension of 7.6 mm.  C5-6 level 5 mm, 3 mm in 2 mm nodule previously measuring up to 3 mm, 2 mm and 1 mm.  C6-7 level 5.4 mm bilobed nodular enhancing lesion previously measuring up to 4.8 mm.  C7 level 6.4 mm nodule previously measuring up to 4.4 mm.  C7-T1 level 2 mm nodule previously barely visualized.  Tiny nodules T1-2 level and plaque-like nodule T3 level incompletely assessed.  Cystic changes within the cord at the C2-3 level suspected minimally different than on the prior exam.  Small amount of blood breakdown products within the cord at the C4 level.  Baseline cervical spondylotic changes including:  C2-3:  Mild right foraminal narrowing.  C3-4: Shallow broad-based disc osteophyte greater to the right. Mild right-sided spinal stenosis and mild to moderate right foraminal narrowing. Buckling posterior ligaments contributes to slight wasting of the cord.  C4-5: Moderate right foraminal narrowing.  C5-6:  Mild moderate bilateral foraminal narrowing.  Prior surgery.  C6-7: Bulge/ spur with mild ventral thecal sac flattening. Mild bilateral foraminal narrowing.  IMPRESSION: MRI HEAD:  Postsurgical changes upper cervical spine and right  cerebellum.  Marked thinning of the cervical medullary junction where previously 1.8 cm mass and smaller 7 mm and 6 mm nodular masses were noted. Minimal enhancement of the cord just below this region which may represent residual tumor.  Lesions within the posterior fossa which have progressed include:  1.8 x 1.6 x 1.7 cm complex cystic structure with anterior 1 cm enhancing irregular nodule right cerebellum at the level of resection site with peripheral blood breakdown products most suggestive of hemangioblastoma. Previously solid enhancing 1 cm lesion at this level was noted.  Right paracentral superior vermis 5.4 mm lesion previously measuring 4.6 mm consistent with hemangioblastomas.  3 mm  posterior right cerebellar enhancing lesion previously 2 mm suggestive of hemangioblastomas.  Lesions which are without change include:  7 mm mass at the foramen of Megendie consistent with a hemangioblastoma without significant change from prior exam.  Lesions which appear improved include:  Within the left cerebellum where patient previously had 5.9 an 1.9 mm enhancing lesion, tiny nodule enhancement/hemorrhage now noted which may represent residual of treated hemangioblastomas.  Metastatic disease as cause of the enhancing lesions felt to be a less likely consideration but not excluded.  No acute infarct  MRI CERVICAL SPINE :  Marked expansion of the cervical cord once again noted. Several cord enhancing lesions suspicious for hemangioblastomas which have increased in size when compared to 2010 exam.  Largest lesion at the C3-4 level with maximal dimension of 15.5 mm versus prior maximal dimension of 7.6 mm. Please see above for further detail.  Metastatic disease as cause for enhancing lesions felt to be less likely consideration.   Electronically Signed   By: Chauncey Cruel M.D.   On: 08/22/2014 21:24   Ct Abdomen Pelvis W Contrast  08/20/2014   ADDENDUM REPORT: 08/20/2014 11:05  ADDENDUM: The note that under the  impression, it should state that the multiple enhancing solid and cystic renal masses which likely represent multiple renal cell carcinomas are NOT significantly changed.   Electronically Signed   By: Marin Olp M.D.   On: 08/20/2014 11:05   08/20/2014   CLINICAL DATA:  Severe abdominal pain and back spasms. No bowel movement in 4 days. History of metastatic neuroendocrine cancer 2009 post chemotherapy and Whipple's procedure. Von Hippel-Lindau syndrome.  EXAM: CT ABDOMEN AND PELVIS WITH CONTRAST  TECHNIQUE: Multidetector CT imaging of the abdomen and pelvis was performed using the standard protocol following bolus administration of intravenous contrast.  CONTRAST:  166m OMNIPAQUE IOHEXOL 300 MG/ML  SOLN  COMPARISON:  07/09/2014, 04/11/2014 and 06/21/2012  FINDINGS: The lung bases demonstrate a 4 mm nodule over the right lower lobe without significant change from 04/11/2014.  Abdominal images demonstrate diffuse hepatic steatosis. There are several hypervascular enhancing liver masses with the largest more heterogeneous area over the left lobe which appears slightly more prominent measuring 2.9 x 6.9 cm. There is evidence of patient's prior Whipple procedure without significant residual pancreatic tissue. There has been a prior cholecystectomy. Minimal pneumobilia over the left lobe unchanged. The spleen and adrenal glands are within normal. Stomach is somewhat distended. There is calcified plaque over the abdominal aorta. Numerous collateral arterial vessels over the anterior and left mid abdomen.  There are no dilated small bowel loops. There is moderate air and fecal retention over the ascending and transverse colon extending towards the splenic flexure and proximal descending colon. There is moderate redundancy of the sigmoid and descending colon. There is an abrupt caliber change of the descending colon in the mid abdomen just left of midline with a whirling of the mesentery across the midline with air and  stool seen within the colon distal to this point in the distal descending and rectosigmoid colon. This likely represents a colonic volvulus which may be intermittent. No evidence of free peritoneal air. No free fluid.  Kidneys are normal size with numerous simple and hyperdense cysts. There are several enhancing solid and cystic renal masses bilaterally left worse than right with the largest measuring 3.1 x 3.7 cm over the mid pole of the left kidney likely multiple renal cell carcinomas without significant change. Several left renal stones unchanged. Multiple metallic densities over  the right kidney likely postsurgical. There is no hydronephrosis. Ureters are within normal.  Pelvic images demonstrate a suprapubic catheter in adequate position. There is surface retro bladder wall thickening. Remaining pelvic structures are unchanged. There are multiple areas of enhancement within the spinal canal unchanged. Remainder the exam is unchanged.  IMPRESSION: Redundancy of the sigmoid and descending colon with findings suggesting volvulus over the mid descending colon just left of midline in the mid abdomen which may be intermittent as air and stool are seen distal to this abrupt region of narrowing.  Changes compatible patient's prior Whipple procedure. Multiple hypervascular lesions within the liver with slight progression as described. 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  4 mm right lower lobe nodule unchanged from May 2015.  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  Other chronic axillary findings  unchanged.  Critical Value/emergent results were called by telephone at the time of interpretation on 08/19/2014 at 2:09 pm to Dr. Lacretia Leigh , who verbally acknowledged these results.  Electronically Signed: By: Marin Olp M.D. On: 08/19/2014 14:09   Dg Abd Acute W/chest  08/19/2014   CLINICAL DATA:  Abdominal pain; history of Whipple procedure 2 years ago and history of previous right nephrectomy  EXAM: ACUTE ABDOMEN SERIES (ABDOMEN 2 VIEW & CHEST 1 VIEW)  COMPARISON:  CT scan of the abdomen and pelvis of July 09, 2014  FINDINGS: The left lung is clear. On the right the aerated portion of the lung is clear. The hemidiaphragm is elevated. The heart is top-normal in size. The pulmonary vascularity is not engorged. Within the abdomen there are loops of moderately distended gas-filled small and large bowel. No definite rectal gas is demonstrated. No free extraluminal gas collections are demonstrated. There are numerous surgical clips within the abdomen as well as within the pelvis.  IMPRESSION: Generalized ileus versus distal colonic obstructive pattern. There is no evidence of perforation. No acute cardiopulmonary abnormality is demonstrated here   Electronically Signed   By: David  Martinique   On: 08/19/2014 09:45    Microbiology: Recent Results (from the past 240 hour(s))  URINE CULTURE     Status: None   Collection Time    08/19/14  6:12 PM      Result Value Ref Range Status   Specimen Description URINE, SUPRAPUBIC   Final   Special Requests NONE   Final   Culture  Setup Time     Final   Value: 08/20/2014 02:04     Performed at [facility]     Final   Value: 60,000 COLONIES/ML     Performed at [facility]   Culture     Final   Value: YEAST     Performed at [facility]   Report Status 08/21/2014 FINAL   Final     Labs: Basic Metabolic Panel:  Recent Labs Lab 08/19/14 1150 08/19/14 2102 08/20/14 0412 08/21/14 0426 08/22/14 0415  NA 133*   --  144 141 144  K 5.0  --  3.7 3.6* 4.2  CL 90*  --  102 104 106  CO2 27  --  '24 23 27  ' GLUCOSE 347*  --  129* 116* 132*  BUN 24*  --  25* 18 11  CREATININE 1.06  --  1.06 0.81 0.82  CALCIUM 9.6  --  8.8 9.0 8.8  MG  --  2.3  --   --   --    Liver Function Tests:  Recent Labs Lab 08/19/14 1150 08/20/14 0412  AST 23 18  ALT 17 12  ALKPHOS 151* 118*  BILITOT 0.5 0.3  PROT 8.4* 7.4  ALBUMIN 3.3* 2.9*    Recent Labs Lab 08/19/14 1150  LIPASE 6*   No results found for this basename: AMMONIA,  in the last 168 hours CBC:  Recent Labs Lab 08/19/14 1150 08/20/14 0412 08/21/14 0426  WBC 9.9 9.3 8.8  NEUTROABS 7.4 6.4  --   HGB 15.0 13.5 13.5  HCT 45.3 40.9 41.9  MCV 80.9 80.2 81.7  PLT 235 228 217   Cardiac Enzymes: No results found for this basename: CKTOTAL, CKMB, CKMBINDEX, TROPONINI,  in the last 168 hours BNP: BNP (last 3 results) No results found for this basename: PROBNP,  in the last 8760 hours CBG:  Recent Labs Lab 08/22/14 1619 08/22/14 2000 08/22/14 2348 08/23/14 0357 08/23/14 0718  GLUCAP 184* 107* 191* 157* 162*       Signed:  Jacquelina Hewins A  [facility] 08/23/2014, 11:01 AM

## 2014-08-24 LAB — METANEPHRINES, URINE, 24 HOUR
METANEPHRINES UR: 176 ug/(24.h) (ref 90–315)
Volume, Urine-METAN: 1000 mL

## 2014-08-25 LAB — CATECHOLAMINES, FRACTIONATED, URINE, 24 HOUR
Catecholamines T: 69 mcg/24 h (ref 26–121)
Creatinine, Urine mg/day-CATEUR: 0.91 g/(24.h) (ref 0.63–2.50)
Dopamine 24 Hr Urine: 108 mcg/24 h (ref 52–480)
Epinephrine 24 Hr Urine: 6 mcg/24 h (ref 2–24)
Norepinephrine 24 Hr Urine: 63 mcg/24 h (ref 15–100)
Total urine volume: 1000 mL

## 2014-08-27 ENCOUNTER — Telehealth: Payer: Self-pay | Admitting: Hematology & Oncology

## 2014-08-27 NOTE — Telephone Encounter (Signed)
Faxed [employer] 08/23/2014 & 08/27/2014 to:  F: [phone] P: [phone]    REF: [id number]    COPY SCANNED

## 2014-09-02 DIAGNOSIS — N319 Neuromuscular dysfunction of bladder, unspecified: Secondary | ICD-10-CM | POA: Diagnosis not present

## 2014-09-02 DIAGNOSIS — Q859 Phakomatosis, unspecified: Secondary | ICD-10-CM | POA: Diagnosis not present

## 2014-09-03 ENCOUNTER — Ambulatory Visit (HOSPITAL_BASED_OUTPATIENT_CLINIC_OR_DEPARTMENT_OTHER): Payer: 59

## 2014-09-03 ENCOUNTER — Ambulatory Visit (HOSPITAL_BASED_OUTPATIENT_CLINIC_OR_DEPARTMENT_OTHER): Payer: 59 | Admitting: Hematology & Oncology

## 2014-09-03 ENCOUNTER — Encounter: Payer: Self-pay | Admitting: Hematology & Oncology

## 2014-09-03 ENCOUNTER — Other Ambulatory Visit (HOSPITAL_BASED_OUTPATIENT_CLINIC_OR_DEPARTMENT_OTHER): Payer: 59 | Admitting: Lab

## 2014-09-03 VITALS — BP 99/69 | HR 92 | Temp 97.8°F | Resp 18 | Ht 69.0 in

## 2014-09-03 DIAGNOSIS — C7A00 Malignant carcinoid tumor of unspecified site: Secondary | ICD-10-CM

## 2014-09-03 DIAGNOSIS — E139 Other specified diabetes mellitus without complications: Secondary | ICD-10-CM

## 2014-09-03 DIAGNOSIS — I152 Hypertension secondary to endocrine disorders: Secondary | ICD-10-CM

## 2014-09-03 DIAGNOSIS — C7A8 Other malignant neuroendocrine tumors: Secondary | ICD-10-CM

## 2014-09-03 DIAGNOSIS — Q859 Phakomatosis, unspecified: Secondary | ICD-10-CM

## 2014-09-03 DIAGNOSIS — E891 Postprocedural hypoinsulinemia: Secondary | ICD-10-CM

## 2014-09-03 DIAGNOSIS — G839 Paralytic syndrome, unspecified: Secondary | ICD-10-CM

## 2014-09-03 DIAGNOSIS — Z90410 Acquired total absence of pancreas: Secondary | ICD-10-CM

## 2014-09-03 DIAGNOSIS — K7689 Other specified diseases of liver: Secondary | ICD-10-CM

## 2014-09-03 DIAGNOSIS — C787 Secondary malignant neoplasm of liver and intrahepatic bile duct: Secondary | ICD-10-CM

## 2014-09-03 DIAGNOSIS — Z23 Encounter for immunization: Secondary | ICD-10-CM

## 2014-09-03 LAB — CBC WITH DIFFERENTIAL (CANCER CENTER ONLY)
BASO#: 0 10*3/uL (ref 0.0–0.2)
BASO%: 0.3 % (ref 0.0–2.0)
EOS ABS: 0.1 10*3/uL (ref 0.0–0.5)
EOS%: 1.8 % (ref 0.0–7.0)
HEMATOCRIT: 43.2 % (ref 38.7–49.9)
HEMOGLOBIN: 14.1 g/dL (ref 13.0–17.1)
LYMPH#: 1.4 10*3/uL (ref 0.9–3.3)
LYMPH%: 17.8 % (ref 14.0–48.0)
MCH: 27.4 pg — ABNORMAL LOW (ref 28.0–33.4)
MCHC: 32.6 g/dL (ref 32.0–35.9)
MCV: 84 fL (ref 82–98)
MONO#: 0.7 10*3/uL (ref 0.1–0.9)
MONO%: 9.3 % (ref 0.0–13.0)
NEUT%: 70.8 % (ref 40.0–80.0)
NEUTROS ABS: 5.4 10*3/uL (ref 1.5–6.5)
Platelets: 301 10*3/uL (ref 145–400)
RBC: 5.15 10*6/uL (ref 4.20–5.70)
RDW: 16.3 % — ABNORMAL HIGH (ref 11.1–15.7)
WBC: 7.6 10*3/uL (ref 4.0–10.0)

## 2014-09-03 LAB — CMP (CANCER CENTER ONLY)
ALBUMIN: 3 g/dL — AB (ref 3.3–5.5)
ALT(SGPT): 34 U/L (ref 10–47)
AST: 43 U/L — ABNORMAL HIGH (ref 11–38)
Alkaline Phosphatase: 151 U/L — ABNORMAL HIGH (ref 26–84)
BUN: 14 mg/dL (ref 7–22)
CHLORIDE: 97 meq/L — AB (ref 98–108)
CO2: 30 mEq/L (ref 18–33)
Calcium: 9 mg/dL (ref 8.0–10.3)
Creat: 1 mg/dl (ref 0.6–1.2)
GLUCOSE: 256 mg/dL — AB (ref 73–118)
POTASSIUM: 3.4 meq/L (ref 3.3–4.7)
Sodium: 139 mEq/L (ref 128–145)
Total Bilirubin: 0.5 mg/dl (ref 0.20–1.60)
Total Protein: 7.3 g/dL (ref 6.4–8.1)

## 2014-09-03 MED ORDER — OCTREOTIDE ACETATE 30 MG IM KIT
PACK | INTRAMUSCULAR | Status: AC
  Filled 2014-09-03: qty 1

## 2014-09-03 MED ORDER — INFLUENZA VAC SPLIT QUAD 0.5 ML IM SUSY
0.5000 mL | PREFILLED_SYRINGE | Freq: Once | INTRAMUSCULAR | Status: AC
  Administered 2014-09-03: 0.5 mL via INTRAMUSCULAR
  Filled 2014-09-03: qty 0.5

## 2014-09-03 MED ORDER — DOXAZOSIN MESYLATE 1 MG PO TABS: 1.0000 mg | ORAL_TABLET | Freq: Two times a day (BID) | ORAL | Status: DC

## 2014-09-03 MED ORDER — OCTREOTIDE ACETATE 30 MG IM KIT
30.0000 mg | PACK | Freq: Once | INTRAMUSCULAR | Status: AC
  Administered 2014-09-03: 30 mg via INTRAMUSCULAR

## 2014-09-03 NOTE — Patient Instructions (Signed)
Octreotide injection solution What is this medicine? OCTREOTIDE (ok TREE oh tide) is used to reduce blood levels of growth hormone in patients with a condition called acromegaly. This medicine also reduces flushing and watery diarrhea caused by certain types of cancer. This medicine may be used for other purposes; ask your health care provider or pharmacist if you have questions. COMMON BRAND NAME(S): Sandostatin, Sandostatin LAR What should I tell my health care provider before I take this medicine? They need to know if you have any of these conditions: -gallbladder disease -kidney disease -liver disease -an unusual or allergic reaction to octreotide, other medicines, foods, dyes, or preservatives -pregnant or trying to get pregnant -breast-feeding How should I use this medicine? This medicine is for injection under the skin or into a vein (only in emergency situations). It is usually given by a health care professional in a hospital or clinic setting. If you get this medicine at home, you will be taught how to prepare and give this medicine. Allow the injection solution to come to room temperature before use. Do not warm it artificially. Use exactly as directed. Take your medicine at regular intervals. Do not take your medicine more often than directed. It is important that you put your used needles and syringes in a special sharps container. Do not put them in a trash can. If you do not have a sharps container, call your pharmacist or healthcare provider to get one. Talk to your pediatrician regarding the use of this medicine in children. Special care may be needed. Overdosage: If you think you have taken too much of this medicine contact a poison control center or emergency room at once. NOTE: This medicine is only for you. Do not share this medicine with others. What if I miss a dose? If you miss a dose, take it as soon as you can. If it is almost time for your next dose, take only that  dose. Do not take double or extra doses. What may interact with this medicine? Do not take this medicine with any of the following medications: -cisapride -droperidol -general anesthetics -grepafloxacin -perphenazine -thioridazine This medicine may also interact with the following medications: -bromocriptine -cyclosporine -diuretics -medicines for blood pressure, heart disease, irregular heart beat -medicines for diabetes, including insulin -quinidine This list may not describe all possible interactions. Give your health care provider a list of all the medicines, herbs, non-prescription drugs, or dietary supplements you use. Also tell them if you smoke, drink alcohol, or use illegal drugs. Some items may interact with your medicine. What should I watch for while using this medicine? Visit your doctor or health care professional for regular checks on your progress. To help reduce irritation at the injection site, use a different site for each injection and make sure the solution is at room temperature before use. This medicine may cause increases or decreases in blood sugar. Signs of high blood sugar include frequent urination, unusual thirst, flushed or dry skin, difficulty breathing, drowsiness, stomach ache, nausea, vomiting or dry mouth. Signs of low blood sugar include chills, cool, pale skin or cold sweats, drowsiness, extreme hunger, fast heartbeat, headache, nausea, nervousness or anxiety, shakiness, trembling, unsteadiness, tiredness, or weakness. Contact your doctor or health care professional right away if you experience any of these symptoms. What side effects may I notice from receiving this medicine? Side effects that you should report to your doctor or health care professional as soon as possible: -allergic reactions like skin rash, itching or hives, swelling   of the face, lips, or tongue -changes in blood sugar -changes in heart rate -severe stomach pain Side effects that  usually do not require medical attention (report to your doctor or health care professional if they continue or are bothersome): -diarrhea or constipation -gas or stomach pain -nausea, vomiting -pain, redness, swelling and irritation at site where injected This list may not describe all possible side effects. Call your doctor for medical advice about side effects. You may report side effects to FDA at 1-800-FDA-1088. Where should I keep my medicine? Keep out of the reach of children. Store in a refrigerator between 2 and 8 degrees C (36 and 46 degrees F). Protect from light. Allow to come to room temperature naturally. Do not use artificial heat. If protected from light, the injection may be stored at room temperature between 20 and 30 degrees C (70 and 86 degrees F) for 14 days. After the initial use, throw away any unused portion of a multiple dose vial after 14 days. Throw away unused portions of the ampules after use. NOTE: This sheet is a summary. It may not cover all possible information. If you have questions about this medicine, talk to your doctor, pharmacist, or health care provider.  2015, Elsevier/Gold Standard. (2008-06-18 16:56:04) Cyclosporine injection What is this medicine? CYCLOSPORINE (SYE kloe spor een) is used to decrease the immune system's response to a transplanted organ. This medicine may be used for other purposes; ask your health care provider or pharmacist if you have questions. COMMON BRAND NAME(S): Sandimmune What should I tell my health care provider before I take this medicine? They need to know if you have any of these conditions: -gout -high blood pressure -infection -kidney disease -liver disease -recent vaccinations -an unusual or allergic reaction to cyclosporine, alcohol, castor oil, other medicines, foods, dyes, or preservatives -pregnant or trying to get pregnant -breast feeding How should I use this medicine? This medicine is for infusion into a  vein. It is given by a health care professional in a hospital or clinic setting. Talk to your pediatrician regarding the use of this medicine in children. Special care may be needed. Overdosage: If you think you have taken too much of this medicine contact a poison control center or emergency room at once. NOTE: This medicine is only for you. Do not share this medicine with others. What if I miss a dose? This does not apply. What may interact with this medicine? Do not take this medicine with any of the following medications: -bosentan -cidofovir -cisapride -mibefradil -ranolazine -red yeast rice, monascus purpureus -St. Kamarie's wort -tacrolimus This medicine may also interact with the following medications: -acyclovir -allopurinol -amiloride -amiodarone -bromocriptine -carbamazepine -certain antibiotics -cimetidine -colchicine -danazol -digoxin -male hormones, including contraceptive or birth control pills -imatinib -medicines for fungal infections like amphotericin B, fluconazole, itraconazole, terbinafine, and ketoconazole -medicines for blood pressure like diltiazem, nicardipine, verapamil, enalapril, ramipril, and losartan -medicines for cholesterol like lovastatin, simvastatin, atorvastatin, and fenofibrate -medicines for HIV infection like indinavir, nelfinavir, ritonavir, and saquinavir -medicines that suppress the immune system -melphalan -methotrexate -metoclopramide -NSAIDs, medicines for pain and inflammation, like ibuprofen or naproxen -octreotide -orlistat -oxcarbazepine -phenobarbital -phenytoin -ranitidine -sirolimus -spironolactone -steroid medicines like prednisone or cortisone -sulfinpyrazone -ticlopidine -triamterene -vaccines -voriconazole This list may not describe all possible interactions. Give your health care provider a list of all the medicines, herbs, non-prescription drugs, or dietary supplements you use. Also tell them if you smoke,  drink alcohol, or use illegal drugs. Some items may interact with  your medicine. What should I watch for while using this medicine? Your condition will be monitored carefully while you are receiving this medicine. You will need important blood work while you are taking this medicine. If you get a cold or other infection while taking this medicine, call your doctor or health care professional. Do not treat yourself. The medicine may decrease your body's ability to fight infections. The medicine can cause unusual growth of gum tissue and can make your gums bleed. Practice good oral hygiene, and be careful when brushing and flossing your teeth. See your dentist regularly. Women should inform their doctor if they wish to become pregnant or think they might be pregnant. There is a potential for serious side effects to an unborn child. Talk to your health care professional or pharmacist for more information. This medicine may increase your risk for certain types of skin cancer, especially if you have psoriasis. To decrease your risk, were protective clothing, including hats, and use sunscreen with a high protection factor when exposed to the sun. Avoid using tanning beds. What side effects may I notice from receiving this medicine? Side effects that you should report to your doctor or health care professional as soon as possible: -allergic reactions like skin rash, itching or hives, swelling of the face, lips, or tongue -changes in vision -high blood pressure -increased urge to urinate or frequent urination -numbness or tingling in the hands and feet -seizures -severe stomach pain -vomiting -yellowing of the eyes or skin Side effects that usually do not require medical attention (report to your doctor or health care professional if they continue or are bothersome): -bleeding or tender gums, overgrowth of gum tissue -diarrhea -excessive hair growth on the face or body -nausea -tremors This list may  not describe all possible side effects. Call your doctor for medical advice about side effects. You may report side effects to FDA at 1-800-FDA-1088. Where should I keep my medicine? This drug is given in a hospital or clinic and will not be stored at home. NOTE: This sheet is a summary. It may not cover all possible information. If you have questions about this medicine, talk to your doctor, pharmacist, or health care provider.  2015, Elsevier/Gold Standard. (2008-02-05 11:14:26)

## 2014-09-04 NOTE — Progress Notes (Signed)
Hematology and Oncology Follow Up Visit  [name] [id number] May 25, 1961 53 y.o. 09/04/2014   Principle Diagnosis:   Metastatic neuroendocrine carcinoma  Von Hipple-Lindau Syndrome  Current Therapy:   Sandostatin 30 mg IM every month     Interim History:  [name] is back for followup. Unfortunately, since she last saw him, he has been hospitalized. He was in the hospital a week or so ago. He was found to have postop to be an ileus. There may have been a volvulus.  He was having a lot of issues with blood pressure. His having more more blood pressure problems. His blood pressure has been very high. Given that he has the blood Hippel Lindau problem, the fact that he may have developed a pheochromocytoma is a possibility. He had some urine studies done. He had some serum studies done. They are equivocal with respect to whether or not there is a pheochromocytoma.  I spoke with radiology. There were did not give me a lot of advice as to how to proceed with try to make a diagnosis.  They want to go on vacation. I didn't want to go up [place] to [place] in the next week or so. They're worried that he may have a headache and blood pressure crisis. I went ahead and gave them a prescription for Cardura in case this was a problem.  His abdominal pain has been doing pretty well. He had his Foley catheter exchanged.  His last chromogranin A level was 14.  His appetite has been doing okay. His blood sugars have been on the high side. He is on insulin.  Overall, his performance status is ECOG 1. Medications: Current outpatient prescriptions:baclofen (LIORESAL) 20 MG tablet, Take 20-40 mg by mouth 5 (five) times daily., Disp: , Rfl: ;  diazepam (VALIUM) 5 MG tablet, Take 5 mg by mouth 3 (three) times daily as needed for muscle spasms., Disp: , Rfl: ;  ergocalciferol (VITAMIN D2) 50000 UNITS capsule, Take 50,000 Units by mouth once a week. On Fridays, Disp: , Rfl:    HYDROcodone-acetaminophen (NORCO/VICODIN) 5-325 MG per tablet, Take 1 tablet by mouth every 6 (six) hours as needed for moderate pain., Disp: , Rfl: ;  HYDROmorphone (DILAUDID) 4 MG tablet, Take 4 mg by mouth every 6 (six) hours as needed for severe pain., Disp: , Rfl: ;  insulin glargine (LANTUS) 100 UNIT/ML injection, Inject 25 Units into the skin daily before breakfast. , Disp: , Rfl:  insulin lispro (HUMALOG) 100 UNIT/ML injection, Inject 3-9 Units into the skin 3 (three) times daily before meals. SS, Disp: , Rfl: ;  midodrine (PROAMATINE) 5 MG tablet, Take 2 tablets (10 mg total) by mouth 2 (two) times daily as needed (Prior to ambulation)., Disp: , Rfl: ;  Pancrelipase, Lip-Prot-Amyl, 24000 UNITS CPEP, Take 3 capsules by mouth 3 (three) times daily with meals., Disp: , Rfl:  polyethylene glycol (MIRALAX / GLYCOLAX) packet, Take 17 g by mouth daily., Disp: 30 each, Rfl: 0;  promethazine (PHENERGAN) 12.5 MG tablet, Take 12.5 mg by mouth every 6 (six) hours as needed for nausea. , Disp: , Rfl: ;  tiZANidine (ZANAFLEX) 4 MG tablet, Take 4 mg by mouth as needed for muscle spasms. , Disp: , Rfl: ;  traMADol (ULTRAM) 50 MG tablet, Take 50 mg by mouth 3 (three) times daily., Disp: , Rfl:  doxazosin (CARDURA) 1 MG tablet, Take 1 tablet (1 mg total) by mouth 2 (two) times daily., Disp: 60 tablet, Rfl: 2  Allergies:  Allergies  Allergen Reactions  . Ciprocin-Fluocin-Procin [Fluocinolone Acetonide] Rash    Pt states this happened with IV Cipro. ABLE TO TAKE PO CIPRO.  . Other Rash    IV-CIPRO    Past Medical History, Surgical history, Social history, and Family History were reviewed and updated.  Review of Systems: As above  Physical Exam:  height is 5\' 9"  (1.753 m). His oral temperature is 97.8 F (36.6 C). His blood pressure is 99/69 and his pulse is 92. His respiration is 18.   Well-developed and well-nourished gentleman. He is paralyzed from the waist down. His head exam shows no ocular or  oral lesions. He has no palpable cervical or supraclavicular lymph nodes. His lungs are clear. Cardiac exam regular in rhythm with no murmurs, rubs or bruits. Abdomen is soft. Has good bowel sounds. There is no fluid wave. He has well-healed laparotomy scar is. He has no I was abdominal mass. There is no obvious liver or spleen tip. Back exam shows a laminectomy scar in the neck and upper thoracic spine. Extremities shows no swelling in his lower legs. He has no strength secondary to the paralysis. Neurological exam is nonfocal. Skin exam shows no rashes.  Lab Results  Component Value Date   WBC 7.6 09/03/2014   HGB 14.1 09/03/2014   HCT 43.2 09/03/2014   MCV 84 09/03/2014   PLT 301 09/03/2014     Chemistry      Component Value Date/Time   NA 139 09/03/2014 1037   NA 144 08/22/2014 0415   K 3.4 09/03/2014 1037   K 4.2 08/22/2014 0415   CL 97* 09/03/2014 1037   CL 106 08/22/2014 0415   CO2 30 09/03/2014 1037   CO2 27 08/22/2014 0415   BUN 14 09/03/2014 1037   BUN 11 08/22/2014 0415   CREATININE 1.0 09/03/2014 1037   CREATININE 0.82 08/22/2014 0415      Component Value Date/Time   CALCIUM 9.0 09/03/2014 1037   CALCIUM 8.8 08/22/2014 0415   ALKPHOS 151* 09/03/2014 1037   ALKPHOS 118* 08/20/2014 0412   AST 43* 09/03/2014 1037   AST 18 08/20/2014 0412   ALT 34 09/03/2014 1037   ALT 12 08/20/2014 0412   BILITOT 0.50 09/03/2014 1037   BILITOT 0.3 08/20/2014 0412         Impression and Plan: [name] is 53 year old gentleman with von Hippel-Lindau syndrome. He has multiple neuroendocrine tumors. 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 He's had multiple spinal surgeries.  It certainly is possible that he might have pheochromocytoma. He could develop 1. Do a routine CT scan is not going to help [place]. He does have one done in the hospital a couple weeks ago. His hepatic lesions really looked pretty stable to me.  We are trying to give him quality of life. This is what is  important.  I will see about doing a radioisotope scan for pheochromocytoma. There are studies in which MIBG radioisotope scans are helpful.  I may want to repeat his urine and serum studies.  We will give him the Sandostatin today.  I spent about 45 minutes with he and his wife.  I'll plan to see him back in another 4 or 5 weeks.   Volanda Napoleon, MD 9/30/20155:47 PM

## 2014-09-06 ENCOUNTER — Other Ambulatory Visit: Payer: Self-pay | Admitting: Hematology & Oncology

## 2014-09-06 LAB — CHROMOGRANIN A: Chromogranin A: 14 ng/mL (ref <=15)

## 2014-09-09 ENCOUNTER — Encounter: Payer: Self-pay | Admitting: Nurse Practitioner

## 2014-09-10 ENCOUNTER — Ambulatory Visit: Payer: 59 | Admitting: Hematology & Oncology

## 2014-09-10 ENCOUNTER — Ambulatory Visit: Payer: 59

## 2014-09-10 ENCOUNTER — Other Ambulatory Visit: Payer: 59 | Admitting: Lab

## 2014-09-16 ENCOUNTER — Other Ambulatory Visit: Payer: Self-pay | Admitting: Hematology & Oncology

## 2014-09-20 ENCOUNTER — Other Ambulatory Visit: Payer: Self-pay | Admitting: Hematology & Oncology

## 2014-09-20 ENCOUNTER — Other Ambulatory Visit: Payer: Self-pay

## 2014-10-01 ENCOUNTER — Ambulatory Visit (HOSPITAL_BASED_OUTPATIENT_CLINIC_OR_DEPARTMENT_OTHER): Payer: 59 | Admitting: Hematology & Oncology

## 2014-10-01 ENCOUNTER — Encounter: Payer: Self-pay | Admitting: Hematology & Oncology

## 2014-10-01 ENCOUNTER — Ambulatory Visit (HOSPITAL_BASED_OUTPATIENT_CLINIC_OR_DEPARTMENT_OTHER): Payer: 59

## 2014-10-01 ENCOUNTER — Other Ambulatory Visit (HOSPITAL_BASED_OUTPATIENT_CLINIC_OR_DEPARTMENT_OTHER): Payer: 59 | Admitting: Lab

## 2014-10-01 VITALS — BP 121/83 | HR 74 | Temp 98.0°F | Resp 18 | Ht 69.0 in

## 2014-10-01 DIAGNOSIS — C7A8 Other malignant neuroendocrine tumors: Secondary | ICD-10-CM

## 2014-10-01 DIAGNOSIS — E139 Other specified diabetes mellitus without complications: Secondary | ICD-10-CM

## 2014-10-01 DIAGNOSIS — E891 Postprocedural hypoinsulinemia: Secondary | ICD-10-CM

## 2014-10-01 DIAGNOSIS — I152 Hypertension secondary to endocrine disorders: Secondary | ICD-10-CM

## 2014-10-01 DIAGNOSIS — Z90410 Acquired total absence of pancreas: Secondary | ICD-10-CM

## 2014-10-01 DIAGNOSIS — C7B8 Other secondary neuroendocrine tumors: Secondary | ICD-10-CM

## 2014-10-01 DIAGNOSIS — K562 Volvulus: Secondary | ICD-10-CM

## 2014-10-01 LAB — CBC WITH DIFFERENTIAL (CANCER CENTER ONLY)
BASO#: 0 10*3/uL (ref 0.0–0.2)
BASO%: 0.7 % (ref 0.0–2.0)
EOS ABS: 0.2 10*3/uL (ref 0.0–0.5)
EOS%: 2.7 % (ref 0.0–7.0)
HCT: 45.4 % (ref 38.7–49.9)
HEMOGLOBIN: 14.6 g/dL (ref 13.0–17.1)
LYMPH#: 1.3 10*3/uL (ref 0.9–3.3)
LYMPH%: 23.2 % (ref 14.0–48.0)
MCH: 27.4 pg — AB (ref 28.0–33.4)
MCHC: 32.2 g/dL (ref 32.0–35.9)
MCV: 85 fL (ref 82–98)
MONO#: 0.5 10*3/uL (ref 0.1–0.9)
MONO%: 9 % (ref 0.0–13.0)
NEUT#: 3.6 10*3/uL (ref 1.5–6.5)
NEUT%: 64.4 % (ref 40.0–80.0)
Platelets: 268 10*3/uL (ref 145–400)
RBC: 5.32 10*6/uL (ref 4.20–5.70)
RDW: 14.9 % (ref 11.1–15.7)
WBC: 5.6 10*3/uL (ref 4.0–10.0)

## 2014-10-01 LAB — CMP (CANCER CENTER ONLY)
ALBUMIN: 3.1 g/dL — AB (ref 3.3–5.5)
ALT(SGPT): 28 U/L (ref 10–47)
AST: 33 U/L (ref 11–38)
Alkaline Phosphatase: 144 U/L — ABNORMAL HIGH (ref 26–84)
BUN: 14 mg/dL (ref 7–22)
CO2: 30 mEq/L (ref 18–33)
Calcium: 8.9 mg/dL (ref 8.0–10.3)
Chloride: 95 mEq/L — ABNORMAL LOW (ref 98–108)
Creat: 0.8 mg/dl (ref 0.6–1.2)
Glucose, Bld: 139 mg/dL — ABNORMAL HIGH (ref 73–118)
POTASSIUM: 3.9 meq/L (ref 3.3–4.7)
Sodium: 141 mEq/L (ref 128–145)
Total Bilirubin: 0.6 mg/dl (ref 0.20–1.60)
Total Protein: 7.4 g/dL (ref 6.4–8.1)

## 2014-10-01 MED ORDER — HYDROCODONE-ACETAMINOPHEN 5-325 MG PO TABS: 1.0000 | ORAL_TABLET | Freq: Four times a day (QID) | ORAL | Status: DC | PRN

## 2014-10-01 MED ORDER — OCTREOTIDE ACETATE 30 MG IM KIT
PACK | INTRAMUSCULAR | Status: AC
  Filled 2014-10-01: qty 1

## 2014-10-01 MED ORDER — OCTREOTIDE ACETATE 30 MG IM KIT
30.0000 mg | PACK | Freq: Once | INTRAMUSCULAR | Status: AC
Start: 2014-10-01 — End: 2014-10-01
  Administered 2014-10-01: 30 mg via INTRAMUSCULAR

## 2014-10-01 NOTE — Patient Instructions (Signed)

## 2014-10-02 NOTE — Progress Notes (Signed)
Hematology and Oncology Follow Up Visit  [name] [id number] Oct 14, 1961 53 y.o. 10/02/2014   Principle Diagnosis:   Metastatic neuroendocrine carcinoma  Von Hipple-Lindau Syndrome  Current Therapy:   Sandostatin 30 mg IM every month     Interim History:  [name] is back for followup. He and his wife had a good time on vacation. They drove up to [place]. They were up in [place]. He ate a lot of lobster.   He's had some issues with transient hypertension. Given his phone Hipple Lindau syndrome, this was a concern of development of a pheochromocytoma. I did give them some Cardura. This has helped with his blood pressure elevations. I will go ahead and do a 24-hour urine to check for catecholamines, metanephrines, and VMA.   He is having no abdominal pain. He had been hospitalized previously with a volvulus which resolved on its own.  He's had no problems with going to the bathroom. He has a suprapubic catheter in.  He's had no fever. He's had no nausea or vomiting. His blood sugars have been doing pretty well.   His last chromogranin A level was 14.  Overall, his performance status is ECOG 1. Medications: Current outpatient prescriptions:baclofen (LIORESAL) 20 MG tablet, Take 20-40 mg by mouth 5 (five) times daily., Disp: , Rfl: ;  diazepam (VALIUM) 5 MG tablet, TAKE 1 TABLET BY MOUTH 3 TIMES DAILY AS NEEDED FOR MUSCLE SPASMS, Disp: 40 tablet, Rfl: 1;  doxazosin (CARDURA) 1 MG tablet, Take 1 tablet (1 mg total) by mouth 2 (two) times daily., Disp: 60 tablet, Rfl: 2 ergocalciferol (VITAMIN D2) 50000 UNITS capsule, Take 50,000 Units by mouth once a week. On Fridays, Disp: , Rfl: ;  HYDROcodone-acetaminophen (NORCO/VICODIN) 5-325 MG per tablet, Take 1 tablet by mouth every 6 (six) hours as needed for moderate pain., Disp: 60 tablet, Rfl: 0;  HYDROmorphone (DILAUDID) 4 MG tablet, Take 4 mg by mouth every 6 (six) hours as needed for severe pain., Disp: , Rfl:  insulin  glargine (LANTUS) 100 UNIT/ML injection, Inject 25 Units into the skin daily before breakfast. , Disp: , Rfl: ;  insulin lispro (HUMALOG) 100 UNIT/ML injection, Inject 3-9 Units into the skin 3 (three) times daily before meals. SS, Disp: , Rfl: ;  midodrine (PROAMATINE) 5 MG tablet, Take 2 tablets (10 mg total) by mouth 2 (two) times daily as needed (Prior to ambulation)., Disp: , Rfl:  Pancrelipase, Lip-Prot-Amyl, 24000 UNITS CPEP, Take 3 capsules by mouth 3 (three) times daily with meals., Disp: , Rfl: ;  polyethylene glycol (MIRALAX / GLYCOLAX) packet, Take 17 g by mouth daily., Disp: 30 each, Rfl: 0;  promethazine (PHENERGAN) 12.5 MG tablet, Take 12.5 mg by mouth every 6 (six) hours as needed for nausea. , Disp: , Rfl: ;  tiZANidine (ZANAFLEX) 4 MG tablet, Take 4 mg by mouth as needed for muscle spasms. , Disp: , Rfl:  traMADol (ULTRAM) 50 MG tablet, Take 1 tablet (50 mg total) by mouth 3 (three) times daily., Disp: 90 tablet, Rfl: 2  Allergies:  Allergies  Allergen Reactions  . Ciprocin-Fluocin-Procin [Fluocinolone Acetonide] Rash    Pt states this happened with IV Cipro. ABLE TO TAKE PO CIPRO.  . Other Rash    IV-CIPRO    Past Medical History, Surgical history, Social history, and Family History were reviewed and updated.  Review of Systems: As above  Physical Exam:  height is 5\' 9"  (1.753 m). His oral temperature is 98 F (36.7 C).  His blood pressure is 121/83 and his pulse is 74. His respiration is 18.   Well-developed and well-nourished gentleman. He is paralyzed from the waist down. His head exam shows no ocular or oral lesions. He has no palpable cervical or supraclavicular lymph nodes. His lungs are clear. Cardiac exam regular in rhythm with no murmurs, rubs or bruits. Abdomen is soft. Has good bowel sounds. There is no fluid wave. He has well-healed laparotomy scar is. He has no I was abdominal mass. There is no obvious liver or spleen tip. Back exam shows a laminectomy scar in the  neck and upper thoracic spine. Extremities shows no swelling in his lower legs. He has no strength secondary to the paralysis. Neurological exam is nonfocal. Skin exam shows no rashes.  Lab Results  Component Value Date   WBC 5.6 10/01/2014   HGB 14.6 10/01/2014   HCT 45.4 10/01/2014   MCV 85 10/01/2014   PLT 268 10/01/2014     Chemistry      Component Value Date/Time   NA 141 10/01/2014 1020   NA 144 08/22/2014 0415   K 3.9 10/01/2014 1020   K 4.2 08/22/2014 0415   CL 95* 10/01/2014 1020   CL 106 08/22/2014 0415   CO2 30 10/01/2014 1020   CO2 27 08/22/2014 0415   BUN 14 10/01/2014 1020   BUN 11 08/22/2014 0415   CREATININE 0.8 10/01/2014 1020   CREATININE 0.82 08/22/2014 0415      Component Value Date/Time   CALCIUM 8.9 10/01/2014 1020   CALCIUM 8.8 08/22/2014 0415   ALKPHOS 144* 10/01/2014 1020   ALKPHOS 118* 08/20/2014 0412   AST 33 10/01/2014 1020   AST 18 08/20/2014 0412   ALT 28 10/01/2014 1020   ALT 12 08/20/2014 0412   BILITOT 0.60 10/01/2014 1020   BILITOT 0.3 08/20/2014 0412      Chromogranin A is 14.   Impression and Plan: [name] is 53 year old gentleman with von Hippel-Lindau syndrome. He has multiple neuroendocrine tumors. 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 He's had multiple spinal surgeries.  It certainly is possible that he might have pheochromocytoma. We will see what the 24-hour urine shows.  It is time to do his CT scan to assess for his neuroendocrine carcinomas. I will get this set up the same day that I see him in about one month.  He will give him his Sandostatin today.   We are trying to give him quality of life. This is what is important.   I spent about 45 minutes with he and his wife.  I'll plan to see him back in another 4 or 5 weeks.   Volanda Napoleon, MD 10/28/20157:28 AM

## 2014-10-07 LAB — CHROMOGRANIN A: Chromogranin A: 15 ng/mL (ref <=15)

## 2014-10-07 LAB — LACTATE DEHYDROGENASE: LDH: 153 U/L (ref 94–250)

## 2014-10-08 ENCOUNTER — Ambulatory Visit: Payer: 59 | Admitting: Hematology & Oncology

## 2014-10-08 ENCOUNTER — Other Ambulatory Visit: Payer: 59 | Admitting: Lab

## 2014-10-08 ENCOUNTER — Ambulatory Visit: Payer: 59

## 2014-10-14 ENCOUNTER — Other Ambulatory Visit: Payer: 59 | Admitting: Lab

## 2014-10-14 DIAGNOSIS — I158 Other secondary hypertension: Secondary | ICD-10-CM | POA: Diagnosis not present

## 2014-10-14 DIAGNOSIS — I152 Hypertension secondary to endocrine disorders: Secondary | ICD-10-CM

## 2014-10-18 LAB — CATECHOLAMINES, FRACTIONATED, URINE, 24 HOUR
CREATININE, URINE MG/DAY-CATEUR: 0.83 g/(24.h) (ref 0.63–2.50)
Total Volume - CF 24Hr U: 2300 mL

## 2014-10-18 LAB — METANEPHRINES, URINE, 24 HOUR
Metaneph Total, Ur: 431 mcg/24 h (ref 224–832)
Metanephrines, Ur: 67 mcg/24 h — ABNORMAL LOW (ref 90–315)
NORMETANEPHRINE 24H UR: 364 ug/(24.h) (ref 122–676)
VOLUME, URINE-METAN: 2300 mL

## 2014-10-18 LAB — VMA, URINE, 24 HOUR
CREATININE 24H URINE: 0.83 g/(24.h) (ref 0.63–2.50)
Vanillylmandelic Acid, (VMA): 3.5 mg/24 h (ref <=6.0)
Volume, Urine-VMAUR: 2300 mL

## 2014-10-22 ENCOUNTER — Telehealth: Payer: Self-pay | Admitting: *Deleted

## 2014-10-22 NOTE — Telephone Encounter (Addendum)
-----   Message from Volanda Napoleon, MD sent at 10/21/2014  5:12 PM EST ----- I left a message that the urine studies were pretty much normal for a pheochromocytoma workup. As such, I really don't think that he has a pheochromocytoma that is causing his blood pressure to elevate every now and then.[name] -Called pt to make sure that he got Dr Antonieta Pert voicemail. Pt stated he received voicemail and doesn't have questions.

## 2014-10-29 ENCOUNTER — Ambulatory Visit (HOSPITAL_BASED_OUTPATIENT_CLINIC_OR_DEPARTMENT_OTHER)
Admission: RE | Admit: 2014-10-29 | Discharge: 2014-10-29 | Disposition: A | Discharge status: Home / Self Care | Payer: 59 | Source: Ambulatory Visit | Attending: Hematology & Oncology | Admitting: Hematology & Oncology

## 2014-10-29 ENCOUNTER — Ambulatory Visit (HOSPITAL_BASED_OUTPATIENT_CLINIC_OR_DEPARTMENT_OTHER): Payer: 59

## 2014-10-29 ENCOUNTER — Ambulatory Visit (HOSPITAL_BASED_OUTPATIENT_CLINIC_OR_DEPARTMENT_OTHER): Payer: 59 | Admitting: Hematology & Oncology

## 2014-10-29 ENCOUNTER — Other Ambulatory Visit (HOSPITAL_BASED_OUTPATIENT_CLINIC_OR_DEPARTMENT_OTHER): Payer: 59 | Admitting: Lab

## 2014-10-29 ENCOUNTER — Encounter: Payer: Self-pay | Admitting: Hematology & Oncology

## 2014-10-29 ENCOUNTER — Encounter (HOSPITAL_BASED_OUTPATIENT_CLINIC_OR_DEPARTMENT_OTHER): Payer: Self-pay

## 2014-10-29 VITALS — BP 121/76 | HR 74 | Temp 97.2°F | Resp 20 | Ht 69.0 in

## 2014-10-29 DIAGNOSIS — I700 Atherosclerosis of aorta: Secondary | ICD-10-CM | POA: Insufficient documentation

## 2014-10-29 DIAGNOSIS — C7B8 Other secondary neuroendocrine tumors: Secondary | ICD-10-CM | POA: Insufficient documentation

## 2014-10-29 DIAGNOSIS — I771 Stricture of artery: Secondary | ICD-10-CM | POA: Diagnosis not present

## 2014-10-29 DIAGNOSIS — K760 Fatty (change of) liver, not elsewhere classified: Secondary | ICD-10-CM | POA: Insufficient documentation

## 2014-10-29 DIAGNOSIS — I517 Cardiomegaly: Secondary | ICD-10-CM | POA: Insufficient documentation

## 2014-10-29 DIAGNOSIS — Z905 Acquired absence of kidney: Secondary | ICD-10-CM | POA: Insufficient documentation

## 2014-10-29 DIAGNOSIS — M8580 Other specified disorders of bone density and structure, unspecified site: Secondary | ICD-10-CM | POA: Diagnosis not present

## 2014-10-29 DIAGNOSIS — M4328 Fusion of spine, sacral and sacrococcygeal region: Secondary | ICD-10-CM | POA: Insufficient documentation

## 2014-10-29 DIAGNOSIS — Z9049 Acquired absence of other specified parts of digestive tract: Secondary | ICD-10-CM | POA: Insufficient documentation

## 2014-10-29 DIAGNOSIS — N281 Cyst of kidney, acquired: Secondary | ICD-10-CM | POA: Insufficient documentation

## 2014-10-29 DIAGNOSIS — Z90411 Acquired partial absence of pancreas: Secondary | ICD-10-CM | POA: Diagnosis not present

## 2014-10-29 DIAGNOSIS — C7A8 Other malignant neuroendocrine tumors: Secondary | ICD-10-CM

## 2014-10-29 DIAGNOSIS — R599 Enlarged lymph nodes, unspecified: Secondary | ICD-10-CM | POA: Insufficient documentation

## 2014-10-29 DIAGNOSIS — R918 Other nonspecific abnormal finding of lung field: Secondary | ICD-10-CM | POA: Diagnosis not present

## 2014-10-29 DIAGNOSIS — R911 Solitary pulmonary nodule: Secondary | ICD-10-CM | POA: Diagnosis not present

## 2014-10-29 DIAGNOSIS — I10 Essential (primary) hypertension: Secondary | ICD-10-CM

## 2014-10-29 DIAGNOSIS — C787 Secondary malignant neoplasm of liver and intrahepatic bile duct: Secondary | ICD-10-CM | POA: Diagnosis not present

## 2014-10-29 DIAGNOSIS — N2889 Other specified disorders of kidney and ureter: Secondary | ICD-10-CM | POA: Insufficient documentation

## 2014-10-29 DIAGNOSIS — E119 Type 2 diabetes mellitus without complications: Secondary | ICD-10-CM | POA: Insufficient documentation

## 2014-10-29 DIAGNOSIS — Q858 Other phakomatoses, not elsewhere classified: Secondary | ICD-10-CM | POA: Insufficient documentation

## 2014-10-29 LAB — CBC WITH DIFFERENTIAL (CANCER CENTER ONLY)
BASO#: 0 10*3/uL (ref 0.0–0.2)
BASO%: 0.5 % (ref 0.0–2.0)
EOS ABS: 0.1 10*3/uL (ref 0.0–0.5)
EOS%: 1.4 % (ref 0.0–7.0)
HCT: 46 % (ref 38.7–49.9)
HEMOGLOBIN: 14.8 g/dL (ref 13.0–17.1)
LYMPH#: 1.7 10*3/uL (ref 0.9–3.3)
LYMPH%: 28.7 % (ref 14.0–48.0)
MCH: 27.7 pg — AB (ref 28.0–33.4)
MCHC: 32.2 g/dL (ref 32.0–35.9)
MCV: 86 fL (ref 82–98)
MONO#: 0.6 10*3/uL (ref 0.1–0.9)
MONO%: 10.5 % (ref 0.0–13.0)
NEUT%: 58.9 % (ref 40.0–80.0)
NEUTROS ABS: 3.5 10*3/uL (ref 1.5–6.5)
Platelets: 216 10*3/uL (ref 145–400)
RBC: 5.35 10*6/uL (ref 4.20–5.70)
RDW: 13.4 % (ref 11.1–15.7)
WBC: 5.9 10*3/uL (ref 4.0–10.0)

## 2014-10-29 LAB — CMP (CANCER CENTER ONLY)
ALBUMIN: 3.1 g/dL — AB (ref 3.3–5.5)
ALT: 31 U/L (ref 10–47)
AST: 46 U/L — ABNORMAL HIGH (ref 11–38)
Alkaline Phosphatase: 134 U/L — ABNORMAL HIGH (ref 26–84)
BUN, Bld: 16 mg/dL (ref 7–22)
CHLORIDE: 90 meq/L — AB (ref 98–108)
CO2: 30 mEq/L (ref 18–33)
Calcium: 8.9 mg/dL (ref 8.0–10.3)
Creat: 0.9 mg/dl (ref 0.6–1.2)
GLUCOSE: 168 mg/dL — AB (ref 73–118)
POTASSIUM: 4.4 meq/L (ref 3.3–4.7)
SODIUM: 140 meq/L (ref 128–145)
TOTAL PROTEIN: 7.7 g/dL (ref 6.4–8.1)
Total Bilirubin: 0.9 mg/dl (ref 0.20–1.60)

## 2014-10-29 MED ORDER — OCTREOTIDE ACETATE 30 MG IM KIT
30.0000 mg | PACK | Freq: Once | INTRAMUSCULAR | Status: AC
  Administered 2014-10-29: 30 mg via INTRAMUSCULAR

## 2014-10-29 MED ORDER — OCTREOTIDE ACETATE 30 MG IM KIT
PACK | INTRAMUSCULAR | Status: AC
  Filled 2014-10-29: qty 1

## 2014-10-29 MED ORDER — IOHEXOL 300 MG/ML  SOLN
100.0000 mL | Freq: Once | INTRAMUSCULAR | Status: AC | PRN
  Administered 2014-10-29: 100 mL via INTRAVENOUS

## 2014-10-29 NOTE — Addendum Note (Signed)
Addended by: Lenn Sink on: 10/29/2014 01:43 PM   Modules accepted: Orders

## 2014-10-29 NOTE — Patient Instructions (Signed)

## 2014-10-29 NOTE — Progress Notes (Signed)
Hematology and Oncology Follow Up Visit  [name] [id number] 1961-03-26 53 y.o. 10/29/2014   Principle Diagnosis:   Metastatic neuroendocrine carcinoma  Von Hipple-Lindau Syndrome  Current Therapy:   Sandostatin 30 mg IM every month     Interim History:  [name] is back for followup. We did the 24-hour urine studies on him area and this is for pheochromocytoma such, evaluation. Thank you, the urine studies were negative for obvious pheochromocytoma chemical production.Marland Kitchen   He's had some issues with transient hypertension. He does respond to Cardura. I don't think he is due this for a while.   We did do another CT scan on him. This was done today. The CT scan did not show any evidence of tumor progression with his hepatic disease or his renal malignancies.  He's had no problems with going to the bathroom. He has a suprapubic catheter in. He's had no issues with infection. He's had no issues with diarrhea.  He's had no fever. He's had no nausea or vomiting. His blood sugars have been doing pretty well. They do tend to fluctuate. Appetite has been doing okay.   His last chromogranin A level was 15. This is holding pretty steady.  Overall, his performance status is ECOG 1. Medications: Current outpatient prescriptions: baclofen (LIORESAL) 20 MG tablet, Take 20-40 mg by mouth 5 (five) times daily., Disp: , Rfl: ;  diazepam (VALIUM) 5 MG tablet, TAKE 1 TABLET BY MOUTH 3 TIMES DAILY AS NEEDED FOR MUSCLE SPASMS, Disp: 40 tablet, Rfl: 1;  doxazosin (CARDURA) 1 MG tablet, Take 1 tablet (1 mg total) by mouth 2 (two) times daily., Disp: 60 tablet, Rfl: 2 ergocalciferol (VITAMIN D2) 50000 UNITS capsule, Take 50,000 Units by mouth once a week. On Fridays, Disp: , Rfl: ;  HYDROcodone-acetaminophen (NORCO/VICODIN) 5-325 MG per tablet, Take 1 tablet by mouth every 6 (six) hours as needed for moderate pain., Disp: 60 tablet, Rfl: 0;  HYDROmorphone (DILAUDID) 4 MG tablet, Take 4 mg by  mouth every 6 (six) hours as needed for severe pain., Disp: , Rfl:  insulin glargine (LANTUS) 100 UNIT/ML injection, Inject 25 Units into the skin daily before breakfast. , Disp: , Rfl: ;  insulin lispro (HUMALOG) 100 UNIT/ML injection, Inject 3-9 Units into the skin 3 (three) times daily before meals. SS, Disp: , Rfl: ;  midodrine (PROAMATINE) 5 MG tablet, Take 2 tablets (10 mg total) by mouth 2 (two) times daily as needed (Prior to ambulation)., Disp: , Rfl:  Pancrelipase, Lip-Prot-Amyl, 24000 UNITS CPEP, Take 3 capsules by mouth 3 (three) times daily with meals., Disp: , Rfl: ;  polyethylene glycol (MIRALAX / GLYCOLAX) packet, Take 17 g by mouth daily., Disp: 30 each, Rfl: 0;  tiZANidine (ZANAFLEX) 4 MG tablet, Take 4 mg by mouth as needed for muscle spasms. , Disp: , Rfl: ;  traMADol (ULTRAM) 50 MG tablet, Take 1 tablet (50 mg total) by mouth 3 (three) times daily., Disp: 90 tablet, Rfl: 2 promethazine (PHENERGAN) 12.5 MG tablet, Take 12.5 mg by mouth every 6 (six) hours as needed for nausea. , Disp: , Rfl:   Allergies:  Allergies  Allergen Reactions  . Ciprocin-Fluocin-Procin [Fluocinolone Acetonide] Rash    Pt states this happened with IV Cipro. ABLE TO TAKE PO CIPRO.  . Other Rash    IV-CIPRO    Past Medical History, Surgical history, Social history, and Family History were reviewed and updated.  Review of Systems: As above  Physical Exam:  height is 5'  9" (1.753 m). His oral temperature is 97.2 F (36.2 C). His blood pressure is 121/76 and his pulse is 74. His respiration is 20.   Well-developed and well-nourished gentleman. He is paralyzed from the waist down. His head exam shows no ocular or oral lesions. He has no palpable cervical or supraclavicular lymph nodes. His lungs are clear. Cardiac exam regular rate and rhythm with no murmurs, rubs or bruits. Abdomen is soft. Has good bowel sounds. There is no fluid wave. He has a well-healed laparotomy scar is. He has no obvious abdominal  mass. There is no obvious liver or spleen tip. Back exam shows a laminectomy scar in the neck and upper thoracic spine. Extremities shows no swelling in his lower legs. He has no strength secondary to the paralysis. Neurological exam is nonfocal. Skin exam shows no rashes.  Lab Results  Component Value Date   WBC 5.9 10/29/2014   HGB 14.8 10/29/2014   HCT 46.0 10/29/2014   MCV 86 10/29/2014   PLT 216 10/29/2014     Chemistry      Component Value Date/Time   NA 140 10/29/2014 1100   NA 144 08/22/2014 0415   K 4.4 10/29/2014 1100   K 4.2 08/22/2014 0415   CL 90* 10/29/2014 1100   CL 106 08/22/2014 0415   CO2 30 10/29/2014 1100   CO2 27 08/22/2014 0415   BUN 16 10/29/2014 1100   BUN 11 08/22/2014 0415   CREATININE 0.9 10/29/2014 1100   CREATININE 0.82 08/22/2014 0415      Component Value Date/Time   CALCIUM 8.9 10/29/2014 1100   CALCIUM 8.8 08/22/2014 0415   ALKPHOS 134* 10/29/2014 1100   ALKPHOS 118* 08/20/2014 0412   AST 46* 10/29/2014 1100   AST 18 08/20/2014 0412   ALT 31 10/29/2014 1100   ALT 12 08/20/2014 0412   BILITOT 0.90 10/29/2014 1100   BILITOT 0.3 08/20/2014 0412      Chromogranin A is 14.   Impression and Plan: [name] is 53 year old gentleman with von Hippel-Lindau syndrome. He has multiple neuroendocrine tumors. 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 He's had multiple spinal surgeries.  His urine studies are pretty convincing for no pheochromocytoma. Urine studies are highly sensitive and specific for pheochromocytoma evaluation.  It is reassuring that his CT scans are pretty stable. We don't see anything that looks like aggression of his malignancies.   We will give him his Sandostatin today.   We are trying to give him quality of life. This is what is important.  I spent about 45 minutes with he and his wife.  I'll plan to see him back in another 4 or 5 weeks.   Volanda Napoleon, MD 11/24/20154:36 PM

## 2014-11-05 LAB — CHROMOGRANIN A: CHROMOGRANIN A: 14 ng/mL (ref <=15)

## 2014-11-25 ENCOUNTER — Telehealth: Payer: Self-pay | Admitting: Hematology & Oncology

## 2014-11-25 NOTE — Telephone Encounter (Signed)
Left pt message moved 12-22 to 12-29, per MD request

## 2014-11-26 ENCOUNTER — Ambulatory Visit: Payer: 59

## 2014-11-26 ENCOUNTER — Other Ambulatory Visit: Payer: 59 | Admitting: Lab

## 2014-11-26 ENCOUNTER — Ambulatory Visit: Payer: 59 | Admitting: Hematology & Oncology

## 2014-12-03 ENCOUNTER — Encounter: Payer: Self-pay | Admitting: Hematology & Oncology

## 2014-12-03 ENCOUNTER — Ambulatory Visit (HOSPITAL_BASED_OUTPATIENT_CLINIC_OR_DEPARTMENT_OTHER): Payer: 59 | Admitting: Hematology & Oncology

## 2014-12-03 ENCOUNTER — Other Ambulatory Visit (HOSPITAL_BASED_OUTPATIENT_CLINIC_OR_DEPARTMENT_OTHER): Payer: 59 | Admitting: Lab

## 2014-12-03 ENCOUNTER — Ambulatory Visit (HOSPITAL_BASED_OUTPATIENT_CLINIC_OR_DEPARTMENT_OTHER): Payer: 59

## 2014-12-03 VITALS — BP 127/88 | HR 78 | Temp 98.1°F | Resp 20 | Ht 69.0 in

## 2014-12-03 DIAGNOSIS — C7A8 Other malignant neuroendocrine tumors: Secondary | ICD-10-CM

## 2014-12-03 DIAGNOSIS — I10 Essential (primary) hypertension: Secondary | ICD-10-CM

## 2014-12-03 DIAGNOSIS — C7B8 Other secondary neuroendocrine tumors: Secondary | ICD-10-CM

## 2014-12-03 DIAGNOSIS — Q858 Other phakomatoses, not elsewhere classified: Secondary | ICD-10-CM

## 2014-12-03 LAB — CBC WITH DIFFERENTIAL (CANCER CENTER ONLY)
BASO#: 0 10*3/uL (ref 0.0–0.2)
BASO%: 0.3 % (ref 0.0–2.0)
EOS ABS: 0.1 10*3/uL (ref 0.0–0.5)
EOS%: 0.9 % (ref 0.0–7.0)
HEMATOCRIT: 44.3 % (ref 38.7–49.9)
HGB: 14.4 g/dL (ref 13.0–17.1)
LYMPH#: 1.6 10*3/uL (ref 0.9–3.3)
LYMPH%: 20.4 % (ref 14.0–48.0)
MCH: 27.7 pg — ABNORMAL LOW (ref 28.0–33.4)
MCHC: 32.5 g/dL (ref 32.0–35.9)
MCV: 85 fL (ref 82–98)
MONO#: 0.7 10*3/uL (ref 0.1–0.9)
MONO%: 8.5 % (ref 0.0–13.0)
NEUT#: 5.3 10*3/uL (ref 1.5–6.5)
NEUT%: 69.9 % (ref 40.0–80.0)
Platelets: 207 10*3/uL (ref 145–400)
RBC: 5.2 10*6/uL (ref 4.20–5.70)
RDW: 13.1 % (ref 11.1–15.7)
WBC: 7.6 10*3/uL (ref 4.0–10.0)

## 2014-12-03 LAB — CMP (CANCER CENTER ONLY)
ALT(SGPT): 23 U/L (ref 10–47)
AST: 29 U/L (ref 11–38)
Albumin: 3.1 g/dL — ABNORMAL LOW (ref 3.3–5.5)
Alkaline Phosphatase: 131 U/L — ABNORMAL HIGH (ref 26–84)
BUN: 17 mg/dL (ref 7–22)
CHLORIDE: 97 meq/L — AB (ref 98–108)
CO2: 30 meq/L (ref 18–33)
CREATININE: 0.7 mg/dL (ref 0.6–1.2)
Calcium: 9.1 mg/dL (ref 8.0–10.3)
Glucose, Bld: 225 mg/dL — ABNORMAL HIGH (ref 73–118)
POTASSIUM: 3.8 meq/L (ref 3.3–4.7)
SODIUM: 141 meq/L (ref 128–145)
Total Bilirubin: 0.6 mg/dl (ref 0.20–1.60)
Total Protein: 7.6 g/dL (ref 6.4–8.1)

## 2014-12-03 MED ORDER — OCTREOTIDE ACETATE 30 MG IM KIT
PACK | INTRAMUSCULAR | Status: AC
  Filled 2014-12-03: qty 1

## 2014-12-03 MED ORDER — OCTREOTIDE ACETATE 30 MG IM KIT
30.0000 mg | PACK | Freq: Once | INTRAMUSCULAR | Status: AC
  Administered 2014-12-03: 30 mg via INTRAMUSCULAR

## 2014-12-03 NOTE — Patient Instructions (Signed)

## 2014-12-03 NOTE — Progress Notes (Signed)
Hematology and Oncology Follow Up Visit  [name] [id number] 19-Mar-1961 53 y.o. 12/03/2014   Principle Diagnosis:   Metastatic neuroendocrine carcinoma  Von Hipple-Lindau Syndrome  Current Therapy:   Sandostatin 30 mg IM every month     Interim History:  [name] is back for followup. He is doing pretty well. He had a good Thanksgiving and Christmas.  He's had a problem with cough or shortness of breath. He's had no issues with Scott skin. He does have some rosacea.  He does have some abdominal spasms. This is chronic. The spasms have not been as bad.  He does have a chronic indwelling Foley catheter. He thinks this causes him more problems than anything else.  Scott blood sugars have been a little on the high side. He is doing a good job in trying to control these.  Scott blood pressure has been occasionally on the high side. I think he probably does have some pheochromocytoma but we discussed not find it with the urine studies. When Scott blood pressure goes up, [name] gives him some Cardura which helps quite a bit. He knows that Scott blood pressure goes up when he starts having a headache.  He's had no fever.  Scott last chromogranin A level was 14.. This is holding pretty steady.  Overall, Scott performance status is ECOG 1. Medications: Current outpatient prescriptions: baclofen (LIORESAL) 20 MG tablet, Take 20-40 mg by mouth 5 (five) times daily., Disp: , Rfl: ;  diazepam (VALIUM) 5 MG tablet, TAKE 1 TABLET BY MOUTH 3 TIMES DAILY AS NEEDED FOR MUSCLE SPASMS, Disp: 40 tablet, Rfl: 1;  doxazosin (CARDURA) 1 MG tablet, Take 1 tablet (1 mg total) by mouth 2 (two) times daily., Disp: 60 tablet, Rfl: 2 ergocalciferol (VITAMIN D2) 50000 UNITS capsule, Take 50,000 Units by mouth once a week. On Fridays, Disp: , Rfl: ;  HYDROcodone-acetaminophen (NORCO/VICODIN) 5-325 MG per tablet, Take 1 tablet by mouth every 6 (six) hours as needed for moderate pain., Disp: 60 tablet, Rfl: 0;   HYDROmorphone (DILAUDID) 4 MG tablet, Take 4 mg by mouth every 6 (six) hours as needed for severe pain., Disp: , Rfl:  insulin glargine (LANTUS) 100 UNIT/ML injection, Inject 25 Units into the skin daily before breakfast. , Disp: , Rfl: ;  insulin lispro (HUMALOG) 100 UNIT/ML injection, Inject 3-9 Units into the skin 3 (three) times daily before meals. SS, Disp: , Rfl: ;  midodrine (PROAMATINE) 5 MG tablet, Take 2 tablets (10 mg total) by mouth 2 (two) times daily as needed (Prior to ambulation)., Disp: , Rfl:  Pancrelipase, Lip-Prot-Amyl, 24000 UNITS CPEP, Take 3 capsules by mouth 3 (three) times daily with meals., Disp: , Rfl: ;  tiZANidine (ZANAFLEX) 4 MG tablet, Take 4 mg by mouth as needed for muscle spasms. , Disp: , Rfl: ;  traMADol (ULTRAM) 50 MG tablet, Take 1 tablet (50 mg total) by mouth 3 (three) times daily., Disp: 90 tablet, Rfl: 2 polyethylene glycol (MIRALAX / GLYCOLAX) packet, Take 17 g by mouth daily. (Patient not taking: Reported on 12/03/2014), Disp: 30 each, Rfl: 0;  promethazine (PHENERGAN) 12.5 MG tablet, Take 12.5 mg by mouth every 6 (six) hours as needed for nausea. , Disp: , Rfl:   Allergies:  Allergies  Allergen Reactions  . Ciprocin-Fluocin-Procin [Fluocinolone Acetonide] Rash    Pt states this happened with IV Cipro. ABLE TO TAKE PO CIPRO.  . Other Rash    IV-CIPRO    Past Medical History, Surgical history,  Social history, and Family History were reviewed and updated.  Review of Systems: As above  Physical Exam:  height is 5\' 9"  (1.753 m). Scott oral temperature is 98.1 F (36.7 C). Scott blood pressure is 127/88 and Scott pulse is 78. Scott respiration is 20.   Well-developed and well-nourished gentleman. He is paralyzed from the waist down. Scott head exam shows no ocular or oral lesions. He has no palpable cervical or supraclavicular lymph nodes. Scott lungs are clear. Cardiac exam regular rate and rhythm with no murmurs, rubs or bruits. Abdomen is soft. Has good bowel  sounds. There is no fluid wave. He has a well-healed laparotomy scar is. He has no obvious abdominal mass. There is no obvious liver or spleen tip. Back exam shows a laminectomy scar in the neck and upper thoracic spine. Extremities shows no swelling in Scott lower legs. He has no strength secondary to the paralysis. Neurological exam is nonfocal. Skin exam shows no rashes.  Lab Results  Component Value Date   WBC 7.6 12/03/2014   HGB 14.4 12/03/2014   HCT 44.3 12/03/2014   MCV 85 12/03/2014   PLT 207 12/03/2014     Chemistry      Component Value Date/Time   NA 141 12/03/2014 1158   NA 144 08/22/2014 0415   K 3.8 12/03/2014 1158   K 4.2 08/22/2014 0415   CL 97* 12/03/2014 1158   CL 106 08/22/2014 0415   CO2 30 12/03/2014 1158   CO2 27 08/22/2014 0415   BUN 17 12/03/2014 1158   BUN 11 08/22/2014 0415   CREATININE 0.7 12/03/2014 1158   CREATININE 0.82 08/22/2014 0415      Component Value Date/Time   CALCIUM 9.1 12/03/2014 1158   CALCIUM 8.8 08/22/2014 0415   ALKPHOS 131* 12/03/2014 1158   ALKPHOS 118* 08/20/2014 0412   AST 29 12/03/2014 1158   AST 18 08/20/2014 0412   ALT 23 12/03/2014 1158   ALT 12 08/20/2014 0412   BILITOT 0.60 12/03/2014 1158   BILITOT 0.3 08/20/2014 0412      Chromogranin A is 14.   Impression and Plan: [name] is 53 year old gentleman with von Hippel-Lindau syndrome. He has multiple neuroendocrine tumors. 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 He's had multiple spinal surgeries.  Scott urine studies are pretty convincing for no pheochromocytoma. Urine studies are highly sensitive and specific for pheochromocytoma evaluation.  It is reassuring that Scott CT scans are pretty stable. We don't see anything that looks like aggression of Scott malignancies.   We will give him Scott Sandostatin today.   We are trying to give him quality of life. This is what is important.  I spent about 45 minutes with he and [name].  Scott last  scans were just done in November. I don't think when to do any further scans probably until March.  I'll plan to see him back in another 4 or 5 weeks.   Volanda Napoleon, MD 12/29/20152:13 PM

## 2014-12-11 LAB — LACTATE DEHYDROGENASE: LDH: 139 U/L (ref 94–250)

## 2014-12-11 LAB — CHROMOGRANIN A: CHROMOGRANIN A: 10 ng/mL (ref <=15)

## 2014-12-25 ENCOUNTER — Other Ambulatory Visit: Payer: Self-pay | Admitting: Hematology & Oncology

## 2014-12-31 ENCOUNTER — Ambulatory Visit (HOSPITAL_BASED_OUTPATIENT_CLINIC_OR_DEPARTMENT_OTHER): Payer: 59

## 2014-12-31 ENCOUNTER — Ambulatory Visit (HOSPITAL_BASED_OUTPATIENT_CLINIC_OR_DEPARTMENT_OTHER): Payer: 59 | Admitting: Hematology & Oncology

## 2014-12-31 ENCOUNTER — Other Ambulatory Visit (HOSPITAL_BASED_OUTPATIENT_CLINIC_OR_DEPARTMENT_OTHER): Payer: 59 | Admitting: Lab

## 2014-12-31 VITALS — BP 119/85 | HR 92 | Temp 97.6°F | Resp 18 | Ht 69.0 in

## 2014-12-31 DIAGNOSIS — C7A8 Other malignant neuroendocrine tumors: Secondary | ICD-10-CM

## 2014-12-31 DIAGNOSIS — B3749 Other urogenital candidiasis: Secondary | ICD-10-CM

## 2014-12-31 DIAGNOSIS — R64 Cachexia: Secondary | ICD-10-CM

## 2014-12-31 DIAGNOSIS — C7B8 Other secondary neuroendocrine tumors: Secondary | ICD-10-CM

## 2014-12-31 LAB — CMP (CANCER CENTER ONLY)
ALBUMIN: 3.4 g/dL (ref 3.3–5.5)
ALT(SGPT): 24 U/L (ref 10–47)
AST: 38 U/L (ref 11–38)
Alkaline Phosphatase: 123 U/L — ABNORMAL HIGH (ref 26–84)
BILIRUBIN TOTAL: 0.8 mg/dL (ref 0.20–1.60)
BUN, Bld: 12 mg/dL (ref 7–22)
CO2: 31 meq/L (ref 18–33)
Calcium: 8.9 mg/dL (ref 8.0–10.3)
Chloride: 91 mEq/L — ABNORMAL LOW (ref 98–108)
Creat: 0.7 mg/dl (ref 0.6–1.2)
Glucose, Bld: 180 mg/dL — ABNORMAL HIGH (ref 73–118)
Potassium: 3.7 mEq/L (ref 3.3–4.7)
Sodium: 135 mEq/L (ref 128–145)
Total Protein: 7.4 g/dL (ref 6.4–8.1)

## 2014-12-31 LAB — CBC WITH DIFFERENTIAL (CANCER CENTER ONLY)
BASO#: 0 10*3/uL (ref 0.0–0.2)
BASO%: 0.4 % (ref 0.0–2.0)
EOS%: 1.6 % (ref 0.0–7.0)
Eosinophils Absolute: 0.1 10*3/uL (ref 0.0–0.5)
HCT: 46.3 % (ref 38.7–49.9)
HGB: 15 g/dL (ref 13.0–17.1)
LYMPH#: 1.5 10*3/uL (ref 0.9–3.3)
LYMPH%: 22.8 % (ref 14.0–48.0)
MCH: 27.3 pg — AB (ref 28.0–33.4)
MCHC: 32.4 g/dL (ref 32.0–35.9)
MCV: 84 fL (ref 82–98)
MONO#: 0.5 10*3/uL (ref 0.1–0.9)
MONO%: 7.7 % (ref 0.0–13.0)
NEUT%: 67.5 % (ref 40.0–80.0)
NEUTROS ABS: 4.6 10*3/uL (ref 1.5–6.5)
Platelets: 254 10*3/uL (ref 145–400)
RBC: 5.49 10*6/uL (ref 4.20–5.70)
RDW: 13.2 % (ref 11.1–15.7)
WBC: 6.8 10*3/uL (ref 4.0–10.0)

## 2014-12-31 MED ORDER — DRONABINOL 5 MG PO CAPS: 5.0000 mg | ORAL_CAPSULE | Freq: Two times a day (BID) | ORAL | Status: DC

## 2014-12-31 MED ORDER — OCTREOTIDE ACETATE 30 MG IM KIT
30.0000 mg | PACK | Freq: Once | INTRAMUSCULAR | Status: AC
  Administered 2014-12-31: 30 mg via INTRAMUSCULAR

## 2014-12-31 MED ORDER — DIAZEPAM 5 MG PO TABS: 5.0000 mg | ORAL_TABLET | Freq: Three times a day (TID) | ORAL | Status: DC | PRN

## 2014-12-31 MED ORDER — TRAMADOL HCL 50 MG PO TABS: 50.0000 mg | ORAL_TABLET | Freq: Three times a day (TID) | ORAL | Status: DC

## 2014-12-31 MED ORDER — FLUCONAZOLE 100 MG PO TABS: 100.0000 mg | ORAL_TABLET | Freq: Every day | ORAL | Status: DC

## 2014-12-31 NOTE — Patient Instructions (Signed)

## 2015-01-01 NOTE — Progress Notes (Signed)
Hematology and Oncology Follow Up Visit  Sidi Dzikowski [id number] 1961/07/16 54 y.o. 01/01/2015   Principle Diagnosis:   Metastatic neuroendocrine carcinoma  Von Hipple-Lindau Syndrome  Current Therapy:   Sandostatin 30 mg IM every month     Interim History:  Mr.  Paullin is back for followup. He is doing pretty well. He had a good Thanksgiving and Christmas.  He's had a problem with cough or shortness of breath. He's had no issues with his skin. He does have some rosacea.  He does have some abdominal spasms. This is chronic. The spasms have not been as bad.  He does have a chronic indwelling Foley catheter. He's had some increased sediment. He thinks he may have a urine infection. I have given him Diflucan in the past.   His blood sugars have been a little on the high side. He is doing a good job in trying to control these.  His blood pressure has been occasionally on the high side. I think he probably does have some pheochromocytoma but we just can not find it with the urine studies. When his blood pressure goes up, his wife gives him some Cardura which helps quite a bit. He knows that his blood pressure goes up when he starts having a headache.  He's had no fever.  His last chromogranin A level was 10`.. This is holding pretty steady.  Overall, his performance status is ECOG 1. Medications:  Current outpatient prescriptions:  .  baclofen (LIORESAL) 20 MG tablet, Take 20-40 mg by mouth 5 (five) times daily., Disp: , Rfl:  .  diazepam (VALIUM) 5 MG tablet, Take 1 tablet (5 mg total) by mouth 3 (three) times daily as needed for anxiety or muscle spasms., Disp: 40 tablet, Rfl: 1 .  doxazosin (CARDURA) 1 MG tablet, Take 1 tablet (1 mg total) by mouth 2 (two) times daily., Disp: 60 tablet, Rfl: 2 .  ergocalciferol (VITAMIN D2) 50000 UNITS capsule, Take 50,000 Units by mouth once a week. On Fridays, Disp: , Rfl:  .  HYDROcodone-acetaminophen (NORCO/VICODIN) 5-325 MG per tablet,  Take 1 tablet by mouth every 6 (six) hours as needed for moderate pain., Disp: 60 tablet, Rfl: 0 .  HYDROmorphone (DILAUDID) 4 MG tablet, Take 4 mg by mouth every 6 (six) hours as needed for severe pain., Disp: , Rfl:  .  insulin glargine (LANTUS) 100 UNIT/ML injection, Inject 25 Units into the skin daily before breakfast. , Disp: , Rfl:  .  insulin lispro (HUMALOG) 100 UNIT/ML injection, Inject 3-9 Units into the skin 3 (three) times daily before meals. SS, Disp: , Rfl:  .  midodrine (PROAMATINE) 5 MG tablet, Take 2 tablets (10 mg total) by mouth 2 (two) times daily as needed (Prior to ambulation)., Disp: , Rfl:  .  Pancrelipase, Lip-Prot-Amyl, 24000 UNITS CPEP, Take 3 capsules by mouth 3 (three) times daily with meals., Disp: , Rfl:  .  polyethylene glycol (MIRALAX / GLYCOLAX) packet, Take 17 g by mouth daily., Disp: 30 each, Rfl: 0 .  promethazine (PHENERGAN) 12.5 MG tablet, Take 12.5 mg by mouth every 6 (six) hours as needed for nausea. , Disp: , Rfl:  .  tiZANidine (ZANAFLEX) 4 MG tablet, TAKE 2 TABLETS BY MOUTH EVERY 6 HOURS AS NEEDED FOR PAIN, Disp: 30 tablet, Rfl: 3 .  traMADol (ULTRAM) 50 MG tablet, Take 1 tablet (50 mg total) by mouth 3 (three) times daily., Disp: 90 tablet, Rfl: 2 .  dronabinol (MARINOL) 5 MG capsule, Take  1 capsule (5 mg total) by mouth 2 (two) times daily before a meal., Disp: 60 capsule, Rfl: 0 .  fluconazole (DIFLUCAN) 100 MG tablet, Take 1 tablet (100 mg total) by mouth daily., Disp: 30 tablet, Rfl: 2  Allergies:  Allergies  Allergen Reactions  . Ciprocin-Fluocin-Procin [Fluocinolone Acetonide] Rash    Pt states this happened with IV Cipro. ABLE TO TAKE PO CIPRO.  . Other Rash    IV-CIPRO    Past Medical History, Surgical history, Social history, and Family History were reviewed and updated.  Review of Systems: As above  Physical Exam:  height is 5\' 9"  (1.753 m). His oral temperature is 97.6 F (36.4 C). His blood pressure is 119/85 and his pulse is 92.  His respiration is 18.   Well-developed and well-nourished gentleman. He is paralyzed from the waist down. His head exam shows no ocular or oral lesions. He has no palpable cervical or supraclavicular lymph nodes. His lungs are clear. Cardiac exam regular rate and rhythm with no murmurs, rubs or bruits. Abdomen is soft. Has good bowel sounds. There is no fluid wave. He has a well-healed laparotomy scar is. He has no obvious abdominal mass. There is no obvious liver or spleen tip. Back exam shows a laminectomy scar in the neck and upper thoracic spine. Extremities shows no swelling in his lower legs. He has no strength secondary to the paralysis. Neurological exam is nonfocal. Skin exam shows no rashes.  Lab Results  Component Value Date   WBC 6.8 12/31/2014   HGB 15.0 12/31/2014   HCT 46.3 12/31/2014   MCV 84 12/31/2014   PLT 254 12/31/2014     Chemistry      Component Value Date/Time   NA 135 12/31/2014 0853   NA 144 08/22/2014 0415   K 3.7 12/31/2014 0853   K 4.2 08/22/2014 0415   CL 91* 12/31/2014 0853   CL 106 08/22/2014 0415   CO2 31 12/31/2014 0853   CO2 27 08/22/2014 0415   BUN 12 12/31/2014 0853   BUN 11 08/22/2014 0415   CREATININE 0.7 12/31/2014 0853   CREATININE 0.82 08/22/2014 0415      Component Value Date/Time   CALCIUM 8.9 12/31/2014 0853   CALCIUM 8.8 08/22/2014 0415   ALKPHOS 123* 12/31/2014 0853   ALKPHOS 118* 08/20/2014 0412   AST 38 12/31/2014 0853   AST 18 08/20/2014 0412   ALT 24 12/31/2014 0853   ALT 12 08/20/2014 0412   BILITOT 0.80 12/31/2014 0853   BILITOT 0.3 08/20/2014 0412         Impression and Plan: Mr. Corning is 54 year old gentleman with von Hippel-Lindau syndrome. He has multiple neuroendocrine tumors. 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 He's had multiple spinal surgeries.  We will go ahead and give him set up with no set of scans next, see him. His last set of scans were done back in November 2015.  His  wife says that he is having some more issues with spasms or possibly seizures with his right arm. They will wait till he go back up to [place] in March where he has a bunch of scans done.  From my point, I think he is doing pretty well. I did go ahead and give him a prescription for Diflucan for the chronic indwelling Foley and possible candida UTI.  Also given a prescription for Marinol to help with his appetite.  I will plan to get him back in one month.  Volanda Napoleon, MD 1/27/20166:28 AM

## 2015-01-02 ENCOUNTER — Telehealth: Payer: Self-pay | Admitting: Hematology & Oncology

## 2015-01-02 NOTE — Telephone Encounter (Signed)
Left pt with 2-24 scan appointment and to call for details

## 2015-01-03 LAB — CHROMOGRANIN A: CHROMOGRANIN A: 18 ng/mL — AB (ref <=15)

## 2015-01-07 ENCOUNTER — Other Ambulatory Visit: Payer: Self-pay | Admitting: Family

## 2015-01-28 ENCOUNTER — Ambulatory Visit: Payer: Medicare Other | Admitting: Hematology & Oncology

## 2015-01-28 ENCOUNTER — Ambulatory Visit: Payer: Medicare Other

## 2015-01-28 ENCOUNTER — Other Ambulatory Visit: Payer: Medicare Other | Admitting: Lab

## 2015-01-29 ENCOUNTER — Ambulatory Visit (HOSPITAL_BASED_OUTPATIENT_CLINIC_OR_DEPARTMENT_OTHER)
Admission: RE | Admit: 2015-01-29 | Discharge: 2015-01-29 | Disposition: A | Discharge status: Home / Self Care | Payer: 59 | Source: Ambulatory Visit | Attending: Hematology & Oncology | Admitting: Hematology & Oncology

## 2015-01-29 ENCOUNTER — Encounter: Payer: Self-pay | Admitting: Hematology & Oncology

## 2015-01-29 ENCOUNTER — Ambulatory Visit (HOSPITAL_BASED_OUTPATIENT_CLINIC_OR_DEPARTMENT_OTHER): Payer: 59

## 2015-01-29 ENCOUNTER — Ambulatory Visit (HOSPITAL_BASED_OUTPATIENT_CLINIC_OR_DEPARTMENT_OTHER): Payer: 59 | Admitting: Hematology & Oncology

## 2015-01-29 ENCOUNTER — Ambulatory Visit (HOSPITAL_BASED_OUTPATIENT_CLINIC_OR_DEPARTMENT_OTHER): Payer: 59 | Admitting: Lab

## 2015-01-29 VITALS — BP 123/81 | HR 86 | Temp 97.6°F | Resp 20 | Ht 69.0 in

## 2015-01-29 DIAGNOSIS — B3749 Other urogenital candidiasis: Secondary | ICD-10-CM

## 2015-01-29 DIAGNOSIS — R64 Cachexia: Secondary | ICD-10-CM

## 2015-01-29 DIAGNOSIS — C7A8 Other malignant neuroendocrine tumors: Secondary | ICD-10-CM

## 2015-01-29 DIAGNOSIS — C7989 Secondary malignant neoplasm of other specified sites: Secondary | ICD-10-CM | POA: Diagnosis not present

## 2015-01-29 DIAGNOSIS — C787 Secondary malignant neoplasm of liver and intrahepatic bile duct: Secondary | ICD-10-CM | POA: Diagnosis not present

## 2015-01-29 DIAGNOSIS — C7A00 Malignant carcinoid tumor of unspecified site: Secondary | ICD-10-CM | POA: Diagnosis not present

## 2015-01-29 DIAGNOSIS — N319 Neuromuscular dysfunction of bladder, unspecified: Secondary | ICD-10-CM

## 2015-01-29 DIAGNOSIS — C7B8 Other secondary neuroendocrine tumors: Secondary | ICD-10-CM

## 2015-01-29 LAB — CBC WITH DIFFERENTIAL (CANCER CENTER ONLY)
BASO#: 0 10*3/uL (ref 0.0–0.2)
BASO%: 0.3 % (ref 0.0–2.0)
EOS ABS: 0.1 10*3/uL (ref 0.0–0.5)
EOS%: 1.7 % (ref 0.0–7.0)
HCT: 46.4 % (ref 38.7–49.9)
HGB: 14.9 g/dL (ref 13.0–17.1)
LYMPH#: 1.3 10*3/uL (ref 0.9–3.3)
LYMPH%: 22.2 % (ref 14.0–48.0)
MCH: 26.9 pg — AB (ref 28.0–33.4)
MCHC: 32.1 g/dL (ref 32.0–35.9)
MCV: 84 fL (ref 82–98)
MONO#: 0.5 10*3/uL (ref 0.1–0.9)
MONO%: 8.8 % (ref 0.0–13.0)
NEUT#: 4 10*3/uL (ref 1.5–6.5)
NEUT%: 67 % (ref 40.0–80.0)
PLATELETS: 227 10*3/uL (ref 145–400)
RBC: 5.54 10*6/uL (ref 4.20–5.70)
RDW: 13.8 % (ref 11.1–15.7)
WBC: 6 10*3/uL (ref 4.0–10.0)

## 2015-01-29 LAB — CMP (CANCER CENTER ONLY)
ALK PHOS: 112 U/L — AB (ref 26–84)
ALT(SGPT): 16 U/L (ref 10–47)
AST: 24 U/L (ref 11–38)
Albumin: 3.3 g/dL (ref 3.3–5.5)
BUN: 18 mg/dL (ref 7–22)
CO2: 31 mEq/L (ref 18–33)
Calcium: 8.7 mg/dL (ref 8.0–10.3)
Chloride: 95 mEq/L — ABNORMAL LOW (ref 98–108)
Creat: 0.7 mg/dl (ref 0.6–1.2)
Glucose, Bld: 96 mg/dL (ref 73–118)
Potassium: 4 mEq/L (ref 3.3–4.7)
Sodium: 140 mEq/L (ref 128–145)
Total Bilirubin: 0.7 mg/dl (ref 0.20–1.60)
Total Protein: 7.5 g/dL (ref 6.4–8.1)

## 2015-01-29 MED ORDER — IOHEXOL 300 MG/ML  SOLN
100.0000 mL | Freq: Once | INTRAMUSCULAR | Status: AC | PRN
  Administered 2015-01-29: 100 mL via INTRAVENOUS

## 2015-01-29 MED ORDER — DRONABINOL 5 MG PO CAPS: 5.0000 mg | ORAL_CAPSULE | Freq: Two times a day (BID) | ORAL | Status: DC

## 2015-01-29 MED ORDER — OCTREOTIDE ACETATE 30 MG IM KIT
PACK | INTRAMUSCULAR | Status: AC
  Filled 2015-01-29: qty 1

## 2015-01-29 MED ORDER — LANREOTIDE ACETATE 120 MG/0.5ML ~~LOC~~ SOLN
120.0000 mg | Freq: Once | SUBCUTANEOUS | Status: AC
  Administered 2015-01-29: 120 mg via SUBCUTANEOUS
  Filled 2015-01-29: qty 120

## 2015-01-29 NOTE — Patient Instructions (Signed)
Lanreotide injection What is this medicine? LANREOTIDE (lan REE oh tide) is used to reduce blood levels of growth hormone in patients with a condition called acromegaly. It also works to slow or stop tumor growth in patients with gastroenteropancreatic neuroendocrine tumor (GEP-NET). This medicine may be used for other purposes; ask your health care provider or pharmacist if you have questions. COMMON BRAND NAME(S): Somatuline [place] What should I tell my health care provider before I take this medicine? They need to know if you have any of these conditions: -diabetes -gallbladder disease -heart disease -kidney disease -liver disease -an unusual or allergic reaction to lanreotide, other medicines, latex, foods, dyes, or preservatives -pregnant or trying to get pregnant -breast-feeding How should I use this medicine? This medicine is for injection under the skin. It is given by a health care professional in a hospital or clinic setting. Contact your pediatrician or health care professional regarding the use of this medicine in children. Special care may be needed. Overdosage: If you think you have taken too much of this medicine contact a poison control center or emergency room at once. NOTE: This medicine is only for you. Do not share this medicine with others. What if I miss a dose? It is important not to miss your dose. Call your doctor or health care professional if you are unable to keep an appointment. What may interact with this medicine? -bromocriptine -cyclosporine -medicines for diabetes, including insulin -medicines for heart disease or hypertension -quinidine This list may not describe all possible interactions. Give your health care provider a list of all the medicines, herbs, non-prescription drugs, or dietary supplements you use. Also tell them if you smoke, drink alcohol, or use illegal drugs. Some items may interact with your medicine. What should I watch for while using  this medicine? Visit your doctor or health care professional for regular checks on your progress. Your condition will be monitored carefully while you are receiving this medicine. This medicine may cause increases or decreases in blood sugar. Signs of high blood sugar include frequent urination, unusual thirst, flushed or dry skin, difficulty breathing, drowsiness, stomach ache, nausea, vomiting or dry mouth. Signs of low blood sugar include chills, cool, pale skin or cold sweats, drowsiness, extreme hunger, fast heartbeat, headache, nausea, nervousness or anxiety, shakiness, trembling, unsteadiness, tiredness, or weakness. Contact your doctor or health care professional right away if you experience any of these symptoms. What side effects may I notice from receiving this medicine? Side effects that you should report to your doctor or health care professional as soon as possible: -allergic reactions like skin rash, itching or hives, swelling of the face, lips, or tongue -changes in blood sugar -changes in heart rate -severe stomach pain Side effects that usually do not require medical attention (report to your doctor or health care professional if they continue or are bothersome): -diarrhea or constipation -gas or stomach pain -nausea, vomiting -pain, redness, swelling and irritation at site where injected This list may not describe all possible side effects. Call your doctor for medical advice about side effects. You may report side effects to FDA at 1-800-FDA-1088. Where should I keep my medicine? This drug is given in a hospital or clinic and will not be stored at home. NOTE: This sheet is a summary. It may not cover all possible information. If you have questions about this medicine, talk to your doctor, pharmacist, or health care provider.  2015, Elsevier/Gold Standard. (2013-11-21 17:43:04)

## 2015-01-29 NOTE — Progress Notes (Signed)
Hematology and Oncology Follow Up Visit  [name] [id number] March 03, 1961 54 y.o. 01/29/2015   Principle Diagnosis:   Metastatic neuroendocrine carcinoma  Von Hipple-Lindau Syndrome  Current Therapy:   Somatuline 120mg  SQ q month     Interim History:  [name] is back for followup. He is doing pretty well. Unfortunately, Scott CT scan shows that he has progressive disease. The progression is in Scott liver. Outside of the liver, everything looks very stable.  He has already had a couple rounds of intrahepatic radioisotope therapy. I don't think he is a candidate for any further therapy. Scott last procedure for this was back in May 2014.  [name] was not too surprised by the fact that he was progressing. She just noticed some subtle changes in him.  Scott last chromogranin a level was 18. This is a little more elevated for him.  Scott blood sugars have been doing pretty good. He's had no problems with Scott blood pressure. He's had some spasms in the abdomen, which likely is from Scott indwelling bladder catheter.   He's had no fever. He's had no cough. There's been no shortness of breath. Scott last chromogranin A level was 10`.. This is holding pretty steady.  Overall, Scott performance status is ECOG 1. Medications:  Current outpatient prescriptions:  .  baclofen (LIORESAL) 20 MG tablet, Take 20-40 mg by mouth 5 (five) times daily., Disp: , Rfl:  .  diazepam (VALIUM) 5 MG tablet, Take 1 tablet (5 mg total) by mouth 3 (three) times daily as needed for anxiety or muscle spasms., Disp: 40 tablet, Rfl: 1 .  doxazosin (CARDURA) 1 MG tablet, Take 1 tablet (1 mg total) by mouth 2 (two) times daily., Disp: 60 tablet, Rfl: 2 .  dronabinol (MARINOL) 5 MG capsule, Take 1 capsule (5 mg total) by mouth 2 (two) times daily before a meal., Disp: 60 capsule, Rfl: 0 .  ergocalciferol (VITAMIN D2) 50000 UNITS capsule, Take 50,000 Units by mouth once a week. On Fridays, Disp: , Rfl:  .  fluconazole  (DIFLUCAN) 100 MG tablet, Take 1 tablet (100 mg total) by mouth daily., Disp: 30 tablet, Rfl: 2 .  HYDROcodone-acetaminophen (NORCO/VICODIN) 5-325 MG per tablet, Take 1 tablet by mouth every 6 (six) hours as needed for moderate pain., Disp: 60 tablet, Rfl: 0 .  HYDROmorphone (DILAUDID) 4 MG tablet, Take 4 mg by mouth every 6 (six) hours as needed for severe pain., Disp: , Rfl:  .  insulin glargine (LANTUS) 100 UNIT/ML injection, Inject 25 Units into the skin daily before breakfast. , Disp: , Rfl:  .  insulin lispro (HUMALOG) 100 UNIT/ML injection, Inject 3-9 Units into the skin 3 (three) times daily before meals. SS, Disp: , Rfl:  .  midodrine (PROAMATINE) 5 MG tablet, Take 2 tablets (10 mg total) by mouth 2 (two) times daily as needed (Prior to ambulation)., Disp: , Rfl:  .  Pancrelipase, Lip-Prot-Amyl, 24000 UNITS CPEP, Take 3 capsules by mouth 3 (three) times daily with meals., Disp: , Rfl:  .  polyethylene glycol (MIRALAX / GLYCOLAX) packet, Take 17 g by mouth daily., Disp: 30 each, Rfl: 0 .  promethazine (PHENERGAN) 12.5 MG tablet, Take 12.5 mg by mouth every 6 (six) hours as needed for nausea. , Disp: , Rfl:  .  tiZANidine (ZANAFLEX) 4 MG tablet, TAKE 2 TABLETS BY MOUTH EVERY 6 HOURS AS NEEDED FOR PAIN, Disp: 30 tablet, Rfl: 3 .  traMADol (ULTRAM) 50 MG tablet, Take 1 tablet (  50 mg total) by mouth 3 (three) times daily., Disp: 90 tablet, Rfl: 2  Allergies:  Allergies  Allergen Reactions  . Ciprocin-Fluocin-Procin [Fluocinolone Acetonide] Rash    Pt states this happened with IV Cipro. ABLE TO TAKE PO CIPRO.  . Other Rash    IV-CIPRO    Past Medical History, Surgical history, Social history, and Family History were reviewed and updated.  Review of Systems: As above  Physical Exam:  height is 5\' 9"  (1.753 m). Scott oral temperature is 97.6 F (36.4 C). Scott blood pressure is 123/81 and Scott pulse is 86. Scott respiration is 20.   Well-developed and well-nourished gentleman. He is paralyzed  from the waist down. Scott head exam shows no ocular or oral lesions. He has no palpable cervical or supraclavicular lymph nodes. Scott lungs are clear. Cardiac exam regular rate and rhythm with no murmurs, rubs or bruits. Abdomen is soft. Has good bowel sounds. There is no fluid wave. He has a well-healed laparotomy scar is. He has no obvious abdominal mass. There is no obvious liver or spleen tip. Back exam shows a laminectomy scar in the neck and upper thoracic spine. Extremities shows no swelling in Scott lower legs. He has no strength secondary to the paralysis. Neurological exam is nonfocal. Skin exam shows no rashes.  Lab Results  Component Value Date   WBC 6.0 01/29/2015   HGB 14.9 01/29/2015   HCT 46.4 01/29/2015   MCV 84 01/29/2015   PLT 227 01/29/2015     Chemistry      Component Value Date/Time   NA 140 01/29/2015 1004   NA 144 08/22/2014 0415   K 4.0 01/29/2015 1004   K 4.2 08/22/2014 0415   CL 95* 01/29/2015 1004   CL 106 08/22/2014 0415   CO2 31 01/29/2015 1004   CO2 27 08/22/2014 0415   BUN 18 01/29/2015 1004   BUN 11 08/22/2014 0415   CREATININE 0.7 01/29/2015 1004   CREATININE 0.82 08/22/2014 0415      Component Value Date/Time   CALCIUM 8.7 01/29/2015 1004   CALCIUM 8.8 08/22/2014 0415   ALKPHOS 112* 01/29/2015 1004   ALKPHOS 118* 08/20/2014 0412   AST 24 01/29/2015 1004   AST 18 08/20/2014 0412   ALT 16 01/29/2015 1004   ALT 12 08/20/2014 0412   BILITOT 0.70 01/29/2015 1004   BILITOT 0.3 08/20/2014 0412         Impression and Plan: [name] is 54 year old gentleman with von Hippel-Lindau syndrome. He has multiple neuroendocrine tumors. 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 He's had multiple spinal surgeries.  This is a tough situation. I really do not expect him to have progressive disease.  Again, I don't think that he would get be a candidate for any further intrahepatic radiotherapy. However, I will call interventional  radiology about this.  I suppose that another option for him might be chemoembolization.  I don't know if he would be a candidate for anything else.  I know that the FDA approved a new intrahepatic octreotide-based therapy.  I would hate to have to use systemic therapy on him. We have had systemic therapy in the past. He was on Temodar/Xeloda. I know that the FDA approved Afinitor and Sutent for neuroendocrine malignancies.  I spent about 45 minutes with he and [name]. Again, I was not expecting him to have progressive disease but yet we have to try to deal with this.  I will switch Scott  somatostatin analog to Somatuline. This may offer some benefit.  I want to get back in 1 month.  Volanda Napoleon, MD 2/24/20165:36 PM

## 2015-02-01 LAB — CHROMOGRANIN A: Chromogranin A: 17 ng/mL — ABNORMAL HIGH (ref <=15)

## 2015-02-05 ENCOUNTER — Telehealth: Payer: Self-pay | Admitting: Hematology & Oncology

## 2015-02-05 NOTE — Telephone Encounter (Signed)
Pt's wife Wendi sent me an email saying: If you can fax them, that would be awesome.  The department fax is [phone], or you can fax them directly to [employer] [phone] attention Sueanne Margarita.    Either way is good with me.    Thank you!!   Faxed [employer] 02/18/2015-02/17/2016 to:  F: [phone] P: [phone]  Ref: [id number]   COPY SCANNED    REF: [id number]    COPY SCANNED

## 2015-02-07 ENCOUNTER — Telehealth: Payer: Self-pay | Admitting: Hematology & Oncology

## 2015-02-07 NOTE — Telephone Encounter (Signed)
Pt moved 3-22 to 3-29 he is going to [facility]

## 2015-02-07 NOTE — Telephone Encounter (Signed)
Pt left message needs to move 3-33 appointment. I left him message to call.

## 2015-02-17 ENCOUNTER — Other Ambulatory Visit: Payer: 59 | Admitting: Lab

## 2015-02-17 ENCOUNTER — Other Ambulatory Visit: Payer: Self-pay | Admitting: *Deleted

## 2015-02-17 DIAGNOSIS — N390 Urinary tract infection, site not specified: Secondary | ICD-10-CM

## 2015-02-17 MED ORDER — AMOXICILLIN-POT CLAVULANATE 875-125 MG PO TABS
1.0000 | ORAL_TABLET | Freq: Two times a day (BID) | ORAL | Status: DC
Start: 2015-02-17 — End: 2015-03-04

## 2015-02-20 ENCOUNTER — Other Ambulatory Visit: Payer: Self-pay | Admitting: *Deleted

## 2015-02-20 LAB — URINE CULTURE

## 2015-02-20 MED ORDER — SULFAMETHOXAZOLE-TRIMETHOPRIM 800-160 MG PO TABS: 1.0000 | ORAL_TABLET | Freq: Two times a day (BID) | ORAL | Status: DC

## 2015-02-25 ENCOUNTER — Ambulatory Visit: Payer: Medicare Other

## 2015-02-25 ENCOUNTER — Ambulatory Visit: Payer: Medicare Other | Admitting: Hematology & Oncology

## 2015-02-25 ENCOUNTER — Other Ambulatory Visit: Payer: Medicare Other | Admitting: Lab

## 2015-02-25 DIAGNOSIS — E108 Type 1 diabetes mellitus with unspecified complications: Secondary | ICD-10-CM | POA: Diagnosis not present

## 2015-02-25 DIAGNOSIS — H35371 Puckering of macula, right eye: Secondary | ICD-10-CM | POA: Diagnosis not present

## 2015-02-27 ENCOUNTER — Other Ambulatory Visit: Payer: Medicare Other | Admitting: Lab

## 2015-02-27 ENCOUNTER — Ambulatory Visit: Payer: Medicare Other

## 2015-02-27 ENCOUNTER — Ambulatory Visit: Payer: Medicare Other | Admitting: Hematology & Oncology

## 2015-03-04 ENCOUNTER — Ambulatory Visit (HOSPITAL_BASED_OUTPATIENT_CLINIC_OR_DEPARTMENT_OTHER): Payer: 59 | Admitting: Hematology & Oncology

## 2015-03-04 ENCOUNTER — Other Ambulatory Visit (HOSPITAL_BASED_OUTPATIENT_CLINIC_OR_DEPARTMENT_OTHER): Payer: 59

## 2015-03-04 ENCOUNTER — Encounter: Payer: Self-pay | Admitting: Hematology & Oncology

## 2015-03-04 ENCOUNTER — Ambulatory Visit (HOSPITAL_BASED_OUTPATIENT_CLINIC_OR_DEPARTMENT_OTHER): Payer: 59

## 2015-03-04 VITALS — BP 103/68 | HR 63 | Temp 97.4°F | Resp 16

## 2015-03-04 DIAGNOSIS — G839 Paralytic syndrome, unspecified: Secondary | ICD-10-CM

## 2015-03-04 DIAGNOSIS — Q858 Other phakomatoses, not elsewhere classified: Secondary | ICD-10-CM

## 2015-03-04 DIAGNOSIS — C7A8 Other malignant neuroendocrine tumors: Secondary | ICD-10-CM

## 2015-03-04 DIAGNOSIS — C7B8 Other secondary neuroendocrine tumors: Secondary | ICD-10-CM

## 2015-03-04 DIAGNOSIS — B3749 Other urogenital candidiasis: Secondary | ICD-10-CM

## 2015-03-04 DIAGNOSIS — R64 Cachexia: Secondary | ICD-10-CM

## 2015-03-04 LAB — CBC WITH DIFFERENTIAL (CANCER CENTER ONLY)
BASO#: 0 10*3/uL (ref 0.0–0.2)
BASO%: 0.4 % (ref 0.0–2.0)
EOS ABS: 0.1 10*3/uL (ref 0.0–0.5)
EOS%: 1.9 % (ref 0.0–7.0)
HCT: 45.9 % (ref 38.7–49.9)
HEMOGLOBIN: 14.8 g/dL (ref 13.0–17.1)
LYMPH#: 1.6 10*3/uL (ref 0.9–3.3)
LYMPH%: 30 % (ref 14.0–48.0)
MCH: 27.3 pg — AB (ref 28.0–33.4)
MCHC: 32.2 g/dL (ref 32.0–35.9)
MCV: 85 fL (ref 82–98)
MONO#: 0.6 10*3/uL (ref 0.1–0.9)
MONO%: 11.1 % (ref 0.0–13.0)
NEUT#: 3 10*3/uL (ref 1.5–6.5)
NEUT%: 56.6 % (ref 40.0–80.0)
Platelets: 265 10*3/uL (ref 145–400)
RBC: 5.43 10*6/uL (ref 4.20–5.70)
RDW: 14 % (ref 11.1–15.7)
WBC: 5.3 10*3/uL (ref 4.0–10.0)

## 2015-03-04 LAB — CMP (CANCER CENTER ONLY)
ALT(SGPT): 21 U/L (ref 10–47)
AST: 30 U/L (ref 11–38)
Albumin: 3.1 g/dL — ABNORMAL LOW (ref 3.3–5.5)
Alkaline Phosphatase: 120 U/L — ABNORMAL HIGH (ref 26–84)
BUN, Bld: 19 mg/dL (ref 7–22)
CALCIUM: 8.9 mg/dL (ref 8.0–10.3)
CO2: 31 mEq/L (ref 18–33)
CREATININE: 0.9 mg/dL (ref 0.6–1.2)
Chloride: 98 mEq/L (ref 98–108)
Glucose, Bld: 146 mg/dL — ABNORMAL HIGH (ref 73–118)
Potassium: 3.4 mEq/L (ref 3.3–4.7)
Sodium: 142 mEq/L (ref 128–145)
TOTAL PROTEIN: 7.5 g/dL (ref 6.4–8.1)
Total Bilirubin: 0.7 mg/dl (ref 0.20–1.60)

## 2015-03-04 MED ORDER — SUNITINIB MALATE 12.5 MG PO CAPS: 37.5000 mg | ORAL_CAPSULE | Freq: Every day | ORAL | Status: DC

## 2015-03-04 MED ORDER — DRONABINOL 5 MG PO CAPS: 5.0000 mg | ORAL_CAPSULE | Freq: Two times a day (BID) | ORAL | Status: DC

## 2015-03-04 MED ORDER — LANREOTIDE ACETATE 120 MG/0.5ML ~~LOC~~ SOLN
120.0000 mg | Freq: Once | SUBCUTANEOUS | Status: AC
  Administered 2015-03-04: 120 mg via SUBCUTANEOUS
  Filled 2015-03-04: qty 120

## 2015-03-04 NOTE — Patient Instructions (Signed)
Lanreotide injection What is this medicine? LANREOTIDE (lan REE oh tide) is used to reduce blood levels of growth hormone in patients with a condition called acromegaly. It also works to slow or stop tumor growth in patients with gastroenteropancreatic neuroendocrine tumor (GEP-NET). This medicine may be used for other purposes; ask your health care provider or pharmacist if you have questions. COMMON BRAND NAME(S): Somatuline [place] What should I tell my health care provider before I take this medicine? They need to know if you have any of these conditions: -diabetes -gallbladder disease -heart disease -kidney disease -liver disease -an unusual or allergic reaction to lanreotide, other medicines, latex, foods, dyes, or preservatives -pregnant or trying to get pregnant -breast-feeding How should I use this medicine? This medicine is for injection under the skin. It is given by a health care professional in a hospital or clinic setting. Contact your pediatrician or health care professional regarding the use of this medicine in children. Special care may be needed. Overdosage: If you think you have taken too much of this medicine contact a poison control center or emergency room at once. NOTE: This medicine is only for you. Do not share this medicine with others. What if I miss a dose? It is important not to miss your dose. Call your doctor or health care professional if you are unable to keep an appointment. What may interact with this medicine? -bromocriptine -cyclosporine -medicines for diabetes, including insulin -medicines for heart disease or hypertension -quinidine This list may not describe all possible interactions. Give your health care provider a list of all the medicines, herbs, non-prescription drugs, or dietary supplements you use. Also tell them if you smoke, drink alcohol, or use illegal drugs. Some items may interact with your medicine. What should I watch for while using  this medicine? Visit your doctor or health care professional for regular checks on your progress. Your condition will be monitored carefully while you are receiving this medicine. This medicine may cause increases or decreases in blood sugar. Signs of high blood sugar include frequent urination, unusual thirst, flushed or dry skin, difficulty breathing, drowsiness, stomach ache, nausea, vomiting or dry mouth. Signs of low blood sugar include chills, cool, pale skin or cold sweats, drowsiness, extreme hunger, fast heartbeat, headache, nausea, nervousness or anxiety, shakiness, trembling, unsteadiness, tiredness, or weakness. Contact your doctor or health care professional right away if you experience any of these symptoms. What side effects may I notice from receiving this medicine? Side effects that you should report to your doctor or health care professional as soon as possible: -allergic reactions like skin rash, itching or hives, swelling of the face, lips, or tongue -changes in blood sugar -changes in heart rate -severe stomach pain Side effects that usually do not require medical attention (report to your doctor or health care professional if they continue or are bothersome): -diarrhea or constipation -gas or stomach pain -nausea, vomiting -pain, redness, swelling and irritation at site where injected This list may not describe all possible side effects. Call your doctor for medical advice about side effects. You may report side effects to FDA at 1-800-FDA-1088. Where should I keep my medicine? This drug is given in a hospital or clinic and will not be stored at home. NOTE: This sheet is a summary. It may not cover all possible information. If you have questions about this medicine, talk to your doctor, pharmacist, or health care provider.  2015, Elsevier/Gold Standard. (2013-11-21 17:43:04)

## 2015-03-04 NOTE — Progress Notes (Signed)
Hematology and Oncology Follow Up Visit  [name] [id number] 22-Feb-1961 54 y.o. 03/04/2015   Principle Diagnosis:   Metastatic neuroendocrine carcinoma  Von Hipple-Lindau Syndrome  Current Therapy:   Somatuline 120mg  SQ q month Sutent 37.5 mg by mouth daily-to start this week     Interim History:  [name] is back for followup. Is having some issues. I think it might be from the neuroendocrine tumors. He's having some sweats. He is having some abdominal discomfort. His appetite is not as good. His blood sugars have not been as high because he is not eating as much as.  His last chromogranin A level was 17.  We did get a urine culture on him a couple weeks ago. This was positive for Escherichia coli. However, this is becoming more resistant. He has allergies to some of the oral medications. We were able to treat this with Macrobid.  He's had some blood pressure issues. Again, this might be from the neuroendocrine tumor.  He's had no rashes. He's had no diarrhea.  Overall, his performance status is ECOG 1. Medications:  Current outpatient prescriptions:  .  baclofen (LIORESAL) 20 MG tablet, Take 20-40 mg by mouth 5 (five) times daily., Disp: , Rfl:  .  diazepam (VALIUM) 5 MG tablet, Take 1 tablet (5 mg total) by mouth 3 (three) times daily as needed for anxiety or muscle spasms., Disp: 40 tablet, Rfl: 1 .  doxazosin (CARDURA) 1 MG tablet, Take 1 tablet (1 mg total) by mouth 2 (two) times daily., Disp: 60 tablet, Rfl: 2 .  dronabinol (MARINOL) 5 MG capsule, Take 1 capsule (5 mg total) by mouth 2 (two) times daily before a meal., Disp: 60 capsule, Rfl: 0 .  ergocalciferol (VITAMIN D2) 50000 UNITS capsule, Take 50,000 Units by mouth once a week. On Fridays, Disp: , Rfl:  .  fluconazole (DIFLUCAN) 100 MG tablet, Take 1 tablet (100 mg total) by mouth daily., Disp: 30 tablet, Rfl: 2 .  HYDROcodone-acetaminophen (NORCO/VICODIN) 5-325 MG per tablet, Take 1 tablet by mouth every  6 (six) hours as needed for moderate pain., Disp: 60 tablet, Rfl: 0 .  insulin glargine (LANTUS) 100 UNIT/ML injection, Inject 25 Units into the skin daily before breakfast. , Disp: , Rfl:  .  insulin lispro (HUMALOG) 100 UNIT/ML injection, Inject 3-9 Units into the skin 3 (three) times daily before meals. SS, Disp: , Rfl:  .  midodrine (PROAMATINE) 5 MG tablet, Take 2 tablets (10 mg total) by mouth 2 (two) times daily as needed (Prior to ambulation)., Disp: , Rfl:  .  Pancrelipase, Lip-Prot-Amyl, 24000 UNITS CPEP, Take 3 capsules by mouth 3 (three) times daily with meals., Disp: , Rfl:  .  polyethylene glycol (MIRALAX / GLYCOLAX) packet, Take 17 g by mouth daily., Disp: 30 each, Rfl: 0 .  promethazine (PHENERGAN) 12.5 MG tablet, Take 12.5 mg by mouth every 6 (six) hours as needed for nausea. , Disp: , Rfl:  .  tiZANidine (ZANAFLEX) 4 MG tablet, TAKE 2 TABLETS BY MOUTH EVERY 6 HOURS AS NEEDED FOR PAIN, Disp: 30 tablet, Rfl: 3 .  traMADol (ULTRAM) 50 MG tablet, Take 1 tablet (50 mg total) by mouth 3 (three) times daily., Disp: 90 tablet, Rfl: 2 .  HYDROmorphone (DILAUDID) 4 MG tablet, Take 4 mg by mouth every 6 (six) hours as needed for severe pain., Disp: , Rfl:  .  SUNItinib (SUTENT) 12.5 MG capsule, Take 3 capsules (37.5 mg total) by mouth daily., Disp: 90  capsule, Rfl: 3  Allergies:  Allergies  Allergen Reactions  . Ciprocin-Fluocin-Procin [Fluocinolone Acetonide] Rash    Pt states this happened with IV Cipro. ABLE TO TAKE PO CIPRO.  . Other Rash    IV-CIPRO    Past Medical History, Surgical history, Social history, and Family History were reviewed and updated.  Review of Systems: As above  Physical Exam:  oral temperature is 97.4 F (36.3 C). His blood pressure is 103/68 and his pulse is 63. His respiration is 16.   Well-developed and well-nourished gentleman. He is paralyzed from the waist down. His head exam shows no ocular or oral lesions. He has no palpable cervical or  supraclavicular lymph nodes. His lungs are clear. Cardiac exam regular rate and rhythm with no murmurs, rubs or bruits. Abdomen is soft. Has good bowel sounds. There is no fluid wave. He has a well-healed laparotomy scar is. He has no obvious abdominal mass. There is no obvious liver or spleen tip. Back exam shows a laminectomy scar in the neck and upper thoracic spine. Extremities shows no swelling in his lower legs. He has no strength secondary to the paralysis. Neurological exam is nonfocal. Skin exam shows no rashes.  Lab Results  Component Value Date   WBC 5.3 03/04/2015   HGB 14.8 03/04/2015   HCT 45.9 03/04/2015   MCV 85 03/04/2015   PLT 265 03/04/2015     Chemistry      Component Value Date/Time   NA 142 03/04/2015 0922   NA 144 08/22/2014 0415   K 3.4 03/04/2015 0922   K 4.2 08/22/2014 0415   CL 98 03/04/2015 0922   CL 106 08/22/2014 0415   CO2 31 03/04/2015 0922   CO2 27 08/22/2014 0415   BUN 19 03/04/2015 0922   BUN 11 08/22/2014 0415   CREATININE 0.9 03/04/2015 0922   CREATININE 0.82 08/22/2014 0415      Component Value Date/Time   CALCIUM 8.9 03/04/2015 0922   CALCIUM 8.8 08/22/2014 0415   ALKPHOS 120* 03/04/2015 0922   ALKPHOS 118* 08/20/2014 0412   AST 30 03/04/2015 0922   AST 18 08/20/2014 0412   ALT 21 03/04/2015 0922   ALT 12 08/20/2014 0412   BILITOT 0.70 03/04/2015 0922   BILITOT 0.3 08/20/2014 0412         Impression and Plan: [name] is 54 year old gentleman with von Hippel-Lindau syndrome. He has multiple neuroendocrine tumors. 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 He's had multiple spinal surgeries.  We will go ahead and start him on Sutent. He is orally been on Afinitor. He is already been on Xeloda/Temodar combination.  I think with the Sutent, we should still do okay. I think that he could tolerate this.  I know that the FDA is going to improve one of the new injectable radioisotope therapies. This is  Lutathera which I think is injectable every couple months. This is improved over in [place] and has had a good track record. Hopefully, the FDA will approve this.  I spent about 45 minutes with he and his wife. Again, I was not expecting him to have progressive disease but yet we have to deal with this. I think that Sutent would be a good idea.  We will plan to see him back in another month.  Volanda Napoleon, MD 3/29/201611:46 AM

## 2015-03-08 LAB — CHROMOGRANIN A: Chromogranin A: 22 ng/mL — ABNORMAL HIGH (ref <=15)

## 2015-03-17 ENCOUNTER — Telehealth: Payer: Self-pay

## 2015-03-17 NOTE — Telephone Encounter (Signed)
Received call from pt reporting he stopped taking Sutent on Friday, April 8 due to abdominal pain, diarrhea & no appetite. Pt feels these side effects of treatment are not worth the benefit of the medication. Pt is willing to restart [employer] or try another therapy. Pt aware Dr Ginette Pitman is out of the office, but he will be notified on his return. dph

## 2015-03-25 ENCOUNTER — Ambulatory Visit (HOSPITAL_BASED_OUTPATIENT_CLINIC_OR_DEPARTMENT_OTHER): Payer: 59 | Admitting: Hematology & Oncology

## 2015-03-25 ENCOUNTER — Encounter: Payer: Self-pay | Admitting: Hematology & Oncology

## 2015-03-25 ENCOUNTER — Other Ambulatory Visit (HOSPITAL_BASED_OUTPATIENT_CLINIC_OR_DEPARTMENT_OTHER): Payer: 59

## 2015-03-25 ENCOUNTER — Ambulatory Visit (HOSPITAL_BASED_OUTPATIENT_CLINIC_OR_DEPARTMENT_OTHER): Payer: 59

## 2015-03-25 VITALS — BP 82/53 | HR 59 | Temp 97.5°F | Resp 18 | Ht 69.0 in

## 2015-03-25 DIAGNOSIS — C7A8 Other malignant neuroendocrine tumors: Secondary | ICD-10-CM | POA: Diagnosis not present

## 2015-03-25 DIAGNOSIS — C7B8 Other secondary neuroendocrine tumors: Secondary | ICD-10-CM

## 2015-03-25 DIAGNOSIS — G839 Paralytic syndrome, unspecified: Secondary | ICD-10-CM | POA: Diagnosis not present

## 2015-03-25 DIAGNOSIS — N319 Neuromuscular dysfunction of bladder, unspecified: Secondary | ICD-10-CM

## 2015-03-25 DIAGNOSIS — Q858 Other phakomatoses, not elsewhere classified: Secondary | ICD-10-CM | POA: Diagnosis not present

## 2015-03-25 DIAGNOSIS — B3749 Other urogenital candidiasis: Secondary | ICD-10-CM

## 2015-03-25 DIAGNOSIS — R64 Cachexia: Secondary | ICD-10-CM

## 2015-03-25 LAB — CBC WITH DIFFERENTIAL (CANCER CENTER ONLY)
BASO#: 0 10*3/uL (ref 0.0–0.2)
BASO%: 0.3 % (ref 0.0–2.0)
EOS ABS: 0 10*3/uL (ref 0.0–0.5)
EOS%: 0.4 % (ref 0.0–7.0)
HCT: 43.9 % (ref 38.7–49.9)
HEMOGLOBIN: 14.3 g/dL (ref 13.0–17.1)
LYMPH#: 1.1 10*3/uL (ref 0.9–3.3)
LYMPH%: 15.2 % (ref 14.0–48.0)
MCH: 27.5 pg — AB (ref 28.0–33.4)
MCHC: 32.6 g/dL (ref 32.0–35.9)
MCV: 84 fL (ref 82–98)
MONO#: 0.6 10*3/uL (ref 0.1–0.9)
MONO%: 9 % (ref 0.0–13.0)
NEUT#: 5.3 10*3/uL (ref 1.5–6.5)
NEUT%: 75.1 % (ref 40.0–80.0)
PLATELETS: 201 10*3/uL (ref 145–400)
RBC: 5.2 10*6/uL (ref 4.20–5.70)
RDW: 14.3 % (ref 11.1–15.7)
WBC: 7 10*3/uL (ref 4.0–10.0)

## 2015-03-25 LAB — CMP (CANCER CENTER ONLY)
ALT(SGPT): 32 U/L (ref 10–47)
AST: 43 U/L — ABNORMAL HIGH (ref 11–38)
Albumin: 3.5 g/dL (ref 3.3–5.5)
Alkaline Phosphatase: 141 U/L — ABNORMAL HIGH (ref 26–84)
BILIRUBIN TOTAL: 0.8 mg/dL (ref 0.20–1.60)
BUN: 19 mg/dL (ref 7–22)
CO2: 32 mEq/L (ref 18–33)
CREATININE: 0.8 mg/dL (ref 0.6–1.2)
Calcium: 9.3 mg/dL (ref 8.0–10.3)
Chloride: 96 mEq/L — ABNORMAL LOW (ref 98–108)
Glucose, Bld: 116 mg/dL (ref 73–118)
Potassium: 3.7 mEq/L (ref 3.3–4.7)
Sodium: 137 mEq/L (ref 128–145)
Total Protein: 7.5 g/dL (ref 6.4–8.1)

## 2015-03-25 MED ORDER — LANREOTIDE ACETATE 120 MG/0.5ML ~~LOC~~ SOLN
120.0000 mg | Freq: Once | SUBCUTANEOUS | Status: AC
  Administered 2015-03-25: 120 mg via SUBCUTANEOUS
  Filled 2015-03-25: qty 120

## 2015-03-25 NOTE — Patient Instructions (Signed)
Lanreotide injection What is this medicine? LANREOTIDE (lan REE oh tide) is used to reduce blood levels of growth hormone in patients with a condition called acromegaly. It also works to slow or stop tumor growth in patients with gastroenteropancreatic neuroendocrine tumor (GEP-NET). This medicine may be used for other purposes; ask your health care provider or pharmacist if you have questions. COMMON BRAND NAME(S): Somatuline [place] What should I tell my health care provider before I take this medicine? They need to know if you have any of these conditions: -diabetes -gallbladder disease -heart disease -kidney disease -liver disease -an unusual or allergic reaction to lanreotide, other medicines, latex, foods, dyes, or preservatives -pregnant or trying to get pregnant -breast-feeding How should I use this medicine? This medicine is for injection under the skin. It is given by a health care professional in a hospital or clinic setting. Contact your pediatrician or health care professional regarding the use of this medicine in children. Special care may be needed. Overdosage: If you think you have taken too much of this medicine contact a poison control center or emergency room at once. NOTE: This medicine is only for you. Do not share this medicine with others. What if I miss a dose? It is important not to miss your dose. Call your doctor or health care professional if you are unable to keep an appointment. What may interact with this medicine? -bromocriptine -cyclosporine -medicines for diabetes, including insulin -medicines for heart disease or hypertension -quinidine This list may not describe all possible interactions. Give your health care provider a list of all the medicines, herbs, non-prescription drugs, or dietary supplements you use. Also tell them if you smoke, drink alcohol, or use illegal drugs. Some items may interact with your medicine. What should I watch for while using  this medicine? Visit your doctor or health care professional for regular checks on your progress. Your condition will be monitored carefully while you are receiving this medicine. This medicine may cause increases or decreases in blood sugar. Signs of high blood sugar include frequent urination, unusual thirst, flushed or dry skin, difficulty breathing, drowsiness, stomach ache, nausea, vomiting or dry mouth. Signs of low blood sugar include chills, cool, pale skin or cold sweats, drowsiness, extreme hunger, fast heartbeat, headache, nausea, nervousness or anxiety, shakiness, trembling, unsteadiness, tiredness, or weakness. Contact your doctor or health care professional right away if you experience any of these symptoms. What side effects may I notice from receiving this medicine? Side effects that you should report to your doctor or health care professional as soon as possible: -allergic reactions like skin rash, itching or hives, swelling of the face, lips, or tongue -changes in blood sugar -changes in heart rate -severe stomach pain Side effects that usually do not require medical attention (report to your doctor or health care professional if they continue or are bothersome): -diarrhea or constipation -gas or stomach pain -nausea, vomiting -pain, redness, swelling and irritation at site where injected This list may not describe all possible side effects. Call your doctor for medical advice about side effects. You may report side effects to FDA at 1-800-FDA-1088. Where should I keep my medicine? This drug is given in a hospital or clinic and will not be stored at home. NOTE: This sheet is a summary. It may not cover all possible information. If you have questions about this medicine, talk to your doctor, pharmacist, or health care provider.  2015, Elsevier/Gold Standard. (2013-11-21 17:43:04)

## 2015-03-26 NOTE — Progress Notes (Signed)
Hematology and Oncology Follow Up Visit  [name] [id number] Jan 31, 1961 54 y.o. 03/26/2015   Principle Diagnosis:   Metastatic neuroendocrine carcinoma  Von Hipple-Lindau Syndrome  Current Therapy:   Somatuline 120mg  SQ q month Sutent 37.5 mg by mouth daily-patient stopped after 2 weeks due to toxicity     Interim History:  [name] is back for followup. He apparently stopped the CT and a couple weeks ago. He said that he just was not doing well with it. It was causing a lot of nausea. He had no appetite. He is having some abdominal spasms. His quality of life is what he wants. As such, he stop the Sutent.  He goes up to the [facility] next week or another evaluation. I'm sure they will but will be doing MRIs and scans of his brain and spinal cord.  He's had no fever. He's had fairly good blood pressure control. He does have these episodes of hypertension.  His blood sugars have been doing fairly well.  He's had no pain problems.  He is on Marinol.  His last chromogranin A level was 22 back in late March. tumor.  He's had no rashes. He's had no diarrhea.  Overall, his performance status is ECOG 1. Medications:  Current outpatient prescriptions:  .  baclofen (LIORESAL) 20 MG tablet, Take 20-40 mg by mouth 5 (five) times daily., Disp: , Rfl:  .  diazepam (VALIUM) 5 MG tablet, Take 1 tablet (5 mg total) by mouth 3 (three) times daily as needed for anxiety or muscle spasms., Disp: 40 tablet, Rfl: 1 .  doxazosin (CARDURA) 1 MG tablet, Take 1 tablet (1 mg total) by mouth 2 (two) times daily., Disp: 60 tablet, Rfl: 2 .  dronabinol (MARINOL) 5 MG capsule, Take 1 capsule (5 mg total) by mouth 2 (two) times daily before a meal., Disp: 60 capsule, Rfl: 0 .  ergocalciferol (VITAMIN D2) 50000 UNITS capsule, Take 50,000 Units by mouth once a week. On Fridays, Disp: , Rfl:  .  fluconazole (DIFLUCAN) 100 MG tablet, Take 1 tablet (100 mg total) by mouth daily., Disp: 30 tablet, Rfl:  2 .  HYDROcodone-acetaminophen (NORCO/VICODIN) 5-325 MG per tablet, Take 1 tablet by mouth every 6 (six) hours as needed for moderate pain., Disp: 60 tablet, Rfl: 0 .  HYDROmorphone (DILAUDID) 4 MG tablet, Take 4 mg by mouth every 6 (six) hours as needed for severe pain., Disp: , Rfl:  .  insulin glargine (LANTUS) 100 UNIT/ML injection, Inject 25 Units into the skin daily before breakfast. , Disp: , Rfl:  .  insulin lispro (HUMALOG) 100 UNIT/ML injection, Inject 3-9 Units into the skin 3 (three) times daily before meals. SS, Disp: , Rfl:  .  midodrine (PROAMATINE) 5 MG tablet, Take 2 tablets (10 mg total) by mouth 2 (two) times daily as needed (Prior to ambulation)., Disp: , Rfl:  .  Pancrelipase, Lip-Prot-Amyl, 24000 UNITS CPEP, Take 3 capsules by mouth 3 (three) times daily with meals., Disp: , Rfl:  .  polyethylene glycol (MIRALAX / GLYCOLAX) packet, Take 17 g by mouth daily., Disp: 30 each, Rfl: 0 .  promethazine (PHENERGAN) 12.5 MG tablet, Take 12.5 mg by mouth every 6 (six) hours as needed for nausea. , Disp: , Rfl:  .  tiZANidine (ZANAFLEX) 4 MG tablet, TAKE 2 TABLETS BY MOUTH EVERY 6 HOURS AS NEEDED FOR PAIN, Disp: 30 tablet, Rfl: 3 .  traMADol (ULTRAM) 50 MG tablet, Take 1 tablet (50 mg total) by mouth  3 (three) times daily., Disp: 90 tablet, Rfl: 2 .  SUNItinib (SUTENT) 12.5 MG capsule, Take 3 capsules (37.5 mg total) by mouth daily. (Patient not taking: Reported on 03/25/2015), Disp: 90 capsule, Rfl: 3  Allergies:  Allergies  Allergen Reactions  . Ciprocin-Fluocin-Procin [Fluocinolone Acetonide] Rash    Pt states this happened with IV Cipro. ABLE TO TAKE PO CIPRO.  . Other Rash    IV-CIPRO    Past Medical History, Surgical history, Social history, and Family History were reviewed and updated.  Review of Systems: As above  Physical Exam:  height is 5\' 9"  (1.753 m). His oral temperature is 97.5 F (36.4 C). His blood pressure is 82/53 and his pulse is 59. His respiration is 18.    Well-developed and well-nourished gentleman. He is paralyzed from the waist down. His head exam shows no ocular or oral lesions. He has no palpable cervical or supraclavicular lymph nodes. His lungs are clear. Cardiac exam regular rate and rhythm with no murmurs, rubs or bruits. Abdomen is soft. Has good bowel sounds. There is no fluid wave. He has a well-healed laparotomy scar is. He has no obvious abdominal mass. There is no obvious liver or spleen tip. Back exam shows a laminectomy scar in the neck and upper thoracic spine. Extremities shows no swelling in his lower legs. He has no strength secondary to the paralysis. Neurological exam is nonfocal. Skin exam shows no rashes.  Lab Results  Component Value Date   WBC 7.0 03/25/2015   HGB 14.3 03/25/2015   HCT 43.9 03/25/2015   MCV 84 03/25/2015   PLT 201 03/25/2015     Chemistry      Component Value Date/Time   NA 137 03/25/2015 0954   NA 144 08/22/2014 0415   K 3.7 03/25/2015 0954   K 4.2 08/22/2014 0415   CL 96* 03/25/2015 0954   CL 106 08/22/2014 0415   CO2 32 03/25/2015 0954   CO2 27 08/22/2014 0415   BUN 19 03/25/2015 0954   BUN 11 08/22/2014 0415   CREATININE 0.8 03/25/2015 0954   CREATININE 0.82 08/22/2014 0415      Component Value Date/Time   CALCIUM 9.3 03/25/2015 0954   CALCIUM 8.8 08/22/2014 0415   ALKPHOS 141* 03/25/2015 0954   ALKPHOS 118* 08/20/2014 0412   AST 43* 03/25/2015 0954   AST 18 08/20/2014 0412   ALT 32 03/25/2015 0954   ALT 12 08/20/2014 0412   BILITOT 0.80 03/25/2015 0954   BILITOT 0.3 08/20/2014 0412         Impression and Plan: [name] is 54 year old gentleman with von Hippel-Lindau syndrome. He has multiple neuroendocrine tumors. 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 He's had multiple spinal surgeries.  For now, we will just came off the Sutent. I don't think that decreasing his dose would be helpful right now. I just want to keep him off treatment. We will  do the Somatuline.  He is due for scans in May. We will see what the scans show. If we find that he does have progression, then we will re-consider Sutent at a lower dose or possibly go back to Xeloda/Temodar which seem to be fairly helpful for him and well-tolerated. We will plan to see him back in another month.  Volanda Napoleon, MD 4/20/20167:30 AM

## 2015-03-29 LAB — LACTATE DEHYDROGENASE: LDH: 146 U/L (ref 94–250)

## 2015-03-29 LAB — CHROMOGRANIN A: CHROMOGRANIN A: 14 ng/mL (ref <=15)

## 2015-03-31 ENCOUNTER — Other Ambulatory Visit: Payer: Self-pay | Admitting: Nurse Practitioner

## 2015-03-31 DIAGNOSIS — R64 Cachexia: Secondary | ICD-10-CM

## 2015-03-31 DIAGNOSIS — C7A8 Other malignant neuroendocrine tumors: Secondary | ICD-10-CM

## 2015-03-31 DIAGNOSIS — B3749 Other urogenital candidiasis: Secondary | ICD-10-CM

## 2015-03-31 MED ORDER — DRONABINOL 5 MG PO CAPS: 5.0000 mg | ORAL_CAPSULE | Freq: Two times a day (BID) | ORAL | Status: DC

## 2015-04-22 ENCOUNTER — Ambulatory Visit (HOSPITAL_BASED_OUTPATIENT_CLINIC_OR_DEPARTMENT_OTHER): Payer: 59

## 2015-04-22 ENCOUNTER — Encounter (HOSPITAL_BASED_OUTPATIENT_CLINIC_OR_DEPARTMENT_OTHER): Payer: Self-pay

## 2015-04-22 ENCOUNTER — Ambulatory Visit (HOSPITAL_BASED_OUTPATIENT_CLINIC_OR_DEPARTMENT_OTHER)
Admission: RE | Admit: 2015-04-22 | Discharge: 2015-04-22 | Disposition: A | Discharge status: Home / Self Care | Payer: 59 | Source: Ambulatory Visit | Attending: Hematology & Oncology | Admitting: Hematology & Oncology

## 2015-04-22 ENCOUNTER — Other Ambulatory Visit (HOSPITAL_BASED_OUTPATIENT_CLINIC_OR_DEPARTMENT_OTHER): Payer: 59

## 2015-04-22 ENCOUNTER — Ambulatory Visit (HOSPITAL_BASED_OUTPATIENT_CLINIC_OR_DEPARTMENT_OTHER): Payer: 59 | Admitting: Hematology & Oncology

## 2015-04-22 VITALS — BP 82/56 | HR 73 | Temp 98.6°F

## 2015-04-22 DIAGNOSIS — R911 Solitary pulmonary nodule: Secondary | ICD-10-CM | POA: Diagnosis not present

## 2015-04-22 DIAGNOSIS — C7A8 Other malignant neuroendocrine tumors: Secondary | ICD-10-CM

## 2015-04-22 DIAGNOSIS — C7A1 Malignant poorly differentiated neuroendocrine tumors: Secondary | ICD-10-CM | POA: Diagnosis not present

## 2015-04-22 DIAGNOSIS — Z905 Acquired absence of kidney: Secondary | ICD-10-CM | POA: Insufficient documentation

## 2015-04-22 DIAGNOSIS — D509 Iron deficiency anemia, unspecified: Secondary | ICD-10-CM

## 2015-04-22 DIAGNOSIS — N319 Neuromuscular dysfunction of bladder, unspecified: Secondary | ICD-10-CM

## 2015-04-22 DIAGNOSIS — C787 Secondary malignant neoplasm of liver and intrahepatic bile duct: Secondary | ICD-10-CM | POA: Insufficient documentation

## 2015-04-22 DIAGNOSIS — R933 Abnormal findings on diagnostic imaging of other parts of digestive tract: Secondary | ICD-10-CM | POA: Diagnosis not present

## 2015-04-22 DIAGNOSIS — Q858 Other phakomatoses, not elsewhere classified: Secondary | ICD-10-CM | POA: Insufficient documentation

## 2015-04-22 DIAGNOSIS — Z08 Encounter for follow-up examination after completed treatment for malignant neoplasm: Secondary | ICD-10-CM | POA: Insufficient documentation

## 2015-04-22 DIAGNOSIS — C7B8 Other secondary neuroendocrine tumors: Secondary | ICD-10-CM | POA: Diagnosis not present

## 2015-04-22 DIAGNOSIS — Z853 Personal history of malignant neoplasm of breast: Secondary | ICD-10-CM | POA: Insufficient documentation

## 2015-04-22 LAB — CMP (CANCER CENTER ONLY)
ALBUMIN: 3.6 g/dL (ref 3.3–5.5)
ALK PHOS: 132 U/L — AB (ref 26–84)
ALT: 24 U/L (ref 10–47)
AST: 29 U/L (ref 11–38)
BILIRUBIN TOTAL: 0.7 mg/dL (ref 0.20–1.60)
BUN, Bld: 20 mg/dL (ref 7–22)
CO2: 30 mEq/L (ref 18–33)
Calcium: 9.2 mg/dL (ref 8.0–10.3)
Chloride: 99 mEq/L (ref 98–108)
Creat: 0.7 mg/dl (ref 0.6–1.2)
Glucose, Bld: 113 mg/dL (ref 73–118)
POTASSIUM: 3.7 meq/L (ref 3.3–4.7)
Sodium: 138 mEq/L (ref 128–145)
TOTAL PROTEIN: 7.3 g/dL (ref 6.4–8.1)

## 2015-04-22 LAB — CBC WITH DIFFERENTIAL (CANCER CENTER ONLY)
BASO#: 0 10*3/uL (ref 0.0–0.2)
BASO%: 0.1 % (ref 0.0–2.0)
EOS ABS: 0.1 10*3/uL (ref 0.0–0.5)
EOS%: 0.9 % (ref 0.0–7.0)
HCT: 44.7 % (ref 38.7–49.9)
HEMOGLOBIN: 14.7 g/dL (ref 13.0–17.1)
LYMPH#: 1.1 10*3/uL (ref 0.9–3.3)
LYMPH%: 14.6 % (ref 14.0–48.0)
MCH: 28 pg (ref 28.0–33.4)
MCHC: 32.9 g/dL (ref 32.0–35.9)
MCV: 85 fL (ref 82–98)
MONO#: 0.6 10*3/uL (ref 0.1–0.9)
MONO%: 7.1 % (ref 0.0–13.0)
NEUT%: 77.3 % (ref 40.0–80.0)
NEUTROS ABS: 6 10*3/uL (ref 1.5–6.5)
Platelets: 204 10*3/uL (ref 145–400)
RBC: 5.25 10*6/uL (ref 4.20–5.70)
RDW: 13.9 % (ref 11.1–15.7)
WBC: 7.7 10*3/uL (ref 4.0–10.0)

## 2015-04-22 MED ORDER — LANREOTIDE ACETATE 120 MG/0.5ML ~~LOC~~ SOLN
120.0000 mg | Freq: Once | SUBCUTANEOUS | Status: AC
  Administered 2015-04-22: 120 mg via SUBCUTANEOUS
  Filled 2015-04-22: qty 120

## 2015-04-22 MED ORDER — IOHEXOL 300 MG/ML  SOLN
100.0000 mL | Freq: Once | INTRAMUSCULAR | Status: AC | PRN
  Administered 2015-04-22: 100 mL via INTRAVENOUS

## 2015-04-22 NOTE — Progress Notes (Signed)
Hematology and Oncology Follow Up Visit  Keywon Mestre [id number] 12/17/1960 54 y.o. 04/22/2015   Principle Diagnosis:   Metastatic neuroendocrine carcinoma  Von Hipple-Lindau Syndrome  Current Therapy:   Somatuline 120mg  SQ q month     Interim History:  Mr.  Moody is back for followup.he was seen up at the [facility] recently area and apparently there is a cerebellar lesion that is growing a little bit. However, the doctors up there said that since he is asymptomatic, they'll think that he needs any surgical intervention or radiation.  We went ahead and repeated his scans down here. A CT scan of the chest abdomen and pelvis were done. This bases showed mild progression of liver disease. He had no new lesions. Again is asymptomatic with this. His chromogranin A level is 14.  He's had no issues with his urine. He has a chronic indwelling Foley.  There's been no issues with fever.  He's had no cough. He's had no rashes. He's had some hypo-tension. I think his had a couple episodes of near syncope.  He has diabetes. His wife is doing great job with him and try to manage the diabetes so that his levels are not too high or too low.  Overall, his performance status is ECOG 1.   Medications:  Current outpatient prescriptions:  .  baclofen (LIORESAL) 20 MG tablet, Take 20-40 mg by mouth 5 (five) times daily., Disp: , Rfl:  .  diazepam (VALIUM) 5 MG tablet, Take 1 tablet (5 mg total) by mouth 3 (three) times daily as needed for anxiety or muscle spasms., Disp: 40 tablet, Rfl: 1 .  doxazosin (CARDURA) 1 MG tablet, Take 1 tablet (1 mg total) by mouth 2 (two) times daily., Disp: 60 tablet, Rfl: 2 .  dronabinol (MARINOL) 5 MG capsule, Take 1 capsule (5 mg total) by mouth 2 (two) times daily before a meal., Disp: 60 capsule, Rfl: 1 .  ergocalciferol (VITAMIN D2) 50000 UNITS capsule, Take 50,000 Units by mouth once a week. On Fridays, Disp: , Rfl:  .  fluconazole (DIFLUCAN) 100 MG tablet, Take  1 tablet (100 mg total) by mouth daily., Disp: 30 tablet, Rfl: 2 .  HYDROcodone-acetaminophen (NORCO/VICODIN) 5-325 MG per tablet, Take 1 tablet by mouth every 6 (six) hours as needed for moderate pain., Disp: 60 tablet, Rfl: 0 .  HYDROmorphone (DILAUDID) 4 MG tablet, Take 4 mg by mouth every 6 (six) hours as needed for severe pain., Disp: , Rfl:  .  insulin glargine (LANTUS) 100 UNIT/ML injection, Inject 25 Units into the skin daily before breakfast. , Disp: , Rfl:  .  insulin lispro (HUMALOG) 100 UNIT/ML injection, Inject 3-9 Units into the skin 3 (three) times daily before meals. SS, Disp: , Rfl:  .  midodrine (PROAMATINE) 5 MG tablet, Take 2 tablets (10 mg total) by mouth 2 (two) times daily as needed (Prior to ambulation)., Disp: , Rfl:  .  Pancrelipase, Lip-Prot-Amyl, 24000 UNITS CPEP, Take 3 capsules by mouth 3 (three) times daily with meals., Disp: , Rfl:  .  polyethylene glycol (MIRALAX / GLYCOLAX) packet, Take 17 g by mouth daily., Disp: 30 each, Rfl: 0 .  promethazine (PHENERGAN) 12.5 MG tablet, Take 12.5 mg by mouth every 6 (six) hours as needed for nausea. , Disp: , Rfl:  .  SUNItinib (SUTENT) 12.5 MG capsule, Take 3 capsules (37.5 mg total) by mouth daily., Disp: 90 capsule, Rfl: 3 .  tiZANidine (ZANAFLEX) 4 MG tablet, TAKE 2 TABLETS BY  MOUTH EVERY 6 HOURS AS NEEDED FOR PAIN, Disp: 30 tablet, Rfl: 3 .  traMADol (ULTRAM) 50 MG tablet, Take 1 tablet (50 mg total) by mouth 3 (three) times daily., Disp: 90 tablet, Rfl: 2  Allergies:  Allergies  Allergen Reactions  . Ciprocin-Fluocin-Procin [Fluocinolone Acetonide] Rash    Pt states this happened with IV Cipro. ABLE TO TAKE PO CIPRO.  . Other Rash    IV-CIPRO    Past Medical History, Surgical history, Social history, and Family History were reviewed and updated.  Review of Systems: As above  Physical Exam:  oral temperature is 98.6 F (37 C). His blood pressure is 82/56 and his pulse is 73.   Well-developed and well-nourished  gentleman. He is paralyzed from the waist down. His head exam shows no ocular or oral lesions. He has no palpable cervical or supraclavicular lymph nodes. His lungs are clear. Cardiac exam regular rate and rhythm with no murmurs, rubs or bruits. Abdomen is soft. Has good bowel sounds. There is no fluid wave. He has a well-healed laparotomy scar is. He has no obvious abdominal mass. There is no obvious liver or spleen tip. Back exam shows a laminectomy scar in the neck and upper thoracic spine. Extremities shows no swelling in his lower legs. He has no strength secondary to the paralysis. Neurological exam is nonfocal. Skin exam shows no rashes.  Lab Results  Component Value Date   WBC 7.7 04/22/2015   HGB 14.7 04/22/2015   HCT 44.7 04/22/2015   MCV 85 04/22/2015   PLT 204 04/22/2015     Chemistry      Component Value Date/Time   NA 138 04/22/2015 0908   NA 144 08/22/2014 0415   K 3.7 04/22/2015 0908   K 4.2 08/22/2014 0415   CL 99 04/22/2015 0908   CL 106 08/22/2014 0415   CO2 30 04/22/2015 0908   CO2 27 08/22/2014 0415   BUN 20 04/22/2015 0908   BUN 11 08/22/2014 0415   CREATININE 0.7 04/22/2015 0908   CREATININE 0.82 08/22/2014 0415      Component Value Date/Time   CALCIUM 9.2 04/22/2015 0908   CALCIUM 8.8 08/22/2014 0415   ALKPHOS 132* 04/22/2015 0908   ALKPHOS 118* 08/20/2014 0412   AST 29 04/22/2015 0908   AST 18 08/20/2014 0412   ALT 24 04/22/2015 0908   ALT 12 08/20/2014 0412   BILITOT 0.70 04/22/2015 0908   BILITOT 0.3 08/20/2014 0412         Impression and Plan: Mr. Serfass is 54 year old gentleman with von Hippel-Lindau syndrome. He has multiple neuroendocrine tumors. 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 He's had multiple spinal surgeries.  For now, we will just continue to watch him. Again, he is asymptomatic. His disease has progressed minimally.  I probably would not do another scan on him for 2 months.  He will get his  Somatuline today.  I'll plan to see him back in one month.  Volanda Napoleon, MD 5/17/201612:50 PM

## 2015-04-22 NOTE — Patient Instructions (Signed)
Lanreotide injection What is this medicine? LANREOTIDE (lan REE oh tide) is used to reduce blood levels of growth hormone in patients with a condition called acromegaly. It also works to slow or stop tumor growth in patients with gastroenteropancreatic neuroendocrine tumor (GEP-NET). This medicine may be used for other purposes; ask your health care provider or pharmacist if you have questions. COMMON BRAND NAME(S): Somatuline [place] What should I tell my health care provider before I take this medicine? They need to know if you have any of these conditions: -diabetes -gallbladder disease -heart disease -kidney disease -liver disease -an unusual or allergic reaction to lanreotide, other medicines, latex, foods, dyes, or preservatives -pregnant or trying to get pregnant -breast-feeding How should I use this medicine? This medicine is for injection under the skin. It is given by a health care professional in a hospital or clinic setting. Contact your pediatrician or health care professional regarding the use of this medicine in children. Special care may be needed. Overdosage: If you think you have taken too much of this medicine contact a poison control center or emergency room at once. NOTE: This medicine is only for you. Do not share this medicine with others. What if I miss a dose? It is important not to miss your dose. Call your doctor or health care professional if you are unable to keep an appointment. What may interact with this medicine? -bromocriptine -cyclosporine -medicines for diabetes, including insulin -medicines for heart disease or hypertension -quinidine This list may not describe all possible interactions. Give your health care provider a list of all the medicines, herbs, non-prescription drugs, or dietary supplements you use. Also tell them if you smoke, drink alcohol, or use illegal drugs. Some items may interact with your medicine. What should I watch for while using  this medicine? Visit your doctor or health care professional for regular checks on your progress. Your condition will be monitored carefully while you are receiving this medicine. This medicine may cause increases or decreases in blood sugar. Signs of high blood sugar include frequent urination, unusual thirst, flushed or dry skin, difficulty breathing, drowsiness, stomach ache, nausea, vomiting or dry mouth. Signs of low blood sugar include chills, cool, pale skin or cold sweats, drowsiness, extreme hunger, fast heartbeat, headache, nausea, nervousness or anxiety, shakiness, trembling, unsteadiness, tiredness, or weakness. Contact your doctor or health care professional right away if you experience any of these symptoms. What side effects may I notice from receiving this medicine? Side effects that you should report to your doctor or health care professional as soon as possible: -allergic reactions like skin rash, itching or hives, swelling of the face, lips, or tongue -changes in blood sugar -changes in heart rate -severe stomach pain Side effects that usually do not require medical attention (report to your doctor or health care professional if they continue or are bothersome): -diarrhea or constipation -gas or stomach pain -nausea, vomiting -pain, redness, swelling and irritation at site where injected This list may not describe all possible side effects. Call your doctor for medical advice about side effects. You may report side effects to FDA at 1-800-FDA-1088. Where should I keep my medicine? This drug is given in a hospital or clinic and will not be stored at home. NOTE: This sheet is a summary. It may not cover all possible information. If you have questions about this medicine, talk to your doctor, pharmacist, or health care provider.  2015, Elsevier/Gold Standard. (2013-11-21 17:43:04)

## 2015-04-28 LAB — CHROMOGRANIN A: Chromogranin A: 23 ng/mL — ABNORMAL HIGH (ref <=15)

## 2015-05-01 DIAGNOSIS — E109 Type 1 diabetes mellitus without complications: Secondary | ICD-10-CM | POA: Diagnosis not present

## 2015-05-12 ENCOUNTER — Other Ambulatory Visit: Payer: Self-pay | Admitting: Hematology & Oncology

## 2015-05-14 ENCOUNTER — Telehealth: Payer: Self-pay | Admitting: Hematology & Oncology

## 2015-05-14 NOTE — Telephone Encounter (Signed)
Faxed [employer] 05/20/2015 - 05/19/2016 to:  F: [phone] P: [phone]    REF: [id number]    COPY SCANNED

## 2015-05-20 ENCOUNTER — Encounter: Payer: Self-pay | Admitting: Hematology & Oncology

## 2015-05-20 ENCOUNTER — Ambulatory Visit (HOSPITAL_BASED_OUTPATIENT_CLINIC_OR_DEPARTMENT_OTHER): Payer: 59 | Admitting: Hematology & Oncology

## 2015-05-20 ENCOUNTER — Other Ambulatory Visit (HOSPITAL_BASED_OUTPATIENT_CLINIC_OR_DEPARTMENT_OTHER): Payer: 59

## 2015-05-20 ENCOUNTER — Ambulatory Visit (HOSPITAL_BASED_OUTPATIENT_CLINIC_OR_DEPARTMENT_OTHER): Payer: 59

## 2015-05-20 VITALS — BP 91/67 | HR 89 | Temp 98.4°F | Resp 14

## 2015-05-20 DIAGNOSIS — C7B8 Other secondary neuroendocrine tumors: Secondary | ICD-10-CM | POA: Diagnosis not present

## 2015-05-20 DIAGNOSIS — Q858 Other phakomatoses, not elsewhere classified: Secondary | ICD-10-CM

## 2015-05-20 DIAGNOSIS — D509 Iron deficiency anemia, unspecified: Secondary | ICD-10-CM

## 2015-05-20 DIAGNOSIS — C7A8 Other malignant neuroendocrine tumors: Secondary | ICD-10-CM

## 2015-05-20 DIAGNOSIS — N319 Neuromuscular dysfunction of bladder, unspecified: Secondary | ICD-10-CM

## 2015-05-20 LAB — CBC WITH DIFFERENTIAL (CANCER CENTER ONLY)
BASO#: 0 10*3/uL (ref 0.0–0.2)
BASO%: 0.3 % (ref 0.0–2.0)
EOS%: 1.7 % (ref 0.0–7.0)
Eosinophils Absolute: 0.1 10*3/uL (ref 0.0–0.5)
HCT: 45.1 % (ref 38.7–49.9)
HEMOGLOBIN: 15.1 g/dL (ref 13.0–17.1)
LYMPH#: 1.7 10*3/uL (ref 0.9–3.3)
LYMPH%: 24.9 % (ref 14.0–48.0)
MCH: 28.1 pg (ref 28.0–33.4)
MCHC: 33.5 g/dL (ref 32.0–35.9)
MCV: 84 fL (ref 82–98)
MONO#: 0.6 10*3/uL (ref 0.1–0.9)
MONO%: 9.5 % (ref 0.0–13.0)
NEUT#: 4.2 10*3/uL (ref 1.5–6.5)
NEUT%: 63.6 % (ref 40.0–80.0)
PLATELETS: 245 10*3/uL (ref 145–400)
RBC: 5.38 10*6/uL (ref 4.20–5.70)
RDW: 13.1 % (ref 11.1–15.7)
WBC: 6.7 10*3/uL (ref 4.0–10.0)

## 2015-05-20 LAB — COMPREHENSIVE METABOLIC PANEL
ALT: 23 U/L (ref 0–53)
AST: 33 U/L (ref 0–37)
Albumin: 3.5 g/dL (ref 3.5–5.2)
Alkaline Phosphatase: 150 U/L — ABNORMAL HIGH (ref 39–117)
BILIRUBIN TOTAL: 0.5 mg/dL (ref 0.2–1.2)
BUN: 19 mg/dL (ref 6–23)
CALCIUM: 9 mg/dL (ref 8.4–10.5)
CO2: 28 meq/L (ref 19–32)
Chloride: 99 mEq/L (ref 96–112)
Creatinine, Ser: 0.67 mg/dL (ref 0.50–1.35)
GLUCOSE: 81 mg/dL (ref 70–99)
POTASSIUM: 4.1 meq/L (ref 3.5–5.3)
Sodium: 137 mEq/L (ref 135–145)
TOTAL PROTEIN: 6.8 g/dL (ref 6.0–8.3)

## 2015-05-20 MED ORDER — LANREOTIDE ACETATE 120 MG/0.5ML ~~LOC~~ SOLN
120.0000 mg | Freq: Once | SUBCUTANEOUS | Status: AC
  Administered 2015-05-20: 120 mg via SUBCUTANEOUS
  Filled 2015-05-20: qty 120

## 2015-05-20 MED ORDER — HYDROCODONE-ACETAMINOPHEN 5-325 MG PO TABS: 1.0000 | ORAL_TABLET | Freq: Four times a day (QID) | ORAL | Status: DC | PRN

## 2015-05-20 NOTE — Patient Instructions (Signed)
Lanreotide injection What is this medicine? LANREOTIDE (lan REE oh tide) is used to reduce blood levels of growth hormone in patients with a condition called acromegaly. It also works to slow or stop tumor growth in patients with gastroenteropancreatic neuroendocrine tumor (GEP-NET). This medicine may be used for other purposes; ask your health care provider or pharmacist if you have questions. COMMON BRAND NAME(S): Somatuline [place] What should I tell my health care provider before I take this medicine? They need to know if you have any of these conditions: -diabetes -gallbladder disease -heart disease -kidney disease -liver disease -an unusual or allergic reaction to lanreotide, other medicines, latex, foods, dyes, or preservatives -pregnant or trying to get pregnant -breast-feeding How should I use this medicine? This medicine is for injection under the skin. It is given by a health care professional in a hospital or clinic setting. Contact your pediatrician or health care professional regarding the use of this medicine in children. Special care may be needed. Overdosage: If you think you have taken too much of this medicine contact a poison control center or emergency room at once. NOTE: This medicine is only for you. Do not share this medicine with others. What if I miss a dose? It is important not to miss your dose. Call your doctor or health care professional if you are unable to keep an appointment. What may interact with this medicine? -bromocriptine -cyclosporine -medicines for diabetes, including insulin -medicines for heart disease or hypertension -quinidine This list may not describe all possible interactions. Give your health care provider a list of all the medicines, herbs, non-prescription drugs, or dietary supplements you use. Also tell them if you smoke, drink alcohol, or use illegal drugs. Some items may interact with your medicine. What should I watch for while using  this medicine? Visit your doctor or health care professional for regular checks on your progress. Your condition will be monitored carefully while you are receiving this medicine. This medicine may cause increases or decreases in blood sugar. Signs of high blood sugar include frequent urination, unusual thirst, flushed or dry skin, difficulty breathing, drowsiness, stomach ache, nausea, vomiting or dry mouth. Signs of low blood sugar include chills, cool, pale skin or cold sweats, drowsiness, extreme hunger, fast heartbeat, headache, nausea, nervousness or anxiety, shakiness, trembling, unsteadiness, tiredness, or weakness. Contact your doctor or health care professional right away if you experience any of these symptoms. What side effects may I notice from receiving this medicine? Side effects that you should report to your doctor or health care professional as soon as possible: -allergic reactions like skin rash, itching or hives, swelling of the face, lips, or tongue -changes in blood sugar -changes in heart rate -severe stomach pain Side effects that usually do not require medical attention (report to your doctor or health care professional if they continue or are bothersome): -diarrhea or constipation -gas or stomach pain -nausea, vomiting -pain, redness, swelling and irritation at site where injected This list may not describe all possible side effects. Call your doctor for medical advice about side effects. You may report side effects to FDA at 1-800-FDA-1088. Where should I keep my medicine? This drug is given in a hospital or clinic and will not be stored at home. NOTE: This sheet is a summary. It may not cover all possible information. If you have questions about this medicine, talk to your doctor, pharmacist, or health care provider.  2015, Elsevier/Gold Standard. (2013-11-21 17:43:04)

## 2015-05-20 NOTE — Progress Notes (Signed)
Hematology and Oncology Follow Up Visit  [name] [id number] 1961-07-30 54 y.o. 05/20/2015   Principle Diagnosis:   Metastatic neuroendocrine carcinoma  Von Hipple-Lindau Syndrome  Current Therapy:   Somatuline 120mg  SQ q month     Interim History:  [name] is back for followup.he looks quite good. He does not have his Ipad with him today. Apparently, there was an issue with his indwelling Foley and it leaked.  He does have some muscle spasms and discomfort. This has been on and off. He does have pain medication for this. I did refill his Norco. He also has some anti-spasmodics.  His last chromogranin A level was 22. This is up a little bit.  His blood sugars have been doing fairly well. He and his wife will do a good job and try to manage these for him.  He's had no cough. He's had no fever. He does have some sweats.  His appetite seems were doing pretty well overall.  He's had no diarrhea. He's had some abdominal spasms which are chronic.  Overall, his performance status is ECOG 1.   Medications:  Current outpatient prescriptions:  .  baclofen (LIORESAL) 20 MG tablet, Take 20-40 mg by mouth 5 (five) times daily., Disp: , Rfl:  .  diazepam (VALIUM) 5 MG tablet, TAKE 1 TABLET BY MOUTH 3 TIMES DAILY AS NEEDED FOR ANXIETY OR MUSCLE SPASMS, Disp: 40 tablet, Rfl: 1 .  doxazosin (CARDURA) 1 MG tablet, Take 1 tablet (1 mg total) by mouth 2 (two) times daily., Disp: 60 tablet, Rfl: 2 .  dronabinol (MARINOL) 5 MG capsule, Take 1 capsule (5 mg total) by mouth 2 (two) times daily before a meal., Disp: 60 capsule, Rfl: 1 .  ergocalciferol (VITAMIN D2) 50000 UNITS capsule, Take 50,000 Units by mouth once a week. On Fridays, Disp: , Rfl:  .  fluconazole (DIFLUCAN) 100 MG tablet, Take 1 tablet (100 mg total) by mouth daily., Disp: 30 tablet, Rfl: 2 .  HYDROcodone-acetaminophen (NORCO/VICODIN) 5-325 MG per tablet, Take 1 tablet by mouth every 6 (six) hours as needed for  moderate pain., Disp: 90 tablet, Rfl: 0 .  HYDROmorphone (DILAUDID) 4 MG tablet, Take 4 mg by mouth every 6 (six) hours as needed for severe pain., Disp: , Rfl:  .  insulin glargine (LANTUS) 100 UNIT/ML injection, Inject 25 Units into the skin daily before breakfast. , Disp: , Rfl:  .  insulin lispro (HUMALOG) 100 UNIT/ML injection, Inject 3-9 Units into the skin 3 (three) times daily before meals. SS, Disp: , Rfl:  .  midodrine (PROAMATINE) 5 MG tablet, Take 2 tablets (10 mg total) by mouth 2 (two) times daily as needed (Prior to ambulation)., Disp: , Rfl:  .  Pancrelipase, Lip-Prot-Amyl, 24000 UNITS CPEP, Take 3 capsules by mouth 3 (three) times daily with meals., Disp: , Rfl:  .  polyethylene glycol (MIRALAX / GLYCOLAX) packet, Take 17 g by mouth daily., Disp: 30 each, Rfl: 0 .  promethazine (PHENERGAN) 12.5 MG tablet, Take 12.5 mg by mouth every 6 (six) hours as needed for nausea. , Disp: , Rfl:  .  tiZANidine (ZANAFLEX) 4 MG tablet, TAKE 2 TABLETS BY MOUTH EVERY 6 HOURS AS NEEDED FOR PAIN, Disp: 30 tablet, Rfl: 3 .  traMADol (ULTRAM) 50 MG tablet, Take 1 tablet (50 mg total) by mouth 3 (three) times daily., Disp: 90 tablet, Rfl: 2  Allergies:  Allergies  Allergen Reactions  . Ciprocin-Fluocin-Procin [Fluocinolone Acetonide] Rash    Pt  states this happened with IV Cipro. ABLE TO TAKE PO CIPRO.  . Other Rash    IV-CIPRO    Past Medical History, Surgical history, Social history, and Family History were reviewed and updated.  Review of Systems: As above  Physical Exam:  oral temperature is 98.4 F (36.9 C). His blood pressure is 91/67 and his pulse is 89. His respiration is 14.   Well-developed and well-nourished gentleman. He is paralyzed from the waist down. His head exam shows no ocular or oral lesions. He has no palpable cervical or supraclavicular lymph nodes. His lungs are clear. Cardiac exam regular rate and rhythm with no murmurs, rubs or bruits. Abdomen is soft. Has good bowel  sounds. There is no fluid wave. He has a well-healed laparotomy scar is. He has no obvious abdominal mass. There is no obvious liver or spleen tip. Back exam shows a laminectomy scar in the neck and upper thoracic spine. Extremities shows no swelling in his lower legs. He has no strength secondary to the paralysis. Neurological exam is nonfocal. Skin exam shows no rashes.  Lab Results  Component Value Date   WBC 6.7 05/20/2015   HGB 15.1 05/20/2015   HCT 45.1 05/20/2015   MCV 84 05/20/2015   PLT 245 05/20/2015     Chemistry      Component Value Date/Time   NA 138 04/22/2015 0908   NA 144 08/22/2014 0415   K 3.7 04/22/2015 0908   K 4.2 08/22/2014 0415   CL 99 04/22/2015 0908   CL 106 08/22/2014 0415   CO2 30 04/22/2015 0908   CO2 27 08/22/2014 0415   BUN 20 04/22/2015 0908   BUN 11 08/22/2014 0415   CREATININE 0.7 04/22/2015 0908   CREATININE 0.82 08/22/2014 0415      Component Value Date/Time   CALCIUM 9.2 04/22/2015 0908   CALCIUM 8.8 08/22/2014 0415   ALKPHOS 132* 04/22/2015 0908   ALKPHOS 118* 08/20/2014 0412   AST 29 04/22/2015 0908   AST 18 08/20/2014 0412   ALT 24 04/22/2015 0908   ALT 12 08/20/2014 0412   BILITOT 0.70 04/22/2015 0908   BILITOT 0.3 08/20/2014 0412         Impression and Plan: [name] is 54 year old gentleman with von Hippel-Lindau syndrome. He has multiple neuroendocrine tumors. 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 He's had multiple spinal surgeries.  For now, we will just continue to watch him. Again, he is asymptomatic. His disease has progressed minimally.  I probably would not do another scan on him until August..  He will get his Somatuline today.  I'll plan to see him back in one month.  Scott Napoleon, MD 6/14/201612:44 PM

## 2015-05-23 LAB — CHROMOGRANIN A: Chromogranin A: 14 ng/mL (ref <=15)

## 2015-06-02 ENCOUNTER — Other Ambulatory Visit: Payer: Self-pay

## 2015-06-10 ENCOUNTER — Other Ambulatory Visit: Payer: Self-pay | Admitting: Hematology & Oncology

## 2015-06-12 ENCOUNTER — Telehealth: Payer: Self-pay | Admitting: *Deleted

## 2015-06-12 ENCOUNTER — Other Ambulatory Visit (HOSPITAL_BASED_OUTPATIENT_CLINIC_OR_DEPARTMENT_OTHER): Payer: 59

## 2015-06-12 ENCOUNTER — Other Ambulatory Visit: Payer: Self-pay | Admitting: *Deleted

## 2015-06-12 DIAGNOSIS — C7A1 Malignant poorly differentiated neuroendocrine tumors: Secondary | ICD-10-CM

## 2015-06-12 DIAGNOSIS — C7A8 Other malignant neuroendocrine tumors: Secondary | ICD-10-CM

## 2015-06-12 DIAGNOSIS — N390 Urinary tract infection, site not specified: Secondary | ICD-10-CM

## 2015-06-12 LAB — URINALYSIS, MICROSCOPIC (CHCC SATELLITE)
BILIRUBIN (URINE): NEGATIVE
Glucose: NEGATIVE mg/dL
Ketones: NEGATIVE mg/dL
Nitrite: POSITIVE
PH: 8.5 (ref 4.60–8.00)
PROTEIN: 100 mg/dL
Specific Gravity, Urine: 1.005 (ref 1.003–1.035)
Urobilinogen, UR: 0.2 mg/dL (ref 0.2–1)

## 2015-06-12 MED ORDER — LEVOFLOXACIN 500 MG PO TABS: 500.0000 mg | ORAL_TABLET | Freq: Every day | ORAL | Status: DC

## 2015-06-12 NOTE — Telephone Encounter (Signed)
Received UA results. Dr Marin Olp ordering antibiotics. Spoke to wife and she is aware.

## 2015-06-12 NOTE — Telephone Encounter (Signed)
Wife stating patient doesn't feel well and she thinks he might have a UTI. She would like to bring in a urine specimen to have analyzed. Dr Marin Olp gave orders for a UA/C&S. Wife will bring in specimen this morning.

## 2015-06-15 LAB — URINE CULTURE

## 2015-06-17 ENCOUNTER — Other Ambulatory Visit (HOSPITAL_BASED_OUTPATIENT_CLINIC_OR_DEPARTMENT_OTHER): Payer: 59

## 2015-06-17 ENCOUNTER — Encounter: Payer: Self-pay | Admitting: Hematology & Oncology

## 2015-06-17 ENCOUNTER — Ambulatory Visit (HOSPITAL_BASED_OUTPATIENT_CLINIC_OR_DEPARTMENT_OTHER): Payer: 59 | Admitting: Hematology & Oncology

## 2015-06-17 ENCOUNTER — Ambulatory Visit (HOSPITAL_BASED_OUTPATIENT_CLINIC_OR_DEPARTMENT_OTHER): Payer: 59

## 2015-06-17 VITALS — BP 106/81 | HR 87 | Temp 94.7°F

## 2015-06-17 DIAGNOSIS — C7A8 Other malignant neuroendocrine tumors: Secondary | ICD-10-CM

## 2015-06-17 DIAGNOSIS — C7B8 Other secondary neuroendocrine tumors: Secondary | ICD-10-CM

## 2015-06-17 DIAGNOSIS — N319 Neuromuscular dysfunction of bladder, unspecified: Secondary | ICD-10-CM

## 2015-06-17 LAB — CBC WITH DIFFERENTIAL (CANCER CENTER ONLY)
BASO#: 0 10*3/uL (ref 0.0–0.2)
BASO%: 0.5 % (ref 0.0–2.0)
EOS%: 1.2 % (ref 0.0–7.0)
Eosinophils Absolute: 0.1 10*3/uL (ref 0.0–0.5)
HEMATOCRIT: 47.1 % (ref 38.7–49.9)
HGB: 15.5 g/dL (ref 13.0–17.1)
LYMPH#: 1.4 10*3/uL (ref 0.9–3.3)
LYMPH%: 24.3 % (ref 14.0–48.0)
MCH: 28.2 pg (ref 28.0–33.4)
MCHC: 32.9 g/dL (ref 32.0–35.9)
MCV: 86 fL (ref 82–98)
MONO#: 0.5 10*3/uL (ref 0.1–0.9)
MONO%: 9.3 % (ref 0.0–13.0)
NEUT%: 64.7 % (ref 40.0–80.0)
NEUTROS ABS: 3.7 10*3/uL (ref 1.5–6.5)
Platelets: 226 10*3/uL (ref 145–400)
RBC: 5.5 10*6/uL (ref 4.20–5.70)
RDW: 12.7 % (ref 11.1–15.7)
WBC: 5.7 10*3/uL (ref 4.0–10.0)

## 2015-06-17 LAB — CMP (CANCER CENTER ONLY)
ALBUMIN: 3.2 g/dL — AB (ref 3.3–5.5)
ALT: 31 U/L (ref 10–47)
AST: 33 U/L (ref 11–38)
Alkaline Phosphatase: 158 U/L — ABNORMAL HIGH (ref 26–84)
BILIRUBIN TOTAL: 0.6 mg/dL (ref 0.20–1.60)
BUN: 20 mg/dL (ref 7–22)
CO2: 29 meq/L (ref 18–33)
Calcium: 9.6 mg/dL (ref 8.0–10.3)
Chloride: 97 mEq/L — ABNORMAL LOW (ref 98–108)
Creat: 1 mg/dl (ref 0.6–1.2)
Glucose, Bld: 301 mg/dL — ABNORMAL HIGH (ref 73–118)
POTASSIUM: 4.3 meq/L (ref 3.3–4.7)
Sodium: 135 mEq/L (ref 128–145)
Total Protein: 7.3 g/dL (ref 6.4–8.1)

## 2015-06-17 MED ORDER — LANREOTIDE ACETATE 120 MG/0.5ML ~~LOC~~ SOLN
120.0000 mg | Freq: Once | SUBCUTANEOUS | Status: AC
  Administered 2015-06-17: 120 mg via SUBCUTANEOUS
  Filled 2015-06-17: qty 120

## 2015-06-17 NOTE — Patient Instructions (Signed)
Lanreotide injection What is this medicine? LANREOTIDE (lan REE oh tide) is used to reduce blood levels of growth hormone in patients with a condition called acromegaly. It also works to slow or stop tumor growth in patients with gastroenteropancreatic neuroendocrine tumor (GEP-NET). This medicine may be used for other purposes; ask your health care provider or pharmacist if you have questions. COMMON BRAND NAME(S): Somatuline [place] What should I tell my health care provider before I take this medicine? They need to know if you have any of these conditions: -diabetes -gallbladder disease -heart disease -kidney disease -liver disease -an unusual or allergic reaction to lanreotide, other medicines, latex, foods, dyes, or preservatives -pregnant or trying to get pregnant -breast-feeding How should I use this medicine? This medicine is for injection under the skin. It is given by a health care professional in a hospital or clinic setting. Contact your pediatrician or health care professional regarding the use of this medicine in children. Special care may be needed. Overdosage: If you think you have taken too much of this medicine contact a poison control center or emergency room at once. NOTE: This medicine is only for you. Do not share this medicine with others. What if I miss a dose? It is important not to miss your dose. Call your doctor or health care professional if you are unable to keep an appointment. What may interact with this medicine? -bromocriptine -cyclosporine -medicines for diabetes, including insulin -medicines for heart disease or hypertension -quinidine This list may not describe all possible interactions. Give your health care provider a list of all the medicines, herbs, non-prescription drugs, or dietary supplements you use. Also tell them if you smoke, drink alcohol, or use illegal drugs. Some items may interact with your medicine. What should I watch for while using  this medicine? Visit your doctor or health care professional for regular checks on your progress. Your condition will be monitored carefully while you are receiving this medicine. This medicine may cause increases or decreases in blood sugar. Signs of high blood sugar include frequent urination, unusual thirst, flushed or dry skin, difficulty breathing, drowsiness, stomach ache, nausea, vomiting or dry mouth. Signs of low blood sugar include chills, cool, pale skin or cold sweats, drowsiness, extreme hunger, fast heartbeat, headache, nausea, nervousness or anxiety, shakiness, trembling, unsteadiness, tiredness, or weakness. Contact your doctor or health care professional right away if you experience any of these symptoms. What side effects may I notice from receiving this medicine? Side effects that you should report to your doctor or health care professional as soon as possible: -allergic reactions like skin rash, itching or hives, swelling of the face, lips, or tongue -changes in blood sugar -changes in heart rate -severe stomach pain Side effects that usually do not require medical attention (report to your doctor or health care professional if they continue or are bothersome): -diarrhea or constipation -gas or stomach pain -nausea, vomiting -pain, redness, swelling and irritation at site where injected This list may not describe all possible side effects. Call your doctor for medical advice about side effects. You may report side effects to FDA at 1-800-FDA-1088. Where should I keep my medicine? This drug is given in a hospital or clinic and will not be stored at home. NOTE: This sheet is a summary. It may not cover all possible information. If you have questions about this medicine, talk to your doctor, pharmacist, or health care provider.  2015, Elsevier/Gold Standard. (2013-11-21 17:43:04)

## 2015-06-17 NOTE — Progress Notes (Signed)
Hematology and Oncology Follow Up Visit  [name] [id number] 08-28-1961 54 y.o. 06/17/2015   Principle Diagnosis:   Metastatic neuroendocrine carcinoma  Von Hipple-Lindau Syndrome  Current Therapy:   Somatuline 120mg  SQ q month     Interim History:  [name] is back for followup.he looks quite good. He has had no promises we last saw him. We'll issue that he has ongoing is a fact that he has 2 bacteria in his urine. One is Proteus and one is Escherichia coli. They're both sensitive to Levaquin. He has a suprapubic catheter that is the source of these infections. 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  His blood sugars are on the high side. This too happens when he has a urinary tract infection.  His chromogranin A level was 14 we saw him back in June. This is holding pretty steady.  He still has intermittent diarrhea. He has some abdominal spasms. These seemed to respond to baclofen.  He has had issues with blood pressure elevations. He is on Cardura when he has these blood pressure elevations.  As always, his wife is doing a great job taking care of him. She is truly dedicated to him and I think his wonderful that she has been able to be such a strength.  Overall, his performance status is ECOG 1.   Medications:  Current outpatient prescriptions:  .  baclofen (LIORESAL) 20 MG tablet, Take 20-40 mg by mouth 5 (five) times daily., Disp: , Rfl:  .  diazepam (VALIUM) 5 MG tablet, TAKE 1 TABLET BY MOUTH 3 TIMES DAILY AS NEEDED FOR ANXIETY OR MUSCLE SPASMS, Disp: 40 tablet, Rfl: 1 .  doxazosin (CARDURA) 1 MG tablet, Take 1 tablet (1 mg total) by mouth 2 (two) times daily., Disp: 60 tablet, Rfl: 2 .  dronabinol (MARINOL) 5 MG capsule, TAKE 1 CAPSULE BY MOUTH TWICE A DAY WITH MEALS, Disp: 60 capsule, Rfl: 1 .  ergocalciferol (VITAMIN D2) 50000 UNITS capsule, Take 50,000 Units by mouth once a week. On Fridays, Disp: , Rfl:  .  fluconazole (DIFLUCAN) 100  MG tablet, Take 1 tablet (100 mg total) by mouth daily., Disp: 30 tablet, Rfl: 2 .  HYDROcodone-acetaminophen (NORCO/VICODIN) 5-325 MG per tablet, Take 1 tablet by mouth every 6 (six) hours as needed for moderate pain., Disp: 90 tablet, Rfl: 0 .  HYDROmorphone (DILAUDID) 4 MG tablet, Take 4 mg by mouth every 6 (six) hours as needed for severe pain., Disp: , Rfl:  .  insulin glargine (LANTUS) 100 UNIT/ML injection, Inject 25 Units into the skin daily before breakfast. , Disp: , Rfl:  .  insulin lispro (HUMALOG) 100 UNIT/ML injection, Inject 3-9 Units into the skin 3 (three) times daily before meals. SS, Disp: , Rfl:  .  levofloxacin (LEVAQUIN) 500 MG tablet, Take 1 tablet (500 mg total) by mouth daily. For 5 days, Disp: 5 tablet, Rfl: 1 .  midodrine (PROAMATINE) 5 MG tablet, Take 2 tablets (10 mg total) by mouth 2 (two) times daily as needed (Prior to ambulation)., Disp: , Rfl:  .  Pancrelipase, Lip-Prot-Amyl, 24000 UNITS CPEP, Take 3 capsules by mouth 3 (three) times daily with meals., Disp: , Rfl:  .  polyethylene glycol (MIRALAX / GLYCOLAX) packet, Take 17 g by mouth daily., Disp: 30 each, Rfl: 0 .  promethazine (PHENERGAN) 12.5 MG tablet, Take 12.5 mg by mouth every 6 (six) hours as needed for nausea. , Disp: , Rfl:  .  tiZANidine (ZANAFLEX)  4 MG tablet, TAKE 2 TABLETS BY MOUTH EVERY 6 HOURS AS NEEDED FOR PAIN, Disp: 30 tablet, Rfl: 3 .  traMADol (ULTRAM) 50 MG tablet, Take 1 tablet (50 mg total) by mouth 3 (three) times daily., Disp: 90 tablet, Rfl: 2  Allergies:  Allergies  Allergen Reactions  . Ciprocin-Fluocin-Procin [Fluocinolone Acetonide] Rash    Pt states this happened with IV Cipro. ABLE TO TAKE PO CIPRO.  . Other Rash    IV-CIPRO    Past Medical History, Surgical history, Social history, and Family History were reviewed and updated.  Review of Systems: As above  Physical Exam:  oral temperature is 94.7 F (34.8 C). His blood pressure is 106/81 and his pulse is 87.    Well-developed and well-nourished gentleman. He is paralyzed from the waist down. His head exam shows no ocular or oral lesions. He has no palpable cervical or supraclavicular lymph nodes. His lungs are clear. Cardiac exam regular rate and rhythm with no murmurs, rubs or bruits. Abdomen is soft. Has good bowel sounds. There is no fluid wave. He has a well-healed laparotomy scar is. He has no obvious abdominal mass. There is no obvious liver or spleen tip. Back exam shows a laminectomy scar in the neck and upper thoracic spine. Extremities shows no swelling in his lower legs. He has no strength secondary to the paralysis. Neurological exam is nonfocal. Skin exam shows no rashes.  Lab Results  Component Value Date   WBC 5.7 06/17/2015   HGB 15.5 06/17/2015   HCT 47.1 06/17/2015   MCV 86 06/17/2015   PLT 226 06/17/2015     Chemistry      Component Value Date/Time   NA 135 06/17/2015 0916   NA 137 05/20/2015 0901   K 4.3 06/17/2015 0916   K 4.1 05/20/2015 0901   CL 97* 06/17/2015 0916   CL 99 05/20/2015 0901   CO2 29 06/17/2015 0916   CO2 28 05/20/2015 0901   BUN 20 06/17/2015 0916   BUN 19 05/20/2015 0901   CREATININE 1.0 06/17/2015 0916   CREATININE 0.67 05/20/2015 0901      Component Value Date/Time   CALCIUM 9.6 06/17/2015 0916   CALCIUM 9.0 05/20/2015 0901   ALKPHOS 158* 06/17/2015 0916   ALKPHOS 150* 05/20/2015 0901   AST 33 06/17/2015 0916   AST 33 05/20/2015 0901   ALT 31 06/17/2015 0916   ALT 23 05/20/2015 0901   BILITOT 0.60 06/17/2015 0916   BILITOT 0.5 05/20/2015 0901         Impression and Plan: [name] is 54 year old gentleman with von Hippel-Lindau syndrome. He has multiple neuroendocrine tumors. 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 He's had multiple spinal surgeries.  For now, we will just continue to watch him. The Somatuline I think we will continue to be helpful .  We will set him up with CAT scans in August.  I  worry about the urinary tract infections. Fortunately, the bacteria that he has now in his urine are sensitive to several oral antibiotics.,  I'll plan to see him back in one month.  Volanda Napoleon, MD 7/12/201610:20 AM

## 2015-06-18 ENCOUNTER — Other Ambulatory Visit: Payer: Self-pay | Admitting: Hematology & Oncology

## 2015-06-20 LAB — CHROMOGRANIN A: CHROMOGRANIN A: 24 ng/mL — AB (ref <=15)

## 2015-06-23 ENCOUNTER — Other Ambulatory Visit: Payer: Self-pay | Admitting: Hematology & Oncology

## 2015-07-22 ENCOUNTER — Ambulatory Visit (HOSPITAL_BASED_OUTPATIENT_CLINIC_OR_DEPARTMENT_OTHER)
Admission: RE | Admit: 2015-07-22 | Discharge: 2015-07-22 | Disposition: A | Discharge status: Home / Self Care | Payer: 59 | Source: Ambulatory Visit | Attending: Hematology & Oncology | Admitting: Hematology & Oncology

## 2015-07-22 ENCOUNTER — Encounter: Payer: Self-pay | Admitting: Family

## 2015-07-22 ENCOUNTER — Ambulatory Visit (HOSPITAL_BASED_OUTPATIENT_CLINIC_OR_DEPARTMENT_OTHER): Payer: 59 | Admitting: Family

## 2015-07-22 ENCOUNTER — Other Ambulatory Visit (HOSPITAL_BASED_OUTPATIENT_CLINIC_OR_DEPARTMENT_OTHER): Payer: 59

## 2015-07-22 ENCOUNTER — Ambulatory Visit (HOSPITAL_BASED_OUTPATIENT_CLINIC_OR_DEPARTMENT_OTHER): Payer: 59

## 2015-07-22 ENCOUNTER — Encounter (HOSPITAL_BASED_OUTPATIENT_CLINIC_OR_DEPARTMENT_OTHER): Payer: Self-pay

## 2015-07-22 VITALS — BP 107/75 | HR 78 | Temp 97.4°F | Resp 16 | Ht 69.0 in

## 2015-07-22 DIAGNOSIS — C7A8 Other malignant neuroendocrine tumors: Secondary | ICD-10-CM

## 2015-07-22 DIAGNOSIS — N319 Neuromuscular dysfunction of bladder, unspecified: Secondary | ICD-10-CM

## 2015-07-22 DIAGNOSIS — C7B8 Other secondary neuroendocrine tumors: Secondary | ICD-10-CM | POA: Diagnosis not present

## 2015-07-22 DIAGNOSIS — C7889 Secondary malignant neoplasm of other digestive organs: Secondary | ICD-10-CM | POA: Diagnosis not present

## 2015-07-22 DIAGNOSIS — C7A1 Malignant poorly differentiated neuroendocrine tumors: Secondary | ICD-10-CM | POA: Diagnosis not present

## 2015-07-22 DIAGNOSIS — Q8583 Von Hippel-Lindau syndrome: Secondary | ICD-10-CM

## 2015-07-22 DIAGNOSIS — Q858 Other phakomatoses, not elsewhere classified: Secondary | ICD-10-CM | POA: Diagnosis not present

## 2015-07-22 DIAGNOSIS — J9811 Atelectasis: Secondary | ICD-10-CM | POA: Diagnosis not present

## 2015-07-22 DIAGNOSIS — C787 Secondary malignant neoplasm of liver and intrahepatic bile duct: Secondary | ICD-10-CM | POA: Insufficient documentation

## 2015-07-22 LAB — CBC WITH DIFFERENTIAL (CANCER CENTER ONLY)
BASO#: 0 10*3/uL (ref 0.0–0.2)
BASO%: 0.4 % (ref 0.0–2.0)
EOS ABS: 0.1 10*3/uL (ref 0.0–0.5)
EOS%: 1.2 % (ref 0.0–7.0)
HEMATOCRIT: 46.9 % (ref 38.7–49.9)
HGB: 15.7 g/dL (ref 13.0–17.1)
LYMPH#: 1.4 10*3/uL (ref 0.9–3.3)
LYMPH%: 19 % (ref 14.0–48.0)
MCH: 27.9 pg — ABNORMAL LOW (ref 28.0–33.4)
MCHC: 33.5 g/dL (ref 32.0–35.9)
MCV: 84 fL (ref 82–98)
MONO#: 0.6 10*3/uL (ref 0.1–0.9)
MONO%: 7.7 % (ref 0.0–13.0)
NEUT#: 5.4 10*3/uL (ref 1.5–6.5)
NEUT%: 71.7 % (ref 40.0–80.0)
Platelets: 236 10*3/uL (ref 145–400)
RBC: 5.62 10*6/uL (ref 4.20–5.70)
RDW: 13.1 % (ref 11.1–15.7)
WBC: 7.5 10*3/uL (ref 4.0–10.0)

## 2015-07-22 LAB — CMP (CANCER CENTER ONLY)
ALK PHOS: 150 U/L — AB (ref 26–84)
ALT: 27 U/L (ref 10–47)
AST: 45 U/L — ABNORMAL HIGH (ref 11–38)
Albumin: 3.8 g/dL (ref 3.3–5.5)
BUN, Bld: 20 mg/dL (ref 7–22)
CO2: 28 mEq/L (ref 18–33)
CREATININE: 0.9 mg/dL (ref 0.6–1.2)
Calcium: 9.4 mg/dL (ref 8.0–10.3)
Chloride: 96 mEq/L — ABNORMAL LOW (ref 98–108)
GLUCOSE: 272 mg/dL — AB (ref 73–118)
POTASSIUM: 4.2 meq/L (ref 3.3–4.7)
Sodium: 136 mEq/L (ref 128–145)
TOTAL PROTEIN: 7.9 g/dL (ref 6.4–8.1)
Total Bilirubin: 0.9 mg/dl (ref 0.20–1.60)

## 2015-07-22 MED ORDER — EVEROLIMUS 10 MG PO TABS
10.0000 mg | ORAL_TABLET | Freq: Every day | ORAL | Status: DC
Start: 2015-07-22 — End: 2016-02-02

## 2015-07-22 MED ORDER — IOHEXOL 300 MG/ML  SOLN
100.0000 mL | Freq: Once | INTRAMUSCULAR | Status: AC | PRN
  Administered 2015-07-22: 100 mL via INTRAVENOUS

## 2015-07-22 MED ORDER — LANREOTIDE ACETATE 120 MG/0.5ML ~~LOC~~ SOLN
120.0000 mg | Freq: Once | SUBCUTANEOUS | Status: AC
  Administered 2015-07-22: 120 mg via SUBCUTANEOUS
  Filled 2015-07-22: qty 120

## 2015-07-22 NOTE — Progress Notes (Signed)
Hematology and Oncology Follow Up Visit  [name] [id number] 03-31-1961 54 y.o. 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 He is having some sharp shooting pains in his right arm at times. He doe have muscle spasms and is taking his Baclofen.  His urinary infection has resolved. He did have his suprapubic catheter changed out 2 weeks ago and this has made a big difference for him. He has not noticed any blood in his urine.  He denies fever, chills, n/v, cough, rash, SOB, chest pain, palpitations, no blood in his stool. He has not had a BM in 2 days. He plans to use stool softeners once he gets home. He did not want to take anything before he came to his appointment today.  No lymphadenopathy found on exam.  He is eating well and staying hydrated.  He has had some mild abdominal discomfort that comes and goes. His most recent Chromogranin A in July was 24. We did recheck this today and the results are pending.  His blood sugar is up today . He did eat a snack before his CT scan this morning.  His chest CT was negative for metastasis. His abd/pelvis showed progression of hepatic metastasis with interval enlargement of enhancing lesions in the right hepatic lobe. Essentially stable enhancing LEFT renal masses and stable enhancing lesions in the spinal canal. 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    Medications:    Medication List       This list is accurate as of: 07/22/15  9:53 AM.  Always use your most recent med list.               baclofen 20 MG tablet  Commonly known as:  LIORESAL  Take 20-40 mg by mouth 5 (five) times daily.     diazepam 5 MG tablet  Commonly known as:  VALIUM  TAKE 1 TABLET BY  MOUTH 3 TIMES DAILY AS NEEDED FOR MUSCLE SPASMS/ANXIETY     doxazosin 1 MG tablet  Commonly known as:  CARDURA  Take 1 tablet (1 mg total) by mouth 2 (two) times daily.     dronabinol 5 MG capsule  Commonly known as:  MARINOL  TAKE 1 CAPSULE BY MOUTH TWICE A DAY WITH MEALS     ergocalciferol 50000 UNITS capsule  Commonly known as:  VITAMIN D2  Take 50,000 Units by mouth once a week. On Fridays     fluconazole 100 MG tablet  Commonly known as:  DIFLUCAN  Take 1 tablet (100 mg total) by mouth daily.     HYDROcodone-acetaminophen 5-325 MG per tablet  Commonly known as:  NORCO/VICODIN  Take 1 tablet by mouth every 6 (six) hours as needed for moderate pain.     HYDROmorphone 4 MG tablet  Commonly known as:  DILAUDID  Take 4 mg by mouth every 6 (six) hours as needed for severe pain.     insulin glargine 100 UNIT/ML injection  Commonly known as:  LANTUS  Inject 25 Units into the skin daily before breakfast.     insulin lispro 100 UNIT/ML injection  Commonly known as:  HUMALOG  Inject 3-9 Units into the skin 3 (three) times daily before  meals. SS     levofloxacin 500 MG tablet  Commonly known as:  LEVAQUIN  Take 1 tablet (500 mg total) by mouth daily. For 5 days     midodrine 5 MG tablet  Commonly known as:  PROAMATINE  Take 2 tablets (10 mg total) by mouth 2 (two) times daily as needed (Prior to ambulation).     Pancrelipase (Lip-Prot-Amyl) 24000 UNITS Cpep  Take 3 capsules by mouth 3 (three) times daily with meals.     polyethylene glycol packet  Commonly known as:  MIRALAX / GLYCOLAX  Take 17 g by mouth daily.     promethazine 12.5 MG tablet  Commonly known as:  PHENERGAN  Take 12.5 mg by mouth every 6 (six) hours as needed for nausea.     tiZANidine 4 MG tablet  Commonly known as:  ZANAFLEX  TAKE 2 TABLETS BY MOUTH EVERY 6 HOURS AS NEEDED FOR PAIN     traMADol 50 MG tablet  Commonly known as:  ULTRAM  TAKE 1 TABLET BY MOUTH 3 TIMES DAILY        Allergies:    Allergies  Allergen Reactions  . Ciprocin-Fluocin-Procin [Fluocinolone Acetonide] Rash    Pt states this happened with IV Cipro. ABLE TO TAKE PO CIPRO.  . Other Rash    IV-CIPRO    Past Medical History, Surgical history, Social history, and Family History were reviewed and updated.  Review of Systems: All other 10 point review of systems is negative.   Physical Exam:  vitals were not taken for this visit.  Wt Readings from Last 3 Encounters:  08/23/14 149 lb 7.6 oz (67.8 kg)  05/14/14 162 lb 4.1 oz (73.6 kg)  02/04/14 150 lb (68.04 kg)    Ocular: Sclerae unicteric, pupils equal, round and reactive to light Ear-nose-throat: Oropharynx clear, dentition fair Lymphatic: No cervical or supraclavicular adenopathy Lungs no rales or rhonchi, good excursion bilaterally Heart regular rate and rhythm, no murmur appreciated Abd soft, nontender, positive bowel sounds MSK no focal spinal tenderness, no joint edema Neuro: non-focal, well-oriented, appropriate affect Breasts: Deferred  Lab Results  Component Value Date   WBC 5.7 06/17/2015   HGB 15.5 06/17/2015   HCT 47.1 06/17/2015   MCV 86 06/17/2015   PLT 226 06/17/2015   Lab Results  Component Value Date   FERRITIN 31 07/09/2014   IRON 35* 07/09/2014   TIBC 345 07/09/2014   UIBC 310 07/09/2014   IRONPCTSAT 10* 07/09/2014   Lab Results  Component Value Date   RETICCTPCT 0.4 03/21/2009   RBC 5.50 06/17/2015   RETICCTABS 18.8* 03/21/2009   No results found for: KPAFRELGTCHN, LAMBDASER, KAPLAMBRATIO No results found for: IGGSERUM, IGA, IGMSERUM No results found for: Kathrynn Ducking, MSPIKE, SPEI   Chemistry      Component Value Date/Time   NA 135 06/17/2015 0916   NA 137 05/20/2015 0901   K 4.3 06/17/2015 0916   K 4.1 05/20/2015 0901   CL 97* 06/17/2015 0916   CL 99 05/20/2015 0901   CO2 29 06/17/2015 0916   CO2 28 05/20/2015 0901   BUN 20 06/17/2015 0916   BUN 19  05/20/2015 0901   CREATININE 1.0 06/17/2015 0916   CREATININE 0.67 05/20/2015 0901      Component Value Date/Time   CALCIUM 9.6 06/17/2015 0916   CALCIUM 9.0 05/20/2015 0901   ALKPHOS 158* 06/17/2015 0916   ALKPHOS 150* 05/20/2015 0901   AST 33 06/17/2015 0916   AST  33 05/20/2015 0901   ALT 31 06/17/2015 0916   ALT 23 05/20/2015 0901   BILITOT 0.60 06/17/2015 0916   BILITOT 0.5 05/20/2015 0901     Impression and Plan: [name] is a very pleasant 54 yo gentleman with von Hippel-Lindau syndrome. He has multiple neuroendocrine tumors and is paralyzed due to spinal cord involvement of malignancy. He has had multiple spinal surgeries. His Ct scan today showed progression of right hepatic metastasis. He also had stable enhancing left renal mets and stable enhancing lesions of the spinal canal. We will start him on Afinitor 10 mg daily per NCCN guidelines.  He will also continue on the monthly Somatuline injections receiving one today.  Thankfully his urinary infection appears to have resolved with antibiotics and having his suprapubic catheter changed.  We will plan to see him back in 1 month for labs, follow-up and injection.   This was a shared visit with Dr. Marin Olp and he is in agreement with the aforementioned.   Eliezer Bottom, NP 8/16/20169:53 AM   Addendum:  I saw and examined the patient. Unfortunately, it looks like he does have progressive disease on the CT scan. I think that he is already received pretty much the most radiation that he can receive to the liver.  I think that Afinitor would be reasonable. I think that he could tolerate this. There does seem to be a relatively decent response rate.  I talked to him about this. I told him about the side effects, mostly which include mucositis. However, a recent study showed that Decadron mouth rinses seem to help with mucositis from Afinitor.  He is having some issues with his head. He does have known brain metastases.  I think an MRI of the brain probably would not be a bad idea area and I think he would feel better having this.  He has done incredibly well. Hopefully, we will find that he is responding.  We will continue him on the Somatuline.  I will plan to see him back in one month, or sooner depending on any side effects.  Laurey Arrow

## 2015-07-25 LAB — LACTATE DEHYDROGENASE: LDH: 156 U/L (ref 94–250)

## 2015-07-25 LAB — CHROMOGRANIN A: Chromogranin A: 14 ng/mL (ref <=15)

## 2015-08-04 ENCOUNTER — Ambulatory Visit (HOSPITAL_COMMUNITY)
Admission: RE | Admit: 2015-08-04 | Discharge: 2015-08-04 | Disposition: A | Discharge status: Home / Self Care | Payer: 59 | Source: Ambulatory Visit | Attending: Hematology & Oncology | Admitting: Hematology & Oncology

## 2015-08-04 DIAGNOSIS — C7A8 Other malignant neuroendocrine tumors: Secondary | ICD-10-CM | POA: Diagnosis not present

## 2015-08-04 DIAGNOSIS — Q858 Other phakomatoses, not elsewhere classified: Secondary | ICD-10-CM | POA: Diagnosis not present

## 2015-08-04 DIAGNOSIS — Q8583 Von Hippel-Lindau syndrome: Secondary | ICD-10-CM

## 2015-08-04 DIAGNOSIS — G936 Cerebral edema: Secondary | ICD-10-CM | POA: Insufficient documentation

## 2015-08-04 DIAGNOSIS — R220 Localized swelling, mass and lump, head: Secondary | ICD-10-CM | POA: Diagnosis not present

## 2015-08-04 DIAGNOSIS — G939 Disorder of brain, unspecified: Secondary | ICD-10-CM | POA: Diagnosis not present

## 2015-08-04 MED ORDER — GADOBENATE DIMEGLUMINE 529 MG/ML IV SOLN
15.0000 mL | Freq: Once | INTRAVENOUS | Status: AC | PRN
  Administered 2015-08-04: 13 mL via INTRAVENOUS

## 2015-08-08 ENCOUNTER — Telehealth: Payer: Self-pay | Admitting: Hematology & Oncology

## 2015-08-08 NOTE — Telephone Encounter (Signed)
FMLA papers completed and faxed to pt's wife [name]) @  [phone].      COPY SCANNED

## 2015-08-12 ENCOUNTER — Other Ambulatory Visit: Payer: Self-pay | Admitting: *Deleted

## 2015-08-12 MED ORDER — DRONABINOL 5 MG PO CAPS: 5.0000 mg | ORAL_CAPSULE | Freq: Two times a day (BID) | ORAL | Status: DC

## 2015-08-13 ENCOUNTER — Other Ambulatory Visit: Payer: Self-pay | Admitting: Hematology & Oncology

## 2015-08-15 ENCOUNTER — Inpatient Hospital Stay
Admission: RE | Admit: 2015-08-15 | Discharge: 2015-08-15 | Disposition: A | Discharge status: Home / Self Care | Payer: Self-pay | Source: Ambulatory Visit | Attending: Radiation Oncology | Admitting: Radiation Oncology

## 2015-08-15 ENCOUNTER — Other Ambulatory Visit: Payer: Self-pay | Admitting: Radiation Oncology

## 2015-08-15 DIAGNOSIS — D499 Neoplasm of unspecified behavior of unspecified site: Secondary | ICD-10-CM

## 2015-08-17 ENCOUNTER — Encounter: Payer: Self-pay | Admitting: Hematology & Oncology

## 2015-08-18 ENCOUNTER — Other Ambulatory Visit: Payer: Self-pay | Admitting: Hematology & Oncology

## 2015-08-21 ENCOUNTER — Other Ambulatory Visit: Payer: Self-pay | Admitting: *Deleted

## 2015-08-21 DIAGNOSIS — N390 Urinary tract infection, site not specified: Secondary | ICD-10-CM

## 2015-08-21 MED ORDER — LEVOFLOXACIN 500 MG PO TABS
500.0000 mg | ORAL_TABLET | Freq: Every day | ORAL | Status: DC
Start: 2015-08-21 — End: 2015-09-02

## 2015-08-26 ENCOUNTER — Ambulatory Visit: Payer: 59

## 2015-08-26 ENCOUNTER — Telehealth: Payer: Self-pay | Admitting: Hematology & Oncology

## 2015-08-26 ENCOUNTER — Other Ambulatory Visit: Payer: 59

## 2015-08-26 ENCOUNTER — Ambulatory Visit: Payer: 59 | Admitting: Hematology & Oncology

## 2015-08-26 NOTE — Telephone Encounter (Signed)
Per Rn to cx 08/26/15 apt and resch for next week.  appt was cx and resch for 09/02/15.  i called and spoke with wife Wendi and notified her of cx apt and gave resch apt date/time.  Patient is aware of appt change and will be here 09/02/15

## 2015-09-02 ENCOUNTER — Ambulatory Visit (HOSPITAL_BASED_OUTPATIENT_CLINIC_OR_DEPARTMENT_OTHER): Payer: 59

## 2015-09-02 ENCOUNTER — Encounter: Payer: Self-pay | Admitting: Hematology & Oncology

## 2015-09-02 ENCOUNTER — Ambulatory Visit (HOSPITAL_BASED_OUTPATIENT_CLINIC_OR_DEPARTMENT_OTHER): Payer: 59 | Admitting: Hematology & Oncology

## 2015-09-02 ENCOUNTER — Other Ambulatory Visit (HOSPITAL_BASED_OUTPATIENT_CLINIC_OR_DEPARTMENT_OTHER): Payer: 59

## 2015-09-02 VITALS — BP 104/64 | HR 81 | Temp 97.6°F | Resp 16 | Ht 69.0 in | Wt 164.0 lb

## 2015-09-02 DIAGNOSIS — C7B8 Other secondary neuroendocrine tumors: Secondary | ICD-10-CM

## 2015-09-02 DIAGNOSIS — Q8583 Von Hippel-Lindau syndrome: Secondary | ICD-10-CM

## 2015-09-02 DIAGNOSIS — Z23 Encounter for immunization: Secondary | ICD-10-CM | POA: Diagnosis not present

## 2015-09-02 DIAGNOSIS — C7A8 Other malignant neuroendocrine tumors: Secondary | ICD-10-CM

## 2015-09-02 DIAGNOSIS — Q858 Other phakomatoses, not elsewhere classified: Secondary | ICD-10-CM

## 2015-09-02 DIAGNOSIS — N319 Neuromuscular dysfunction of bladder, unspecified: Secondary | ICD-10-CM

## 2015-09-02 LAB — CBC WITH DIFFERENTIAL (CANCER CENTER ONLY)
BASO#: 0 10*3/uL (ref 0.0–0.2)
BASO%: 0.4 % (ref 0.0–2.0)
EOS ABS: 0.1 10*3/uL (ref 0.0–0.5)
EOS%: 1.3 % (ref 0.0–7.0)
HEMATOCRIT: 46.4 % (ref 38.7–49.9)
HEMOGLOBIN: 15 g/dL (ref 13.0–17.1)
LYMPH#: 1.5 10*3/uL (ref 0.9–3.3)
LYMPH%: 21.9 % (ref 14.0–48.0)
MCH: 25.6 pg — AB (ref 28.0–33.4)
MCHC: 32.3 g/dL (ref 32.0–35.9)
MCV: 79 fL — AB (ref 82–98)
MONO#: 0.6 10*3/uL (ref 0.1–0.9)
MONO%: 9 % (ref 0.0–13.0)
NEUT%: 67.4 % (ref 40.0–80.0)
NEUTROS ABS: 4.6 10*3/uL (ref 1.5–6.5)
Platelets: 263 10*3/uL (ref 145–400)
RBC: 5.87 10*6/uL — ABNORMAL HIGH (ref 4.20–5.70)
RDW: 12.5 % (ref 11.1–15.7)
WBC: 6.9 10*3/uL (ref 4.0–10.0)

## 2015-09-02 MED ORDER — HYDROCODONE-ACETAMINOPHEN 5-325 MG PO TABS: 1.0000 | ORAL_TABLET | Freq: Four times a day (QID) | ORAL | Status: DC | PRN

## 2015-09-02 MED ORDER — INFLUENZA VAC SPLIT QUAD 0.5 ML IM SUSY
0.5000 mL | PREFILLED_SYRINGE | Freq: Once | INTRAMUSCULAR | Status: AC
  Administered 2015-09-02: 0.5 mL via INTRAMUSCULAR
  Filled 2015-09-02: qty 0.5

## 2015-09-02 MED ORDER — LANREOTIDE ACETATE 120 MG/0.5ML ~~LOC~~ SOLN
120.0000 mg | Freq: Once | SUBCUTANEOUS | Status: AC
  Administered 2015-09-02: 120 mg via SUBCUTANEOUS
  Filled 2015-09-02: qty 120

## 2015-09-02 MED ORDER — MONTELUKAST SODIUM 10 MG PO TABS: 10.0000 mg | ORAL_TABLET | Freq: Every day | ORAL | Status: DC

## 2015-09-02 NOTE — Patient Instructions (Signed)
Influenza Virus Vaccine injection (Fluarix) What is this medicine? INFLUENZA VIRUS VACCINE (in floo EN zuh VAHY ruhs vak SEEN) helps to reduce the risk of getting influenza also known as the flu. This medicine may be used for other purposes; ask your health care [name] or pharmacist if you have questions. COMMON BRAND NAME(S): Fluarix, Fluzone What should I tell my health care [name] before I take this medicine? They need to know if you have any of these conditions: -bleeding disorder like hemophilia -fever or infection -Guillain-Barre syndrome or other neurological problems -immune system problems -infection with the human immunodeficiency virus (HIV) or AIDS -low blood platelet counts -multiple sclerosis -an unusual or allergic reaction to influenza virus vaccine, eggs, chicken proteins, latex, gentamicin, other medicines, foods, dyes or preservatives -pregnant or trying to get pregnant -breast-feeding How should I use this medicine? This vaccine is for injection into a muscle. It is given by a health care professional. A copy of Vaccine Information Statements will be given before each vaccination. Read this sheet carefully each time. The sheet may change frequently. Talk to your pediatrician regarding the use of this medicine in children. Special care may be needed. Overdosage: If you think you have taken too much of this medicine contact a poison control center or emergency room at once. NOTE: This medicine is only for you. Do not share this medicine with others. What if I miss a dose? This does not apply. What may interact with this medicine? -chemotherapy or radiation therapy -medicines that lower your immune system like etanercept, anakinra, infliximab, and adalimumab -medicines that treat or prevent blood clots like warfarin -phenytoin -steroid medicines like prednisone or cortisone -theophylline -vaccines This list may not describe all possible interactions. Give your  health care [name] a list of all the medicines, herbs, non-prescription drugs, or dietary supplements you use. Also tell them if you smoke, drink alcohol, or use illegal drugs. Some items may interact with your medicine. What should I watch for while using this medicine? Report any side effects that do not go away within 3 days to your doctor or health care professional. Call your health care [name] if any unusual symptoms occur within 6 weeks of receiving this vaccine. You may still catch the flu, but the illness is not usually as bad. You cannot get the flu from the vaccine. The vaccine will not protect against colds or other illnesses that may cause fever. The vaccine is needed every year. What side effects may I notice from receiving this medicine? Side effects that you should report to your doctor or health care professional as soon as possible: -allergic reactions like skin rash, itching or hives, swelling of the face, lips, or tongue Side effects that usually do not require medical attention (report to your doctor or health care professional if they continue or are bothersome): -fever -headache -muscle aches and pains -pain, tenderness, redness, or swelling at site where injected -weak or tired This list may not describe all possible side effects. Call your doctor for medical advice about side effects. You may report side effects to FDA at 1-800-FDA-1088. Where should I keep my medicine? This vaccine is only given in a clinic, pharmacy, doctor's office, or other health care setting and will not be stored at home. NOTE: This sheet is a summary. It may not cover all possible information. If you have questions about this medicine, talk to your doctor, pharmacist, or health care [name].  2015, Elsevier/Gold Standard. (2008-06-19 09:30:40) Lanreotide injection What is this  medicine? LANREOTIDE (lan REE oh tide) is used to reduce blood levels of growth hormone in patients with a condition  called acromegaly. It also works to slow or stop tumor growth in patients with gastroenteropancreatic neuroendocrine tumor (GEP-NET). This medicine may be used for other purposes; ask your health care [name] or pharmacist if you have questions. COMMON BRAND NAME(S): Somatuline Depot What should I tell my health care [name] before I take this medicine? They need to know if you have any of these conditions: -diabetes -gallbladder disease -heart disease -kidney disease -liver disease -an unusual or allergic reaction to lanreotide, other medicines, latex, foods, dyes, or preservatives -pregnant or trying to get pregnant -breast-feeding How should I use this medicine? This medicine is for injection under the skin. It is given by a health care professional in a hospital or clinic setting. Contact your pediatrician or health care professional regarding the use of this medicine in children. Special care may be needed. Overdosage: If you think you have taken too much of this medicine contact a poison control center or emergency room at once. NOTE: This medicine is only for you. Do not share this medicine with others. What if I miss a dose? It is important not to miss your dose. Call your doctor or health care professional if you are unable to keep an appointment. What may interact with this medicine? -bromocriptine -cyclosporine -medicines for diabetes, including insulin -medicines for heart disease or hypertension -quinidine This list may not describe all possible interactions. Give your health care [name] a list of all the medicines, herbs, non-prescription drugs, or dietary supplements you use. Also tell them if you smoke, drink alcohol, or use illegal drugs. Some items may interact with your medicine. What should I watch for while using this medicine? Visit your doctor or health care professional for regular checks on your progress. Your condition will be monitored carefully while you  are receiving this medicine. This medicine may cause increases or decreases in blood sugar. Signs of high blood sugar include frequent urination, unusual thirst, flushed or dry skin, difficulty breathing, drowsiness, stomach ache, nausea, vomiting or dry mouth. Signs of low blood sugar include chills, cool, pale skin or cold sweats, drowsiness, extreme hunger, fast heartbeat, headache, nausea, nervousness or anxiety, shakiness, trembling, unsteadiness, tiredness, or weakness. Contact your doctor or health care professional right away if you experience any of these symptoms. What side effects may I notice from receiving this medicine? Side effects that you should report to your doctor or health care professional as soon as possible: -allergic reactions like skin rash, itching or hives, swelling of the face, lips, or tongue -changes in blood sugar -changes in heart rate -severe stomach pain Side effects that usually do not require medical attention (report to your doctor or health care professional if they continue or are bothersome): -diarrhea or constipation -gas or stomach pain -nausea, vomiting -pain, redness, swelling and irritation at site where injected This list may not describe all possible side effects. Call your doctor for medical advice about side effects. You may report side effects to FDA at 1-800-FDA-1088. Where should I keep my medicine? This drug is given in a hospital or clinic and will not be stored at home. NOTE: This sheet is a summary. It may not cover all possible information. If you have questions about this medicine, talk to your doctor, pharmacist, or health care [name].  2015, Elsevier/Gold Standard. (2013-11-21 17:43:04)

## 2015-09-03 NOTE — Progress Notes (Signed)
Hematology and Oncology Follow Up Visit  [name] [id number] [birth year] 54 y.o. 09/03/2015   Principle Diagnosis:   Metastatic neuroendocrine carcinoma  Von Hipple-Lindau Syndrome  Current Therapy:  Afinitor 10 mg by mouth daily  Somatuline 120mg  SQ q month     Interim History:  [name] is back for followup.he sees me doing pretty well. He now is on Afinitor. He's been on now for about 3-4 weeks. So far, he is tolerating it pretty well. He has not had any issues with mucositis.  He is not having any issues with his CNS lesions. The radiation oncologist in town do not feel that there is any indication for intervention with stereotactic radiosurgery.  He sent the MRI up to [place] where his other doctors are. They will look at this.  He has had some elevated blood sugars. I'm sure the Afinitor is affecting this.  He has not had any recent temperatures. At the start of the Afinitor, he did have a temperature. He probably had a urinary tract infection. He was put on some Levaquin.  His blood pressures been a little on the low side. He has had issue in the past where he has had some hyper tensive issues.  He's had no bleeding.  He's had no diarrhea.  As always, his wife is doing a great job taking care of him. She is truly dedicated to him and I think his wonderful that she has been able to be such a strength.  Overall, his performance status is ECOG 1.   Medications:  Current outpatient prescriptions:  .  baclofen (LIORESAL) 20 MG tablet, Take 20-40 mg by mouth 5 (five) times daily., Disp: , Rfl:  .  diazepam (VALIUM) 5 MG tablet, TAKE 1 TABLET BY MOUTH 3 TIMES DAILY AS NEEDED FOR MUSCLE SPASMS/ANXIETY, Disp: 40 tablet, Rfl: 1 .  doxazosin (CARDURA) 1 MG tablet, Take 1 tablet (1 mg total) by mouth 2 (two) times daily., Disp: 60 tablet, Rfl: 2 .  dronabinol (MARINOL) 5 MG capsule, TAKE 1 CAPSULE BY MOUTH TWICE DAILY WITH MEALS, Disp: 60 capsule, Rfl: 1 .   everolimus (AFINITOR) 10 MG tablet, Take 1 tablet (10 mg total) by mouth daily., Disp: 30 tablet, Rfl: 2 .  HYDROcodone-acetaminophen (NORCO/VICODIN) 5-325 MG tablet, Take 1 tablet by mouth every 6 (six) hours as needed for moderate pain., Disp: 90 tablet, Rfl: 0 .  HYDROmorphone (DILAUDID) 4 MG tablet, Take 4 mg by mouth every 6 (six) hours as needed for severe pain., Disp: , Rfl:  .  insulin glargine (LANTUS) 100 UNIT/ML injection, Inject 25 Units into the skin daily before breakfast. , Disp: , Rfl:  .  insulin lispro (HUMALOG) 100 UNIT/ML injection, Inject 3-9 Units into the skin 3 (three) times daily before meals. SS, Disp: , Rfl:  .  midodrine (PROAMATINE) 5 MG tablet, TKAE 1/2 TABLET BY MOUTH TWICE A DAY AS NEEDED, Disp: 60 tablet, Rfl: 6 .  Pancrelipase, Lip-Prot-Amyl, 24000 UNITS CPEP, Take 3 capsules by mouth 3 (three) times daily with meals., Disp: , Rfl:  .  polyethylene glycol (MIRALAX / GLYCOLAX) packet, Take 17 g by mouth daily., Disp: 30 each, Rfl: 0 .  promethazine (PHENERGAN) 12.5 MG tablet, Take 12.5 mg by mouth every 6 (six) hours as needed for nausea. , Disp: , Rfl:  .  tiZANidine (ZANAFLEX) 4 MG tablet, TAKE 2 TABLETS BY MOUTH EVERY 6 HOURS AS NEEDED FOR PAIN, Disp: 30 tablet, Rfl: 3 .  traMADol (  ULTRAM) 50 MG tablet, TAKE 1 TABLET BY MOUTH 3 TIMES DAILY, Disp: 90 tablet, Rfl: 2 .  montelukast (SINGULAIR) 10 MG tablet, Take 1 tablet (10 mg total) by mouth at bedtime., Disp: 21 tablet, Rfl: 3  Allergies:  Allergies  Allergen Reactions  . Ciprocin-Fluocin-Procin [Fluocinolone Acetonide] Rash    Pt states this happened with IV Cipro. ABLE TO TAKE PO CIPRO.  . Other Rash    IV-CIPRO    Past Medical History, Surgical history, Social history, and Family History were reviewed and updated.  Review of Systems: As above  Physical Exam:  height is 5\' 9"  (1.753 m) and weight is 164 lb (74.39 kg). His oral temperature is 97.6 F (36.4 C). His blood pressure is 104/64 and his  pulse is 81. His respiration is 16.   Well-developed and well-nourished gentleman. He is paralyzed from the waist down. His head exam shows no ocular or oral lesions. He has no palpable cervical or supraclavicular lymph nodes. His lungs are clear. Cardiac exam regular rate and rhythm with no murmurs, rubs or bruits. Abdomen is soft. Has good bowel sounds. There is no fluid wave. He has a well-healed laparotomy scar is. He has no obvious abdominal mass. There is no obvious liver or spleen tip. Back exam shows a laminectomy scar in the neck and upper thoracic spine. Extremities shows no swelling in his lower legs. He has no strength secondary to the paralysis. Neurological exam is nonfocal. Skin exam shows no rashes.  Lab Results  Component Value Date   WBC 6.9 09/02/2015   HGB 15.0 09/02/2015   HCT 46.4 09/02/2015   MCV 79* 09/02/2015   PLT 263 09/02/2015     Chemistry      Component Value Date/Time   NA 141 09/02/2015 1017   NA 136 07/22/2015 0937   K 4.3 09/02/2015 1017   K 4.2 07/22/2015 0937   CL 101 09/02/2015 1017   CL 96* 07/22/2015 0937   CO2 27 09/02/2015 1017   CO2 28 07/22/2015 0937   BUN 15 09/02/2015 1017   BUN 20 07/22/2015 0937   CREATININE 0.85 09/02/2015 1017   CREATININE 0.9 07/22/2015 0937      Component Value Date/Time   CALCIUM 8.4* 09/02/2015 1017   CALCIUM 9.4 07/22/2015 0937   ALKPHOS 133* 09/02/2015 1017   ALKPHOS 150* 07/22/2015 0937   AST 39* 09/02/2015 1017   AST 45* 07/22/2015 0937   ALT 27 09/02/2015 1017   ALT 27 07/22/2015 0937   BILITOT 0.4 09/02/2015 1017   BILITOT 0.90 07/22/2015 0937         Impression and Plan: [name] is 54 year old gentleman with von Hippel-Lindau syndrome. He has multiple neuroendocrine tumors. 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 He's had multiple spinal surgeries.  He is on Afinitor. He is doing well with the Afinitor. It is still a little too early to know how the Afinitor  is going to work.  As far as the CNS lesions, from my point of view, he seems to be pretty much asymptomatic with these. Hopefully, we can just watch these.  We still will continue him on Somatuline.  I will see him back in one month.  Volanda Napoleon, MD 9/28/20163:26 PM

## 2015-09-05 LAB — COMPREHENSIVE METABOLIC PANEL
ALK PHOS: 133 U/L — AB (ref 40–115)
ALT: 27 U/L (ref 9–46)
AST: 39 U/L — AB (ref 10–35)
Albumin: 3.4 g/dL — ABNORMAL LOW (ref 3.6–5.1)
BILIRUBIN TOTAL: 0.4 mg/dL (ref 0.2–1.2)
BUN: 15 mg/dL (ref 7–25)
CO2: 27 mmol/L (ref 20–31)
CREATININE: 0.85 mg/dL (ref 0.70–1.33)
Calcium: 8.4 mg/dL — ABNORMAL LOW (ref 8.6–10.3)
Chloride: 101 mmol/L (ref 98–110)
GLUCOSE: 62 mg/dL — AB (ref 65–99)
Potassium: 4.3 mmol/L (ref 3.5–5.3)
SODIUM: 141 mmol/L (ref 135–146)
Total Protein: 6.9 g/dL (ref 6.1–8.1)

## 2015-09-05 LAB — LACTATE DEHYDROGENASE: LDH: 244 U/L (ref 94–250)

## 2015-09-05 LAB — CHROMOGRANIN A: CHROMOGRANIN A: 19 ng/mL — AB (ref <=15)

## 2015-09-24 ENCOUNTER — Emergency Department (HOSPITAL_COMMUNITY): Payer: 59

## 2015-09-24 ENCOUNTER — Emergency Department (HOSPITAL_COMMUNITY)
Admission: EM | Admit: 2015-09-24 | Discharge: 2015-09-24 | Disposition: A | Discharge status: Home / Self Care | Payer: 59 | Source: Home / Self Care | Attending: Emergency Medicine | Admitting: Emergency Medicine

## 2015-09-24 ENCOUNTER — Inpatient Hospital Stay (HOSPITAL_COMMUNITY)
Admission: EM | Admit: 2015-09-24 | Discharge: 2015-09-27 | DRG: 872 | Disposition: A | Discharge status: Home / Self Care | Payer: 59 | Attending: Internal Medicine | Admitting: Internal Medicine

## 2015-09-24 ENCOUNTER — Encounter (HOSPITAL_COMMUNITY): Payer: Self-pay | Admitting: Emergency Medicine

## 2015-09-24 ENCOUNTER — Telehealth: Payer: Self-pay | Admitting: *Deleted

## 2015-09-24 ENCOUNTER — Encounter (HOSPITAL_COMMUNITY): Payer: Self-pay

## 2015-09-24 DIAGNOSIS — C7A8 Other malignant neuroendocrine tumors: Secondary | ICD-10-CM | POA: Diagnosis not present

## 2015-09-24 DIAGNOSIS — I959 Hypotension, unspecified: Secondary | ICD-10-CM | POA: Diagnosis not present

## 2015-09-24 DIAGNOSIS — Z8249 Family history of ischemic heart disease and other diseases of the circulatory system: Secondary | ICD-10-CM

## 2015-09-24 DIAGNOSIS — I10 Essential (primary) hypertension: Secondary | ICD-10-CM | POA: Diagnosis present

## 2015-09-24 DIAGNOSIS — Q858 Other phakomatoses, not elsewhere classified: Secondary | ICD-10-CM | POA: Diagnosis not present

## 2015-09-24 DIAGNOSIS — R55 Syncope and collapse: Secondary | ICD-10-CM | POA: Diagnosis not present

## 2015-09-24 DIAGNOSIS — Z90410 Acquired total absence of pancreas: Secondary | ICD-10-CM | POA: Diagnosis not present

## 2015-09-24 DIAGNOSIS — K649 Unspecified hemorrhoids: Secondary | ICD-10-CM | POA: Diagnosis present

## 2015-09-24 DIAGNOSIS — E860 Dehydration: Secondary | ICD-10-CM | POA: Diagnosis present

## 2015-09-24 DIAGNOSIS — E119 Type 2 diabetes mellitus without complications: Secondary | ICD-10-CM | POA: Diagnosis present

## 2015-09-24 DIAGNOSIS — D649 Anemia, unspecified: Secondary | ICD-10-CM | POA: Diagnosis present

## 2015-09-24 DIAGNOSIS — K625 Hemorrhage of anus and rectum: Secondary | ICD-10-CM | POA: Diagnosis not present

## 2015-09-24 DIAGNOSIS — Z87440 Personal history of urinary (tract) infections: Secondary | ICD-10-CM

## 2015-09-24 DIAGNOSIS — Z681 Body mass index (BMI) 19 or less, adult: Secondary | ICD-10-CM | POA: Diagnosis not present

## 2015-09-24 DIAGNOSIS — Z85528 Personal history of other malignant neoplasm of kidney: Secondary | ICD-10-CM | POA: Diagnosis not present

## 2015-09-24 DIAGNOSIS — R509 Fever, unspecified: Secondary | ICD-10-CM | POA: Diagnosis present

## 2015-09-24 DIAGNOSIS — Z87891 Personal history of nicotine dependence: Secondary | ICD-10-CM

## 2015-09-24 DIAGNOSIS — K8689 Other specified diseases of pancreas: Secondary | ICD-10-CM | POA: Diagnosis present

## 2015-09-24 DIAGNOSIS — D1809 Hemangioma of other sites: Secondary | ICD-10-CM | POA: Diagnosis present

## 2015-09-24 DIAGNOSIS — Z993 Dependence on wheelchair: Secondary | ICD-10-CM

## 2015-09-24 DIAGNOSIS — N390 Urinary tract infection, site not specified: Secondary | ICD-10-CM | POA: Diagnosis not present

## 2015-09-24 DIAGNOSIS — Z881 Allergy status to other antibiotic agents status: Secondary | ICD-10-CM | POA: Diagnosis not present

## 2015-09-24 DIAGNOSIS — R197 Diarrhea, unspecified: Secondary | ICD-10-CM | POA: Diagnosis present

## 2015-09-24 DIAGNOSIS — A419 Sepsis, unspecified organism: Secondary | ICD-10-CM | POA: Diagnosis present

## 2015-09-24 DIAGNOSIS — Z79891 Long term (current) use of opiate analgesic: Secondary | ICD-10-CM

## 2015-09-24 DIAGNOSIS — I951 Orthostatic hypotension: Secondary | ICD-10-CM | POA: Diagnosis not present

## 2015-09-24 DIAGNOSIS — Z794 Long term (current) use of insulin: Secondary | ICD-10-CM | POA: Diagnosis not present

## 2015-09-24 DIAGNOSIS — E891 Postprocedural hypoinsulinemia: Secondary | ICD-10-CM | POA: Diagnosis present

## 2015-09-24 DIAGNOSIS — K921 Melena: Secondary | ICD-10-CM | POA: Diagnosis not present

## 2015-09-24 DIAGNOSIS — K648 Other hemorrhoids: Secondary | ICD-10-CM | POA: Diagnosis not present

## 2015-09-24 DIAGNOSIS — Z66 Do not resuscitate: Secondary | ICD-10-CM | POA: Diagnosis present

## 2015-09-24 DIAGNOSIS — Z79899 Other long term (current) drug therapy: Secondary | ICD-10-CM | POA: Diagnosis not present

## 2015-09-24 DIAGNOSIS — E440 Moderate protein-calorie malnutrition: Secondary | ICD-10-CM | POA: Diagnosis not present

## 2015-09-24 DIAGNOSIS — C7B02 Secondary carcinoid tumors of liver: Secondary | ICD-10-CM | POA: Diagnosis present

## 2015-09-24 DIAGNOSIS — D480 Neoplasm of uncertain behavior of bone and articular cartilage: Secondary | ICD-10-CM | POA: Diagnosis not present

## 2015-09-24 DIAGNOSIS — E139 Other specified diabetes mellitus without complications: Secondary | ICD-10-CM

## 2015-09-24 DIAGNOSIS — G8220 Paraplegia, unspecified: Secondary | ICD-10-CM | POA: Diagnosis present

## 2015-09-24 LAB — URINALYSIS, ROUTINE W REFLEX MICROSCOPIC
BILIRUBIN URINE: NEGATIVE
GLUCOSE, UA: NEGATIVE mg/dL
KETONES UR: NEGATIVE mg/dL
NITRITE: POSITIVE — AB
PROTEIN: NEGATIVE mg/dL
Specific Gravity, Urine: 1.004 — ABNORMAL LOW (ref 1.005–1.030)
Urobilinogen, UA: 0.2 mg/dL (ref 0.0–1.0)
pH: 6.5 (ref 5.0–8.0)

## 2015-09-24 LAB — CBC
HEMATOCRIT: 36 % — AB (ref 39.0–52.0)
HEMOGLOBIN: 11.4 g/dL — AB (ref 13.0–17.0)
MCH: 25 pg — AB (ref 26.0–34.0)
MCHC: 31.7 g/dL (ref 30.0–36.0)
MCV: 78.9 fL (ref 78.0–100.0)
Platelets: 115 10*3/uL — ABNORMAL LOW (ref 150–400)
RBC: 4.56 MIL/uL (ref 4.22–5.81)
RDW: 13.1 % (ref 11.5–15.5)
WBC: 4.7 10*3/uL (ref 4.0–10.5)

## 2015-09-24 LAB — COMPREHENSIVE METABOLIC PANEL
ALBUMIN: 2.8 g/dL — AB (ref 3.5–5.0)
ALK PHOS: 112 U/L (ref 38–126)
ALT: 19 U/L (ref 17–63)
ANION GAP: 9 (ref 5–15)
AST: 29 U/L (ref 15–41)
BILIRUBIN TOTAL: 1.2 mg/dL (ref 0.3–1.2)
BUN: 16 mg/dL (ref 6–20)
CALCIUM: 8.2 mg/dL — AB (ref 8.9–10.3)
CO2: 27 mmol/L (ref 22–32)
Chloride: 101 mmol/L (ref 101–111)
Creatinine, Ser: 0.93 mg/dL (ref 0.61–1.24)
GFR calc Af Amer: >60 mL/min (ref >60)
Glucose, Bld: 148 mg/dL — ABNORMAL HIGH (ref 65–99)
POTASSIUM: 3.5 mmol/L (ref 3.5–5.1)
Sodium: 137 mmol/L (ref 135–145)
TOTAL PROTEIN: 6.6 g/dL (ref 6.5–8.1)

## 2015-09-24 LAB — CBC WITH DIFFERENTIAL/PLATELET
BASOS PCT: 0 %
Basophils Absolute: 0 10*3/uL (ref 0.0–0.1)
EOS ABS: 0 10*3/uL (ref 0.0–0.7)
EOS PCT: 0 %
HCT: 39.7 % (ref 39.0–52.0)
HEMOGLOBIN: 12.6 g/dL — AB (ref 13.0–17.0)
LYMPHS ABS: 0.8 10*3/uL (ref 0.7–4.0)
Lymphocytes Relative: 10 %
MCH: 24.8 pg — AB (ref 26.0–34.0)
MCHC: 31.7 g/dL (ref 30.0–36.0)
MCV: 78 fL (ref 78.0–100.0)
MONOS PCT: 8 %
Monocytes Absolute: 0.6 10*3/uL (ref 0.1–1.0)
NEUTROS PCT: 82 %
Neutro Abs: 6.1 10*3/uL (ref 1.7–7.7)
PLATELETS: 129 10*3/uL — AB (ref 150–400)
RBC: 5.09 MIL/uL (ref 4.22–5.81)
RDW: 13.1 % (ref 11.5–15.5)
WBC: 7.5 10*3/uL (ref 4.0–10.5)

## 2015-09-24 LAB — URINE MICROSCOPIC-ADD ON

## 2015-09-24 LAB — PROCALCITONIN: Procalcitonin: 0.35 ng/mL

## 2015-09-24 LAB — TROPONIN I: Troponin I: <0.03 ng/mL (ref <0.031)

## 2015-09-24 LAB — CBG MONITORING, ED
Glucose-Capillary: 141 mg/dL — ABNORMAL HIGH (ref 65–99)
Glucose-Capillary: 141 mg/dL — ABNORMAL HIGH (ref 65–99)

## 2015-09-24 LAB — GLUCOSE, CAPILLARY: GLUCOSE-CAPILLARY: 223 mg/dL — AB (ref 65–99)

## 2015-09-24 LAB — I-STAT CG4 LACTIC ACID, ED
LACTIC ACID, VENOUS: 0.61 mmol/L (ref 0.5–2.0)
Lactic Acid, Venous: 1.13 mmol/L (ref 0.5–2.0)
Lactic Acid, Venous: 1.3 mmol/L (ref 0.5–2.0)

## 2015-09-24 MED ORDER — DEXTROSE 5 % IV SOLN
2.0000 g | INTRAVENOUS | Status: DC
  Administered 2015-09-24 – 2015-09-26 (×3): 2 g via INTRAVENOUS
  Filled 2015-09-24 (×3): qty 2

## 2015-09-24 MED ORDER — DRONABINOL 2.5 MG PO CAPS
5.0000 mg | ORAL_CAPSULE | Freq: Every day | ORAL | Status: DC
  Administered 2015-09-25 – 2015-09-26 (×2): 5 mg via ORAL
  Filled 2015-09-24 (×2): qty 2

## 2015-09-24 MED ORDER — ACETAMINOPHEN 325 MG PO TABS: 650.0000 mg | ORAL_TABLET | Freq: Four times a day (QID) | ORAL | Status: DC | PRN

## 2015-09-24 MED ORDER — INSULIN GLARGINE 100 UNIT/ML ~~LOC~~ SOLN
20.0000 [IU] | Freq: Every day | SUBCUTANEOUS | Status: DC
  Administered 2015-09-25 – 2015-09-27 (×3): 20 [IU] via SUBCUTANEOUS
  Filled 2015-09-24 (×3): qty 0.2

## 2015-09-24 MED ORDER — SODIUM CHLORIDE 0.9 % IJ SOLN
3.0000 mL | Freq: Two times a day (BID) | INTRAMUSCULAR | Status: DC
  Administered 2015-09-24 – 2015-09-26 (×2): 3 mL via INTRAVENOUS

## 2015-09-24 MED ORDER — MONTELUKAST SODIUM 10 MG PO TABS
10.0000 mg | ORAL_TABLET | Freq: Every day | ORAL | Status: DC
  Administered 2015-09-24 – 2015-09-26 (×3): 10 mg via ORAL
  Filled 2015-09-24 (×3): qty 1

## 2015-09-24 MED ORDER — TRAMADOL HCL 50 MG PO TABS
50.0000 mg | ORAL_TABLET | Freq: Three times a day (TID) | ORAL | Status: DC
  Administered 2015-09-24 – 2015-09-27 (×8): 50 mg via ORAL
  Filled 2015-09-24 (×8): qty 1

## 2015-09-24 MED ORDER — LEVOFLOXACIN IN D5W 750 MG/150ML IV SOLN
750.0000 mg | Freq: Once | INTRAVENOUS | Status: AC
  Administered 2015-09-24: 750 mg via INTRAVENOUS
  Filled 2015-09-24: qty 150

## 2015-09-24 MED ORDER — MIDODRINE HCL 2.5 MG PO TABS
2.5000 mg | ORAL_TABLET | Freq: Two times a day (BID) | ORAL | Status: DC | PRN
  Filled 2015-09-24: qty 1

## 2015-09-24 MED ORDER — INSULIN ASPART 100 UNIT/ML ~~LOC~~ SOLN
0.0000 [IU] | SUBCUTANEOUS | Status: DC
  Administered 2015-09-24 – 2015-09-25 (×4): 3 [IU] via SUBCUTANEOUS
  Administered 2015-09-25: 5 [IU] via SUBCUTANEOUS
  Administered 2015-09-25: 2 [IU] via SUBCUTANEOUS
  Administered 2015-09-25: 3 [IU] via SUBCUTANEOUS
  Administered 2015-09-26: 5 [IU] via SUBCUTANEOUS
  Administered 2015-09-26: 1 [IU] via SUBCUTANEOUS
  Administered 2015-09-26: 2 [IU] via SUBCUTANEOUS
  Administered 2015-09-26: 5 [IU] via SUBCUTANEOUS
  Administered 2015-09-27: 1 [IU] via SUBCUTANEOUS
  Administered 2015-09-27: 3 [IU] via SUBCUTANEOUS

## 2015-09-24 MED ORDER — ONDANSETRON HCL 4 MG PO TABS: 4.0000 mg | ORAL_TABLET | Freq: Four times a day (QID) | ORAL | Status: DC | PRN

## 2015-09-24 MED ORDER — LEVOFLOXACIN 750 MG PO TABS: 750.0000 mg | ORAL_TABLET | Freq: Every day | ORAL | Status: DC

## 2015-09-24 MED ORDER — DEXTROSE 5 % IV SOLN: 1.0000 g | INTRAVENOUS | Status: DC

## 2015-09-24 MED ORDER — SODIUM CHLORIDE 0.9 % IV SOLN: Freq: Once | INTRAVENOUS | Status: DC

## 2015-09-24 MED ORDER — SODIUM CHLORIDE 0.9 % IV BOLUS (SEPSIS)
1000.0000 mL | Freq: Once | INTRAVENOUS | Status: AC
  Administered 2015-09-24: 1000 mL via INTRAVENOUS

## 2015-09-24 MED ORDER — BACLOFEN 20 MG PO TABS
20.0000 mg | ORAL_TABLET | Freq: Four times a day (QID) | ORAL | Status: DC
  Administered 2015-09-24 – 2015-09-27 (×9): 20 mg via ORAL
  Filled 2015-09-24 (×12): qty 2

## 2015-09-24 MED ORDER — ACETAMINOPHEN 650 MG RE SUPP: 650.0000 mg | Freq: Four times a day (QID) | RECTAL | Status: DC | PRN

## 2015-09-24 MED ORDER — SODIUM CHLORIDE 0.9 % IV SOLN
INTRAVENOUS | Status: AC
  Administered 2015-09-24: 21:00:00 via INTRAVENOUS

## 2015-09-24 MED ORDER — PANCRELIPASE (LIP-PROT-AMYL) 12000-38000 UNITS PO CPEP
72000.0000 [IU] | ORAL_CAPSULE | Freq: Three times a day (TID) | ORAL | Status: DC
  Administered 2015-09-25 – 2015-09-27 (×6): 72000 [IU] via ORAL
  Filled 2015-09-24 (×9): qty 6

## 2015-09-24 MED ORDER — ONDANSETRON HCL 4 MG/2ML IJ SOLN: 4.0000 mg | Freq: Four times a day (QID) | INTRAMUSCULAR | Status: DC | PRN

## 2015-09-24 MED ORDER — HYDROCODONE-ACETAMINOPHEN 5-325 MG PO TABS
1.0000 | ORAL_TABLET | Freq: Four times a day (QID) | ORAL | Status: DC | PRN
  Administered 2015-09-25: 1 via ORAL
  Filled 2015-09-24: qty 1

## 2015-09-24 NOTE — ED Notes (Signed)
Bed: MZ58 Expected date:  Expected time:  Means of arrival:  Comments: EMS, chemo/fever

## 2015-09-24 NOTE — ED Notes (Signed)
Patient's family member states the patient was discharged with a UTI just minutes ago and took patient to the car. Patient had immediate diaphoresis and states he had a lot of pain due to the foley and felt like the pain caused him to feel like he was going to pass out.

## 2015-09-24 NOTE — ED Notes (Signed)
Per EMS-cancer patient, fever since yesterday-101.0-here for eval

## 2015-09-24 NOTE — ED Notes (Signed)
Wife went to pick up wheelchair and med for travel

## 2015-09-24 NOTE — ED Notes (Signed)
md at bedside

## 2015-09-24 NOTE — ED Notes (Signed)
Pt can go to floor at 19:10

## 2015-09-24 NOTE — H&P (Signed)
PCP: Volanda Napoleon, MD    Referring provider Alfonse Spruce   Chief Complaint:  hypotention  HPI: [name]. is a 54 y.o. 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   Presented with 2 day history of watery diarrhea which has now improved. At his baseline patient is status post pancreatectomy and therefore he has some loose stools but never watery this was a change for him. Patient endorses myalgias.  He reports some chills as well as fever at home up to 103.8. Wife reports that she has to digitally stimulate him in order to produce a bowel movement today she has noticed there was some bleeding with stool she thinks it was at least a quarter cup of red mixed stool. Patient reports occasional sharp rectal pains but no abdominal pain. Wife states because of prior history UTI she fought that's probably what was causing this particular episode and started him on Levaquin. Patient's wife called his primary oncology who recommended given fever to have evaluation in emergency department. He was found to be febrile up to 101 while in ER. UA was worrisome for UTI his lactic acid rhythm remained within normal limits. At first since patient was doing better and diarrhea has resolved so the plan was for him to be discharged home on Levaquin by mouth. Unfortunately when patient attempted to get home he became hypotensive and had to be brought back to emergency department his blood pressure initially was down to 70s over 30. Patient was given normal saline bolus 1 L blood pressure improved and the latest being up to  128/79. At this point decision was made for patient to stay overnight for father rehydration and monitoring.   Patient known history of metastatic neuroendocrine carcinoma of the pancreas  currently on Afinitor. As well as Von Hipple Lindau Syndrome. 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 History of renal cell carcinoma as well as retinal angiomas. Examination has undergone in the past treatment at [facility].  Patient has history of recurrent urinary tract infections in the past treated well with Levaquin. He has had recurrent episodes of hypotension for to be most likely secondary to orthostatic hypotension. Patient has been on Midodrin.  Patient does have history of diabetes for which she is taking Lantus. He reports good control.    Hospitalist was called for admission for Sepsis UTI and right red blood per rectum  Review of Systems:    Pertinent positives include:  Fevers, chills, fatigue, myalgia  Constitutional:  No weight loss, night sweats,weight loss  HEENT:  No headaches, Difficulty swallowing,Tooth/dental problems,Sore throat,  No sneezing, itching, ear ache, nasal congestion, post nasal drip,  Cardio-vascular:  No chest pain, Orthopnea, PND, anasarca, dizziness, palpitations.no Bilateral lower extremity swelling  GI:  No heartburn, indigestion, abdominal pain, nausea, vomiting, diarrhea, change in bowel habits, loss of appetite, melena, blood in stool, hematemesis Resp:  no shortness of breath at rest. No dyspnea on exertion, No excess mucus, no productive cough, No non-productive cough, No coughing up of blood.No change in color of mucus.No wheezing. Skin:  no rash or lesions. No jaundice GU:  no dysuria, change in color of urine, no urgency or frequency. No straining to urinate.  No flank pain.  Musculoskeletal:  No joint pain or no joint swelling. No decreased range of motion. No back pain.  Psych:  No change in mood  or affect. No depression or anxiety. No memory loss.  Neuro: no localizing neurological complaints, no tingling, no weakness, no double vision, no gait abnormality, no slurred speech, no confusion  Otherwise ROS are negative except for above, 10 systems were reviewed  Past Medical History: Past Medical History  Diagnosis Date  . Von Hippel-Lindau syndrome ([place])     genetic disease that causes tumors  . Diabetes mellitus without complication ([place])   . Neuroendocrine carcinoma metastatic to liver ([place])   . 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 Headache(784.0)     neuropathic pain usually behind eyes related to blood pressure  . Transfusion history     '09 .   Marland Kitchen Iron deficiency anemia, unspecified 07/10/2014  . HTN (hypertension) 08/19/2014  . 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 Orthostatic hypotension 08/19/2014  . Anemia 08/19/2014   Past Surgical History  Procedure Laterality Date  . Back surgery      '91- T#-T5 "tumor resection"-benign  . Reconstruction breast w/ tram flap    . Muscle flap myocutaneous / fasciocutaneous of trunk Left     '01  . Craniotomy for tumor      medulla tumor "benign tumor"-salvage own bone for closure  . Peripherally inserted central catheter insertion      past hx.  . Enucleation Left     '05 with prosthesis  . Cervical spine surgery      '07- resection tumor C5-C7-"benign"  . Partial nephrectomy Right     '09- cancer  . Pancreas surgery      '09- with partial liver resection/ pancreas "[facility]"  . 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 Insertion of suprapubic catheter N/A 02/04/2014    Procedure: INSERTION OF SUPRAPUBIC CATHETER;  Surgeon: Franchot Gallo, MD;  Location: [facility]S;  Service: Urology;  Laterality: N/A;  . Cystoscopy with litholapaxy N/A 02/04/2014    Procedure: CYSTOSCOPY WITH LITHOLAPAXY;  Surgeon: Franchot Gallo, MD;  Location: [facility];  Service: Urology;   Laterality: N/A;  . Colonoscopy with propofol N/A 08/20/2014    Procedure: COLONOSCOPY WITH PROPOFOL;  Surgeon: Cleotis Nipper, MD;  Location: [facility] END[facility]COPY;  Service: Endoscopy;  Laterality: N/A;     Medications: Prior to Admission medications   Medication Sig Start Date End Date Taking? Authorizing Provider  baclofen (LIORESAL) 20 MG tablet Take 20-40 mg by mouth 4 (four) times daily.     Historical Provider, MD  diazepam (VALIUM) 5 MG tablet TAKE 1 TABLET BY MOUTH 3 TIMES DAILY AS NEEDED FOR MUSCLE SPASMS/ANXIETY 08/18/15   Volanda Napoleon, MD  doxazosin (CARDURA) 1 MG tablet Take 1 tablet (1 mg total) by mouth 2 (two) times daily. Patient taking differently: Take 1 mg by mouth 2 (two) times daily as needed (blood pressure spikes.).  09/03/14   Volanda Napoleon, MD  dronabinol (MARINOL) 5 MG capsule TAKE 1 CAPSULE BY MOUTH TWICE DAILY WITH MEALS 08/13/15   Volanda Napoleon, MD  everolimus (AFINITOR) 10 MG tablet Take 1 tablet (10 mg total) by mouth daily. 07/22/15   Eliezer Bottom, NP  HYDROcodone-acetaminophen (NORCO/VICODIN) 5-325 MG tablet Take 1 tablet by mouth every 6 (six) hours as needed for moderate pain. 09/02/15   Volanda Napoleon,  MD  HYDROmorphone (DILAUDID) 4 MG tablet Take 4 mg by mouth every 6 (six) hours as needed for severe pain.    Historical Provider, MD  insulin glargine (LANTUS) 100 UNIT/ML injection Inject 31 Units into the skin daily before breakfast.     Historical Provider, MD  insulin lispro (HUMALOG) 100 UNIT/ML injection Inject 3-9 Units into the skin 3 (three) times daily before meals. SS    Historical Provider, MD  levofloxacin (LEVAQUIN) 500 MG tablet Take 1 tablet by mouth daily as needed. Fever, uti 09/04/15   Historical Provider, MD  levofloxacin (LEVAQUIN) 750 MG tablet Take 1 tablet (750 mg total) by mouth daily. X 7 days 09/24/15   Deno Etienne, DO  midodrine (PROAMATINE) 5 MG tablet TKAE 1/2 TABLET BY MOUTH TWICE A DAY AS NEEDED Patient taking differently: TKAE  1/2 TABLET BY MOUTH TWICE A DAY AS NEEDED BLOOD PRESSURE. 08/18/15   Volanda Napoleon, MD  montelukast (SINGULAIR) 10 MG tablet Take 1 tablet (10 mg total) by mouth at bedtime. 09/02/15   Volanda Napoleon, MD  Pancrelipase, Lip-Prot-Amyl, 24000 UNITS CPEP Take 3 capsules by mouth 3 (three) times daily with meals.    Historical Provider, MD  polyethylene glycol (MIRALAX / GLYCOLAX) packet Take 17 g by mouth daily. Patient not taking: Reported on 09/24/2015 08/23/14   Verlee Monte, MD  promethazine (PHENERGAN) 12.5 MG tablet Take 12.5 mg by mouth every 6 (six) hours as needed for nausea.     Historical Provider, MD  tiZANidine (ZANAFLEX) 4 MG tablet TAKE 2 TABLETS BY MOUTH EVERY 6 HOURS AS NEEDED FOR PAIN 12/29/14   Volanda Napoleon, MD  traMADol (ULTRAM) 50 MG tablet TAKE 1 TABLET BY MOUTH 3 TIMES DAILY 06/18/15   Volanda Napoleon, MD    Allergies:   Allergies  Allergen Reactions  . Ciprocin-Fluocin-Procin [Fluocinolone Acetonide] Rash    Pt states this happened with IV Cipro. ABLE TO TAKE PO CIPRO.  . Other Rash    IV-CIPRO    Social History:  Ambulatory wheelchair bound Lives at home   With family     reports that he quit smoking about 7 years ago. His smoking use included Cigarettes. He started smoking about 16 years ago. He has a 3 pack-year smoking history. He has never used smokeless tobacco. He reports that he drinks alcohol. He reports that he does not use illicit drugs.    Family History: family history includes Heart failure in his father.    Physical Exam: Patient Vitals for the past 24 hrs:  BP Temp Temp src Pulse Resp SpO2  09/24/15 1800 128/79 mmHg - - - 16 -  09/24/15 1745 133/78 mmHg - - - 13 -  09/24/15 1730 117/77 mmHg - - - 11 -  09/24/15 1715 100/66 mmHg - - - 11 -  09/24/15 1700 (!) 80/54 mmHg - - - (!) 9 -  09/24/15 1645 (!) 89/61 mmHg - - 82 15 -  09/24/15 1643 - 97.7 F (36.5 C) Oral 79 11 97 %  09/24/15 1627 (!) 78/38 mmHg - - - - -  09/24/15 1623 (!) 80/50  mmHg - - - - -  09/24/15 1622 (!) 84/53 mmHg - - - - -  09/24/15 1615 110/98 mmHg 97.7 F (36.5 C) Oral 88 22 96 %    1. General:  in No Acute distress 2. Psychological: Alert and  Oriented 3. Head/ENT:    Dry Mucous Membranes pale  Head Non traumatic, neck supple                          Normal  Dentition 4. SKIN:  decreased Skin turgor,  Skin clean Dry and intact no rash 5. Heart: Regular rate and rhythm no Murmur, Rub or gallop 6. Lungs: Clear to auscultation bilaterally, no wheezes or crackles   7. Abdomen: Soft, non-tender, Non distended, suprapubic catheter in place 8. Lower extremities: no clubbing, cyanosis, or edema 9. Neurologically: Paralyzed down from T4 10. MSK: Normal range of motion  body mass index is unknown because there is no weight on file.   Labs on Admission:   Results for orders placed or performed during the hospital encounter of 09/24/15 (from the past 24 hour(s))  CBG monitoring, ED     Status: Abnormal   Collection Time: 09/24/15  4:13 PM  Result Value Ref Range   Glucose-Capillary 141 (H) 65 - 99 mg/dL  CBG monitoring, ED     Status: Abnormal   Collection Time: 09/24/15  4:27 PM  Result Value Ref Range   Glucose-Capillary 141 (H) 65 - 99 mg/dL  I-Stat CG4 Lactic Acid, ED     Status: None   Collection Time: 09/24/15  5:23 PM  Result Value Ref Range   Lactic Acid, Venous 1.30 0.5 - 2.0 mmol/L    UA evidence of UTI level cell count 21-50 positive nitrates many bacteria  Lab Results  Component Value Date   HGBA1C 7.8* 08/19/2014    CrCl cannot be calculated (Unknown ideal weight.).  BNP (last 3 results) No results for input(s): PROBNP in the last 8760 hours.  Other results:  I have pearsonaly reviewed this: ECG REPORT Not obtained  There were no vitals filed for this visit.   Cultures:    Component Value Date/Time   SDES URINE, SUPRAPUBIC 08/19/2014 1812   SPECREQUEST NONE 08/19/2014 1812   CULT  YEAST Performed at [facility] 08/19/2014 1812   REPTSTATUS 08/21/2014 FINAL 08/19/2014 1812     Radiological Exams on Admission: Dg Chest 2 View  09/24/2015  CLINICAL DATA:  Fever starting yesterday, metastatic carcinoma EXAM: CHEST  2 VIEW COMPARISON:  5/17/ 16 FINDINGS: Cardiomediastinal silhouette is stable. No acute infiltrate or pulmonary edema. There is dextroscoliosis of thoracic spine. IMPRESSION: No active cardiopulmonary disease.  Dextroscoliosis thoracic spine. Electronically Signed   By: Lahoma Crocker M.D.   On: 09/24/2015 13:46    Chart has been reviewed  Family at  Bedside  plan of care was discussed with Wife wendy [phone],    Assessment/Plan 54 year-old gentleman history of metastatic neuroendocrine  Tumor and Von Hipple Lindau Syndrome.   here with sepsis likely secondary to UTI and BRBPR Present on Admission:  . Hypotension examination have had a history of hypertension past this has seemed to be improved with IV fluids. We'll continue fluid resuscitation  . UTI (lower urinary tract infection) obtain urine culture he was already started on Levaquin for 1 day but continued to be febrile for now change to Rocephin  and await results of urine cultures    . Dehydration many started fluids check orthostatics in the morning  . Neuroendocrine carcinoma [facility]) oncology aware of the patient being hospitalizedwill see him tomorrow for now will hold by mouth chemotherapy . Sepsis [facility]) admitted for sepsis protocol administrator IV antibiotics, obtain blood cultures and urine culture follow lactic acid, stable most likely source being urine Blood in stool -  will discuss with GI. Follow CBCs. It's possible the patient may have a fissure. Abdomen is benign at this point patient is no longer febrile. Normal WBC, will hold off on CT for now  Prophylaxis:  scd  CODE STATUS:   DNR/DNI as per patient   Disposition:  To home once workup is complete and patient is  stable  Other plan as per orders.  I have spent a total of 69min on this admission Larose Hires taken to discuss case with oncology  [place] 09/24/2015, 6:49 PM  [facility]  Pager [phone]   after 2 AM please page floor coverage PA If 7AM-7PM, please contact the day team taking care of the patient  [email or web address]  Password [email or web address]

## 2015-09-24 NOTE — ED Provider Notes (Signed)
CSN: [id number]     Arrival date & time 09/24/15  1243 History   First MD Initiated Contact with Patient 09/24/15 1430     Chief Complaint  Patient presents with  . Ca pt, Fever      (Consider location/radiation/quality/duration/timing/severity/associated sxs/prior Treatment) Patient is a 54 y.o. male presenting with general illness. The history is provided by the patient and the spouse.  Illness Severity:  Moderate Onset quality:  Sudden Duration:  2 days Timing:  Constant Progression:  Worsening Chronicity:  New Associated symptoms: abdominal pain, congestion, cough, diarrhea, fever and nausea   Associated symptoms: no chest pain, no headaches, no myalgias, no rash, no shortness of breath and no vomiting     54 yo M with a chief complaint of a fever. This started a couple days ago and was low-grade yesterday had a temp of 103.8. Patient is on oral chemotherapy for his neuroendocrine tumors. Having some nausea and loose stools. Patient has loose stools chronically with Foley care little more watery. Patient also had a little bit of bright red blood in them this afternoon. Had only about one bowel movement was created to his bowel regimen. Took some ibuprofen at home with relief of the fever and the aches.  Past Medical History  Diagnosis Date  . 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 Diabetes mellitus without complication ([place])   . 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 Headache(784.0)     neuropathic pain usually behind eyes related to blood pressure  . Transfusion history     '09 .   Marland Kitchen Iron deficiency anemia, unspecified 07/10/2014  . HTN (hypertension) 08/19/2014  . 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 Orthostatic hypotension 08/19/2014  . Anemia 08/19/2014   Past Surgical History  Procedure Laterality Date  . Back surgery      '91- T#-T5 "tumor  resection"-benign  . Reconstruction breast w/ tram flap    . Muscle flap myocutaneous / fasciocutaneous of trunk Left     '01  . Craniotomy for tumor      medulla tumor "benign tumor"-salvage own bone for closure  . Peripherally inserted central catheter insertion      past hx.  . Enucleation Left     '05 with prosthesis  . Cervical spine surgery      '07- resection tumor C5-C7-"benign"  . Partial nephrectomy Right     '09- cancer  . Pancreas surgery      '09- with partial liver resection/ pancreas "Whipple"  . 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 Insertion of suprapubic catheter N/A 02/04/2014    Procedure: INSERTION OF SUPRAPUBIC CATHETER;  Surgeon: Franchot Gallo, MD;  Location: [facility];  Service: Urology;  Laterality: N/A;  . Cystoscopy with litholapaxy N/A 02/04/2014    Procedure: CYSTOSCOPY WITH LITHOLAPAXY;  Surgeon: Franchot Gallo, MD;  Location: [facility];  Service: Urology;  Laterality: N/A;  . Colonoscopy with propofol N/A 08/20/2014    Procedure: COLONOSCOPY WITH PROPOFOL;  Surgeon: Cleotis Nipper, MD;  Location: [facility];  Service: Endoscopy;  Laterality: N/A;   No family history on file. Social History  Substance Use Topics  . Smoking status: Former Smoker -- 0.50 packs/day for 6 years    Types: Cigarettes    Start date: 01/15/1999    Quit date:  09/23/2008  . Smokeless tobacco: Never Used     Comment: quit 6 years ago  . Alcohol Use: 0.0 oz/week    0 Standard drinks or equivalent per week     Comment: only rare occasions    Review of Systems  Constitutional: Positive for fever. Negative for chills.  HENT: Positive for congestion. Negative for facial swelling.   Eyes: Negative for discharge and visual disturbance.  Respiratory: Positive for cough. Negative for shortness of breath.   Cardiovascular: Negative for chest pain and palpitations.  Gastrointestinal: Positive for nausea, abdominal pain and diarrhea. Negative for  vomiting.  Musculoskeletal: Negative for myalgias and arthralgias.  Skin: Negative for color change and rash.  Neurological: Negative for tremors, syncope and headaches.  Psychiatric/Behavioral: Negative for confusion and dysphoric mood.      Allergies  Ciprocin-fluocin-procin and Other  Home Medications   Prior to Admission medications   Medication Sig Start Date End Date Taking? Authorizing Provider  baclofen (LIORESAL) 20 MG tablet Take 20-40 mg by mouth 4 (four) times daily.    Yes Historical Provider, MD  diazepam (VALIUM) 5 MG tablet TAKE 1 TABLET BY MOUTH 3 TIMES DAILY AS NEEDED FOR MUSCLE SPASMS/ANXIETY 08/18/15  Yes Volanda Napoleon, MD  doxazosin (CARDURA) 1 MG tablet Take 1 tablet (1 mg total) by mouth 2 (two) times daily. Patient taking differently: Take 1 mg by mouth 2 (two) times daily as needed (blood pressure spikes.).  09/03/14  Yes Volanda Napoleon, MD  dronabinol (MARINOL) 5 MG capsule TAKE 1 CAPSULE BY MOUTH TWICE DAILY WITH MEALS 08/13/15  Yes Volanda Napoleon, MD  everolimus (AFINITOR) 10 MG tablet Take 1 tablet (10 mg total) by mouth daily. 07/22/15  Yes Eliezer Bottom, NP  HYDROcodone-acetaminophen (NORCO/VICODIN) 5-325 MG tablet Take 1 tablet by mouth every 6 (six) hours as needed for moderate pain. 09/02/15  Yes Volanda Napoleon, MD  HYDROmorphone (DILAUDID) 4 MG tablet Take 4 mg by mouth every 6 (six) hours as needed for severe pain.   Yes Historical Provider, MD  insulin glargine (LANTUS) 100 UNIT/ML injection Inject 31 Units into the skin daily before breakfast.    Yes Historical Provider, MD  insulin lispro (HUMALOG) 100 UNIT/ML injection Inject 3-9 Units into the skin 3 (three) times daily before meals. SS   Yes Historical Provider, MD  levofloxacin (LEVAQUIN) 500 MG tablet Take 1 tablet by mouth daily as needed. Fever, uti 09/04/15  Yes Historical Provider, MD  midodrine (PROAMATINE) 5 MG tablet TKAE 1/2 TABLET BY MOUTH TWICE A DAY AS NEEDED Patient taking  differently: TKAE 1/2 TABLET BY MOUTH TWICE A DAY AS NEEDED BLOOD PRESSURE. 08/18/15  Yes Volanda Napoleon, MD  montelukast (SINGULAIR) 10 MG tablet Take 1 tablet (10 mg total) by mouth at bedtime. 09/02/15  Yes Volanda Napoleon, MD  Pancrelipase, Lip-Prot-Amyl, 24000 UNITS CPEP Take 3 capsules by mouth 3 (three) times daily with meals.   Yes Historical Provider, MD  promethazine (PHENERGAN) 12.5 MG tablet Take 12.5 mg by mouth every 6 (six) hours as needed for nausea.    Yes Historical Provider, MD  tiZANidine (ZANAFLEX) 4 MG tablet TAKE 2 TABLETS BY MOUTH EVERY 6 HOURS AS NEEDED FOR PAIN 12/29/14  Yes Volanda Napoleon, MD  traMADol (ULTRAM) 50 MG tablet TAKE 1 TABLET BY MOUTH 3 TIMES DAILY 06/18/15  Yes Volanda Napoleon, MD  levofloxacin (LEVAQUIN) 750 MG tablet Take 1 tablet (750 mg total) by mouth daily. X 7 days 09/24/15  Deno Etienne, DO  polyethylene glycol (MIRALAX / GLYCOLAX) packet Take 17 g by mouth daily. Patient not taking: Reported on 09/24/2015 08/23/14   Verlee Monte, MD   BP 160/91 mmHg  Pulse 73  Temp(Src) 99.1 F (37.3 C) (Oral)  Resp 20  SpO2 100% Physical Exam  Constitutional: He is oriented to person, place, and time. He appears well-developed and well-nourished.  HENT:  Head: Normocephalic and atraumatic.  Eyes: EOM are normal. Pupils are equal, round, and reactive to light.  Neck: Normal range of motion. Neck supple. No JVD present.  Cardiovascular: Normal rate and regular rhythm.  Exam reveals no gallop and no friction rub.   No murmur heard. Pulmonary/Chest: No respiratory distress. He has no wheezes.  Abdominal: He exhibits no distension. There is no tenderness. There is no rebound and no guarding.  Musculoskeletal: Normal range of motion. He exhibits no tenderness. Edema: 1 chronic.  Neurological: He is alert and oriented to person, place, and time.  Skin: No rash noted. No pallor.  Psychiatric: He has a normal mood and affect. His behavior is normal.  Nursing note and  vitals reviewed.   ED Course  Procedures (including critical care time) Labs Review Labs Reviewed  COMPREHENSIVE METABOLIC PANEL - Abnormal; Notable for the following:    Glucose, Bld 148 (*)    Calcium 8.2 (*)    Albumin 2.8 (*)    All other components within normal limits  URINALYSIS, ROUTINE W REFLEX MICROSCOPIC (NOT AT [facility]) - Abnormal; Notable for the following:    APPearance CLOUDY (*)    Specific Gravity, Urine 1.004 (*)    Hgb urine dipstick SMALL (*)    Nitrite POSITIVE (*)    Leukocytes, UA LARGE (*)    All other components within normal limits  CBC WITH DIFFERENTIAL/PLATELET - Abnormal; Notable for the following:    Hemoglobin 12.6 (*)    MCH 24.8 (*)    Platelets 129 (*)    All other components within normal limits  URINE MICROSCOPIC-ADD ON - Abnormal; Notable for the following:    Bacteria, UA MANY (*)    All other components within normal limits  CULTURE, BLOOD (ROUTINE X 2)  CULTURE, BLOOD (ROUTINE X 2)  URINE CULTURE  STOOL CULTURE  I-STAT CG4 LACTIC ACID, ED    Imaging Review Dg Chest 2 View  09/24/2015  CLINICAL DATA:  Fever starting yesterday, metastatic carcinoma EXAM: CHEST  2 VIEW COMPARISON:  5/17/ 16 FINDINGS: Cardiomediastinal silhouette is stable. No acute infiltrate or pulmonary edema. There is dextroscoliosis of thoracic spine. IMPRESSION: No active cardiopulmonary disease.  Dextroscoliosis thoracic spine. Electronically Signed   By: Lahoma Crocker M.D.   On: 09/24/2015 13:46   I have personally reviewed and evaluated these images and lab results as part of my medical decision-making.   EKG Interpretation None      MDM   Final diagnoses:  UTI (lower urinary tract infection)    54  Yo M with a chief complaint of fever. Laboratory evaluation here with mild anemia, UA concerning for UTI. CMP unremarkable lactate normal. Discussed findings with family. Discussed possible utility to CT scanning to evaluate for diverticulitis with bright red blood  per rectum. Family currently deciding to trial antibiotics since the bleeding has not continued and will follow-up with their family doctor.  3:06 PM:  I have discussed the diagnosis/risks/treatment options with the patient and family and believe the pt to be eligible for discharge home to follow-up with PCP. We also  discussed returning to the ED immediately if new or worsening sx occur. We discussed the sx which are most concerning (e.g., sudden worsening pain, fever, inability to tolerate by mouth, near syncope, worsening rectal bleeding) that necessitate immediate return. Medications administered to the patient during their visit and any new prescriptions provided to the patient are listed below.  Medications given during this visit Medications - No data to display  New Prescriptions   LEVOFLOXACIN (LEVAQUIN) 750 MG TABLET    Take 1 tablet (750 mg total) by mouth daily. X 7 days    The patient appears reasonably screen and/or stabilized for discharge and I doubt any other medical condition or other [facility] requiring further screening, evaluation, or treatment in the ED at this time prior to discharge.     Deno Etienne, DO 09/24/15 1506

## 2015-09-24 NOTE — ED Provider Notes (Signed)
CSN: [id number]     Arrival date & time 09/24/15  1603 History   First MD Initiated Contact with Patient 09/24/15 1626     Chief Complaint  Patient presents with  . Hypotension     (Consider location/radiation/quality/duration/timing/severity/associated sxs/prior Treatment) HPI Comments: 54 y.o. Male with complicated past medical history including Von Hippel-Lindau syndrome, Neuroendocrine CA with metastases, DM presents for diaphoresis and hypotension.  The patient had been brought to the ER for concern for fever and was found to have a UTI.  His other laboratory studies were unremarkable and patient had stable vitals and so decision was made for discharge with plan for oral antibiotics.  Wife was taking patient out to the car when he suddenly became very diaphoretic, lost color in his face, and appeared very ill.  His blood pressure was taken and found to be 70s/50s even despite maneuvers that help to elevate it and so he was brought back into the ER.  Patient said he felt extremely ill and could feel himself draining away during the episode.   Past Medical History  Diagnosis Date  . 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 Diabetes mellitus without complication ([place])   . 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 Headache(784.0)     neuropathic pain usually behind eyes related to blood pressure  . Transfusion history     '09 .   Marland Kitchen Iron deficiency anemia, unspecified 07/10/2014  . HTN (hypertension) 08/19/2014  . 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 Orthostatic hypotension 08/19/2014  . Anemia 08/19/2014   Past Surgical History  Procedure Laterality Date  . Back surgery      '91- T#-T5 "tumor resection"-benign  . Reconstruction breast w/ tram flap    . Muscle flap myocutaneous / fasciocutaneous of trunk Left     '01  . Craniotomy for tumor      medulla tumor  "benign tumor"-salvage own bone for closure  . Peripherally inserted central catheter insertion      past hx.  . Enucleation Left     '05 with prosthesis  . Cervical spine surgery      '07- resection tumor C5-C7-"benign"  . Partial nephrectomy Right     '09- cancer  . Pancreas surgery      '09- with partial liver resection/ pancreas "Whipple"  . 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 Insertion of suprapubic catheter N/A 02/04/2014    Procedure: INSERTION OF SUPRAPUBIC CATHETER;  Surgeon: Franchot Gallo, MD;  Location: [facility];  Service: Urology;  Laterality: N/A;  . Cystoscopy with litholapaxy N/A 02/04/2014    Procedure: CYSTOSCOPY WITH LITHOLAPAXY;  Surgeon: Franchot Gallo, MD;  Location: [facility];  Service: Urology;  Laterality: N/A;  . Colonoscopy with propofol N/A 08/20/2014    Procedure: COLONOSCOPY WITH PROPOFOL;  Surgeon: Cleotis Nipper, MD;  Location: [facility];  Service: Endoscopy;  Laterality: N/A;   Family History  Problem Relation Age of Onset  . Heart failure Father    Social History  Substance Use Topics  . Smoking status: Former Smoker -- 0.50 packs/day for 6 years    Types: Cigarettes    Start date: 01/15/1999    Quit date: 09/23/2008  . Smokeless tobacco: Never Used     Comment: quit 6 years ago  . Alcohol Use: 0.0  oz/week    0 Standard drinks or equivalent per week     Comment: only rare occasions    Review of Systems  Constitutional: Positive for fever, diaphoresis and fatigue. Negative for chills.  HENT: Negative for congestion, ear discharge and postnasal drip.   Respiratory: Negative for cough, chest tightness and shortness of breath.   Gastrointestinal: Negative for nausea, vomiting and abdominal pain.  Genitourinary: Negative for dysuria, urgency and hematuria.  Musculoskeletal: Negative for myalgias and back pain.  Skin: Negative for rash.  Neurological: Positive for light-headedness. Negative for dizziness,  syncope and headaches.  Hematological: Does not bruise/bleed easily.      Allergies  Ciprocin-fluocin-procin and Other  Home Medications   Prior to Admission medications   Medication Sig Start Date End Date Taking? Authorizing Provider  baclofen (LIORESAL) 20 MG tablet Take 20-40 mg by mouth 4 (four) times daily.     Historical Provider, MD  diazepam (VALIUM) 5 MG tablet TAKE 1 TABLET BY MOUTH 3 TIMES DAILY AS NEEDED FOR MUSCLE SPASMS/ANXIETY 08/18/15   Volanda Napoleon, MD  doxazosin (CARDURA) 1 MG tablet Take 1 tablet (1 mg total) by mouth 2 (two) times daily. Patient taking differently: Take 1 mg by mouth 2 (two) times daily as needed (blood pressure spikes.).  09/03/14   Volanda Napoleon, MD  dronabinol (MARINOL) 5 MG capsule TAKE 1 CAPSULE BY MOUTH TWICE DAILY WITH MEALS 08/13/15   Volanda Napoleon, MD  everolimus (AFINITOR) 10 MG tablet Take 1 tablet (10 mg total) by mouth daily. 07/22/15   Eliezer Bottom, NP  HYDROcodone-acetaminophen (NORCO/VICODIN) 5-325 MG tablet Take 1 tablet by mouth every 6 (six) hours as needed for moderate pain. 09/02/15   Volanda Napoleon, MD  HYDROmorphone (DILAUDID) 4 MG tablet Take 4 mg by mouth every 6 (six) hours as needed for severe pain.    Historical Provider, MD  insulin glargine (LANTUS) 100 UNIT/ML injection Inject 31 Units into the skin daily before breakfast.     Historical Provider, MD  insulin lispro (HUMALOG) 100 UNIT/ML injection Inject 3-9 Units into the skin 3 (three) times daily before meals. SS    Historical Provider, MD  levofloxacin (LEVAQUIN) 500 MG tablet Take 1 tablet by mouth daily as needed. Fever, uti 09/04/15   Historical Provider, MD  levofloxacin (LEVAQUIN) 750 MG tablet Take 1 tablet (750 mg total) by mouth daily. X 7 days 09/24/15   Deno Etienne, DO  midodrine (PROAMATINE) 5 MG tablet TKAE 1/2 TABLET BY MOUTH TWICE A DAY AS NEEDED Patient taking differently: TKAE 1/2 TABLET BY MOUTH TWICE A DAY AS NEEDED BLOOD PRESSURE. 08/18/15    Volanda Napoleon, MD  montelukast (SINGULAIR) 10 MG tablet Take 1 tablet (10 mg total) by mouth at bedtime. 09/02/15   Volanda Napoleon, MD  Pancrelipase, Lip-Prot-Amyl, 24000 UNITS CPEP Take 3 capsules by mouth 3 (three) times daily with meals.    Historical Provider, MD  promethazine (PHENERGAN) 12.5 MG tablet Take 12.5 mg by mouth every 6 (six) hours as needed for nausea.     Historical Provider, MD  tiZANidine (ZANAFLEX) 4 MG tablet TAKE 2 TABLETS BY MOUTH EVERY 6 HOURS AS NEEDED FOR PAIN 12/29/14   Volanda Napoleon, MD  traMADol (ULTRAM) 50 MG tablet TAKE 1 TABLET BY MOUTH 3 TIMES DAILY 06/18/15   Volanda Napoleon, MD   BP 128/79 mmHg  Pulse 82  Temp(Src) 97.7 F (36.5 C) (Oral)  Resp 16  SpO2 97% Physical Exam  Constitutional: He is oriented to person, place, and time. No distress.  HENT:  Head: Normocephalic and atraumatic.  Eyes: EOM are normal. Pupils are equal, round, and reactive to light.  Neck: Normal range of motion. Neck supple.  Cardiovascular: Normal rate, regular rhythm and intact distal pulses.   Pulmonary/Chest: Effort normal. No respiratory distress. He has no wheezes.  Abdominal: Soft. He exhibits no distension. There is no tenderness.  Musculoskeletal:  Patient wheel chair dependent and with muscular atrophy generally  Neurological: He is alert and oriented to person, place, and time.  Skin: Skin is warm and dry. He is not diaphoretic.  Vitals reviewed.   ED Course  Procedures (including critical care time) Labs Review Labs Reviewed  CBG MONITORING, ED - Abnormal; Notable for the following:    Glucose-Capillary 141 (*)    All other components within normal limits  CBG MONITORING, ED - Abnormal; Notable for the following:    Glucose-Capillary 141 (*)    All other components within normal limits  I-STAT CG4 LACTIC ACID, ED  CBG MONITORING, ED  I-STAT CG4 LACTIC ACID, ED    Imaging Review Dg Chest 2 View  09/24/2015  CLINICAL DATA:  Fever starting  yesterday, metastatic carcinoma EXAM: CHEST  2 VIEW COMPARISON:  5/17/ 16 FINDINGS: Cardiomediastinal silhouette is stable. No acute infiltrate or pulmonary edema. There is dextroscoliosis of thoracic spine. IMPRESSION: No active cardiopulmonary disease.  Dextroscoliosis thoracic spine. Electronically Signed   By: Lahoma Crocker M.D.   On: 09/24/2015 13:46   I have personally reviewed and evaluated these images and lab results as part of my medical decision-making.   EKG Interpretation None      MDM  Patient was seen and evaluated in the resuscitation bay.  Was still hypotensive but did not appear in distress.  IV fluids were initiated.  Results from recent ER discharge reviewed.  IV levaquin rather than oral ordered based upon patient's prior treatments for UTI.  Blood pressure improved with IV fluids.  Discussed with oncologist on call for Dr. Marin Olp who agreed with treatment at this time and did recommend overnight hydration in light of hypotensive episode.  Case was discussed with Dr. Roel Cluck who agreed with admission and patient was admitted under her care for continued hydration, treatment, and monitoring. Final diagnoses:  UTI (lower urinary tract infection)  Hypotension, unspecified hypotension type    1. UTI  2. Hypotension    Harvel Quale, MD 09/25/15 1149

## 2015-09-24 NOTE — ED Notes (Signed)
Pt hypotensive, md made aware

## 2015-09-24 NOTE — Discharge Instructions (Signed)
Follow-up with your oncologist. Return for continued or worsening rectal bleeding, abdominal pain or diarrhea. Urinary Tract Infection Urinary tract infections (UTIs) can develop anywhere along your urinary tract. Your urinary tract is your body's drainage system for removing wastes and extra water. Your urinary tract includes two kidneys, two ureters, a bladder, and a urethra. Your kidneys are a pair of bean-shaped organs. Each kidney is about the size of your fist. They are located below your ribs, one on each side of your spine. CAUSES Infections are caused by microbes, which are microscopic organisms, including fungi, viruses, and bacteria. These organisms are so small that they can only be seen through a microscope. Bacteria are the microbes that most commonly cause UTIs. SYMPTOMS  Symptoms of UTIs may vary by age and gender of the patient and by the location of the infection. Symptoms in young women typically include a frequent and intense urge to urinate and a painful, burning feeling in the bladder or urethra during urination. Older women and men are more likely to be tired, shaky, and weak and have muscle aches and abdominal pain. A fever may mean the infection is in your kidneys. Other symptoms of a kidney infection include pain in your back or sides below the ribs, nausea, and vomiting. DIAGNOSIS To diagnose a UTI, your caregiver will ask you about your symptoms. Your caregiver will also ask you to provide a urine sample. The urine sample will be tested for bacteria and white blood cells. White blood cells are made by your body to help fight infection. TREATMENT  Typically, UTIs can be treated with medication. Because most UTIs are caused by a bacterial infection, they usually can be treated with the use of antibiotics. The choice of antibiotic and length of treatment depend on your symptoms and the type of bacteria causing your infection. HOME CARE INSTRUCTIONS  If you were prescribed  antibiotics, take them exactly as your caregiver instructs you. Finish the medication even if you feel better after you have only taken some of the medication.  Drink enough water and fluids to keep your urine clear or pale yellow.  Avoid caffeine, tea, and carbonated beverages. They tend to irritate your bladder.  Empty your bladder often. Avoid holding urine for long periods of time.  Empty your bladder before and after sexual intercourse.  After a bowel movement, women should cleanse from front to back. Use each tissue only once. SEEK MEDICAL CARE IF:   You have back pain.  You develop a fever.  Your symptoms do not begin to resolve within 3 days. SEEK IMMEDIATE MEDICAL CARE IF:   You have severe back pain or lower abdominal pain.  You develop chills.  You have nausea or vomiting.  You have continued burning or discomfort with urination. MAKE SURE YOU:   Understand these instructions.  Will watch your condition.  Will get help right away if you are not doing well or get worse.   This information is not intended to replace advice given to you by your health care provider. Make sure you discuss any questions you have with your health care provider.   Document Released: 09/01/2005 Document Revised: 08/13/2015 Document Reviewed: 12/31/2011 Elsevier Interactive Patient Education [employer].

## 2015-09-24 NOTE — Telephone Encounter (Signed)
Pateitn's wife calling office to inform us of patient condition. He started having diarrhea on Sunday. This was pretty significant, but has decreased and today his bowels are almost back to normal. He feels a general malaise with aching all over. He is experiencing shaking chills with a T-max of 103.8. Wife states that his urine looks okay and his blood sugars are on the low side of normal. She also mentioned that during his bowel program last night, bright red blood was present in the stool. Reviewed all these symptoms with Dr Marin Olp, and he suggests that the patient be assessed in the ED. Spoke to wife and she is in agreement.

## 2015-09-25 DIAGNOSIS — Z9359 Other cystostomy status: Secondary | ICD-10-CM

## 2015-09-25 DIAGNOSIS — E440 Moderate protein-calorie malnutrition: Secondary | ICD-10-CM | POA: Insufficient documentation

## 2015-09-25 LAB — GLUCOSE, CAPILLARY
GLUCOSE-CAPILLARY: 178 mg/dL — AB (ref 65–99)
GLUCOSE-CAPILLARY: 194 mg/dL — AB (ref 65–99)
GLUCOSE-CAPILLARY: 204 mg/dL — AB (ref 65–99)
GLUCOSE-CAPILLARY: 218 mg/dL — AB (ref 65–99)
GLUCOSE-CAPILLARY: 296 mg/dL — AB (ref 65–99)
Glucose-Capillary: 207 mg/dL — ABNORMAL HIGH (ref 65–99)
Glucose-Capillary: 212 mg/dL — ABNORMAL HIGH (ref 65–99)

## 2015-09-25 LAB — CBC
HCT: 36.2 % — ABNORMAL LOW (ref 39.0–52.0)
HEMATOCRIT: 36.8 % — AB (ref 39.0–52.0)
HEMOGLOBIN: 11.5 g/dL — AB (ref 13.0–17.0)
Hemoglobin: 11.5 g/dL — ABNORMAL LOW (ref 13.0–17.0)
MCH: 25 pg — AB (ref 26.0–34.0)
MCH: 24.8 pg — ABNORMAL LOW (ref 26.0–34.0)
MCHC: 31.8 g/dL (ref 30.0–36.0)
MCHC: 31.3 g/dL (ref 30.0–36.0)
MCV: 78.7 fL (ref 78.0–100.0)
MCV: 79.3 fL (ref 78.0–100.0)
PLATELETS: 121 10*3/uL — AB (ref 150–400)
Platelets: 123 10*3/uL — ABNORMAL LOW (ref 150–400)
RBC: 4.6 MIL/uL (ref 4.22–5.81)
RBC: 4.64 MIL/uL (ref 4.22–5.81)
RDW: 13.3 % (ref 11.5–15.5)
RDW: 13.2 % (ref 11.5–15.5)
WBC: 4.6 10*3/uL (ref 4.0–10.5)
WBC: 4.6 10*3/uL (ref 4.0–10.5)

## 2015-09-25 LAB — TROPONIN I: Troponin I: <0.03 ng/mL (ref <0.031)

## 2015-09-25 LAB — COMPREHENSIVE METABOLIC PANEL
ALBUMIN: 2.4 g/dL — AB (ref 3.5–5.0)
ALK PHOS: 103 U/L (ref 38–126)
ALT: 20 U/L (ref 17–63)
AST: 31 U/L (ref 15–41)
Anion gap: 7 (ref 5–15)
BUN: 13 mg/dL (ref 6–20)
CALCIUM: 7.9 mg/dL — AB (ref 8.9–10.3)
CO2: 27 mmol/L (ref 22–32)
CREATININE: 0.9 mg/dL (ref 0.61–1.24)
Chloride: 104 mmol/L (ref 101–111)
GFR calc Af Amer: >60 mL/min (ref >60)
GFR calc non Af Amer: >60 mL/min (ref >60)
GLUCOSE: 256 mg/dL — AB (ref 65–99)
Potassium: 3.5 mmol/L (ref 3.5–5.1)
SODIUM: 138 mmol/L (ref 135–145)
Total Bilirubin: 0.5 mg/dL (ref 0.3–1.2)
Total Protein: 6.2 g/dL — ABNORMAL LOW (ref 6.5–8.1)

## 2015-09-25 LAB — TYPE AND SCREEN
ABO/RH(D): A POS
Antibody Screen: NEGATIVE

## 2015-09-25 LAB — TSH: TSH: 1.115 u[IU]/mL (ref 0.350–4.500)

## 2015-09-25 LAB — PHOSPHORUS: Phosphorus: 2.1 mg/dL — ABNORMAL LOW (ref 2.5–4.6)

## 2015-09-25 LAB — URINE CULTURE

## 2015-09-25 LAB — MAGNESIUM: Magnesium: 2.2 mg/dL (ref 1.7–2.4)

## 2015-09-25 LAB — ABO/RH: ABO/RH(D): A POS

## 2015-09-25 MED ORDER — SODIUM CHLORIDE 0.9 % IV SOLN: INTRAVENOUS | Status: DC

## 2015-09-25 MED ORDER — POLYETHYLENE GLYCOL 3350 17 G PO PACK
17.0000 g | PACK | Freq: Four times a day (QID) | ORAL | Status: DC
  Administered 2015-09-25 (×4): 17 g via ORAL
  Filled 2015-09-25 (×7): qty 1

## 2015-09-25 NOTE — Progress Notes (Signed)
PROGRESS NOTE  [name]. JJK:[id number] DOB: 22-Jan-1961 DOA: 09/24/2015 PCP: Volanda Napoleon, MD  HPI/Recap of past 24 hours:  Feeling better this am , bp stabilized.  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 Much improved on ivf and abx. Culture pending. Hold afinitor in the setting of sepsis.  UTI with suprapubic catheter (placed a week ago) and condom catheter. Has chronic bladder irritation due to catheter, does reported urine odor which if new. Continue rocephin. Urine clear in bag. Consider change cardura to flomax due to chronic low bp.  BRBPR: hgb stable, GI following. Colonoscopy vs sigmoidoscopy.  IDDM2, a1c pending, continue insulin, adjust prn.  Chronic orthostatic hypotension; continue midodrine.   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 Per admitting MD, oncology aware and will see patient while he is in the hospital.   Code Status: DNR  Family Communication: patient   Disposition Plan: home when medically ready   Consultants:  GI  Procedures:  Planned for colonoscopy vs sigmoidoscopy  Antibiotics:  rocephin   Objective: BP 121/81 mmHg  Pulse 74  Temp(Src) 99.1 F (37.3 C) (Oral)  Resp 18  Ht 6\' 1"  (1.854 m)  Wt 149 lb 0.5 oz (67.6 kg)  BMI 19.67 kg/m2  SpO2 98%  Intake/Output Summary (Last 24 hours) at 09/25/15 1051 Last data filed at 09/25/15 0600  Gross per 24 hour  Intake  742.5 ml  Output   1475 ml  Net -732.5 ml   Filed Weights   09/24/15 2019  Weight: 149 lb 0.5 oz (67.6 kg)    Exam:   General:  NAD  Cardiovascular: RRR  Respiratory: CTABL  Abdomen: Soft/ND/NT, positive BS, suprapubic catheter  Musculoskeletal: No Edema  Neuro: chronic changes due to  neuroendocrine tumor, paraplegia  Data Reviewed: Basic Metabolic Panel:  Recent Labs Lab 09/24/15 1259 09/25/15 0250  NA 137 138  K 3.5 3.5  CL 101 104  CO2 27 27  GLUCOSE 148* 256*  BUN 16 13  CREATININE 0.93 0.90  CALCIUM 8.2* 7.9*  MG  --  2.2  PHOS  --  2.1*   Liver Function Tests:  Recent Labs Lab 09/24/15 1259 09/25/15 0250  AST 29 31  ALT 19 20  ALKPHOS 112 103  BILITOT 1.2 0.5  PROT 6.6 6.2*  ALBUMIN 2.8* 2.4*   No results for input(s): LIPASE, AMYLASE in the last 168 hours. No results for input(s): AMMONIA in the last 168 hours. CBC:  Recent Labs Lab 09/24/15 1259 09/24/15 2055 09/25/15 0250  WBC 7.5 4.7 4.6  NEUTROABS 6.1  --   --   HGB 12.6* 11.4* 11.5*  HCT 39.7 36.0* 36.2*  MCV 78.0 78.9 78.7  PLT 129* 115* 121*   Cardiac Enzymes:    Recent Labs Lab 09/24/15 2055 09/25/15 0250 09/25/15 0850  TROPONINI <0.03 <0.03 <0.03   BNP (last 3 results) No results for input(s): BNP in the last 8760 hours.  ProBNP (last 3 results) No results for input(s): PROBNP in the last 8760 hours.  CBG:  Recent Labs Lab 09/24/15 1627 09/24/15 2225 09/25/15 0026 09/25/15 0451 09/25/15 0732  GLUCAP 141* 223* 296* 218* 204*    Recent Results (from the past 240 hour(s))  Urine culture  Status: None (Preliminary result)   Collection Time: 09/24/15  1:40 PM  Result Value Ref Range Status   Specimen Description URINE, CATHETERIZED  Final   Special Requests NONE  Final   Culture   Final    CULTURE REINCUBATED FOR BETTER GROWTH Performed at [facility]    Report Status PENDING  Incomplete     Studies: Dg Chest 2 View  09/24/2015  CLINICAL DATA:  Fever starting yesterday, metastatic carcinoma EXAM: CHEST  2 VIEW COMPARISON:  5/17/ 16 FINDINGS: Cardiomediastinal silhouette is stable. No acute infiltrate or pulmonary edema. There is dextroscoliosis of thoracic spine. IMPRESSION: No active cardiopulmonary disease.  Dextroscoliosis thoracic  spine. Electronically Signed   By: Lahoma Crocker M.D.   On: 09/24/2015 13:46    Scheduled Meds: . sodium chloride   Intravenous Once  . baclofen  20-40 mg Oral QID  . cefTRIAXone (ROCEPHIN)  IV  2 g Intravenous Q24H  . dronabinol  5 mg Oral QAC lunch  . insulin aspart  0-9 Units Subcutaneous 6 times per day  . insulin glargine  20 Units Subcutaneous QAC breakfast  . lipase/protease/amylase  72,000 Units Oral TID WC  . montelukast  10 mg Oral QHS  . polyethylene glycol  17 g Oral QID  . sodium chloride  3 mL Intravenous Q12H  . traMADol  50 mg Oral TID    Continuous Infusions:    Time spent: 15mins  Genella Bas MD, PhD  [facility] Pager [phone]. If 7PM-7AM, please contact night-coverage at [email or web address], password [email or web address] 09/25/2015, 10:51 AM  LOS: 1 day

## 2015-09-25 NOTE — Progress Notes (Signed)
Initial Nutrition Assessment  DOCUMENTATION CODES:   Non-severe (moderate) malnutrition in context of chronic illness  INTERVENTION:  - Continue Carb Modified diet - RD will continue to monitor for needs  NUTRITION DIAGNOSIS:   Inadequate oral intake related to acute illness as evidenced by per patient/family report.  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 Presented with 2 day history of watery diarrhea which has now improved. At his baseline patient is status post pancreatectomy and therefore he has some loose stools but never watery this was a change for him. Patient endorses myalgias. He reports some chills as well as fever at home up to 103.8. Wife reports that she has to digitally stimulate him in order to produce a bowel movement today she has noticed there was some bleeding with stool she thinks it was at least a quarter cup of red mixed stool.   Pt seen for low Braden. BMI indicates normal weight. Pt ate 100% sausage patty and omelet with cheese and ham this AM and drank a cup of cranberry juice. He denies abdominal pain or nausea at this time. He states PTA he had a good appetite although he needs to monitor fat intake due to pancreatectomy in 2015 now on Creon and to control CBGs with hx of Type 1 DM; he reports last HgbA1c was taken 2 months ago and was 7.4%. Pt states that he spiked a fever Tuesday and,  therefore, did not eat well Tuesday (10/18) or yesterday (10/19). He was able to eat dinner last night around 2230 which consisted of leftover food from [employer].  He states that prior to pancreatectomy he weighed 202 lbs and dropped to 117 lbs following surgery. He states that UBW recently has been ~165 lbs. Per chart review, pt weighed 149 lbs 08/04/15, gained weight to 164 lbs on 09/02/15 and is now back to 149 lbs; will continue to monitor weight trends. Mild muscle and fat wasting present.  Likely to meet needs with resolution of fever but will continue to monitor for needs. Medications reviewed. Labs reviewed; CBGs: 141-296 mg/dL, Ca: 7.9 mg/dL, Phos: 2.1 mg/dL.   Diet Order:  Diet Carb Modified Fluid consistency:: Thin; Room service appropriate?: Yes Diet clear liquid Room service appropriate?: Yes; Fluid consistency:: Thin  Skin:  Reviewed, no issues  Last BM:  10/19  Height:   Ht Readings from Last 1 Encounters:  09/24/15 6\' 1"  (1.854 m)    Weight:   Wt Readings from Last 1 Encounters:  09/24/15 149 lb 0.5 oz (67.6 kg)    Ideal Body Weight:  83.64 kg (kg)  BMI:  Body mass index is 19.67 kg/(m^2).  Estimated Nutritional Needs:   Kcal:  1700-1900  Protein:  80-90 grams  Fluid:  2-2.2 L/day  EDUCATION NEEDS:   No education needs identified at this time     Jarome Matin, RD, LDN Inpatient Clinical Dietitian Pager # [phone] After hours/weekend pager # [phone]

## 2015-09-25 NOTE — Care Management Note (Signed)
Case Management Note  Patient Details  Name: [name]. MRN: [id number] Date of Birth: [birth year]  Subjective/Objective:  54 y/o m admitted w/hypotension, fever. QI:[id number] Ca. GI cons-BRBPR-colonoscopy.From home.                  Action/Plan:d/c home.   Expected Discharge Date:   (unknown)               Expected Discharge Plan:  Home/Self Care  In-House Referral:     Discharge planning Services  CM Consult  Post Acute Care Choice:    Choice offered to:     DME Arranged:    DME Agency:     HH Arranged:    HH Agency:     Status of Service:  In process, will continue to follow  Medicare Important Message Given:    Date Medicare IM Given:    Medicare IM give by:    Date Additional Medicare IM Given:    Additional Medicare Important Message give by:     If discussed at [place] of Stay Meetings, dates discussed:    Additional Comments:  Dessa Phi, RN 09/25/2015, 3:01 PM

## 2015-09-25 NOTE — Consult Note (Signed)
[facility] CONS[facility] Reason for consult: fever and bright red blood per rectum Referring Physician: [facility]. PCP: Dr Marin Olp. Primary G.I.: Dr. Quentin Cornwall. is an 54 y.o. male.  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 The patient has had neuroendocrine tumor of the pancreas resulting in pylorus sparing Whipple procedure done at the [facility] and a total pancreatectomy. He in addition his head nephrectomy due to renal cancer and has had multiple neurologic cancers resulting in multiple spinal surgeries. 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 He sees Dr. Teena Irani for pancreatic enzyme replacement. The patient was in the hospital about a year ago and had a CT scan suggesting possible structured area in the sigmoid colon and underwent colonoscopy by Dr. Cristina Gong with no structured area seen. The prep was not particularly good but no gross lesions were seen. He has very little feeling in his anal area and has to be digitally stimulated by his wife in order to have bowel movements. He does have abdominal cramping and fairly loose stool because of pancreatic insufficiency. He was admitted to the hospital after a vacation at the beach. He had sudden onset of fever with temperature 203 and shaking chills. He also had some loose stool and bright red blood with wiping. This apparently has cleared up. He does not notice any general change in his bowel movements other than this acute episode. The rectal bleeding this new and has occurred just recently in association with this acute illness. The patient was admitted with sepsis possibly due to UTI and started on antibiotics. He was given fluids. Rest see him about the diarrhea and rectal  bleeding. He denies abdominal pain currently.  Past Medical History  Diagnosis Date  . 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 Diabetes mellitus without complication ([place])   . 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 Headache(784.0)     neuropathic pain usually behind eyes related to blood pressure  . Transfusion history     '09 .   Marland Kitchen Iron deficiency anemia, unspecified 07/10/2014  . HTN (hypertension) 08/19/2014  . 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 Orthostatic hypotension 08/19/2014  . Anemia 08/19/2014    Past Surgical History  Procedure Laterality Date  . Back surgery      '91- T#-T5 "tumor resection"-benign  . Reconstruction breast w/ tram flap    . Muscle flap myocutaneous / fasciocutaneous of trunk Left     '01  . Craniotomy for tumor      medulla tumor "benign tumor"-salvage own bone for closure  . Peripherally inserted central catheter insertion      past hx.  . Enucleation Left     '05 with prosthesis  . Cervical spine surgery      '07- resection tumor C5-C7-"benign"  . Partial nephrectomy Right     '09- cancer  . Pancreas surgery      '09- with partial liver resection/ pancreas "Whipple"  . 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 Insertion of suprapubic catheter N/A 02/04/2014    Procedure: INSERTION OF SUPRAPUBIC CATHETER;  Surgeon: Franchot Gallo, MD;  Location: [facility];  Service: Urology;  Laterality: N/A;  . Cystoscopy with litholapaxy N/A 02/04/2014    Procedure: CYSTOSCOPY WITH LITHOLAPAXY;  Surgeon: Franchot Gallo, MD;  Location: [facility];  Service: Urology;  Laterality: N/A;  . Colonoscopy with propofol N/A 08/20/2014    Procedure: COLONOSCOPY WITH PROPOFOL;  Surgeon: Cleotis Nipper, MD;  Location: [facility];  Service: Endoscopy;  Laterality: N/A;    Family History  Problem Relation Age of Onset   . Heart failure Father     Social History:  reports that he quit smoking about 7 years ago. His smoking use included Cigarettes. He started smoking about 16 years ago. He has a 3 pack-year smoking history. He has never used smokeless tobacco. He reports that he drinks alcohol. He reports that he does not use illicit drugs.  Allergies:  Allergies  Allergen Reactions  . Ciprocin-Fluocin-Procin [Fluocinolone Acetonide] Rash    Pt states this happened with IV Cipro. ABLE TO TAKE PO CIPRO.  . Other Rash    IV-CIPRO    Medications; Prior to Admission medications   Medication Sig Start Date End Date Taking? Authorizing Provider  baclofen (LIORESAL) 20 MG tablet Take 20-40 mg by mouth 4 (four) times daily.     Historical Provider, MD  diazepam (VALIUM) 5 MG tablet TAKE 1 TABLET BY MOUTH 3 TIMES DAILY AS NEEDED FOR MUSCLE SPASMS/ANXIETY 08/18/15   Volanda Napoleon, MD  doxazosin (CARDURA) 1 MG tablet Take 1 tablet (1 mg total) by mouth 2 (two) times daily. Patient taking differently: Take 1 mg by mouth 2 (two) times daily as needed (blood pressure spikes.).  09/03/14   Volanda Napoleon, MD  dronabinol (MARINOL) 5 MG capsule TAKE 1 CAPSULE BY MOUTH TWICE DAILY WITH MEALS 08/13/15   Volanda Napoleon, MD  everolimus (AFINITOR) 10 MG tablet Take 1 tablet (10 mg total) by mouth daily. 07/22/15   Eliezer Bottom, NP  HYDROcodone-acetaminophen (NORCO/VICODIN) 5-325 MG tablet Take 1 tablet by mouth every 6 (six) hours as needed for moderate pain. 09/02/15   Volanda Napoleon, MD  HYDROmorphone (DILAUDID) 4 MG tablet Take 4 mg by mouth every 6 (six) hours as needed for severe pain.    Historical Provider, MD  insulin glargine (LANTUS) 100 UNIT/ML injection Inject 31 Units into the skin daily before breakfast.     Historical Provider, MD  insulin lispro (HUMALOG) 100 UNIT/ML injection Inject 3-9 Units into the skin 3 (three) times daily before meals. SS    Historical Provider, MD  levofloxacin (LEVAQUIN) 500 MG  tablet Take 1 tablet by mouth daily as needed. Fever, uti 09/04/15   Historical Provider, MD  levofloxacin (LEVAQUIN) 750 MG tablet Take 1 tablet (750 mg total) by mouth daily. X 7 days 09/24/15   Deno Etienne, DO  midodrine (PROAMATINE) 5 MG tablet TKAE 1/2 TABLET BY MOUTH TWICE A DAY AS NEEDED Patient taking differently: TKAE 1/2 TABLET BY MOUTH TWICE A DAY AS NEEDED BLOOD PRESSURE. 08/18/15   Volanda Napoleon, MD  montelukast (SINGULAIR) 10 MG tablet Take 1 tablet (10 mg total) by mouth at bedtime. 09/02/15   Volanda Napoleon, MD  Pancrelipase, Lip-Prot-Amyl, 24000 UNITS CPEP Take 3 capsules by mouth 3 (three) times daily with meals.    Historical Provider, MD  promethazine (PHENERGAN) 12.5 MG tablet Take 12.5 mg by mouth every 6 (six) hours as needed for nausea.     Historical Provider, MD  tiZANidine (ZANAFLEX) 4 MG  tablet TAKE 2 TABLETS BY MOUTH EVERY 6 HOURS AS NEEDED FOR PAIN 12/29/14   Volanda Napoleon, MD  traMADol (ULTRAM) 50 MG tablet TAKE 1 TABLET BY MOUTH 3 TIMES DAILY 06/18/15   Volanda Napoleon, MD   . sodium chloride   Intravenous Once  . baclofen  20-40 mg Oral QID  . cefTRIAXone (ROCEPHIN)  IV  2 g Intravenous Q24H  . dronabinol  5 mg Oral QAC lunch  . insulin aspart  0-9 Units Subcutaneous 6 times per day  . insulin glargine  20 Units Subcutaneous QAC breakfast  . lipase/protease/amylase  72,000 Units Oral TID WC  . montelukast  10 mg Oral QHS  . polyethylene glycol  17 g Oral QID  . sodium chloride  3 mL Intravenous Q12H  . traMADol  50 mg Oral TID   PRN Meds acetaminophen **OR** acetaminophen, HYDROcodone-acetaminophen, midodrine, ondansetron **OR** ondansetron (ZOFRAN) IV Results for orders placed or performed during the hospital encounter of 09/24/15 (from the past 48 hour(s))  CBG monitoring, ED     Status: Abnormal   Collection Time: 09/24/15  4:13 PM  Result Value Ref Range   Glucose-Capillary 141 (H) 65 - 99 mg/dL  CBG monitoring, ED     Status: Abnormal   Collection  Time: 09/24/15  4:27 PM  Result Value Ref Range   Glucose-Capillary 141 (H) 65 - 99 mg/dL  I-Stat CG4 Lactic Acid, ED     Status: None   Collection Time: 09/24/15  5:23 PM  Result Value Ref Range   Lactic Acid, Venous 1.30 0.5 - 2.0 mmol/L  I-Stat CG4 Lactic Acid, ED     Status: None   Collection Time: 09/24/15  7:38 PM  Result Value Ref Range   Lactic Acid, Venous 0.61 0.5 - 2.0 mmol/L  Procalcitonin     Status: None   Collection Time: 09/24/15  8:55 PM  Result Value Ref Range   Procalcitonin 0.35 ng/mL    Comment:        Interpretation: PCT (Procalcitonin) <= 0.5 ng/mL: Systemic infection (sepsis) is not likely. Local bacterial infection is possible. (NOTE)         ICU PCT Algorithm               Non ICU PCT Algorithm    ----------------------------     ------------------------------         PCT < 0.25 ng/mL                 PCT < 0.1 ng/mL     Stopping of antibiotics            Stopping of antibiotics       strongly encouraged.               strongly encouraged.    ----------------------------     ------------------------------       PCT level decrease by               PCT < 0.25 ng/mL       >= 80% from peak PCT       OR PCT 0.25 - 0.5 ng/mL          Stopping of antibiotics                                             encouraged.  Stopping of antibiotics           encouraged.    ----------------------------     ------------------------------       PCT level decrease by              PCT >= 0.25 ng/mL       < 80% from peak PCT        AND PCT >= 0.5 ng/mL            Continuin g antibiotics                                              encouraged.       Continuing antibiotics            encouraged.    ----------------------------     ------------------------------     PCT level increase compared          PCT > 0.5 ng/mL         with peak PCT AND          PCT >= 0.5 ng/mL             Escalation of antibiotics                                          strongly encouraged.       Escalation of antibiotics        strongly encouraged.   Troponin I     Status: None   Collection Time: 09/24/15  8:55 PM  Result Value Ref Range   Troponin I <0.03 <0.031 ng/mL    Comment:        NO INDICATION OF MYOCARDIAL INJURY.   CBC     Status: Abnormal   Collection Time: 09/24/15  8:55 PM  Result Value Ref Range   WBC 4.7 4.0 - 10.5 K/uL   RBC 4.56 4.22 - 5.81 MIL/uL   Hemoglobin 11.4 (L) 13.0 - 17.0 g/dL   HCT 36.0 (L) 39.0 - 52.0 %   MCV 78.9 78.0 - 100.0 fL   MCH 25.0 (L) 26.0 - 34.0 pg   MCHC 31.7 30.0 - 36.0 g/dL   RDW 13.1 11.5 - 15.5 %   Platelets 115 (L) 150 - 400 K/uL    Comment: RESULT REPEATED AND VERIFIED SPECIMEN CHECKED FOR CLOTS PLATELET COUNT CONFIRMED BY SMEAR   Glucose, capillary     Status: Abnormal   Collection Time: 09/24/15 10:25 PM  Result Value Ref Range   Glucose-Capillary 223 (H) 65 - 99 mg/dL  Glucose, capillary     Status: Abnormal   Collection Time: 09/25/15 12:26 AM  Result Value Ref Range   Glucose-Capillary 296 (H) 65 - 99 mg/dL  Magnesium     Status: None   Collection Time: 09/25/15  2:50 AM  Result Value Ref Range   Magnesium 2.2 1.7 - 2.4 mg/dL  Phosphorus     Status: Abnormal   Collection Time: 09/25/15  2:50 AM  Result Value Ref Range   Phosphorus 2.1 (L) 2.5 - 4.6 mg/dL  TSH     Status: None   Collection Time: 09/25/15  2:50 AM  Result Value Ref Range   TSH 1.115 0.350 - 4.500 uIU/mL  Comprehensive metabolic panel  Status: Abnormal   Collection Time: 09/25/15  2:50 AM  Result Value Ref Range   Sodium 138 135 - 145 mmol/L   Potassium 3.5 3.5 - 5.1 mmol/L   Chloride 104 101 - 111 mmol/L   CO2 27 22 - 32 mmol/L   Glucose, Bld 256 (H) 65 - 99 mg/dL   BUN 13 6 - 20 mg/dL   Creatinine, Ser 0.90 0.61 - 1.24 mg/dL   Calcium 7.9 (L) 8.9 - 10.3 mg/dL   Total Protein 6.2 (L) 6.5 - 8.1 g/dL   Albumin 2.4 (L) 3.5 - 5.0 g/dL   AST 31 15 - 41 U/L   ALT 20 17 - 63 U/L   Alkaline Phosphatase 103 38 - 126 U/L   Total  Bilirubin 0.5 0.3 - 1.2 mg/dL   GFR calc non Af Amer >60 >60 mL/min   GFR calc Af Amer >60 >60 mL/min    Comment: (NOTE) The eGFR has been calculated using the CKD EPI equation. This calculation has not been validated in all clinical situations. eGFR's persistently <60 mL/min signify possible Chronic Kidney Disease.    Anion gap 7 5 - 15  CBC     Status: Abnormal   Collection Time: 09/25/15  2:50 AM  Result Value Ref Range   WBC 4.6 4.0 - 10.5 K/uL   RBC 4.60 4.22 - 5.81 MIL/uL   Hemoglobin 11.5 (L) 13.0 - 17.0 g/dL   HCT 36.2 (L) 39.0 - 52.0 %   MCV 78.7 78.0 - 100.0 fL   MCH 25.0 (L) 26.0 - 34.0 pg   MCHC 31.8 30.0 - 36.0 g/dL   RDW 13.3 11.5 - 15.5 %   Platelets 121 (L) 150 - 400 K/uL  Troponin I     Status: None   Collection Time: 09/25/15  2:50 AM  Result Value Ref Range   Troponin I <0.03 <0.031 ng/mL    Comment:        NO INDICATION OF MYOCARDIAL INJURY.   Type and screen [place]     Status: None   Collection Time: 09/25/15  2:50 AM  Result Value Ref Range   ABO/RH(D) A POS    Antibody Screen NEG    Sample Expiration 09/28/2015   ABO/Rh     Status: None   Collection Time: 09/25/15  2:50 AM  Result Value Ref Range   ABO/RH(D) A POS   Glucose, capillary     Status: Abnormal   Collection Time: 09/25/15  4:51 AM  Result Value Ref Range   Glucose-Capillary 218 (H) 65 - 99 mg/dL  Glucose, capillary     Status: Abnormal   Collection Time: 09/25/15  7:32 AM  Result Value Ref Range   Glucose-Capillary 204 (H) 65 - 99 mg/dL    Dg Chest 2 View  09/24/2015  CLINICAL DATA:  Fever starting yesterday, metastatic carcinoma EXAM: CHEST  2 VIEW COMPARISON:  5/17/ 16 FINDINGS: Cardiomediastinal silhouette is stable. No acute infiltrate or pulmonary edema. There is dextroscoliosis of thoracic spine. IMPRESSION: No active cardiopulmonary disease.  Dextroscoliosis thoracic spine. Electronically Signed   By: Lahoma Crocker M.D.   On: 09/24/2015 13:46    ROS: please see present illness            Blood pressure 121/81, pulse 74, temperature 99.1 F (37.3 C), temperature source Oral, resp. rate 18, height '6\' 1"'  (1.854 m), weight 67.6 kg (149 lb 0.5 oz), SpO2 98 %.  Physical exam:   General-- pleasant  white male in no acute distress ENT-- nonicteric Neck-- no gross lymphadenopathy Heart-- regular rate and rhythm without murmurs or gallops Lungs-- clear Abdomen-- soft and nontender with normal bowel sounds and no palpable masses Psych-- alert and oriented times 3   Assessment: 1. Rectal bleeding. This is bright red blood with wiping and probably is due to hemorrhoids. It is of new onset in view of this I think he should have colonoscopy/sigmoidoscopy. He had incomplete procedure last year. Due to his underlying genetic defect he could be at risk for the development of colonic neoplasia. Have discussed all this with him. 2. Von-Hippel Lindeau Syndrome. This is resulted in multiple primary cancers as noted above and multiple prior surgeries 3. Status post total pancreatectomy with pancreatic insufficiency and secondary diabetes 4. 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 tract causing this rectal bleeding.   Shirleymae Hauth JR,Jeda Pardue L 09/25/2015, 8:58 AM   Pager: [phone] If no answer or after hours call [phone]

## 2015-09-26 ENCOUNTER — Encounter (HOSPITAL_COMMUNITY): Payer: Self-pay | Admitting: *Deleted

## 2015-09-26 ENCOUNTER — Other Ambulatory Visit (HOSPITAL_COMMUNITY): Payer: 59

## 2015-09-26 ENCOUNTER — Inpatient Hospital Stay (HOSPITAL_COMMUNITY): Payer: 59

## 2015-09-26 ENCOUNTER — Encounter (HOSPITAL_COMMUNITY)
Admission: EM | Disposition: A | Discharge status: Home / Self Care | Payer: Self-pay | Source: Home / Self Care | Attending: Internal Medicine

## 2015-09-26 DIAGNOSIS — R55 Syncope and collapse: Secondary | ICD-10-CM

## 2015-09-26 DIAGNOSIS — D480 Neoplasm of uncertain behavior of bone and articular cartilage: Secondary | ICD-10-CM

## 2015-09-26 DIAGNOSIS — B9620 Unspecified Escherichia coli [E. coli] as the cause of diseases classified elsewhere: Secondary | ICD-10-CM

## 2015-09-26 DIAGNOSIS — Q858 Other phakomatoses, not elsewhere classified: Secondary | ICD-10-CM

## 2015-09-26 DIAGNOSIS — G8220 Paraplegia, unspecified: Secondary | ICD-10-CM

## 2015-09-26 HISTORY — PX: FLEXIBLE SIGMOIDOSCOPY: SHX5431

## 2015-09-26 LAB — GLUCOSE, CAPILLARY
GLUCOSE-CAPILLARY: 161 mg/dL — AB (ref 65–99)
GLUCOSE-CAPILLARY: 78 mg/dL (ref 65–99)
GLUCOSE-CAPILLARY: 91 mg/dL (ref 65–99)
Glucose-Capillary: 131 mg/dL — ABNORMAL HIGH (ref 65–99)
Glucose-Capillary: 253 mg/dL — ABNORMAL HIGH (ref 65–99)
Glucose-Capillary: 280 mg/dL — ABNORMAL HIGH (ref 65–99)

## 2015-09-26 LAB — CBC
HCT: 35.2 % — ABNORMAL LOW (ref 39.0–52.0)
Hemoglobin: 11.1 g/dL — ABNORMAL LOW (ref 13.0–17.0)
MCH: 24.7 pg — AB (ref 26.0–34.0)
MCHC: 31.5 g/dL (ref 30.0–36.0)
MCV: 78.2 fL (ref 78.0–100.0)
PLATELETS: 152 10*3/uL (ref 150–400)
RBC: 4.5 MIL/uL (ref 4.22–5.81)
RDW: 13.2 % (ref 11.5–15.5)
WBC: 5.6 10*3/uL (ref 4.0–10.5)

## 2015-09-26 LAB — BASIC METABOLIC PANEL
ANION GAP: 9 (ref 5–15)
BUN: 10 mg/dL (ref 6–20)
CALCIUM: 8.2 mg/dL — AB (ref 8.9–10.3)
CO2: 29 mmol/L (ref 22–32)
Chloride: 104 mmol/L (ref 101–111)
Creatinine, Ser: 0.72 mg/dL (ref 0.61–1.24)
GFR calc Af Amer: >60 mL/min (ref >60)
GLUCOSE: 120 mg/dL — AB (ref 65–99)
POTASSIUM: 3.3 mmol/L — AB (ref 3.5–5.1)
SODIUM: 142 mmol/L (ref 135–145)

## 2015-09-26 LAB — TROPONIN I
Troponin I: <0.03 ng/mL (ref <0.031)
Troponin I: <0.03 ng/mL (ref <0.031)

## 2015-09-26 LAB — HEMOGLOBIN A1C
Hgb A1c MFr Bld: 8.6 % — ABNORMAL HIGH (ref 4.8–5.6)
MEAN PLASMA GLUCOSE: 200 mg/dL

## 2015-09-26 LAB — MAGNESIUM: MAGNESIUM: 2.1 mg/dL (ref 1.7–2.4)

## 2015-09-26 SURGERY — SIGMOIDOSCOPY, FLEXIBLE
Anesthesia: Moderate Sedation

## 2015-09-26 MED ORDER — VITAMINS A & D EX OINT
TOPICAL_OINTMENT | CUTANEOUS | Status: AC
  Administered 2015-09-26: 1
  Filled 2015-09-26: qty 5

## 2015-09-26 MED ORDER — FENTANYL CITRATE (PF) 100 MCG/2ML IJ SOLN
INTRAMUSCULAR | Status: AC
Start: 2015-09-26 — End: 2015-09-26
  Filled 2015-09-26: qty 2

## 2015-09-26 MED ORDER — DIPHENHYDRAMINE HCL 50 MG/ML IJ SOLN
INTRAMUSCULAR | Status: AC
  Filled 2015-09-26: qty 1

## 2015-09-26 MED ORDER — TIZANIDINE HCL 4 MG PO TABS
4.0000 mg | ORAL_TABLET | Freq: Four times a day (QID) | ORAL | Status: DC | PRN
  Administered 2015-09-26: 4 mg via ORAL
  Filled 2015-09-26 (×3): qty 1

## 2015-09-26 MED ORDER — POTASSIUM CHLORIDE CRYS ER 20 MEQ PO TBCR
40.0000 meq | EXTENDED_RELEASE_TABLET | Freq: Once | ORAL | Status: AC
  Administered 2015-09-26: 40 meq via ORAL
  Filled 2015-09-26: qty 2

## 2015-09-26 MED ORDER — MIDAZOLAM HCL 5 MG/5ML IJ SOLN
INTRAMUSCULAR | Status: DC | PRN
Start: 2015-09-26 — End: 2015-09-26
  Administered 2015-09-26: 2 mg via INTRAVENOUS

## 2015-09-26 MED ORDER — DEXTROSE-NACL 5-0.45 % IV SOLN
INTRAVENOUS | Status: DC
  Administered 2015-09-26: 05:00:00 via INTRAVENOUS

## 2015-09-26 MED ORDER — MIDAZOLAM HCL 5 MG/ML IJ SOLN
INTRAMUSCULAR | Status: AC
  Filled 2015-09-26: qty 2

## 2015-09-26 MED ORDER — FENTANYL CITRATE (PF) 100 MCG/2ML IJ SOLN
INTRAMUSCULAR | Status: DC | PRN
  Administered 2015-09-26: 25 ug via INTRAVENOUS

## 2015-09-26 NOTE — Progress Notes (Signed)
PROGRESS NOTE  [name]. GMW:[id number] DOB: 1961-07-30 DOA: 09/24/2015 PCP: Volanda Napoleon, MD  HPI/Recap of past 24 hours:  Feeling better this am , bp stabilized. 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  Much improved on ivf and abx. Culture pending. Hold afinitor in the setting of sepsis.  UTI with suprapubic catheter (placed a week ago) and condom catheter. Has chronic bladder irritation due to catheter, does reported urine odor which if new. Continue rocephin. Urine clear in bag. Consider change cardura to flomax due to chronic low bp.  BRBPR: hgb stable, GI following. Colonoscopy vs sigmoidoscopy.  IDDM2, a1c pending, continue insulin, adjust prn.  Chronic orthostatic hypotension; continue midodrine.   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 Per admitting MD, oncology aware and will see patient while he is in the hospital.   Code Status: DNR  Family Communication: patient   Disposition Plan: home when medically ready, likely 10/22   Consultants:  GI  oncology  Procedures:  Sigmoidoscopy 10/21  Antibiotics:  rocephin   Objective: BP 123/92 mmHg  Pulse 79  Temp(Src) 98 F (36.7 C) (Oral)  Resp 15  Ht 6\' 1"  (1.854 m)  Wt 149 lb (67.586 kg)  BMI 19.66 kg/m2  SpO2 100%  Intake/Output Summary (Last 24 hours) at 09/26/15 1902 Last data filed at 09/26/15 1806  Gross per 24 hour  Intake      0 ml  Output   3150 ml  Net  -3150 ml   Filed Weights   09/24/15 2019 09/26/15 0947  Weight: 149 lb 0.5 oz (67.6 kg) 149 lb (67.586 kg)    Exam:   General:  NAD  Cardiovascular: RRR  Respiratory: CTABL  Abdomen:  Soft/ND/NT, positive BS, suprapubic catheter  Musculoskeletal: No Edema  Neuro: chronic changes due to neuroendocrine tumor, paraplegia  Data Reviewed: Basic Metabolic Panel:  Recent Labs Lab 09/24/15 1259 09/25/15 0250 09/26/15 0220  NA 137 138 142  K 3.5 3.5 3.3*  CL 101 104 104  CO2 27 27 29   GLUCOSE 148* 256* 120*  BUN 16 13 10   CREATININE 0.93 0.90 0.72  CALCIUM 8.2* 7.9* 8.2*  MG  --  2.2 2.1  PHOS  --  2.1*  --    Liver Function Tests:  Recent Labs Lab 09/24/15 1259 09/25/15 0250  AST 29 31  ALT 19 20  ALKPHOS 112 103  BILITOT 1.2 0.5  PROT 6.6 6.2*  ALBUMIN 2.8* 2.4*   No results for input(s): LIPASE, AMYLASE in the last 168 hours. No results for input(s): AMMONIA in the last 168 hours. CBC:  Recent Labs Lab 09/24/15 1259 09/24/15 2055 09/25/15 0250 09/25/15 1235 09/26/15 0220  WBC 7.5 4.7 4.6 4.6 5.6  NEUTROABS 6.1  --   --   --   --   HGB 12.6* 11.4* 11.5* 11.5* 11.1*  HCT 39.7 36.0* 36.2* 36.8* 35.2*  MCV 78.0 78.9 78.7 79.3 78.2  PLT 129* 115* 121* 123* 152   Cardiac Enzymes:    Recent Labs Lab 09/24/15 2055 09/25/15 0250 09/25/15 0850 09/26/15 0220 09/26/15 1409  TROPONINI <0.03 <0.03 <0.03 <0.03 <0.03   BNP (last 3 results)  No results for input(s): BNP in the last 8760 hours.  ProBNP (last 3 results) No results for input(s): PROBNP in the last 8760 hours.  CBG:  Recent Labs Lab 09/26/15 0013 09/26/15 0449 09/26/15 0753 09/26/15 1234 09/26/15 1614  GLUCAP 161* 78 91 131* 253*    Recent Results (from the past 240 hour(s))  Culture, blood (routine x 2)     Status: None (Preliminary result)   Collection Time: 09/24/15  1:09 PM  Result Value Ref Range Status   Specimen Description LEFT ANTECUBITAL  Final   Special Requests BOTTLES DRAWN AEROBIC AND ANAEROBIC 5CC  Final   Culture   Final    NO GROWTH < 24 HOURS Performed at [facility]    Report Status PENDING  Incomplete  Culture, blood (routine x 2)      Status: None (Preliminary result)   Collection Time: 09/24/15  1:35 PM  Result Value Ref Range Status   Specimen Description BLOOD BLOOD LEFT WRIST  Final   Special Requests BOTTLES DRAWN AEROBIC AND ANAEROBIC 5CC  Final   Culture   Final    NO GROWTH < 24 HOURS Performed at [facility]    Report Status PENDING  Incomplete  Urine culture     Status: None   Collection Time: 09/24/15  1:40 PM  Result Value Ref Range Status   Specimen Description URINE, CATHETERIZED  Final   Special Requests NONE  Final   Culture   Final    MULTIPLE SPECIES PRESENT, SUGGEST RECOLLECTION Performed at [facility]    Report Status 09/25/2015 FINAL  Final     Studies: No results found.  Scheduled Meds: . baclofen  20-40 mg Oral QID  . cefTRIAXone (ROCEPHIN)  IV  2 g Intravenous Q24H  . dronabinol  5 mg Oral QAC lunch  . insulin aspart  0-9 Units Subcutaneous 6 times per day  . insulin glargine  20 Units Subcutaneous QAC breakfast  . lipase/protease/amylase  72,000 Units Oral TID WC  . montelukast  10 mg Oral QHS  . sodium chloride  3 mL Intravenous Q12H  . traMADol  50 mg Oral TID    Continuous Infusions:    Time spent: 21mins  Marleni Gallardo MD, PhD  [facility] Pager [phone]. If 7PM-7AM, please contact night-coverage at [email or web address], password [email or web address] 09/26/2015, 7:02 PM  LOS: 2 days

## 2015-09-26 NOTE — Interval H&P Note (Signed)
History and Physical Interval Note:  09/26/2015 10:43 AM  [name].  has presented today for surgery, with the diagnosis of rectal bleed  The various methods of treatment have been discussed with the patient and family. After consideration of risks, benefits and other options for treatment, the patient has consented to  Procedure(s): COLONOSCOPY (N/A) as a surgical intervention .  The patient's history has been reviewed, patient examined, no change in status, stable for surgery.  I have reviewed the patient's chart and labs.  Questions were answered to the patient's satisfaction.     Dannae Kato JR,Kalena Mander L

## 2015-09-26 NOTE — Progress Notes (Signed)
Echocardiogram 2D Echocardiogram has been performed.  Joelene Millin 09/26/2015, 2:46 PM

## 2015-09-26 NOTE — Op Note (Signed)
[facility] [place], [address]   COLONOSCOPY PROCEDURE REPORT  PATIENT: [name], [name]  MR#: [id number] BIRTHDATE: 1961-10-22 , 23  yrs. old GENDER: male ENDOSCOPIST: Laurence Spates, MD REFERRED BY:  Dr. Dr Marin Olp PROCEDURE DATE:  10/02/15 PROCEDURE:   Flexible Sigmoidoscopy ASA CLASS:   class III INDICATIONS:paraplegic with multiple problems with bright red blood per rectum MEDICATIONS: fentanyl 25 g, versed 2 mg IV  DESCRIPTION OF PROCEDURE:   After the risks and benefits and of the procedure were explained, informed consent was obtained.  digital exam revealed liquid brown stool         The Pentax Adult Colonscope [id number]  endoscope was introduced through the anus and advanced to the  descending: approximately 50 cm from the anus .  The quality of the prep was  poor. The patient had been prepped with Miralax in tap water enemas      .  The instrument was then slowly withdrawn as the colon was fully examined. Estimated blood loss is zero unless otherwise noted in this procedure report.   there was sticky adherence tools throughout the colon. The mucosa of the descending and sigmoid colon is completely normal with no evidence of colitis. No gross lesions were saying and there was no sign of active bleeding. Some liquid stool from the descending colon was sucked through the scope to test for FOB. There were large internal an extra hemorrhoids seen upon removal of the scope                 The scope was then withdrawn from the patient and the procedure completed.  WITHDRAWAL TIME:  COMPLICATIONS: There were no immediate complications. ENDOSCOPIC IMPRESSION: 1. Rectal Bleeding. Probably due to internal hemorrhoids RECOMMENDATIONS: resume diet lifestyle followed clinically  REPEAT EXAM:  cc:Dr. Dr Marin Olp  _______________________________ eSigned:  Laurence Spates, MD 10/02/15 11:35 AM   CPT CODES: ICD CODES:  The ICD and CPT  codes recommended by this software are interpretations from the data that the clinical staff has captured with the software.  The verification of the translation of this report to the ICD and CPT codes and modifiers is the sole responsibility of the health care institution and practicing physician where this report was generated.  [employer] will not be held responsible for the validity of the ICD and CPT codes included on this report.  AMA assumes no liability for data contained or not contained herein. CPT is a Designer, television/film set of the [employer].

## 2015-09-26 NOTE — Consult Note (Signed)
Referral MD  Reason for Referral: Metastatic neuroendocrine tumors. 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 Hypotension  : I had severe chills and a high temperature. I think I had another urinary infection.  HPI: [name] is well-known to me. He is a 54 year old white male. He has metastatic neuroendocrine carcinomas. He has the von Hippel-Lindau Lindau syndrome. [facility]'s [facility] in [facility] past.  He has CNS metastases. These been doing fairly well.  He is paraplegic because of spinal cord involvement by one of his tumors.  He has a indwelling suprapubic Foley catheter. He often gets urinary tract infections. These are often Escherichia coli.  He recently had progression of his malignancy. Because of this, we started him on an oral agent called Afinitor. He has tolerated this fairly well so far.  His wife call the office recently. Temperature 103.8 with chills and Reiger's. He went to the emergency room. They felt that he had an urinary tract infection. They treated him with IV antibiotics and were said to let him go. He then had hypertension. He was admitted.  He is not neutropenic. His white cell count was 4.6. Hemoglobin 11.5 area and his electrolytes all looked okay. His blood sugar has been on the high side.  So far, cultures are unremarkable. He is on IV antibiotics prophylactically.  He has a having some bright red blood per rectum. He is being endoscoped today by Dr. Oletta Lamas.  He's had episodes of fever and chills in the past. Again these typically have been associated with urinary tract infections.  He's been eating okay. He's had no mouth sores The typical side effect of Afinitor is mucositis.       Past Medical History  Diagnosis Date  . Von Hippel-Lindau syndrome ([place])     genetic disease that causes tumors  . Diabetes mellitus without  complication ([place])   . Neuroendocrine carcinoma metastatic to liver ([place])   . 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 Headache(784.0)     neuropathic pain usually behind eyes related to blood pressure  . Transfusion history     '09 .   Marland Kitchen Iron deficiency anemia, unspecified 07/10/2014  . HTN (hypertension) 08/19/2014  . 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 Orthostatic hypotension 08/19/2014  . Anemia 08/19/2014  :  Past Surgical History  Procedure Laterality Date  . Back surgery      '91- T#-T5 "tumor resection"-benign  . Reconstruction breast w/ tram flap    . Muscle flap myocutaneous / fasciocutaneous of trunk Left     '01  . Craniotomy for tumor      medulla tumor "benign tumor"-salvage own bone for closure  . Peripherally inserted central catheter insertion      past hx.  . Enucleation Left     '05 with prosthesis  . Cervical spine surgery      '07- resection tumor C5-C7-"benign"  . Partial nephrectomy Right     '09- cancer  . Pancreas surgery      '09- with partial liver resection/ pancreas "[place]"  . 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 Insertion of suprapubic catheter N/A 02/04/2014    Procedure: INSERTION OF SUPRAPUBIC CATHETER;  Surgeon: Franchot Gallo, MD;  Location: [facility]S;  Service: Urology;  Laterality: N/A;  . Cystoscopy with litholapaxy  N/A 02/04/2014    Procedure: CYSTOSCOPY WITH LITHOLAPAXY;  Surgeon: Franchot Gallo, MD;  Location: [facility];  Service: Urology;  Laterality: N/A;  . Colonoscopy with propofol N/A 08/20/2014    Procedure: COLONOSCOPY WITH PROPOFOL;  Surgeon: Cleotis Nipper, MD;  Location: [facility] ENDOSCOPY;  Service: Endoscopy;  Laterality: N/A;  :   Current facility-administered medications:  .  0.9 %  sodium chloride infusion, , Intravenous, Once, Harvel Quale, MD .  0.9 %  sodium chloride infusion, , Intravenous, Continuous, Laurence Spates, MD, Last Rate: 20 mL/hr at 09/25/15  1200 .  acetaminophen (TYLENOL) tablet 650 mg, 650 mg, Oral, Q6H PRN **OR** acetaminophen (TYLENOL) suppository 650 mg, 650 mg, Rectal, Q6H PRN, Toy Baker, MD .  baclofen (LIORESAL) tablet 20-40 mg, 20-40 mg, Oral, QID, Toy Baker, MD, 20 mg at 09/25/15 2312 .  cefTRIAXone (ROCEPHIN) 2 g in dextrose 5 % 50 mL IVPB, 2 g, Intravenous, Q24H, Toy Baker, MD, 2 g at 09/25/15 2312 .  dextrose 5 %-0.45 % sodium chloride infusion, , Intravenous, Continuous, Rhetta Mura Schorr, NP, Last Rate: 50 mL/hr at 09/26/15 0519 .  dronabinol (MARINOL) capsule 5 mg, 5 mg, Oral, QAC lunch, Toy Baker, MD, 5 mg at 09/25/15 1215 .  HYDROcodone-acetaminophen (NORCO/VICODIN) 5-325 MG per tablet 1 tablet, 1 tablet, Oral, Q6H PRN, Toy Baker, MD, 1 tablet at 09/25/15 2012 .  insulin aspart (novoLOG) injection 0-9 Units, 0-9 Units, Subcutaneous, 6 times per day, Toy Baker, MD, 2 Units at 09/26/15 0026 .  insulin glargine (LANTUS) injection 20 Units, 20 Units, Subcutaneous, QAC breakfast, Toy Baker, MD, 20 Units at 09/25/15 0842 .  lipase/protease/amylase (CREON) capsule 72,000 Units, 72,000 Units, Oral, TID WC, Toy Baker, MD, 72,000 Units at 09/25/15 1638 .  midodrine (PROAMATINE) tablet 2.5 mg, 2.5 mg, Oral, BID PRN, Toy Baker, MD .  montelukast (SINGULAIR) tablet 10 mg, 10 mg, Oral, QHS, Toy Baker, MD, 10 mg at 09/25/15 2312 .  ondansetron (ZOFRAN) tablet 4 mg, 4 mg, Oral, Q6H PRN **OR** ondansetron (ZOFRAN) injection 4 mg, 4 mg, Intravenous, Q6H PRN, Toy Baker, MD .  polyethylene glycol (MIRALAX / GLYCOLAX) packet 17 g, 17 g, Oral, QID, Laurence Spates, MD, 17 g at 09/25/15 2312 .  sodium chloride 0.9 % injection 3 mL, 3 mL, Intravenous, Q12H, Toy Baker, MD, 3 mL at 09/24/15 2208 .  traMADol (ULTRAM) tablet 50 mg, 50 mg, Oral, TID, Toy Baker, MD, 50 mg at 09/25/15 2312:  . sodium chloride   Intravenous Once  .  baclofen  20-40 mg Oral QID  . cefTRIAXone (ROCEPHIN)  IV  2 g Intravenous Q24H  . dronabinol  5 mg Oral QAC lunch  . insulin aspart  0-9 Units Subcutaneous 6 times per day  . insulin glargine  20 Units Subcutaneous QAC breakfast  . lipase/protease/amylase  72,000 Units Oral TID WC  . montelukast  10 mg Oral QHS  . polyethylene glycol  17 g Oral QID  . sodium chloride  3 mL Intravenous Q12H  . traMADol  50 mg Oral TID  :  Allergies  Allergen Reactions  . Ciprocin-Fluocin-Procin [Fluocinolone Acetonide] Rash    Pt states this happened with IV Cipro. ABLE TO TAKE PO CIPRO.  . Other Rash    IV-CIPRO  :  Family History  Problem Relation Age of Onset  . Heart failure Father   :  Social History   Social History  . Marital Status: Married    Spouse Name: N/A  . Number of  Children: N/A  . Years of Education: N/A   Occupational History  . Not on file.   Social History Main Topics  . Smoking status: Former Smoker -- 0.50 packs/day for 6 years    Types: Cigarettes    Start date: 01/15/1999    Quit date: 09/23/2008  . Smokeless tobacco: Never Used     Comment: quit 6 years ago  . Alcohol Use: 0.0 oz/week    0 Standard drinks or equivalent per week     Comment: only rare occasions  . Drug Use: No  . Sexual Activity: No   Other Topics Concern  . Not on file   Social History Narrative  :  Pertinent items are noted in HPI.  Exam: Patient Vitals for the past 24 hrs:  BP Temp Temp src Pulse Resp SpO2  09/26/15 0451 113/71 mmHg 97.6 F (36.4 C) Oral 72 18 99 %  09/25/15 2044 (!) 155/75 mmHg 99.4 F (37.4 C) Oral 93 18 96 %  09/25/15 1417 (!) 143/99 mmHg 98.1 F (36.7 C) Oral 86 18 100 %    well-developed and well-nourished white gentleman. Head and neck exam shows no ocular or oral lesions. He has no mucositis. He has no adenopathy in the neck. Lungs are clear. Cardiac exam regular rate and rhythm with no murmurs, rubs or bruits. Abdomen is soft. Bowel sounds are  present. He has no guarding or rebound tenderness. He has well-healed laparotomy scars. There is no obvious abdominal mass. Extremities shows no strength in his legs because of the paraplegia. Skin exam shows no rashes.    Recent Labs  09/25/15 1235 09/26/15 0220  WBC 4.6 5.6  HGB 11.5* 11.1*  HCT 36.8* 35.2*  PLT 123* 152    Recent Labs  09/25/15 0250 09/26/15 0220  NA 138 142  K 3.5 3.3*  CL 104 104  CO2 27 29  GLUCOSE 256* 120*  BUN 13 10  CREATININE 0.90 0.72  CALCIUM 7.9* 8.2*    Blood smear review:  None  Pathology: None     Assessment and Plan:  [name] is a 54 year old gelling. He has Von Hippel Landau syndrome. He has metastatic neuroendocrine carcinomas. These have been low-grade area and he now is on Afinitor.  I still think that infection is most likely etiology for his fever and chills. I know that he has been culture negative so far. He has had problems with Escherichia coli in his urine. The Escherichia coli has become more resistant.  I think that they will lower endoscopy will be helpful. Mitral he would be having bright red blood per rectum.  I do think that an echocardiogram may not be a bad idea. I don't hear any murmurs. However, with his hypotension, I think it would be worthwhile checking his cardiac function.  I appreciate everybody's care of [name]. His wife was with [place] this morning. She is not occupational therapist and the [facility] system.  I will follow along.  Pete E.  2 Timothy 1:7

## 2015-09-26 NOTE — H&P (View-Only) (Signed)
[facility] CONSULT Reason for consult: fever and bright red blood per rectum Referring Physician: [facility]. PCP: Dr Marin Olp. Primary G.I.: Dr. Quentin Cornwall. is an 54 y.o. male.  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 The patient has had neuroendocrine tumor of the pancreas resulting in pylorus sparing Whipple procedure done at the [facility] and a total pancreatectomy. He in addition his head nephrectomy due to renal cancer and has had multiple neurologic cancers resulting in multiple spinal surgeries. 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 He sees Dr. Teena Irani for pancreatic enzyme replacement. The patient was in the hospital about a year ago and had a CT scan suggesting possible structured area in the sigmoid colon and underwent colonoscopy by Dr. Cristina Gong with no structured area seen. The prep was not particularly good but no gross lesions were seen. He has very little feeling in his anal area and has to be digitally stimulated by his wife in order to have bowel movements. He does have abdominal cramping and fairly loose stool because of pancreatic insufficiency. He was admitted to the hospital after a vacation at the beach. He had sudden onset of fever with temperature 203 and shaking chills. He also had some loose stool and bright red blood with wiping. This apparently has cleared up. He does not notice any general change in his bowel movements other than this acute episode. The rectal bleeding this new and has occurred just recently in association with this acute illness. The patient was admitted with sepsis possibly due to UTI and started on antibiotics. He was given fluids. Rest see him about the diarrhea and rectal  bleeding. He denies abdominal pain currently.  Past Medical History  Diagnosis Date  . Von Hippel-Lindau syndrome ([place])     genetic disease that causes tumors  . Diabetes mellitus without complication ([place])   . 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 Headache(784.0)     neuropathic pain usually behind eyes related to blood pressure  . Transfusion history     '09 .   Marland Kitchen Iron deficiency anemia, unspecified 07/10/2014  . HTN (hypertension) 08/19/2014  . 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 Orthostatic hypotension 08/19/2014  . Anemia 08/19/2014    Past Surgical History  Procedure Laterality Date  . Back surgery      '91- T#-T5 "tumor resection"-benign  . Reconstruction breast w/ tram flap    . Muscle flap myocutaneous / fasciocutaneous of trunk Left     '01  . Craniotomy for tumor      medulla tumor "benign tumor"-salvage own bone for closure  . Peripherally inserted central catheter insertion      past hx.  . Enucleation Left     '05 with prosthesis  . Cervical spine surgery      '07- resection tumor C5-C7-"benign"  . Partial nephrectomy Right     '09- cancer  . Pancreas surgery      '09- with partial liver resection/ pancreas "Whipple"  . 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 Insertion of suprapubic catheter N/A 02/04/2014    Procedure: INSERTION OF SUPRAPUBIC CATHETER;  Surgeon: Franchot Gallo, MD;  Location: [facility];  Service: Urology;  Laterality: N/A;  . Cystoscopy with litholapaxy N/A 02/04/2014    Procedure: CYSTOSCOPY WITH LITHOLAPAXY;  Surgeon: Franchot Gallo, MD;  Location: [facility];  Service: Urology;  Laterality: N/A;  . Colonoscopy with propofol N/A 08/20/2014    Procedure: COLONOSCOPY WITH PROPOFOL;  Surgeon: Cleotis Nipper, MD;  Location: [facility];  Service: Endoscopy;  Laterality: N/A;    Family History  Problem Relation Age of Onset   . Heart failure Father     Social History:  reports that he quit smoking about 7 years ago. His smoking use included Cigarettes. He started smoking about 16 years ago. He has a 3 pack-year smoking history. He has never used smokeless tobacco. He reports that he drinks alcohol. He reports that he does not use illicit drugs.  Allergies:  Allergies  Allergen Reactions  . Ciprocin-Fluocin-Procin [Fluocinolone Acetonide] Rash    Pt states this happened with IV Cipro. ABLE TO TAKE PO CIPRO.  . Other Rash    IV-CIPRO    Medications; Prior to Admission medications   Medication Sig Start Date End Date Taking? Authorizing Provider  baclofen (LIORESAL) 20 MG tablet Take 20-40 mg by mouth 4 (four) times daily.     Historical Provider, MD  diazepam (VALIUM) 5 MG tablet TAKE 1 TABLET BY MOUTH 3 TIMES DAILY AS NEEDED FOR MUSCLE SPASMS/ANXIETY 08/18/15   Volanda Napoleon, MD  doxazosin (CARDURA) 1 MG tablet Take 1 tablet (1 mg total) by mouth 2 (two) times daily. Patient taking differently: Take 1 mg by mouth 2 (two) times daily as needed (blood pressure spikes.).  09/03/14   Volanda Napoleon, MD  dronabinol (MARINOL) 5 MG capsule TAKE 1 CAPSULE BY MOUTH TWICE DAILY WITH MEALS 08/13/15   Volanda Napoleon, MD  everolimus (AFINITOR) 10 MG tablet Take 1 tablet (10 mg total) by mouth daily. 07/22/15   Eliezer Bottom, NP  HYDROcodone-acetaminophen (NORCO/VICODIN) 5-325 MG tablet Take 1 tablet by mouth every 6 (six) hours as needed for moderate pain. 09/02/15   Volanda Napoleon, MD  HYDROmorphone (DILAUDID) 4 MG tablet Take 4 mg by mouth every 6 (six) hours as needed for severe pain.    Historical Provider, MD  insulin glargine (LANTUS) 100 UNIT/ML injection Inject 31 Units into the skin daily before breakfast.     Historical Provider, MD  insulin lispro (HUMALOG) 100 UNIT/ML injection Inject 3-9 Units into the skin 3 (three) times daily before meals. SS    Historical Provider, MD  levofloxacin (LEVAQUIN) 500 MG  tablet Take 1 tablet by mouth daily as needed. Fever, uti 09/04/15   Historical Provider, MD  levofloxacin (LEVAQUIN) 750 MG tablet Take 1 tablet (750 mg total) by mouth daily. X 7 days 09/24/15   Deno Etienne, DO  midodrine (PROAMATINE) 5 MG tablet TKAE 1/2 TABLET BY MOUTH TWICE A DAY AS NEEDED Patient taking differently: TKAE 1/2 TABLET BY MOUTH TWICE A DAY AS NEEDED BLOOD PRESSURE. 08/18/15   Volanda Napoleon, MD  montelukast (SINGULAIR) 10 MG tablet Take 1 tablet (10 mg total) by mouth at bedtime. 09/02/15   Volanda Napoleon, MD  Pancrelipase, Lip-Prot-Amyl, 24000 UNITS CPEP Take 3 capsules by mouth 3 (three) times daily with meals.    Historical Provider, MD  promethazine (PHENERGAN) 12.5 MG tablet Take 12.5 mg by mouth every 6 (six) hours as needed for nausea.     Historical Provider, MD  tiZANidine (ZANAFLEX) 4 MG  tablet TAKE 2 TABLETS BY MOUTH EVERY 6 HOURS AS NEEDED FOR PAIN 12/29/14   Volanda Napoleon, MD  traMADol (ULTRAM) 50 MG tablet TAKE 1 TABLET BY MOUTH 3 TIMES DAILY 06/18/15   Volanda Napoleon, MD   . sodium chloride   Intravenous Once  . baclofen  20-40 mg Oral QID  . cefTRIAXone (ROCEPHIN)  IV  2 g Intravenous Q24H  . dronabinol  5 mg Oral QAC lunch  . insulin aspart  0-9 Units Subcutaneous 6 times per day  . insulin glargine  20 Units Subcutaneous QAC breakfast  . lipase/protease/amylase  72,000 Units Oral TID WC  . montelukast  10 mg Oral QHS  . polyethylene glycol  17 g Oral QID  . sodium chloride  3 mL Intravenous Q12H  . traMADol  50 mg Oral TID   PRN Meds acetaminophen **OR** acetaminophen, HYDROcodone-acetaminophen, midodrine, ondansetron **OR** ondansetron (ZOFRAN) IV Results for orders placed or performed during the hospital encounter of 09/24/15 (from the past 48 hour(s))  CBG monitoring, ED     Status: Abnormal   Collection Time: 09/24/15  4:13 PM  Result Value Ref Range   Glucose-Capillary 141 (H) 65 - 99 mg/dL  CBG monitoring, ED     Status: Abnormal   Collection  Time: 09/24/15  4:27 PM  Result Value Ref Range   Glucose-Capillary 141 (H) 65 - 99 mg/dL  I-Stat CG4 Lactic Acid, ED     Status: None   Collection Time: 09/24/15  5:23 PM  Result Value Ref Range   Lactic Acid, Venous 1.30 0.5 - 2.0 mmol/L  I-Stat CG4 Lactic Acid, ED     Status: None   Collection Time: 09/24/15  7:38 PM  Result Value Ref Range   Lactic Acid, Venous 0.61 0.5 - 2.0 mmol/L  Procalcitonin     Status: None   Collection Time: 09/24/15  8:55 PM  Result Value Ref Range   Procalcitonin 0.35 ng/mL    Comment:        Interpretation: PCT (Procalcitonin) <= 0.5 ng/mL: Systemic infection (sepsis) is not likely. Local bacterial infection is possible. (NOTE)         ICU PCT Algorithm               Non ICU PCT Algorithm    ----------------------------     ------------------------------         PCT < 0.25 ng/mL                 PCT < 0.1 ng/mL     Stopping of antibiotics            Stopping of antibiotics       strongly encouraged.               strongly encouraged.    ----------------------------     ------------------------------       PCT level decrease by               PCT < 0.25 ng/mL       >= 80% from peak PCT       OR PCT 0.25 - 0.5 ng/mL          Stopping of antibiotics                                             encouraged.  Stopping of antibiotics           encouraged.    ----------------------------     ------------------------------       PCT level decrease by              PCT >= 0.25 ng/mL       < 80% from peak PCT        AND PCT >= 0.5 ng/mL            Continuin g antibiotics                                              encouraged.       Continuing antibiotics            encouraged.    ----------------------------     ------------------------------     PCT level increase compared          PCT > 0.5 ng/mL         with peak PCT AND          PCT >= 0.5 ng/mL             Escalation of antibiotics                                          strongly encouraged.       Escalation of antibiotics        strongly encouraged.   Troponin I     Status: None   Collection Time: 09/24/15  8:55 PM  Result Value Ref Range   Troponin I <0.03 <0.031 ng/mL    Comment:        NO INDICATION OF MYOCARDIAL INJURY.   CBC     Status: Abnormal   Collection Time: 09/24/15  8:55 PM  Result Value Ref Range   WBC 4.7 4.0 - 10.5 K/uL   RBC 4.56 4.22 - 5.81 MIL/uL   Hemoglobin 11.4 (L) 13.0 - 17.0 g/dL   HCT 36.0 (L) 39.0 - 52.0 %   MCV 78.9 78.0 - 100.0 fL   MCH 25.0 (L) 26.0 - 34.0 pg   MCHC 31.7 30.0 - 36.0 g/dL   RDW 13.1 11.5 - 15.5 %   Platelets 115 (L) 150 - 400 K/uL    Comment: RESULT REPEATED AND VERIFIED SPECIMEN CHECKED FOR CLOTS PLATELET COUNT CONFIRMED BY SMEAR   Glucose, capillary     Status: Abnormal   Collection Time: 09/24/15 10:25 PM  Result Value Ref Range   Glucose-Capillary 223 (H) 65 - 99 mg/dL  Glucose, capillary     Status: Abnormal   Collection Time: 09/25/15 12:26 AM  Result Value Ref Range   Glucose-Capillary 296 (H) 65 - 99 mg/dL  Magnesium     Status: None   Collection Time: 09/25/15  2:50 AM  Result Value Ref Range   Magnesium 2.2 1.7 - 2.4 mg/dL  Phosphorus     Status: Abnormal   Collection Time: 09/25/15  2:50 AM  Result Value Ref Range   Phosphorus 2.1 (L) 2.5 - 4.6 mg/dL  TSH     Status: None   Collection Time: 09/25/15  2:50 AM  Result Value Ref Range   TSH 1.115 0.350 - 4.500 uIU/mL  Comprehensive metabolic panel  Status: Abnormal   Collection Time: 09/25/15  2:50 AM  Result Value Ref Range   Sodium 138 135 - 145 mmol/L   Potassium 3.5 3.5 - 5.1 mmol/L   Chloride 104 101 - 111 mmol/L   CO2 27 22 - 32 mmol/L   Glucose, Bld 256 (H) 65 - 99 mg/dL   BUN 13 6 - 20 mg/dL   Creatinine, Ser 0.90 0.61 - 1.24 mg/dL   Calcium 7.9 (L) 8.9 - 10.3 mg/dL   Total Protein 6.2 (L) 6.5 - 8.1 g/dL   Albumin 2.4 (L) 3.5 - 5.0 g/dL   AST 31 15 - 41 U/L   ALT 20 17 - 63 U/L   Alkaline Phosphatase 103 38 - 126 U/L   Total  Bilirubin 0.5 0.3 - 1.2 mg/dL   GFR calc non Af Amer >60 >60 mL/min   GFR calc Af Amer >60 >60 mL/min    Comment: (NOTE) The eGFR has been calculated using the CKD EPI equation. This calculation has not been validated in all clinical situations. eGFR's persistently <60 mL/min signify possible Chronic Kidney Disease.    Anion gap 7 5 - 15  CBC     Status: Abnormal   Collection Time: 09/25/15  2:50 AM  Result Value Ref Range   WBC 4.6 4.0 - 10.5 K/uL   RBC 4.60 4.22 - 5.81 MIL/uL   Hemoglobin 11.5 (L) 13.0 - 17.0 g/dL   HCT 36.2 (L) 39.0 - 52.0 %   MCV 78.7 78.0 - 100.0 fL   MCH 25.0 (L) 26.0 - 34.0 pg   MCHC 31.8 30.0 - 36.0 g/dL   RDW 13.3 11.5 - 15.5 %   Platelets 121 (L) 150 - 400 K/uL  Troponin I     Status: None   Collection Time: 09/25/15  2:50 AM  Result Value Ref Range   Troponin I <0.03 <0.031 ng/mL    Comment:        NO INDICATION OF MYOCARDIAL INJURY.   Type and screen [place]     Status: None   Collection Time: 09/25/15  2:50 AM  Result Value Ref Range   ABO/RH(D) A POS    Antibody Screen NEG    Sample Expiration 09/28/2015   ABO/Rh     Status: None   Collection Time: 09/25/15  2:50 AM  Result Value Ref Range   ABO/RH(D) A POS   Glucose, capillary     Status: Abnormal   Collection Time: 09/25/15  4:51 AM  Result Value Ref Range   Glucose-Capillary 218 (H) 65 - 99 mg/dL  Glucose, capillary     Status: Abnormal   Collection Time: 09/25/15  7:32 AM  Result Value Ref Range   Glucose-Capillary 204 (H) 65 - 99 mg/dL    Dg Chest 2 View  09/24/2015  CLINICAL DATA:  Fever starting yesterday, metastatic carcinoma EXAM: CHEST  2 VIEW COMPARISON:  5/17/ 16 FINDINGS: Cardiomediastinal silhouette is stable. No acute infiltrate or pulmonary edema. There is dextroscoliosis of thoracic spine. IMPRESSION: No active cardiopulmonary disease.  Dextroscoliosis thoracic spine. Electronically Signed   By: Lahoma Crocker M.D.   On: 09/24/2015 13:46    ROS: please see present illness            Blood pressure 121/81, pulse 74, temperature 99.1 F (37.3 C), temperature source Oral, resp. rate 18, height '6\' 1"'  (1.854 m), weight 67.6 kg (149 lb 0.5 oz), SpO2 98 %.  Physical exam:   General-- pleasant  white male in no acute distress ENT-- nonicteric Neck-- no gross lymphadenopathy Heart-- regular rate and rhythm without murmurs or gallops Lungs-- clear Abdomen-- soft and nontender with normal bowel sounds and no palpable masses Psych-- alert and oriented times 3   Assessment: 1. Rectal bleeding. This is bright red blood with wiping and probably is due to hemorrhoids. It is of new onset in view of this I think he should have colonoscopy/sigmoidoscopy. He had incomplete procedure last year. Due to his underlying genetic defect he could be at risk for the development of colonic neoplasia. Have discussed all this with him. 2. Von-Hippel Lindeau Syndrome. This is resulted in multiple primary cancers as noted above and multiple prior surgeries 3. Status post total pancreatectomy with pancreatic insufficiency and secondary diabetes 4. 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 tract causing this rectal bleeding.   Myleen Brailsford JR,Reighlyn Elmes L 09/25/2015, 8:58 AM   Pager: [phone] If no answer or after hours call [phone]

## 2015-09-27 DIAGNOSIS — I951 Orthostatic hypotension: Secondary | ICD-10-CM

## 2015-09-27 LAB — CBC
HEMATOCRIT: 35.4 % — AB (ref 39.0–52.0)
HEMOGLOBIN: 11.2 g/dL — AB (ref 13.0–17.0)
MCH: 24.7 pg — AB (ref 26.0–34.0)
MCHC: 31.6 g/dL (ref 30.0–36.0)
MCV: 78.1 fL (ref 78.0–100.0)
PLATELETS: 176 10*3/uL (ref 150–400)
RBC: 4.53 MIL/uL (ref 4.22–5.81)
RDW: 13.3 % (ref 11.5–15.5)
WBC: 5 10*3/uL (ref 4.0–10.5)

## 2015-09-27 LAB — BASIC METABOLIC PANEL
ANION GAP: 7 (ref 5–15)
BUN: 10 mg/dL (ref 6–20)
CALCIUM: 8.3 mg/dL — AB (ref 8.9–10.3)
CO2: 29 mmol/L (ref 22–32)
CREATININE: 0.75 mg/dL (ref 0.61–1.24)
Chloride: 102 mmol/L (ref 101–111)
GFR calc non Af Amer: >60 mL/min (ref >60)
Glucose, Bld: 144 mg/dL — ABNORMAL HIGH (ref 65–99)
Potassium: 3.7 mmol/L (ref 3.5–5.1)
Sodium: 138 mmol/L (ref 135–145)

## 2015-09-27 LAB — GLUCOSE, CAPILLARY
GLUCOSE-CAPILLARY: 140 mg/dL — AB (ref 65–99)
GLUCOSE-CAPILLARY: 214 mg/dL — AB (ref 65–99)
Glucose-Capillary: 119 mg/dL — ABNORMAL HIGH (ref 65–99)

## 2015-09-27 LAB — CORTISOL: Cortisol, Plasma: 10.3 ug/dL

## 2015-09-27 LAB — OCCULT BLOOD X 1 CARD TO LAB, STOOL: Fecal Occult Bld: NEGATIVE

## 2015-09-27 MED ORDER — CEPHALEXIN 500 MG PO CAPS: 500.0000 mg | ORAL_CAPSULE | Freq: Two times a day (BID) | ORAL | Status: DC

## 2015-09-27 NOTE — Progress Notes (Signed)
[name] looks pretty good this morning. His colonoscopy I think was pretty much unremarkable. There have been some hemorrhoids. No obvious masses noted.  His echocardiogram looks good. He has a good ejection fraction. There is no obvious valvular abnormality.  He's been afebrile. So far, cultures have been negative.  His blood work looks pretty good today. His blood sugars 12/06/1940. His wife 5. Hemoglobin 11.2.  He feels okay. He's eating. He's not having any nausea or vomiting.  His blood pressure has been on the high side. He sometimes has these episodes of hypertension. When that happens, I think he takes Cardura 2 bradyed down.  On his physical exam, everything looks pretty stable compared to yesterday. His lungs are clear. Cardiac exam regular rate and rhythm. Abdomen is soft. Bowel sounds present. He has a suprapubic catheter.  Hopefully, he'll be oh to go home today or tomorrow. From my point of view, it looks like he is back to his baseline.  He can continue his Afinitor once he gets home.  Appreciate all the great care that he is gotten. I'm just thankful that nothing significant or serious has been found.  [place] 55:22

## 2015-09-27 NOTE — Discharge Summary (Signed)
Discharge Summary  [name]. KGY:[id number] DOB: [birth year]  PCP: Volanda Napoleon, MD  Admit date: 09/24/2015 Discharge date: 09/27/2015  Time spent: <7mins  Recommendations for Outpatient Follow-up:  1. F/u with PMD and oncology   Discharge Diagnoses:  Active Hospital Problems   Diagnosis Date Noted  . Malnutrition of moderate degree 09/25/2015  . Hypotension 09/24/2015  . Sepsis ([place]) 09/24/2015  . Blood in stool 09/24/2015  . UTI (lower urinary tract infection) 08/19/2014  . 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 Dehydration 08/19/2014  . Neuroendocrine carcinoma ([place]) 01/10/2012    Resolved Hospital Problems   Diagnosis Date Noted Date Resolved  No resolved problems to display.    Discharge Condition: stable  Diet recommendation: heart healthy/carb modified  Filed Weights   09/24/15 2019 09/26/15 0947  Weight: 149 lb 0.5 oz (67.6 kg) 149 lb (67.586 kg)    History of present illness: (per Dr. Roel Cluck) [name]. is a 54 y.o. 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   Presented with 2 day history of watery diarrhea which has now improved. At his baseline patient is status post pancreatectomy and therefore he has some loose stools but never watery this was a change for him. Patient endorses myalgias. He reports some chills as well as fever at home up to 103.8. Wife reports that she has to digitally stimulate him in order to produce a bowel movement today she has noticed there was some bleeding with stool she thinks it was at least a quarter cup  of red mixed stool. Patient reports occasional sharp rectal pains but no abdominal pain. Wife states because of prior history UTI she fought that's probably what was causing this particular episode and started him on Levaquin. Patient's wife called his primary oncology who recommended given fever to have evaluation in emergency department. He was found to be febrile up to 101 while in ER. UA was worrisome for UTI his lactic acid rhythm remained within normal limits. At first since patient was doing better and diarrhea has resolved so the plan was for him to be discharged home on Levaquin by mouth. Unfortunately when patient attempted to get home he became hypotensive and had to be brought back to emergency department his blood pressure initially was down to 70s over 30. Patient was given normal saline bolus 1 L blood pressure improved and the latest being up to 128/79. At this point decision was made for patient to stay overnight for father rehydration and monitoring.   Patient known history of metastatic neuroendocrine carcinoma of the pancreas currently on Afinitor. As well as Von Hipple Lindau Syndrome. 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 History of renal cell carcinoma as well as retinal angiomas. Examination has undergone in the past treatment at [facility].  Patient has history of recurrent urinary tract infections in the past treated well with Levaquin. He has had recurrent episodes of hypotension for to be most likely secondary to orthostatic hypotension. Patient has been on Midodrin.  Patient does have history of diabetes for which she is taking Lantus. He reports good control.    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  Much improved on ivf and abx. Culture unrevealing.  Resolved. afinitor held in the hospital, resumed at discharge.  UTI with suprapubic catheter (placed a week ago) and condom catheter. Has chronic bladder irritation due to catheter, reported he noticed urine odor which was new. Treated with rocephin. Urine clear in bag. Urine culture no significant growth, much improved, discharge with keflex for 43more days to finish 7day abx course.  BRBPR: hgb stable, s/p sigmoidoscopy, no tumor, +internal hemorrhoids, appreciate GI input.  IDDM2, a1c 8.6, continue insulin, adjust prn.  Chronic orthostatic hypotension; continue midodrine. , patient reported he has episodic hypertension for which he takes cardura prn.  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 Resumed at discharge, appreciate oncology input.   Code Status: DNR  Family Communication: patient   Disposition Plan: home 10/22   Consultants:  GI  oncology  Procedures:  Sigmoidoscopy 10/21  Antibiotics:  rocephin   Discharge Exam: BP 138/92 mmHg  Pulse 71  Temp(Src) 98.2 F (36.8 C) (Oral)  Resp 20  Ht 6\' 1"  (1.854 m)  Wt 149 lb (67.586 kg)  BMI 19.66 kg/m2  SpO2 98%   General: NAD  Cardiovascular: RRR  Respiratory: CTABL  Abdomen: Soft/ND/NT, positive BS, suprapubic catheter with clear urine  Musculoskeletal: No Edema  Neuro: chronic changes due to neuroendocrine tumor, paraplegia    Discharge Instructions You were cared for by a hospitalist during your hospital stay. If you have any questions about your discharge medications or the care you received while you were in the hospital after you are discharged, you can call the unit and asked to speak with the hospitalist on call if the hospitalist that took care of you is not available. Once you  are discharged, your primary care physician will handle any further medical issues. Please note that NO REFILLS for any discharge medications will be authorized once you are discharged, as it is imperative that you return to your primary care physician (or establish a relationship with a primary care physician if you do not have one) for your aftercare needs so that they can reassess your need for medications and monitor your lab values.      Discharge Instructions    Diet - low sodium heart healthy    Complete by:  As directed   Carb modified     Increase activity slowly    Complete by:  As directed             Medication List    TAKE these medications        baclofen 20 MG tablet  Commonly known as:  LIORESAL  Take 20-40 mg by mouth 4 (four) times daily.     cephALEXin 500 MG capsule  Commonly known as:  KEFLEX  Take 1 capsule (500 mg total) by mouth 2 (two) times daily.     diazepam 5 MG tablet  Commonly known as:  VALIUM  TAKE 1 TABLET BY MOUTH 3 TIMES DAILY AS NEEDED FOR MUSCLE SPASMS/ANXIETY     doxazosin 1 MG tablet  Commonly known as:  CARDURA  Take 1 tablet (1 mg total) by mouth 2 (two) times daily.  dronabinol 5 MG capsule  Commonly known as:  MARINOL  TAKE 1 CAPSULE BY MOUTH TWICE DAILY WITH MEALS     everolimus 10 MG tablet  Commonly known as:  AFINITOR  Take 1 tablet (10 mg total) by mouth daily.     HYDROcodone-acetaminophen 5-325 MG tablet  Commonly known as:  NORCO/VICODIN  Take 1 tablet by mouth every 6 (six) hours as needed for moderate pain.     HYDROmorphone 4 MG tablet  Commonly known as:  DILAUDID  Take 4 mg by mouth every 6 (six) hours as needed for severe pain.     insulin glargine 100 UNIT/ML injection  Commonly known as:  LANTUS  Inject 31 Units into the skin daily before breakfast.     insulin lispro 100 UNIT/ML injection  Commonly known as:  HUMALOG  Inject 3-9 Units into the skin 3 (three) times daily before meals. SS      levofloxacin 500 MG tablet  Commonly known as:  LEVAQUIN  Take 1 tablet by mouth daily as needed. Fever, uti     midodrine 5 MG tablet  Commonly known as:  PROAMATINE  TKAE 1/2 TABLET BY MOUTH TWICE A DAY AS NEEDED     montelukast 10 MG tablet  Commonly known as:  SINGULAIR  Take 1 tablet (10 mg total) by mouth at bedtime.     Pancrelipase (Lip-Prot-Amyl) 24000 UNITS Cpep  Take 3 capsules by mouth 3 (three) times daily with meals.     promethazine 12.5 MG tablet  Commonly known as:  PHENERGAN  Take 12.5 mg by mouth every 6 (six) hours as needed for nausea.     tiZANidine 4 MG tablet  Commonly known as:  ZANAFLEX  TAKE 2 TABLETS BY MOUTH EVERY 6 HOURS AS NEEDED FOR PAIN     traMADol 50 MG tablet  Commonly known as:  ULTRAM  TAKE 1 TABLET BY MOUTH 3 TIMES DAILY       Allergies  Allergen Reactions  . Ciprocin-Fluocin-Procin [Fluocinolone Acetonide] Rash    Pt states this happened with IV Cipro. ABLE TO TAKE PO CIPRO.  . Other Rash    IV-CIPRO   Follow-up Information    Follow up with Volanda Napoleon, MD In 2 weeks.   Specialty:  Oncology   Why:  hospital discharge follow up   Contact information:   [address], [address] [place] [address] [phone]        The results of significant diagnostics from this hospitalization (including imaging, microbiology, ancillary and laboratory) are listed below for reference.    Significant Diagnostic Studies: Dg Chest 2 View  09/24/2015  CLINICAL DATA:  Fever starting yesterday, metastatic carcinoma EXAM: CHEST  2 VIEW COMPARISON:  5/17/ 16 FINDINGS: Cardiomediastinal silhouette is stable. No acute infiltrate or pulmonary edema. There is dextroscoliosis of thoracic spine. IMPRESSION: No active cardiopulmonary disease.  Dextroscoliosis thoracic spine. Electronically Signed   By: Lahoma Crocker M.D.   On: 09/24/2015 13:46    Microbiology: Recent Results (from the past 240 hour(s))  Culture, blood (routine x 2)      Status: None (Preliminary result)   Collection Time: 09/24/15  1:09 PM  Result Value Ref Range Status   Specimen Description LEFT ANTECUBITAL  Final   Special Requests BOTTLES DRAWN AEROBIC AND ANAEROBIC 5CC  Final   Culture   Final    NO GROWTH < 24 HOURS Performed at [facility]    Report Status PENDING  Incomplete  Culture,  blood (routine x 2)     Status: None (Preliminary result)   Collection Time: 09/24/15  1:35 PM  Result Value Ref Range Status   Specimen Description BLOOD BLOOD LEFT WRIST  Final   Special Requests BOTTLES DRAWN AEROBIC AND ANAEROBIC 5CC  Final   Culture   Final    NO GROWTH < 24 HOURS Performed at [facility]    Report Status PENDING  Incomplete  Urine culture     Status: None   Collection Time: 09/24/15  1:40 PM  Result Value Ref Range Status   Specimen Description URINE, CATHETERIZED  Final   Special Requests NONE  Final   Culture   Final    MULTIPLE SPECIES PRESENT, SUGGEST RECOLLECTION Performed at [facility]    Report Status 09/25/2015 FINAL  Final     Labs: Basic Metabolic Panel:  Recent Labs Lab 09/24/15 1259 09/25/15 0250 09/26/15 0220 09/27/15 0518  NA 137 138 142 138  K 3.5 3.5 3.3* 3.7  CL 101 104 104 102  CO2 27 27 29 29   GLUCOSE 148* 256* 120* 144*  BUN 16 13 10 10   CREATININE 0.93 0.90 0.72 0.75  CALCIUM 8.2* 7.9* 8.2* 8.3*  MG  --  2.2 2.1  --   PHOS  --  2.1*  --   --    Liver Function Tests:  Recent Labs Lab 09/24/15 1259 09/25/15 0250  AST 29 31  ALT 19 20  ALKPHOS 112 103  BILITOT 1.2 0.5  PROT 6.6 6.2*  ALBUMIN 2.8* 2.4*   No results for input(s): LIPASE, AMYLASE in the last 168 hours. No results for input(s): AMMONIA in the last 168 hours. CBC:  Recent Labs Lab 09/24/15 1259 09/24/15 2055 09/25/15 0250 09/25/15 1235 09/26/15 0220 09/27/15 0518  WBC 7.5 4.7 4.6 4.6 5.6 5.0  NEUTROABS 6.1  --   --   --   --   --   HGB 12.6* 11.4* 11.5* 11.5* 11.1* 11.2*  HCT 39.7 36.0*  36.2* 36.8* 35.2* 35.4*  MCV 78.0 78.9 78.7 79.3 78.2 78.1  PLT 129* 115* 121* 123* 152 176   Cardiac Enzymes:  Recent Labs Lab 09/25/15 0250 09/25/15 0850 09/26/15 0220 09/26/15 1409 09/26/15 2031  TROPONINI <0.03 <0.03 <0.03 <0.03 <0.03   BNP: BNP (last 3 results) No results for input(s): BNP in the last 8760 hours.  ProBNP (last 3 results) No results for input(s): PROBNP in the last 8760 hours.  CBG:  Recent Labs Lab 09/26/15 1614 09/26/15 1959 09/27/15 0005 09/27/15 0437 09/27/15 0752  GLUCAP 253* 280* 214* 140* 119*       Signed:  Ricky Doan MD, PhD  [facility] 09/27/2015, 9:03 AM

## 2015-09-28 LAB — URINE CULTURE: Culture: 40000

## 2015-09-29 ENCOUNTER — Encounter (HOSPITAL_COMMUNITY): Payer: Self-pay | Admitting: Gastroenterology

## 2015-09-29 LAB — CULTURE, BLOOD (ROUTINE X 2)
Culture: NO GROWTH
Culture: NO GROWTH

## 2015-10-08 ENCOUNTER — Other Ambulatory Visit (HOSPITAL_BASED_OUTPATIENT_CLINIC_OR_DEPARTMENT_OTHER): Payer: 59

## 2015-10-08 ENCOUNTER — Ambulatory Visit (HOSPITAL_BASED_OUTPATIENT_CLINIC_OR_DEPARTMENT_OTHER): Payer: 59

## 2015-10-08 ENCOUNTER — Encounter: Payer: Self-pay | Admitting: Hematology & Oncology

## 2015-10-08 ENCOUNTER — Ambulatory Visit (HOSPITAL_BASED_OUTPATIENT_CLINIC_OR_DEPARTMENT_OTHER): Payer: 59 | Admitting: Hematology & Oncology

## 2015-10-08 VITALS — BP 148/97 | HR 79 | Temp 98.2°F | Resp 18

## 2015-10-08 VITALS — BP 116/70 | HR 100 | Temp 94.5°F

## 2015-10-08 DIAGNOSIS — E139 Other specified diabetes mellitus without complications: Secondary | ICD-10-CM

## 2015-10-08 DIAGNOSIS — Z23 Encounter for immunization: Secondary | ICD-10-CM

## 2015-10-08 DIAGNOSIS — D509 Iron deficiency anemia, unspecified: Secondary | ICD-10-CM | POA: Diagnosis not present

## 2015-10-08 DIAGNOSIS — C7B8 Other secondary neuroendocrine tumors: Secondary | ICD-10-CM

## 2015-10-08 DIAGNOSIS — Z90410 Acquired total absence of pancreas: Secondary | ICD-10-CM

## 2015-10-08 DIAGNOSIS — N319 Neuromuscular dysfunction of bladder, unspecified: Secondary | ICD-10-CM

## 2015-10-08 DIAGNOSIS — E891 Postprocedural hypoinsulinemia: Secondary | ICD-10-CM

## 2015-10-08 DIAGNOSIS — C7A8 Other malignant neuroendocrine tumors: Secondary | ICD-10-CM | POA: Diagnosis not present

## 2015-10-08 DIAGNOSIS — Q858 Other phakomatoses, not elsewhere classified: Secondary | ICD-10-CM

## 2015-10-08 LAB — COMPREHENSIVE METABOLIC PANEL (CC13)
ALBUMIN: 2.8 g/dL — AB (ref 3.5–5.0)
ALK PHOS: 121 U/L (ref 40–150)
ALT: 20 U/L (ref 0–55)
AST: 32 U/L (ref 5–34)
Anion Gap: 8 mEq/L (ref 3–11)
BUN: 14.7 mg/dL (ref 7.0–26.0)
CALCIUM: 9.2 mg/dL (ref 8.4–10.4)
CO2: 27 mEq/L (ref 22–29)
Chloride: 107 mEq/L (ref 98–109)
Creatinine: 0.8 mg/dL (ref 0.7–1.3)
Glucose: 77 mg/dl (ref 70–140)
POTASSIUM: 3.3 meq/L — AB (ref 3.5–5.1)
SODIUM: 143 meq/L (ref 136–145)
Total Bilirubin: 0.41 mg/dL (ref 0.20–1.20)
Total Protein: 6.8 g/dL (ref 6.4–8.3)

## 2015-10-08 LAB — CBC WITH DIFFERENTIAL (CANCER CENTER ONLY)
BASO#: 0 10*3/uL (ref 0.0–0.2)
BASO%: 0.3 % (ref 0.0–2.0)
EOS ABS: 0.2 10*3/uL (ref 0.0–0.5)
EOS%: 2 % (ref 0.0–7.0)
HEMATOCRIT: 39.5 % (ref 38.7–49.9)
HEMOGLOBIN: 12.5 g/dL — AB (ref 13.0–17.1)
LYMPH#: 1.1 10*3/uL (ref 0.9–3.3)
LYMPH%: 13.1 % — ABNORMAL LOW (ref 14.0–48.0)
MCH: 24.5 pg — AB (ref 28.0–33.4)
MCHC: 31.6 g/dL — AB (ref 32.0–35.9)
MCV: 77 fL — ABNORMAL LOW (ref 82–98)
MONO#: 0.4 10*3/uL (ref 0.1–0.9)
MONO%: 4.9 % (ref 0.0–13.0)
NEUT%: 79.7 % (ref 40.0–80.0)
NEUTROS ABS: 6.9 10*3/uL — AB (ref 1.5–6.5)
Platelets: 331 10*3/uL (ref 145–400)
RBC: 5.11 10*6/uL (ref 4.20–5.70)
RDW: 14.2 % (ref 11.1–15.7)
WBC: 8.6 10*3/uL (ref 4.0–10.0)

## 2015-10-08 MED ORDER — SODIUM CHLORIDE 0.9 % IV SOLN
INTRAVENOUS | Status: DC
  Administered 2015-10-08: 12:00:00 via INTRAVENOUS

## 2015-10-08 MED ORDER — SODIUM CHLORIDE 0.9 % IV SOLN
510.0000 mg | Freq: Once | INTRAVENOUS | Status: AC
  Administered 2015-10-08: 510 mg via INTRAVENOUS
  Filled 2015-10-08: qty 17

## 2015-10-08 MED ORDER — LANREOTIDE ACETATE 120 MG/0.5ML ~~LOC~~ SOLN
120.0000 mg | Freq: Once | SUBCUTANEOUS | Status: AC
Start: 2015-10-08 — End: 2015-10-08
  Administered 2015-10-08: 120 mg via SUBCUTANEOUS
  Filled 2015-10-08: qty 120

## 2015-10-08 NOTE — Progress Notes (Signed)
Hematology and Oncology Follow Up Visit  [name] [id number] 22-Mar-1961 54 y.o. 10/08/2015   Principle Diagnosis:   Metastatic neuroendocrine carcinoma  Von Hipple-Lindau Syndrome  Current Therapy:  Afinitor 10 mg by mouth daily  Somatuline 120mg  SQ q month     Interim History:  Mr.  [name] is back for followup.he was recently hospitalized. He had some type of sepsis-like syndrome. He typically gets urinary tract infections. He may have had another Escherichia coli infection.  The big news is that he is going up to [place] in early December. They did a look at his  MRIs that he had done of the brain down here. They think that he might need to be resected with some of his lesions. I certainly understand this and they feel they can do this I have no problems with it.  He is on Afinitor. He is doing well with this. He's not having any issues with mouth sores.  His blood pressure is actually on the low side when he was admitted to the hospital. He did have one episode of hypertensive crisis.  His blood sugars have been doing fairly well. That's evident on the lower side. His wife manages his insulin. She will readjust his insulin dosing.  He's had no bleeding.  He's had no diarrhea.  As always, his wife is doing a great job taking care of him. She is truly dedicated to him and I think it iss wonderful that she has been able to be such a strength.  Overall, his performance status is ECOG 1.   Medications:  Current outpatient prescriptions:  .  baclofen (LIORESAL) 20 MG tablet, Take 20-40 mg by mouth 4 (four) times daily. , Disp: , Rfl:  .  diazepam (VALIUM) 5 MG tablet, TAKE 1 TABLET BY MOUTH 3 TIMES DAILY AS NEEDED FOR MUSCLE SPASMS/ANXIETY, Disp: 40 tablet, Rfl: 1 .  doxazosin (CARDURA) 1 MG tablet, Take 1 tablet (1 mg total) by mouth 2 (two) times daily. (Patient taking differently: Take 1 mg by mouth 2 (two) times daily as needed (blood pressure spikes.). ), Disp: 60  tablet, Rfl: 2 .  dronabinol (MARINOL) 5 MG capsule, TAKE 1 CAPSULE BY MOUTH TWICE DAILY WITH MEALS, Disp: 60 capsule, Rfl: 1 .  everolimus (AFINITOR) 10 MG tablet, Take 1 tablet (10 mg total) by mouth daily., Disp: 30 tablet, Rfl: 2 .  HYDROcodone-acetaminophen (NORCO/VICODIN) 5-325 MG tablet, Take 1 tablet by mouth every 6 (six) hours as needed for moderate pain., Disp: 90 tablet, Rfl: 0 .  HYDROmorphone (DILAUDID) 4 MG tablet, Take 4 mg by mouth every 6 (six) hours as needed for severe pain., Disp: , Rfl:  .  insulin glargine (LANTUS) 100 UNIT/ML injection, Inject 31 Units into the skin daily before breakfast. , Disp: , Rfl:  .  insulin lispro (HUMALOG) 100 UNIT/ML injection, Inject 3-9 Units into the skin 3 (three) times daily before meals. SS, Disp: , Rfl:  .  midodrine (PROAMATINE) 5 MG tablet, TKAE 1/2 TABLET BY MOUTH TWICE A DAY AS NEEDED (Patient taking differently: TKAE 1/2 TABLET BY MOUTH TWICE A DAY AS NEEDED BLOOD PRESSURE.), Disp: 60 tablet, Rfl: 6 .  Pancrelipase, Lip-Prot-Amyl, 24000 UNITS CPEP, Take 3 capsules by mouth 3 (three) times daily with meals., Disp: , Rfl:  .  promethazine (PHENERGAN) 12.5 MG tablet, Take 12.5 mg by mouth every 6 (six) hours as needed for nausea. , Disp: , Rfl:  .  tiZANidine (ZANAFLEX) 4 MG tablet, TAKE 2  TABLETS BY MOUTH EVERY 6 HOURS AS NEEDED FOR PAIN, Disp: 30 tablet, Rfl: 3 .  traMADol (ULTRAM) 50 MG tablet, TAKE 1 TABLET BY MOUTH 3 TIMES DAILY, Disp: 90 tablet, Rfl: 2 No current facility-administered medications for this visit.  Facility-Administered Medications Ordered in Other Visits:  .  0.9 %  sodium chloride infusion, , Intravenous, Continuous, Volanda Napoleon, MD, Stopped at 10/08/15 1333  Allergies:  Allergies  Allergen Reactions  . Ciprocin-Fluocin-Procin [Fluocinolone Acetonide] Rash    Pt states this happened with IV Cipro. ABLE TO TAKE PO CIPRO.  . Other Rash    IV-CIPRO    Past Medical History, Surgical history, Social history,  and Family History were reviewed and updated.  Review of Systems: As above  Physical Exam:  oral temperature is 94.5 F (34.7 C). His blood pressure is 116/70 and his pulse is 100.   Well-developed and well-nourished gentleman. He is paralyzed from the waist down. His head exam shows no ocular or oral lesions. He has no palpable cervical or supraclavicular lymph nodes. His lungs are clear. Cardiac exam regular rate and rhythm with no murmurs, rubs or bruits. Abdomen is soft. Has good bowel sounds. There is no fluid wave. He has a well-healed laparotomy scar is. He has no obvious abdominal mass. There is no obvious liver or spleen tip. Back exam shows a laminectomy scar in the neck and upper thoracic spine. Extremities shows no swelling in his lower legs. He has no strength secondary to the paralysis. Neurological exam is nonfocal. Skin exam shows no rashes.  Lab Results  Component Value Date   WBC 8.6 10/08/2015   HGB 12.5* 10/08/2015   HCT 39.5 10/08/2015   MCV 77* 10/08/2015   PLT 331 10/08/2015     Chemistry      Component Value Date/Time   NA 143 10/08/2015 1057   NA 138 09/27/2015 0518   NA 136 07/22/2015 0937   K 3.3* 10/08/2015 1057   K 3.7 09/27/2015 0518   K 4.2 07/22/2015 0937   CL 102 09/27/2015 0518   CL 96* 07/22/2015 0937   CO2 27 10/08/2015 1057   CO2 29 09/27/2015 0518   CO2 28 07/22/2015 0937   BUN 14.7 10/08/2015 1057   BUN 10 09/27/2015 0518   BUN 20 07/22/2015 0937   CREATININE 0.8 10/08/2015 1057   CREATININE 0.75 09/27/2015 0518   CREATININE 0.9 07/22/2015 0937      Component Value Date/Time   CALCIUM 9.2 10/08/2015 1057   CALCIUM 8.3* 09/27/2015 0518   CALCIUM 9.4 07/22/2015 0937   ALKPHOS 121 10/08/2015 1057   ALKPHOS 103 09/25/2015 0250   ALKPHOS 150* 07/22/2015 0937   AST 32 10/08/2015 1057   AST 31 09/25/2015 0250   AST 45* 07/22/2015 0937   ALT 20 10/08/2015 1057   ALT 20 09/25/2015 0250   ALT 27 07/22/2015 0937   BILITOT 0.41  10/08/2015 1057   BILITOT 0.5 09/25/2015 0250   BILITOT 0.90 07/22/2015 0937         Impression and Plan: [name] is 54 year old gentleman with von Hippel-Lindau syndrome. He has multiple neuroendocrine tumors. 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 He's had multiple spinal surgeries.  He is on Afinitor. He is doing well with the Afinitor. We will go ahead and give him set with a set of CT scans next month.  He goes up to [place] in early December and a possibly will try going to help  resect out some of the CNS lesions.  We still will continue him on Somatuline.  I will see him back in one month.  Volanda Napoleon, MD 11/2/20162:30 PM

## 2015-10-08 NOTE — Patient Instructions (Addendum)
Lanreotide injection What is this medicine? LANREOTIDE (lan REE oh tide) is used to reduce blood levels of growth hormone in patients with a condition called acromegaly. It also works to slow or stop tumor growth in patients with gastroenteropancreatic neuroendocrine tumor (GEP-NET). This medicine may be used for other purposes; ask your health care provider or pharmacist if you have questions. What should I tell my health care provider before I take this medicine? They need to know if you have any of these conditions: -diabetes -gallbladder disease -heart disease -kidney disease -liver disease -an unusual or allergic reaction to lanreotide, other medicines, latex, foods, dyes, or preservatives -pregnant or trying to get pregnant -breast-feeding How should I use this medicine? This medicine is for injection under the skin. It is given by a health care professional in a hospital or clinic setting. Contact your pediatrician or health care professional regarding the use of this medicine in children. Special care may be needed. Overdosage: If you think you have taken too much of this medicine contact a poison control center or emergency room at once. NOTE: This medicine is only for you. Do not share this medicine with others. What if I miss a dose? It is important not to miss your dose. Call your doctor or health care professional if you are unable to keep an appointment. What may interact with this medicine? -bromocriptine -cyclosporine -medicines for diabetes, including insulin -medicines for heart disease or hypertension -quinidine This list may not describe all possible interactions. Give your health care provider a list of all the medicines, herbs, non-prescription drugs, or dietary supplements you use. Also tell them if you smoke, drink alcohol, or use illegal drugs. Some items may interact with your medicine. What should I watch for while using this medicine? Visit your doctor or  health care professional for regular checks on your progress. Your condition will be monitored carefully while you are receiving this medicine. This medicine may cause increases or decreases in blood sugar. Signs of high blood sugar include frequent urination, unusual thirst, flushed or dry skin, difficulty breathing, drowsiness, stomach ache, nausea, vomiting or dry mouth. Signs of low blood sugar include chills, cool, pale skin or cold sweats, drowsiness, extreme hunger, fast heartbeat, headache, nausea, nervousness or anxiety, shakiness, trembling, unsteadiness, tiredness, or weakness. Contact your doctor or health care professional right away if you experience any of these symptoms. What side effects may I notice from receiving this medicine? Side effects that you should report to your doctor or health care professional as soon as possible: -allergic reactions like skin rash, itching or hives, swelling of the face, lips, or tongue -changes in blood sugar -changes in heart rate -severe stomach pain Side effects that usually do not require medical attention (report to your doctor or health care professional if they continue or are bothersome): -diarrhea or constipation -gas or stomach pain -nausea, vomiting -pain, redness, swelling and irritation at site where injected This list may not describe all possible side effects. Call your doctor for medical advice about side effects. You may report side effects to FDA at 1-800-FDA-1088. Where should I keep my medicine? This drug is given in a hospital or clinic and will not be stored at home. NOTE: This sheet is a summary. It may not cover all possible information. If you have questions about this medicine, talk to your doctor, pharmacist, or health care provider.    2016, [employer] Standard. (2013-11-21 17:43:04)   Ferumoxytol injection What is this medicine? FERUMOXYTOL is an  iron complex. Iron is used to make healthy red blood cells, which  carry oxygen and nutrients throughout the body. This medicine is used to treat iron deficiency anemia in people with chronic kidney disease. This medicine may be used for other purposes; ask your health care provider or pharmacist if you have questions. What should I tell my health care provider before I take this medicine? They need to know if you have any of these conditions: -anemia not caused by low iron levels -high levels of iron in the blood -magnetic resonance imaging (MRI) test scheduled -an unusual or allergic reaction to iron, other medicines, foods, dyes, or preservatives -pregnant or trying to get pregnant -breast-feeding How should I use this medicine? This medicine is for injection into a vein. It is given by a health care professional in a hospital or clinic setting. Talk to your pediatrician regarding the use of this medicine in children. Special care may be needed. Overdosage: If you think you have taken too much of this medicine contact a poison control center or emergency room at once. NOTE: This medicine is only for you. Do not share this medicine with others. What if I miss a dose? It is important not to miss your dose. Call your doctor or health care professional if you are unable to keep an appointment. What may interact with this medicine? This medicine may interact with the following medications: -other iron products This list may not describe all possible interactions. Give your health care provider a list of all the medicines, herbs, non-prescription drugs, or dietary supplements you use. Also tell them if you smoke, drink alcohol, or use illegal drugs. Some items may interact with your medicine. What should I watch for while using this medicine? Visit your doctor or healthcare professional regularly. Tell your doctor or healthcare professional if your symptoms do not start to get better or if they get worse. You may need blood work done while you are taking this  medicine. You may need to follow a special diet. Talk to your doctor. Foods that contain iron include: whole grains/cereals, dried fruits, beans, or peas, leafy green vegetables, and organ meats (liver, kidney). What side effects may I notice from receiving this medicine? Side effects that you should report to your doctor or health care professional as soon as possible: -allergic reactions like skin rash, itching or hives, swelling of the face, lips, or tongue -breathing problems -changes in blood pressure -feeling faint or lightheaded, falls -fever or chills -flushing, sweating, or hot feelings -swelling of the ankles or feet Side effects that usually do not require medical attention (Report these to your doctor or health care professional if they continue or are bothersome.): -diarrhea -headache -nausea, vomiting -stomach pain This list may not describe all possible side effects. Call your doctor for medical advice about side effects. You may report side effects to FDA at 1-800-FDA-1088. Where should I keep my medicine? This drug is given in a hospital or clinic and will not be stored at home. NOTE: This sheet is a summary. It may not cover all possible information. If you have questions about this medicine, talk to your doctor, pharmacist, or health care provider.    2016, [employer]vier/Gold Standard. (2012-07-07 15:23:36)

## 2015-10-14 LAB — CHROMOGRANIN A: CHROMOGRANIN A: 21 ng/mL — AB (ref <=15)

## 2015-10-17 ENCOUNTER — Other Ambulatory Visit: Payer: Self-pay | Admitting: Hematology & Oncology

## 2015-10-20 ENCOUNTER — Encounter: Payer: Self-pay | Admitting: Hematology & Oncology

## 2015-10-20 ENCOUNTER — Telehealth: Payer: Self-pay | Admitting: *Deleted

## 2015-10-20 ENCOUNTER — Ambulatory Visit (HOSPITAL_BASED_OUTPATIENT_CLINIC_OR_DEPARTMENT_OTHER): Payer: 59

## 2015-10-20 ENCOUNTER — Other Ambulatory Visit: Payer: Self-pay | Admitting: Hematology & Oncology

## 2015-10-20 ENCOUNTER — Other Ambulatory Visit: Payer: Self-pay | Admitting: *Deleted

## 2015-10-20 DIAGNOSIS — N319 Neuromuscular dysfunction of bladder, unspecified: Secondary | ICD-10-CM

## 2015-10-20 DIAGNOSIS — C7A8 Other malignant neuroendocrine tumors: Secondary | ICD-10-CM

## 2015-10-20 DIAGNOSIS — B952 Enterococcus as the cause of diseases classified elsewhere: Secondary | ICD-10-CM

## 2015-10-20 DIAGNOSIS — N390 Urinary tract infection, site not specified: Secondary | ICD-10-CM

## 2015-10-20 LAB — URINALYSIS, MICROSCOPIC (CHCC SATELLITE)
BILIRUBIN (URINE): NEGATIVE
Glucose: 2000 mg/dL
Ketones: 5 mg/dL
NITRITE: NEGATIVE
PH: 6 (ref 4.60–8.00)
Specific Gravity, Urine: 1.01 (ref 1.003–1.035)
Urobilinogen, UR: 0.2 mg/dL (ref 0.2–1)

## 2015-10-20 MED ORDER — LEVOFLOXACIN 750 MG PO TABS: 750.0000 mg | ORAL_TABLET | Freq: Every day | ORAL | Status: DC

## 2015-10-20 NOTE — Telephone Encounter (Signed)
Patient stating that he believes he has another UTI. His T Max is 101.2. He has irritation. His wife changed out his foley and system today, including a betadine bath to the area. He also wanted to let Dr Marin Olp know that he stopped his Affinitor last Monday due to skin issues including breakdown. Spoke to Dr Marin Olp. He would like patient to bring in a urine sample for culture, and for patient to also consult his urologist due to frequent, more resistant UTIs. Dr Marin Olp will send in an antibiotic prescriptions. Spoke to Patient's wife and she will bring in urine sample and then call the urologist to make an appointment.

## 2015-10-21 ENCOUNTER — Encounter: Payer: Self-pay | Admitting: Hematology & Oncology

## 2015-10-24 LAB — URINE CULTURE

## 2015-11-03 ENCOUNTER — Other Ambulatory Visit: Payer: Self-pay | Admitting: Hematology & Oncology

## 2015-11-06 ENCOUNTER — Ambulatory Visit (HOSPITAL_BASED_OUTPATIENT_CLINIC_OR_DEPARTMENT_OTHER)
Admission: RE | Admit: 2015-11-06 | Discharge: 2015-11-06 | Disposition: A | Discharge status: Home / Self Care | Payer: 59 | Source: Ambulatory Visit | Attending: Hematology & Oncology | Admitting: Hematology & Oncology

## 2015-11-06 ENCOUNTER — Other Ambulatory Visit (HOSPITAL_BASED_OUTPATIENT_CLINIC_OR_DEPARTMENT_OTHER): Payer: 59

## 2015-11-06 ENCOUNTER — Ambulatory Visit (HOSPITAL_BASED_OUTPATIENT_CLINIC_OR_DEPARTMENT_OTHER): Payer: 59 | Admitting: Hematology & Oncology

## 2015-11-06 ENCOUNTER — Encounter (HOSPITAL_BASED_OUTPATIENT_CLINIC_OR_DEPARTMENT_OTHER): Payer: Self-pay

## 2015-11-06 ENCOUNTER — Ambulatory Visit (HOSPITAL_BASED_OUTPATIENT_CLINIC_OR_DEPARTMENT_OTHER): Payer: 59

## 2015-11-06 VITALS — BP 127/91 | HR 82 | Temp 97.6°F | Resp 18

## 2015-11-06 DIAGNOSIS — C759 Malignant neoplasm of endocrine gland, unspecified: Secondary | ICD-10-CM | POA: Diagnosis not present

## 2015-11-06 DIAGNOSIS — D509 Iron deficiency anemia, unspecified: Secondary | ICD-10-CM | POA: Diagnosis not present

## 2015-11-06 DIAGNOSIS — C7A8 Other malignant neuroendocrine tumors: Secondary | ICD-10-CM

## 2015-11-06 DIAGNOSIS — C7B02 Secondary carcinoid tumors of liver: Secondary | ICD-10-CM | POA: Diagnosis not present

## 2015-11-06 DIAGNOSIS — Q858 Other phakomatoses, not elsewhere classified: Secondary | ICD-10-CM | POA: Diagnosis not present

## 2015-11-06 DIAGNOSIS — E891 Postprocedural hypoinsulinemia: Secondary | ICD-10-CM

## 2015-11-06 DIAGNOSIS — C7A010 Malignant carcinoid tumor of the duodenum: Secondary | ICD-10-CM

## 2015-11-06 DIAGNOSIS — E139 Other specified diabetes mellitus without complications: Secondary | ICD-10-CM

## 2015-11-06 DIAGNOSIS — N281 Cyst of kidney, acquired: Secondary | ICD-10-CM | POA: Insufficient documentation

## 2015-11-06 DIAGNOSIS — Z90410 Acquired total absence of pancreas: Secondary | ICD-10-CM | POA: Insufficient documentation

## 2015-11-06 DIAGNOSIS — M488X4 Other specified spondylopathies, thoracic region: Secondary | ICD-10-CM | POA: Diagnosis not present

## 2015-11-06 DIAGNOSIS — Z9221 Personal history of antineoplastic chemotherapy: Secondary | ICD-10-CM | POA: Diagnosis not present

## 2015-11-06 DIAGNOSIS — C787 Secondary malignant neoplasm of liver and intrahepatic bile duct: Secondary | ICD-10-CM | POA: Diagnosis not present

## 2015-11-06 DIAGNOSIS — N319 Neuromuscular dysfunction of bladder, unspecified: Secondary | ICD-10-CM

## 2015-11-06 DIAGNOSIS — Z905 Acquired absence of kidney: Secondary | ICD-10-CM | POA: Diagnosis not present

## 2015-11-06 DIAGNOSIS — N2889 Other specified disorders of kidney and ureter: Secondary | ICD-10-CM | POA: Insufficient documentation

## 2015-11-06 LAB — CBC WITH DIFFERENTIAL (CANCER CENTER ONLY)
BASO#: 0 10*3/uL (ref 0.0–0.2)
BASO%: 0.4 % (ref 0.0–2.0)
EOS%: 2.9 % (ref 0.0–7.0)
Eosinophils Absolute: 0.2 10*3/uL (ref 0.0–0.5)
HEMATOCRIT: 42.8 % (ref 38.7–49.9)
HGB: 13.5 g/dL (ref 13.0–17.1)
LYMPH#: 1.7 10*3/uL (ref 0.9–3.3)
LYMPH%: 23.7 % (ref 14.0–48.0)
MCH: 25.6 pg — ABNORMAL LOW (ref 28.0–33.4)
MCHC: 31.5 g/dL — ABNORMAL LOW (ref 32.0–35.9)
MCV: 81 fL — AB (ref 82–98)
MONO#: 0.7 10*3/uL (ref 0.1–0.9)
MONO%: 9.8 % (ref 0.0–13.0)
NEUT#: 4.4 10*3/uL (ref 1.5–6.5)
NEUT%: 63.2 % (ref 40.0–80.0)
Platelets: 262 10*3/uL (ref 145–400)
RBC: 5.27 10*6/uL (ref 4.20–5.70)
RDW: 19.8 % — ABNORMAL HIGH (ref 11.1–15.7)
WBC: 7 10*3/uL (ref 4.0–10.0)

## 2015-11-06 LAB — COMPREHENSIVE METABOLIC PANEL (CC13)
ALT: 14 U/L (ref 0–55)
ANION GAP: 7 meq/L (ref 3–11)
AST: 23 U/L (ref 5–34)
Albumin: 3.2 g/dL — ABNORMAL LOW (ref 3.5–5.0)
Alkaline Phosphatase: 120 U/L (ref 40–150)
BILIRUBIN TOTAL: 0.53 mg/dL (ref 0.20–1.20)
BUN: 17.8 mg/dL (ref 7.0–26.0)
CALCIUM: 9.2 mg/dL (ref 8.4–10.4)
CO2: 31 meq/L — AB (ref 22–29)
CREATININE: 0.8 mg/dL (ref 0.7–1.3)
Chloride: 98 mEq/L (ref 98–109)
EGFR: >90 mL/min/{1.73_m2} (ref >90)
Glucose: 111 mg/dl (ref 70–140)
Potassium: 4.4 mEq/L (ref 3.5–5.1)
Sodium: 137 mEq/L (ref 136–145)
TOTAL PROTEIN: 7.2 g/dL (ref 6.4–8.3)

## 2015-11-06 MED ORDER — IOHEXOL 300 MG/ML  SOLN
90.0000 mL | Freq: Once | INTRAMUSCULAR | Status: AC | PRN
  Administered 2015-11-06: 90 mL via INTRAVENOUS

## 2015-11-06 MED ORDER — LANREOTIDE ACETATE 120 MG/0.5ML ~~LOC~~ SOLN
120.0000 mg | Freq: Once | SUBCUTANEOUS | Status: AC
  Administered 2015-11-06: 120 mg via SUBCUTANEOUS
  Filled 2015-11-06: qty 120

## 2015-11-06 NOTE — Patient Instructions (Signed)
Lanreotide injection What is this medicine? LANREOTIDE (lan REE oh tide) is used to reduce blood levels of growth hormone in patients with a condition called acromegaly. It also works to slow or stop tumor growth in patients with gastroenteropancreatic neuroendocrine tumor (GEP-NET). This medicine may be used for other purposes; ask your health care provider or pharmacist if you have questions. What should I tell my health care provider before I take this medicine? They need to know if you have any of these conditions: -diabetes -gallbladder disease -heart disease -kidney disease -liver disease -an unusual or allergic reaction to lanreotide, other medicines, latex, foods, dyes, or preservatives -pregnant or trying to get pregnant -breast-feeding How should I use this medicine? This medicine is for injection under the skin. It is given by a health care professional in a hospital or clinic setting. Contact your pediatrician or health care professional regarding the use of this medicine in children. Special care may be needed. Overdosage: If you think you have taken too much of this medicine contact a poison control center or emergency room at once. NOTE: This medicine is only for you. Do not share this medicine with others. What if I miss a dose? It is important not to miss your dose. Call your doctor or health care professional if you are unable to keep an appointment. What may interact with this medicine? -bromocriptine -cyclosporine -medicines for diabetes, including insulin -medicines for heart disease or hypertension -quinidine This list may not describe all possible interactions. Give your health care provider a list of all the medicines, herbs, non-prescription drugs, or dietary supplements you use. Also tell them if you smoke, drink alcohol, or use illegal drugs. Some items may interact with your medicine. What should I watch for while using this medicine? Visit your doctor or  health care professional for regular checks on your progress. Your condition will be monitored carefully while you are receiving this medicine. This medicine may cause increases or decreases in blood sugar. Signs of high blood sugar include frequent urination, unusual thirst, flushed or dry skin, difficulty breathing, drowsiness, stomach ache, nausea, vomiting or dry mouth. Signs of low blood sugar include chills, cool, pale skin or cold sweats, drowsiness, extreme hunger, fast heartbeat, headache, nausea, nervousness or anxiety, shakiness, trembling, unsteadiness, tiredness, or weakness. Contact your doctor or health care professional right away if you experience any of these symptoms. What side effects may I notice from receiving this medicine? Side effects that you should report to your doctor or health care professional as soon as possible: -allergic reactions like skin rash, itching or hives, swelling of the face, lips, or tongue -changes in blood sugar -changes in heart rate -severe stomach pain Side effects that usually do not require medical attention (report to your doctor or health care professional if they continue or are bothersome): -diarrhea or constipation -gas or stomach pain -nausea, vomiting -pain, redness, swelling and irritation at site where injected This list may not describe all possible side effects. Call your doctor for medical advice about side effects. You may report side effects to FDA at 1-800-FDA-1088. Where should I keep my medicine? This drug is given in a hospital or clinic and will not be stored at home. NOTE: This sheet is a summary. It may not cover all possible information. If you have questions about this medicine, talk to your doctor, pharmacist, or health care provider.    2016, [employer]/Gold Standard. (2013-11-21 17:43:04)  

## 2015-11-06 NOTE — Progress Notes (Signed)
Hematology and Oncology Follow Up Visit  [name] [id number] Nov 30, 1961 54 y.o. 11/06/2015   Principle Diagnosis:   Metastatic neuroendocrine carcinoma  Von Hipple-Lindau Syndrome  Current Therapy:  Afinitor 5 mg by mouth daily  Somatuline 120mg  SQ q month     Interim History:  [name] is back for followup. [name] latest problem has been another urinary tract infection. [name] has Escherichia coli. [name] has a chronically indwelling suprapubic catheter. Unfortunately, the Escherichia coli is becoming more and more resistant. It now is at the point where [name] really has only some sensitivity to IV antibiotics.  [name] actually looks quite good. [name] feels good. [name] blood sugars have not been all that bad. [name] wife does a great job in managing [name] diabetes.  [name] is not complaining of any pain.  [name] appetite has been doing pretty well.  We did go ahead and get scans on him today. The CT scans did not show any evidence of disease progression.  [name] had stopped [name] Afinitor. [name] said it was causing some skin issues with him. This is a rare side effect of Afinitor.  I think that we need to try to get him back on to Afinitor. Hopefully, a 5 mg daily dose will be tolerable. If [name] does well with the 5 mg dose, then we'll see him back, we will increase up to 7.5 mg.  [name]'s had no bleeding.  [name]'s had no diarrhea.  Overall, [name] performance status is ECOG 1.   Medications:  Current outpatient prescriptions:  .  baclofen (LIORESAL) 20 MG tablet, Take 20-40 mg by mouth 4 (four) times daily. , Disp: , Rfl:  .  diazepam (VALIUM) 5 MG tablet, TAKE 1 TABLET BY MOUTH 3 TIMES DAILY FOR SPASMS/ANXIETY, Disp: 40 tablet, Rfl: 1 .  doxazosin (CARDURA) 1 MG tablet, Take 1 tablet (1 mg total) by mouth 2 (two) times daily. (Patient taking differently: Take 1 mg by mouth 2 (two) times daily as needed (blood pressure spikes.). ), Disp: 60 tablet, Rfl: 2 .  dronabinol (MARINOL) 5 MG capsule, TAKE 1 CAPSULE BY  MOUTH TWICE DAILY WITH MEALS, Disp: 60 capsule, Rfl: 1 .  everolimus (AFINITOR) 10 MG tablet, Take 1 tablet (10 mg total) by mouth daily., Disp: 30 tablet, Rfl: 2 .  HYDROcodone-acetaminophen (NORCO/VICODIN) 5-325 MG tablet, Take 1 tablet by mouth every 6 (six) hours as needed for moderate pain., Disp: 90 tablet, Rfl: 0 .  HYDROmorphone (DILAUDID) 4 MG tablet, Take 4 mg by mouth every 6 (six) hours as needed for severe pain., Disp: , Rfl:  .  insulin glargine (LANTUS) 100 UNIT/ML injection, Inject 31 Units into the skin daily before breakfast. , Disp: , Rfl:  .  insulin lispro (HUMALOG) 100 UNIT/ML injection, Inject 3-9 Units into the skin 3 (three) times daily before meals. SS, Disp: , Rfl:  .  midodrine (PROAMATINE) 5 MG tablet, TKAE 1/2 TABLET BY MOUTH TWICE A DAY AS NEEDED (Patient taking differently: TKAE 1/2 TABLET BY MOUTH TWICE A DAY AS NEEDED BLOOD PRESSURE.), Disp: 60 tablet, Rfl: 6 .  Pancrelipase, Lip-Prot-Amyl, 24000 UNITS CPEP, Take 3 capsules by mouth 3 (three) times daily with meals., Disp: , Rfl:  .  promethazine (PHENERGAN) 12.5 MG tablet, Take 12.5 mg by mouth every 6 (six) hours as needed for nausea. , Disp: , Rfl:  .  tiZANidine (ZANAFLEX) 4 MG tablet, TAKE 2 TABLETS BY MOUTH EVERY 6 HOURS AS NEEDED FOR PAIN, Disp: 30 tablet, Rfl: 3 .  traMADol (ULTRAM) 50 MG tablet, TAKE 1 TABLET BY MOUTH 3 TIMES DAILY, Disp: 90 tablet, Rfl: 2 .  levofloxacin (LEVAQUIN) 750 MG tablet, Take 1 tablet (750 mg total) by mouth daily. (Patient not taking: Reported on 11/06/2015), Disp: 7 tablet, Rfl: 1  Allergies:  Allergies  Allergen Reactions  . Ciprocin-Fluocin-Procin [Fluocinolone Acetonide] Rash    Pt states this happened with IV Cipro. ABLE TO TAKE PO CIPRO.  . Other Rash    IV-CIPRO    Past Medical History, Surgical history, Social history, and Family History were reviewed and updated.  Review of Systems: As above  Physical Exam:  oral temperature is 97.6 F (36.4 C). [name] blood  pressure is 127/91 and [name] pulse is 82. [name] respiration is 18.   Well-developed and well-nourished gentleman. 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 [name] head exam shows no ocular or oral lesions. [name] has no palpable cervical or supraclavicular lymph nodes. [name] lungs are clear. Cardiac exam regular rate and rhythm with no murmurs, rubs or bruits. Abdomen is soft. Has good bowel sounds. There is no fluid wave. [name] has a well-healed laparotomy scar is. [name] has no obvious abdominal mass. There is no obvious liver or spleen tip. Back exam shows a laminectomy scar in the neck and upper thoracic spine. Extremities shows no swelling in [name] lower legs. [name] has no strength secondary to the paralysis. Neurological exam is nonfocal. Skin exam shows no rashes.  Lab Results  Component Value Date   WBC 7.0 11/06/2015   HGB 13.5 11/06/2015   HCT 42.8 11/06/2015   MCV 81* 11/06/2015   PLT 262 11/06/2015     Chemistry      Component Value Date/Time   NA 137 11/06/2015 1315   NA 138 09/27/2015 0518   NA 136 07/22/2015 0937   K 4.4 11/06/2015 1315   K 3.7 09/27/2015 0518   K 4.2 07/22/2015 0937   CL 102 09/27/2015 0518   CL 96* 07/22/2015 0937   CO2 31* 11/06/2015 1315   CO2 29 09/27/2015 0518   CO2 28 07/22/2015 0937   BUN 17.8 11/06/2015 1315   BUN 10 09/27/2015 0518   BUN 20 07/22/2015 0937   CREATININE 0.8 11/06/2015 1315   CREATININE 0.75 09/27/2015 0518   CREATININE 0.9 07/22/2015 0937      Component Value Date/Time   CALCIUM 9.2 11/06/2015 1315   CALCIUM 8.3* 09/27/2015 0518   CALCIUM 9.4 07/22/2015 0937   ALKPHOS 120 11/06/2015 1315   ALKPHOS 103 09/25/2015 0250   ALKPHOS 150* 07/22/2015 0937   AST 23 11/06/2015 1315   AST 31 09/25/2015 0250   AST 45* 07/22/2015 0937   ALT 14 11/06/2015 1315   ALT 20 09/25/2015 0250   ALT 27 07/22/2015 0937   BILITOT 0.53 11/06/2015 1315   BILITOT 0.5 09/25/2015 0250   BILITOT 0.90 07/22/2015 0937         Impression and Plan: [name] is  54 year old gentleman with von Hippel-Lindau syndrome. [name] has multiple neuroendocrine tumors. 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 [name]'s had multiple spinal surgeries.  I'm glad that [name] CT scan shows that everything is stable with [name] underlying malignancy.  I really think that we have her try to get him back on to Afinitor. Hopefully, we can get him on 5 mg daily dose.  I don't think we had to do another set of scans on him for another 2-3 months.  [name] will get [name]  Somatuline today.  [name]'s not sure if [name] will be going [facility] for a visit. [name] may want to reschedule this.  I will plan to see him back in one month.  Volanda Napoleon, MD 12/1/20164:29 PM

## 2015-11-07 LAB — IRON AND TIBC CHCC
%SAT: 28 % (ref 20–55)
Iron: 69 ug/dL (ref 42–163)
TIBC: 245 ug/dL (ref 202–409)
UIBC: 176 ug/dL (ref 117–376)

## 2015-11-07 LAB — FERRITIN CHCC: FERRITIN: 210 ng/mL (ref 22–316)

## 2015-11-11 LAB — CHROMOGRANIN A: Chromogranin A: 23 ng/mL — ABNORMAL HIGH (ref <=15)

## 2015-11-25 DIAGNOSIS — E108 Type 1 diabetes mellitus with unspecified complications: Secondary | ICD-10-CM | POA: Diagnosis not present

## 2015-11-25 DIAGNOSIS — Q858 Other phakomatoses, not elsewhere classified: Secondary | ICD-10-CM | POA: Diagnosis not present

## 2015-11-25 DIAGNOSIS — H35371 Puckering of macula, right eye: Secondary | ICD-10-CM | POA: Diagnosis not present

## 2015-11-25 DIAGNOSIS — Z970 Presence of artificial eye: Secondary | ICD-10-CM | POA: Diagnosis not present

## 2015-11-28 DIAGNOSIS — Q858 Other phakomatoses, not elsewhere classified: Secondary | ICD-10-CM | POA: Diagnosis not present

## 2015-12-02 ENCOUNTER — Other Ambulatory Visit (HOSPITAL_BASED_OUTPATIENT_CLINIC_OR_DEPARTMENT_OTHER): Payer: 59

## 2015-12-02 ENCOUNTER — Ambulatory Visit (HOSPITAL_BASED_OUTPATIENT_CLINIC_OR_DEPARTMENT_OTHER): Payer: 59

## 2015-12-02 ENCOUNTER — Encounter: Payer: Self-pay | Admitting: Hematology & Oncology

## 2015-12-02 ENCOUNTER — Ambulatory Visit (HOSPITAL_BASED_OUTPATIENT_CLINIC_OR_DEPARTMENT_OTHER): Payer: 59 | Admitting: Hematology & Oncology

## 2015-12-02 VITALS — BP 125/79 | HR 87 | Temp 97.6°F | Resp 16 | Ht 72.0 in | Wt 149.0 lb

## 2015-12-02 DIAGNOSIS — Z90410 Acquired total absence of pancreas: Secondary | ICD-10-CM

## 2015-12-02 DIAGNOSIS — C7A8 Other malignant neuroendocrine tumors: Secondary | ICD-10-CM

## 2015-12-02 DIAGNOSIS — E139 Other specified diabetes mellitus without complications: Secondary | ICD-10-CM

## 2015-12-02 DIAGNOSIS — D509 Iron deficiency anemia, unspecified: Secondary | ICD-10-CM | POA: Diagnosis not present

## 2015-12-02 DIAGNOSIS — N319 Neuromuscular dysfunction of bladder, unspecified: Secondary | ICD-10-CM

## 2015-12-02 DIAGNOSIS — E891 Postprocedural hypoinsulinemia: Secondary | ICD-10-CM

## 2015-12-02 LAB — CMP (CANCER CENTER ONLY)
ALT(SGPT): 25 U/L (ref 10–47)
AST: 34 U/L (ref 11–38)
Albumin: 3 g/dL — ABNORMAL LOW (ref 3.3–5.5)
Alkaline Phosphatase: 123 U/L — ABNORMAL HIGH (ref 26–84)
BUN: 19 mg/dL (ref 7–22)
CHLORIDE: 97 meq/L — AB (ref 98–108)
CO2: 30 mEq/L (ref 18–33)
CREATININE: 0.9 mg/dL (ref 0.6–1.2)
Calcium: 8.8 mg/dL (ref 8.0–10.3)
GLUCOSE: 166 mg/dL — AB (ref 73–118)
POTASSIUM: 3.7 meq/L (ref 3.3–4.7)
SODIUM: 139 meq/L (ref 128–145)
Total Bilirubin: 0.6 mg/dl (ref 0.20–1.60)
Total Protein: 7 g/dL (ref 6.4–8.1)

## 2015-12-02 LAB — CBC WITH DIFFERENTIAL (CANCER CENTER ONLY)
BASO#: 0 10*3/uL (ref 0.0–0.2)
BASO%: 0.1 % (ref 0.0–2.0)
EOS ABS: 0.1 10*3/uL (ref 0.0–0.5)
EOS%: 0.7 % (ref 0.0–7.0)
HCT: 45.6 % (ref 38.7–49.9)
HGB: 14.6 g/dL (ref 13.0–17.1)
LYMPH#: 1.4 10*3/uL (ref 0.9–3.3)
LYMPH%: 10 % — AB (ref 14.0–48.0)
MCH: 26.4 pg — AB (ref 28.0–33.4)
MCHC: 32 g/dL (ref 32.0–35.9)
MCV: 83 fL (ref 82–98)
MONO#: 1.1 10*3/uL — AB (ref 0.1–0.9)
MONO%: 7.5 % (ref 0.0–13.0)
NEUT#: 11.5 10*3/uL — ABNORMAL HIGH (ref 1.5–6.5)
NEUT%: 81.7 % — AB (ref 40.0–80.0)
PLATELETS: 250 10*3/uL (ref 145–400)
RBC: 5.52 10*6/uL (ref 4.20–5.70)
RDW: 17.4 % — AB (ref 11.1–15.7)
WBC: 14 10*3/uL — AB (ref 4.0–10.0)

## 2015-12-02 MED ORDER — LANREOTIDE ACETATE 120 MG/0.5ML ~~LOC~~ SOLN
120.0000 mg | Freq: Once | SUBCUTANEOUS | Status: AC
  Administered 2015-12-02: 120 mg via SUBCUTANEOUS
  Filled 2015-12-02: qty 120

## 2015-12-02 NOTE — Patient Instructions (Signed)
Lanreotide injection What is this medicine? LANREOTIDE (lan REE oh tide) is used to reduce blood levels of growth hormone in patients with a condition called acromegaly. It also works to slow or stop tumor growth in patients with gastroenteropancreatic neuroendocrine tumor (GEP-NET). This medicine may be used for other purposes; ask your health care provider or pharmacist if you have questions. What should I tell my health care provider before I take this medicine? They need to know if you have any of these conditions: -diabetes -gallbladder disease -heart disease -kidney disease -liver disease -an unusual or allergic reaction to lanreotide, other medicines, latex, foods, dyes, or preservatives -pregnant or trying to get pregnant -breast-feeding How should I use this medicine? This medicine is for injection under the skin. It is given by a health care professional in a hospital or clinic setting. Contact your pediatrician or health care professional regarding the use of this medicine in children. Special care may be needed. Overdosage: If you think you have taken too much of this medicine contact a poison control center or emergency room at once. NOTE: This medicine is only for you. Do not share this medicine with others. What if I miss a dose? It is important not to miss your dose. Call your doctor or health care professional if you are unable to keep an appointment. What may interact with this medicine? -bromocriptine -cyclosporine -medicines for diabetes, including insulin -medicines for heart disease or hypertension -quinidine This list may not describe all possible interactions. Give your health care provider a list of all the medicines, herbs, non-prescription drugs, or dietary supplements you use. Also tell them if you smoke, drink alcohol, or use illegal drugs. Some items may interact with your medicine. What should I watch for while using this medicine? Visit your doctor or  health care professional for regular checks on your progress. Your condition will be monitored carefully while you are receiving this medicine. This medicine may cause increases or decreases in blood sugar. Signs of high blood sugar include frequent urination, unusual thirst, flushed or dry skin, difficulty breathing, drowsiness, stomach ache, nausea, vomiting or dry mouth. Signs of low blood sugar include chills, cool, pale skin or cold sweats, drowsiness, extreme hunger, fast heartbeat, headache, nausea, nervousness or anxiety, shakiness, trembling, unsteadiness, tiredness, or weakness. Contact your doctor or health care professional right away if you experience any of these symptoms. What side effects may I notice from receiving this medicine? Side effects that you should report to your doctor or health care professional as soon as possible: -allergic reactions like skin rash, itching or hives, swelling of the face, lips, or tongue -changes in blood sugar -changes in heart rate -severe stomach pain Side effects that usually do not require medical attention (report to your doctor or health care professional if they continue or are bothersome): -diarrhea or constipation -gas or stomach pain -nausea, vomiting -pain, redness, swelling and irritation at site where injected This list may not describe all possible side effects. Call your doctor for medical advice about side effects. You may report side effects to FDA at 1-800-FDA-1088. Where should I keep my medicine? This drug is given in a hospital or clinic and will not be stored at home. NOTE: This sheet is a summary. It may not cover all possible information. If you have questions about this medicine, talk to your doctor, pharmacist, or health care provider.    2016, [employer]/Gold Standard. (2013-11-21 17:43:04)  

## 2015-12-02 NOTE — Progress Notes (Signed)
Hematology and Oncology Follow Up Visit  [name] [id number] 1961/07/17 54 y.o. 12/02/2015   Principle Diagnosis:   Metastatic neuroendocrine carcinoma  Von Hipple-Lindau Syndrome  Current Therapy:  Afinitor 5 mg by mouth daily  Somatuline 120mg  SQ q month     Interim History:  Mr.  Strole is back for followup. Surprisingly enough, he is going up to [place] to the [place] in early January for spine surgery. 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 He is now is getting weak in his right arm. He apparently has involvement of C3/C4.  I told to go ahead and stop the Afinitor for right now.  He looks quite good. He had a good Christmas.  His chromogranin A is holding steady at 23.  He's had no problems blood sugars. His wife is very diligent in making sure that his blood sugars are well-controlled.  He really is bothered by the catheter in his bladder. Unfortunately, there is now much second be done about this.  Overall, his performance status is ECOG 1.   Medications:  Current outpatient prescriptions:  .  baclofen (LIORESAL) 20 MG tablet, Take 20-40 mg by mouth 4 (four) times daily. , Disp: , Rfl:  .  diazepam (VALIUM) 5 MG tablet, TAKE 1 TABLET BY MOUTH 3 TIMES DAILY FOR SPASMS/ANXIETY, Disp: 40 tablet, Rfl: 1 .  doxazosin (CARDURA) 1 MG tablet, Take 1 tablet (1 mg total) by mouth 2 (two) times daily. (Patient taking differently: Take 1 mg by mouth 2 (two) times daily as needed (blood pressure spikes.). ), Disp: 60 tablet, Rfl: 2 .  dronabinol (MARINOL) 5 MG capsule, TAKE 1 CAPSULE BY MOUTH TWICE DAILY WITH MEALS, Disp: 60 capsule, Rfl: 1 .  everolimus (AFINITOR) 10 MG tablet, Take 1 tablet (10 mg total) by mouth daily., Disp: 30 tablet, Rfl: 2 .  HYDROcodone-acetaminophen (NORCO/VICODIN) 5-325 MG tablet, Take 1 tablet by mouth every 6 (six) hours as needed for moderate pain., Disp: 90 tablet, Rfl: 0 .   HYDROmorphone (DILAUDID) 4 MG tablet, Take 4 mg by mouth every 6 (six) hours as needed for severe pain., Disp: , Rfl:  .  insulin glargine (LANTUS) 100 UNIT/ML injection, Inject 31 Units into the skin daily before breakfast. , Disp: , Rfl:  .  insulin lispro (HUMALOG) 100 UNIT/ML injection, Inject 3-9 Units into the skin 3 (three) times daily before meals. SS, Disp: , Rfl:  .  levofloxacin (LEVAQUIN) 750 MG tablet, Take 1 tablet (750 mg total) by mouth daily., Disp: 7 tablet, Rfl: 1 .  midodrine (PROAMATINE) 5 MG tablet, TKAE 1/2 TABLET BY MOUTH TWICE A DAY AS NEEDED (Patient taking differently: TKAE 1/2 TABLET BY MOUTH TWICE A DAY AS NEEDED BLOOD PRESSURE.), Disp: 60 tablet, Rfl: 6 .  Pancrelipase, Lip-Prot-Amyl, 24000 UNITS CPEP, Take 3 capsules by mouth 3 (three) times daily with meals., Disp: , Rfl:  .  promethazine (PHENERGAN) 12.5 MG tablet, Take 12.5 mg by mouth every 6 (six) hours as needed for nausea. , Disp: , Rfl:  .  tiZANidine (ZANAFLEX) 4 MG tablet, TAKE 2 TABLETS BY MOUTH EVERY 6 HOURS AS NEEDED FOR PAIN, Disp: 30 tablet, Rfl: 3 .  traMADol (ULTRAM) 50 MG tablet, TAKE 1 TABLET BY MOUTH 3 TIMES DAILY, Disp: 90 tablet, Rfl: 2  Allergies:  Allergies  Allergen Reactions  . Ciprocin-Fluocin-Procin [Fluocinolone Acetonide] Rash    Pt states this happened with IV Cipro. ABLE  TO TAKE PO CIPRO.  . Other Rash    IV-CIPRO    Past Medical History, Surgical history, Social history, and Family History were reviewed and updated.  Review of Systems: As above  Physical Exam:  height is 6' (1.829 m) and weight is 149 lb (67.586 kg). His oral temperature is 97.6 F (36.4 C). His blood pressure is 125/79 and his pulse is 87. His respiration is 16.   Well-developed and well-nourished gentleman. He is paralyzed from the waist down. His head exam shows no ocular or oral lesions. He has no palpable cervical or supraclavicular lymph nodes. His lungs are clear. Cardiac exam regular rate and rhythm  with no murmurs, rubs or bruits. Abdomen is soft. Has good bowel sounds. There is no fluid wave. He has a well-healed laparotomy scar is. He has no obvious abdominal mass. There is no obvious liver or spleen tip. Back exam shows a laminectomy scar in the neck and upper thoracic spine. Extremities shows no swelling in his lower legs. He has no strength secondary to the paralysis. Neurological exam is nonfocal. Skin exam shows no rashes.  Lab Results  Component Value Date   WBC 14.0* 12/02/2015   HGB 14.6 12/02/2015   HCT 45.6 12/02/2015   MCV 83 12/02/2015   PLT 250 12/02/2015     Chemistry      Component Value Date/Time   NA 139 12/02/2015 1107   NA 137 11/06/2015 1315   NA 138 09/27/2015 0518   K 3.7 12/02/2015 1107   K 4.4 11/06/2015 1315   K 3.7 09/27/2015 0518   CL 97* 12/02/2015 1107   CL 102 09/27/2015 0518   CO2 30 12/02/2015 1107   CO2 31* 11/06/2015 1315   CO2 29 09/27/2015 0518   BUN 19 12/02/2015 1107   BUN 17.8 11/06/2015 1315   BUN 10 09/27/2015 0518   CREATININE 0.9 12/02/2015 1107   CREATININE 0.8 11/06/2015 1315   CREATININE 0.75 09/27/2015 0518      Component Value Date/Time   CALCIUM 8.8 12/02/2015 1107   CALCIUM 9.2 11/06/2015 1315   CALCIUM 8.3* 09/27/2015 0518   ALKPHOS 123* 12/02/2015 1107   ALKPHOS 120 11/06/2015 1315   ALKPHOS 103 09/25/2015 0250   AST 34 12/02/2015 1107   AST 23 11/06/2015 1315   AST 31 09/25/2015 0250   ALT 25 12/02/2015 1107   ALT 14 11/06/2015 1315   ALT 20 09/25/2015 0250   BILITOT 0.60 12/02/2015 1107   BILITOT 0.53 11/06/2015 1315   BILITOT 0.5 09/25/2015 0250         Impression and Plan: Mr. Klay is 54 year old gentleman with von Hippel-Lindau syndrome. He has multiple neuroendocrine tumors. 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 He's had multiple spinal surgeries.  I I am surprised that he is going to have the spine surgery. It sounds like he has tumors at C3/C4 that are causing  problems with the right arm.  I'm sure that he will do well with his surgery.  I will plan to get him back the end of January. By then, he should be back from [place].  I think that holding the Afinitor until we see him back within did not be about idea.  We do not have to do any scans on him probably until February or March.   Volanda Napoleon, MD 12/27/201612:27 PM

## 2015-12-05 LAB — CHROMOGRANIN A: CHROMOGRANIN A: 29 ng/mL — AB (ref <=15)

## 2015-12-08 MED FILL — tiZANidine HCL 4 MG TABS: 4 | 4 days supply | Qty: 30 | Fill #2

## 2015-12-09 MED FILL — ACCU-CHEK SMARTVIEW TEST ST: 25 days supply | Qty: 100 | Fill #6

## 2015-12-09 MED FILL — OXYBUTYNIN 5 MG TABLET: 5 | 30 days supply | Qty: 60 | Fill #0

## 2015-12-10 MED FILL — BACLOFEN 20 MG TABLET: 20 | 30 days supply | Qty: 120 | Fill #1

## 2015-12-31 ENCOUNTER — Other Ambulatory Visit: Payer: Self-pay | Admitting: Hematology & Oncology

## 2015-12-31 MED FILL — DRONABINOL 5 MG CAPSULE: 5 | 30 days supply | Qty: 60 | Fill #0

## 2016-01-09 DIAGNOSIS — L929 Granulomatous disorder of the skin and subcutaneous tissue, unspecified: Secondary | ICD-10-CM | POA: Diagnosis not present

## 2016-01-09 DIAGNOSIS — Q858 Other phakomatoses, not elsewhere classified: Secondary | ICD-10-CM | POA: Diagnosis not present

## 2016-01-09 DIAGNOSIS — N99528 Other complication of other external stoma of urinary tract: Secondary | ICD-10-CM | POA: Diagnosis not present

## 2016-01-09 DIAGNOSIS — N310 Uninhibited neuropathic bladder, not elsewhere classified: Secondary | ICD-10-CM | POA: Diagnosis not present

## 2016-01-09 DIAGNOSIS — N3020 Other chronic cystitis without hematuria: Secondary | ICD-10-CM | POA: Diagnosis not present

## 2016-01-11 MED FILL — LANTUS 100 UNITS/ML VIAL: 100 | 28 days supply | Qty: 10 | Fill #6

## 2016-01-15 ENCOUNTER — Other Ambulatory Visit (HOSPITAL_BASED_OUTPATIENT_CLINIC_OR_DEPARTMENT_OTHER): Payer: 59

## 2016-01-15 ENCOUNTER — Ambulatory Visit (HOSPITAL_BASED_OUTPATIENT_CLINIC_OR_DEPARTMENT_OTHER): Payer: 59

## 2016-01-15 ENCOUNTER — Ambulatory Visit (HOSPITAL_BASED_OUTPATIENT_CLINIC_OR_DEPARTMENT_OTHER): Payer: 59 | Admitting: Hematology & Oncology

## 2016-01-15 ENCOUNTER — Encounter: Payer: Self-pay | Admitting: Hematology & Oncology

## 2016-01-15 VITALS — BP 93/70 | HR 93 | Temp 97.5°F | Resp 14 | Ht 72.0 in | Wt 149.0 lb

## 2016-01-15 DIAGNOSIS — C7B8 Other secondary neuroendocrine tumors: Secondary | ICD-10-CM

## 2016-01-15 DIAGNOSIS — C7A8 Other malignant neuroendocrine tumors: Secondary | ICD-10-CM

## 2016-01-15 DIAGNOSIS — Z90410 Acquired total absence of pancreas: Secondary | ICD-10-CM

## 2016-01-15 DIAGNOSIS — E891 Postprocedural hypoinsulinemia: Secondary | ICD-10-CM | POA: Diagnosis not present

## 2016-01-15 DIAGNOSIS — N319 Neuromuscular dysfunction of bladder, unspecified: Secondary | ICD-10-CM

## 2016-01-15 DIAGNOSIS — D509 Iron deficiency anemia, unspecified: Secondary | ICD-10-CM

## 2016-01-15 DIAGNOSIS — E139 Other specified diabetes mellitus without complications: Secondary | ICD-10-CM

## 2016-01-15 DIAGNOSIS — Z23 Encounter for immunization: Secondary | ICD-10-CM

## 2016-01-15 LAB — COMPREHENSIVE METABOLIC PANEL
ALK PHOS: 146 U/L (ref 40–150)
ALT: 29 U/L (ref 0–55)
AST: 29 U/L (ref 5–34)
Albumin: 3.2 g/dL — ABNORMAL LOW (ref 3.5–5.0)
Anion Gap: 10 mEq/L (ref 3–11)
BUN: 19.8 mg/dL (ref 7.0–26.0)
CALCIUM: 9 mg/dL (ref 8.4–10.4)
CO2: 26 mEq/L (ref 22–29)
CREATININE: 0.8 mg/dL (ref 0.7–1.3)
Chloride: 100 mEq/L (ref 98–109)
Glucose: 132 mg/dl (ref 70–140)
Potassium: 4 mEq/L (ref 3.5–5.1)
SODIUM: 136 meq/L (ref 136–145)
Total Bilirubin: 0.87 mg/dL (ref 0.20–1.20)
Total Protein: 6.9 g/dL (ref 6.4–8.3)

## 2016-01-15 LAB — CBC WITH DIFFERENTIAL (CANCER CENTER ONLY)
BASO#: 0 10*3/uL (ref 0.0–0.2)
BASO%: 0.2 % (ref 0.0–2.0)
EOS ABS: 0.1 10*3/uL (ref 0.0–0.5)
EOS%: 1.5 % (ref 0.0–7.0)
HEMATOCRIT: 43.9 % (ref 38.7–49.9)
HGB: 14.2 g/dL (ref 13.0–17.1)
LYMPH#: 2 10*3/uL (ref 0.9–3.3)
LYMPH%: 24.9 % (ref 14.0–48.0)
MCH: 26.7 pg — AB (ref 28.0–33.4)
MCHC: 32.3 g/dL (ref 32.0–35.9)
MCV: 83 fL (ref 82–98)
MONO#: 0.8 10*3/uL (ref 0.1–0.9)
MONO%: 9.5 % (ref 0.0–13.0)
NEUT#: 5.2 10*3/uL (ref 1.5–6.5)
NEUT%: 63.9 % (ref 40.0–80.0)
Platelets: 290 10*3/uL (ref 145–400)
RBC: 5.31 10*6/uL (ref 4.20–5.70)
RDW: 13.9 % (ref 11.1–15.7)
WBC: 8.1 10*3/uL (ref 4.0–10.0)

## 2016-01-15 MED ORDER — HYDROCODONE-ACETAMINOPHEN 5-325 MG PO TABS: 1.0000 | ORAL_TABLET | Freq: Four times a day (QID) | ORAL | Status: DC | PRN

## 2016-01-15 MED ORDER — LANREOTIDE ACETATE 120 MG/0.5ML ~~LOC~~ SOLN
120.0000 mg | Freq: Once | SUBCUTANEOUS | Status: AC
  Administered 2016-01-15: 120 mg via SUBCUTANEOUS
  Filled 2016-01-15: qty 120

## 2016-01-15 MED ORDER — GABAPENTIN 300 MG PO CAPS: 600.0000 mg | ORAL_CAPSULE | Freq: Three times a day (TID) | ORAL | Status: DC

## 2016-01-15 MED FILL — GABAPENTIN 300 MG CAPSULE: 300 | 30 days supply | Qty: 180 | Fill #0

## 2016-01-15 NOTE — Patient Instructions (Signed)
Lanreotide injection What is this medicine? LANREOTIDE (lan REE oh tide) is used to reduce blood levels of growth hormone in patients with a condition called acromegaly. It also works to slow or stop tumor growth in patients with gastroenteropancreatic neuroendocrine tumor (GEP-NET). This medicine may be used for other purposes; ask your health care provider or pharmacist if you have questions. What should I tell my health care provider before I take this medicine? They need to know if you have any of these conditions: -diabetes -gallbladder disease -heart disease -kidney disease -liver disease -an unusual or allergic reaction to lanreotide, other medicines, latex, foods, dyes, or preservatives -pregnant or trying to get pregnant -breast-feeding How should I use this medicine? This medicine is for injection under the skin. It is given by a health care professional in a hospital or clinic setting. Contact your pediatrician or health care professional regarding the use of this medicine in children. Special care may be needed. Overdosage: If you think you have taken too much of this medicine contact a poison control center or emergency room at once. NOTE: This medicine is only for you. Do not share this medicine with others. What if I miss a dose? It is important not to miss your dose. Call your doctor or health care professional if you are unable to keep an appointment. What may interact with this medicine? -bromocriptine -cyclosporine -medicines for diabetes, including insulin -medicines for heart disease or hypertension -quinidine This list may not describe all possible interactions. Give your health care provider a list of all the medicines, herbs, non-prescription drugs, or dietary supplements you use. Also tell them if you smoke, drink alcohol, or use illegal drugs. Some items may interact with your medicine. What should I watch for while using this medicine? Visit your doctor or  health care professional for regular checks on your progress. Your condition will be monitored carefully while you are receiving this medicine. This medicine may cause increases or decreases in blood sugar. Signs of high blood sugar include frequent urination, unusual thirst, flushed or dry skin, difficulty breathing, drowsiness, stomach ache, nausea, vomiting or dry mouth. Signs of low blood sugar include chills, cool, pale skin or cold sweats, drowsiness, extreme hunger, fast heartbeat, headache, nausea, nervousness or anxiety, shakiness, trembling, unsteadiness, tiredness, or weakness. Contact your doctor or health care professional right away if you experience any of these symptoms. What side effects may I notice from receiving this medicine? Side effects that you should report to your doctor or health care professional as soon as possible: -allergic reactions like skin rash, itching or hives, swelling of the face, lips, or tongue -changes in blood sugar -changes in heart rate -severe stomach pain Side effects that usually do not require medical attention (report to your doctor or health care professional if they continue or are bothersome): -diarrhea or constipation -gas or stomach pain -nausea, vomiting -pain, redness, swelling and irritation at site where injected This list may not describe all possible side effects. Call your doctor for medical advice about side effects. You may report side effects to FDA at 1-800-FDA-1088. Where should I keep my medicine? This drug is given in a hospital or clinic and will not be stored at home. NOTE: This sheet is a summary. It may not cover all possible information. If you have questions about this medicine, talk to your doctor, pharmacist, or health care provider.    2016, [employer]/Gold Standard. (2013-11-21 17:43:04)  

## 2016-01-15 NOTE — Progress Notes (Signed)
Hematology and Oncology Follow Up Visit  Dilan Netro [id number] Jul 29, 1961 55 y.o. 01/15/2016   Principle Diagnosis:   Metastatic neuroendocrine carcinoma  Von Hipple-Lindau Syndrome  Current Therapy:  Afinitor 5 mg by mouth daily  Somatuline 120mg  SQ q month     Interim History:  Mr.  [name] is back for followup. He was about [facility] back in January. He is other for about 10 days. 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 He is having some blood pressure issues right now. This might be an autonomic issue.  He is having hyperesthesia in his hands. It sounds like they had to do some manipulating of the spinal cord and nerves. He is on Neurontin right now.  He's having no further abdominal spasms.  His blood sugars have been on the higher side. This is because of the steroids that he was on for his surgery.   His last chromogranin A level was 29 back in December.   He did have a urinary tract issue up in [place]. Again, he's had issues with Escherichia coli in the urine. This is becoming more resilient.   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 He does have a suprapubic catheter in.   Currently, his overall performance status is ECOG 1.   Medications:  Current outpatient prescriptions:  .  baclofen (LIORESAL) 20 MG tablet, Take 20-40 mg by mouth 4 (four) times daily. , Disp: , Rfl:  .  diazepam (VALIUM) 5 MG tablet, TAKE 1 TABLET BY MOUTH 3 TIMES DAILY FOR SPASMS/ANXIETY, Disp: 40 tablet, Rfl: 1 .  doxazosin (CARDURA) 1 MG tablet, Take 1 tablet (1 mg total) by mouth 2 (two) times daily. (Patient taking differently: Take 1 mg by mouth 2 (two) times daily as needed (blood pressure spikes.). ), Disp: 60 tablet, Rfl: 2 .  dronabinol (MARINOL) 5 MG capsule, TAKE 1 CAPSULE BY MOUTH TWICE DAILY WITH MEALS, Disp: 60 capsule, Rfl: 1 .  everolimus (AFINITOR) 10 MG tablet, Take 1 tablet (10 mg total) by mouth daily., Disp: 30 tablet, Rfl: 2 .   HYDROcodone-acetaminophen (NORCO/VICODIN) 5-325 MG tablet, Take 1 tablet by mouth every 6 (six) hours as needed for moderate pain., Disp: 90 tablet, Rfl: 0 .  HYDROmorphone (DILAUDID) 4 MG tablet, Take 4 mg by mouth every 6 (six) hours as needed for severe pain., Disp: , Rfl:  .  insulin glargine (LANTUS) 100 UNIT/ML injection, Inject 31 Units into the skin daily before breakfast. , Disp: , Rfl:  .  insulin lispro (HUMALOG) 100 UNIT/ML injection, Inject 3-9 Units into the skin 3 (three) times daily before meals. SS, Disp: , Rfl:  .  midodrine (PROAMATINE) 5 MG tablet, TKAE 1/2 TABLET BY MOUTH TWICE A DAY AS NEEDED (Patient taking differently: TKAE 1/2 TABLET BY MOUTH TWICE A DAY AS NEEDED BLOOD PRESSURE.), Disp: 60 tablet, Rfl: 6 .  Pancrelipase, Lip-Prot-Amyl, 24000 UNITS CPEP, Take 3 capsules by mouth 3 (three) times daily with meals., Disp: , Rfl:  .  promethazine (PHENERGAN) 12.5 MG tablet, Take 12.5 mg by mouth every 6 (six) hours as needed for nausea. , Disp: , Rfl:  .  tiZANidine (ZANAFLEX) 4 MG tablet, TAKE 2 TABLETS BY MOUTH EVERY 6 HOURS AS NEEDED FOR PAIN, Disp: 30 tablet, Rfl: 3 .  traMADol (ULTRAM) 50 MG tablet, TAKE 1 TABLET BY MOUTH 3 TIMES DAILY, Disp: 90 tablet, Rfl: 2 .  gabapentin (NEURONTIN) 300 MG capsule, Take  2 capsules (600 mg total) by mouth 3 (three) times daily., Disp: 180 capsule, Rfl: 6  Allergies:  Allergies  Allergen Reactions  . Ciprocin-Fluocin-Procin [Fluocinolone Acetonide] Rash    Pt states this happened with IV Cipro. ABLE TO TAKE PO CIPRO.  . Other Rash    IV-CIPRO    Past Medical History, Surgical history, Social history, and Family History were reviewed and updated.  Review of Systems: As above  Physical Exam:  height is 6' (1.829 m) and weight is 149 lb (67.586 kg). His oral temperature is 97.5 F (36.4 C). His blood pressure is 93/70 and his pulse is 93. His respiration is 14.   Well-developed and well-nourished gentleman. He is paralyzed from  the waist down. His head exam shows no ocular or oral lesions. He has no palpable cervical or supraclavicular lymph nodes. His lungs are clear. Cardiac exam regular rate and rhythm with no murmurs, rubs or bruits. Abdomen is soft. Has good bowel sounds. There is no fluid wave. He has a well-healed laparotomy scar is. He has no obvious abdominal mass. There is no obvious liver or spleen tip. Back exam shows a laminectomy scar in the neck and upper thoracic spine. Extremities shows no swelling in his lower legs. He has no strength secondary to the paralysis. Neurological exam is nonfocal. Skin exam shows no rashes.  Lab Results  Component Value Date   WBC 8.1 01/15/2016   HGB 14.2 01/15/2016   HCT 43.9 01/15/2016   MCV 83 01/15/2016   PLT 290 01/15/2016     Chemistry      Component Value Date/Time   NA 136 01/15/2016 1348   NA 139 12/02/2015 1107   NA 138 09/27/2015 0518   K 4.0 01/15/2016 1348   K 3.7 12/02/2015 1107   K 3.7 09/27/2015 0518   CL 97* 12/02/2015 1107   CL 102 09/27/2015 0518   CO2 26 01/15/2016 1348   CO2 30 12/02/2015 1107   CO2 29 09/27/2015 0518   BUN 19.8 01/15/2016 1348   BUN 19 12/02/2015 1107   BUN 10 09/27/2015 0518   CREATININE 0.8 01/15/2016 1348   CREATININE 0.9 12/02/2015 1107   CREATININE 0.75 09/27/2015 0518      Component Value Date/Time   CALCIUM 9.0 01/15/2016 1348   CALCIUM 8.8 12/02/2015 1107   CALCIUM 8.3* 09/27/2015 0518   ALKPHOS 146 01/15/2016 1348   ALKPHOS 123* 12/02/2015 1107   ALKPHOS 103 09/25/2015 0250   AST 29 01/15/2016 1348   AST 34 12/02/2015 1107   AST 31 09/25/2015 0250   ALT 29 01/15/2016 1348   ALT 25 12/02/2015 1107   ALT 20 09/25/2015 0250   BILITOT 0.87 01/15/2016 1348   BILITOT 0.60 12/02/2015 1107   BILITOT 0.5 09/25/2015 0250         Impression and Plan: Mr. Beyler is 55 year old gentleman with von Hippel-Lindau syndrome. He has multiple neuroendocrine tumors. He is paralyzed because of spinal cord  involvement from a malignancy. He's had multiple spinal surgeries.  It looks like things went pretty well up in [place]. It looks like the surgeons were successful in removing the intradural tumor.  We now have to get him onto the Afinitor. He's been off this for several months. I want to start back on to 5 mg dose. I think he should tolerate this well.  I'm not going to put him through any scans until late March or early April.   I do worry about him  having recurrent urinary tract infections as the bacteria is becoming more and more resistant.    Volanda Napoleon, MD 2/9/20173:58 PM

## 2016-01-16 ENCOUNTER — Telehealth: Payer: Self-pay | Admitting: Hematology & Oncology

## 2016-01-16 LAB — IRON AND TIBC
%SAT: 26 % (ref 20–55)
Iron: 71 ug/dL (ref 42–163)
TIBC: 274 ug/dL (ref 202–409)
UIBC: 203 ug/dL (ref 117–376)

## 2016-01-16 LAB — CHROMOGRANIN A: Chromogranin A: 4 nmol/L (ref 0–5)

## 2016-01-16 LAB — FERRITIN: Ferritin: 61 ng/ml (ref 22–316)

## 2016-01-16 MED FILL — HYDROCODON-APAP 5-325: 5-325 | 22 days supply | Qty: 90 | Fill #0

## 2016-01-16 NOTE — Telephone Encounter (Signed)
Sent letter by mail to patient notifying patient of their next scheduled apt.       [facility].

## 2016-01-19 MED FILL — BACLOFEN 20 MG TABLET: 20 | 30 days supply | Qty: 120 | Fill #2

## 2016-01-19 MED FILL — traMADol HCL 50 MG TABS: 50 | 30 days supply | Qty: 90 | Fill #2

## 2016-01-20 LAB — CHROMOGRANIN A (PARALLEL TESTING): Chromogranin A: 23 ng/mL — ABNORMAL HIGH (ref <=15)

## 2016-01-22 MED FILL — ACCU-CHEK SMARTVIEW TEST ST: 25 days supply | Qty: 100 | Fill #7

## 2016-02-02 ENCOUNTER — Other Ambulatory Visit: Payer: Self-pay | Admitting: *Deleted

## 2016-02-02 DIAGNOSIS — Q8583 Von Hippel-Lindau syndrome: Secondary | ICD-10-CM

## 2016-02-02 DIAGNOSIS — Q858 Other phakomatoses, not elsewhere classified: Secondary | ICD-10-CM

## 2016-02-02 DIAGNOSIS — C7A8 Other malignant neuroendocrine tumors: Secondary | ICD-10-CM

## 2016-02-02 MED ORDER — DIAZEPAM 5 MG PO TABS: ORAL_TABLET | ORAL | Status: DC

## 2016-02-02 MED ORDER — EVEROLIMUS 10 MG PO TABS: 10.0000 mg | ORAL_TABLET | Freq: Every day | ORAL | Status: DC

## 2016-02-02 MED FILL — diazePAM 5 MG TABS: 5 | 30 days supply | Qty: 90 | Fill #0

## 2016-02-02 MED FILL — *AFINITOR 10MG TAB: 10 | 28 days supply | Qty: 28 | Fill #0

## 2016-02-03 MED FILL — DRONABINOL 5 MG CAPSULE: 5 | 30 days supply | Qty: 60 | Fill #1

## 2016-02-12 ENCOUNTER — Ambulatory Visit (HOSPITAL_BASED_OUTPATIENT_CLINIC_OR_DEPARTMENT_OTHER): Payer: 59

## 2016-02-12 ENCOUNTER — Other Ambulatory Visit (HOSPITAL_BASED_OUTPATIENT_CLINIC_OR_DEPARTMENT_OTHER): Payer: 59

## 2016-02-12 ENCOUNTER — Other Ambulatory Visit: Payer: Self-pay | Admitting: Hematology & Oncology

## 2016-02-12 ENCOUNTER — Encounter: Payer: Self-pay | Admitting: Hematology & Oncology

## 2016-02-12 ENCOUNTER — Ambulatory Visit (HOSPITAL_BASED_OUTPATIENT_CLINIC_OR_DEPARTMENT_OTHER): Payer: 59 | Admitting: Hematology & Oncology

## 2016-02-12 VITALS — BP 79/47 | HR 76 | Temp 97.6°F | Resp 18 | Ht 72.0 in | Wt 163.0 lb

## 2016-02-12 DIAGNOSIS — C7B8 Other secondary neuroendocrine tumors: Secondary | ICD-10-CM

## 2016-02-12 DIAGNOSIS — C7A8 Other malignant neuroendocrine tumors: Secondary | ICD-10-CM

## 2016-02-12 DIAGNOSIS — N319 Neuromuscular dysfunction of bladder, unspecified: Secondary | ICD-10-CM

## 2016-02-12 DIAGNOSIS — J101 Influenza due to other identified influenza virus with other respiratory manifestations: Secondary | ICD-10-CM

## 2016-02-12 DIAGNOSIS — Q858 Other phakomatoses, not elsewhere classified: Secondary | ICD-10-CM

## 2016-02-12 DIAGNOSIS — Z23 Encounter for immunization: Secondary | ICD-10-CM | POA: Diagnosis not present

## 2016-02-12 LAB — CBC WITH DIFFERENTIAL (CANCER CENTER ONLY)
BASO#: 0 10*3/uL (ref 0.0–0.2)
BASO%: 0.2 % (ref 0.0–2.0)
EOS ABS: 0.1 10*3/uL (ref 0.0–0.5)
EOS%: 1.2 % (ref 0.0–7.0)
HEMATOCRIT: 42.1 % (ref 38.7–49.9)
HGB: 13.8 g/dL (ref 13.0–17.1)
LYMPH#: 1.5 10*3/uL (ref 0.9–3.3)
LYMPH%: 27.3 % (ref 14.0–48.0)
MCH: 26.6 pg — ABNORMAL LOW (ref 28.0–33.4)
MCHC: 32.8 g/dL (ref 32.0–35.9)
MCV: 81 fL — ABNORMAL LOW (ref 82–98)
MONO#: 0.5 10*3/uL (ref 0.1–0.9)
MONO%: 9.4 % (ref 0.0–13.0)
NEUT#: 3.5 10*3/uL (ref 1.5–6.5)
NEUT%: 61.9 % (ref 40.0–80.0)
Platelets: 176 10*3/uL (ref 145–400)
RBC: 5.19 10*6/uL (ref 4.20–5.70)
RDW: 12.4 % (ref 11.1–15.7)
WBC: 5.7 10*3/uL (ref 4.0–10.0)

## 2016-02-12 LAB — COMPREHENSIVE METABOLIC PANEL
ALT: 28 U/L (ref 0–55)
AST: 29 U/L (ref 5–34)
Albumin: 3 g/dL — ABNORMAL LOW (ref 3.5–5.0)
Alkaline Phosphatase: 141 U/L (ref 40–150)
Anion Gap: 10 mEq/L (ref 3–11)
BUN: 19.2 mg/dL (ref 7.0–26.0)
CALCIUM: 8.6 mg/dL (ref 8.4–10.4)
CHLORIDE: 99 meq/L (ref 98–109)
CO2: 27 mEq/L (ref 22–29)
Creatinine: 0.8 mg/dL (ref 0.7–1.3)
EGFR: >90 mL/min/{1.73_m2} (ref >90)
Glucose: 152 mg/dl — ABNORMAL HIGH (ref 70–140)
POTASSIUM: 4 meq/L (ref 3.5–5.1)
SODIUM: 136 meq/L (ref 136–145)
Total Bilirubin: 0.5 mg/dL (ref 0.20–1.20)
Total Protein: 6.5 g/dL (ref 6.4–8.3)

## 2016-02-12 MED ORDER — OSELTAMIVIR PHOSPHATE 75 MG PO CAPS: 75.0000 mg | ORAL_CAPSULE | Freq: Two times a day (BID) | ORAL | Status: DC

## 2016-02-12 MED ORDER — PANCRELIPASE (LIP-PROT-AMYL) 24000-76000 UNITS PO CPEP: 3.0000 | ORAL_CAPSULE | Freq: Three times a day (TID) | ORAL | Status: DC

## 2016-02-12 MED ORDER — LANREOTIDE ACETATE 120 MG/0.5ML ~~LOC~~ SOLN
120.0000 mg | Freq: Once | SUBCUTANEOUS | Status: AC
  Administered 2016-02-12: 120 mg via SUBCUTANEOUS
  Filled 2016-02-12: qty 120

## 2016-02-12 MED FILL — CREON DR 24,000 UNITS CAP: 24000-76000 | 30 days supply | Qty: 270 | Fill #0

## 2016-02-12 MED FILL — OSELTAMIVIR PHOS 75 MG CAP: 75 | 5 days supply | Qty: 10 | Fill #0

## 2016-02-12 NOTE — Progress Notes (Signed)
Hematology and Oncology Follow Up Visit  [name] [id number] 1961/09/23 56 y.o. 02/12/2016   Principle Diagnosis:   Metastatic neuroendocrine carcinoma  Von Hipple-Lindau Syndrome  Current Therapy:  Afinitor 5 mg by mouth daily  Somatuline 120mg  SQ q month     Interim History:  [name] is back for followup. He is doing fairly well. He does have some "flulike" symptoms. I'll put him on some Tamiflu.  He is tolerating Afinitor okay. I want to try to give him some Decadron mouth rinse. Thankfully, he has not had any mouth sores.  He's had no abdominal spasms. He does have the suprapubic catheter area and he just saw his urologist. Thankfully, he's not had any recent urinary tract infections.  His last chromogranin A level was 23 back in February.  He had a good appetite. His blood sugars have been a little on the high side.  He's had no bleeding.  He says he has a little better sensation in his hands. He had some problems at his next surgery area and he was told by the neurosurgeon that the nerves were stressed a little and that it will take several months before the neuropathy will improve.   Currently, his overall performance status is ECOG 1.   Medications:  Current outpatient prescriptions:  .  baclofen (LIORESAL) 20 MG tablet, Take 20-40 mg by mouth 4 (four) times daily. , Disp: , Rfl:  .  diazepam (VALIUM) 5 MG tablet, TAKE 1 TABLET BY MOUTH 3 TIMES DAILY FOR SPASMS/ANXIETY, Disp: 90 tablet, Rfl: 1 .  doxazosin (CARDURA) 1 MG tablet, Take 1 tablet (1 mg total) by mouth 2 (two) times daily. (Patient taking differently: Take 1 mg by mouth 2 (two) times daily as needed (blood pressure spikes.). ), Disp: 60 tablet, Rfl: 2 .  dronabinol (MARINOL) 5 MG capsule, TAKE 1 CAPSULE BY MOUTH TWICE DAILY WITH MEALS, Disp: 60 capsule, Rfl: 1 .  everolimus (AFINITOR) 10 MG tablet, Take 1 tablet (10 mg total) by mouth daily., Disp: 30 tablet, Rfl: 2 .  gabapentin (NEURONTIN)  300 MG capsule, Take 2 capsules (600 mg total) by mouth 3 (three) times daily., Disp: 180 capsule, Rfl: 6 .  HYDROcodone-acetaminophen (NORCO/VICODIN) 5-325 MG tablet, Take 1 tablet by mouth every 6 (six) hours as needed for moderate pain., Disp: 90 tablet, Rfl: 0 .  HYDROmorphone (DILAUDID) 4 MG tablet, Take 4 mg by mouth every 6 (six) hours as needed for severe pain., Disp: , Rfl:  .  insulin glargine (LANTUS) 100 UNIT/ML injection, Inject 31 Units into the skin daily before breakfast. , Disp: , Rfl:  .  insulin lispro (HUMALOG) 100 UNIT/ML injection, Inject 3-9 Units into the skin 3 (three) times daily before meals. SS, Disp: , Rfl:  .  midodrine (PROAMATINE) 5 MG tablet, TKAE 1/2 TABLET BY MOUTH TWICE A DAY AS NEEDED (Patient taking differently: TKAE 1/2 TABLET BY MOUTH TWICE A DAY AS NEEDED BLOOD PRESSURE.), Disp: 60 tablet, Rfl: 6 .  promethazine (PHENERGAN) 12.5 MG tablet, Take 12.5 mg by mouth every 6 (six) hours as needed for nausea. , Disp: , Rfl:  .  tiZANidine (ZANAFLEX) 4 MG tablet, TAKE 2 TABLETS BY MOUTH EVERY 6 HOURS AS NEEDED FOR PAIN, Disp: 30 tablet, Rfl: 3 .  traMADol (ULTRAM) 50 MG tablet, TAKE 1 TABLET BY MOUTH 3 TIMES DAILY, Disp: 90 tablet, Rfl: 2 .  oseltamivir (TAMIFLU) 75 MG capsule, Take 1 capsule (75 mg total) by mouth 2 (two)  times daily., Disp: 10 capsule, Rfl: 0 .  Pancrelipase, Lip-Prot-Amyl, 24000 units CPEP, Take 3 capsules (72,000 Units total) by mouth 3 (three) times daily with meals., Disp: 270 capsule, Rfl: 3  Allergies:  Allergies  Allergen Reactions  . Ciprocin-Fluocin-Procin [Fluocinolone Acetonide] Rash    Pt states this happened with IV Cipro. ABLE TO TAKE PO CIPRO.  . Other Rash    IV-CIPRO    Past Medical History, Surgical history, Social history, and Family History were reviewed and updated.  Review of Systems: As above  Physical Exam:  height is 6' (1.829 m) and weight is 163 lb (73.936 kg). His oral temperature is 97.6 F (36.4 C). His  blood pressure is 79/47 and his pulse is 76. His respiration is 18.   Well-developed and well-nourished gentleman. He is paralyzed from the waist down. His head exam shows no ocular or oral lesions. He has no palpable cervical or supraclavicular lymph nodes. His lungs are clear. Cardiac exam regular rate and rhythm with no murmurs, rubs or bruits. Abdomen is soft. Has good bowel sounds. There is no fluid wave. He has a well-healed laparotomy scar is. He has no obvious abdominal mass. There is no obvious liver or spleen tip. Back exam shows a laminectomy scar in the neck and upper thoracic spine. Extremities shows no swelling in his lower legs. He has no strength secondary to the paralysis. Neurological exam is nonfocal. Skin exam shows no rashes.  Lab Results  Component Value Date   WBC 5.7 02/12/2016   HGB 13.8 02/12/2016   HCT 42.1 02/12/2016   MCV 81* 02/12/2016   PLT 176 02/12/2016     Chemistry      Component Value Date/Time   NA 136 02/12/2016 1020   NA 139 12/02/2015 1107   NA 138 09/27/2015 0518   K 4.0 02/12/2016 1020   K 3.7 12/02/2015 1107   K 3.7 09/27/2015 0518   CL 97* 12/02/2015 1107   CL 102 09/27/2015 0518   CO2 27 02/12/2016 1020   CO2 30 12/02/2015 1107   CO2 29 09/27/2015 0518   BUN 19.2 02/12/2016 1020   BUN 19 12/02/2015 1107   BUN 10 09/27/2015 0518   CREATININE 0.8 02/12/2016 1020   CREATININE 0.9 12/02/2015 1107   CREATININE 0.75 09/27/2015 0518      Component Value Date/Time   CALCIUM 8.6 02/12/2016 1020   CALCIUM 8.8 12/02/2015 1107   CALCIUM 8.3* 09/27/2015 0518   ALKPHOS 141 02/12/2016 1020   ALKPHOS 123* 12/02/2015 1107   ALKPHOS 103 09/25/2015 0250   AST 29 02/12/2016 1020   AST 34 12/02/2015 1107   AST 31 09/25/2015 0250   ALT 28 02/12/2016 1020   ALT 25 12/02/2015 1107   ALT 20 09/25/2015 0250   BILITOT 0.50 02/12/2016 1020   BILITOT 0.60 12/02/2015 1107   BILITOT 0.5 09/25/2015 0250         Impression and Plan: [name] is  55 year old gentleman with von Hippel-Lindau syndrome. He has multiple neuroendocrine tumors. 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 He's had multiple spinal surgeries.  I Am. just glad that things are going well for him right now. He looks quite good.  He goes back up to the [place] in April. I will like to see him back before then. I want to get a set of scans on him.  I'm not sure if he has the flu. However, I will put him on some Tamiflu.  I'm just glad that his quality of life is doing well.  I spent about 40 minutes with he and his wife.   Volanda Napoleon, MD 3/9/20174:44 PM

## 2016-02-12 NOTE — Patient Instructions (Signed)
Lanreotide injection What is this medicine? LANREOTIDE (lan REE oh tide) is used to reduce blood levels of growth hormone in patients with a condition called acromegaly. It also works to slow or stop tumor growth in patients with gastroenteropancreatic neuroendocrine tumor (GEP-NET). This medicine may be used for other purposes; ask your health care provider or pharmacist if you have questions. What should I tell my health care provider before I take this medicine? They need to know if you have any of these conditions: -diabetes -gallbladder disease -heart disease -kidney disease -liver disease -an unusual or allergic reaction to lanreotide, other medicines, latex, foods, dyes, or preservatives -pregnant or trying to get pregnant -breast-feeding How should I use this medicine? This medicine is for injection under the skin. It is given by a health care professional in a hospital or clinic setting. Contact your pediatrician or health care professional regarding the use of this medicine in children. Special care may be needed. Overdosage: If you think you have taken too much of this medicine contact a poison control center or emergency room at once. NOTE: This medicine is only for you. Do not share this medicine with others. What if I miss a dose? It is important not to miss your dose. Call your doctor or health care professional if you are unable to keep an appointment. What may interact with this medicine? -bromocriptine -cyclosporine -medicines for diabetes, including insulin -medicines for heart disease or hypertension -quinidine This list may not describe all possible interactions. Give your health care provider a list of all the medicines, herbs, non-prescription drugs, or dietary supplements you use. Also tell them if you smoke, drink alcohol, or use illegal drugs. Some items may interact with your medicine. What should I watch for while using this medicine? Visit your doctor or  health care professional for regular checks on your progress. Your condition will be monitored carefully while you are receiving this medicine. This medicine may cause increases or decreases in blood sugar. Signs of high blood sugar include frequent urination, unusual thirst, flushed or dry skin, difficulty breathing, drowsiness, stomach ache, nausea, vomiting or dry mouth. Signs of low blood sugar include chills, cool, pale skin or cold sweats, drowsiness, extreme hunger, fast heartbeat, headache, nausea, nervousness or anxiety, shakiness, trembling, unsteadiness, tiredness, or weakness. Contact your doctor or health care professional right away if you experience any of these symptoms. What side effects may I notice from receiving this medicine? Side effects that you should report to your doctor or health care professional as soon as possible: -allergic reactions like skin rash, itching or hives, swelling of the face, lips, or tongue -changes in blood sugar -changes in heart rate -severe stomach pain Side effects that usually do not require medical attention (report to your doctor or health care professional if they continue or are bothersome): -diarrhea or constipation -gas or stomach pain -nausea, vomiting -pain, redness, swelling and irritation at site where injected This list may not describe all possible side effects. Call your doctor for medical advice about side effects. You may report side effects to FDA at 1-800-FDA-1088. Where should I keep my medicine? This drug is given in a hospital or clinic and will not be stored at home. NOTE: This sheet is a summary. It may not cover all possible information. If you have questions about this medicine, talk to your doctor, pharmacist, or health care provider.    2016, [employer]/Gold Standard. (2013-11-21 17:43:04)  

## 2016-02-13 MED FILL — HUMALOG 100 UNITS/ML KWIKPE: 100 | 60 days supply | Qty: 15 | Fill #0

## 2016-02-13 MED FILL — GABAPENTIN 300 MG CAPSULE: 300 | 30 days supply | Qty: 180 | Fill #1

## 2016-02-16 LAB — CHROMOGRANIN A: CHROMOGRAN A: 3 nmol/L (ref 0–5)

## 2016-02-16 LAB — CHROMOGRANIN A (PARALLEL TESTING): Chromogranin A: 17 ng/mL — ABNORMAL HIGH (ref <=15)

## 2016-02-20 ENCOUNTER — Other Ambulatory Visit: Payer: Self-pay | Admitting: Hematology & Oncology

## 2016-02-20 MED FILL — traMADol HCL 50 MG TABS: 50 | 30 days supply | Qty: 90 | Fill #0

## 2016-02-20 MED FILL — BD INSULIN SYR 0.5 ML 30GX1: 30G X 1/2" | 90 days supply | Qty: 100 | Fill #0

## 2016-02-20 MED FILL — LANTUS 100 UNITS/ML VIAL: 100 | 28 days supply | Qty: 10 | Fill #7

## 2016-02-24 ENCOUNTER — Telehealth: Payer: Self-pay | Admitting: *Deleted

## 2016-02-24 DIAGNOSIS — N3020 Other chronic cystitis without hematuria: Secondary | ICD-10-CM | POA: Diagnosis not present

## 2016-02-24 NOTE — Telephone Encounter (Signed)
Wendi Yasui ( Johns wife) called at [facility] stating that [name] is experiencing same skin breakdown on calf, sacrum and backside as he did the last time he was on Afinitor.  Patient also in general feels very bad.  When this happened last time patient ended up in hospital with high fever.  They called Dr. Diona Fanti and sent a urine for analysis.  Patient Blood sugar  265.  Spoke to Dr. Marin Olp he states to Stop Afinitor.  At 104 Wendi called back stating she realized patient slept with his legs criss crossed and she feels he had a sympathetic response and now everything is back to normal. Patient feeling much better.  Patient wants to keep things as is for now. Reported this to Dr. Marin Olp.  Ok to stay on Afinitor.

## 2016-02-26 MED FILL — ACCU-CHEK SMARTVIEW TEST ST: 25 days supply | Qty: 100 | Fill #8

## 2016-02-26 MED FILL — BACLOFEN 20 MG TABLET: 20 | 30 days supply | Qty: 120 | Fill #3

## 2016-03-02 ENCOUNTER — Other Ambulatory Visit: Payer: Self-pay | Admitting: Hematology & Oncology

## 2016-03-02 MED FILL — DRONABINOL 5 MG CAPSULE: 5 | 30 days supply | Qty: 60 | Fill #0

## 2016-03-08 ENCOUNTER — Other Ambulatory Visit: Payer: Self-pay | Admitting: Hematology & Oncology

## 2016-03-08 MED FILL — CREON DR 24,000 UNITS CAP: 24000-76000 | 30 days supply | Qty: 270 | Fill #1

## 2016-03-08 MED FILL — tiZANidine HCL 4 MG TABS: 4 | 4 days supply | Qty: 30 | Fill #0

## 2016-03-16 ENCOUNTER — Encounter (HOSPITAL_BASED_OUTPATIENT_CLINIC_OR_DEPARTMENT_OTHER): Payer: Self-pay

## 2016-03-16 ENCOUNTER — Ambulatory Visit: Payer: 59

## 2016-03-16 ENCOUNTER — Ambulatory Visit (HOSPITAL_BASED_OUTPATIENT_CLINIC_OR_DEPARTMENT_OTHER)
Admission: RE | Admit: 2016-03-16 | Discharge: 2016-03-16 | Disposition: A | Discharge status: Home / Self Care | Payer: 59 | Source: Ambulatory Visit | Attending: Hematology & Oncology | Admitting: Hematology & Oncology

## 2016-03-16 ENCOUNTER — Other Ambulatory Visit (HOSPITAL_BASED_OUTPATIENT_CLINIC_OR_DEPARTMENT_OTHER): Payer: 59

## 2016-03-16 ENCOUNTER — Ambulatory Visit (HOSPITAL_BASED_OUTPATIENT_CLINIC_OR_DEPARTMENT_OTHER): Payer: 59 | Admitting: Hematology & Oncology

## 2016-03-16 ENCOUNTER — Encounter: Payer: Self-pay | Admitting: Hematology & Oncology

## 2016-03-16 VITALS — BP 116/81 | HR 61 | Temp 97.3°F | Resp 16 | Ht 72.0 in | Wt 163.0 lb

## 2016-03-16 DIAGNOSIS — C7A8 Other malignant neuroendocrine tumors: Secondary | ICD-10-CM

## 2016-03-16 DIAGNOSIS — Z90410 Acquired total absence of pancreas: Secondary | ICD-10-CM | POA: Insufficient documentation

## 2016-03-16 DIAGNOSIS — C787 Secondary malignant neoplasm of liver and intrahepatic bile duct: Secondary | ICD-10-CM | POA: Insufficient documentation

## 2016-03-16 DIAGNOSIS — C759 Malignant neoplasm of endocrine gland, unspecified: Secondary | ICD-10-CM | POA: Diagnosis not present

## 2016-03-16 DIAGNOSIS — Z905 Acquired absence of kidney: Secondary | ICD-10-CM | POA: Insufficient documentation

## 2016-03-16 DIAGNOSIS — C7B8 Other secondary neuroendocrine tumors: Secondary | ICD-10-CM | POA: Diagnosis not present

## 2016-03-16 DIAGNOSIS — L270 Generalized skin eruption due to drugs and medicaments taken internally: Secondary | ICD-10-CM

## 2016-03-16 LAB — CMP (CANCER CENTER ONLY)
ALBUMIN: 2.9 g/dL — AB (ref 3.3–5.5)
ALT(SGPT): 37 U/L (ref 10–47)
AST: 41 U/L — ABNORMAL HIGH (ref 11–38)
Alkaline Phosphatase: 113 U/L — ABNORMAL HIGH (ref 26–84)
BUN, Bld: 14 mg/dL (ref 7–22)
CHLORIDE: 98 meq/L (ref 98–108)
CO2: 30 mEq/L (ref 18–33)
CREATININE: 0.7 mg/dL (ref 0.6–1.2)
Calcium: 8.9 mg/dL (ref 8.0–10.3)
Glucose, Bld: 109 mg/dL (ref 73–118)
POTASSIUM: 4.5 meq/L (ref 3.3–4.7)
Sodium: 139 mEq/L (ref 128–145)
TOTAL PROTEIN: 7.3 g/dL (ref 6.4–8.1)
Total Bilirubin: 0.6 mg/dl (ref 0.20–1.60)

## 2016-03-16 LAB — CBC WITH DIFFERENTIAL (CANCER CENTER ONLY)
BASO#: 0 10*3/uL (ref 0.0–0.2)
BASO%: 0.2 % (ref 0.0–2.0)
EOS ABS: 0.1 10*3/uL (ref 0.0–0.5)
EOS%: 1.3 % (ref 0.0–7.0)
HCT: 42.8 % (ref 38.7–49.9)
HEMOGLOBIN: 13.7 g/dL (ref 13.0–17.1)
LYMPH#: 1.4 10*3/uL (ref 0.9–3.3)
LYMPH%: 14.2 % (ref 14.0–48.0)
MCH: 26.6 pg — AB (ref 28.0–33.4)
MCHC: 32 g/dL (ref 32.0–35.9)
MCV: 83 fL (ref 82–98)
MONO#: 0.7 10*3/uL (ref 0.1–0.9)
MONO%: 6.8 % (ref 0.0–13.0)
NEUT%: 77.5 % (ref 40.0–80.0)
NEUTROS ABS: 7.6 10*3/uL — AB (ref 1.5–6.5)
Platelets: 266 10*3/uL (ref 145–400)
RBC: 5.15 10*6/uL (ref 4.20–5.70)
RDW: 15.4 % (ref 11.1–15.7)
WBC: 9.8 10*3/uL (ref 4.0–10.0)

## 2016-03-16 MED ORDER — IOPAMIDOL (ISOVUE-300) INJECTION 61%
100.0000 mL | Freq: Once | INTRAVENOUS | Status: AC | PRN
  Administered 2016-03-16: 100 mL via INTRAVENOUS

## 2016-03-16 MED ORDER — TRYPSIN-CASTOR OIL-PERU BALSAM EX OINT: 1.0000 "application " | TOPICAL_OINTMENT | CUTANEOUS | Status: DC | PRN

## 2016-03-16 MED ORDER — CAPECITABINE 500 MG PO TABS: ORAL_TABLET | ORAL | Status: DC

## 2016-03-16 MED ORDER — TEMOZOLOMIDE 140 MG PO CAPS: ORAL_CAPSULE | ORAL | Status: DC

## 2016-03-16 NOTE — Progress Notes (Signed)
Hematology and Oncology Follow Up Visit  [name] [id number] 1961-11-25 55 y.o. 03/16/2016   Principle Diagnosis:   Metastatic neuroendocrine carcinoma  Von Hipple-Lindau Syndrome  Current Therapy:  Temodar/Xeloda - start 03/17/2016  Somatuline 120mg  SQ q month     Interim History:  [name] is back for followup. Unfortunately, he is progressing. We did go ahead and get a no CT scan on him. This was done today. He has progression in his liver.  He already has had maximal intrahepatic therapy.  His blood sugars have been more variate area and he had quite low blood sugar today.  He has a goes back up to [facility] at the end of the month. He will get evaluated by the neurosurgeons up there.   He's had some issues with his suprapubic catheter.   His appetite seems be doing okay. His had no nausea or vomiting.   His last chromogranin A level back in March was 17.   He's had no rashes. He was on Afinitor. He has stopped this. After about 3 weeks, he developed a rash with the Afinitor.   He has had no cough. He has had no shortness of breath. He has had no nausea or vomiting.  As always, his wife is doing great job with him.  Currently, his overall performance status is ECOG 1.   Medications:  Current outpatient prescriptions:  .  baclofen (LIORESAL) 20 MG tablet, Take 20-40 mg by mouth 4 (four) times daily. , Disp: , Rfl:  .  diazepam (VALIUM) 5 MG tablet, TAKE 1 TABLET BY MOUTH 3 TIMES DAILY FOR SPASMS/ANXIETY, Disp: 90 tablet, Rfl: 1 .  doxazosin (CARDURA) 1 MG tablet, Take 1 tablet (1 mg total) by mouth 2 (two) times daily. (Patient taking differently: Take 1 mg by mouth 2 (two) times daily as needed (blood pressure spikes.). ), Disp: 60 tablet, Rfl: 2 .  dronabinol (MARINOL) 5 MG capsule, TAKE 1 CAPSULE BY MOUTH TWICE DAILY WITH MEALS, Disp: 60 capsule, Rfl: 1 .  everolimus (AFINITOR) 10 MG tablet, Take 1 tablet (10 mg total) by mouth daily., Disp: 30 tablet, Rfl:  2 .  gabapentin (NEURONTIN) 300 MG capsule, Take 2 capsules (600 mg total) by mouth 3 (three) times daily., Disp: 180 capsule, Rfl: 6 .  HYDROcodone-acetaminophen (NORCO/VICODIN) 5-325 MG tablet, Take 1 tablet by mouth every 6 (six) hours as needed for moderate pain., Disp: 90 tablet, Rfl: 0 .  insulin glargine (LANTUS) 100 UNIT/ML injection, Inject 31 Units into the skin daily before breakfast. , Disp: , Rfl:  .  insulin lispro (HUMALOG) 100 UNIT/ML injection, Inject 3-9 Units into the skin 3 (three) times daily before meals. SS, Disp: , Rfl:  .  midodrine (PROAMATINE) 5 MG tablet, TKAE 1/2 TABLET BY MOUTH TWICE A DAY AS NEEDED (Patient taking differently: TKAE 1/2 TABLET BY MOUTH TWICE A DAY AS NEEDED BLOOD PRESSURE.), Disp: 60 tablet, Rfl: 6 .  Pancrelipase, Lip-Prot-Amyl, 24000 units CPEP, Take 3 capsules (72,000 Units total) by mouth 3 (three) times daily with meals., Disp: 270 capsule, Rfl: 3 .  promethazine (PHENERGAN) 12.5 MG tablet, Take 12.5 mg by mouth every 6 (six) hours as needed for nausea. , Disp: , Rfl:  .  tiZANidine (ZANAFLEX) 4 MG tablet, TAKE 2 TABLETS BY MOUTH EVERY 6 HOURS AS NEEDED FOR PAIN, Disp: 30 tablet, Rfl: 3 .  traMADol (ULTRAM) 50 MG tablet, TAKE 1 TABLET BY MOUTH THREE TIMES DAILY, Disp: 90 tablet, Rfl: 2 .  capecitabine (XELODA) 500 MG tablet, Take 2 tablets TWICE a day with food for 14 days on and 14 days off., Disp: 56 tablet, Rfl: 4 .  HYDROmorphone (DILAUDID) 4 MG tablet, Take 4 mg by mouth every 6 (six) hours as needed for severe pain., Disp: , Rfl:  .  temozolomide (TEMODAR) 140 MG capsule, TAKE 2 CAPSULES A DAY FOR 4 DAYS. START ON DAY 10 OF EACH 28 DAY CYCLE., Disp: 8 capsule, Rfl: 4 .  trypsin-balsam-castor oil (XENADERM) ointment, Apply 1 application topically as needed., Disp: 60 g, Rfl: 4  Allergies:  Allergies  Allergen Reactions  . Ciprocin-Fluocin-Procin [Fluocinolone Acetonide] Rash    Pt states this happened with IV Cipro. ABLE TO TAKE PO CIPRO.   . Other Rash    IV-CIPRO    Past Medical History, Surgical history, Social history, and Family History were reviewed and updated.  Review of Systems: As above  Physical Exam:  height is 6' (1.829 m) and weight is 163 lb (73.936 kg). His oral temperature is 97.3 F (36.3 C). His blood pressure is 116/81 and his pulse is 61. His respiration is 16.   Well-developed and well-nourished gentleman. He is paralyzed from the waist down. His head exam shows no ocular or oral lesions. He has no palpable cervical or supraclavicular lymph nodes. His lungs are clear. Cardiac exam regular rate and rhythm with no murmurs, rubs or bruits. Abdomen is soft. Has good bowel sounds. There is no fluid wave. He has a well-healed laparotomy scar is. He has no obvious abdominal mass. There is no obvious liver or spleen tip. Back exam shows a laminectomy scar in the neck and upper thoracic spine. Extremities shows no swelling in his lower legs. He has no strength secondary to the paralysis. Neurological exam is nonfocal. Skin exam shows no rashes.  Lab Results  Component Value Date   WBC 9.8 03/16/2016   HGB 13.7 03/16/2016   HCT 42.8 03/16/2016   MCV 83 03/16/2016   PLT 266 03/16/2016     Chemistry      Component Value Date/Time   NA 139 03/16/2016 0914   NA 136 02/12/2016 1020   NA 138 09/27/2015 0518   K 4.5 03/16/2016 0914   K 4.0 02/12/2016 1020   K 3.7 09/27/2015 0518   CL 98 03/16/2016 0914   CL 102 09/27/2015 0518   CO2 30 03/16/2016 0914   CO2 27 02/12/2016 1020   CO2 29 09/27/2015 0518   BUN 14 03/16/2016 0914   BUN 19.2 02/12/2016 1020   BUN 10 09/27/2015 0518   CREATININE 0.7 03/16/2016 0914   CREATININE 0.8 02/12/2016 1020   CREATININE 0.75 09/27/2015 0518      Component Value Date/Time   CALCIUM 8.9 03/16/2016 0914   CALCIUM 8.6 02/12/2016 1020   CALCIUM 8.3* 09/27/2015 0518   ALKPHOS 113* 03/16/2016 0914   ALKPHOS 141 02/12/2016 1020   ALKPHOS 103 09/25/2015 0250   AST 41*  03/16/2016 0914   AST 29 02/12/2016 1020   AST 31 09/25/2015 0250   ALT 37 03/16/2016 0914   ALT 28 02/12/2016 1020   ALT 20 09/25/2015 0250   BILITOT 0.60 03/16/2016 0914   BILITOT 0.50 02/12/2016 1020   BILITOT 0.5 09/25/2015 0250         Impression and Plan: [name] is 55 year old gentleman with von Hippel-Lindau syndrome. He has multiple neuroendocrine tumors. 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 He's had multiple spinal surgeries.  Unfortunately, it looks like he is progressing again. The progression is in his liver. He just is not candidate for any further intrahepatic therapy.  I think her best option would be to get him back on Temodar/Xeloda. He has been on this before. Upon his been a good 3 years that has been on this. It worked very nicely for him. I think toxicity was reasonable.  I will go ahead and give him 2 cycles of therapy and then we will repeat our CT scan.  We will have to see how his chromogranin A level is.  I spent about 40 minutes with he and his wife.   Volanda Napoleon, MD 4/11/20171:01 PM

## 2016-03-17 ENCOUNTER — Other Ambulatory Visit: Payer: Self-pay | Admitting: *Deleted

## 2016-03-17 MED ORDER — VENELEX EX OINT: TOPICAL_OINTMENT | CUTANEOUS | Status: DC

## 2016-03-17 MED FILL — CAPECITABINE 500 MG TABLET: 500 | 28 days supply | Qty: 56 | Fill #0

## 2016-03-17 MED FILL — VENELEX OINTMENT: 30 days supply | Qty: 60 | Fill #0

## 2016-03-17 MED FILL — TEMOZOLOMIDE 140 MG CAPSULE: 140 | 28 days supply | Qty: 8 | Fill #0

## 2016-03-17 MED FILL — OXYBUTYNIN 5 MG TABLET: 5 | 30 days supply | Qty: 60 | Fill #1

## 2016-03-18 LAB — CHROMOGRANIN A: Chromogranin A: 3 nmol/L (ref 0–5)

## 2016-03-18 MED FILL — GABAPENTIN 300 MG CAPSULE: 300 | 30 days supply | Qty: 180 | Fill #2

## 2016-03-18 MED FILL — traMADol HCL 50 MG TABS: 50 | 30 days supply | Qty: 90 | Fill #1

## 2016-03-18 MED FILL — LANTUS 100 UNITS/ML VIAL: 100 | 28 days supply | Qty: 10 | Fill #8

## 2016-04-05 ENCOUNTER — Other Ambulatory Visit: Payer: Self-pay | Admitting: Hematology & Oncology

## 2016-04-05 ENCOUNTER — Ambulatory Visit (HOSPITAL_BASED_OUTPATIENT_CLINIC_OR_DEPARTMENT_OTHER): Payer: 59

## 2016-04-05 ENCOUNTER — Telehealth: Payer: Self-pay | Admitting: *Deleted

## 2016-04-05 ENCOUNTER — Other Ambulatory Visit: Payer: Self-pay | Admitting: *Deleted

## 2016-04-05 ENCOUNTER — Telehealth: Payer: Self-pay | Admitting: Hematology & Oncology

## 2016-04-05 DIAGNOSIS — C7B8 Other secondary neuroendocrine tumors: Secondary | ICD-10-CM

## 2016-04-05 DIAGNOSIS — C7A8 Other malignant neuroendocrine tumors: Secondary | ICD-10-CM | POA: Diagnosis not present

## 2016-04-05 DIAGNOSIS — R319 Hematuria, unspecified: Principal | ICD-10-CM

## 2016-04-05 DIAGNOSIS — N390 Urinary tract infection, site not specified: Secondary | ICD-10-CM

## 2016-04-05 LAB — URINALYSIS, MICROSCOPIC - CHCC
Bilirubin (Urine): NEGATIVE
GLUCOSE UR CHCC: NEGATIVE mg/dL
KETONES: NEGATIVE mg/dL
Nitrite: POSITIVE
Protein: 100 mg/dL
SPECIFIC GRAVITY, URINE: 1.015 (ref 1.003–1.035)
UROBILINOGEN UR: 0.2 mg/dL (ref 0.2–1)
pH: 6.5 (ref 4.6–8.0)

## 2016-04-05 LAB — CBC WITH DIFFERENTIAL/PLATELET
BASO%: 0.2 % (ref 0.0–2.0)
Basophils Absolute: 0 10*3/uL (ref 0.0–0.1)
EOS%: 2.3 % (ref 0.0–7.0)
Eosinophils Absolute: 0.2 10*3/uL (ref 0.0–0.5)
HEMATOCRIT: 42.5 % (ref 38.4–49.9)
HEMOGLOBIN: 13.9 g/dL (ref 13.0–17.1)
LYMPH#: 2 10*3/uL (ref 0.9–3.3)
LYMPH%: 24.3 % (ref 14.0–49.0)
MCH: 27.4 pg (ref 27.2–33.4)
MCHC: 32.7 g/dL (ref 32.0–36.0)
MCV: 83.8 fL (ref 79.3–98.0)
MONO#: 0.9 10*3/uL (ref 0.1–0.9)
MONO%: 10.6 % (ref 0.0–14.0)
NEUT#: 5.2 10*3/uL (ref 1.5–6.5)
NEUT%: 62.6 % (ref 39.0–75.0)
Platelets: 293 10*3/uL (ref 140–400)
RBC: 5.07 10*6/uL (ref 4.20–5.82)
RDW: 18.1 % — AB (ref 11.0–14.6)
WBC: 8.4 10*3/uL (ref 4.0–10.3)

## 2016-04-05 LAB — COMPREHENSIVE METABOLIC PANEL
ALBUMIN: 3.2 g/dL — AB (ref 3.5–5.0)
ALT: 26 U/L (ref 0–55)
AST: 31 U/L (ref 5–34)
Alkaline Phosphatase: 124 U/L (ref 40–150)
Anion Gap: 9 mEq/L (ref 3–11)
BUN: 18.7 mg/dL (ref 7.0–26.0)
CALCIUM: 8.9 mg/dL (ref 8.4–10.4)
CHLORIDE: 99 meq/L (ref 98–109)
CO2: 26 mEq/L (ref 22–29)
CREATININE: 0.9 mg/dL (ref 0.7–1.3)
EGFR: >90 mL/min/{1.73_m2} (ref >90)
GLUCOSE: 206 mg/dL — AB (ref 70–140)
Potassium: 4 mEq/L (ref 3.5–5.1)
SODIUM: 134 meq/L — AB (ref 136–145)
Total Bilirubin: 0.76 mg/dL (ref 0.20–1.20)
Total Protein: 7.2 g/dL (ref 6.4–8.3)

## 2016-04-05 MED ORDER — AMOXICILLIN-POT CLAVULANATE 875-125 MG PO TABS: 1.0000 | ORAL_TABLET | Freq: Two times a day (BID) | ORAL | Status: DC

## 2016-04-05 MED FILL — DRONABINOL 5 MG CAPSULE: 5 | 30 days supply | Qty: 60 | Fill #1

## 2016-04-05 MED FILL — AMOX-CLAV 875-125 MG TABLET: 875-125 | 7 days supply | Qty: 14 | Fill #0

## 2016-04-05 NOTE — Telephone Encounter (Signed)
Wife of patient calling to notify office that patient has significant hematuria after completing a cycle of Temodar and Xeloda. He also has been having high blood sugars but denies fever or any other sick symptoms. She would like to know if this could be a side effect of the chemo, or she should call urology.  Spoke with Dr Marin Olp who wants patient to have blood counts checked. He can be scheduled at the [facility] office to help patient not have to travel as far.   Wife is aware of plan. Transferred her to the schedule to schedule lab appointment in [place].

## 2016-04-05 NOTE — Telephone Encounter (Signed)
I looked a message on his intention machine. I told him that look like he had a urinary tract infection. Hopefully a culture was sent off.  It is very difficult to treat his urinary tract infection with oral antibiotics. He's had several infections. 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  At least I will all her some Augmentin for him. This hopefully will be oh to help. If not, he probably will need some home IV antibiotics.  We will have to call him tomorrow to see how he is doing.  Laurey Arrow

## 2016-04-06 MED FILL — ACCU-CHEK SMARTVIEW TEST ST: 25 days supply | Qty: 100 | Fill #9

## 2016-04-07 ENCOUNTER — Encounter: Payer: Self-pay | Admitting: *Deleted

## 2016-04-07 LAB — URINE CULTURE

## 2016-04-09 MED FILL — CREON DR 24,000 UNITS CAP: 24000-76000 | 30 days supply | Qty: 270 | Fill #2

## 2016-04-12 MED FILL — MIDODRINE HCL 5 MG TABLET: 5 | 60 days supply | Qty: 60 | Fill #2

## 2016-04-14 ENCOUNTER — Encounter: Payer: Self-pay | Admitting: Hematology & Oncology

## 2016-04-14 ENCOUNTER — Ambulatory Visit (HOSPITAL_BASED_OUTPATIENT_CLINIC_OR_DEPARTMENT_OTHER): Payer: 59

## 2016-04-14 ENCOUNTER — Other Ambulatory Visit (HOSPITAL_BASED_OUTPATIENT_CLINIC_OR_DEPARTMENT_OTHER): Payer: 59

## 2016-04-14 ENCOUNTER — Ambulatory Visit (HOSPITAL_BASED_OUTPATIENT_CLINIC_OR_DEPARTMENT_OTHER): Payer: 59 | Admitting: Hematology & Oncology

## 2016-04-14 VITALS — BP 98/50 | HR 105 | Temp 97.6°F | Resp 16 | Ht 72.0 in

## 2016-04-14 DIAGNOSIS — L270 Generalized skin eruption due to drugs and medicaments taken internally: Secondary | ICD-10-CM

## 2016-04-14 DIAGNOSIS — C7A8 Other malignant neuroendocrine tumors: Secondary | ICD-10-CM

## 2016-04-14 DIAGNOSIS — C7B8 Other secondary neuroendocrine tumors: Secondary | ICD-10-CM | POA: Diagnosis not present

## 2016-04-14 LAB — CMP (CANCER CENTER ONLY)
ALBUMIN: 2.8 g/dL — AB (ref 3.3–5.5)
ALT(SGPT): 25 U/L (ref 10–47)
AST: 34 U/L (ref 11–38)
Alkaline Phosphatase: 104 U/L — ABNORMAL HIGH (ref 26–84)
BUN, Bld: 16 mg/dL (ref 7–22)
CALCIUM: 8.9 mg/dL (ref 8.0–10.3)
CHLORIDE: 96 meq/L — AB (ref 98–108)
CO2: 24 meq/L (ref 18–33)
Creat: 0.9 mg/dl (ref 0.6–1.2)
Glucose, Bld: 236 mg/dL — ABNORMAL HIGH (ref 73–118)
POTASSIUM: 3.7 meq/L (ref 3.3–4.7)
Sodium: 133 mEq/L (ref 128–145)
Total Bilirubin: 0.7 mg/dl (ref 0.20–1.60)
Total Protein: 6.8 g/dL (ref 6.4–8.1)

## 2016-04-14 LAB — CBC WITH DIFFERENTIAL (CANCER CENTER ONLY)
BASO#: 0 10*3/uL (ref 0.0–0.2)
BASO%: 0.3 % (ref 0.0–2.0)
EOS ABS: 0.1 10*3/uL (ref 0.0–0.5)
EOS%: 1.2 % (ref 0.0–7.0)
HEMATOCRIT: 39.1 % (ref 38.7–49.9)
HEMOGLOBIN: 12.7 g/dL — AB (ref 13.0–17.1)
LYMPH#: 1.5 10*3/uL (ref 0.9–3.3)
LYMPH%: 15.9 % (ref 14.0–48.0)
MCH: 28 pg (ref 28.0–33.4)
MCHC: 32.5 g/dL (ref 32.0–35.9)
MCV: 86 fL (ref 82–98)
MONO#: 0.9 10*3/uL (ref 0.1–0.9)
MONO%: 9 % (ref 0.0–13.0)
NEUT%: 73.6 % (ref 40.0–80.0)
NEUTROS ABS: 7.1 10*3/uL — AB (ref 1.5–6.5)
Platelets: 291 10*3/uL (ref 145–400)
RBC: 4.54 10*6/uL (ref 4.20–5.70)
RDW: 18.9 % — ABNORMAL HIGH (ref 11.1–15.7)
WBC: 9.7 10*3/uL (ref 4.0–10.0)

## 2016-04-14 LAB — LACTATE DEHYDROGENASE: LDH: 137 U/L (ref 125–245)

## 2016-04-14 MED ORDER — LANREOTIDE ACETATE 120 MG/0.5ML ~~LOC~~ SOLN
120.0000 mg | Freq: Once | SUBCUTANEOUS | Status: AC
  Administered 2016-04-14: 120 mg via SUBCUTANEOUS
  Filled 2016-04-14: qty 120

## 2016-04-14 NOTE — Patient Instructions (Signed)
Lanreotide injection What is this medicine? LANREOTIDE (lan REE oh tide) is used to reduce blood levels of growth hormone in patients with a condition called acromegaly. It also works to slow or stop tumor growth in patients with gastroenteropancreatic neuroendocrine tumor (GEP-NET). This medicine may be used for other purposes; ask your health care provider or pharmacist if you have questions. What should I tell my health care provider before I take this medicine? They need to know if you have any of these conditions: -diabetes -gallbladder disease -heart disease -kidney disease -liver disease -an unusual or allergic reaction to lanreotide, other medicines, latex, foods, dyes, or preservatives -pregnant or trying to get pregnant -breast-feeding How should I use this medicine? This medicine is for injection under the skin. It is given by a health care professional in a hospital or clinic setting. Contact your pediatrician or health care professional regarding the use of this medicine in children. Special care may be needed. Overdosage: If you think you have taken too much of this medicine contact a poison control center or emergency room at once. NOTE: This medicine is only for you. Do not share this medicine with others. What if I miss a dose? It is important not to miss your dose. Call your doctor or health care professional if you are unable to keep an appointment. What may interact with this medicine? -bromocriptine -cyclosporine -medicines for diabetes, including insulin -medicines for heart disease or hypertension -quinidine This list may not describe all possible interactions. Give your health care provider a list of all the medicines, herbs, non-prescription drugs, or dietary supplements you use. Also tell them if you smoke, drink alcohol, or use illegal drugs. Some items may interact with your medicine. What should I watch for while using this medicine? Visit your doctor or  health care professional for regular checks on your progress. Your condition will be monitored carefully while you are receiving this medicine. This medicine may cause increases or decreases in blood sugar. Signs of high blood sugar include frequent urination, unusual thirst, flushed or dry skin, difficulty breathing, drowsiness, stomach ache, nausea, vomiting or dry mouth. Signs of low blood sugar include chills, cool, pale skin or cold sweats, drowsiness, extreme hunger, fast heartbeat, headache, nausea, nervousness or anxiety, shakiness, trembling, unsteadiness, tiredness, or weakness. Contact your doctor or health care professional right away if you experience any of these symptoms. What side effects may I notice from receiving this medicine? Side effects that you should report to your doctor or health care professional as soon as possible: -allergic reactions like skin rash, itching or hives, swelling of the face, lips, or tongue -changes in blood sugar -changes in heart rate -severe stomach pain Side effects that usually do not require medical attention (report to your doctor or health care professional if they continue or are bothersome): -diarrhea or constipation -gas or stomach pain -nausea, vomiting -pain, redness, swelling and irritation at site where injected This list may not describe all possible side effects. Call your doctor for medical advice about side effects. You may report side effects to FDA at 1-800-FDA-1088. Where should I keep my medicine? This drug is given in a hospital or clinic and will not be stored at home. NOTE: This sheet is a summary. It may not cover all possible information. If you have questions about this medicine, talk to your doctor, pharmacist, or health care provider.    2016, Elsevier/Gold Standard. (2013-11-21 17:43:04)

## 2016-04-14 NOTE — Progress Notes (Signed)
Hematology and Oncology Follow Up Visit  [name] [id number] May 08, 1961 55 y.o. 04/14/2016   Principle Diagnosis:   Metastatic neuroendocrine carcinoma  Von Hipple-Lindau Syndrome  Current Therapy:  Temodar/Xeloda - start 03/17/2016 - s/p c#1  Somatuline 120mg  SQ q month     Interim History:  [name] is back for followup. He actually looks quite good. He was up at the [facility] couple weeks ago. They reevaluated his spinal and brain metastasis. Everything really looked pretty stable. They do not have to go up for another year.  He had an episode of hematuria a couple weeks ago. Again, morbid about him having a resistant urinary tract infection. Thankfully, the hematuria resolved on its own.  He is doing well with the Temodar/Xeloda combination. He's had 1 cycle to date. He has had no problems with nausea or vomiting. He's had no cough. Has had no mouth sores.  His blood sugars are on the high side. It typically do get a little the higher side.  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 He developed this after he had surgery at the [place]. He is taking gabapentin for this. He is also on Zanaflex.  He's had no issues with diarrhea.  His last chromogranin A level was down to 17.  Currently, his overall performance status is ECOG 1.   Medications:  Current outpatient prescriptions:  .  baclofen (LIORESAL) 20 MG tablet, Take 20-40 mg by mouth 4 (four) times daily. , Disp: , Rfl:  .  Balsam [place]-Castor Oil (VENELEX) OINT, Apply topically as needed, Disp: 60 g, Rfl: 4 .  capecitabine (XELODA) 500 MG tablet, Take 2 tablets TWICE a day with food for 14 days on and 14 days off., Disp: 56 tablet, Rfl: 4 .  diazepam (VALIUM) 5 MG tablet, TAKE 1 TABLET BY MOUTH 3 TIMES DAILY FOR SPASMS/ANXIETY, Disp: 90 tablet, Rfl: 1 .  doxazosin (CARDURA) 1 MG tablet, Take 1 tablet (1 mg total) by mouth 2 (two) times daily. (Patient taking differently: Take 1 mg by mouth 2 (two) times daily  as needed (blood pressure spikes.). ), Disp: 60 tablet, Rfl: 2 .  dronabinol (MARINOL) 5 MG capsule, TAKE 1 CAPSULE BY MOUTH TWICE DAILY WITH MEALS, Disp: 60 capsule, Rfl: 1 .  everolimus (AFINITOR) 10 MG tablet, Take 1 tablet (10 mg total) by mouth daily., Disp: 30 tablet, Rfl: 2 .  gabapentin (NEURONTIN) 300 MG capsule, Take 2 capsules (600 mg total) by mouth 3 (three) times daily., Disp: 180 capsule, Rfl: 6 .  HYDROcodone-acetaminophen (NORCO/VICODIN) 5-325 MG tablet, Take 1 tablet by mouth every 6 (six) hours as needed for moderate pain., Disp: 90 tablet, Rfl: 0 .  HYDROmorphone (DILAUDID) 4 MG tablet, Take 4 mg by mouth every 6 (six) hours as needed for severe pain., Disp: , Rfl:  .  insulin glargine (LANTUS) 100 UNIT/ML injection, Inject 31 Units into the skin daily before breakfast. , Disp: , Rfl:  .  insulin lispro (HUMALOG) 100 UNIT/ML injection, Inject 3-9 Units into the skin 3 (three) times daily before meals. SS, Disp: , Rfl:  .  midodrine (PROAMATINE) 5 MG tablet, TKAE 1/2 TABLET BY MOUTH TWICE A DAY AS NEEDED (Patient taking differently: TKAE 1/2 TABLET BY MOUTH TWICE A DAY AS NEEDED BLOOD PRESSURE.), Disp: 60 tablet, Rfl: 6 .  Pancrelipase, Lip-Prot-Amyl, 24000 units CPEP, Take 3 capsules (72,000 Units total) by mouth 3 (three) times daily with meals., Disp: 270 capsule, Rfl: 3 .  promethazine (PHENERGAN)  12.5 MG tablet, Take 12.5 mg by mouth every 6 (six) hours as needed for nausea. , Disp: , Rfl:  .  temozolomide (TEMODAR) 140 MG capsule, TAKE 2 CAPSULES A DAY FOR 4 DAYS. START ON DAY 10 OF EACH 28 DAY CYCLE., Disp: 8 capsule, Rfl: 4 .  tiZANidine (ZANAFLEX) 4 MG tablet, TAKE 2 TABLETS BY MOUTH EVERY 6 HOURS AS NEEDED FOR PAIN, Disp: 30 tablet, Rfl: 3 .  traMADol (ULTRAM) 50 MG tablet, TAKE 1 TABLET BY MOUTH THREE TIMES DAILY, Disp: 90 tablet, Rfl: 2 No current facility-administered medications for this visit.  Facility-Administered Medications Ordered in Other Visits:  .   lanreotide acetate (SOMATULINE DEPOT) injection 120 mg, 120 mg, Subcutaneous, Once, Volanda Napoleon, MD  Allergies:  Allergies  Allergen Reactions  . Ciprocin-Fluocin-Procin [Fluocinolone Acetonide] Rash    Pt states this happened with IV Cipro. ABLE TO TAKE PO CIPRO.  . Other Rash    IV-CIPRO    Past Medical History, Surgical history, Social history, and Family History were reviewed and updated.  Review of Systems: As above  Physical Exam:  height is 6' (1.829 m). His oral temperature is 97.6 F (36.4 C). His blood pressure is 98/50 and his pulse is 105. His respiration is 16.   Well-developed and well-nourished gentleman. He is paralyzed from the waist down. His head exam shows no ocular or oral lesions. He has no palpable cervical or supraclavicular lymph nodes. His lungs are clear. Cardiac exam regular rate and rhythm with no murmurs, rubs or bruits. Abdomen is soft. Has good bowel sounds. There is no fluid wave. He has a well-healed laparotomy scar is. He has no obvious abdominal mass. There is no obvious liver or spleen tip. Back exam shows a laminectomy scar in the neck and upper thoracic spine. Extremities shows no swelling in his lower legs. He has no strength secondary to the paralysis. Neurological exam is nonfocal. Skin exam shows no rashes.  Lab Results  Component Value Date   WBC 9.7 04/14/2016   HGB 12.7* 04/14/2016   HCT 39.1 04/14/2016   MCV 86 04/14/2016   PLT 291 04/14/2016     Chemistry      Component Value Date/Time   NA 133 04/14/2016 1124   NA 134* 04/05/2016 1611   NA 138 09/27/2015 0518   K 3.7 04/14/2016 1124   K 4.0 04/05/2016 1611   K 3.7 09/27/2015 0518   CL 96* 04/14/2016 1124   CL 102 09/27/2015 0518   CO2 24 04/14/2016 1124   CO2 26 04/05/2016 1611   CO2 29 09/27/2015 0518   BUN 16 04/14/2016 1124   BUN 18.7 04/05/2016 1611   BUN 10 09/27/2015 0518   CREATININE 0.9 04/14/2016 1124   CREATININE 0.9 04/05/2016 1611   CREATININE 0.75  09/27/2015 0518      Component Value Date/Time   CALCIUM 8.9 04/14/2016 1124   CALCIUM 8.9 04/05/2016 1611   CALCIUM 8.3* 09/27/2015 0518   ALKPHOS 104* 04/14/2016 1124   ALKPHOS 124 04/05/2016 1611   ALKPHOS 103 09/25/2015 0250   AST 34 04/14/2016 1124   AST 31 04/05/2016 1611   AST 31 09/25/2015 0250   ALT 25 04/14/2016 1124   ALT 26 04/05/2016 1611   ALT 20 09/25/2015 0250   BILITOT 0.70 04/14/2016 1124   BILITOT 0.76 04/05/2016 1611   BILITOT 0.5 09/25/2015 0250         Impression and Plan: [name] is 55 year old gentleman with von Hippel-Lindau  syndrome. He has multiple neuroendocrine tumors. 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 He's had multiple spinal surgeries.  I think he is doing well from my point of view. It is nice of the chromogranin A is coming down.  I'm more happy that he is tolerating treatment without a lot of side effects.  We'll go ahead and plan to get him back in another month or so. We will get another scan on him when we see him. This will give [place] a good idea as to how well he is doing.   Hopefully, urinary tract infections will not be a problem. He does see urology.   I spent about 40 minutes with he and his wife.   Volanda Napoleon, MD 5/10/201712:52 PM

## 2016-04-15 ENCOUNTER — Other Ambulatory Visit: Payer: Self-pay | Admitting: *Deleted

## 2016-04-15 LAB — PREALBUMIN: PREALBUMIN: 13 mg/dL (ref 10–36)

## 2016-04-15 LAB — CHROMOGRANIN A: CHROMOGRAN A: 3 nmol/L (ref 0–5)

## 2016-04-15 MED ORDER — BACLOFEN 20 MG PO TABS: 20.0000 mg | ORAL_TABLET | Freq: Four times a day (QID) | ORAL | Status: DC

## 2016-04-15 MED FILL — GABAPENTIN 300 MG CAPSULE: 300 | 30 days supply | Qty: 180 | Fill #3

## 2016-04-15 MED FILL — VENELEX OINTMENT: 30 days supply | Qty: 60 | Fill #1

## 2016-04-15 MED FILL — CAPECITABINE 500 MG TABLET: 500 | 28 days supply | Qty: 56 | Fill #1

## 2016-04-15 MED FILL — TEMOZOLOMIDE 140 MG CAPSULE: 140 | 28 days supply | Qty: 8 | Fill #1

## 2016-04-15 MED FILL — BACLOFEN 20 MG TABLET: 20 | 15 days supply | Qty: 120 | Fill #0

## 2016-04-25 MED FILL — LANTUS 100 UNITS/ML VIAL: 100 | 28 days supply | Qty: 10 | Fill #9

## 2016-04-25 MED FILL — traMADol HCL 50 MG TABS: 50 | 30 days supply | Qty: 90 | Fill #2

## 2016-05-10 MED FILL — ACCU-CHEK SMARTVIEW TEST ST: 25 days supply | Qty: 100 | Fill #0

## 2016-05-10 MED FILL — OXYBUTYNIN 5 MG TABLET: 5 | 30 days supply | Qty: 60 | Fill #2

## 2016-05-10 MED FILL — CREON DR 24,000 UNITS CAP: 24000-76000 | 30 days supply | Qty: 270 | Fill #3

## 2016-05-11 ENCOUNTER — Other Ambulatory Visit: Payer: Self-pay | Admitting: Hematology & Oncology

## 2016-05-11 MED FILL — DRONABINOL 5 MG CAPSULE: 5 | 30 days supply | Qty: 60 | Fill #0

## 2016-05-12 DIAGNOSIS — E109 Type 1 diabetes mellitus without complications: Secondary | ICD-10-CM | POA: Diagnosis not present

## 2016-05-13 MED FILL — CAPECITABINE 500 MG TABLET: 500 | 28 days supply | Qty: 56 | Fill #2

## 2016-05-18 ENCOUNTER — Ambulatory Visit (HOSPITAL_BASED_OUTPATIENT_CLINIC_OR_DEPARTMENT_OTHER): Payer: 59

## 2016-05-18 ENCOUNTER — Ambulatory Visit (HOSPITAL_BASED_OUTPATIENT_CLINIC_OR_DEPARTMENT_OTHER)
Admission: RE | Admit: 2016-05-18 | Discharge: 2016-05-18 | Disposition: A | Discharge status: Home / Self Care | Payer: 59 | Source: Ambulatory Visit | Attending: Hematology & Oncology | Admitting: Hematology & Oncology

## 2016-05-18 ENCOUNTER — Encounter: Payer: Self-pay | Admitting: Hematology & Oncology

## 2016-05-18 ENCOUNTER — Encounter (HOSPITAL_BASED_OUTPATIENT_CLINIC_OR_DEPARTMENT_OTHER): Payer: Self-pay

## 2016-05-18 ENCOUNTER — Ambulatory Visit (HOSPITAL_BASED_OUTPATIENT_CLINIC_OR_DEPARTMENT_OTHER): Payer: 59 | Admitting: Hematology & Oncology

## 2016-05-18 ENCOUNTER — Other Ambulatory Visit (HOSPITAL_BASED_OUTPATIENT_CLINIC_OR_DEPARTMENT_OTHER): Payer: 59

## 2016-05-18 VITALS — BP 114/75 | HR 93 | Temp 97.3°F | Resp 16 | Ht 72.0 in | Wt 163.4 lb

## 2016-05-18 DIAGNOSIS — C787 Secondary malignant neoplasm of liver and intrahepatic bile duct: Secondary | ICD-10-CM | POA: Diagnosis not present

## 2016-05-18 DIAGNOSIS — C7B8 Other secondary neuroendocrine tumors: Secondary | ICD-10-CM

## 2016-05-18 DIAGNOSIS — C7A8 Other malignant neuroendocrine tumors: Secondary | ICD-10-CM

## 2016-05-18 DIAGNOSIS — Z8589 Personal history of malignant neoplasm of other organs and systems: Secondary | ICD-10-CM | POA: Diagnosis not present

## 2016-05-18 DIAGNOSIS — D480 Neoplasm of uncertain behavior of bone and articular cartilage: Secondary | ICD-10-CM | POA: Diagnosis not present

## 2016-05-18 DIAGNOSIS — N319 Neuromuscular dysfunction of bladder, unspecified: Secondary | ICD-10-CM

## 2016-05-18 DIAGNOSIS — N2889 Other specified disorders of kidney and ureter: Secondary | ICD-10-CM | POA: Diagnosis not present

## 2016-05-18 LAB — CBC WITH DIFFERENTIAL (CANCER CENTER ONLY)
BASO#: 0 10*3/uL (ref 0.0–0.2)
BASO%: 0.2 % (ref 0.0–2.0)
EOS ABS: 0.1 10*3/uL (ref 0.0–0.5)
EOS%: 0.5 % (ref 0.0–7.0)
HCT: 44.4 % (ref 38.7–49.9)
HEMOGLOBIN: 14.7 g/dL (ref 13.0–17.1)
LYMPH#: 1.1 10*3/uL (ref 0.9–3.3)
LYMPH%: 11.1 % — AB (ref 14.0–48.0)
MCH: 29.1 pg (ref 28.0–33.4)
MCHC: 33.1 g/dL (ref 32.0–35.9)
MCV: 88 fL (ref 82–98)
MONO#: 0.5 10*3/uL (ref 0.1–0.9)
MONO%: 5.5 % (ref 0.0–13.0)
NEUT%: 82.7 % — ABNORMAL HIGH (ref 40.0–80.0)
NEUTROS ABS: 8.2 10*3/uL — AB (ref 1.5–6.5)
Platelets: 259 10*3/uL (ref 145–400)
RBC: 5.05 10*6/uL (ref 4.20–5.70)
RDW: 17.6 % — ABNORMAL HIGH (ref 11.1–15.7)
WBC: 9.9 10*3/uL (ref 4.0–10.0)

## 2016-05-18 LAB — CMP (CANCER CENTER ONLY)
ALBUMIN: 3.2 g/dL — AB (ref 3.3–5.5)
ALT(SGPT): 55 U/L — ABNORMAL HIGH (ref 10–47)
AST: 104 U/L — ABNORMAL HIGH (ref 11–38)
Alkaline Phosphatase: 149 U/L — ABNORMAL HIGH (ref 26–84)
BUN, Bld: 13 mg/dL (ref 7–22)
CHLORIDE: 96 meq/L — AB (ref 98–108)
CO2: 31 meq/L (ref 18–33)
Calcium: 9.1 mg/dL (ref 8.0–10.3)
Creat: 0.7 mg/dl (ref 0.6–1.2)
Glucose, Bld: 261 mg/dL — ABNORMAL HIGH (ref 73–118)
POTASSIUM: 4.4 meq/L (ref 3.3–4.7)
SODIUM: 133 meq/L (ref 128–145)
Total Bilirubin: 0.7 mg/dl (ref 0.20–1.60)
Total Protein: 7.3 g/dL (ref 6.4–8.1)

## 2016-05-18 LAB — LACTATE DEHYDROGENASE: LDH: 205 U/L (ref 125–245)

## 2016-05-18 MED ORDER — LANREOTIDE ACETATE 120 MG/0.5ML ~~LOC~~ SOLN
120.0000 mg | Freq: Once | SUBCUTANEOUS | Status: AC
  Administered 2016-05-18: 120 mg via SUBCUTANEOUS
  Filled 2016-05-18: qty 120

## 2016-05-18 MED ORDER — IOPAMIDOL (ISOVUE-300) INJECTION 61%
100.0000 mL | Freq: Once | INTRAVENOUS | Status: AC | PRN
  Administered 2016-05-18: 100 mL via INTRAVENOUS

## 2016-05-18 NOTE — Progress Notes (Signed)
Hematology and Oncology Follow Up Visit  Lemar Lyga [id number] 1961-05-28 55 y.o. 05/18/2016   Principle Diagnosis:   Metastatic neuroendocrine carcinoma  Von Hipple-Lindau Syndrome  Current Therapy:  Temodar/Xeloda - start 03/17/2016 - s/p c#3  Somatuline 120mg  SQ q month     Interim History:  Mr.  Vanwie is back for followup. He actually looks quite good.   Unfortunately, his CT scan does not look that great. We did the CT scan today area and it showed that he has progression of his hepatic metastasis. Everything else looks very stable.  His last chromogranin A level was down to 3. As such, I'm somewhat surprised by the CT results.  He is not having any abdominal pain. He does have the indwelling suprapubic catheter. He has on occasion urinary tract infections. These have been a little bit difficult to treat as there is some resistance with the bacteria to antibiotics.  His blood sugars have been a little on the higher side. I'm sure this probably is from the chemotherapy.  There has not been any rashes. He does have a rash on his face which she has had on occasion.  The neuropathy in his hands seems to be getting a little bit better.  His appetite has been good. He and his wife are very diligent with watching his blood sugars.  Currently, his overall performance status is ECOG 1.   Medications:  Current outpatient prescriptions:  .  ACCU-CHEK SMARTVIEW test strip, , Disp: , Rfl: 10 .  baclofen (LIORESAL) 20 MG tablet, Take 1-2 tablets (20-40 mg total) by mouth 4 (four) times daily., Disp: 120 each, Rfl: 3 .  Balsam Peru-Castor Oil (VENELEX) OINT, Apply topically as needed, Disp: 60 g, Rfl: 4 .  capecitabine (XELODA) 500 MG tablet, Take 2 tablets TWICE a day with food for 14 days on and 14 days off., Disp: 56 tablet, Rfl: 4 .  diazepam (VALIUM) 5 MG tablet, TAKE 1 TABLET BY MOUTH 3 TIMES DAILY FOR SPASMS/ANXIETY, Disp: 90 tablet, Rfl: 1 .  doxazosin (CARDURA) 1 MG  tablet, Take 1 tablet (1 mg total) by mouth 2 (two) times daily. (Patient taking differently: Take 1 mg by mouth 2 (two) times daily as needed (blood pressure spikes.). ), Disp: 60 tablet, Rfl: 2 .  dronabinol (MARINOL) 5 MG capsule, TAKE 1 CAPSULE BY MOUTH TWICE DAILY WITH MEALS, Disp: 60 capsule, Rfl: 1 .  everolimus (AFINITOR) 10 MG tablet, Take 1 tablet (10 mg total) by mouth daily., Disp: 30 tablet, Rfl: 2 .  gabapentin (NEURONTIN) 300 MG capsule, Take 2 capsules (600 mg total) by mouth 3 (three) times daily., Disp: 180 capsule, Rfl: 6 .  HYDROcodone-acetaminophen (NORCO/VICODIN) 5-325 MG tablet, Take 1 tablet by mouth every 6 (six) hours as needed for moderate pain., Disp: 90 tablet, Rfl: 0 .  HYDROmorphone (DILAUDID) 4 MG tablet, Take 4 mg by mouth every 6 (six) hours as needed for severe pain., Disp: , Rfl:  .  insulin glargine (LANTUS) 100 UNIT/ML injection, Inject 31 Units into the skin daily before breakfast. , Disp: , Rfl:  .  insulin lispro (HUMALOG) 100 UNIT/ML injection, Inject 3-9 Units into the skin 3 (three) times daily before meals. SS, Disp: , Rfl:  .  midodrine (PROAMATINE) 5 MG tablet, TKAE 1/2 TABLET BY MOUTH TWICE A DAY AS NEEDED (Patient taking differently: TKAE 1/2 TABLET BY MOUTH TWICE A DAY AS NEEDED BLOOD PRESSURE.), Disp: 60 tablet, Rfl: 6 .  oxybutynin (DITROPAN) 5 MG  tablet, , Disp: , Rfl: 3 .  Pancrelipase, Lip-Prot-Amyl, 24000 units CPEP, Take 3 capsules (72,000 Units total) by mouth 3 (three) times daily with meals., Disp: 270 capsule, Rfl: 3 .  promethazine (PHENERGAN) 12.5 MG tablet, Take 12.5 mg by mouth every 6 (six) hours as needed for nausea. , Disp: , Rfl:  .  temozolomide (TEMODAR) 140 MG capsule, TAKE 2 CAPSULES A DAY FOR 4 DAYS. START ON DAY 10 OF EACH 28 DAY CYCLE., Disp: 8 capsule, Rfl: 4 .  tiZANidine (ZANAFLEX) 4 MG tablet, TAKE 2 TABLETS BY MOUTH EVERY 6 HOURS AS NEEDED FOR PAIN, Disp: 30 tablet, Rfl: 3 .  traMADol (ULTRAM) 50 MG tablet, TAKE 1 TABLET  BY MOUTH THREE TIMES DAILY, Disp: 90 tablet, Rfl: 2  Allergies:  Allergies  Allergen Reactions  . Ciprocin-Fluocin-Procin [Fluocinolone Acetonide] Rash    Pt states this happened with IV Cipro. ABLE TO TAKE PO CIPRO.  . Other Rash    IV-CIPRO    Past Medical History, Surgical history, Social history, and Family History were reviewed and updated.  Review of Systems: As above  Physical Exam:  height is 6' (1.829 m) and weight is 163 lb 6.4 oz (74.118 kg). His oral temperature is 97.3 F (36.3 C). His blood pressure is 114/75 and his pulse is 93. His respiration is 16.   Well-developed and well-nourished gentleman. He is paralyzed from the waist down. His head exam shows no ocular or oral lesions. He has no palpable cervical or supraclavicular lymph nodes. His lungs are clear. Cardiac exam regular rate and rhythm with no murmurs, rubs or bruits. Abdomen is soft. Has good bowel sounds. There is no fluid wave. He has a well-healed laparotomy scar is. He has no obvious abdominal mass. There is no obvious liver or spleen tip. Back exam shows a laminectomy scar in the neck and upper thoracic spine. Extremities shows no swelling in his lower legs. He has no strength secondary to the paralysis. Neurological exam is nonfocal. Skin exam shows no rashes.  Lab Results  Component Value Date   WBC 9.9 05/18/2016   HGB 14.7 05/18/2016   HCT 44.4 05/18/2016   MCV 88 05/18/2016   PLT 259 05/18/2016     Chemistry      Component Value Date/Time   NA 133 05/18/2016 1046   NA 134* 04/05/2016 1611   NA 138 09/27/2015 0518   K 4.4 05/18/2016 1046   K 4.0 04/05/2016 1611   K 3.7 09/27/2015 0518   CL 96* 05/18/2016 1046   CL 102 09/27/2015 0518   CO2 31 05/18/2016 1046   CO2 26 04/05/2016 1611   CO2 29 09/27/2015 0518   BUN 13 05/18/2016 1046   BUN 18.7 04/05/2016 1611   BUN 10 09/27/2015 0518   CREATININE 0.7 05/18/2016 1046   CREATININE 0.9 04/05/2016 1611   CREATININE 0.75 09/27/2015 0518       Component Value Date/Time   CALCIUM 9.1 05/18/2016 1046   CALCIUM 8.9 04/05/2016 1611   CALCIUM 8.3* 09/27/2015 0518   ALKPHOS 149* 05/18/2016 1046   ALKPHOS 124 04/05/2016 1611   ALKPHOS 103 09/25/2015 0250   AST 104* 05/18/2016 1046   AST 31 04/05/2016 1611   AST 31 09/25/2015 0250   ALT 55* 05/18/2016 1046   ALT 26 04/05/2016 1611   ALT 20 09/25/2015 0250   BILITOT 0.70 05/18/2016 1046   BILITOT 0.76 04/05/2016 1611   BILITOT 0.5 09/25/2015 0250  Impression and Plan: Mr. Lorman is 55 year old gentleman with von Hippel-Lindau syndrome. He has multiple neuroendocrine tumors. 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 He's had multiple spinal surgeries.  I am quite surprised by the results of his CT scan. I really would not have expected his tumors to be more prominent. He has been on Temodar/Xeloda before. He is done well with these.  I think we are going to have to biopsy one of the lesions. I need to make sure that he is not transforming to a more high-grade neuroendocrine tumor that we would have to utilize chemotherapy.   As far as further therapy goes, I would like to try to do some more intrahepatic therapy. He has had intrahepatic radiation embolization. I just wonder if he might benefit from some type of intrahepatic chemotherapy. Possibly intrahepatic Adriamycin. I will have see with one of the interventional radiologist.  We'll see by getting a biopsy this week. I would then send the biopsy off for genetic analysis to see if there is any targeted therapy we might be able to consider. It might be a "stretch" as I am sure his insurance company would be hasn't to pay for targeted therapy.   He has done so well. Again, I'm just somewhat surprised.   I spent about 40 minutes with he and his wife.   Volanda Napoleon, MD 6/13/201712:54 PM

## 2016-05-18 NOTE — Patient Instructions (Signed)
Lanreotide injection What is this medicine? LANREOTIDE (lan REE oh tide) is used to reduce blood levels of growth hormone in patients with a condition called acromegaly. It also works to slow or stop tumor growth in patients with gastroenteropancreatic neuroendocrine tumor (GEP-NET). This medicine may be used for other purposes; ask your health care provider or pharmacist if you have questions. What should I tell my health care provider before I take this medicine? They need to know if you have any of these conditions: -diabetes -gallbladder disease -heart disease -kidney disease -liver disease -an unusual or allergic reaction to lanreotide, other medicines, latex, foods, dyes, or preservatives -pregnant or trying to get pregnant -breast-feeding How should I use this medicine? This medicine is for injection under the skin. It is given by a health care professional in a hospital or clinic setting. Contact your pediatrician or health care professional regarding the use of this medicine in children. Special care may be needed. Overdosage: If you think you have taken too much of this medicine contact a poison control center or emergency room at once. NOTE: This medicine is only for you. Do not share this medicine with others. What if I miss a dose? It is important not to miss your dose. Call your doctor or health care professional if you are unable to keep an appointment. What may interact with this medicine? -bromocriptine -cyclosporine -medicines for diabetes, including insulin -medicines for heart disease or hypertension -quinidine This list may not describe all possible interactions. Give your health care provider a list of all the medicines, herbs, non-prescription drugs, or dietary supplements you use. Also tell them if you smoke, drink alcohol, or use illegal drugs. Some items may interact with your medicine. What should I watch for while using this medicine? Visit your doctor or  health care professional for regular checks on your progress. Your condition will be monitored carefully while you are receiving this medicine. This medicine may cause increases or decreases in blood sugar. Signs of high blood sugar include frequent urination, unusual thirst, flushed or dry skin, difficulty breathing, drowsiness, stomach ache, nausea, vomiting or dry mouth. Signs of low blood sugar include chills, cool, pale skin or cold sweats, drowsiness, extreme hunger, fast heartbeat, headache, nausea, nervousness or anxiety, shakiness, trembling, unsteadiness, tiredness, or weakness. Contact your doctor or health care professional right away if you experience any of these symptoms. What side effects may I notice from receiving this medicine? Side effects that you should report to your doctor or health care professional as soon as possible: -allergic reactions like skin rash, itching or hives, swelling of the face, lips, or tongue -changes in blood sugar -changes in heart rate -severe stomach pain Side effects that usually do not require medical attention (report to your doctor or health care professional if they continue or are bothersome): -diarrhea or constipation -gas or stomach pain -nausea, vomiting -pain, redness, swelling and irritation at site where injected This list may not describe all possible side effects. Call your doctor for medical advice about side effects. You may report side effects to FDA at 1-800-FDA-1088. Where should I keep my medicine? This drug is given in a hospital or clinic and will not be stored at home. NOTE: This sheet is a summary. It may not cover all possible information. If you have questions about this medicine, talk to your doctor, pharmacist, or health care provider.    2016, Elsevier/Gold Standard. (2013-11-21 17:43:04)

## 2016-05-19 LAB — PREALBUMIN: PREALBUMIN: 14 mg/dL (ref 10–36)

## 2016-05-19 LAB — CHROMOGRANIN A: Chromogranin A: 2 nmol/L (ref 0–5)

## 2016-05-25 ENCOUNTER — Other Ambulatory Visit: Payer: Self-pay | Admitting: Hematology & Oncology

## 2016-05-25 MED FILL — HUMALOG 100 UNITS/ML KWIKPE: 100 | 60 days supply | Qty: 15 | Fill #0

## 2016-05-25 MED FILL — traMADol HCL 50 MG TABS: 50 | 30 days supply | Qty: 90 | Fill #0

## 2016-05-26 ENCOUNTER — Other Ambulatory Visit: Payer: Self-pay | Admitting: Radiology

## 2016-05-26 DIAGNOSIS — H35371 Puckering of macula, right eye: Secondary | ICD-10-CM | POA: Diagnosis not present

## 2016-05-26 DIAGNOSIS — Q858 Other phakomatoses, not elsewhere classified: Secondary | ICD-10-CM | POA: Diagnosis not present

## 2016-05-26 DIAGNOSIS — D1809 Hemangioma of other sites: Secondary | ICD-10-CM | POA: Diagnosis not present

## 2016-05-27 ENCOUNTER — Ambulatory Visit (HOSPITAL_COMMUNITY)
Admission: RE | Admit: 2016-05-27 | Discharge: 2016-05-27 | Disposition: A | Discharge status: Home / Self Care | Payer: 59 | Source: Ambulatory Visit | Attending: Hematology & Oncology | Admitting: Hematology & Oncology

## 2016-05-27 ENCOUNTER — Encounter (HOSPITAL_COMMUNITY): Payer: Self-pay

## 2016-05-27 DIAGNOSIS — C7B8 Other secondary neuroendocrine tumors: Secondary | ICD-10-CM | POA: Diagnosis not present

## 2016-05-27 DIAGNOSIS — Z794 Long term (current) use of insulin: Secondary | ICD-10-CM | POA: Diagnosis not present

## 2016-05-27 DIAGNOSIS — C7A8 Other malignant neuroendocrine tumors: Secondary | ICD-10-CM | POA: Diagnosis present

## 2016-05-27 DIAGNOSIS — Z79899 Other long term (current) drug therapy: Secondary | ICD-10-CM | POA: Insufficient documentation

## 2016-05-27 DIAGNOSIS — K7689 Other specified diseases of liver: Secondary | ICD-10-CM | POA: Diagnosis not present

## 2016-05-27 LAB — CBC
HEMATOCRIT: 44.5 % (ref 39.0–52.0)
Hemoglobin: 14.6 g/dL (ref 13.0–17.0)
MCH: 28.5 pg (ref 26.0–34.0)
MCHC: 32.8 g/dL (ref 30.0–36.0)
MCV: 86.9 fL (ref 78.0–100.0)
PLATELETS: 280 10*3/uL (ref 150–400)
RBC: 5.12 MIL/uL (ref 4.22–5.81)
RDW: 16.5 % — AB (ref 11.5–15.5)
WBC: 8.7 10*3/uL (ref 4.0–10.5)

## 2016-05-27 LAB — PROTIME-INR
INR: 1.11 (ref 0.00–1.49)
Prothrombin Time: 14.5 seconds (ref 11.6–15.2)

## 2016-05-27 LAB — APTT: aPTT: 31 seconds (ref 24–37)

## 2016-05-27 LAB — GLUCOSE, CAPILLARY
GLUCOSE-CAPILLARY: 94 mg/dL (ref 65–99)
Glucose-Capillary: 149 mg/dL — ABNORMAL HIGH (ref 65–99)
Glucose-Capillary: 95 mg/dL (ref 65–99)

## 2016-05-27 MED ORDER — MIDAZOLAM HCL 2 MG/2ML IJ SOLN
INTRAMUSCULAR | Status: AC
  Filled 2016-05-27: qty 6

## 2016-05-27 MED ORDER — FENTANYL CITRATE (PF) 100 MCG/2ML IJ SOLN
INTRAMUSCULAR | Status: AC | PRN
  Administered 2016-05-27: 50 ug via INTRAVENOUS
  Administered 2016-05-27: 25 ug via INTRAVENOUS

## 2016-05-27 MED ORDER — MIDAZOLAM HCL 2 MG/2ML IJ SOLN
INTRAMUSCULAR | Status: AC | PRN
  Administered 2016-05-27 (×4): 1 mg via INTRAVENOUS

## 2016-05-27 MED ORDER — DEXTROSE-NACL 5-0.45 % IV SOLN
INTRAVENOUS | Status: DC
  Administered 2016-05-27: 13:00:00 via INTRAVENOUS

## 2016-05-27 MED ORDER — FENTANYL CITRATE (PF) 100 MCG/2ML IJ SOLN
INTRAMUSCULAR | Status: AC
  Filled 2016-05-27: qty 4

## 2016-05-27 MED ORDER — SODIUM CHLORIDE 0.9 % IV SOLN
Freq: Once | INTRAVENOUS | Status: AC
  Administered 2016-05-27: 12:00:00 via INTRAVENOUS

## 2016-05-27 NOTE — Progress Notes (Signed)
Patient ID: Scott Darner., male   DOB: 28-Jun-1961, 55 y.o.   MRN: [id number]    Referring Physician(s): Volanda Napoleon  Supervising Physician: Markus Daft  Patient Status:  Outpatient  Chief Complaint:  Metastatic neuroendocrine carcinoma  Subjective: Patient familiar to IR service from prior hepatic Y 90 radioembolizations, initially in 2011 and followed again in 2013 as well as 2014. He has a known history of metastatic neuroendocrine carcinoma as well as Von Hippel-Lindau syndrome. Postchemotherapy staging CT performed on 05/18/16  revealed interval increase in size of multiple hypervascular hepatic metastasis, grossly unchanged enhancing renal masses and multiple unchanged spinal hemangioblastomas. He presents again today for ultrasound-guided liver lesion biopsy for genetic analysis and to rule out transformation to a more high-grade neuroendocrine tumor. He currently denies fever, headache, chest pain, dyspnea, cough, nausea, vomiting or abnormal bleeding. He does have some occasional abdominal distention as well as back "tightness". 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   Allergies: Ciprocin-fluocin-procin and Other  Medications: Prior to Admission medications   Medication Sig Start Date End Date Taking? Authorizing Provider  ACCU-CHEK SMARTVIEW test strip  05/10/16   Historical Provider, MD  baclofen (LIORESAL) 20 MG tablet Take 1-2 tablets (20-40 mg total) by mouth 4 (four) times daily. 04/15/16   Volanda Napoleon, MD  Roseanne Kaufman Peru-Castor Oil [facility]) OINT Apply topically as needed 03/17/16   Volanda Napoleon, MD  capecitabine (XELODA) 500 MG tablet Take 2 tablets TWICE a day with food for 14 days on and 14 days off. 03/16/16   Volanda Napoleon, MD  diazepam (VALIUM) 5 MG tablet TAKE 1 TABLET BY MOUTH 3 TIMES DAILY FOR SPASMS/ANXIETY 02/02/16   Volanda Napoleon, MD  doxazosin (CARDURA) 1 MG tablet Take 1 tablet (1 mg total) by mouth 2  (two) times daily. Patient taking differently: Take 1 mg by mouth 2 (two) times daily as needed (blood pressure spikes.).  09/03/14   Volanda Napoleon, MD  dronabinol (MARINOL) 5 MG capsule TAKE 1 CAPSULE BY MOUTH TWICE DAILY WITH MEALS 05/11/16   Volanda Napoleon, MD  everolimus (AFINITOR) 10 MG tablet Take 1 tablet (10 mg total) by mouth daily. 02/02/16   Volanda Napoleon, MD  gabapentin (NEURONTIN) 300 MG capsule Take 2 capsules (600 mg total) by mouth 3 (three) times daily. 01/15/16   Volanda Napoleon, MD  HYDROcodone-acetaminophen (NORCO/VICODIN) 5-325 MG tablet Take 1 tablet by mouth every 6 (six) hours as needed for moderate pain. 01/15/16   Volanda Napoleon, MD  HYDROmorphone (DILAUDID) 4 MG tablet Take 4 mg by mouth every 6 (six) hours as needed for severe pain.    Historical Provider, MD  insulin glargine (LANTUS) 100 UNIT/ML injection Inject 31 Units into the skin daily before breakfast.     Historical Provider, MD  insulin lispro (HUMALOG) 100 UNIT/ML injection Inject 3-9 Units into the skin 3 (three) times daily before meals. SS    Historical Provider, MD  midodrine (PROAMATINE) 5 MG tablet TKAE 1/2 TABLET BY MOUTH TWICE A DAY AS NEEDED Patient taking differently: TKAE 1/2 TABLET BY MOUTH TWICE A DAY AS NEEDED BLOOD PRESSURE. 08/18/15   Volanda Napoleon, MD  oxybutynin (DITROPAN) 5 MG tablet  05/10/16   Historical Provider, MD  Pancrelipase, Lip-Prot-Amyl, 24000 units CPEP Take 3 capsules (72,000 Units total) by mouth 3 (three) times daily with meals. 02/12/16   Volanda Napoleon, MD  promethazine (PHENERGAN) 12.5 MG tablet Take  12.5 mg by mouth every 6 (six) hours as needed for nausea.     Historical Provider, MD  temozolomide (TEMODAR) 140 MG capsule TAKE 2 CAPSULES A DAY FOR 4 DAYS. START ON DAY 10 OF EACH 28 DAY CYCLE. 03/16/16   Volanda Napoleon, MD  tiZANidine (ZANAFLEX) 4 MG tablet TAKE 2 TABLETS BY MOUTH EVERY 6 HOURS AS NEEDED FOR PAIN 03/08/16   Volanda Napoleon, MD  traMADol (ULTRAM) 50 MG tablet TAKE  1 TABLET BY MOUTH 3 TIMES DAILY 05/25/16   Volanda Napoleon, MD     Vital Signs:   Physical Exam patient awake, alert. Chest with slightly diminished breath sounds right base, left clear. Heart with regular rate and rhythm. Abdomen soft, positive bowel sounds, nontender; no significant lower extremity edema, wounds noted on both lower extremities but appear to be healing per discussion with patient and wife. Patient paralyzed from waist down.  Imaging: No results found.  Labs:  CBC:  Recent Labs  03/16/16 0914 04/05/16 1611 04/14/16 1124 05/18/16 1046  WBC 9.8 8.4 9.7 9.9  HGB 13.7 13.9 12.7* 14.7  HCT 42.8 42.5 39.1 44.4  PLT 266 293 291 259    COAGS: No results for input(s): INR, APTT in the last 8760 hours.  BMP:  Recent Labs  09/24/15 1259 09/25/15 0250 09/26/15 0220 09/27/15 0518  12/02/15 1107  03/16/16 0914 04/05/16 1611 04/14/16 1124 05/18/16 1046  NA 137 138 142 138  < > 139  < > 139 134* 133 133  K 3.5 3.5 3.3* 3.7  < > 3.7  < > 4.5 4.0 3.7 4.4  CL 101 104 104 102  --  97*  --  98  --  96* 96*  CO2 27 27 29 29   < > 30  < > 30 26 24 31   GLUCOSE 148* 256* 120* 144*  < > 166*  < > 109 206* 236* 261*  BUN 16 13 10 10   < > 19  < > 14 18.7 16 13   CALCIUM 8.2* 7.9* 8.2* 8.3*  < > 8.8  < > 8.9 8.9 8.9 9.1  CREATININE 0.93 0.90 0.72 0.75  < > 0.9  < > 0.7 0.9 0.9 0.7  GFRNONAA >60 >60 >60 >60  --   --   --   --   --   --   --   GFRAA >60 >60 >60 >60  --   --   --   --   --   --   --   < > = values in this interval not displayed.  LIVER FUNCTION TESTS:  Recent Labs  03/16/16 0914 04/05/16 1611 04/14/16 1124 05/18/16 1046  BILITOT 0.60 0.76 0.70 0.70  AST 41* 31 34 104*  ALT 37 26 25 55*  ALKPHOS 113* 124 104* 149*  PROT 7.3 7.2 6.8 7.3  ALBUMIN 2.9* 3.2* 2.8* 3.2*    Assessment and Plan: Pt with known history of metastatic neuroendocrine carcinoma as well as Von Hippel-Lindau syndrome. He has had prior hepatic Y 90 radioembolization procedures in  2011 ,2013 and 2014. Postchemotherapy staging CT performed on 05/18/16  revealed interval increase in size of multiple hypervascular hepatic metastasis, grossly unchanged enhancing renal masses and multiple unchanged spinal hemangioblastomas. He presents again today for ultrasound-guided liver lesion biopsy for genetic analysis and to rule out transformation to a more high-grade neuroendocrine tumor.Risks and benefits discussed with the patient/wife including, but not limited to bleeding, infection, damage to adjacent structures  or low yield requiring additional tests.All of the patient's questions were answered, patient is agreeable to proceed.Consent signed and in chart.     Electronically Signed: D. Rowe Robert 05/27/2016, 11:25 AM   I spent a total of 25 minutes at the the patient's bedside AND on the patient's hospital floor or unit, greater than 50% of which was counseling/coordinating care for image guided liver lesion biopsy

## 2016-05-27 NOTE — Procedures (Signed)
US guided core biopsies of a right hepatic lesion.  4 cores obtained.  No immediate complication.  Minimal blood loss. 

## 2016-05-27 NOTE — Progress Notes (Signed)
Pt states he feels like blood sugar is dropping.  CBG 94, meter did not recognize ID band. Rowe Robert notified. IVF changed , see order.

## 2016-05-27 NOTE — Discharge Instructions (Signed)
Moderate Conscious Sedation, Adult, Care After Refer to this sheet in the next few weeks. These instructions provide you with information on caring for yourself after your procedure. Your health care provider may also give you more specific instructions. Your treatment has been planned according to current medical practices, but problems sometimes occur. Call your health care provider if you have any problems or questions after your procedure. WHAT TO EXPECT AFTER THE PROCEDURE  After your procedure:  You may feel sleepy, clumsy, and have poor balance for several hours.  Vomiting may occur if you eat too soon after the procedure. HOME CARE INSTRUCTIONS  Do not participate in any activities where you could become injured for at least 24 hours. Do not:  Drive.  Swim.  Ride a bicycle.  Operate heavy machinery.  Cook.  Use power tools.  Climb ladders.  Work from a high place.  Do not make important decisions or sign legal documents until you are improved.  If you vomit, drink water, juice, or soup when you can drink without vomiting. Make sure you have little or no nausea before eating solid foods.  Only take over-the-counter or prescription medicines for pain, discomfort, or fever as directed by your health care provider.  Make sure you and your family fully understand everything about the medicines given to you, including what side effects may occur.  You should not drink alcohol, take sleeping pills, or take medicines that cause drowsiness for at least 24 hours.  If you smoke, do not smoke without supervision.  If you are feeling better, you may resume normal activities 24 hours after you were sedated.  Keep all appointments with your health care provider. SEEK MEDICAL CARE IF:  Your skin is pale or bluish in color.  You continue to feel nauseous or vomit.  Your pain is getting worse and is not helped by medicine.  You have bleeding or swelling.  You are still  sleepy or feeling clumsy after 24 hours. SEEK IMMEDIATE MEDICAL CARE IF:  You develop a rash.  You have difficulty breathing.  You develop any type of allergic problem.  You have a fever. MAKE SURE YOU:  Understand these instructions.  Will watch your condition.  Will get help right away if you are not doing well or get worse.   This information is not intended to replace advice given to you by your health care provider. Make sure you discuss any questions you have with your health care provider.   Document Released: 09/12/2013 Document Revised: 12/13/2014 Document Reviewed: 09/12/2013 Elsevier Interactive Patient Education 2016 [place].  Liver Biopsy, Care After Refer to this sheet in the next few weeks. These instructions provide you with information on caring for yourself after your procedure. Your health care provider may also give you more specific instructions. Your treatment has been planned according to current medical practices, but problems sometimes occur. Call your health care provider if you have any problems or questions after your procedure. WHAT TO EXPECT AFTER THE PROCEDURE After your procedure, it is typical to have the following:  A small amount of discomfort in the area where the biopsy was done and in the right shoulder or shoulder blade.  A small amount of bruising around the area where the biopsy was done and on the skin over the liver.  Sleepiness and fatigue for the rest of the day. HOME CARE INSTRUCTIONS   Rest at home for 1-2 days or as directed by your health care provider.  Have a friend or family member stay with you for at least 24 hours.  Because of the medicines used during the procedure, you should not do the following things in the first 24 hours:  Drive.  Use machinery.  Be responsible for the care of other people.  Sign legal documents.  Take a bath or shower.  There are many different ways to close and cover an incision,  including stitches, skin glue, and adhesive strips. Follow your health care provider's instructions on:  Incision care.  Bandage (dressing) changes and removal.  Incision closure removal.  Do not drink alcohol in the first week.  Do not lift more than 5 pounds or play contact sports for 2 weeks after this test.  Take medicines only as directed by your health care provider. Do not take medicine containing aspirin or non-steroidal anti-inflammatory medicines such as ibuprofen for 1 week after this test.  It is your responsibility to get your test results. SEEK MEDICAL CARE IF:   You have increased bleeding from an incision that results in more than a small spot of blood.  You have redness, swelling, or increasing pain in any incisions.  You notice a discharge or a bad smell coming from any of your incisions.  You have a fever or chills. SEEK IMMEDIATE MEDICAL CARE IF:   You develop swelling, bloating, or pain in your abdomen.  You become dizzy or faint.  You develop a rash.  You are nauseous or vomit.  You have difficulty breathing, feel short of breath, or feel faint.  You develop chest pain.  You have problems with your speech or vision.  You have trouble balancing or moving your arms or legs.   This information is not intended to replace advice given to you by your health care provider. Make sure you discuss any questions you have with your health care provider.   Document Released: 06/11/2005 Document Revised: 12/13/2014 Document Reviewed: 01/18/2014 Elsevier Interactive Patient Education [employer].

## 2016-05-28 MED FILL — LANTUS 100 UNITS/ML VIAL: 100 | 28 days supply | Qty: 10 | Fill #0

## 2016-05-28 MED FILL — GABAPENTIN 300 MG CAPSULE: 300 | 30 days supply | Qty: 180 | Fill #4

## 2016-05-30 MED FILL — BD INSULIN SYR 0.5 ML 30GX1: 30G X 1/2" | 90 days supply | Qty: 100 | Fill #1

## 2016-05-31 ENCOUNTER — Other Ambulatory Visit: Payer: Self-pay | Admitting: Hematology & Oncology

## 2016-05-31 MED FILL — BACLOFEN 20 MG TABLET: 20 | 15 days supply | Qty: 120 | Fill #1

## 2016-06-02 MED FILL — CREON DR 24,000 UNITS CAP: 24000-76000 | 30 days supply | Qty: 270 | Fill #0

## 2016-06-07 MED FILL — ACCU-CHEK SMARTVIEW TEST ST: 25 days supply | Qty: 100 | Fill #1

## 2016-06-10 ENCOUNTER — Other Ambulatory Visit: Payer: 59

## 2016-06-10 ENCOUNTER — Ambulatory Visit: Payer: 59

## 2016-06-10 ENCOUNTER — Ambulatory Visit: Payer: 59 | Admitting: Hematology & Oncology

## 2016-06-10 MED FILL — DRONABINOL 5 MG CAPSULE: 5 | 30 days supply | Qty: 60 | Fill #1

## 2016-06-21 ENCOUNTER — Ambulatory Visit (HOSPITAL_BASED_OUTPATIENT_CLINIC_OR_DEPARTMENT_OTHER): Payer: 59 | Admitting: Hematology & Oncology

## 2016-06-21 ENCOUNTER — Other Ambulatory Visit (HOSPITAL_BASED_OUTPATIENT_CLINIC_OR_DEPARTMENT_OTHER): Payer: 59

## 2016-06-21 ENCOUNTER — Encounter: Payer: Self-pay | Admitting: Hematology & Oncology

## 2016-06-21 ENCOUNTER — Ambulatory Visit (HOSPITAL_BASED_OUTPATIENT_CLINIC_OR_DEPARTMENT_OTHER): Payer: 59

## 2016-06-21 VITALS — BP 85/48 | HR 79 | Temp 97.5°F | Resp 18 | Ht 72.0 in | Wt 163.0 lb

## 2016-06-21 VITALS — BP 124/99 | HR 96 | Resp 18

## 2016-06-21 DIAGNOSIS — C7B8 Other secondary neuroendocrine tumors: Secondary | ICD-10-CM | POA: Diagnosis not present

## 2016-06-21 DIAGNOSIS — Q858 Other phakomatoses, not elsewhere classified: Secondary | ICD-10-CM | POA: Diagnosis not present

## 2016-06-21 DIAGNOSIS — N319 Neuromuscular dysfunction of bladder, unspecified: Secondary | ICD-10-CM

## 2016-06-21 DIAGNOSIS — C7A8 Other malignant neuroendocrine tumors: Secondary | ICD-10-CM

## 2016-06-21 LAB — CBC WITH DIFFERENTIAL (CANCER CENTER ONLY)
BASO#: 0 10*3/uL (ref 0.0–0.2)
BASO%: 0.5 % (ref 0.0–2.0)
EOS%: 1.6 % (ref 0.0–7.0)
Eosinophils Absolute: 0.1 10*3/uL (ref 0.0–0.5)
HEMATOCRIT: 43.4 % (ref 38.7–49.9)
HEMOGLOBIN: 14.2 g/dL (ref 13.0–17.1)
LYMPH#: 1.6 10*3/uL (ref 0.9–3.3)
LYMPH%: 18.2 % (ref 14.0–48.0)
MCH: 28.6 pg (ref 28.0–33.4)
MCHC: 32.7 g/dL (ref 32.0–35.9)
MCV: 88 fL (ref 82–98)
MONO#: 0.9 10*3/uL (ref 0.1–0.9)
MONO%: 9.7 % (ref 0.0–13.0)
NEUT%: 70 % (ref 40.0–80.0)
NEUTROS ABS: 6.2 10*3/uL (ref 1.5–6.5)
Platelets: 277 10*3/uL (ref 145–400)
RBC: 4.96 10*6/uL (ref 4.20–5.70)
RDW: 14.1 % (ref 11.1–15.7)
WBC: 8.9 10*3/uL (ref 4.0–10.0)

## 2016-06-21 LAB — CMP (CANCER CENTER ONLY)
ALBUMIN: 3 g/dL — AB (ref 3.3–5.5)
ALT: 29 U/L (ref 10–47)
AST: 33 U/L (ref 11–38)
Alkaline Phosphatase: 101 U/L — ABNORMAL HIGH (ref 26–84)
BILIRUBIN TOTAL: 0.8 mg/dL (ref 0.20–1.60)
BUN, Bld: 18 mg/dL (ref 7–22)
CALCIUM: 8.7 mg/dL (ref 8.0–10.3)
CO2: 27 mEq/L (ref 18–33)
Chloride: 100 mEq/L (ref 98–108)
Creat: 0.4 mg/dl — ABNORMAL LOW (ref 0.6–1.2)
Glucose, Bld: 178 mg/dL — ABNORMAL HIGH (ref 73–118)
POTASSIUM: 3.6 meq/L (ref 3.3–4.7)
Sodium: 134 mEq/L (ref 128–145)
Total Protein: 6.9 g/dL (ref 6.4–8.1)

## 2016-06-21 LAB — LACTATE DEHYDROGENASE: LDH: 131 U/L (ref 125–245)

## 2016-06-21 MED ORDER — LANREOTIDE ACETATE 120 MG/0.5ML ~~LOC~~ SOLN
120.0000 mg | Freq: Once | SUBCUTANEOUS | Status: AC
  Administered 2016-06-21: 120 mg via SUBCUTANEOUS
  Filled 2016-06-21: qty 120

## 2016-06-21 NOTE — Progress Notes (Signed)
Hematology and Oncology Follow Up Visit  [name] [id number] 07-20-1961 55 y.o. 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 is back for followup. He actually looks quite good.   Unfortunately, his CT scan did not look that great. We did the CT scan about a month ago and it showed that he has progression of his hepatic metastasis. Everything else looks very stable. We did go ahead and get a biopsy of one of the liver masses. The pathology report [id number] showed a well-differentiated neuroendocrine tumor.    we went ahead and stopped the Temodar and Xeloda.  I had a phone call in to radiology. I know that he has had intrahepatic therapy in the past. I don't know if he is still a candidate for any type of intrahepatic therapy.    I know that the FDA should be approving the new radio immunotherapy for neuroendocrine carcinoma-Lutathera.   Otherwise, his blood sugars have been doing pretty well. He does have recurrent urinary tract infections because a indwelling suprapubic catheter. He's not had issues with diarrhea. He's had no rashes. He's had occasional abdominal spasms.  He is paraplegic. He gets around in his motorized wheelchair.  As always, his wife does a incredible job helping to take care of him.    His last chromogryes anin A level was down to 3. As such, I'm somewhat surprised by the CT results.  He is not having any abdominal pain. He does have the indwelling suprapubic catheter. He has on occasion urinary tract infections. These have been a little bit difficult to treat as there is some resistance with the bacteria to antibiotics.  His blood sugars have been a little on the higher side. I'm sure this probably is from the chemotherapy.  There has not been any  rashes. He does have a rash on his face which she has had on occasion.  The neuropathy in his hands seems to be getting a little bit better.  His appetite has been good. He and his wife are very diligent with watching his blood sugars.  Currently, his overall performance status is ECOG 1.   Medications:  Current outpatient prescriptions:  .  ACCU-CHEK SMARTVIEW test strip, , Disp: , Rfl: 10 .  baclofen (LIORESAL) 20 MG tablet, Take 1-2 tablets (20-40 mg total) by mouth 4 (four) times daily., Disp: 120 each, Rfl: 3 .  Balsam [place]-Castor Oil (VENELEX) OINT, Apply topically as needed, Disp: 60 g, Rfl: 4 .  CREON 24000 units CPEP, TAKE 3 CAPSULES BY MOUTH 3 TIMES DAILY WITH MEALS., Disp: 270 capsule, Rfl: 3 .  diazepam (VALIUM) 5 MG tablet, TAKE 1 TABLET BY MOUTH 3 TIMES DAILY FOR SPASMS/ANXIETY, Disp: 90 tablet, Rfl: 1 .  doxazosin (CARDURA) 1 MG tablet, Take 1 tablet (1 mg total) by mouth 2 (two) times daily. (Patient taking differently: Take 1 mg by mouth 2 (two) times daily as needed (blood pressure spikes.). ), Disp: 60 tablet, Rfl: 2 .  dronabinol (MARINOL) 5 MG capsule, TAKE 1 CAPSULE BY MOUTH TWICE DAILY WITH MEALS, Disp: 60 capsule, Rfl: 1 .  everolimus (AFINITOR) 10 MG tablet, Take 1 tablet (10 mg total) by mouth daily., Disp: 30 tablet, Rfl: 2 .  gabapentin (NEURONTIN) 300 MG capsule, Take 2 capsules (600 mg total) by  mouth 3 (three) times daily., Disp: 180 capsule, Rfl: 6 .  HYDROcodone-acetaminophen (NORCO/VICODIN) 5-325 MG tablet, Take 1 tablet by mouth every 6 (six) hours as needed for moderate pain., Disp: 90 tablet, Rfl: 0 .  HYDROmorphone (DILAUDID) 4 MG tablet, Take 4 mg by mouth every 6 (six) hours as needed for severe pain., Disp: , Rfl:  .  insulin glargine (LANTUS) 100 UNIT/ML injection, Inject 31 Units into the skin daily before breakfast. , Disp: , Rfl:  .  insulin lispro (HUMALOG) 100 UNIT/ML injection, Inject 3-9 Units into the skin 3 (three) times daily before meals.  SS, Disp: , Rfl:  .  midodrine (PROAMATINE) 5 MG tablet, TKAE 1/2 TABLET BY MOUTH TWICE A DAY AS NEEDED (Patient taking differently: TKAE 1/2 TABLET BY MOUTH TWICE A DAY AS NEEDED BLOOD PRESSURE.), Disp: 60 tablet, Rfl: 6 .  oxybutynin (DITROPAN) 5 MG tablet, , Disp: , Rfl: 3 .  promethazine (PHENERGAN) 12.5 MG tablet, Take 12.5 mg by mouth every 6 (six) hours as needed for nausea. , Disp: , Rfl:  .  traMADol (ULTRAM) 50 MG tablet, TAKE 1 TABLET BY MOUTH 3 TIMES DAILY, Disp: 90 tablet, Rfl: 2  Allergies:  Allergies  Allergen Reactions  . Ciprocin-Fluocin-Procin [Fluocinolone Acetonide] Rash    Pt states this happened with IV Cipro. ABLE TO TAKE PO CIPRO.  . Other Rash    IV-CIPRO    Past Medical History, Surgical history, Social history, and Family History were reviewed and updated.  Review of Systems: As above  Physical Exam:  height is 6' (1.829 m) and weight is 163 lb (73.936 kg). His oral temperature is 97.5 F (36.4 C). His blood pressure is 85/48 and his pulse is 79. His respiration is 18.   Well-developed and well-nourished gentleman. He is paralyzed from the waist down. His head exam shows no ocular or oral lesions. He has no palpable cervical or supraclavicular lymph nodes. His lungs are clear. Cardiac exam regular rate and rhythm with no murmurs, rubs or bruits. Abdomen is soft. Has good bowel sounds. There is no fluid wave. He has a well-healed laparotomy scar is. He has no obvious abdominal mass. There is no obvious liver or spleen tip. Back exam shows a laminectomy scar in the neck and upper thoracic spine. Extremities shows no swelling in his lower legs. He has no strength secondary to the paralysis. Neurological exam is nonfocal. Skin exam shows no rashes.  Lab Results  Component Value Date   WBC 8.9 06/21/2016   HGB 14.2 06/21/2016   HCT 43.4 06/21/2016   MCV 88 06/21/2016   PLT 277 06/21/2016     Chemistry      Component Value Date/Time   NA 134 06/21/2016 0837     NA 134* 04/05/2016 1611   NA 138 09/27/2015 0518   K 3.6 06/21/2016 0837   K 4.0 04/05/2016 1611   K 3.7 09/27/2015 0518   CL 100 06/21/2016 0837   CL 102 09/27/2015 0518   CO2 27 06/21/2016 0837   CO2 26 04/05/2016 1611   CO2 29 09/27/2015 0518   BUN 18 06/21/2016 0837   BUN 18.7 04/05/2016 1611   BUN 10 09/27/2015 0518   CREATININE 0.4* 06/21/2016 0837   CREATININE 0.9 04/05/2016 1611   CREATININE 0.75 09/27/2015 0518      Component Value Date/Time   CALCIUM 8.7 06/21/2016 0837   CALCIUM 8.9 04/05/2016 1611   CALCIUM 8.3* 09/27/2015 0518   ALKPHOS 101* 06/21/2016 [id number]  ALKPHOS 124 04/05/2016 1611   ALKPHOS 103 09/25/2015 0250   AST 33 06/21/2016 0837   AST 31 04/05/2016 1611   AST 31 09/25/2015 0250   ALT 29 06/21/2016 0837   ALT 26 04/05/2016 1611   ALT 20 09/25/2015 0250   BILITOT 0.80 06/21/2016 0837   BILITOT 0.76 04/05/2016 1611   BILITOT 0.5 09/25/2015 0250         Impression and Plan: Mr. Shiflet is 55 year old gentleman with von Hippel-Lindau syndrome. He has multiple neuroendocrine tumors. 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 He's had multiple spinal surgeries.   I'll have to see what radiology can do for [place]. Hopefully, they will be able to help [place] out. Otherwise, I'm not sure what else that we could really try on him. There probably are some chemotherapeutic options but these would certainly be aggressive.  I would like to see him back in one month. By then, radiology should be able to let us know if they can do anything else for him. It just seems as if the activity is only with his liver and not with his disease elsewhere.   [facility] does [facility]. I'm not sure when he goes up again.   I spent about 40 minutes with he and his wife.   Volanda Napoleon, MD 7/17/201712:45 PM

## 2016-06-21 NOTE — Patient Instructions (Signed)
Lanreotide injection What is this medicine? LANREOTIDE (lan REE oh tide) is used to reduce blood levels of growth hormone in patients with a condition called acromegaly. It also works to slow or stop tumor growth in patients with gastroenteropancreatic neuroendocrine tumor (GEP-NET). This medicine may be used for other purposes; ask your health care provider or pharmacist if you have questions. What should I tell my health care provider before I take this medicine? They need to know if you have any of these conditions: -diabetes -gallbladder disease -heart disease -kidney disease -liver disease -an unusual or allergic reaction to lanreotide, other medicines, latex, foods, dyes, or preservatives -pregnant or trying to get pregnant -breast-feeding How should I use this medicine? This medicine is for injection under the skin. It is given by a health care professional in a hospital or clinic setting. Contact your pediatrician or health care professional regarding the use of this medicine in children. Special care may be needed. Overdosage: If you think you have taken too much of this medicine contact a poison control center or emergency room at once. NOTE: This medicine is only for you. Do not share this medicine with others. What if I miss a dose? It is important not to miss your dose. Call your doctor or health care professional if you are unable to keep an appointment. What may interact with this medicine? -bromocriptine -cyclosporine -medicines for diabetes, including insulin -medicines for heart disease or hypertension -quinidine This list may not describe all possible interactions. Give your health care provider a list of all the medicines, herbs, non-prescription drugs, or dietary supplements you use. Also tell them if you smoke, drink alcohol, or use illegal drugs. Some items may interact with your medicine. What should I watch for while using this medicine? Visit your doctor or  health care professional for regular checks on your progress. Your condition will be monitored carefully while you are receiving this medicine. This medicine may cause increases or decreases in blood sugar. Signs of high blood sugar include frequent urination, unusual thirst, flushed or dry skin, difficulty breathing, drowsiness, stomach ache, nausea, vomiting or dry mouth. Signs of low blood sugar include chills, cool, pale skin or cold sweats, drowsiness, extreme hunger, fast heartbeat, headache, nausea, nervousness or anxiety, shakiness, trembling, unsteadiness, tiredness, or weakness. Contact your doctor or health care professional right away if you experience any of these symptoms. What side effects may I notice from receiving this medicine? Side effects that you should report to your doctor or health care professional as soon as possible: -allergic reactions like skin rash, itching or hives, swelling of the face, lips, or tongue -changes in blood sugar -changes in heart rate -severe stomach pain Side effects that usually do not require medical attention (report to your doctor or health care professional if they continue or are bothersome): -diarrhea or constipation -gas or stomach pain -nausea, vomiting -pain, redness, swelling and irritation at site where injected This list may not describe all possible side effects. Call your doctor for medical advice about side effects. You may report side effects to FDA at 1-800-FDA-1088. Where should I keep my medicine? This drug is given in a hospital or clinic and will not be stored at home. NOTE: This sheet is a summary. It may not cover all possible information. If you have questions about this medicine, talk to your doctor, pharmacist, or health care provider.    2016, Elsevier/Gold Standard. (2013-11-21 17:43:04)

## 2016-06-23 ENCOUNTER — Other Ambulatory Visit: Payer: Self-pay | Admitting: Hematology & Oncology

## 2016-06-23 DIAGNOSIS — C7B8 Other secondary neuroendocrine tumors: Principal | ICD-10-CM

## 2016-06-23 DIAGNOSIS — C7A8 Other malignant neuroendocrine tumors: Secondary | ICD-10-CM

## 2016-06-23 LAB — CHROMOGRANIN A: Chromogranin A: 3 nmol/L (ref 0–5)

## 2016-06-25 MED FILL — CREON DR 24,000 UNITS CAP: 24000-76000 | 30 days supply | Qty: 270 | Fill #1

## 2016-06-25 MED FILL — traMADol HCL 50 MG TABS: 50 | 30 days supply | Qty: 90 | Fill #1

## 2016-06-27 MED FILL — BACLOFEN 20 MG TABLET: 20 | 15 days supply | Qty: 120 | Fill #2

## 2016-06-27 MED FILL — GABAPENTIN 300 MG CAPSULE: 300 | 30 days supply | Qty: 180 | Fill #5

## 2016-06-30 ENCOUNTER — Ambulatory Visit
Admission: RE | Admit: 2016-06-30 | Discharge: 2016-06-30 | Disposition: A | Discharge status: Home / Self Care | Payer: 59 | Source: Ambulatory Visit | Attending: Hematology & Oncology | Admitting: Hematology & Oncology

## 2016-06-30 DIAGNOSIS — C7B8 Other secondary neuroendocrine tumors: Principal | ICD-10-CM

## 2016-06-30 DIAGNOSIS — C7B02 Secondary carcinoid tumors of liver: Secondary | ICD-10-CM | POA: Diagnosis not present

## 2016-06-30 DIAGNOSIS — C7A8 Other malignant neuroendocrine tumors: Secondary | ICD-10-CM

## 2016-06-30 HISTORY — PX: IR GENERIC HISTORICAL: IMG1180011

## 2016-06-30 NOTE — Progress Notes (Signed)
Patient ID: Scott Darner., male   DOB: 05/10/1961, 55 y.o.   MRN: [id number]       Chief Complaint: Von Hippel-Lindau syndrome, metastatic neuroendocrine tumor to the liver, evidence of progression in the liver by surveillance imaging.  Referring Physician(s): Ennever,Peter R  History of Present Illness: Scott Klemens. 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 He is well-known to our service. He has had extensive abdominal surgery including previous pancreatectomy, left hepatectomy and partial right nephrectomy. He has been undergoing maintenance chemotherapy for the metastatic disease to the liver. Previously, he received Y 90 radio embolization, 2 times in 2013 and once in  2014. Most recent surveillance imaging demonstrates slight progression with increase in the size of the lesions but overall stable in number in the residual right hepatic lobe. This has occurred despite the chemotherapy. Therefore, he is referred today to be evaluated for additional Y 90 radio embolization. Clinically he remains at his baseline. No significant abdominal or flank pain. No signs of jaundice or fever. No change in bowel habits. No significant episodes of flushing or tachycardia related to the neuroendocrine tumor.  Past Medical History:  Diagnosis Date  . Anemia 08/19/2014  . 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 Diabetes mellitus without complication ([place])   . Headache(784.0)    neuropathic pain usually behind eyes related to blood pressure  . HTN (hypertension) 08/19/2014  . Iron deficiency anemia, unspecified 07/10/2014  . Neuroendocrine carcinoma metastatic to liver ([place])   . Orthostatic hypotension 08/19/2014  . Transfusion history    '09 .   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 Von Hippel-Lindau syndrome ([place])    genetic disease that causes tumors    Past Surgical History:  Procedure Laterality Date  . BACK SURGERY     '91- T#-T5 "tumor  resection"-benign  . CERVICAL SPINE SURGERY     '07- resection tumor C5-C7-"benign"  . 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 COLONOSCOPY WITH PROPOFOL N/A 08/20/2014   Procedure: COLONOSCOPY WITH PROPOFOL;  Surgeon: Cleotis Nipper, MD;  Location: [facility];  Service: Endoscopy;  Laterality: N/A;  . CRANIOTOMY FOR TUMOR     medulla tumor "benign tumor"-salvage own bone for closure  . CYSTOSCOPY WITH LITHOLAPAXY N/A 02/04/2014   Procedure: CYSTOSCOPY WITH LITHOLAPAXY;  Surgeon: Franchot Gallo, MD;  Location: [facility];  Service: Urology;  Laterality: N/A;  . ENUCLEATION Left    '05 with prosthesis  . FLEXIBLE SIGMOIDOSCOPY N/A 09/26/2015   Procedure: FLEXIBLE SIGMOIDOSCOPY;  Surgeon: Laurence Spates, MD;  Location: [facility];  Service: Endoscopy;  Laterality: N/A;  . INSERTION OF SUPRAPUBIC CATHETER N/A 02/04/2014   Procedure: INSERTION OF SUPRAPUBIC CATHETER;  Surgeon: Franchot Gallo, MD;  Location: [facility];  Service: Urology;  Laterality: N/A;  . MUSCLE FLAP MYOCUTANEOUS / FASCIOCUTANEOUS OF TRUNK Left    '01  . PANCREAS SURGERY     '09- with partial liver resection/ pancreas "[place]"  . PARTIAL NEPHRECTOMY Right    '09- cancer  . PERIPHERALLY INSERTED CENTRAL CATHETER INSERTION     past hx.  . RECONSTRUCTION BREAST W/ TRAM FLAP      Allergies: Ciprocin-fluocin-procin [fluocinolone acetonide] and Other  Medications: Prior to Admission medications   Medication Sig Start Date End Date Taking? Authorizing Provider  ACCU-CHEK SMARTVIEW test strip  05/10/16   Historical Provider, MD  baclofen (LIORESAL) 20 MG tablet Take 1-2 tablets (  20-40 mg total) by mouth 4 (four) times daily. 04/15/16   Volanda Napoleon, MD  [facility]) OINT Apply topically as needed 03/17/16   Volanda Napoleon, MD  CREON 24000 units CPEP TAKE 3 CAPSULES BY MOUTH 3 TIMES DAILY WITH MEALS. 05/31/16   Volanda Napoleon, MD  diazepam (VALIUM) 5 MG tablet TAKE 1 TABLET  BY MOUTH 3 TIMES DAILY FOR SPASMS/ANXIETY 02/02/16   Volanda Napoleon, MD  doxazosin (CARDURA) 1 MG tablet Take 1 tablet (1 mg total) by mouth 2 (two) times daily. Patient taking differently: Take 1 mg by mouth 2 (two) times daily as needed (blood pressure spikes.).  09/03/14   Volanda Napoleon, MD  dronabinol (MARINOL) 5 MG capsule TAKE 1 CAPSULE BY MOUTH TWICE DAILY WITH MEALS 05/11/16   Volanda Napoleon, MD  everolimus (AFINITOR) 10 MG tablet Take 1 tablet (10 mg total) by mouth daily. 02/02/16   Volanda Napoleon, MD  gabapentin (NEURONTIN) 300 MG capsule Take 2 capsules (600 mg total) by mouth 3 (three) times daily. 01/15/16   Volanda Napoleon, MD  HYDROcodone-acetaminophen (NORCO/VICODIN) 5-325 MG tablet Take 1 tablet by mouth every 6 (six) hours as needed for moderate pain. 01/15/16   Volanda Napoleon, MD  HYDROmorphone (DILAUDID) 4 MG tablet Take 4 mg by mouth every 6 (six) hours as needed for severe pain.    Historical Provider, MD  insulin glargine (LANTUS) 100 UNIT/ML injection Inject 31 Units into the skin daily before breakfast.     Historical Provider, MD  insulin lispro (HUMALOG) 100 UNIT/ML injection Inject 3-9 Units into the skin 3 (three) times daily before meals. SS    Historical Provider, MD  midodrine (PROAMATINE) 5 MG tablet TKAE 1/2 TABLET BY MOUTH TWICE A DAY AS NEEDED Patient taking differently: TKAE 1/2 TABLET BY MOUTH TWICE A DAY AS NEEDED BLOOD PRESSURE. 08/18/15   Volanda Napoleon, MD  oxybutynin (DITROPAN) 5 MG tablet  05/10/16   Historical Provider, MD  promethazine (PHENERGAN) 12.5 MG tablet Take 12.5 mg by mouth every 6 (six) hours as needed for nausea.     Historical Provider, MD  traMADol (ULTRAM) 50 MG tablet TAKE 1 TABLET BY MOUTH 3 TIMES DAILY 05/25/16   Volanda Napoleon, MD     Family History  Problem Relation Age of Onset  . Heart failure Father     Social History   Social History  . Marital status: Married    Spouse name: N/A  . Number of children: N/A  . Years of  education: N/A   Social History Main Topics  . Smoking status: Former Smoker    Packs/day: 0.50    Years: 6.00    Types: Cigarettes    Start date: 01/15/1999    Quit date: 09/23/2008  . Smokeless tobacco: Never Used     Comment: quit 6 years ago  . Alcohol use 0.0 oz/week     Comment: only rare occasions  . Drug use: No  . Sexual activity: No   Other Topics Concern  . Not on file   Social History Narrative  . No narrative on file    ECOG Status: 2 - Symptomatic, <50% confined to bed  Review of Systems: A 12 point ROS discussed and pertinent positives are indicated in the HPI above.  All other systems are negative.  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  Eyes: No scleral icterus.  Cardiovascular: Normal rate, regular rhythm and normal heart sounds.  Exam reveals no friction rub.   No murmur heard. Pulmonary/Chest: Effort normal and breath sounds normal. No respiratory distress. He has no wheezes.  Abdominal: Soft. Bowel sounds are normal. He exhibits no distension and no mass. There is no tenderness. There is no guarding.  Skin: Skin is warm and dry. No rash noted. No erythema. No pallor.  Psychiatric: He has a normal mood and affect. His behavior is normal. Judgment and thought content normal.    Mallampati Score:    1   Imaging: No results found.  Labs:  CBC:  Recent Labs  04/14/16 1124 05/18/16 1046 05/27/16 1130 06/21/16 0837  WBC 9.7 9.9 8.7 8.9  HGB 12.7* 14.7 14.6 14.2  HCT 39.1 44.4 44.5 43.4  PLT 291 259 280 277    COAGS:  Recent Labs  05/27/16 1130  INR 1.11  APTT 31     BMP:  Recent Labs  09/24/15 1259 09/25/15 0250 09/26/15 0220 09/27/15 0518  03/16/16 0914 04/05/16 1611 04/14/16 1124 05/18/16 1046 06/21/16 0837  NA 137 138 142 138  < > 139 134* 133 133 134  K 3.5 3.5 3.3* 3.7  < > 4.5 4.0 3.7 4.4 3.6  CL 101 104 104 102  < > 98  --  96* 96* 100  CO2 27 27 29 29   < > 30 26 24 31 27   GLUCOSE 148* 256* 120* 144*  < > 109 206* 236* 261* 178*  BUN 16 13 10 10   < > 14 18.7 16 13 18   CALCIUM 8.2* 7.9* 8.2* 8.3*  < > 8.9 8.9 8.9 9.1 8.7  CREATININE 0.93 0.90 0.72 0.75  < > 0.7 0.9 0.9 0.7 0.4*  GFRNONAA >60 >60 >60 >60  --   --   --   --   --   --   GFRAA >60 >60 >60 >60  --   --   --   --   --   --   < > = values in this interval not displayed.  LIVER FUNCTION TESTS:  Recent Labs  04/05/16 1611 04/14/16 1124 05/18/16 1046 06/21/16 0837  BILITOT 0.76 0.70 0.70 0.80  AST 31 34 104* 33  ALT 26 25 55* 29  ALKPHOS 124 104* 149* 101*  PROT 7.2 6.8 7.3 6.9  ALBUMIN 3.2* 2.8* 3.2* 3.0*    TUMOR MARKERS:  Recent Labs  11/06/15 1315 12/02/15 1107 01/15/16 1348 02/12/16 1020  CHROMGRNA 23* 29* 23* 17*    Assessment and Plan:  Slight interval disease progression in the liver while receiving chemotherapy. Patient has a history of known metastatic neuroendocrine tumor to the liver. Repeat biopsy performed demonstrated no significant change in cellular type. Assessment of his imaging and labs reveals that he remains a candidate for Y 90 radio embolization. The procedure, risks, benefits and alternatives were reviewed with the patient. After discussion he would like to proceed with another treatment. This also was reviewed with our nuclear medicine medical staff who feel it is safe to give him additional Y 90 since his last  treatment was in May 2014.  Plan: Submitted information for Y 90 radio embolization authorization.  We'll schedule the procedure with Dr. Vernard Gambles in the next few weeks.  Thank you for this interesting consult.  I  greatly enjoyed meeting Scott A [employer]. and look forward to participating in their care.  A copy of this report was sent to the requesting provider on this date.  Electronically Signed: Greggory Keen 06/30/2016, 2:55 PM   I spent a total of    40 Minutes in face to face in clinical consultation, greater than 50% of which was counseling/coordinating care for this patient with von Hippel-Lindau syndrome and progression of hepatic neuroendocrine tumor metastases

## 2016-07-01 ENCOUNTER — Other Ambulatory Visit (HOSPITAL_COMMUNITY): Payer: Self-pay | Admitting: Interventional Radiology

## 2016-07-01 DIAGNOSIS — C7B8 Other secondary neuroendocrine tumors: Principal | ICD-10-CM

## 2016-07-01 DIAGNOSIS — C7A8 Other malignant neuroendocrine tumors: Secondary | ICD-10-CM

## 2016-07-05 MED FILL — LANTUS 100 UNITS/ML VIAL: 100 | 28 days supply | Qty: 10 | Fill #1

## 2016-07-05 MED FILL — OXYBUTYNIN 5 MG TABLET: 5 | 30 days supply | Qty: 60 | Fill #3

## 2016-07-06 ENCOUNTER — Other Ambulatory Visit (HOSPITAL_COMMUNITY): Payer: Self-pay | Admitting: Interventional Radiology

## 2016-07-06 DIAGNOSIS — C7B8 Other secondary neuroendocrine tumors: Principal | ICD-10-CM

## 2016-07-06 DIAGNOSIS — C7A8 Other malignant neuroendocrine tumors: Secondary | ICD-10-CM

## 2016-07-14 ENCOUNTER — Other Ambulatory Visit: Payer: Self-pay | Admitting: Physician Assistant

## 2016-07-14 ENCOUNTER — Other Ambulatory Visit: Payer: Self-pay | Admitting: General Surgery

## 2016-07-14 MED FILL — DRONABINOL 5 MG CAPSULE: 5 | 30 days supply | Qty: 60 | Fill #2

## 2016-07-15 ENCOUNTER — Ambulatory Visit (HOSPITAL_COMMUNITY)
Admission: RE | Admit: 2016-07-15 | Discharge: 2016-07-15 | Disposition: A | Discharge status: Home / Self Care | Payer: 59 | Source: Ambulatory Visit | Attending: Interventional Radiology | Admitting: Interventional Radiology

## 2016-07-15 ENCOUNTER — Other Ambulatory Visit (HOSPITAL_COMMUNITY): Payer: Self-pay | Admitting: Interventional Radiology

## 2016-07-15 ENCOUNTER — Encounter (HOSPITAL_COMMUNITY)
Admission: RE | Admit: 2016-07-15 | Discharge: 2016-07-15 | Disposition: A | Discharge status: Home / Self Care | Payer: 59 | Source: Ambulatory Visit | Attending: Interventional Radiology | Admitting: Interventional Radiology

## 2016-07-15 ENCOUNTER — Encounter (HOSPITAL_COMMUNITY): Payer: 59

## 2016-07-15 ENCOUNTER — Other Ambulatory Visit: Payer: Self-pay | Admitting: Interventional Radiology

## 2016-07-15 ENCOUNTER — Ambulatory Visit (HOSPITAL_COMMUNITY): Payer: 59

## 2016-07-15 ENCOUNTER — Encounter (HOSPITAL_COMMUNITY): Payer: Self-pay

## 2016-07-15 DIAGNOSIS — Z90411 Acquired partial absence of pancreas: Secondary | ICD-10-CM | POA: Diagnosis not present

## 2016-07-15 DIAGNOSIS — C787 Secondary malignant neoplasm of liver and intrahepatic bile duct: Secondary | ICD-10-CM | POA: Diagnosis not present

## 2016-07-15 DIAGNOSIS — I10 Essential (primary) hypertension: Secondary | ICD-10-CM | POA: Insufficient documentation

## 2016-07-15 DIAGNOSIS — C7A8 Other malignant neuroendocrine tumors: Secondary | ICD-10-CM | POA: Diagnosis not present

## 2016-07-15 DIAGNOSIS — D509 Iron deficiency anemia, unspecified: Secondary | ICD-10-CM | POA: Insufficient documentation

## 2016-07-15 DIAGNOSIS — C254 Malignant neoplasm of endocrine pancreas: Secondary | ICD-10-CM | POA: Diagnosis not present

## 2016-07-15 DIAGNOSIS — C7B8 Other secondary neuroendocrine tumors: Principal | ICD-10-CM

## 2016-07-15 DIAGNOSIS — Q858 Other phakomatoses, not elsewhere classified: Secondary | ICD-10-CM | POA: Diagnosis not present

## 2016-07-15 DIAGNOSIS — E089 Diabetes mellitus due to underlying condition without complications: Secondary | ICD-10-CM | POA: Insufficient documentation

## 2016-07-15 DIAGNOSIS — R51 Headache: Secondary | ICD-10-CM | POA: Diagnosis not present

## 2016-07-15 DIAGNOSIS — Z87891 Personal history of nicotine dependence: Secondary | ICD-10-CM | POA: Diagnosis not present

## 2016-07-15 DIAGNOSIS — C7A1 Malignant poorly differentiated neuroendocrine tumors: Secondary | ICD-10-CM | POA: Diagnosis not present

## 2016-07-15 HISTORY — PX: IR GENERIC HISTORICAL: IMG1180011

## 2016-07-15 LAB — COMPREHENSIVE METABOLIC PANEL
ALBUMIN: 3.8 g/dL (ref 3.5–5.0)
ALT: 29 U/L (ref 17–63)
AST: 40 U/L (ref 15–41)
Alkaline Phosphatase: 120 U/L (ref 38–126)
Anion gap: 10 (ref 5–15)
BUN: 18 mg/dL (ref 6–20)
CHLORIDE: 98 mmol/L — AB (ref 101–111)
CO2: 27 mmol/L (ref 22–32)
CREATININE: 0.72 mg/dL (ref 0.61–1.24)
Calcium: 9 mg/dL (ref 8.9–10.3)
GFR calc Af Amer: >60 mL/min (ref >60)
GLUCOSE: 227 mg/dL — AB (ref 65–99)
Potassium: 3.8 mmol/L (ref 3.5–5.1)
Sodium: 135 mmol/L (ref 135–145)
Total Bilirubin: 1.1 mg/dL (ref 0.3–1.2)
Total Protein: 7.8 g/dL (ref 6.5–8.1)

## 2016-07-15 LAB — CBC
HCT: 43.4 % (ref 39.0–52.0)
Hemoglobin: 13.8 g/dL (ref 13.0–17.0)
MCH: 27.4 pg (ref 26.0–34.0)
MCHC: 31.8 g/dL (ref 30.0–36.0)
MCV: 86.3 fL (ref 78.0–100.0)
PLATELETS: 260 10*3/uL (ref 150–400)
RBC: 5.03 MIL/uL (ref 4.22–5.81)
RDW: 13.6 % (ref 11.5–15.5)
WBC: 10.3 10*3/uL (ref 4.0–10.5)

## 2016-07-15 LAB — PROTIME-INR
INR: 1.11
Prothrombin Time: 14.4 seconds (ref 11.4–15.2)

## 2016-07-15 LAB — GLUCOSE, CAPILLARY: Glucose-Capillary: 241 mg/dL — ABNORMAL HIGH (ref 65–99)

## 2016-07-15 LAB — APTT: aPTT: 32 seconds (ref 24–36)

## 2016-07-15 MED ORDER — LIDOCAINE HCL 1 % IJ SOLN
INTRAMUSCULAR | Status: AC
  Filled 2016-07-15: qty 20

## 2016-07-15 MED ORDER — TECHNETIUM TO 99M ALBUMIN AGGREGATED
5.5000 | Freq: Once | INTRAVENOUS | Status: AC | PRN
  Administered 2016-07-15: 5.5 via INTRAVENOUS

## 2016-07-15 MED ORDER — IOPAMIDOL (ISOVUE-300) INJECTION 61%
105.0000 mL | Freq: Once | INTRAVENOUS | Status: AC | PRN
  Administered 2016-07-15: 105 mL via INTRA_ARTERIAL

## 2016-07-15 MED ORDER — FENTANYL CITRATE (PF) 100 MCG/2ML IJ SOLN
INTRAMUSCULAR | Status: DC | PRN
  Administered 2016-07-15: 50 ug via INTRAVENOUS

## 2016-07-15 MED ORDER — LIDOCAINE HCL 1 % IJ SOLN
INTRAMUSCULAR | Status: DC | PRN
  Administered 2016-07-15: 10 mL

## 2016-07-15 MED ORDER — MIDAZOLAM HCL 2 MG/2ML IJ SOLN
INTRAMUSCULAR | Status: AC
  Filled 2016-07-15: qty 6

## 2016-07-15 MED ORDER — FENTANYL CITRATE (PF) 100 MCG/2ML IJ SOLN
INTRAMUSCULAR | Status: AC
  Filled 2016-07-15: qty 4

## 2016-07-15 MED ORDER — SODIUM CHLORIDE 0.9 % IV SOLN
INTRAVENOUS | Status: DC
  Administered 2016-07-15: 08:00:00 via INTRAVENOUS

## 2016-07-15 MED ORDER — MIDAZOLAM HCL 2 MG/2ML IJ SOLN
INTRAMUSCULAR | Status: DC | PRN
  Administered 2016-07-15 (×3): 1 mg via INTRAVENOUS

## 2016-07-15 NOTE — Progress Notes (Signed)
Called pt's wife to let her know pt was ready for d/c.  Pt's wife states she will be there soon.

## 2016-07-15 NOTE — Sedation Documentation (Signed)
Pt transported to [facility] for post imaging.  

## 2016-07-15 NOTE — H&P (Signed)
Chief Complaint: right-sided liver lesion  Referring Physician:Dr. Burney Gauze  Supervising Physician: Arne Cleveland  Patient Status: Out-pt  HPI: [name]. is an 55 y.o. male with a history of von H ippel-Lindau syndrome.  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  He has had several Y-90 treatments on liver lesions in the past.  He recently underwent a liver biopsy which confirmed another lesion on the right side.  He saw Dr. Annamaria Boots in the office (Dr. Vernard Gambles was on vacation) and is currently here today for a pre Y-90 roadmapping of this lesion.  The patient has no new complaints.  He gets around right now with a motorized wheelchair.  He does have a chronic suprapubic foley in place with no issues.  He has no other new complaints.   Past Medical History:  Past Medical History:  Diagnosis Date  . Anemia 08/19/2014  . 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 Diabetes mellitus without complication ([place])   . Headache(784.0)    neuropathic pain usually behind eyes related to blood pressure  . HTN (hypertension) 08/19/2014  . Iron deficiency anemia, unspecified 07/10/2014  . 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 Orthostatic hypotension 08/19/2014  . Transfusion history    '09 .   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 Von Hippel-Lindau syndrome ([place])    genetic disease that causes tumors    Past Surgical History:  Past Surgical History:  Procedure Laterality Date  . BACK SURGERY     '91- T#-T5 "tumor resection"-benign  . CERVICAL SPINE SURGERY     '07- resection tumor C5-C7-"benign"  . 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 COLONOSCOPY WITH PROPOFOL N/A 08/20/2014   Procedure: COLONOSCOPY WITH PROPOFOL;  Surgeon: Cleotis Nipper, MD;  Location: [facility];  Service: Endoscopy;  Laterality: N/A;  . CRANIOTOMY FOR TUMOR     medulla tumor "benign  tumor"-salvage own bone for closure  . CYSTOSCOPY WITH LITHOLAPAXY N/A 02/04/2014   Procedure: CYSTOSCOPY WITH LITHOLAPAXY;  Surgeon: Franchot Gallo, MD;  Location: [facility];  Service: Urology;  Laterality: N/A;  . ENUCLEATION Left    '05 with prosthesis  . FLEXIBLE SIGMOIDOSCOPY N/A 09/26/2015   Procedure: FLEXIBLE SIGMOIDOSCOPY;  Surgeon: Laurence Spates, MD;  Location: [facility];  Service: Endoscopy;  Laterality: N/A;  . INSERTION OF SUPRAPUBIC CATHETER N/A 02/04/2014   Procedure: INSERTION OF SUPRAPUBIC CATHETER;  Surgeon: Franchot Gallo, MD;  Location: [facility];  Service: Urology;  Laterality: N/A;  . MUSCLE FLAP MYOCUTANEOUS / FASCIOCUTANEOUS OF TRUNK Left    '01  . PANCREAS SURGERY     '09- with partial liver resection/ pancreas "Whipple"  . PARTIAL NEPHRECTOMY Right    '09- cancer  . PERIPHERALLY INSERTED CENTRAL CATHETER INSERTION     past hx.  . RECONSTRUCTION BREAST W/ TRAM FLAP      Family History:  Family History  Problem Relation Age of Onset  . Heart failure Father     Social History:  reports that he quit smoking about 7 years ago. His smoking use included Cigarettes. He started smoking about 17 years ago. He has a 3.00 pack-year smoking history. He has never used smokeless tobacco. He reports that he drinks alcohol. He reports that he does not use drugs.  Allergies:  Allergies  Allergen Reactions  . Ciprocin-Fluocin-Procin [Fluocinolone Acetonide] Rash  Pt states this happened with IV Cipro. ABLE TO TAKE PO CIPRO.  . Other Rash    IV-CIPRO    Medications: Medications have been reviewed in Epic  Please HPI for pertinent positives, otherwise complete 10 system ROS negative.  Mallampati Score: MD Evaluation Airway: WNL Heart: WNL Abdomen: WNL Chest/ Lungs: WNL ASA  Classification: 3 Mallampati/Airway Score: Two  Physical Exam: BP (!) 155/94 (BP Location: Right Arm)   Pulse (!) 56   Temp 97.5 F (36.4 C) (Oral)   Resp 14   SpO2 99%  There is no  height or weight on file to calculate BMI. General: pleasant, WD, WN white male who is laying in bed in NAD HEENT: head is normocephalic, atraumatic.  Sclera are noninjected.  PERRL.  Ears and nose without any masses or lesions.  Mouth is pink but dry Heart: regular, rate, and rhythm.  Normal s1,s2. No obvious murmurs, gallops, or rubs noted.  Palpable radial and pedal pulses bilaterally Lungs: CTAB, no wheezes, rhonchi, or rales noted.  Respiratory effort nonlabored Abd: soft, NT, ND, +BS, no masses, hernias, or organomegaly.  Suprapubic catheter in place MS: atrophy of his lower extremities Psych: A&Ox3 with an appropriate affect.   Labs: Results for orders placed or performed during the hospital encounter of 07/15/16 (from the past 48 hour(s))  Comprehensive metabolic panel     Status: Abnormal   Collection Time: 07/15/16  7:45 AM  Result Value Ref Range   Sodium 135 135 - 145 mmol/L   Potassium 3.8 3.5 - 5.1 mmol/L   Chloride 98 (L) 101 - 111 mmol/L   CO2 27 22 - 32 mmol/L   Glucose, Bld 227 (H) 65 - 99 mg/dL   BUN 18 6 - 20 mg/dL   Creatinine, Ser 0.72 0.61 - 1.24 mg/dL   Calcium 9.0 8.9 - 10.3 mg/dL   Total Protein 7.8 6.5 - 8.1 g/dL   Albumin 3.8 3.5 - 5.0 g/dL   AST 40 15 - 41 U/L   ALT 29 17 - 63 U/L   Alkaline Phosphatase 120 38 - 126 U/L   Total Bilirubin 1.1 0.3 - 1.2 mg/dL   GFR calc non Af Amer >60 >60 mL/min   GFR calc Af Amer >60 >60 mL/min    Comment: (NOTE) The eGFR has been calculated using the CKD EPI equation. This calculation has not been validated in all clinical situations. eGFR's persistently <60 mL/min signify possible Chronic Kidney Disease.    Anion gap 10 5 - 15  APTT upon arrival     Status: None   Collection Time: 07/15/16  7:45 AM  Result Value Ref Range   aPTT 32 24 - 36 seconds  CBC upon arrival     Status: None   Collection Time: 07/15/16  7:45 AM  Result Value Ref Range   WBC 10.3 4.0 - 10.5 K/uL   RBC 5.03 4.22 - 5.81 MIL/uL    Hemoglobin 13.8 13.0 - 17.0 g/dL   HCT 43.4 39.0 - 52.0 %   MCV 86.3 78.0 - 100.0 fL   MCH 27.4 26.0 - 34.0 pg   MCHC 31.8 30.0 - 36.0 g/dL   RDW 13.6 11.5 - 15.5 %   Platelets 260 150 - 400 K/uL  Protime-INR upon arrival     Status: None   Collection Time: 07/15/16  7:45 AM  Result Value Ref Range   Prothrombin Time 14.4 11.4 - 15.2 seconds   INR 1.11     Imaging: No  results found.  Assessment/Plan 1. Neuroendocrine pancreatic cancer with liver mets -we will plan today to proceed with a pre Y-90 roadmapping.  He is already set up for his actual treatment on 8-23 if all goes well today. -his labs and vitals have been reviewed -Risks and Benefits discussed with the patient including, but not limited to bleeding, infection, vascular injury or contrast induced renal failure. All of the patient's questions were answered, patient is agreeable to proceed. Consent signed and in chart.   Thank you for this interesting consult.  I greatly enjoyed meeting Maude A [employer]. and look forward to participating in their care.  A copy of this report was sent to the requesting provider on this date.  Electronically Signed: Henreitta Cea 07/15/2016, 8:43 AM   I spent a total of    25 Minutes in face to face in clinical consultation, greater than 50% of which was counseling/coordinating care for metastatic pancreatic cancer, liver lesions

## 2016-07-15 NOTE — Progress Notes (Signed)
Pt wife arrived and d/c instructions were reviewed and pt was ready for d/c.

## 2016-07-15 NOTE — Discharge Instructions (Signed)
Angiogram, Care After These instructions give you information about caring for yourself after your procedure. Your doctor may also give you more specific instructions. Call your doctor if you have any problems or questions after your procedure.  HOME CARE  Take medicines only as told by your doctor.  Follow your doctor's instructions about:  Care of the area where the tube was inserted.  Bandage (dressing) changes and removal.  You may shower 24-48 hours after the procedure or as told by your doctor.  Do not take baths, swim, or use a hot tub until your doctor approves.  Every day, check the area where the tube was inserted. Watch for:  Redness, swelling, or pain.  Fluid, blood, or pus.  Do not apply powder or lotion to the site.  Do not lift anything that is heavier than 10 lb (4.5 kg) for 5 days or as told by your doctor.  Ask your doctor when you can:  Return to work or school.  Do physical activities or play sports.  Have sex.  Do not drive or operate heavy machinery for 24 hours or as told by your doctor.  Have someone with you for the first 24 hours after the procedure.  Keep all follow-up visits as told by your doctor. This is important. GET HELP IF:  You have a fever.   You have chills.   You have more bleeding from the area where the tube was inserted. Hold pressure on the area.  You have redness, swelling, or pain in the area where the tube was inserted.  You have fluid or pus coming from the area. GET HELP RIGHT AWAY IF:   You have a lot of pain in the area where the tube was inserted.  The area where the tube was inserted is bleeding, and the bleeding does not stop after 30 minutes of holding steady pressure on the area.  The area near or just beyond the insertion site becomes pale, cool, tingly, or numb.   This information is not intended to replace advice given to you by your health care provider. Make sure you discuss any questions you have  with your health care provider.   Document Released: 02/18/2009 Document Revised: 12/13/2014 Document Reviewed: 04/25/2013 Elsevier Interactive Patient Education 2016 [place] Radioembolization Discharge Instructions  You have been given a radioactive material during your procedure.  While it is safe for you to be discharged home from the hospital, you need to proceed directly home.    Do not use public transportation, including air travel, lasting more than 2 hours for 1 week.  Avoid crowded public places for 1 week.  Adult visitors should try to avoid close contact with you for 1 week.    Children and pregnant females should not visit or have close contact with you for 1 week.  Items that you touch are not radioactive.  Do not sleep in the same bed as your partner for 1 week, and a condom should be used for sexual activity during the first 24 hours.  Your blood may be radioactive and caution should be used if any bleeding occurs during the recovery period.  Body fluids may be radioactive for 24 hours.  Wash your hands after voiding.  Men should sit to urinate.  Dispose of any soiled materials (flush down toilet or place in trash at home) during the first day.  Drink 6 to 8 glasses of fluids per day for 5 days to hydrate yourself.  If you need to see a doctor during the first week, you must let them know that you were treated with yttrium-90 microspheres, and will be slightly radioactive.  They can call Interventional Radiology [phone] with any questions.Moderate Moderate Conscious Sedation, Adult, Care After Refer to this sheet in the next few weeks. These instructions provide you with information on caring for yourself after your procedure. Your health care provider may also give you more specific instructions. Your treatment has been planned according to current medical practices, but problems sometimes occur. Call your health care provider if you have any problems or  questions after your procedure. WHAT TO EXPECT AFTER THE PROCEDURE  After your procedure:  You may feel sleepy, clumsy, and have poor balance for several hours.  Vomiting may occur if you eat too soon after the procedure. HOME CARE INSTRUCTIONS  Do not participate in any activities where you could become injured for at least 24 hours. Do not:  Drive.  Swim.  Ride a bicycle.  Operate heavy machinery.  Cook.  Use power tools.  Climb ladders.  Work from a high place.  Do not make important decisions or sign legal documents until you are improved.  If you vomit, drink water, juice, or soup when you can drink without vomiting. Make sure you have little or no nausea before eating solid foods.  Only take over-the-counter or prescription medicines for pain, discomfort, or fever as directed by your health care provider.  Make sure you and your family fully understand everything about the medicines given to you, including what side effects may occur.  You should not drink alcohol, take sleeping pills, or take medicines that cause drowsiness for at least 24 hours.  If you smoke, do not smoke without supervision.  If you are feeling better, you may resume normal activities 24 hours after you were sedated.  Keep all appointments with your health care provider. SEEK MEDICAL CARE IF:  Your skin is pale or bluish in color.  You continue to feel nauseous or vomit.  Your pain is getting worse and is not helped by medicine.  You have bleeding or swelling.  You are still sleepy or feeling clumsy after 24 hours. SEEK IMMEDIATE MEDICAL CARE IF:  You develop a rash.  You have difficulty breathing.  You develop any type of allergic problem.  You have a fever. MAKE SURE YOU:  Understand these instructions.  Will watch your condition.  Will get help right away if you are not doing well or get worse.   This information is not intended to replace advice given to you by your  health care provider. Make sure you discuss any questions you have with your health care provider.   Document Released: 09/12/2013 Document Revised: 12/13/2014 Document Reviewed: 09/12/2013 Elsevier Interactive Patient Education [employer].

## 2016-07-15 NOTE — Sedation Documentation (Signed)
R fem artery puncture site dressed with bandaide, CDI, Level 0, 3+RDP.

## 2016-07-15 NOTE — Procedures (Signed)
Hepatic arteriogram and MAA IA injection for shunt calculation No complication No blood loss. See complete dictation in [facility].

## 2016-07-15 NOTE — Sedation Documentation (Signed)
5Fr sheath removed from R femoral artery by Dr. Vernard Gambles.  Hemostasis achieved using Exoseal closure device. R groin level 0, 3+RDP.

## 2016-07-16 ENCOUNTER — Encounter (HOSPITAL_COMMUNITY): Payer: 59

## 2016-07-16 LAB — AFP TUMOR MARKER: AFP TUMOR MARKER: 3.8 ng/mL (ref 0.0–8.3)

## 2016-07-16 LAB — CEA: CEA: 4.7 ng/mL (ref 0.0–4.7)

## 2016-07-19 MED FILL — ACCU-CHEK SMARTVIEW TEST ST: 25 days supply | Qty: 100 | Fill #2

## 2016-07-22 ENCOUNTER — Ambulatory Visit (HOSPITAL_BASED_OUTPATIENT_CLINIC_OR_DEPARTMENT_OTHER): Payer: 59

## 2016-07-22 ENCOUNTER — Encounter: Payer: Self-pay | Admitting: Hematology & Oncology

## 2016-07-22 ENCOUNTER — Ambulatory Visit (HOSPITAL_BASED_OUTPATIENT_CLINIC_OR_DEPARTMENT_OTHER): Payer: 59 | Admitting: Hematology & Oncology

## 2016-07-22 ENCOUNTER — Other Ambulatory Visit (HOSPITAL_BASED_OUTPATIENT_CLINIC_OR_DEPARTMENT_OTHER): Payer: 59

## 2016-07-22 DIAGNOSIS — Z23 Encounter for immunization: Secondary | ICD-10-CM

## 2016-07-22 DIAGNOSIS — C7A8 Other malignant neuroendocrine tumors: Secondary | ICD-10-CM

## 2016-07-22 DIAGNOSIS — C7B8 Other secondary neuroendocrine tumors: Secondary | ICD-10-CM | POA: Diagnosis not present

## 2016-07-22 DIAGNOSIS — N319 Neuromuscular dysfunction of bladder, unspecified: Secondary | ICD-10-CM

## 2016-07-22 LAB — CBC WITH DIFFERENTIAL (CANCER CENTER ONLY)
BASO#: 0 10*3/uL (ref 0.0–0.2)
BASO%: 0.3 % (ref 0.0–2.0)
EOS%: 1.5 % (ref 0.0–7.0)
Eosinophils Absolute: 0.1 10*3/uL (ref 0.0–0.5)
HEMATOCRIT: 43.1 % (ref 38.7–49.9)
HGB: 14.2 g/dL (ref 13.0–17.1)
LYMPH#: 1.8 10*3/uL (ref 0.9–3.3)
LYMPH%: 22.9 % (ref 14.0–48.0)
MCH: 28.4 pg (ref 28.0–33.4)
MCHC: 32.9 g/dL (ref 32.0–35.9)
MCV: 86 fL (ref 82–98)
MONO#: 0.5 10*3/uL (ref 0.1–0.9)
MONO%: 6.4 % (ref 0.0–13.0)
NEUT#: 5.4 10*3/uL (ref 1.5–6.5)
NEUT%: 68.9 % (ref 40.0–80.0)
Platelets: 248 10*3/uL (ref 145–400)
RBC: 5 10*6/uL (ref 4.20–5.70)
RDW: 13.4 % (ref 11.1–15.7)
WBC: 7.8 10*3/uL (ref 4.0–10.0)

## 2016-07-22 LAB — CMP (CANCER CENTER ONLY)
ALT(SGPT): 19 U/L (ref 10–47)
AST: 27 U/L (ref 11–38)
Albumin: 3.1 g/dL — ABNORMAL LOW (ref 3.3–5.5)
Alkaline Phosphatase: 94 U/L — ABNORMAL HIGH (ref 26–84)
BILIRUBIN TOTAL: 0.9 mg/dL (ref 0.20–1.60)
BUN, Bld: 12 mg/dL (ref 7–22)
CALCIUM: 9 mg/dL (ref 8.0–10.3)
CHLORIDE: 100 meq/L (ref 98–108)
CO2: 31 meq/L (ref 18–33)
Creat: 0.9 mg/dl (ref 0.6–1.2)
GLUCOSE: 164 mg/dL — AB (ref 73–118)
Potassium: 3.9 mEq/L (ref 3.3–4.7)
SODIUM: 135 meq/L (ref 128–145)
Total Protein: 7.2 g/dL (ref 6.4–8.1)

## 2016-07-22 MED ORDER — LANREOTIDE ACETATE 120 MG/0.5ML ~~LOC~~ SOLN
120.0000 mg | Freq: Once | SUBCUTANEOUS | Status: AC
  Administered 2016-07-22: 120 mg via SUBCUTANEOUS
  Filled 2016-07-22: qty 120

## 2016-07-22 MED ORDER — HYDROCODONE-ACETAMINOPHEN 5-325 MG PO TABS: 1.0000 | ORAL_TABLET | Freq: Four times a day (QID) | ORAL | 0 refills | Status: DC | PRN

## 2016-07-22 MED FILL — HYDROCODON-APAP 5-325: 5-325 | 11 days supply | Qty: 90 | Fill #0

## 2016-07-22 NOTE — Patient Instructions (Signed)
Lanreotide injection What is this medicine? LANREOTIDE (lan REE oh tide) is used to reduce blood levels of growth hormone in patients with a condition called acromegaly. It also works to slow or stop tumor growth in patients with gastroenteropancreatic neuroendocrine tumor (GEP-NET). This medicine may be used for other purposes; ask your health care provider or pharmacist if you have questions. What should I tell my health care provider before I take this medicine? They need to know if you have any of these conditions: -diabetes -gallbladder disease -heart disease -kidney disease -liver disease -an unusual or allergic reaction to lanreotide, other medicines, latex, foods, dyes, or preservatives -pregnant or trying to get pregnant -breast-feeding How should I use this medicine? This medicine is for injection under the skin. It is given by a health care professional in a hospital or clinic setting. Contact your pediatrician or health care professional regarding the use of this medicine in children. Special care may be needed. Overdosage: If you think you have taken too much of this medicine contact a poison control center or emergency room at once. NOTE: This medicine is only for you. Do not share this medicine with others. What if I miss a dose? It is important not to miss your dose. Call your doctor or health care professional if you are unable to keep an appointment. What may interact with this medicine? -bromocriptine -cyclosporine -medicines for diabetes, including insulin -medicines for heart disease or hypertension -quinidine This list may not describe all possible interactions. Give your health care provider a list of all the medicines, herbs, non-prescription drugs, or dietary supplements you use. Also tell them if you smoke, drink alcohol, or use illegal drugs. Some items may interact with your medicine. What should I watch for while using this medicine? Visit your doctor or  health care professional for regular checks on your progress. Your condition will be monitored carefully while you are receiving this medicine. This medicine may cause increases or decreases in blood sugar. Signs of high blood sugar include frequent urination, unusual thirst, flushed or dry skin, difficulty breathing, drowsiness, stomach ache, nausea, vomiting or dry mouth. Signs of low blood sugar include chills, cool, pale skin or cold sweats, drowsiness, extreme hunger, fast heartbeat, headache, nausea, nervousness or anxiety, shakiness, trembling, unsteadiness, tiredness, or weakness. Contact your doctor or health care professional right away if you experience any of these symptoms. What side effects may I notice from receiving this medicine? Side effects that you should report to your doctor or health care professional as soon as possible: -allergic reactions like skin rash, itching or hives, swelling of the face, lips, or tongue -changes in blood sugar -changes in heart rate -severe stomach pain Side effects that usually do not require medical attention (report to your doctor or health care professional if they continue or are bothersome): -diarrhea or constipation -gas or stomach pain -nausea, vomiting -pain, redness, swelling and irritation at site where injected This list may not describe all possible side effects. Call your doctor for medical advice about side effects. You may report side effects to FDA at 1-800-FDA-1088. Where should I keep my medicine? This drug is given in a hospital or clinic and will not be stored at home. NOTE: This sheet is a summary. It may not cover all possible information. If you have questions about this medicine, talk to your doctor, pharmacist, or health care provider.    2016, Elsevier/Gold Standard. (2013-11-21 17:43:04)

## 2016-07-22 NOTE — Progress Notes (Signed)
Hematology and Oncology Follow Up Visit  Cruise Belmont [id number] [id number] 55 y.o. 07/22/2016   Principle Diagnosis:   Metastatic neuroendocrine carcinoma  Von Hipple-Lindau Syndrome  Current Therapy:  Temodar/Xeloda - start 03/17/2016 - s/p c#3 - discontinued Somatuline 120mg  SQ q month     Interim History:  Mr.  [name] is back for followup. He actually looks quite good. He comes in by himself. Typically, his wife comes with him. However, she has to watch over their dogs.  He is being seen by radiology. I think they can do another intrahepatic therapy on him. I think this would be wonderful as this will be the best way to help to try to control his disease. So far, his disease has been progressive in his liver.  He says that he has looked into a clinical trial for neuro regeneration. There is one being offered out at [facility] in [place]. He is going to see if he qualifies for this. It would be nice if this could be considered for him to try to help with his paraplegia.  His last chromogranin A level was 3 back in July.  His blood sugars have been a little on the high side. He and his wife are really doing a good job and try to control these.  He has the chronic indwelling Foley catheter. This is suprapubic. I don't think he's had any issues with infections with this so far.  He has had no cough or shortness of breath. He has had no rashes. He has had no diarrhea.  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 He is quite happy about this improvement.   Currently, his overall performance status is ECOG 1.   Medications:  Current Outpatient Prescriptions:  .  ACCU-CHEK SMARTVIEW test strip, , Disp: , Rfl: 10 .  baclofen (LIORESAL) 20 MG tablet, Take 1-2 tablets (20-40 mg total) by mouth 4 (four) times daily., Disp: 120 each, Rfl: 3 .  Balsam [place]-Castor Oil (VENELEX) OINT, Apply topically as needed, Disp: 60 g,  Rfl: 4 .  CREON 24000 units CPEP, TAKE 3 CAPSULES BY MOUTH 3 TIMES DAILY WITH MEALS., Disp: 270 capsule, Rfl: 3 .  diazepam (VALIUM) 5 MG tablet, TAKE 1 TABLET BY MOUTH 3 TIMES DAILY FOR SPASMS/ANXIETY, Disp: 90 tablet, Rfl: 1 .  doxazosin (CARDURA) 1 MG tablet, Take 1 tablet (1 mg total) by mouth 2 (two) times daily. (Patient taking differently: Take 1 mg by mouth 2 (two) times daily as needed (blood pressure spikes.). ), Disp: 60 tablet, Rfl: 2 .  dronabinol (MARINOL) 5 MG capsule, TAKE 1 CAPSULE BY MOUTH TWICE DAILY WITH MEALS, Disp: 60 capsule, Rfl: 1 .  everolimus (AFINITOR) 10 MG tablet, Take 1 tablet (10 mg total) by mouth daily., Disp: 30 tablet, Rfl: 2 .  gabapentin (NEURONTIN) 300 MG capsule, Take 2 capsules (600 mg total) by mouth 3 (three) times daily., Disp: 180 capsule, Rfl: 6 .  HYDROcodone-acetaminophen (NORCO/VICODIN) 5-325 MG tablet, Take 1-2 tablets by mouth every 6 (six) hours as needed for moderate pain., Disp: 90 tablet, Rfl: 0 .  HYDROmorphone (DILAUDID) 4 MG tablet, Take 4 mg by mouth every 6 (six) hours as needed for severe pain., Disp: , Rfl:  .  insulin glargine (LANTUS) 100 UNIT/ML injection, Inject 31 Units into the skin daily before breakfast. , Disp: , Rfl:  .  insulin lispro (HUMALOG) 100 UNIT/ML injection, Inject 3-9 Units into  the skin 3 (three) times daily before meals. SS, Disp: , Rfl:  .  midodrine (PROAMATINE) 5 MG tablet, TKAE 1/2 TABLET BY MOUTH TWICE A DAY AS NEEDED (Patient taking differently: TKAE 1/2 TABLET BY MOUTH TWICE A DAY AS NEEDED BLOOD PRESSURE.), Disp: 60 tablet, Rfl: 6 .  oxybutynin (DITROPAN) 5 MG tablet, , Disp: , Rfl: 3 .  promethazine (PHENERGAN) 12.5 MG tablet, Take 12.5 mg by mouth every 6 (six) hours as needed for nausea. , Disp: , Rfl:  .  traMADol (ULTRAM) 50 MG tablet, TAKE 1 TABLET BY MOUTH 3 TIMES DAILY, Disp: 90 tablet, Rfl: 2  Allergies:  Allergies  Allergen Reactions  . Ciprocin-Fluocin-Procin [Fluocinolone Acetonide] Rash    Pt  states this happened with IV Cipro. ABLE TO TAKE PO CIPRO.  . Other Rash    IV-CIPRO    Past Medical History, Surgical history, Social history, and Family History were reviewed and updated.  Review of Systems: As above  Physical Exam:  height is 6' (1.829 m). His oral temperature is 97.4 F (36.3 C). His blood pressure is 107/71 and his pulse is 88. His respiration is 18.   Well-developed and well-nourished gentleman. He is paralyzed from the waist down. His head exam shows no ocular or oral lesions. He has no palpable cervical or supraclavicular lymph nodes. His lungs are clear. Cardiac exam regular rate and rhythm with no murmurs, rubs or bruits. Abdomen is soft. Has good bowel sounds. There is no fluid wave. He has a well-healed laparotomy scar is. He has no obvious abdominal mass. There is no obvious liver or spleen tip. Back exam shows a laminectomy scar in the neck and upper thoracic spine. Extremities shows no swelling in his lower legs. He has no strength secondary to the paralysis. Neurological exam is nonfocal. Skin exam shows no rashes.  Lab Results  Component Value Date   WBC 7.8 07/22/2016   HGB 14.2 07/22/2016   HCT 43.1 07/22/2016   MCV 86 07/22/2016   PLT 248 07/22/2016     Chemistry      Component Value Date/Time   NA 135 07/22/2016 1120   NA 134 (L) 04/05/2016 1611   K 3.9 07/22/2016 1120   K 4.0 04/05/2016 1611   CL 100 07/22/2016 1120   CO2 31 07/22/2016 1120   CO2 26 04/05/2016 1611   BUN 12 07/22/2016 1120   BUN 18.7 04/05/2016 1611   CREATININE 0.9 07/22/2016 1120   CREATININE 0.9 04/05/2016 1611      Component Value Date/Time   CALCIUM 9.0 07/22/2016 1120   CALCIUM 8.9 04/05/2016 1611   ALKPHOS 94 (H) 07/22/2016 1120   ALKPHOS 124 04/05/2016 1611   AST 27 07/22/2016 1120   AST 31 04/05/2016 1611   ALT 19 07/22/2016 1120   ALT 26 04/05/2016 1611   BILITOT 0.90 07/22/2016 1120   BILITOT 0.76 04/05/2016 1611         Impression and  Plan: [name] is 55 year old gentleman with von Hippel-Lindau syndrome. He has multiple neuroendocrine tumors. 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 He's had multiple spinal surgeries.   It sounds like he will have another intrahepatic therapy next week. I think this is the best way to go to try to help him.   He will get his Somatuline today.   I think it would be incredible if he could get involved with this neuro regeneration trial at [facility] in [place]. I know it would  be a long way for him to go but I think he would certainly be a great candidate if accepted.   I think we are probably get him back now in 6 weeks. If he has his procedure done next week, I want to give him a good 4 weeks or so to recover.   I spent about 40 minutes with him today.   Volanda Napoleon, MD 8/17/20174:00 PM

## 2016-07-23 LAB — CHROMOGRANIN A: Chromogranin A: 3 nmol/L (ref 0–5)

## 2016-07-26 MED FILL — MIDODRINE HCL 5 MG TABLET: 5 | 60 days supply | Qty: 60 | Fill #3

## 2016-07-26 MED FILL — GABAPENTIN 300 MG CAPSULE: 300 | 30 days supply | Qty: 180 | Fill #6

## 2016-07-27 ENCOUNTER — Other Ambulatory Visit: Payer: Self-pay | Admitting: Radiology

## 2016-07-27 ENCOUNTER — Other Ambulatory Visit: Payer: Self-pay | Admitting: General Surgery

## 2016-07-27 MED FILL — traMADol HCL 50 MG TABS: 50 | 30 days supply | Qty: 90 | Fill #2

## 2016-07-28 ENCOUNTER — Other Ambulatory Visit (HOSPITAL_COMMUNITY): Payer: Self-pay | Admitting: Interventional Radiology

## 2016-07-28 ENCOUNTER — Ambulatory Visit (HOSPITAL_COMMUNITY)
Admission: RE | Admit: 2016-07-28 | Discharge: 2016-07-28 | Disposition: A | Discharge status: Home / Self Care | Payer: 59 | Source: Ambulatory Visit | Attending: Interventional Radiology | Admitting: Interventional Radiology

## 2016-07-28 ENCOUNTER — Encounter (HOSPITAL_COMMUNITY): Payer: Self-pay

## 2016-07-28 ENCOUNTER — Encounter (HOSPITAL_COMMUNITY)
Admission: RE | Admit: 2016-07-28 | Discharge: 2016-07-28 | Disposition: A | Discharge status: Home / Self Care | Payer: 59 | Source: Ambulatory Visit | Attending: Interventional Radiology | Admitting: Interventional Radiology

## 2016-07-28 DIAGNOSIS — C7B02 Secondary carcinoid tumors of liver: Secondary | ICD-10-CM | POA: Diagnosis not present

## 2016-07-28 DIAGNOSIS — C787 Secondary malignant neoplasm of liver and intrahepatic bile duct: Secondary | ICD-10-CM | POA: Diagnosis not present

## 2016-07-28 DIAGNOSIS — Z905 Acquired absence of kidney: Secondary | ICD-10-CM | POA: Diagnosis not present

## 2016-07-28 DIAGNOSIS — C7A8 Other malignant neuroendocrine tumors: Secondary | ICD-10-CM

## 2016-07-28 DIAGNOSIS — C7B8 Other secondary neuroendocrine tumors: Principal | ICD-10-CM

## 2016-07-28 DIAGNOSIS — Z9221 Personal history of antineoplastic chemotherapy: Secondary | ICD-10-CM | POA: Insufficient documentation

## 2016-07-28 DIAGNOSIS — D3A8 Other benign neuroendocrine tumors: Secondary | ICD-10-CM | POA: Diagnosis not present

## 2016-07-28 HISTORY — PX: IR GENERIC HISTORICAL: IMG1180011

## 2016-07-28 LAB — CBC WITH DIFFERENTIAL/PLATELET
BASOS ABS: 0 10*3/uL (ref 0.0–0.1)
BASOS PCT: 0 %
EOS PCT: 2 %
Eosinophils Absolute: 0.1 10*3/uL (ref 0.0–0.7)
HEMATOCRIT: 43.1 % (ref 39.0–52.0)
Hemoglobin: 14.3 g/dL (ref 13.0–17.0)
Lymphocytes Relative: 24 %
Lymphs Abs: 1.8 10*3/uL (ref 0.7–4.0)
MCH: 28 pg (ref 26.0–34.0)
MCHC: 33.2 g/dL (ref 30.0–36.0)
MCV: 84.5 fL (ref 78.0–100.0)
MONO ABS: 0.6 10*3/uL (ref 0.1–1.0)
MONOS PCT: 8 %
NEUTROS ABS: 5 10*3/uL (ref 1.7–7.7)
Neutrophils Relative %: 66 %
PLATELETS: 256 10*3/uL (ref 150–400)
RBC: 5.1 MIL/uL (ref 4.22–5.81)
RDW: 13.3 % (ref 11.5–15.5)
WBC: 7.6 10*3/uL (ref 4.0–10.5)

## 2016-07-28 LAB — COMPREHENSIVE METABOLIC PANEL
ALBUMIN: 3.7 g/dL (ref 3.5–5.0)
ALT: 19 U/L (ref 17–63)
ANION GAP: 7 (ref 5–15)
AST: 30 U/L (ref 15–41)
Alkaline Phosphatase: 112 U/L (ref 38–126)
BILIRUBIN TOTAL: 0.7 mg/dL (ref 0.3–1.2)
BUN: 16 mg/dL (ref 6–20)
CALCIUM: 9 mg/dL (ref 8.9–10.3)
CHLORIDE: 103 mmol/L (ref 101–111)
CO2: 27 mmol/L (ref 22–32)
Creatinine, Ser: 0.96 mg/dL (ref 0.61–1.24)
GFR calc Af Amer: >60 mL/min (ref >60)
GLUCOSE: 174 mg/dL — AB (ref 65–99)
POTASSIUM: 3.7 mmol/L (ref 3.5–5.1)
Sodium: 137 mmol/L (ref 135–145)
TOTAL PROTEIN: 7.4 g/dL (ref 6.5–8.1)

## 2016-07-28 LAB — GLUCOSE, CAPILLARY
Glucose-Capillary: 186 mg/dL — ABNORMAL HIGH (ref 65–99)
Glucose-Capillary: 222 mg/dL — ABNORMAL HIGH (ref 65–99)

## 2016-07-28 LAB — PROTIME-INR
INR: 1.11
Prothrombin Time: 14.4 seconds (ref 11.4–15.2)

## 2016-07-28 MED ORDER — FENTANYL CITRATE (PF) 100 MCG/2ML IJ SOLN
INTRAMUSCULAR | Status: AC | PRN
  Administered 2016-07-28: 25 ug via INTRAVENOUS
  Administered 2016-07-28: 50 ug via INTRAVENOUS
  Administered 2016-07-28: 25 ug via INTRAVENOUS

## 2016-07-28 MED ORDER — KETOROLAC TROMETHAMINE 30 MG/ML IJ SOLN
30.0000 mg | Freq: Once | INTRAMUSCULAR | Status: AC
  Administered 2016-07-28: 30 mg via INTRAVENOUS

## 2016-07-28 MED ORDER — KETOROLAC TROMETHAMINE 30 MG/ML IJ SOLN
INTRAMUSCULAR | Status: AC
  Administered 2016-07-28: 30 mg via INTRAVENOUS
  Filled 2016-07-28: qty 1

## 2016-07-28 MED ORDER — DEXAMETHASONE SODIUM PHOSPHATE 10 MG/ML IJ SOLN: 20.0000 mg | Freq: Once | INTRAMUSCULAR | Status: DC

## 2016-07-28 MED ORDER — PIPERACILLIN-TAZOBACTAM 3.375 G IVPB
3.3750 g | INTRAVENOUS | Status: AC
  Administered 2016-07-28: 3.375 g via INTRAVENOUS
  Filled 2016-07-28 (×2): qty 50

## 2016-07-28 MED ORDER — DEXAMETHASONE SODIUM PHOSPHATE 10 MG/ML IJ SOLN
INTRAMUSCULAR | Status: AC
  Filled 2016-07-28: qty 2

## 2016-07-28 MED ORDER — SODIUM CHLORIDE 0.9 % IV SOLN
INTRAVENOUS | Status: DC
  Administered 2016-07-28: 08:00:00 via INTRAVENOUS

## 2016-07-28 MED ORDER — ONDANSETRON 8 MG/50ML IVPB (CHCC): 8.0000 mg | Freq: Once | INTRAVENOUS | Status: DC

## 2016-07-28 MED ORDER — LIDOCAINE HCL 1 % IJ SOLN
INTRAMUSCULAR | Status: AC | PRN
  Administered 2016-07-28: 5 mL

## 2016-07-28 MED ORDER — SODIUM CHLORIDE 0.9 % IV SOLN
8.0000 mg | Freq: Once | INTRAVENOUS | Status: AC
  Administered 2016-07-28: 8 mg via INTRAVENOUS
  Filled 2016-07-28: qty 4

## 2016-07-28 MED ORDER — MIDAZOLAM HCL 2 MG/2ML IJ SOLN
INTRAMUSCULAR | Status: AC
Start: 2016-07-28 — End: 2016-07-28
  Filled 2016-07-28: qty 6

## 2016-07-28 MED ORDER — ONDANSETRON HCL 4 MG/2ML IJ SOLN: 8.0000 mg | INTRAMUSCULAR | Status: DC

## 2016-07-28 MED ORDER — YTTRIUM 90 INJECTION: 28.5000 | INJECTION | Freq: Once | INTRAVENOUS | Status: DC

## 2016-07-28 MED ORDER — FAMOTIDINE IN NACL 20-0.9 MG/50ML-% IV SOLN
20.0000 mg | INTRAVENOUS | Status: AC
  Administered 2016-07-28: 20 mg via INTRAVENOUS
  Filled 2016-07-28 (×2): qty 50

## 2016-07-28 MED ORDER — MIDAZOLAM HCL 2 MG/2ML IJ SOLN
INTRAMUSCULAR | Status: AC | PRN
  Administered 2016-07-28 (×4): 1 mg via INTRAVENOUS

## 2016-07-28 MED ORDER — FENTANYL CITRATE (PF) 100 MCG/2ML IJ SOLN
INTRAMUSCULAR | Status: AC
  Filled 2016-07-28: qty 4

## 2016-07-28 MED ORDER — SODIUM CHLORIDE 0.9 % IV SOLN
20.0000 mg | Freq: Once | INTRAVENOUS | Status: AC
  Administered 2016-07-28: 20 mg via INTRAVENOUS
  Filled 2016-07-28: qty 2

## 2016-07-28 MED ORDER — ONDANSETRON 8 MG/NS 50 ML IVPB
8.0000 mg | Freq: Once | INTRAVENOUS | Status: DC
  Filled 2016-07-28: qty 8

## 2016-07-28 MED ORDER — LIDOCAINE HCL 1 % IJ SOLN
INTRAMUSCULAR | Status: AC
  Filled 2016-07-28: qty 20

## 2016-07-28 NOTE — Sedation Documentation (Signed)
5 Fr sheath removed from R femoral artery by Dr. Vernard Gambles. Hemostasis achieved using Exoseal closure device. Groin level 0, 3+RDP.

## 2016-07-28 NOTE — Procedures (Signed)
Rgiht hepatic Y90 transarterial radioembolization No complication No blood loss. See complete dictation in [facility].

## 2016-07-28 NOTE — Discharge Instructions (Signed)
[facility]Post Y-90 Radioembolization Discharge Instructions  You have been given a radioactive material during your procedure.  While it is safe for you to be discharged home from the hospital, you need to proceed directly home.    Do not use public transportation, including air travel, lasting more than 2 hours for 1 week.  Avoid crowded public places for 1 week.  Adult visitors should try to avoid close contact with you for 1 week.    Children and pregnant females should not visit or have close contact with you for 1 week.  Items that you touch are not radioactive.  Do not sleep in the same bed as your partner for 1 week, and a condom should be used for sexual activity during the first 24 hours.  Your blood may be radioactive and caution should be used if any bleeding occurs during the recovery period.  Body fluids may be radioactive for 24 hours.  Wash your hands after voiding.  Men should sit to urinate.  Dispose of any soiled materials (flush down toilet or place in trash at home) during the first day.  Drink 6 to 8 glasses of fluids per day for 5 days to hydrate yourself.  If you need to see a doctor during the first week, you must let them know that you were treated with yttrium-90 microspheres, and will be slightly radioactive.  They can call Interventional Radiology [phone] with any questions.  Angiogram, Care After These instructions give you information about caring for yourself after your procedure. Your doctor may also give you more specific instructions. Call your doctor if you have any problems or questions after your procedure.  HOME CARE  Take medicines only as told by your doctor.  Follow your doctor's instructions about:  Care of the area where the tube was inserted.  Bandage (dressing) changes and removal.  You may shower 24-48 hours after the procedure or as told by your doctor.  Do not take baths, swim, or use a hot tub until your doctor approves.  Every day,  check the area where the tube was inserted. Watch for:  Redness, swelling, or pain.  Fluid, blood, or pus.  Do not apply powder or lotion to the site.  Do not lift anything that is heavier than 10 lb (4.5 kg) for 5 days or as told by your doctor.  Ask your doctor when you can:  Return to work or school.  Do physical activities or play sports.  Have sex.  Do not drive or operate heavy machinery for 24 hours or as told by your doctor.  Have someone with you for the first 24 hours after the procedure.  Keep all follow-up visits as told by your doctor. This is important. GET HELP IF:  You have a fever.   You have chills.   You have more bleeding from the area where the tube was inserted. Hold pressure on the area.  You have redness, swelling, or pain in the area where the tube was inserted.  You have fluid or pus coming from the area. GET HELP RIGHT AWAY IF:   You have a lot of pain in the area where the tube was inserted.  The area where the tube was inserted is bleeding, and the bleeding does not stop after 30 minutes of holding steady pressure on the area.  The area near or just beyond the insertion site becomes pale, cool, tingly, or numb.   This information is not intended to replace advice  given to you by your health care provider. Make sure you discuss any questions you have with your health care provider.   Document Released: 02/18/2009 Document Revised: 12/13/2014 Document Reviewed: 04/25/2013 Elsevier Interactive Patient Education 2016 [employer]  Moderate Conscious Sedation, Adult, Care After Refer to this sheet in the next few weeks. These instructions provide you with information on caring for yourself after your procedure. Your health care provider may also give you more specific instructions. Your treatment has been planned according to current medical practices, but problems sometimes occur. Call your health care provider if you have any problems or  questions after your procedure. WHAT TO EXPECT AFTER THE PROCEDURE  After your procedure:  You may feel sleepy, clumsy, and have poor balance for several hours.  Vomiting may occur if you eat too soon after the procedure. HOME CARE INSTRUCTIONS  Do not participate in any activities where you could become injured for at least 24 hours. Do not:  Drive.  Swim.  Ride a bicycle.  Operate heavy machinery.  Cook.  Use power tools.  Climb ladders.  Work from a high place.  Do not make important decisions or sign legal documents until you are improved.  If you vomit, drink water, juice, or soup when you can drink without vomiting. Make sure you have little or no nausea before eating solid foods.  Only take over-the-counter or prescription medicines for pain, discomfort, or fever as directed by your health care provider.  Make sure you and your family fully understand everything about the medicines given to you, including what side effects may occur.  You should not drink alcohol, take sleeping pills, or take medicines that cause drowsiness for at least 24 hours.  If you smoke, do not smoke without supervision.  If you are feeling better, you may resume normal activities 24 hours after you were sedated.  Keep all appointments with your health care provider. SEEK MEDICAL CARE IF:  Your skin is pale or bluish in color.  You continue to feel nauseous or vomit.  Your pain is getting worse and is not helped by medicine.  You have bleeding or swelling.  You are still sleepy or feeling clumsy after 24 hours. SEEK IMMEDIATE MEDICAL CARE IF:  You develop a rash.  You have difficulty breathing.  You develop any type of allergic problem.  You have a fever. MAKE SURE YOU:  Understand these instructions.  Will watch your condition.  Will get help right away if you are not doing well or get worse.   This information is not intended to replace advice given to you by your  health care provider. Make sure you discuss any questions you have with your health care provider.   Document Released: 09/12/2013 Document Revised: 12/13/2014 Document Reviewed: 09/12/2013 Elsevier Interactive Patient Education [employer].

## 2016-07-28 NOTE — Sedation Documentation (Signed)
Pt transported to Short Stay for recovery. 

## 2016-07-28 NOTE — Progress Notes (Signed)
Patient ID: [name]., male   DOB: 1961/03/11, 55 y.o.   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 He is well-known to our service. He has had extensive abdominal surgery including previous pancreatectomy, left hepatectomy and partial right nephrectomy. He has been undergoing maintenance chemotherapy for the metastatic disease to the liver. Previously, he received Y 90 hepatic radio embolization (right lobe- 07/2010, 11/2012; left lobe -08/2010, 04/2013) . Most recent surveillance imaging demonstrates slight progression with increase in the size of the lesions but overall stable in number in the residual right hepatic lobe. This has occurred despite the chemotherapy. He presents again today following recent hepatic arterial mapping study for repeat right hepatic Y 90 radio embolization. He currently denies fever, headache, chest pain, dyspnea, cough, abdominal/back pain, nausea, vomiting or abnormal bleeding.  Allergies: Ciprocin-fluocin-procin [fluocinolone acetonide] and Other  Medications: Prior to Admission medications   Medication Sig Start Date End Date Taking? Authorizing Provider  baclofen (LIORESAL) 20 MG tablet Take 1-2 tablets (20-40 mg total) by mouth 4 (four) times daily. 04/15/16  Yes Volanda Napoleon, MD  CREON 24000 units CPEP TAKE 3 CAPSULES BY MOUTH 3 TIMES DAILY WITH MEALS. 05/31/16  Yes Volanda Napoleon, MD  diazepam (VALIUM) 5 MG tablet TAKE 1 TABLET BY MOUTH 3 TIMES DAILY FOR SPASMS/ANXIETY 02/02/16  Yes Volanda Napoleon, MD  dronabinol (MARINOL) 5 MG capsule TAKE 1 CAPSULE BY MOUTH TWICE DAILY WITH MEALS 05/11/16  Yes Volanda Napoleon, MD  everolimus (AFINITOR) 10 MG tablet Take 1 tablet (10 mg total) by mouth daily. 02/02/16  Yes Volanda Napoleon, MD   gabapentin (NEURONTIN) 300 MG capsule Take 2 capsules (600 mg total) by mouth 3 (three) times daily. 01/15/16  Yes Volanda Napoleon, MD  HYDROcodone-acetaminophen (NORCO/VICODIN) 5-325 MG tablet Take 1-2 tablets by mouth every 6 (six) hours as needed for moderate pain. 07/22/16  Yes Volanda Napoleon, MD  HYDROmorphone (DILAUDID) 4 MG tablet Take 4 mg by mouth every 6 (six) hours as needed for severe pain.   Yes Historical Provider, MD  insulin glargine (LANTUS) 100 UNIT/ML injection Inject 31 Units into the skin daily before breakfast.    Yes Historical Provider, MD  insulin lispro (HUMALOG) 100 UNIT/ML injection Inject 3-9 Units into the skin 3 (three) times daily before meals. SS   Yes Historical Provider, MD  midodrine (PROAMATINE) 5 MG tablet TKAE 1/2 TABLET BY MOUTH TWICE A DAY AS NEEDED Patient taking differently: TKAE 1/2 TABLET BY MOUTH TWICE A DAY AS NEEDED BLOOD PRESSURE. 08/18/15  Yes Volanda Napoleon, MD  oxybutynin (DITROPAN) 5 MG tablet  05/10/16  Yes Historical Provider, MD  promethazine (PHENERGAN) 12.5 MG tablet Take 12.5 mg by mouth every 6 (six) hours as needed for nausea.    Yes Historical Provider, MD  traMADol (ULTRAM) 50 MG tablet TAKE 1 TABLET BY MOUTH 3 TIMES DAILY 05/25/16  Yes Volanda Napoleon, MD  ACCU-CHEK SMARTVIEW test strip  05/10/16   Historical Provider, MD  Janne Lab Oil [facility]) OINT Apply topically as needed 03/17/16   Volanda Napoleon, MD  doxazosin (CARDURA) 1 MG tablet Take 1 tablet (1 mg total) by mouth 2 (two) times daily. Patient taking differently: Take 1 mg by mouth 2 (two) times daily as needed (blood  pressure spikes.).  09/03/14   Volanda Napoleon, MD     Vital Signs: BP 136/98  HR 71  R 18  TEMP 98.3   O2 SATS 100% RA    Physical Exam awake, alert. 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 Chest clear to auscultation bilaterally. Heart with regular rate and rhythm. Abdomen soft, positive bowel  sounds, intact suprapubic catheter. Lower extremity atrophy.  Imaging: No results found.  Labs:  CBC:  Recent Labs  06/21/16 0837 07/15/16 0745 07/22/16 1120 07/28/16 0740  WBC 8.9 10.3 7.8 7.6  HGB 14.2 13.8 14.2 14.3  HCT 43.4 43.4 43.1 43.1  PLT 277 260 248 256    COAGS:  Recent Labs  05/27/16 1130 07/15/16 0745 07/28/16 0740  INR 1.11 1.11 1.11  APTT 31 32  --     BMP:  Recent Labs  09/25/15 0250 09/26/15 0220 09/27/15 0518  05/18/16 1046 06/21/16 0837 07/15/16 0745 07/22/16 1120  NA 138 142 138  < > 133 134 135 135  K 3.5 3.3* 3.7  < > 4.4 3.6 3.8 3.9  CL 104 104 102  < > 96* 100 98* 100  CO2 27 29 29   < > 31 27 27 31   GLUCOSE 256* 120* 144*  < > 261* 178* 227* 164*  BUN 13 10 10   < > 13 18 18 12   CALCIUM 7.9* 8.2* 8.3*  < > 9.1 8.7 9.0 9.0  CREATININE 0.90 0.72 0.75  < > 0.7 0.4* 0.72 0.9  GFRNONAA >60 >60 >60  --   --   --  >60  --   GFRAA >60 >60 >60  --   --   --  >60  --   < > = values in this interval not displayed.  LIVER FUNCTION TESTS:  Recent Labs  05/18/16 1046 06/21/16 0837 07/15/16 0745 07/22/16 1120  BILITOT 0.70 0.80 1.1 0.90  AST 104* 33 40 27  ALT 55* 29 29 19   ALKPHOS 149* 101* 120 94*  PROT 7.3 6.9 7.8 7.2  ALBUMIN 3.2* 3.0* 3.8 3.1*    Assessment and Plan: Patient with history of metastatic neuroendocrine carcinoma to liver, status post chemotherapy and multiple hepatic Y 90 radio embolizations in past. Now with slight interval disease progression in the liver on recent imaging. He presents again today for repeat right hepatic lobe Y 90 radio embolization.Risks and benefits discussed with the patient including, but not limited to bleeding, infection, nontarget embolization, vascular injury or contrast induced renal failure. All of the patient's questions were answered, patient is agreeable to proceed.Consent signed and in chart.   Electronically Signed: D. Rowe Robert 07/28/2016, 8:16 AM   I spent a total of 20  minutes at the the patient's bedside AND on the patient's hospital floor or unit, greater than 50% of which was counseling/coordinating care for right hepatic lobe Y 90 radio embolization

## 2016-07-28 NOTE — Sedation Documentation (Signed)
Gauze tegaderm bandage applied to R femoral artery puncture site. Drsg CDI.

## 2016-07-28 NOTE — Sedation Documentation (Signed)
Pt transported to nuc med for post imaging.

## 2016-07-29 LAB — CHROMOGRANIN A: Chromogranin A: 2 nmol/L (ref 0–5)

## 2016-08-06 MED FILL — CREON DR 24,000 UNITS CAP: 24000-76000 | 30 days supply | Qty: 270 | Fill #2

## 2016-08-06 MED FILL — LANTUS 100 UNITS/ML VIAL: 100 | 28 days supply | Qty: 10 | Fill #2

## 2016-08-10 ENCOUNTER — Other Ambulatory Visit: Payer: Self-pay | Admitting: Family

## 2016-08-12 ENCOUNTER — Other Ambulatory Visit (HOSPITAL_COMMUNITY): Payer: Self-pay | Admitting: Interventional Radiology

## 2016-08-12 DIAGNOSIS — C7A8 Other malignant neuroendocrine tumors: Secondary | ICD-10-CM

## 2016-08-12 DIAGNOSIS — C7B8 Other secondary neuroendocrine tumors: Principal | ICD-10-CM

## 2016-08-12 MED FILL — BACLOFEN 20 MG TABLET: 20 | 15 days supply | Qty: 120 | Fill #3

## 2016-08-16 MED FILL — DRONABINOL 5 MG CAPSULE: 5 | 30 days supply | Qty: 60 | Fill #3

## 2016-08-20 ENCOUNTER — Other Ambulatory Visit: Payer: Self-pay | Admitting: Hematology & Oncology

## 2016-08-20 MED FILL — diazePAM 5 MG TABS: 5 | 30 days supply | Qty: 90 | Fill #0

## 2016-08-25 ENCOUNTER — Other Ambulatory Visit: Payer: Self-pay | Admitting: Hematology & Oncology

## 2016-08-25 DIAGNOSIS — C7A8 Other malignant neuroendocrine tumors: Secondary | ICD-10-CM

## 2016-08-25 DIAGNOSIS — Z23 Encounter for immunization: Secondary | ICD-10-CM

## 2016-08-25 MED FILL — GABAPENTIN 300 MG CAPSULE: 300 | 30 days supply | Qty: 180 | Fill #0

## 2016-08-30 ENCOUNTER — Other Ambulatory Visit: Payer: Self-pay | Admitting: Hematology & Oncology

## 2016-08-30 MED FILL — traMADol HCL 50 MG TABS: 50 | 30 days supply | Qty: 90 | Fill #0

## 2016-08-30 MED FILL — ACCU-CHEK SMARTVIEW TEST ST: 25 days supply | Qty: 100 | Fill #3

## 2016-09-02 ENCOUNTER — Ambulatory Visit (HOSPITAL_BASED_OUTPATIENT_CLINIC_OR_DEPARTMENT_OTHER): Payer: 59 | Admitting: Hematology & Oncology

## 2016-09-02 ENCOUNTER — Encounter: Payer: Self-pay | Admitting: Hematology & Oncology

## 2016-09-02 ENCOUNTER — Other Ambulatory Visit (HOSPITAL_BASED_OUTPATIENT_CLINIC_OR_DEPARTMENT_OTHER): Payer: 59

## 2016-09-02 ENCOUNTER — Ambulatory Visit (HOSPITAL_BASED_OUTPATIENT_CLINIC_OR_DEPARTMENT_OTHER): Payer: 59

## 2016-09-02 VITALS — BP 147/87 | HR 118 | Temp 97.7°F | Resp 20 | Ht 72.0 in | Wt 165.0 lb

## 2016-09-02 DIAGNOSIS — C7A8 Other malignant neuroendocrine tumors: Secondary | ICD-10-CM | POA: Diagnosis not present

## 2016-09-02 DIAGNOSIS — Z23 Encounter for immunization: Secondary | ICD-10-CM | POA: Diagnosis not present

## 2016-09-02 DIAGNOSIS — C7B8 Other secondary neuroendocrine tumors: Secondary | ICD-10-CM

## 2016-09-02 DIAGNOSIS — Z8719 Personal history of other diseases of the digestive system: Secondary | ICD-10-CM

## 2016-09-02 DIAGNOSIS — N319 Neuromuscular dysfunction of bladder, unspecified: Secondary | ICD-10-CM

## 2016-09-02 LAB — CBC WITH DIFFERENTIAL (CANCER CENTER ONLY)
BASO#: 0 10*3/uL (ref 0.0–0.2)
BASO%: 0.2 % (ref 0.0–2.0)
EOS ABS: 0.1 10*3/uL (ref 0.0–0.5)
EOS%: 1.3 % (ref 0.0–7.0)
HCT: 43.2 % (ref 38.7–49.9)
HGB: 13.9 g/dL (ref 13.0–17.1)
LYMPH#: 1.3 10*3/uL (ref 0.9–3.3)
LYMPH%: 13.9 % — ABNORMAL LOW (ref 14.0–48.0)
MCH: 26.7 pg — AB (ref 28.0–33.4)
MCHC: 32.2 g/dL (ref 32.0–35.9)
MCV: 83 fL (ref 82–98)
MONO#: 0.9 10*3/uL (ref 0.1–0.9)
MONO%: 9.6 % (ref 0.0–13.0)
NEUT#: 7.1 10*3/uL — ABNORMAL HIGH (ref 1.5–6.5)
NEUT%: 75 % (ref 40.0–80.0)
Platelets: 279 10*3/uL (ref 145–400)
RBC: 5.21 10*6/uL (ref 4.20–5.70)
RDW: 13.2 % (ref 11.1–15.7)
WBC: 9.4 10*3/uL (ref 4.0–10.0)

## 2016-09-02 LAB — CMP (CANCER CENTER ONLY)
ALBUMIN: 3 g/dL — AB (ref 3.3–5.5)
ALT(SGPT): 26 U/L (ref 10–47)
AST: 35 U/L (ref 11–38)
Alkaline Phosphatase: 119 U/L — ABNORMAL HIGH (ref 26–84)
BILIRUBIN TOTAL: 0.8 mg/dL (ref 0.20–1.60)
BUN, Bld: 16 mg/dL (ref 7–22)
CALCIUM: 8.7 mg/dL (ref 8.0–10.3)
CHLORIDE: 96 meq/L — AB (ref 98–108)
CO2: 29 meq/L (ref 18–33)
CREATININE: 1.2 mg/dL (ref 0.6–1.2)
GLUCOSE: 180 mg/dL — AB (ref 73–118)
Potassium: 3.3 mEq/L (ref 3.3–4.7)
SODIUM: 130 meq/L (ref 128–145)
Total Protein: 7 g/dL (ref 6.4–8.1)

## 2016-09-02 LAB — LACTATE DEHYDROGENASE: LDH: 149 U/L (ref 125–245)

## 2016-09-02 MED ORDER — LANREOTIDE ACETATE 120 MG/0.5ML ~~LOC~~ SOLN
SUBCUTANEOUS | Status: AC
  Filled 2016-09-02: qty 120

## 2016-09-02 MED ORDER — LANREOTIDE ACETATE 120 MG/0.5ML ~~LOC~~ SOLN
120.0000 mg | Freq: Once | SUBCUTANEOUS | Status: AC
  Administered 2016-09-02: 120 mg via SUBCUTANEOUS

## 2016-09-02 NOTE — Patient Instructions (Signed)
Lanreotide injection What is this medicine? LANREOTIDE (lan REE oh tide) is used to reduce blood levels of growth hormone in patients with a condition called acromegaly. It also works to slow or stop tumor growth in patients with gastroenteropancreatic neuroendocrine tumor (GEP-NET). This medicine may be used for other purposes; ask your health care provider or pharmacist if you have questions. What should I tell my health care provider before I take this medicine? They need to know if you have any of these conditions: -diabetes -gallbladder disease -heart disease -kidney disease -liver disease -an unusual or allergic reaction to lanreotide, other medicines, latex, foods, dyes, or preservatives -pregnant or trying to get pregnant -breast-feeding How should I use this medicine? This medicine is for injection under the skin. It is given by a health care professional in a hospital or clinic setting. Contact your pediatrician or health care professional regarding the use of this medicine in children. Special care may be needed. Overdosage: If you think you have taken too much of this medicine contact a poison control center or emergency room at once. NOTE: This medicine is only for you. Do not share this medicine with others. What if I miss a dose? It is important not to miss your dose. Call your doctor or health care professional if you are unable to keep an appointment. What may interact with this medicine? -bromocriptine -cyclosporine -medicines for diabetes, including insulin -medicines for heart disease or hypertension -quinidine This list may not describe all possible interactions. Give your health care provider a list of all the medicines, herbs, non-prescription drugs, or dietary supplements you use. Also tell them if you smoke, drink alcohol, or use illegal drugs. Some items may interact with your medicine. What should I watch for while using this medicine? Visit your doctor or  health care professional for regular checks on your progress. Your condition will be monitored carefully while you are receiving this medicine. This medicine may cause increases or decreases in blood sugar. Signs of high blood sugar include frequent urination, unusual thirst, flushed or dry skin, difficulty breathing, drowsiness, stomach ache, nausea, vomiting or dry mouth. Signs of low blood sugar include chills, cool, pale skin or cold sweats, drowsiness, extreme hunger, fast heartbeat, headache, nausea, nervousness or anxiety, shakiness, trembling, unsteadiness, tiredness, or weakness. Contact your doctor or health care professional right away if you experience any of these symptoms. What side effects may I notice from receiving this medicine? Side effects that you should report to your doctor or health care professional as soon as possible: -allergic reactions like skin rash, itching or hives, swelling of the face, lips, or tongue -changes in blood sugar -changes in heart rate -severe stomach pain Side effects that usually do not require medical attention (report to your doctor or health care professional if they continue or are bothersome): -diarrhea or constipation -gas or stomach pain -nausea, vomiting -pain, redness, swelling and irritation at site where injected This list may not describe all possible side effects. Call your doctor for medical advice about side effects. You may report side effects to FDA at 1-800-FDA-1088. Where should I keep my medicine? This drug is given in a hospital or clinic and will not be stored at home. NOTE: This sheet is a summary. It may not cover all possible information. If you have questions about this medicine, talk to your doctor, pharmacist, or health care provider.    2016, Elsevier/Gold Standard. (2013-11-21 17:43:04)

## 2016-09-03 ENCOUNTER — Telehealth: Payer: Self-pay | Admitting: *Deleted

## 2016-09-03 ENCOUNTER — Encounter: Payer: Self-pay | Admitting: Interventional Radiology

## 2016-09-03 LAB — VITAMIN B12

## 2016-09-03 LAB — CHROMOGRANIN A: CHROMOGRAN A: 3 nmol/L (ref 0–5)

## 2016-09-03 MED ORDER — FLUCONAZOLE 100 MG PO TABS: 100.0000 mg | ORAL_TABLET | Freq: Every day | ORAL | 4 refills | Status: DC

## 2016-09-03 NOTE — Telephone Encounter (Addendum)
Patient aware of results  ----- Message from Volanda Napoleon, MD sent at 09/03/2016  9:45 AM EDT ----- Call - normal Vit B12 level!!  pete

## 2016-09-03 NOTE — Progress Notes (Signed)
Hematology and Oncology Follow Up Visit  [name] [id number] 04-27-1961 55 y.o. 09/03/2016   Principle Diagnosis:   Metastatic neuroendocrine carcinoma  Von Hipple-Lindau Syndrome  Current Therapy:  Temodar/Xeloda - start 03/17/2016 - s/p c#3 - discontinued Somatuline 120mg  SQ q month S/P Y-90 intrahepatic therapy - 07/28/2016     Interim History:  [name] is back for followup. He had another intrahepatic therapy with Y-90. This was back about a month or so ago. He tolerated this pretty well.  He seems to be doing pretty well right now. He's had no abdominal pain. He's had no spasms. He's had no urinary tract infection. He thinks may have a little bit of fungus in the urine. We will call in a Diflucan prescription for him.  His appetite is okay. He does have diabetes. He is on insulin.  He's had no neurological issues. 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 He's had multiple surgeries for these. 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  He's had no bleeding. He's had no fever. He's had no cough or shortness of breath.  He has the chronic indwelling Foley catheter. This is suprapubic. I don't think he's had any issues with infections    Currently, his overall performance status is ECOG 1.   Medications:  Current Outpatient Prescriptions:  .  ACCU-CHEK SMARTVIEW test strip, , Disp: , Rfl: 10 .  baclofen (LIORESAL) 20 MG tablet, Take 1-2 tablets (20-40 mg total) by mouth 4 (four) times daily., Disp: 120 each, Rfl: 3 .  Balsam [place]-Castor Oil (VENELEX) OINT, Apply topically as needed, Disp: 60 g, Rfl: 4 .  CREON 24000 units CPEP, TAKE 3 CAPSULES BY MOUTH 3 TIMES DAILY WITH MEALS., Disp: 270 capsule, Rfl: 3 .  diazepam (VALIUM) 5 MG tablet, TAKE 1 TABLET BY MOUTH 3 TIMES DAILY FOR SPASMS/ANXIETY, Disp: 90 tablet, Rfl: 1 .  doxazosin (CARDURA) 1 MG tablet, Take 1 tablet (1 mg total) by mouth 2 (two) times daily. (Patient taking differently: Take 1 mg by mouth 2  (two) times daily as needed (blood pressure spikes.). ), Disp: 60 tablet, Rfl: 2 .  dronabinol (MARINOL) 5 MG capsule, TAKE 1 CAPSULE BY MOUTH TWICE DAILY WITH MEALS, Disp: 60 capsule, Rfl: 1 .  everolimus (AFINITOR) 10 MG tablet, Take 1 tablet (10 mg total) by mouth daily., Disp: 30 tablet, Rfl: 2 .  gabapentin (NEURONTIN) 300 MG capsule, TAKE 2 CAPSULES BY MOUTH 3 TIMES DAILY., Disp: 180 capsule, Rfl: 6 .  HYDROcodone-acetaminophen (NORCO/VICODIN) 5-325 MG tablet, Take 1-2 tablets by mouth every 6 (six) hours as needed for moderate pain., Disp: 90 tablet, Rfl: 0 .  HYDROmorphone (DILAUDID) 4 MG tablet, Take 4 mg by mouth every 6 (six) hours as needed for severe pain., Disp: , Rfl:  .  insulin glargine (LANTUS) 100 UNIT/ML injection, Inject 31 Units into the skin daily before breakfast. , Disp: , Rfl:  .  insulin lispro (HUMALOG) 100 UNIT/ML injection, Inject 3-9 Units into the skin 3 (three) times daily before meals. SS, Disp: , Rfl:  .  midodrine (PROAMATINE) 5 MG tablet, TKAE 1/2 TABLET BY MOUTH TWICE A DAY AS NEEDED (Patient taking differently: TKAE 1/2 TABLET BY MOUTH TWICE A DAY AS NEEDED BLOOD PRESSURE.), Disp: 60 tablet, Rfl: 6 .  oxybutynin (DITROPAN) 5 MG tablet, , Disp: , Rfl: 3 .  promethazine (PHENERGAN) 12.5 MG tablet, Take 12.5 mg by mouth every 6 (six) hours as needed for nausea. , Disp: , Rfl:  .  traMADol (ULTRAM) 50 MG tablet, TAKE 1 TABLET BY MOUTH 3 TIMES DAILY, Disp: 90 tablet, Rfl: 2  Allergies:  Allergies  Allergen Reactions  . Ciprocin-Fluocin-Procin [Fluocinolone Acetonide] Rash    Pt states this happened with IV Cipro. ABLE TO TAKE PO CIPRO.  . Other Rash    IV-CIPRO    Past Medical History, Surgical history, Social history, and Family History were reviewed and updated.  Review of Systems: As above  Physical Exam:  height is 6' (1.829 m) and weight is 165 lb (74.8 kg). His oral temperature is 97.7 F (36.5 C). His blood pressure is 147/87 (abnormal) and his  pulse is 118 (abnormal). His respiration is 20.   Well-developed and well-nourished gentleman. He is paralyzed from the waist down. His head exam shows no ocular or oral lesions. He has no palpable cervical or supraclavicular lymph nodes. His lungs are clear. Cardiac exam regular rate and rhythm with no murmurs, rubs or bruits. Abdomen is soft. Has good bowel sounds. There is no fluid wave. He has a well-healed laparotomy scar is. He has no obvious abdominal mass. There is no obvious liver or spleen tip. Back exam shows a laminectomy scar in the neck and upper thoracic spine. Extremities shows no swelling in his lower legs. He has no strength secondary to the paralysis. Neurological exam is nonfocal. Skin exam shows no rashes.  Lab Results  Component Value Date   WBC 9.4 09/02/2016   HGB 13.9 09/02/2016   HCT 43.2 09/02/2016   MCV 83 09/02/2016   PLT 279 09/02/2016     Chemistry      Component Value Date/Time   NA 130 09/02/2016 0952   NA 134 (L) 04/05/2016 1611   K 3.3 09/02/2016 0952   K 4.0 04/05/2016 1611   CL 96 (L) 09/02/2016 0952   CO2 29 09/02/2016 0952   CO2 26 04/05/2016 1611   BUN 16 09/02/2016 0952   BUN 18.7 04/05/2016 1611   CREATININE 1.2 09/02/2016 0952   CREATININE 0.9 04/05/2016 1611      Component Value Date/Time   CALCIUM 8.7 09/02/2016 0952   CALCIUM 8.9 04/05/2016 1611   ALKPHOS 119 (H) 09/02/2016 0952   ALKPHOS 124 04/05/2016 1611   AST 35 09/02/2016 0952   AST 31 04/05/2016 1611   ALT 26 09/02/2016 0952   ALT 26 04/05/2016 1611   BILITOT 0.80 09/02/2016 0952   BILITOT 0.76 04/05/2016 1611         Impression and Plan: [name] is 55 year old gentleman with von Hippel-Lindau syndrome. He has multiple neuroendocrine tumors. 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 He's had multiple spinal surgeries.   We will go ahead and get him set up with another CT scan to see how things look. I'll do this in about a month or  so.  I'm just glad his quality of life is doing well.    Volanda Napoleon, MD 9/29/20177:38 AM

## 2016-09-07 MED FILL — FLUCONAZOLE 100 MG TABLET: 100 | 30 days supply | Qty: 30 | Fill #0

## 2016-09-07 MED FILL — CREON DR 24,000 UNITS CAP: 24000-76000 | 30 days supply | Qty: 270 | Fill #3

## 2016-09-13 MED FILL — LANTUS 100 UNITS/ML VIAL: 100 | 28 days supply | Qty: 10 | Fill #3

## 2016-09-15 MED FILL — BACLOFEN 20 MG TABLET: 20 | 30 days supply | Qty: 120 | Fill #0

## 2016-09-20 ENCOUNTER — Other Ambulatory Visit: Payer: Self-pay | Admitting: Hematology & Oncology

## 2016-09-20 MED FILL — BD INSULIN SYR 0.5 ML 30GX1: 30G X 1/2" | 90 days supply | Qty: 100 | Fill #2

## 2016-09-20 MED FILL — DRONABINOL 5 MG CAPSULE: 5 | 30 days supply | Qty: 60 | Fill #0

## 2016-09-21 MED FILL — GABAPENTIN 300 MG CAPSULE: 300 | 30 days supply | Qty: 180 | Fill #1

## 2016-09-27 MED FILL — traMADol HCL 50 MG TABS: 50 | 30 days supply | Qty: 90 | Fill #1

## 2016-10-07 ENCOUNTER — Other Ambulatory Visit: Payer: Self-pay | Admitting: Hematology & Oncology

## 2016-10-07 MED FILL — CREON DR 24,000 UNITS CAP: 24000-76000 | 30 days supply | Qty: 270 | Fill #0

## 2016-10-07 MED FILL — ACCU-CHEK SMARTVIEW TEST ST: 25 days supply | Qty: 100 | Fill #4

## 2016-10-07 MED FILL — HUMALOG 100 UNITS/ML KWIKPE: 100 | 60 days supply | Qty: 15 | Fill #1

## 2016-10-12 ENCOUNTER — Ambulatory Visit (HOSPITAL_BASED_OUTPATIENT_CLINIC_OR_DEPARTMENT_OTHER)
Admission: RE | Admit: 2016-10-12 | Discharge: 2016-10-12 | Disposition: A | Discharge status: Home / Self Care | Payer: 59 | Source: Ambulatory Visit | Attending: Hematology & Oncology | Admitting: Hematology & Oncology

## 2016-10-12 ENCOUNTER — Other Ambulatory Visit (HOSPITAL_BASED_OUTPATIENT_CLINIC_OR_DEPARTMENT_OTHER): Payer: 59

## 2016-10-12 ENCOUNTER — Encounter (HOSPITAL_BASED_OUTPATIENT_CLINIC_OR_DEPARTMENT_OTHER): Payer: Self-pay

## 2016-10-12 ENCOUNTER — Ambulatory Visit (HOSPITAL_BASED_OUTPATIENT_CLINIC_OR_DEPARTMENT_OTHER): Payer: 59 | Admitting: Hematology & Oncology

## 2016-10-12 ENCOUNTER — Ambulatory Visit (HOSPITAL_BASED_OUTPATIENT_CLINIC_OR_DEPARTMENT_OTHER): Payer: 59

## 2016-10-12 VITALS — BP 105/75 | HR 95 | Resp 18

## 2016-10-12 DIAGNOSIS — C7A8 Other malignant neuroendocrine tumors: Secondary | ICD-10-CM

## 2016-10-12 DIAGNOSIS — D49512 Neoplasm of unspecified behavior of left kidney: Secondary | ICD-10-CM | POA: Insufficient documentation

## 2016-10-12 DIAGNOSIS — N3289 Other specified disorders of bladder: Secondary | ICD-10-CM | POA: Diagnosis not present

## 2016-10-12 DIAGNOSIS — Z8719 Personal history of other diseases of the digestive system: Secondary | ICD-10-CM

## 2016-10-12 DIAGNOSIS — C7B8 Other secondary neuroendocrine tumors: Secondary | ICD-10-CM | POA: Diagnosis not present

## 2016-10-12 DIAGNOSIS — D509 Iron deficiency anemia, unspecified: Secondary | ICD-10-CM

## 2016-10-12 DIAGNOSIS — N1330 Unspecified hydronephrosis: Secondary | ICD-10-CM | POA: Diagnosis not present

## 2016-10-12 DIAGNOSIS — R918 Other nonspecific abnormal finding of lung field: Secondary | ICD-10-CM | POA: Diagnosis not present

## 2016-10-12 DIAGNOSIS — G959 Disease of spinal cord, unspecified: Secondary | ICD-10-CM | POA: Insufficient documentation

## 2016-10-12 DIAGNOSIS — N319 Neuromuscular dysfunction of bladder, unspecified: Secondary | ICD-10-CM

## 2016-10-12 DIAGNOSIS — C642 Malignant neoplasm of left kidney, except renal pelvis: Secondary | ICD-10-CM | POA: Diagnosis not present

## 2016-10-12 LAB — COMPREHENSIVE METABOLIC PANEL
ALBUMIN: 3.3 g/dL — AB (ref 3.5–5.0)
ALK PHOS: 159 U/L — AB (ref 40–150)
ALT: 29 U/L (ref 0–55)
AST: 45 U/L — AB (ref 5–34)
Anion Gap: 11 mEq/L (ref 3–11)
BILIRUBIN TOTAL: 0.62 mg/dL (ref 0.20–1.20)
BUN: 22 mg/dL (ref 7.0–26.0)
CO2: 25 mEq/L (ref 22–29)
Calcium: 9 mg/dL (ref 8.4–10.4)
Chloride: 101 mEq/L (ref 98–109)
Creatinine: 0.8 mg/dL (ref 0.7–1.3)
EGFR: >90 mL/min/{1.73_m2} (ref >90)
GLUCOSE: 76 mg/dL (ref 70–140)
POTASSIUM: 3.3 meq/L — AB (ref 3.5–5.1)
SODIUM: 138 meq/L (ref 136–145)
TOTAL PROTEIN: 7.5 g/dL (ref 6.4–8.3)

## 2016-10-12 LAB — CBC WITH DIFFERENTIAL (CANCER CENTER ONLY)
BASO#: 0 10*3/uL (ref 0.0–0.2)
BASO%: 0.2 % (ref 0.0–2.0)
EOS%: 1.3 % (ref 0.0–7.0)
Eosinophils Absolute: 0.1 10*3/uL (ref 0.0–0.5)
HEMATOCRIT: 43.1 % (ref 38.7–49.9)
HEMOGLOBIN: 14.1 g/dL (ref 13.0–17.1)
LYMPH#: 1.1 10*3/uL (ref 0.9–3.3)
LYMPH%: 13.7 % — ABNORMAL LOW (ref 14.0–48.0)
MCH: 26.8 pg — ABNORMAL LOW (ref 28.0–33.4)
MCHC: 32.7 g/dL (ref 32.0–35.9)
MCV: 82 fL (ref 82–98)
MONO#: 0.7 10*3/uL (ref 0.1–0.9)
MONO%: 8.7 % (ref 0.0–13.0)
NEUT%: 76.1 % (ref 40.0–80.0)
NEUTROS ABS: 6.3 10*3/uL (ref 1.5–6.5)
Platelets: 235 10*3/uL (ref 145–400)
RBC: 5.26 10*6/uL (ref 4.20–5.70)
RDW: 14 % (ref 11.1–15.7)
WBC: 8.2 10*3/uL (ref 4.0–10.0)

## 2016-10-12 LAB — LACTATE DEHYDROGENASE: LDH: 196 U/L (ref 125–245)

## 2016-10-12 MED ORDER — LANREOTIDE ACETATE 120 MG/0.5ML ~~LOC~~ SOLN
SUBCUTANEOUS | Status: AC
Start: 2016-10-12 — End: 2016-10-12
  Filled 2016-10-12: qty 120

## 2016-10-12 MED ORDER — LANREOTIDE ACETATE 120 MG/0.5ML ~~LOC~~ SOLN
120.0000 mg | Freq: Once | SUBCUTANEOUS | Status: AC
  Administered 2016-10-12: 120 mg via SUBCUTANEOUS

## 2016-10-12 MED ORDER — IOPAMIDOL (ISOVUE-300) INJECTION 61%
100.0000 mL | Freq: Once | INTRAVENOUS | Status: AC | PRN
  Administered 2016-10-12: 100 mL via INTRAVENOUS

## 2016-10-12 NOTE — Patient Instructions (Signed)
Lanreotide injection What is this medicine? LANREOTIDE (lan REE oh tide) is used to reduce blood levels of growth hormone in patients with a condition called acromegaly. It also works to slow or stop tumor growth in patients with gastroenteropancreatic neuroendocrine tumor (GEP-NET). This medicine may be used for other purposes; ask your health care provider or pharmacist if you have questions. What should I tell my health care provider before I take this medicine? They need to know if you have any of these conditions: -diabetes -gallbladder disease -heart disease -kidney disease -liver disease -an unusual or allergic reaction to lanreotide, other medicines, latex, foods, dyes, or preservatives -pregnant or trying to get pregnant -breast-feeding How should I use this medicine? This medicine is for injection under the skin. It is given by a health care professional in a hospital or clinic setting. Contact your pediatrician or health care professional regarding the use of this medicine in children. Special care may be needed. Overdosage: If you think you have taken too much of this medicine contact a poison control center or emergency room at once. NOTE: This medicine is only for you. Do not share this medicine with others. What if I miss a dose? It is important not to miss your dose. Call your doctor or health care professional if you are unable to keep an appointment. What may interact with this medicine? -bromocriptine -cyclosporine -medicines for diabetes, including insulin -medicines for heart disease or hypertension -quinidine This list may not describe all possible interactions. Give your health care provider a list of all the medicines, herbs, non-prescription drugs, or dietary supplements you use. Also tell them if you smoke, drink alcohol, or use illegal drugs. Some items may interact with your medicine. What should I watch for while using this medicine? Visit your doctor or  health care professional for regular checks on your progress. Your condition will be monitored carefully while you are receiving this medicine. This medicine may cause increases or decreases in blood sugar. Signs of high blood sugar include frequent urination, unusual thirst, flushed or dry skin, difficulty breathing, drowsiness, stomach ache, nausea, vomiting or dry mouth. Signs of low blood sugar include chills, cool, pale skin or cold sweats, drowsiness, extreme hunger, fast heartbeat, headache, nausea, nervousness or anxiety, shakiness, trembling, unsteadiness, tiredness, or weakness. Contact your doctor or health care professional right away if you experience any of these symptoms. What side effects may I notice from receiving this medicine? Side effects that you should report to your doctor or health care professional as soon as possible: -allergic reactions like skin rash, itching or hives, swelling of the face, lips, or tongue -changes in blood sugar -changes in heart rate -severe stomach pain Side effects that usually do not require medical attention (report to your doctor or health care professional if they continue or are bothersome): -diarrhea or constipation -gas or stomach pain -nausea, vomiting -pain, redness, swelling and irritation at site where injected This list may not describe all possible side effects. Call your doctor for medical advice about side effects. You may report side effects to FDA at 1-800-FDA-1088. Where should I keep my medicine? This drug is given in a hospital or clinic and will not be stored at home. NOTE: This sheet is a summary. It may not cover all possible information. If you have questions about this medicine, talk to your doctor, pharmacist, or health care provider.    2016, Elsevier/Gold Standard. (2013-11-21 17:43:04)

## 2016-10-13 NOTE — Progress Notes (Signed)
Hematology and Oncology Follow Up Visit  [name] [id number] 03/18/1961 55 y.o. 10/13/2016   Principle Diagnosis:   Metastatic neuroendocrine carcinoma  Von Hipple-Lindau Syndrome  Current Therapy:  Temodar/Xeloda - start 03/17/2016 - s/p c#3 - discontinued Somatuline 120mg  SQ q month S/P Y-90 intrahepatic therapy - 07/28/2016     Interim History:  [name] is back for followup. He had another intrahepatic therapy with Y-90. This was back about 2 months or so ago. He tolerated this pretty well.  He seems to be doing pretty well right now. He's had no abdominal pain. He's had no spasms. He's had no urinary tract infection.  He did have a CT scan done today. Thankfully, the CT scan showed that there was some improvement in his hepatic disease. A lesion in the right hepatic lobe now measures 21 mm 1 it had measured 33 mm. A second lesion measures 28 mm and previously measured 41 mm. Another lesion in the right hepatic lobe measures 12 mm and previously measured 23 mm. There is no change in his extrahepatic disease.  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 This seems to be getting a little better.   He has not had any urinary tract infections. He has a chronic indwelling Foley.  He does have some occasional abdominal spasms.  He's had no fever. He's had no bleeding. He's had no cough.  Finally, the house remodeling that he and his wife undertook is being completed. This probably is taking close to a year.  His wife brought one of their new puppies to the office. It is a black and tan coonhound.Marland Kitchen He is very cute.   Overall, his performance status is ECOG 1.   Medications:  Current Outpatient Prescriptions:  .  ACCU-CHEK SMARTVIEW test strip, , Disp: , Rfl: 10 .  baclofen (LIORESAL) 20 MG tablet, Take 1-2 tablets (20-40 mg total) by mouth 4 (four) times daily., Disp: 120 each, Rfl: 3 .  Balsam Peru-Castor Oil  (VENELEX) OINT, Apply topically as needed, Disp: 60 g, Rfl: 4 .  CREON 24000-76000 units CPEP, TAKE 3 CAPSULES BY MOUTH 3 TIMES DAILY WITH MEALS., Disp: 270 capsule, Rfl: 3 .  diazepam (VALIUM) 5 MG tablet, TAKE 1 TABLET BY MOUTH 3 TIMES DAILY FOR SPASMS/ANXIETY, Disp: 90 tablet, Rfl: 1 .  doxazosin (CARDURA) 1 MG tablet, Take 1 tablet (1 mg total) by mouth 2 (two) times daily. (Patient taking differently: Take 1 mg by mouth 2 (two) times daily as needed (blood pressure spikes.). ), Disp: 60 tablet, Rfl: 2 .  dronabinol (MARINOL) 5 MG capsule, TAKE 1 CAPSULE BY MOUTH TWICE DAILY WITH MEALS, Disp: 60 capsule, Rfl: 3 .  everolimus (AFINITOR) 10 MG tablet, Take 1 tablet (10 mg total) by mouth daily., Disp: 30 tablet, Rfl: 2 .  fluconazole (DIFLUCAN) 100 MG tablet, Take 1 tablet (100 mg total) by mouth daily., Disp: 30 tablet, Rfl: 4 .  gabapentin (NEURONTIN) 300 MG capsule, TAKE 2 CAPSULES BY MOUTH 3 TIMES DAILY., Disp: 180 capsule, Rfl: 6 .  HYDROcodone-acetaminophen (NORCO/VICODIN) 5-325 MG tablet, Take 1-2 tablets by mouth every 6 (six) hours as needed for moderate pain., Disp: 90 tablet, Rfl: 0 .  HYDROmorphone (DILAUDID) 4 MG tablet, Take 4 mg by mouth every 6 (six) hours as needed for severe pain., Disp: , Rfl:  .  insulin glargine (LANTUS) 100 UNIT/ML injection, Inject 31 Units into the skin daily before  breakfast. , Disp: , Rfl:  .  insulin lispro (HUMALOG) 100 UNIT/ML injection, Inject 3-9 Units into the skin 3 (three) times daily before meals. SS, Disp: , Rfl:  .  midodrine (PROAMATINE) 5 MG tablet, TKAE 1/2 TABLET BY MOUTH TWICE A DAY AS NEEDED (Patient taking differently: TKAE 1/2 TABLET BY MOUTH TWICE A DAY AS NEEDED BLOOD PRESSURE.), Disp: 60 tablet, Rfl: 6 .  oxybutynin (DITROPAN) 5 MG tablet, , Disp: , Rfl: 3 .  promethazine (PHENERGAN) 12.5 MG tablet, Take 12.5 mg by mouth every 6 (six) hours as needed for nausea. , Disp: , Rfl:  .  traMADol (ULTRAM) 50 MG tablet, TAKE 1 TABLET BY MOUTH  3 TIMES DAILY, Disp: 90 tablet, Rfl: 2  Allergies:  Allergies  Allergen Reactions  . Ciprocin-Fluocin-Procin [Fluocinolone Acetonide] Rash    Pt states this happened with IV Cipro. ABLE TO TAKE PO CIPRO.  . Other Rash    IV-CIPRO    Past Medical History, Surgical history, Social history, and Family History were reviewed and updated.  Review of Systems: As above  Physical Exam:  blood pressure is 105/75 and his pulse is 95. His respiration is 18.   Well-developed and well-nourished gentleman. He is paralyzed from the waist down. His head exam shows no ocular or oral lesions. He has no palpable cervical or supraclavicular lymph nodes. His lungs are clear. Cardiac exam regular rate and rhythm with no murmurs, rubs or bruits. Abdomen is soft. Has good bowel sounds. There is no fluid wave. He has a well-healed laparotomy scar is. He has no obvious abdominal mass. There is no obvious liver or spleen tip. Back exam shows a laminectomy scar in the neck and upper thoracic spine. Extremities shows no swelling in his lower legs. He has no strength secondary to the paralysis. Neurological exam is nonfocal. Skin exam shows no rashes.  Lab Results  Component Value Date   WBC 8.2 10/12/2016   HGB 14.1 10/12/2016   HCT 43.1 10/12/2016   MCV 82 10/12/2016   PLT 235 10/12/2016     Chemistry      Component Value Date/Time   NA 138 10/12/2016 0955   K 3.3 (L) 10/12/2016 0955   CL 96 (L) 09/02/2016 0952   CO2 25 10/12/2016 0955   BUN 22.0 10/12/2016 0955   CREATININE 0.8 10/12/2016 0955      Component Value Date/Time   CALCIUM 9.0 10/12/2016 0955   ALKPHOS 159 (H) 10/12/2016 0955   AST 45 (H) 10/12/2016 0955   ALT 29 10/12/2016 0955   BILITOT 0.62 10/12/2016 0955         Impression and Plan: [name] is 55 year old gentleman with von Hippel-Lindau syndrome. He has multiple neuroendocrine tumors. 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 He's had multiple  spinal surgeries.   I'm glad that the CT scan showed that his disease in the liver was a little better. The 0000000 treatment certainly has helped. Maybe, he will be able to get another treatment if he has progression in the future. For some reason, I think that he will have progression.  He'll get his Somatuline injection.  We will plan to get him back in one more month.  I think he maybe gotten a little hypoglycemic in the office. His blood sugar was only 76. For him, this is quite low. We gave him some crackers and some applesauce. This helped him.   Volanda Napoleon, MD 11/8/20177:33 AM

## 2016-10-14 LAB — CHROMOGRANIN A: CHROMOGRAN A: 3 nmol/L (ref 0–5)

## 2016-10-15 MED FILL — LANTUS 100 UNITS/ML VIAL: 100 | 28 days supply | Qty: 10 | Fill #4

## 2016-10-16 ENCOUNTER — Encounter: Payer: Self-pay | Admitting: Hematology & Oncology

## 2016-10-19 MED FILL — DRONABINOL 5 MG CAPSULE: 5 | 30 days supply | Qty: 60 | Fill #1

## 2016-10-20 MED FILL — GABAPENTIN 300 MG CAPSULE: 300 | 30 days supply | Qty: 180 | Fill #2

## 2016-10-27 ENCOUNTER — Telehealth: Payer: Self-pay | Admitting: Radiology

## 2016-10-27 NOTE — Telephone Encounter (Signed)
Per Dr Vernard Gambles phoned patient to relate the following message:  All treated lesions look better/smaller on CT; no unexpected findings.  We can see him back if/when his symptoms recur.    Maisee Vollman Riki Rusk, RN 10/27/2016 3:26 PM

## 2016-11-01 MED FILL — traMADol HCL 50 MG TABS: 50 | 30 days supply | Qty: 90 | Fill #2

## 2016-11-04 MED FILL — BACLOFEN 20 MG TABLET: 20 | 30 days supply | Qty: 120 | Fill #1

## 2016-11-08 MED FILL — CREON DR 24,000 UNITS CAP: 24000-76000 | 30 days supply | Qty: 270 | Fill #1

## 2016-11-08 MED FILL — ACCU-CHEK SMARTVIEW TEST ST: 25 days supply | Qty: 100 | Fill #5

## 2016-11-09 ENCOUNTER — Ambulatory Visit (HOSPITAL_BASED_OUTPATIENT_CLINIC_OR_DEPARTMENT_OTHER): Payer: 59

## 2016-11-09 ENCOUNTER — Encounter: Payer: Self-pay | Admitting: Hematology & Oncology

## 2016-11-09 ENCOUNTER — Other Ambulatory Visit (HOSPITAL_BASED_OUTPATIENT_CLINIC_OR_DEPARTMENT_OTHER): Payer: 59

## 2016-11-09 ENCOUNTER — Ambulatory Visit (HOSPITAL_BASED_OUTPATIENT_CLINIC_OR_DEPARTMENT_OTHER): Payer: 59 | Admitting: Hematology & Oncology

## 2016-11-09 VITALS — BP 125/68 | HR 76 | Temp 98.0°F

## 2016-11-09 DIAGNOSIS — D509 Iron deficiency anemia, unspecified: Secondary | ICD-10-CM | POA: Diagnosis not present

## 2016-11-09 DIAGNOSIS — C7B8 Other secondary neuroendocrine tumors: Secondary | ICD-10-CM

## 2016-11-09 DIAGNOSIS — F324 Major depressive disorder, single episode, in partial remission: Secondary | ICD-10-CM

## 2016-11-09 DIAGNOSIS — C7A8 Other malignant neuroendocrine tumors: Secondary | ICD-10-CM

## 2016-11-09 DIAGNOSIS — N319 Neuromuscular dysfunction of bladder, unspecified: Secondary | ICD-10-CM

## 2016-11-09 LAB — CMP (CANCER CENTER ONLY)
ALT(SGPT): 21 U/L (ref 10–47)
AST: 34 U/L (ref 11–38)
Albumin: 3 g/dL — ABNORMAL LOW (ref 3.3–5.5)
Alkaline Phosphatase: 110 U/L — ABNORMAL HIGH (ref 26–84)
BILIRUBIN TOTAL: 0.8 mg/dL (ref 0.20–1.60)
BUN, Bld: 25 mg/dL — ABNORMAL HIGH (ref 7–22)
CALCIUM: 8.9 mg/dL (ref 8.0–10.3)
CO2: 26 meq/L (ref 18–33)
CREATININE: 0.9 mg/dL (ref 0.6–1.2)
Chloride: 101 mEq/L (ref 98–108)
GLUCOSE: 345 mg/dL — AB (ref 73–118)
Potassium: 3.8 mEq/L (ref 3.3–4.7)
SODIUM: 138 meq/L (ref 128–145)
Total Protein: 6.9 g/dL (ref 6.4–8.1)

## 2016-11-09 LAB — CBC WITH DIFFERENTIAL (CANCER CENTER ONLY)
BASO#: 0 10*3/uL (ref 0.0–0.2)
BASO%: 0.4 % (ref 0.0–2.0)
EOS ABS: 0 10*3/uL (ref 0.0–0.5)
EOS%: 0.5 % (ref 0.0–7.0)
HEMATOCRIT: 39.6 % (ref 38.7–49.9)
HGB: 12.8 g/dL — ABNORMAL LOW (ref 13.0–17.1)
LYMPH#: 1 10*3/uL (ref 0.9–3.3)
LYMPH%: 12.2 % — ABNORMAL LOW (ref 14.0–48.0)
MCH: 26.9 pg — AB (ref 28.0–33.4)
MCHC: 32.3 g/dL (ref 32.0–35.9)
MCV: 83 fL (ref 82–98)
MONO#: 0.5 10*3/uL (ref 0.1–0.9)
MONO%: 6.1 % (ref 0.0–13.0)
NEUT#: 6.8 10*3/uL — ABNORMAL HIGH (ref 1.5–6.5)
NEUT%: 80.8 % — ABNORMAL HIGH (ref 40.0–80.0)
Platelets: 338 10*3/uL (ref 145–400)
RBC: 4.76 10*6/uL (ref 4.20–5.70)
RDW: 14.7 % (ref 11.1–15.7)
WBC: 8.4 10*3/uL (ref 4.0–10.0)

## 2016-11-09 LAB — IRON AND TIBC
%SAT: 21 % (ref 20–55)
Iron: 62 ug/dL (ref 42–163)
TIBC: 296 ug/dL (ref 202–409)
UIBC: 234 ug/dL (ref 117–376)

## 2016-11-09 LAB — FERRITIN: Ferritin: 71 ng/ml (ref 22–316)

## 2016-11-09 LAB — LACTATE DEHYDROGENASE: LDH: 162 U/L (ref 125–245)

## 2016-11-09 MED ORDER — CITALOPRAM HYDROBROMIDE 20 MG PO TABS: 20.0000 mg | ORAL_TABLET | Freq: Every day | ORAL | 4 refills | Status: DC

## 2016-11-09 MED ORDER — LANREOTIDE ACETATE 120 MG/0.5ML ~~LOC~~ SOLN
SUBCUTANEOUS | Status: AC
  Filled 2016-11-09: qty 120

## 2016-11-09 MED ORDER — LANREOTIDE ACETATE 120 MG/0.5ML ~~LOC~~ SOLN
120.0000 mg | Freq: Once | SUBCUTANEOUS | Status: AC
  Administered 2016-11-09: 120 mg via SUBCUTANEOUS

## 2016-11-09 MED FILL — CITALOPRAM HBR 20 MG TABLET: 20 | 30 days supply | Qty: 30 | Fill #0

## 2016-11-09 NOTE — Progress Notes (Signed)
Hematology and Oncology Follow Up Visit  [name] [id number] May 29, 1961 55 y.o. 11/09/2016   Principle Diagnosis:   Metastatic neuroendocrine carcinoma  Von Hipple-Lindau Syndrome  Current Therapy:  Temodar/Xeloda - start 03/17/2016 - s/p c#3 - discontinued Somatuline 120mg  SQ q month S/P Y-90 intrahepatic therapy - 07/28/2016     Interim History:  [name] is back for followup. He had another intrahepatic therapy with Y-90. This was back about 3 months or so ago. He tolerated this pretty well.  He seems to be doing pretty well right now. He's had no abdominal pain. He's had no spasms. He's had no urinary tract infection.  His last chromogranin A level was 3.  He's had no problems with nausea or vomiting. He's had no issues with fever. He's had no rashes.  The neuropathy in his hands continues to improve.  He had a very nice Thanksgiving. Unfortunately, his wife had to work.   He sees his endocrinologist this week. His blood sugar was quite high at 345 today.   Overall, his performance status is ECOG 1.   Medications:  Current Outpatient Prescriptions:  .  ACCU-CHEK SMARTVIEW test strip, , Disp: , Rfl: 10 .  baclofen (LIORESAL) 20 MG tablet, Take 1-2 tablets (20-40 mg total) by mouth 4 (four) times daily., Disp: 120 each, Rfl: 3 .  Balsam Peru-Castor Oil (VENELEX) OINT, Apply topically as needed, Disp: 60 g, Rfl: 4 .  CREON 24000-76000 units CPEP, TAKE 3 CAPSULES BY MOUTH 3 TIMES DAILY WITH MEALS., Disp: 270 capsule, Rfl: 3 .  diazepam (VALIUM) 5 MG tablet, TAKE 1 TABLET BY MOUTH 3 TIMES DAILY FOR SPASMS/ANXIETY, Disp: 90 tablet, Rfl: 1 .  doxazosin (CARDURA) 1 MG tablet, Take 1 tablet (1 mg total) by mouth 2 (two) times daily. (Patient taking differently: Take 1 mg by mouth 2 (two) times daily as needed (blood pressure spikes.). ), Disp: 60 tablet, Rfl: 2 .  dronabinol (MARINOL) 5 MG capsule, TAKE 1 CAPSULE BY MOUTH TWICE DAILY WITH MEALS, Disp: 60 capsule,  Rfl: 3 .  everolimus (AFINITOR) 10 MG tablet, Take 1 tablet (10 mg total) by mouth daily., Disp: 30 tablet, Rfl: 2 .  fluconazole (DIFLUCAN) 100 MG tablet, Take 1 tablet (100 mg total) by mouth daily., Disp: 30 tablet, Rfl: 4 .  gabapentin (NEURONTIN) 300 MG capsule, TAKE 2 CAPSULES BY MOUTH 3 TIMES DAILY., Disp: 180 capsule, Rfl: 6 .  HYDROcodone-acetaminophen (NORCO/VICODIN) 5-325 MG tablet, Take 1-2 tablets by mouth every 6 (six) hours as needed for moderate pain., Disp: 90 tablet, Rfl: 0 .  HYDROmorphone (DILAUDID) 4 MG tablet, Take 4 mg by mouth every 6 (six) hours as needed for severe pain., Disp: , Rfl:  .  insulin glargine (LANTUS) 100 UNIT/ML injection, Inject 31 Units into the skin daily before breakfast. , Disp: , Rfl:  .  insulin lispro (HUMALOG) 100 UNIT/ML injection, Inject 3-9 Units into the skin 3 (three) times daily before meals. SS, Disp: , Rfl:  .  midodrine (PROAMATINE) 5 MG tablet, TKAE 1/2 TABLET BY MOUTH TWICE A DAY AS NEEDED (Patient taking differently: TKAE 1/2 TABLET BY MOUTH TWICE A DAY AS NEEDED BLOOD PRESSURE.), Disp: 60 tablet, Rfl: 6 .  oxybutynin (DITROPAN) 5 MG tablet, , Disp: , Rfl: 3 .  promethazine (PHENERGAN) 12.5 MG tablet, Take 12.5 mg by mouth every 6 (six) hours as needed for nausea. , Disp: , Rfl:  .  traMADol (ULTRAM) 50 MG tablet, TAKE 1 TABLET BY MOUTH  3 TIMES DAILY, Disp: 90 tablet, Rfl: 2  Allergies:  Allergies  Allergen Reactions  . Ciprocin-Fluocin-Procin [Fluocinolone Acetonide] Rash    Pt states this happened with IV Cipro. ABLE TO TAKE PO CIPRO.  . Other Rash    IV-CIPRO    Past Medical History, Surgical history, Social history, and Family History were reviewed and updated.  Review of Systems: As above  Physical Exam:  oral temperature is 98 F (36.7 C). His blood pressure is 125/68 and his pulse is 76.   Well-developed and well-nourished gentleman. He is paralyzed from the waist down. His head exam shows no ocular or oral lesions. He  has no palpable cervical or supraclavicular lymph nodes. His lungs are clear. Cardiac exam regular rate and rhythm with no murmurs, rubs or bruits. Abdomen is soft. Has good bowel sounds. There is no fluid wave. He has a well-healed laparotomy scar is. He has no obvious abdominal mass. There is no obvious liver or spleen tip. Back exam shows a laminectomy scar in the neck and upper thoracic spine. Extremities shows no swelling in his lower legs. He has no strength secondary to the paralysis. Neurological exam is nonfocal. Skin exam shows no rashes.  Lab Results  Component Value Date   WBC 8.4 11/09/2016   HGB 12.8 (L) 11/09/2016   HCT 39.6 11/09/2016   MCV 83 11/09/2016   PLT 338 11/09/2016     Chemistry      Component Value Date/Time   NA 138 11/09/2016 1020   NA 138 10/12/2016 0955   K 3.8 11/09/2016 1020   K 3.3 (L) 10/12/2016 0955   CL 101 11/09/2016 1020   CO2 26 11/09/2016 1020   CO2 25 10/12/2016 0955   BUN 25 (H) 11/09/2016 1020   BUN 22.0 10/12/2016 0955   CREATININE 0.9 11/09/2016 1020   CREATININE 0.8 10/12/2016 0955      Component Value Date/Time   CALCIUM 8.9 11/09/2016 1020   CALCIUM 9.0 10/12/2016 0955   ALKPHOS 110 (H) 11/09/2016 1020   ALKPHOS 159 (H) 10/12/2016 0955   AST 34 11/09/2016 1020   AST 45 (H) 10/12/2016 0955   ALT 21 11/09/2016 1020   ALT 29 10/12/2016 0955   BILITOT 0.80 11/09/2016 1020   BILITOT 0.62 10/12/2016 0955         Impression and Plan: [name] is 55 year old gentleman with von Hippel-Lindau syndrome. He has multiple neuroendocrine tumors. 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 He's had multiple spinal surgeries.   I'm glad that the CT scan showed that his disease in the liver was a little better. The 0000000 treatment certainly has helped. Maybe, he will be able to get another treatment if he has progression in the future. For some reason, I think that he will have progression.  He'll get his  Somatuline injection.  We will plan to get him back in one more month.   Volanda Napoleon, MD 12/5/201712:06 PM

## 2016-11-09 NOTE — Patient Instructions (Signed)
Lanreotide injection What is this medicine? LANREOTIDE (lan REE oh tide) is used to reduce blood levels of growth hormone in patients with a condition called acromegaly. It also works to slow or stop tumor growth in patients with gastroenteropancreatic neuroendocrine tumor (GEP-NET). COMMON BRAND NAME(S): Somatuline [place] What should I tell my health care provider before I take this medicine? They need to know if you have any of these conditions: -diabetes -gallbladder disease -heart disease -kidney disease -liver disease -an unusual or allergic reaction to lanreotide, other medicines, latex, foods, dyes, or preservatives -pregnant or trying to get pregnant -breast-feeding How should I use this medicine? This medicine is for injection under the skin. It is given by a health care professional in a hospital or clinic setting. Contact your pediatrician or health care professional regarding the use of this medicine in children. Special care may be needed. What if I miss a dose? It is important not to miss your dose. Call your doctor or health care professional if you are unable to keep an appointment. What may interact with this medicine? -bromocriptine -cyclosporine -medicines for diabetes, including insulin -medicines for heart disease or hypertension -quinidine What should I watch for while using this medicine? Visit your doctor or health care professional for regular checks on your progress. Your condition will be monitored carefully while you are receiving this medicine. This medicine may cause increases or decreases in blood sugar. Signs of high blood sugar include frequent urination, unusual thirst, flushed or dry skin, difficulty breathing, drowsiness, stomach ache, nausea, vomiting or dry mouth. Signs of low blood sugar include chills, cool, pale skin or cold sweats, drowsiness, extreme hunger, fast heartbeat, headache, nausea, nervousness or anxiety, shakiness, trembling,  unsteadiness, tiredness, or weakness. Contact your doctor or health care professional right away if you experience any of these symptoms. What side effects may I notice from receiving this medicine? Side effects that you should report to your doctor or health care professional as soon as possible: -allergic reactions like skin rash, itching or hives, swelling of the face, lips, or tongue -changes in blood sugar -changes in heart rate -severe stomach pain Side effects that usually do not require medical attention (report to your doctor or health care professional if they continue or are bothersome): -diarrhea or constipation -gas or stomach pain -nausea, vomiting -pain, redness, swelling and irritation at site where injected Where should I keep my medicine? This drug is given in a hospital or clinic and will not be stored at home.  2017 Elsevier/Gold Standard (2013-11-21 17:43:04)

## 2016-11-09 NOTE — Addendum Note (Signed)
Addended by: Burney Gauze R on: 11/09/2016 12:15 PM   Modules accepted: Orders

## 2016-11-11 DIAGNOSIS — E109 Type 1 diabetes mellitus without complications: Secondary | ICD-10-CM | POA: Diagnosis not present

## 2016-11-11 DIAGNOSIS — Z23 Encounter for immunization: Secondary | ICD-10-CM | POA: Diagnosis not present

## 2016-11-11 LAB — CHROMOGRANIN A: Chromogranin A: 4 nmol/L (ref 0–5)

## 2016-11-16 MED FILL — GABAPENTIN 300 MG CAPSULE: 300 | 30 days supply | Qty: 180 | Fill #3

## 2016-11-16 MED FILL — FLUCONAZOLE 100 MG TABLET: 100 | 30 days supply | Qty: 30 | Fill #1

## 2016-11-16 MED FILL — DRONABINOL 5 MG CAPSULE: 5 | 30 days supply | Qty: 60 | Fill #2

## 2016-11-17 ENCOUNTER — Encounter: Payer: Self-pay | Admitting: Hematology & Oncology

## 2016-11-19 MED FILL — tiZANidine HCL 4 MG TABS: 4 | 4 days supply | Qty: 30 | Fill #1

## 2016-11-22 MED FILL — LANTUS 100 UNITS/ML VIAL: 100 | 28 days supply | Qty: 10 | Fill #5

## 2016-12-01 MED FILL — VENELEX OINTMENT: 30 days supply | Qty: 60 | Fill #2

## 2016-12-03 ENCOUNTER — Other Ambulatory Visit: Payer: Self-pay | Admitting: Hematology & Oncology

## 2016-12-03 MED FILL — BACLOFEN 20 MG TABLET: 20 | 30 days supply | Qty: 120 | Fill #2

## 2016-12-03 MED FILL — traMADol HCL 50 MG TABS: 50 | 30 days supply | Qty: 90 | Fill #0

## 2016-12-09 ENCOUNTER — Ambulatory Visit (HOSPITAL_BASED_OUTPATIENT_CLINIC_OR_DEPARTMENT_OTHER): Payer: 59 | Admitting: Hematology & Oncology

## 2016-12-09 ENCOUNTER — Ambulatory Visit: Payer: 59

## 2016-12-09 ENCOUNTER — Other Ambulatory Visit: Payer: 59

## 2016-12-09 ENCOUNTER — Other Ambulatory Visit (HOSPITAL_BASED_OUTPATIENT_CLINIC_OR_DEPARTMENT_OTHER): Payer: 59

## 2016-12-09 ENCOUNTER — Ambulatory Visit: Payer: 59 | Admitting: Hematology & Oncology

## 2016-12-09 ENCOUNTER — Ambulatory Visit (HOSPITAL_BASED_OUTPATIENT_CLINIC_OR_DEPARTMENT_OTHER): Payer: 59

## 2016-12-09 ENCOUNTER — Other Ambulatory Visit: Payer: Self-pay | Admitting: *Deleted

## 2016-12-09 VITALS — BP 89/73 | HR 115 | Temp 97.8°F

## 2016-12-09 DIAGNOSIS — C7B8 Other secondary neuroendocrine tumors: Secondary | ICD-10-CM

## 2016-12-09 DIAGNOSIS — F324 Major depressive disorder, single episode, in partial remission: Secondary | ICD-10-CM | POA: Diagnosis not present

## 2016-12-09 DIAGNOSIS — C7A8 Other malignant neuroendocrine tumors: Secondary | ICD-10-CM | POA: Diagnosis not present

## 2016-12-09 DIAGNOSIS — N319 Neuromuscular dysfunction of bladder, unspecified: Secondary | ICD-10-CM

## 2016-12-09 LAB — CBC WITH DIFFERENTIAL (CANCER CENTER ONLY)
BASO#: 0 10*3/uL (ref 0.0–0.2)
BASO%: 0.3 % (ref 0.0–2.0)
EOS%: 0.5 % (ref 0.0–7.0)
Eosinophils Absolute: 0.1 10*3/uL (ref 0.0–0.5)
HCT: 39.4 % (ref 38.7–49.9)
HGB: 12.7 g/dL — ABNORMAL LOW (ref 13.0–17.1)
LYMPH#: 1.1 10*3/uL (ref 0.9–3.3)
LYMPH%: 10.6 % — ABNORMAL LOW (ref 14.0–48.0)
MCH: 27 pg — ABNORMAL LOW (ref 28.0–33.4)
MCHC: 32.2 g/dL (ref 32.0–35.9)
MCV: 84 fL (ref 82–98)
MONO#: 0.6 10*3/uL (ref 0.1–0.9)
MONO%: 5.2 % (ref 0.0–13.0)
NEUT#: 8.8 10*3/uL — ABNORMAL HIGH (ref 1.5–6.5)
NEUT%: 83.4 % — ABNORMAL HIGH (ref 40.0–80.0)
PLATELETS: 386 10*3/uL (ref 145–400)
RBC: 4.7 10*6/uL (ref 4.20–5.70)
RDW: 14.5 % (ref 11.1–15.7)
WBC: 10.6 10*3/uL — AB (ref 4.0–10.0)

## 2016-12-09 LAB — CMP (CANCER CENTER ONLY)
ALK PHOS: 127 U/L — AB (ref 26–84)
ALT: 28 U/L (ref 10–47)
AST: 35 U/L (ref 11–38)
Albumin: 3.1 g/dL — ABNORMAL LOW (ref 3.3–5.5)
BILIRUBIN TOTAL: 0.7 mg/dL (ref 0.20–1.60)
BUN: 15 mg/dL (ref 7–22)
CO2: 26 mEq/L (ref 18–33)
CREATININE: 0.8 mg/dL (ref 0.6–1.2)
Calcium: 8.9 mg/dL (ref 8.0–10.3)
Chloride: 100 mEq/L (ref 98–108)
Glucose, Bld: 141 mg/dL — ABNORMAL HIGH (ref 73–118)
POTASSIUM: 3.2 meq/L — AB (ref 3.3–4.7)
Sodium: 133 mEq/L (ref 128–145)
TOTAL PROTEIN: 7.2 g/dL (ref 6.4–8.1)

## 2016-12-09 LAB — LACTATE DEHYDROGENASE: LDH: 175 U/L (ref 125–245)

## 2016-12-09 MED ORDER — LANREOTIDE ACETATE 120 MG/0.5ML ~~LOC~~ SOLN
120.0000 mg | Freq: Once | SUBCUTANEOUS | Status: AC
  Administered 2016-12-09: 120 mg via SUBCUTANEOUS

## 2016-12-09 MED ORDER — LANREOTIDE ACETATE 120 MG/0.5ML ~~LOC~~ SOLN
SUBCUTANEOUS | Status: AC
  Filled 2016-12-09: qty 120

## 2016-12-09 NOTE — Patient Instructions (Signed)
Lanreotide injection What is this medicine? LANREOTIDE (lan REE oh tide) is used to reduce blood levels of growth hormone in patients with a condition called acromegaly. It also works to slow or stop tumor growth in patients with gastroenteropancreatic neuroendocrine tumor (GEP-NET). COMMON BRAND NAME(S): Somatuline [place] What should I tell my health care provider before I take this medicine? They need to know if you have any of these conditions: -diabetes -gallbladder disease -heart disease -kidney disease -liver disease -an unusual or allergic reaction to lanreotide, other medicines, latex, foods, dyes, or preservatives -pregnant or trying to get pregnant -breast-feeding How should I use this medicine? This medicine is for injection under the skin. It is given by a health care professional in a hospital or clinic setting. Contact your pediatrician or health care professional regarding the use of this medicine in children. Special care may be needed. What if I miss a dose? It is important not to miss your dose. Call your doctor or health care professional if you are unable to keep an appointment. What may interact with this medicine? -bromocriptine -cyclosporine -medicines for diabetes, including insulin -medicines for heart disease or hypertension -quinidine What should I watch for while using this medicine? Visit your doctor or health care professional for regular checks on your progress. Your condition will be monitored carefully while you are receiving this medicine. This medicine may cause increases or decreases in blood sugar. Signs of high blood sugar include frequent urination, unusual thirst, flushed or dry skin, difficulty breathing, drowsiness, stomach ache, nausea, vomiting or dry mouth. Signs of low blood sugar include chills, cool, pale skin or cold sweats, drowsiness, extreme hunger, fast heartbeat, headache, nausea, nervousness or anxiety, shakiness, trembling,  unsteadiness, tiredness, or weakness. Contact your doctor or health care professional right away if you experience any of these symptoms. What side effects may I notice from receiving this medicine? Side effects that you should report to your doctor or health care professional as soon as possible: -allergic reactions like skin rash, itching or hives, swelling of the face, lips, or tongue -changes in blood sugar -changes in heart rate -severe stomach pain Side effects that usually do not require medical attention (report to your doctor or health care professional if they continue or are bothersome): -diarrhea or constipation -gas or stomach pain -nausea, vomiting -pain, redness, swelling and irritation at site where injected Where should I keep my medicine? This drug is given in a hospital or clinic and will not be stored at home.  2017 Elsevier/Gold Standard (2013-11-21 17:43:04)

## 2016-12-09 NOTE — Progress Notes (Signed)
Hematology and Oncology Follow Up Visit  Copper Scherff [id number] 12-06-1961 56 y.o. 12/09/2016   Principle Diagnosis:   Metastatic neuroendocrine carcinoma  Von Hipple-Lindau Syndrome  Current Therapy:  Temodar/Xeloda - start 03/17/2016 - s/p c#3 - discontinued Somatuline 120mg  SQ q month S/P Y-90 intrahepatic therapy - 07/28/2016     Interim History:  Mr.  Dorfman is back for followup. He had another intrahepatic therapy with Y-90. This was back about 4 months or so ago. He tolerated this pretty well.  He and his wife had a nice Christmas and New Year's. Their house finally got remodeled. Thankfully, there is now no one in the house but them and their dogs.  It has been about a year since he had his neck surgery at [facility]. The neuropathy in his hands is better. He continues to improve. He goes back up there in April for a follow-up visit.   He's had no abdominal pain. He's had no spasms. He's had no urinary tract infection.  His last chromogranin A level was 4.  He's had no problems with nausea or vomiting. He's had no issues with fever. He's had no rashes.  His blood sugars scenery don't pretty well. Today, his blood sugar was only 141.  Overall, his performance status is ECOG 1.   Medications:  Current Outpatient Prescriptions:  .  ACCU-CHEK SMARTVIEW test strip, , Disp: , Rfl: 10 .  baclofen (LIORESAL) 20 MG tablet, Take 1-2 tablets (20-40 mg total) by mouth 4 (four) times daily., Disp: 120 each, Rfl: 3 .  Balsam Peru-Castor Oil (VENELEX) OINT, Apply topically as needed, Disp: 60 g, Rfl: 4 .  citalopram (CELEXA) 20 MG tablet, Take 1 tablet (20 mg total) by mouth daily., Disp: 30 tablet, Rfl: 4 .  CREON 24000-76000 units CPEP, TAKE 3 CAPSULES BY MOUTH 3 TIMES DAILY WITH MEALS., Disp: 270 capsule, Rfl: 3 .  diazepam (VALIUM) 5 MG tablet, TAKE 1 TABLET BY MOUTH 3 TIMES DAILY FOR SPASMS/ANXIETY, Disp: 90 tablet, Rfl: 1 .  doxazosin (CARDURA) 1 MG tablet, Take 1 tablet (1 mg  total) by mouth 2 (two) times daily. (Patient taking differently: Take 1 mg by mouth 2 (two) times daily as needed (blood pressure spikes.). ), Disp: 60 tablet, Rfl: 2 .  dronabinol (MARINOL) 5 MG capsule, TAKE 1 CAPSULE BY MOUTH TWICE DAILY WITH MEALS, Disp: 60 capsule, Rfl: 3 .  everolimus (AFINITOR) 10 MG tablet, Take 1 tablet (10 mg total) by mouth daily., Disp: 30 tablet, Rfl: 2 .  gabapentin (NEURONTIN) 300 MG capsule, TAKE 2 CAPSULES BY MOUTH 3 TIMES DAILY., Disp: 180 capsule, Rfl: 6 .  HYDROcodone-acetaminophen (NORCO/VICODIN) 5-325 MG tablet, Take 1-2 tablets by mouth every 6 (six) hours as needed for moderate pain., Disp: 90 tablet, Rfl: 0 .  HYDROmorphone (DILAUDID) 4 MG tablet, Take 4 mg by mouth every 6 (six) hours as needed for severe pain., Disp: , Rfl:  .  insulin glargine (LANTUS) 100 UNIT/ML injection, Inject 31 Units into the skin daily before breakfast. , Disp: , Rfl:  .  insulin lispro (HUMALOG) 100 UNIT/ML injection, Inject 3-9 Units into the skin 3 (three) times daily before meals. SS, Disp: , Rfl:  .  midodrine (PROAMATINE) 5 MG tablet, TKAE 1/2 TABLET BY MOUTH TWICE A DAY AS NEEDED (Patient taking differently: TKAE 1/2 TABLET BY MOUTH TWICE A DAY AS NEEDED BLOOD PRESSURE.), Disp: 60 tablet, Rfl: 6 .  oxybutynin (DITROPAN) 5 MG tablet, , Disp: , Rfl: 3 .  promethazine (PHENERGAN) 12.5 MG tablet, Take 12.5 mg by mouth every 6 (six) hours as needed for nausea. , Disp: , Rfl:  .  tiZANidine (ZANAFLEX) 4 MG tablet, Take 4 mg by mouth every morning., Disp: , Rfl: 3 .  traMADol (ULTRAM) 50 MG tablet, TAKE 1 TABLET BY MOUTH 3 TIMES DAILY, Disp: 90 tablet, Rfl: 2  Allergies:  Allergies  Allergen Reactions  . Ciprocin-Fluocin-Procin [Fluocinolone Acetonide] Rash    Pt states this happened with IV Cipro. ABLE TO TAKE PO CIPRO.  . Other Rash    IV-CIPRO    Past Medical History, Surgical history, Social history, and Family History were reviewed and updated.  Review of  Systems: As above  Physical Exam:  oral temperature is 97.8 F (36.6 C). His blood pressure is 89/73 (abnormal) and his pulse is 115 (abnormal).   Well-developed and well-nourished gentleman. He is paralyzed from the waist down. His head exam shows no ocular or oral lesions. He has no palpable cervical or supraclavicular lymph nodes. His lungs are clear. Cardiac exam regular rate and rhythm with no murmurs, rubs or bruits. Abdomen is soft. Has good bowel sounds. There is no fluid wave. He has a well-healed laparotomy scar is. He has no obvious abdominal mass. There is no obvious liver or spleen tip. Back exam shows a laminectomy scar in the neck and upper thoracic spine. Extremities shows no swelling in his lower legs. He has no strength secondary to the paralysis. Neurological exam is nonfocal. Skin exam shows no rashes.  Lab Results  Component Value Date   WBC 10.6 (H) 12/09/2016   HGB 12.7 (L) 12/09/2016   HCT 39.4 12/09/2016   MCV 84 12/09/2016   PLT 386 12/09/2016     Chemistry      Component Value Date/Time   NA 133 12/09/2016 1013   NA 138 10/12/2016 0955   K 3.2 (L) 12/09/2016 1013   K 3.3 (L) 10/12/2016 0955   CL 100 12/09/2016 1013   CO2 26 12/09/2016 1013   CO2 25 10/12/2016 0955   BUN 15 12/09/2016 1013   BUN 22.0 10/12/2016 0955   CREATININE 0.8 12/09/2016 1013   CREATININE 0.8 10/12/2016 0955      Component Value Date/Time   CALCIUM 8.9 12/09/2016 1013   CALCIUM 9.0 10/12/2016 0955   ALKPHOS 127 (H) 12/09/2016 1013   ALKPHOS 159 (H) 10/12/2016 0955   AST 35 12/09/2016 1013   AST 45 (H) 10/12/2016 0955   ALT 28 12/09/2016 1013   ALT 29 10/12/2016 0955   BILITOT 0.70 12/09/2016 1013   BILITOT 0.62 10/12/2016 0955         Impression and Plan: Mr. Kallenbach is 56 year old gentleman with von Hippel-Lindau syndrome. He has multiple neuroendocrine tumors. 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 He's had multiple spinal  surgeries.  Overall, he is doing pretty well, from my point of view, with the neuroendocrine tumors.  He will be due for some CT scans next month. I will get these set up when I see him back.  He will get his Somatuline today.  I am just glad that his quality of life is doing pretty well. His blood counts are stable. He has had some iron deficiency in the past.  I spent about 30 minutes with he and his wife. As always, it is very interesting to talk with them.   Volanda Napoleon, MD 1/4/201811:50 AM

## 2016-12-10 LAB — CHROMOGRANIN A: Chromogranin A: 3 nmol/L (ref 0–5)

## 2016-12-10 MED FILL — CREON DR 24,000 UNITS CAP: 24000-76000 | 30 days supply | Qty: 270 | Fill #2

## 2016-12-10 MED FILL — CITALOPRAM HBR 20 MG TABLET: 20 | 30 days supply | Qty: 30 | Fill #1

## 2016-12-23 MED FILL — DRONABINOL 5 MG CAPSULE: 5 | 30 days supply | Qty: 60 | Fill #3

## 2016-12-27 MED FILL — LANTUS 100 UNITS/ML VIAL: 100 | 28 days supply | Qty: 10 | Fill #0

## 2016-12-27 MED FILL — BD INSULIN SYR 0.5 ML 30GX1: 30G X 1/2" | 90 days supply | Qty: 100 | Fill #3

## 2016-12-28 MED FILL — GABAPENTIN 300 MG CAPSULE: 300 | 30 days supply | Qty: 180 | Fill #4

## 2016-12-31 DIAGNOSIS — E109 Type 1 diabetes mellitus without complications: Secondary | ICD-10-CM | POA: Diagnosis not present

## 2017-01-04 DIAGNOSIS — Q858 Other phakomatoses, not elsewhere classified: Secondary | ICD-10-CM | POA: Diagnosis not present

## 2017-01-04 DIAGNOSIS — D1809 Hemangioma of other sites: Secondary | ICD-10-CM | POA: Diagnosis not present

## 2017-01-10 MED FILL — traMADol HCL 50 MG TABS: 50 | 30 days supply | Qty: 90 | Fill #1

## 2017-01-10 MED FILL — ACCU-CHEK SMARTVIEW TEST ST: 25 days supply | Qty: 100 | Fill #6

## 2017-01-13 MED FILL — CREON DR 24,000 UNITS CAP: 24000-76000 | 30 days supply | Qty: 270 | Fill #3

## 2017-01-24 MED FILL — BACLOFEN 20 MG TABLET: 20 | 30 days supply | Qty: 120 | Fill #3

## 2017-01-24 MED FILL — GABAPENTIN 300 MG CAPSULE: 300 | 30 days supply | Qty: 180 | Fill #5

## 2017-01-24 MED FILL — LANTUS 100 UNITS/ML VIAL: 100 | 28 days supply | Qty: 10 | Fill #1

## 2017-01-26 DIAGNOSIS — H40021 Open angle with borderline findings, high risk, right eye: Secondary | ICD-10-CM | POA: Diagnosis not present

## 2017-01-28 ENCOUNTER — Ambulatory Visit (HOSPITAL_BASED_OUTPATIENT_CLINIC_OR_DEPARTMENT_OTHER): Payer: 59 | Admitting: Hematology & Oncology

## 2017-01-28 ENCOUNTER — Ambulatory Visit (HOSPITAL_BASED_OUTPATIENT_CLINIC_OR_DEPARTMENT_OTHER): Payer: 59

## 2017-01-28 ENCOUNTER — Other Ambulatory Visit: Payer: 59

## 2017-01-28 ENCOUNTER — Ambulatory Visit (HOSPITAL_BASED_OUTPATIENT_CLINIC_OR_DEPARTMENT_OTHER)
Admission: RE | Admit: 2017-01-28 | Discharge: 2017-01-28 | Disposition: A | Discharge status: Home / Self Care | Payer: 59 | Source: Ambulatory Visit | Attending: Hematology & Oncology | Admitting: Hematology & Oncology

## 2017-01-28 ENCOUNTER — Encounter (HOSPITAL_BASED_OUTPATIENT_CLINIC_OR_DEPARTMENT_OTHER): Payer: Self-pay

## 2017-01-28 ENCOUNTER — Other Ambulatory Visit (HOSPITAL_BASED_OUTPATIENT_CLINIC_OR_DEPARTMENT_OTHER): Payer: 59

## 2017-01-28 VITALS — BP 106/64 | HR 87 | Temp 97.9°F | Resp 17

## 2017-01-28 DIAGNOSIS — C7A1 Malignant poorly differentiated neuroendocrine tumors: Secondary | ICD-10-CM | POA: Diagnosis not present

## 2017-01-28 DIAGNOSIS — N319 Neuromuscular dysfunction of bladder, unspecified: Secondary | ICD-10-CM

## 2017-01-28 DIAGNOSIS — C787 Secondary malignant neoplasm of liver and intrahepatic bile duct: Secondary | ICD-10-CM | POA: Insufficient documentation

## 2017-01-28 DIAGNOSIS — C7B8 Other secondary neuroendocrine tumors: Secondary | ICD-10-CM | POA: Diagnosis not present

## 2017-01-28 DIAGNOSIS — C7A8 Other malignant neuroendocrine tumors: Secondary | ICD-10-CM

## 2017-01-28 DIAGNOSIS — G959 Disease of spinal cord, unspecified: Secondary | ICD-10-CM | POA: Insufficient documentation

## 2017-01-28 DIAGNOSIS — D500 Iron deficiency anemia secondary to blood loss (chronic): Secondary | ICD-10-CM

## 2017-01-28 DIAGNOSIS — D4102 Neoplasm of uncertain behavior of left kidney: Secondary | ICD-10-CM | POA: Insufficient documentation

## 2017-01-28 LAB — CMP (CANCER CENTER ONLY)
ALT: 18 U/L (ref 10–47)
AST: 22 U/L (ref 11–38)
Albumin: 2.5 g/dL — ABNORMAL LOW (ref 3.3–5.5)
Alkaline Phosphatase: 97 U/L — ABNORMAL HIGH (ref 26–84)
BUN: 17 mg/dL (ref 7–22)
CHLORIDE: 103 meq/L (ref 98–108)
CO2: 28 mEq/L (ref 18–33)
CREATININE: 0.9 mg/dL (ref 0.6–1.2)
Calcium: 8.1 mg/dL (ref 8.0–10.3)
Glucose, Bld: 206 mg/dL — ABNORMAL HIGH (ref 73–118)
Potassium: 4 mEq/L (ref 3.3–4.7)
SODIUM: 139 meq/L (ref 128–145)
TOTAL PROTEIN: 6 g/dL — AB (ref 6.4–8.1)
Total Bilirubin: 0.8 mg/dl (ref 0.20–1.60)

## 2017-01-28 LAB — CBC WITH DIFFERENTIAL (CANCER CENTER ONLY)
BASO#: 0 10*3/uL (ref 0.0–0.2)
BASO%: 0.3 % (ref 0.0–2.0)
EOS%: 2 % (ref 0.0–7.0)
Eosinophils Absolute: 0.1 10*3/uL (ref 0.0–0.5)
HCT: 31.8 % — ABNORMAL LOW (ref 38.7–49.9)
HGB: 9.8 g/dL — ABNORMAL LOW (ref 13.0–17.1)
LYMPH#: 0.9 10*3/uL (ref 0.9–3.3)
LYMPH%: 12.7 % — AB (ref 14.0–48.0)
MCH: 26.3 pg — ABNORMAL LOW (ref 28.0–33.4)
MCHC: 30.8 g/dL — ABNORMAL LOW (ref 32.0–35.9)
MCV: 85 fL (ref 82–98)
MONO#: 0.7 10*3/uL (ref 0.1–0.9)
MONO%: 9.3 % (ref 0.0–13.0)
NEUT#: 5.4 10*3/uL (ref 1.5–6.5)
NEUT%: 75.7 % (ref 40.0–80.0)
PLATELETS: 320 10*3/uL (ref 145–400)
RBC: 3.73 10*6/uL — AB (ref 4.20–5.70)
RDW: 14.3 % (ref 11.1–15.7)
WBC: 7.1 10*3/uL (ref 4.0–10.0)

## 2017-01-28 MED ORDER — LANREOTIDE ACETATE 120 MG/0.5ML ~~LOC~~ SOLN
120.0000 mg | Freq: Once | SUBCUTANEOUS | Status: AC
  Administered 2017-01-28: 120 mg via SUBCUTANEOUS

## 2017-01-28 MED ORDER — IOPAMIDOL (ISOVUE-300) INJECTION 61%
100.0000 mL | Freq: Once | INTRAVENOUS | Status: AC | PRN
  Administered 2017-01-28: 100 mL via INTRAVENOUS

## 2017-01-28 MED ORDER — LANREOTIDE ACETATE 120 MG/0.5ML ~~LOC~~ SOLN
SUBCUTANEOUS | Status: AC
  Filled 2017-01-28: qty 120

## 2017-01-28 MED ORDER — BACLOFEN 20 MG PO TABS: 20.0000 mg | ORAL_TABLET | Freq: Four times a day (QID) | ORAL | 3 refills | Status: DC

## 2017-01-28 NOTE — Patient Instructions (Signed)
Lanreotide injection What is this medicine? LANREOTIDE (lan REE oh tide) is used to reduce blood levels of growth hormone in patients with a condition called acromegaly. It also works to slow or stop tumor growth in patients with gastroenteropancreatic neuroendocrine tumor (GEP-NET). COMMON BRAND NAME(S): Somatuline [place] What should I tell my health care provider before I take this medicine? They need to know if you have any of these conditions: -diabetes -gallbladder disease -heart disease -kidney disease -liver disease -an unusual or allergic reaction to lanreotide, other medicines, latex, foods, dyes, or preservatives -pregnant or trying to get pregnant -breast-feeding How should I use this medicine? This medicine is for injection under the skin. It is given by a health care professional in a hospital or clinic setting. Contact your pediatrician or health care professional regarding the use of this medicine in children. Special care may be needed. What if I miss a dose? It is important not to miss your dose. Call your doctor or health care professional if you are unable to keep an appointment. What may interact with this medicine? -bromocriptine -cyclosporine -medicines for diabetes, including insulin -medicines for heart disease or hypertension -quinidine What should I watch for while using this medicine? Visit your doctor or health care professional for regular checks on your progress. Your condition will be monitored carefully while you are receiving this medicine. This medicine may cause increases or decreases in blood sugar. Signs of high blood sugar include frequent urination, unusual thirst, flushed or dry skin, difficulty breathing, drowsiness, stomach ache, nausea, vomiting or dry mouth. Signs of low blood sugar include chills, cool, pale skin or cold sweats, drowsiness, extreme hunger, fast heartbeat, headache, nausea, nervousness or anxiety, shakiness, trembling,  unsteadiness, tiredness, or weakness. Contact your doctor or health care professional right away if you experience any of these symptoms. What side effects may I notice from receiving this medicine? Side effects that you should report to your doctor or health care professional as soon as possible: -allergic reactions like skin rash, itching or hives, swelling of the face, lips, or tongue -changes in blood sugar -changes in heart rate -severe stomach pain Side effects that usually do not require medical attention (report to your doctor or health care professional if they continue or are bothersome): -diarrhea or constipation -gas or stomach pain -nausea, vomiting -pain, redness, swelling and irritation at site where injected Where should I keep my medicine? This drug is given in a hospital or clinic and will not be stored at home.  2017 Elsevier/Gold Standard (2013-11-21 17:43:04)

## 2017-01-28 NOTE — Progress Notes (Signed)
Hematology and Oncology Follow Up Visit  [name] [id number] 24-Feb-1961 56 y.o. 01/28/2017   Principle Diagnosis:   Metastatic neuroendocrine carcinoma  Von Hipple-Lindau Syndrome  Current Therapy:  Temodar/Xeloda - start 03/17/2016 - s/p c#3 - discontinued Somatuline 120mg  SQ q month S/P Y-90 intrahepatic therapy - 07/28/2016     Interim History:  [name] is back for followup. He is doing okay. The change however is that he has developed this hematoma in the left thigh. This seemed to happen I think over the weekend. He had no trauma. He is on no anticoagulation. He found that he just had a large hematoma in the left thigh. There is no pain. He's had no obvious bleeding elsewhere.  We did do a repeat CT scan. The CT scan basically showed stable disease with respect to his neuroendocrine carcinoma.  His last chromogranin A was 2. This is holding steady.  He has had no problem fever. He's had no issues with the suprapubic catheter.  He has occasional abdominal spasms. He has had some diarrhea. He is on Creon.  There's been no cough. He's had no mouth sores. He's had no dysphagia or odynophagia.   Overall, his performance status is ECOG 1.   Medications:  Current Outpatient Prescriptions:  .  ACCU-CHEK SMARTVIEW test strip, , Disp: , Rfl: 10 .  baclofen (LIORESAL) 20 MG tablet, Take 1-2 tablets (20-40 mg total) by mouth 4 (four) times daily., Disp: 120 each, Rfl: 3 .  Balsam [place]-Castor Oil (VENELEX) OINT, Apply topically as needed, Disp: 60 g, Rfl: 4 .  CREON 24000-76000 units CPEP, TAKE 3 CAPSULES BY MOUTH 3 TIMES DAILY WITH MEALS., Disp: 270 capsule, Rfl: 3 .  diazepam (VALIUM) 5 MG tablet, TAKE 1 TABLET BY MOUTH 3 TIMES DAILY FOR SPASMS/ANXIETY, Disp: 90 tablet, Rfl: 1 .  doxazosin (CARDURA) 1 MG tablet, Take 1 tablet (1 mg total) by mouth 2 (two) times daily. (Patient taking differently: Take 1 mg by mouth 2 (two) times daily as needed (blood pressure  spikes.). ), Disp: 60 tablet, Rfl: 2 .  dronabinol (MARINOL) 5 MG capsule, TAKE 1 CAPSULE BY MOUTH TWICE DAILY WITH MEALS, Disp: 60 capsule, Rfl: 3 .  gabapentin (NEURONTIN) 300 MG capsule, TAKE 2 CAPSULES BY MOUTH 3 TIMES DAILY., Disp: 180 capsule, Rfl: 6 .  HYDROcodone-acetaminophen (NORCO/VICODIN) 5-325 MG tablet, Take 1-2 tablets by mouth every 6 (six) hours as needed for moderate pain., Disp: 90 tablet, Rfl: 0 .  HYDROmorphone (DILAUDID) 4 MG tablet, Take 4 mg by mouth every 6 (six) hours as needed for severe pain., Disp: , Rfl:  .  insulin glargine (LANTUS) 100 UNIT/ML injection, Inject 31 Units into the skin daily before breakfast. , Disp: , Rfl:  .  insulin lispro (HUMALOG) 100 UNIT/ML injection, Inject 3-9 Units into the skin 3 (three) times daily before meals. SS, Disp: , Rfl:  .  midodrine (PROAMATINE) 5 MG tablet, TKAE 1/2 TABLET BY MOUTH TWICE A DAY AS NEEDED (Patient taking differently: TKAE 1/2 TABLET BY MOUTH TWICE A DAY AS NEEDED BLOOD PRESSURE.), Disp: 60 tablet, Rfl: 6 .  oxybutynin (DITROPAN) 5 MG tablet, , Disp: , Rfl: 3 .  promethazine (PHENERGAN) 12.5 MG tablet, Take 12.5 mg by mouth every 6 (six) hours as needed for nausea. , Disp: , Rfl:  .  tiZANidine (ZANAFLEX) 4 MG tablet, Take 4 mg by mouth every morning., Disp: , Rfl: 3 .  traMADol (ULTRAM) 50 MG tablet, TAKE 1 TABLET BY  MOUTH 3 TIMES DAILY, Disp: 90 tablet, Rfl: 2  Allergies:  Allergies  Allergen Reactions  . Ciprocin-Fluocin-Procin [Fluocinolone Acetonide] Rash    Pt states this happened with IV Cipro. ABLE TO TAKE PO CIPRO.  . Other Rash    IV-CIPRO    Past Medical History, Surgical history, Social history, and Family History were reviewed and updated.  Review of Systems: As above  Physical Exam:  oral temperature is 97.9 F (36.6 C). His blood pressure is 106/64 and his pulse is 87. His respiration is 17 and oxygen saturation is 97%.   Well-developed and well-nourished gentleman. He is paralyzed from  the waist down. His head exam shows no ocular or oral lesions. He has no palpable cervical or supraclavicular lymph nodes. His lungs are clear. Cardiac exam regular rate and rhythm with no murmurs, rubs or bruits. Abdomen is soft. Has good bowel sounds. There is no fluid wave. He has a well-healed laparotomy scar is. He has no obvious abdominal mass. There is no obvious liver or spleen tip. Back exam shows a laminectomy scar in the neck and upper thoracic spine. Extremities shows no swelling in his lower legs. He does have the ecchymoses on the inner aspect of his left thigh. This is starting to resolve. He has no strength secondary to the paralysis. Neurological exam is nonfocal. Skin exam shows no rashes.  Lab Results  Component Value Date   WBC 7.1 01/28/2017   HGB 9.8 (L) 01/28/2017   HCT 31.8 (L) 01/28/2017   MCV 85 01/28/2017   PLT 320 01/28/2017     Chemistry      Component Value Date/Time   NA 139 01/28/2017 0826   NA 138 10/12/2016 0955   K 4.0 01/28/2017 0826   K 3.3 (L) 10/12/2016 0955   CL 103 01/28/2017 0826   CO2 28 01/28/2017 0826   CO2 25 10/12/2016 0955   BUN 17 01/28/2017 0826   BUN 22.0 10/12/2016 0955   CREATININE 0.9 01/28/2017 0826   CREATININE 0.8 10/12/2016 0955      Component Value Date/Time   CALCIUM 8.1 01/28/2017 0826   CALCIUM 9.0 10/12/2016 0955   ALKPHOS 97 (H) 01/28/2017 0826   ALKPHOS 159 (H) 10/12/2016 0955   AST 22 01/28/2017 0826   AST 45 (H) 10/12/2016 0955   ALT 18 01/28/2017 0826   ALT 29 10/12/2016 0955   BILITOT 0.80 01/28/2017 0826   BILITOT 0.62 10/12/2016 0955         Impression and Plan: [name] is 56 year old gentleman with von Hippel-Lindau syndrome. He has multiple neuroendocrine tumors. 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 He's had multiple spinal surgeries.  I am grateful that the CT scan did not show any evidence of progressive disease. Thankfully, the FDA just approved the new  radioisotope therapy- Lutathera. This, I think can be useful if we do find that he has progressive disease.  He has is fairly large hematoma on the inside of his left thigh. Not sure how he would've developed this. This would explain the drop in his hemoglobin. I told him to take some folic acid and over-the-counter oral iron.  He and his wife ahead back up to [facility] in April I think for their annual visit.  Overall, I would have to say that he has done incredibly well area and he's had no issues with infections from his indwelling suprapubic catheter.  I spent about 30 minutes with he and his wife.  As always, it is very interesting to talk with them.   Volanda Napoleon, MD 2/23/201810:50 AM

## 2017-01-31 LAB — CHROMOGRANIN A: CHROMOGRAN A: 5 nmol/L (ref 0–5)

## 2017-02-07 ENCOUNTER — Other Ambulatory Visit: Payer: Self-pay | Admitting: Hematology & Oncology

## 2017-02-07 MED FILL — traMADol HCL 50 MG TABS: 50 | 30 days supply | Qty: 90 | Fill #2

## 2017-02-07 MED FILL — CREON DR 24,000 UNITS CAP: 24000-76000 | 30 days supply | Qty: 270 | Fill #0

## 2017-02-18 MED FILL — HUMALOG 100 UNITS/ML KWIKPE: 100 | 60 days supply | Qty: 15 | Fill #0

## 2017-02-18 MED FILL — ACCU-CHEK SMARTVIEW TEST ST: 25 days supply | Qty: 100 | Fill #7

## 2017-02-24 ENCOUNTER — Other Ambulatory Visit: Payer: Self-pay | Admitting: Hematology & Oncology

## 2017-02-24 MED FILL — DRONABINOL 5 MG CAPSULE: 5 | 30 days supply | Qty: 60 | Fill #0

## 2017-02-25 ENCOUNTER — Ambulatory Visit (HOSPITAL_BASED_OUTPATIENT_CLINIC_OR_DEPARTMENT_OTHER): Payer: 59

## 2017-02-25 ENCOUNTER — Encounter: Payer: Self-pay | Admitting: Hematology & Oncology

## 2017-02-25 ENCOUNTER — Ambulatory Visit (HOSPITAL_BASED_OUTPATIENT_CLINIC_OR_DEPARTMENT_OTHER): Payer: 59 | Admitting: Hematology & Oncology

## 2017-02-25 ENCOUNTER — Other Ambulatory Visit (HOSPITAL_BASED_OUTPATIENT_CLINIC_OR_DEPARTMENT_OTHER): Payer: 59

## 2017-02-25 VITALS — BP 93/74 | HR 88 | Temp 97.4°F | Resp 16

## 2017-02-25 DIAGNOSIS — N319 Neuromuscular dysfunction of bladder, unspecified: Secondary | ICD-10-CM

## 2017-02-25 DIAGNOSIS — D509 Iron deficiency anemia, unspecified: Secondary | ICD-10-CM

## 2017-02-25 DIAGNOSIS — C7B8 Other secondary neuroendocrine tumors: Secondary | ICD-10-CM

## 2017-02-25 DIAGNOSIS — C7A8 Other malignant neuroendocrine tumors: Secondary | ICD-10-CM

## 2017-02-25 DIAGNOSIS — D500 Iron deficiency anemia secondary to blood loss (chronic): Secondary | ICD-10-CM

## 2017-02-25 LAB — CMP (CANCER CENTER ONLY)
ALBUMIN: 2.9 g/dL — AB (ref 3.3–5.5)
ALK PHOS: 128 U/L — AB (ref 26–84)
ALT: 33 U/L (ref 10–47)
AST: 59 U/L — AB (ref 11–38)
BUN, Bld: 15 mg/dL (ref 7–22)
CALCIUM: 9 mg/dL (ref 8.0–10.3)
CO2: 30 mEq/L (ref 18–33)
Chloride: 96 mEq/L — ABNORMAL LOW (ref 98–108)
Creat: 0.8 mg/dl (ref 0.6–1.2)
Glucose, Bld: 68 mg/dL — ABNORMAL LOW (ref 73–118)
POTASSIUM: 3.3 meq/L (ref 3.3–4.7)
Sodium: 136 mEq/L (ref 128–145)
Total Bilirubin: 0.6 mg/dl (ref 0.20–1.60)
Total Protein: 7 g/dL (ref 6.4–8.1)

## 2017-02-25 LAB — CBC WITH DIFFERENTIAL (CANCER CENTER ONLY)
BASO#: 0 10*3/uL (ref 0.0–0.2)
BASO%: 0.6 % (ref 0.0–2.0)
EOS%: 1.2 % (ref 0.0–7.0)
Eosinophils Absolute: 0.1 10*3/uL (ref 0.0–0.5)
HCT: 42.1 % (ref 38.7–49.9)
HEMOGLOBIN: 13.2 g/dL (ref 13.0–17.1)
LYMPH#: 1.5 10*3/uL (ref 0.9–3.3)
LYMPH%: 21.6 % (ref 14.0–48.0)
MCH: 26.3 pg — ABNORMAL LOW (ref 28.0–33.4)
MCHC: 31.4 g/dL — AB (ref 32.0–35.9)
MCV: 84 fL (ref 82–98)
MONO#: 0.6 10*3/uL (ref 0.1–0.9)
MONO%: 9.5 % (ref 0.0–13.0)
NEUT%: 67.1 % (ref 40.0–80.0)
NEUTROS ABS: 4.5 10*3/uL (ref 1.5–6.5)
Platelets: 297 10*3/uL (ref 145–400)
RBC: 5.02 10*6/uL (ref 4.20–5.70)
RDW: 14.5 % (ref 11.1–15.7)
WBC: 6.7 10*3/uL (ref 4.0–10.0)

## 2017-02-25 LAB — FERRITIN: Ferritin: 108 ng/ml (ref 22–316)

## 2017-02-25 LAB — IRON AND TIBC
%SAT: 32 % (ref 20–55)
IRON: 94 ug/dL (ref 42–163)
TIBC: 296 ug/dL (ref 202–409)
UIBC: 202 ug/dL (ref 117–376)

## 2017-02-25 MED ORDER — LANREOTIDE ACETATE 120 MG/0.5ML ~~LOC~~ SOLN
SUBCUTANEOUS | Status: AC
  Filled 2017-02-25: qty 120

## 2017-02-25 MED ORDER — LANREOTIDE ACETATE 120 MG/0.5ML ~~LOC~~ SOLN
120.0000 mg | Freq: Once | SUBCUTANEOUS | Status: AC
  Administered 2017-02-25: 120 mg via SUBCUTANEOUS

## 2017-02-25 MED FILL — GABAPENTIN 300 MG CAPSULE: 300 | 30 days supply | Qty: 180 | Fill #6

## 2017-02-25 NOTE — Progress Notes (Signed)
Hematology and Oncology Follow Up Visit  [name] [id number] 09-15-1961 56 y.o. 02/25/2017   Principle Diagnosis:   Metastatic neuroendocrine carcinoma  Von Hipple-Lindau Syndrome  Current Therapy:  Temodar/Xeloda - start 03/17/2016 - s/p c#3 - discontinued Somatuline 120mg  SQ q month S/P Y-90 intrahepatic therapy - 07/28/2016     Interim History:  Mr.  [name] is back for followup. He is doing okay. He is feeling pretty well. His blood sugars actually have been on the low side. I told him that is much safer for his blood sugars be on the higher side then low side.  He's had no problems with cough. He's had no fever. He's had no rashes.  He has a chronic indwelling Foley catheter. This hatches a suprapubic catheter. Thank you, he has not had any issues with urinary tract infections.  He's had no bleeding. Last time we saw him, he had a hematoma on his left thigh.  He's had no sweats.  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 He is eating well. He has had no nausea or vomiting.   He is supposed to go to the [place] sometime in April. His been always a year since he had his last surgery up there.  His last chromogranin A level was 2.  Overall, his performance status is ECOG 1.   Medications:  Current Outpatient Prescriptions:  .  dronabinol (MARINOL) 5 MG capsule, TAKE 1 CAPSULE BY MOUTH TWICE DAILY WITH MEALS, Disp: 60 capsule, Rfl: 3 .  gabapentin (NEURONTIN) 300 MG capsule, TAKE 2 CAPSULES BY MOUTH 3 TIMES DAILY., Disp: 180 capsule, Rfl: 6 .  HYDROcodone-acetaminophen (NORCO/VICODIN) 5-325 MG tablet, Take 1-2 tablets by mouth every 6 (six) hours as needed for moderate pain., Disp: 90 tablet, Rfl: 0 .  HYDROmorphone (DILAUDID) 4 MG tablet, Take 4 mg by mouth every 6 (six) hours as needed for severe pain., Disp: , Rfl:  .  insulin glargine (LANTUS) 100 UNIT/ML injection, Inject 31 Units into the skin daily before breakfast. , Disp: , Rfl:  .   insulin lispro (HUMALOG) 100 UNIT/ML injection, Inject 3-9 Units into the skin 3 (three) times daily before meals. SS, Disp: , Rfl:  .  midodrine (PROAMATINE) 5 MG tablet, TKAE 1/2 TABLET BY MOUTH TWICE A DAY AS NEEDED (Patient taking differently: TKAE 1/2 TABLET BY MOUTH TWICE A DAY AS NEEDED BLOOD PRESSURE.), Disp: 60 tablet, Rfl: 6 .  oxybutynin (DITROPAN) 5 MG tablet, , Disp: , Rfl: 3 .  promethazine (PHENERGAN) 12.5 MG tablet, Take 12.5 mg by mouth every 6 (six) hours as needed for nausea. , Disp: , Rfl:  .  tiZANidine (ZANAFLEX) 4 MG tablet, Take 4 mg by mouth every morning., Disp: , Rfl: 3 .  traMADol (ULTRAM) 50 MG tablet, TAKE 1 TABLET BY MOUTH 3 TIMES DAILY, Disp: 90 tablet, Rfl: 2 .  ACCU-CHEK SMARTVIEW test strip, , Disp: , Rfl: 10 .  baclofen (LIORESAL) 20 MG tablet, Take 1-2 tablets (20-40 mg total) by mouth 4 (four) times daily., Disp: 120 each, Rfl: 3 .  Balsam Peru-Castor Oil (VENELEX) OINT, Apply topically as needed, Disp: 60 g, Rfl: 4 .  CREON 24000-76000 units CPEP, TAKE 3 CAPSULES BY MOUTH 3 TIMES DAILY WITH MEALS., Disp: 270 capsule, Rfl: 3 .  diazepam (VALIUM) 5 MG tablet, TAKE 1 TABLET BY MOUTH 3 TIMES DAILY FOR SPASMS/ANXIETY, Disp: 90 tablet, Rfl: 1 .  doxazosin (CARDURA) 1 MG tablet, Take 1 tablet (1 mg total)  by mouth 2 (two) times daily. (Patient taking differently: Take 1 mg by mouth 2 (two) times daily as needed (blood pressure spikes.). ), Disp: 60 tablet, Rfl: 2  Allergies:  Allergies  Allergen Reactions  . Citalopram Diarrhea  . Ciprocin-Fluocin-Procin [Fluocinolone Acetonide] Rash    Pt states this happened with IV Cipro. ABLE TO TAKE PO CIPRO.  . Other Rash    IV-CIPRO    Past Medical History, Surgical history, Social history, and Family History were reviewed and updated.  Review of Systems: As above  Physical Exam:  blood pressure is 93/74.   Well-developed and well-nourished gentleman. He is paralyzed from the waist down. His head exam shows no  ocular or oral lesions. He has no palpable cervical or supraclavicular lymph nodes. His lungs are clear. Cardiac exam regular rate and rhythm with no murmurs, rubs or bruits. Abdomen is soft. Has good bowel sounds. There is no fluid wave. He has a well-healed laparotomy scar is. He has no obvious abdominal mass. There is no obvious liver or spleen tip. Back exam shows a laminectomy scar in the neck and upper thoracic spine. Extremities shows no swelling in his lower legs. He does have the ecchymoses on the inner aspect of his left thigh. This is starting to resolve. He has no strength secondary to the paralysis. Neurological exam is nonfocal. Skin exam shows no rashes.  Lab Results  Component Value Date   WBC 6.7 02/25/2017   HGB 13.2 02/25/2017   HCT 42.1 02/25/2017   MCV 84 02/25/2017   PLT 297 02/25/2017     Chemistry      Component Value Date/Time   NA 139 01/28/2017 0826   NA 138 10/12/2016 0955   K 4.0 01/28/2017 0826   K 3.3 (L) 10/12/2016 0955   CL 103 01/28/2017 0826   CO2 28 01/28/2017 0826   CO2 25 10/12/2016 0955   BUN 17 01/28/2017 0826   BUN 22.0 10/12/2016 0955   CREATININE 0.9 01/28/2017 0826   CREATININE 0.8 10/12/2016 0955      Component Value Date/Time   CALCIUM 8.1 01/28/2017 0826   CALCIUM 9.0 10/12/2016 0955   ALKPHOS 97 (H) 01/28/2017 0826   ALKPHOS 159 (H) 10/12/2016 0955   AST 22 01/28/2017 0826   AST 45 (H) 10/12/2016 0955   ALT 18 01/28/2017 0826   ALT 29 10/12/2016 0955   BILITOT 0.80 01/28/2017 0826   BILITOT 0.62 10/12/2016 0955         Impression and Plan: [name] is 56 year old gentleman with von Hippel-Lindau syndrome. He has multiple neuroendocrine tumors. 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 He's had multiple spinal surgeries.  I am grateful that the CT scan did not show any evidence of progressive disease. Thankfully, the FDA just approved the new radioisotope therapy- Lutathera. This, I think can be  useful if we do find that he has progressive disease.  His last scans were done in February. I don't think we have to do anything probably until May.   I'm just glad that his quality of life has been doing so well.   Hopefully, he will be able to go to the [facility] in April.   I will see him back in one month.      Ned Grace, MD 3/23/20189:16 AM

## 2017-02-28 LAB — CHROMOGRANIN A: Chromogranin A: 3 nmol/L (ref 0–5)

## 2017-03-01 MED FILL — OXYBUTYNIN 5 MG TABLET: 5 | 30 days supply | Qty: 60 | Fill #0

## 2017-03-03 MED FILL — LANTUS 100 UNITS/ML VIAL: 100 | 28 days supply | Qty: 10 | Fill #2

## 2017-03-09 ENCOUNTER — Other Ambulatory Visit: Payer: Self-pay | Admitting: Hematology & Oncology

## 2017-03-09 MED FILL — CREON DR 24,000 UNITS CAP: 24000-76000 | 30 days supply | Qty: 270 | Fill #1

## 2017-03-09 MED FILL — traMADol HCL 50 MG TABS: 50 | 30 days supply | Qty: 90 | Fill #0

## 2017-03-17 MED FILL — BACLOFEN 20 MG TABLET: 20 | 30 days supply | Qty: 120 | Fill #0

## 2017-03-23 DIAGNOSIS — Z85528 Personal history of other malignant neoplasm of kidney: Secondary | ICD-10-CM | POA: Diagnosis not present

## 2017-03-23 DIAGNOSIS — N319 Neuromuscular dysfunction of bladder, unspecified: Secondary | ICD-10-CM | POA: Diagnosis not present

## 2017-03-28 ENCOUNTER — Other Ambulatory Visit: Payer: Self-pay | Admitting: Hematology & Oncology

## 2017-03-28 DIAGNOSIS — C7A8 Other malignant neuroendocrine tumors: Secondary | ICD-10-CM

## 2017-03-28 DIAGNOSIS — Z23 Encounter for immunization: Secondary | ICD-10-CM

## 2017-03-28 MED FILL — diazePAM 5 MG TABS: 5 | 30 days supply | Qty: 90 | Fill #0

## 2017-03-28 MED FILL — tiZANidine HCL 4 MG TABS: 4 | 4 days supply | Qty: 30 | Fill #0

## 2017-03-28 MED FILL — GABAPENTIN 300 MG CAPSULE: 300 | 30 days supply | Qty: 180 | Fill #0

## 2017-03-28 MED FILL — DRONABINOL 5 MG CAPSULE: 5 | 30 days supply | Qty: 60 | Fill #1

## 2017-03-29 MED FILL — OXYBUTYNIN 5 MG TABLET: 5 | 90 days supply | Qty: 180 | Fill #0

## 2017-03-31 ENCOUNTER — Ambulatory Visit (HOSPITAL_BASED_OUTPATIENT_CLINIC_OR_DEPARTMENT_OTHER): Payer: 59

## 2017-03-31 ENCOUNTER — Other Ambulatory Visit (HOSPITAL_BASED_OUTPATIENT_CLINIC_OR_DEPARTMENT_OTHER): Payer: 59

## 2017-03-31 ENCOUNTER — Ambulatory Visit (HOSPITAL_BASED_OUTPATIENT_CLINIC_OR_DEPARTMENT_OTHER): Payer: 59 | Admitting: Hematology & Oncology

## 2017-03-31 VITALS — BP 106/61 | HR 88 | Temp 97.6°F | Resp 16

## 2017-03-31 DIAGNOSIS — C7B8 Other secondary neuroendocrine tumors: Secondary | ICD-10-CM | POA: Diagnosis not present

## 2017-03-31 DIAGNOSIS — N319 Neuromuscular dysfunction of bladder, unspecified: Secondary | ICD-10-CM

## 2017-03-31 DIAGNOSIS — C7A8 Other malignant neuroendocrine tumors: Secondary | ICD-10-CM

## 2017-03-31 DIAGNOSIS — J0110 Acute frontal sinusitis, unspecified: Secondary | ICD-10-CM

## 2017-03-31 LAB — CBC WITH DIFFERENTIAL (CANCER CENTER ONLY)
BASO#: 0 10*3/uL (ref 0.0–0.2)
BASO%: 0.1 % (ref 0.0–2.0)
EOS ABS: 0.1 10*3/uL (ref 0.0–0.5)
EOS%: 0.9 % (ref 0.0–7.0)
HEMATOCRIT: 44.7 % (ref 38.7–49.9)
HGB: 14.5 g/dL (ref 13.0–17.1)
LYMPH#: 1.3 10*3/uL (ref 0.9–3.3)
LYMPH%: 9.1 % — AB (ref 14.0–48.0)
MCH: 26.5 pg — AB (ref 28.0–33.4)
MCHC: 32.4 g/dL (ref 32.0–35.9)
MCV: 82 fL (ref 82–98)
MONO#: 0.8 10*3/uL (ref 0.1–0.9)
MONO%: 5.7 % (ref 0.0–13.0)
NEUT#: 11.7 10*3/uL — ABNORMAL HIGH (ref 1.5–6.5)
NEUT%: 84.2 % — AB (ref 40.0–80.0)
Platelets: 256 10*3/uL (ref 145–400)
RBC: 5.47 10*6/uL (ref 4.20–5.70)
RDW: 14.1 % (ref 11.1–15.7)
WBC: 13.9 10*3/uL — ABNORMAL HIGH (ref 4.0–10.0)

## 2017-03-31 LAB — CMP (CANCER CENTER ONLY)
ALK PHOS: 95 U/L — AB (ref 26–84)
ALT(SGPT): 19 U/L (ref 10–47)
AST: 25 U/L (ref 11–38)
Albumin: 2.8 g/dL — ABNORMAL LOW (ref 3.3–5.5)
BILIRUBIN TOTAL: 0.6 mg/dL (ref 0.20–1.60)
BUN: 13 mg/dL (ref 7–22)
CALCIUM: 8.6 mg/dL (ref 8.0–10.3)
CHLORIDE: 92 meq/L — AB (ref 98–108)
CO2: 29 mEq/L (ref 18–33)
Creat: 0.9 mg/dl (ref 0.6–1.2)
Glucose, Bld: 244 mg/dL — ABNORMAL HIGH (ref 73–118)
POTASSIUM: 3.5 meq/L (ref 3.3–4.7)
Sodium: 131 mEq/L (ref 128–145)
TOTAL PROTEIN: 6.5 g/dL (ref 6.4–8.1)

## 2017-03-31 LAB — LACTATE DEHYDROGENASE: LDH: 141 U/L (ref 125–245)

## 2017-03-31 MED ORDER — AMOXICILLIN-POT CLAVULANATE 875-125 MG PO TABS: 1.0000 | ORAL_TABLET | Freq: Two times a day (BID) | ORAL | 0 refills | Status: DC

## 2017-03-31 MED ORDER — LANREOTIDE ACETATE 120 MG/0.5ML ~~LOC~~ SOLN
SUBCUTANEOUS | Status: AC
  Filled 2017-03-31: qty 120

## 2017-03-31 MED ORDER — LANREOTIDE ACETATE 120 MG/0.5ML ~~LOC~~ SOLN
120.0000 mg | Freq: Once | SUBCUTANEOUS | Status: AC
  Administered 2017-03-31: 120 mg via SUBCUTANEOUS

## 2017-03-31 MED FILL — AMOX TR-K CLV 875-125 MG TA: 875-125 | 7 days supply | Qty: 14 | Fill #0

## 2017-03-31 NOTE — Progress Notes (Signed)
Hematology and Oncology Follow Up Visit  [name] [id number] 1961/03/14 56 y.o. 02/25/2017   Principle Diagnosis:   Metastatic neuroendocrine carcinoma  Von Hipple-Lindau Syndrome  Current Therapy:  Temodar/Xeloda - start 03/17/2016 - s/p c#3 - discontinued Somatuline 120mg  SQ q month S/P Y-90 intrahepatic therapy - 07/28/2016     Interim History:  [name] is back for followup. He is doing okay. He is feeling pretty well. His blood sugars actually have been on the low side. I told him that is much safer for his blood sugars be on the higher side then low side.  He's had a little cough. He says this is been productive of purulent mucus.. He's had no fever. He's had no rashes.  He has a chronic indwelling Foley catheter. This was recently changed out by his urologist. He had a cystoscopy at the same time. Nothing looked unusual.   He's had no bleeding. Last time we saw him, he had a hematoma on his left thigh.  He's had no sweats.  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 He is eating well. He has had no nausea or vomiting.   He was supposed to go to the [facility] sometime in April. His been always a year since he had his last surgery up there. He has not yet heard from them.  His last chromogranin A level was 2.  His iron studies have been looking quite good. Again his had no bleeding. His last iron studies back in March showed a ferritin of 108 with iron saturation 32%.  Overall, his performance status is ECOG 1.   Medications:  Current Outpatient Prescriptions:  .  dronabinol (MARINOL) 5 MG capsule, TAKE 1 CAPSULE BY MOUTH TWICE DAILY WITH MEALS, Disp: 60 capsule, Rfl: 3 .  gabapentin (NEURONTIN) 300 MG capsule, TAKE 2 CAPSULES BY MOUTH 3 TIMES DAILY., Disp: 180 capsule, Rfl: 6 .  HYDROcodone-acetaminophen (NORCO/VICODIN) 5-325 MG tablet, Take 1-2 tablets by mouth every 6 (six) hours as needed for moderate pain., Disp: 90 tablet, Rfl: 0 .   HYDROmorphone (DILAUDID) 4 MG tablet, Take 4 mg by mouth every 6 (six) hours as needed for severe pain., Disp: , Rfl:  .  insulin glargine (LANTUS) 100 UNIT/ML injection, Inject 31 Units into the skin daily before breakfast. , Disp: , Rfl:  .  insulin lispro (HUMALOG) 100 UNIT/ML injection, Inject 3-9 Units into the skin 3 (three) times daily before meals. SS, Disp: , Rfl:  .  midodrine (PROAMATINE) 5 MG tablet, TKAE 1/2 TABLET BY MOUTH TWICE A DAY AS NEEDED (Patient taking differently: TKAE 1/2 TABLET BY MOUTH TWICE A DAY AS NEEDED BLOOD PRESSURE.), Disp: 60 tablet, Rfl: 6 .  oxybutynin (DITROPAN) 5 MG tablet, , Disp: , Rfl: 3 .  promethazine (PHENERGAN) 12.5 MG tablet, Take 12.5 mg by mouth every 6 (six) hours as needed for nausea. , Disp: , Rfl:  .  tiZANidine (ZANAFLEX) 4 MG tablet, Take 4 mg by mouth every morning., Disp: , Rfl: 3 .  traMADol (ULTRAM) 50 MG tablet, TAKE 1 TABLET BY MOUTH 3 TIMES DAILY, Disp: 90 tablet, Rfl: 2 .  ACCU-CHEK SMARTVIEW test strip, , Disp: , Rfl: 10 .  baclofen (LIORESAL) 20 MG tablet, Take 1-2 tablets (20-40 mg total) by mouth 4 (four) times daily., Disp: 120 each, Rfl: 3 .  Balsam Peru-Castor Oil (VENELEX) OINT, Apply topically as needed, Disp: 60 g, Rfl: 4 .  CREON 24000-76000 units CPEP, TAKE 3  CAPSULES BY MOUTH 3 TIMES DAILY WITH MEALS., Disp: 270 capsule, Rfl: 3 .  diazepam (VALIUM) 5 MG tablet, TAKE 1 TABLET BY MOUTH 3 TIMES DAILY FOR SPASMS/ANXIETY, Disp: 90 tablet, Rfl: 1 .  doxazosin (CARDURA) 1 MG tablet, Take 1 tablet (1 mg total) by mouth 2 (two) times daily. (Patient taking differently: Take 1 mg by mouth 2 (two) times daily as needed (blood pressure spikes.). ), Disp: 60 tablet, Rfl: 2  Allergies:  Allergies  Allergen Reactions  . Citalopram Diarrhea  . Ciprocin-Fluocin-Procin [Fluocinolone Acetonide] Rash    Pt states this happened with IV Cipro. ABLE TO TAKE PO CIPRO.  . Other Rash    IV-CIPRO    Past Medical History, Surgical history,  Social history, and Family History were reviewed and updated.  Review of Systems: As above  Physical Exam:  blood pressure is 93/74.   Well-developed and well-nourished gentleman. He is paralyzed from the waist down. His head exam shows no ocular or oral lesions. He has no palpable cervical or supraclavicular lymph nodes. His lungs are clear. Cardiac exam regular rate and rhythm with no murmurs, rubs or bruits. Abdomen is soft. Has good bowel sounds. There is no fluid wave. He has a well-healed laparotomy scar is. He has no obvious abdominal mass. There is no obvious liver or spleen tip. Back exam shows a laminectomy scar in the neck and upper thoracic spine. Extremities shows no swelling in his lower legs. He does have the ecchymoses on the inner aspect of his left thigh. This is starting to resolve. He has no strength secondary to the paralysis. Neurological exam is nonfocal. Skin exam shows no rashes.  Lab Results  Component Value Date   WBC 6.7 02/25/2017   HGB 13.2 02/25/2017   HCT 42.1 02/25/2017   MCV 84 02/25/2017   PLT 297 02/25/2017     Chemistry      Component Value Date/Time   NA 139 01/28/2017 0826   NA 138 10/12/2016 0955   K 4.0 01/28/2017 0826   K 3.3 (L) 10/12/2016 0955   CL 103 01/28/2017 0826   CO2 28 01/28/2017 0826   CO2 25 10/12/2016 0955   BUN 17 01/28/2017 0826   BUN 22.0 10/12/2016 0955   CREATININE 0.9 01/28/2017 0826   CREATININE 0.8 10/12/2016 0955      Component Value Date/Time   CALCIUM 8.1 01/28/2017 0826   CALCIUM 9.0 10/12/2016 0955   ALKPHOS 97 (H) 01/28/2017 0826   ALKPHOS 159 (H) 10/12/2016 0955   AST 22 01/28/2017 0826   AST 45 (H) 10/12/2016 0955   ALT 18 01/28/2017 0826   ALT 29 10/12/2016 0955   BILITOT 0.80 01/28/2017 0826   BILITOT 0.62 10/12/2016 0955         Impression and Plan: [name] is 56 year old gentleman with von Hippel-Lindau syndrome. He has multiple neuroendocrine tumors. He is paralyzed because of spinal  cord involvement from a malignancy. He's had multiple spinal surgeries.  We will go ahead and get him set up with another set of scans in May.  I will get him on some antibiotics. With his white cell count being elevated and his neutrophils being elevated along with his blood sugar, I think that he is at a "setup" for infection. This just may not have manifested itself yet. I will try him on some Augmentin. I think this usually works well.   We will get the set of scans when we see him back in one month.  Ned Grace, MD 3/23/20189:16 AM

## 2017-03-31 NOTE — Patient Instructions (Signed)
Lanreotide injection What is this medicine? LANREOTIDE (lan REE oh tide) is used to reduce blood levels of growth hormone in patients with a condition called acromegaly. It also works to slow or stop tumor growth in patients with neuroendocrine tumors and treat carcinoid syndrome. This medicine may be used for other purposes; ask your health care provider or pharmacist if you have questions. COMMON BRAND NAME(S): Somatuline [place] What should I tell my health care provider before I take this medicine? They need to know if you have any of these conditions: -diabetes -gallbladder disease -heart disease -kidney disease -liver disease -thyroid disease -an unusual or allergic reaction to lanreotide, other medicines, foods, dyes, or preservatives -pregnant or trying to get pregnant -breast-feeding How should I use this medicine? This medicine is for injection under the skin. It is given by a health care professional in a hospital or clinic setting. Contact your pediatrician or health care professional regarding the use of this medicine in children. Special care may be needed. Overdosage: If you think you have taken too much of this medicine contact a poison control center or emergency room at once. NOTE: This medicine is only for you. Do not share this medicine with others. What if I miss a dose? It is important not to miss your dose. Call your doctor or health care professional if you are unable to keep an appointment. What may interact with this medicine? This medicine may interact with the following medications: -bromocriptine -cyclosporine -certain medicines for blood pressure, heart disease, irregular heart beat -certain medicines for diabetes -quinidine -terfenadine This list may not describe all possible interactions. Give your health care provider a list of all the medicines, herbs, non-prescription drugs, or dietary supplements you use. Also tell them if you smoke, drink alcohol, or  use illegal drugs. Some items may interact with your medicine. What should I watch for while using this medicine? Tell your doctor or healthcare professional if your symptoms do not start to get better or if they get worse. Visit your doctor or health care professional for regular checks on your progress. Your condition will be monitored carefully while you are receiving this medicine. You may need blood work done while you are taking this medicine. Women should inform their doctor if they wish to become pregnant or think they might be pregnant. There is a potential for serious side effects to an unborn child. Talk to your health care professional or pharmacist for more information. Do not breast-feed an infant while taking this medicine or for 6 months after stopping it. This medicine has caused ovarian failure in some women. This medicine may interfere with the ability to have a child. Talk with your doctor or health care professional if you are concerned about your fertility. What side effects may I notice from receiving this medicine? Side effects that you should report to your doctor or health care professional as soon as possible: -allergic reactions like skin rash, itching or hives, swelling of the face, lips, or tongue -increased blood pressure -severe stomach pain -signs and symptoms of high blood sugar such as dizziness; dry mouth; dry skin; fruity breath; nausea; stomach pain; increased hunger or thirst; increased urination -signs and symptoms of low blood sugar such as feeling anxious; confusion; dizziness; increased hunger; unusually weak or tired; sweating; shakiness; cold; irritable; headache; blurred vision; fast heartbeat; loss of consciousness -unusually slow heartbeat Side effects that usually do not require medical attention (report to your doctor or health care professional if they continue   or are bothersome): -constipation -diarrhea -dizziness -headache -muscle pain -muscle  spasms -nausea -pain, redness, or irritation at site where injected This list may not describe all possible side effects. Call your doctor for medical advice about side effects. You may report side effects to FDA at 1-800-FDA-1088. Where should I keep my medicine? This drug is given in a hospital or clinic and will not be stored at home. NOTE: This sheet is a summary. It may not cover all possible information. If you have questions about this medicine, talk to your doctor, pharmacist, or health care provider.  2018 Elsevier/Gold Standard (2016-08-27 10:33:47)  

## 2017-04-05 LAB — CHROMOGRANIN A: Chromogranin A: 4 nmol/L (ref 0–5)

## 2017-04-05 MED FILL — FREESTYLE LANCETS: 50 days supply | Qty: 200 | Fill #0

## 2017-04-05 MED FILL — traMADol HCL 50 MG TABS: 50 | 30 days supply | Qty: 90 | Fill #1

## 2017-04-05 MED FILL — FREESTYLE LITE METER: 20 days supply | Qty: 1 | Fill #0

## 2017-04-05 MED FILL — FREESTYLE LITE TEST STRIP: 50 days supply | Qty: 200 | Fill #0

## 2017-04-05 MED FILL — CREON DR 24,000 UNITS CAP: 24000-76000 | 30 days supply | Qty: 270 | Fill #2

## 2017-04-07 MED FILL — LANTUS 100 UNITS/ML VIAL: 100 | 28 days supply | Qty: 10 | Fill #3

## 2017-04-26 MED FILL — GABAPENTIN 300 MG CAPSULE: 300 | 30 days supply | Qty: 180 | Fill #1

## 2017-04-27 ENCOUNTER — Other Ambulatory Visit: Payer: Self-pay | Admitting: *Deleted

## 2017-04-27 DIAGNOSIS — D1809 Hemangioma of other sites: Secondary | ICD-10-CM | POA: Diagnosis not present

## 2017-04-27 DIAGNOSIS — C7A8 Other malignant neuroendocrine tumors: Secondary | ICD-10-CM

## 2017-04-27 DIAGNOSIS — H40021 Open angle with borderline findings, high risk, right eye: Secondary | ICD-10-CM | POA: Diagnosis not present

## 2017-04-27 DIAGNOSIS — Q858 Other phakomatoses, not elsewhere classified: Secondary | ICD-10-CM | POA: Diagnosis not present

## 2017-04-28 ENCOUNTER — Ambulatory Visit (HOSPITAL_BASED_OUTPATIENT_CLINIC_OR_DEPARTMENT_OTHER)
Admission: RE | Admit: 2017-04-28 | Discharge: 2017-04-28 | Disposition: A | Discharge status: Home / Self Care | Payer: 59 | Source: Ambulatory Visit | Attending: Hematology & Oncology | Admitting: Hematology & Oncology

## 2017-04-28 ENCOUNTER — Ambulatory Visit (HOSPITAL_BASED_OUTPATIENT_CLINIC_OR_DEPARTMENT_OTHER): Payer: 59 | Admitting: Hematology & Oncology

## 2017-04-28 ENCOUNTER — Ambulatory Visit (HOSPITAL_BASED_OUTPATIENT_CLINIC_OR_DEPARTMENT_OTHER): Payer: 59

## 2017-04-28 ENCOUNTER — Other Ambulatory Visit: Payer: 59

## 2017-04-28 ENCOUNTER — Other Ambulatory Visit (HOSPITAL_BASED_OUTPATIENT_CLINIC_OR_DEPARTMENT_OTHER): Payer: 59

## 2017-04-28 VITALS — BP 94/70 | HR 75 | Temp 98.2°F | Resp 14

## 2017-04-28 DIAGNOSIS — D500 Iron deficiency anemia secondary to blood loss (chronic): Secondary | ICD-10-CM

## 2017-04-28 DIAGNOSIS — N319 Neuromuscular dysfunction of bladder, unspecified: Secondary | ICD-10-CM

## 2017-04-28 DIAGNOSIS — C7A8 Other malignant neuroendocrine tumors: Secondary | ICD-10-CM

## 2017-04-28 DIAGNOSIS — I700 Atherosclerosis of aorta: Secondary | ICD-10-CM | POA: Diagnosis not present

## 2017-04-28 DIAGNOSIS — N62 Hypertrophy of breast: Secondary | ICD-10-CM | POA: Diagnosis not present

## 2017-04-28 DIAGNOSIS — N200 Calculus of kidney: Secondary | ICD-10-CM | POA: Insufficient documentation

## 2017-04-28 DIAGNOSIS — G9589 Other specified diseases of spinal cord: Secondary | ICD-10-CM | POA: Diagnosis not present

## 2017-04-28 DIAGNOSIS — C7B8 Other secondary neuroendocrine tumors: Secondary | ICD-10-CM

## 2017-04-28 DIAGNOSIS — C787 Secondary malignant neoplasm of liver and intrahepatic bile duct: Secondary | ICD-10-CM | POA: Insufficient documentation

## 2017-04-28 DIAGNOSIS — J0110 Acute frontal sinusitis, unspecified: Secondary | ICD-10-CM | POA: Diagnosis not present

## 2017-04-28 DIAGNOSIS — R918 Other nonspecific abnormal finding of lung field: Secondary | ICD-10-CM | POA: Diagnosis not present

## 2017-04-28 DIAGNOSIS — Z905 Acquired absence of kidney: Secondary | ICD-10-CM | POA: Insufficient documentation

## 2017-04-28 DIAGNOSIS — C801 Malignant (primary) neoplasm, unspecified: Secondary | ICD-10-CM | POA: Diagnosis not present

## 2017-04-28 LAB — CMP (CANCER CENTER ONLY)
ALT(SGPT): 28 U/L (ref 10–47)
AST: 28 U/L (ref 11–38)
Albumin: 3 g/dL — ABNORMAL LOW (ref 3.3–5.5)
Alkaline Phosphatase: 90 U/L — ABNORMAL HIGH (ref 26–84)
BILIRUBIN TOTAL: 0.7 mg/dL (ref 0.20–1.60)
BUN: 13 mg/dL (ref 7–22)
CO2: 29 meq/L (ref 18–33)
Calcium: 8.8 mg/dL (ref 8.0–10.3)
Chloride: 95 mEq/L — ABNORMAL LOW (ref 98–108)
Creat: 0.7 mg/dl (ref 0.6–1.2)
GLUCOSE: 100 mg/dL (ref 73–118)
Potassium: 3.7 mEq/L (ref 3.3–4.7)
SODIUM: 135 meq/L (ref 128–145)
Total Protein: 6.9 g/dL (ref 6.4–8.1)

## 2017-04-28 LAB — CBC WITH DIFFERENTIAL (CANCER CENTER ONLY)
BASO#: 0 10*3/uL (ref 0.0–0.2)
BASO%: 0.3 % (ref 0.0–2.0)
EOS%: 1.4 % (ref 0.0–7.0)
Eosinophils Absolute: 0.1 10*3/uL (ref 0.0–0.5)
HCT: 44 % (ref 38.7–49.9)
HGB: 14.4 g/dL (ref 13.0–17.1)
LYMPH#: 1.1 10*3/uL (ref 0.9–3.3)
LYMPH%: 12.1 % — ABNORMAL LOW (ref 14.0–48.0)
MCH: 26.7 pg — ABNORMAL LOW (ref 28.0–33.4)
MCHC: 32.7 g/dL (ref 32.0–35.9)
MCV: 82 fL (ref 82–98)
MONO#: 0.7 10*3/uL (ref 0.1–0.9)
MONO%: 7.6 % (ref 0.0–13.0)
NEUT#: 7.1 10*3/uL — ABNORMAL HIGH (ref 1.5–6.5)
NEUT%: 78.6 % (ref 40.0–80.0)
PLATELETS: 262 10*3/uL (ref 145–400)
RBC: 5.4 10*6/uL (ref 4.20–5.70)
RDW: 14.5 % (ref 11.1–15.7)
WBC: 9.1 10*3/uL (ref 4.0–10.0)

## 2017-04-28 LAB — LACTATE DEHYDROGENASE: LDH: 190 U/L (ref 125–245)

## 2017-04-28 MED ORDER — LANREOTIDE ACETATE 120 MG/0.5ML ~~LOC~~ SOLN
SUBCUTANEOUS | Status: AC
  Filled 2017-04-28: qty 120

## 2017-04-28 MED ORDER — LANREOTIDE ACETATE 120 MG/0.5ML ~~LOC~~ SOLN
120.0000 mg | Freq: Once | SUBCUTANEOUS | Status: AC
  Administered 2017-04-28: 120 mg via SUBCUTANEOUS

## 2017-04-28 MED ORDER — IOPAMIDOL (ISOVUE-300) INJECTION 61%
100.0000 mL | Freq: Once | INTRAVENOUS | Status: AC | PRN
  Administered 2017-04-28: 100 mL via INTRAVENOUS

## 2017-04-28 NOTE — Patient Instructions (Signed)
Lanreotide injection What is this medicine? LANREOTIDE (lan REE oh tide) is used to reduce blood levels of growth hormone in patients with a condition called acromegaly. It also works to slow or stop tumor growth in patients with neuroendocrine tumors and treat carcinoid syndrome. This medicine may be used for other purposes; ask your health care provider or pharmacist if you have questions. COMMON BRAND NAME(S): Somatuline [place] What should I tell my health care provider before I take this medicine? They need to know if you have any of these conditions: -diabetes -gallbladder disease -heart disease -kidney disease -liver disease -thyroid disease -an unusual or allergic reaction to lanreotide, other medicines, foods, dyes, or preservatives -pregnant or trying to get pregnant -breast-feeding How should I use this medicine? This medicine is for injection under the skin. It is given by a health care professional in a hospital or clinic setting. Contact your pediatrician or health care professional regarding the use of this medicine in children. Special care may be needed. Overdosage: If you think you have taken too much of this medicine contact a poison control center or emergency room at once. NOTE: This medicine is only for you. Do not share this medicine with others. What if I miss a dose? It is important not to miss your dose. Call your doctor or health care professional if you are unable to keep an appointment. What may interact with this medicine? This medicine may interact with the following medications: -bromocriptine -cyclosporine -certain medicines for blood pressure, heart disease, irregular heart beat -certain medicines for diabetes -quinidine -terfenadine This list may not describe all possible interactions. Give your health care provider a list of all the medicines, herbs, non-prescription drugs, or dietary supplements you use. Also tell them if you smoke, drink alcohol, or  use illegal drugs. Some items may interact with your medicine. What should I watch for while using this medicine? Tell your doctor or healthcare professional if your symptoms do not start to get better or if they get worse. Visit your doctor or health care professional for regular checks on your progress. Your condition will be monitored carefully while you are receiving this medicine. You may need blood work done while you are taking this medicine. Women should inform their doctor if they wish to become pregnant or think they might be pregnant. There is a potential for serious side effects to an unborn child. Talk to your health care professional or pharmacist for more information. Do not breast-feed an infant while taking this medicine or for 6 months after stopping it. This medicine has caused ovarian failure in some women. This medicine may interfere with the ability to have a child. Talk with your doctor or health care professional if you are concerned about your fertility. What side effects may I notice from receiving this medicine? Side effects that you should report to your doctor or health care professional as soon as possible: -allergic reactions like skin rash, itching or hives, swelling of the face, lips, or tongue -increased blood pressure -severe stomach pain -signs and symptoms of high blood sugar such as dizziness; dry mouth; dry skin; fruity breath; nausea; stomach pain; increased hunger or thirst; increased urination -signs and symptoms of low blood sugar such as feeling anxious; confusion; dizziness; increased hunger; unusually weak or tired; sweating; shakiness; cold; irritable; headache; blurred vision; fast heartbeat; loss of consciousness -unusually slow heartbeat Side effects that usually do not require medical attention (report to your doctor or health care professional if they continue   or are bothersome): -constipation -diarrhea -dizziness -headache -muscle pain -muscle  spasms -nausea -pain, redness, or irritation at site where injected This list may not describe all possible side effects. Call your doctor for medical advice about side effects. You may report side effects to FDA at 1-800-FDA-1088. Where should I keep my medicine? This drug is given in a hospital or clinic and will not be stored at home. NOTE: This sheet is a summary. It may not cover all possible information. If you have questions about this medicine, talk to your doctor, pharmacist, or health care provider.  2018 Elsevier/Gold Standard (2016-08-27 10:33:47)  

## 2017-04-28 NOTE — Progress Notes (Signed)
Hematology and Oncology Follow Up Visit  [name] [id number] 06/30/1961 56 y.o. 02/25/2017   Principle Diagnosis:   Metastatic neuroendocrine carcinoma  Von Hipple-Lindau Syndrome  Current Therapy:  Temodar/Xeloda - start 03/17/2016 - s/p c#3 - discontinued Somatuline 120mg  SQ q month S/P Y-90 intrahepatic therapy - 07/28/2016     Interim History:  [name] is back for followup. He is doing okay. He is feeling pretty well.   We did go ahead and get a CT scan today. The CT scan, thankfully, did not show any evidence of tumor progression. He has the hepatic tumors which are stable. He has bilateral renal tumors that also are stable. Again there is nothing new with respect to his malignancy.  He has diabetes. His blood sugars have been doing alright. Thankfully they have not been too high or too low.   He's had no bladder issues. He has a chronic indwelling suprapubic catheter.   He does see a neurosurgeon up at [facility]. He is not sure when he has to go back up there.   He has had no problems with his hands. The neuropathy keeps improving every time we see him.   He has had no fever. He has had no bleeding.  His iron studies have been doing pretty well.   Overall, his performance status is ECOG 1.   Medications:  Current Outpatient Prescriptions:  .  dronabinol (MARINOL) 5 MG capsule, TAKE 1 CAPSULE BY MOUTH TWICE DAILY WITH MEALS, Disp: 60 capsule, Rfl: 3 .  gabapentin (NEURONTIN) 300 MG capsule, TAKE 2 CAPSULES BY MOUTH 3 TIMES DAILY., Disp: 180 capsule, Rfl: 6 .  HYDROcodone-acetaminophen (NORCO/VICODIN) 5-325 MG tablet, Take 1-2 tablets by mouth every 6 (six) hours as needed for moderate pain., Disp: 90 tablet, Rfl: 0 .  HYDROmorphone (DILAUDID) 4 MG tablet, Take 4 mg by mouth every 6 (six) hours as needed for severe pain., Disp: , Rfl:  .  insulin glargine (LANTUS) 100 UNIT/ML injection, Inject 31 Units into the skin daily before breakfast. , Disp: , Rfl:  .   insulin lispro (HUMALOG) 100 UNIT/ML injection, Inject 3-9 Units into the skin 3 (three) times daily before meals. SS, Disp: , Rfl:  .  midodrine (PROAMATINE) 5 MG tablet, TKAE 1/2 TABLET BY MOUTH TWICE A DAY AS NEEDED (Patient taking differently: TKAE 1/2 TABLET BY MOUTH TWICE A DAY AS NEEDED BLOOD PRESSURE.), Disp: 60 tablet, Rfl: 6 .  oxybutynin (DITROPAN) 5 MG tablet, , Disp: , Rfl: 3 .  promethazine (PHENERGAN) 12.5 MG tablet, Take 12.5 mg by mouth every 6 (six) hours as needed for nausea. , Disp: , Rfl:  .  tiZANidine (ZANAFLEX) 4 MG tablet, Take 4 mg by mouth every morning., Disp: , Rfl: 3 .  traMADol (ULTRAM) 50 MG tablet, TAKE 1 TABLET BY MOUTH 3 TIMES DAILY, Disp: 90 tablet, Rfl: 2 .  ACCU-CHEK SMARTVIEW test strip, , Disp: , Rfl: 10 .  baclofen (LIORESAL) 20 MG tablet, Take 1-2 tablets (20-40 mg total) by mouth 4 (four) times daily., Disp: 120 each, Rfl: 3 .  Balsam Peru-Castor Oil (VENELEX) OINT, Apply topically as needed, Disp: 60 g, Rfl: 4 .  CREON 24000-76000 units CPEP, TAKE 3 CAPSULES BY MOUTH 3 TIMES DAILY WITH MEALS., Disp: 270 capsule, Rfl: 3 .  diazepam (VALIUM) 5 MG tablet, TAKE 1 TABLET BY MOUTH 3 TIMES DAILY FOR SPASMS/ANXIETY, Disp: 90 tablet, Rfl: 1 .  doxazosin (CARDURA) 1 MG tablet, Take 1 tablet (1 mg total)  by mouth 2 (two) times daily. (Patient taking differently: Take 1 mg by mouth 2 (two) times daily as needed (blood pressure spikes.). ), Disp: 60 tablet, Rfl: 2  Allergies:  Allergies  Allergen Reactions  . Citalopram Diarrhea  . Ciprocin-Fluocin-Procin [Fluocinolone Acetonide] Rash    Pt states this happened with IV Cipro. ABLE TO TAKE PO CIPRO.  . Other Rash    IV-CIPRO    Past Medical History, Surgical history, Social history, and Family History were reviewed and updated.  Review of Systems: As above  Physical Exam:  blood pressure is 93/74.   Well-developed and well-nourished gentleman. He is paralyzed from the waist down. His head exam shows no  ocular or oral lesions. He has no palpable cervical or supraclavicular lymph nodes. His lungs are clear. Cardiac exam regular rate and rhythm with no murmurs, rubs or bruits. Abdomen is soft. Has good bowel sounds. There is no fluid wave. He has a well-healed laparotomy scar is. He has no obvious abdominal mass. There is no obvious liver or spleen tip. Back exam shows a laminectomy scar in the neck and upper thoracic spine. Extremities shows no swelling in his lower legs. He does have the ecchymoses on the inner aspect of his left thigh. This is starting to resolve. He has no strength secondary to the paralysis. Neurological exam is nonfocal. Skin exam shows no rashes.  Lab Results  Component Value Date   WBC 6.7 02/25/2017   HGB 13.2 02/25/2017   HCT 42.1 02/25/2017   MCV 84 02/25/2017   PLT 297 02/25/2017     Chemistry      Component Value Date/Time   NA 139 01/28/2017 0826   NA 138 10/12/2016 0955   K 4.0 01/28/2017 0826   K 3.3 (L) 10/12/2016 0955   CL 103 01/28/2017 0826   CO2 28 01/28/2017 0826   CO2 25 10/12/2016 0955   BUN 17 01/28/2017 0826   BUN 22.0 10/12/2016 0955   CREATININE 0.9 01/28/2017 0826   CREATININE 0.8 10/12/2016 0955      Component Value Date/Time   CALCIUM 8.1 01/28/2017 0826   CALCIUM 9.0 10/12/2016 0955   ALKPHOS 97 (H) 01/28/2017 0826   ALKPHOS 159 (H) 10/12/2016 0955   AST 22 01/28/2017 0826   AST 45 (H) 10/12/2016 0955   ALT 18 01/28/2017 0826   ALT 29 10/12/2016 0955   BILITOT 0.80 01/28/2017 0826   BILITOT 0.62 10/12/2016 0955         Impression and Plan: [name] is 56 year old gentleman with von Hippel-Lindau syndrome. He has multiple neuroendocrine tumors. 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 He's had multiple spinal surgeries.  His CT scans look very reassuring. I'm just very happy that everything is stable.  I don't see that we have to make any changes with his program.  He will get his  Somatuline today.  I will see him back in one more month.    Ned Grace, MD 3/23/20189:16 AM

## 2017-04-29 LAB — CHROMOGRANIN A: CHROMOGRAN A: 4 nmol/L (ref 0–5)

## 2017-04-29 MED FILL — DRONABINOL 5 MG CAPSULE: 5 | 30 days supply | Qty: 60 | Fill #2

## 2017-05-03 MED FILL — BACLOFEN 20 MG TABLET: 20 | 30 days supply | Qty: 120 | Fill #1

## 2017-05-04 ENCOUNTER — Other Ambulatory Visit: Payer: Self-pay | Admitting: Hematology & Oncology

## 2017-05-04 MED FILL — MIDODRINE HCL 5 MG TABLET: 5 | 60 days supply | Qty: 60 | Fill #0

## 2017-05-04 MED FILL — CREON DR 24,000 UNITS CAP: 24000-76000 | 30 days supply | Qty: 270 | Fill #3

## 2017-05-09 MED FILL — LANTUS 100 UNITS/ML VIAL: 100 | 28 days supply | Qty: 10 | Fill #4

## 2017-05-09 MED FILL — traMADol HCL 50 MG TABS: 50 | 30 days supply | Qty: 90 | Fill #2

## 2017-05-12 DIAGNOSIS — E109 Type 1 diabetes mellitus without complications: Secondary | ICD-10-CM | POA: Diagnosis not present

## 2017-05-25 MED FILL — GABAPENTIN 300 MG CAPSULE: 300 | 30 days supply | Qty: 180 | Fill #2

## 2017-06-02 ENCOUNTER — Ambulatory Visit (HOSPITAL_BASED_OUTPATIENT_CLINIC_OR_DEPARTMENT_OTHER): Payer: 59 | Admitting: Hematology & Oncology

## 2017-06-02 ENCOUNTER — Other Ambulatory Visit (HOSPITAL_BASED_OUTPATIENT_CLINIC_OR_DEPARTMENT_OTHER): Payer: 59

## 2017-06-02 ENCOUNTER — Other Ambulatory Visit (HOSPITAL_BASED_OUTPATIENT_CLINIC_OR_DEPARTMENT_OTHER): Payer: 59 | Admitting: *Deleted

## 2017-06-02 ENCOUNTER — Ambulatory Visit (HOSPITAL_BASED_OUTPATIENT_CLINIC_OR_DEPARTMENT_OTHER): Payer: 59

## 2017-06-02 ENCOUNTER — Other Ambulatory Visit: Payer: Self-pay | Admitting: Family

## 2017-06-02 VITALS — BP 114/77 | HR 65 | Temp 97.8°F | Resp 18

## 2017-06-02 DIAGNOSIS — N3000 Acute cystitis without hematuria: Secondary | ICD-10-CM | POA: Diagnosis not present

## 2017-06-02 DIAGNOSIS — Q858 Other phakomatoses, not elsewhere classified: Secondary | ICD-10-CM | POA: Diagnosis not present

## 2017-06-02 DIAGNOSIS — G8389 Other specified paralytic syndromes: Secondary | ICD-10-CM

## 2017-06-02 DIAGNOSIS — D500 Iron deficiency anemia secondary to blood loss (chronic): Secondary | ICD-10-CM

## 2017-06-02 DIAGNOSIS — N390 Urinary tract infection, site not specified: Secondary | ICD-10-CM

## 2017-06-02 DIAGNOSIS — C7B8 Other secondary neuroendocrine tumors: Secondary | ICD-10-CM | POA: Diagnosis not present

## 2017-06-02 DIAGNOSIS — T83511A Infection and inflammatory reaction due to indwelling urethral catheter, initial encounter: Secondary | ICD-10-CM | POA: Diagnosis not present

## 2017-06-02 DIAGNOSIS — N319 Neuromuscular dysfunction of bladder, unspecified: Secondary | ICD-10-CM

## 2017-06-02 DIAGNOSIS — E340 Carcinoid syndrome: Secondary | ICD-10-CM

## 2017-06-02 DIAGNOSIS — C7A8 Other malignant neuroendocrine tumors: Secondary | ICD-10-CM

## 2017-06-02 LAB — URINALYSIS, MICROSCOPIC (CHCC SATELLITE)
BILIRUBIN (URINE): NEGATIVE
GLUCOSE UR: NEGATIVE mg/dL
KETONES: NEGATIVE mg/dL
Nitrite: POSITIVE
PROTEIN: NEGATIVE mg/dL
SPECIFIC GRAVITY, URINE: 1.01 (ref 1.003–1.035)
Urobilinogen, UR: 0.2 mg/dL (ref 0.2–1)
pH: 6 (ref 4.60–8.00)

## 2017-06-02 LAB — CMP (CANCER CENTER ONLY)
ALBUMIN: 2.8 g/dL — AB (ref 3.3–5.5)
ALK PHOS: 78 U/L (ref 26–84)
ALT(SGPT): 21 U/L (ref 10–47)
AST: 22 U/L (ref 11–38)
BUN, Bld: 16 mg/dL (ref 7–22)
CO2: 30 meq/L (ref 18–33)
Calcium: 8.7 mg/dL (ref 8.0–10.3)
Chloride: 95 mEq/L — ABNORMAL LOW (ref 98–108)
Creat: 0.8 mg/dl (ref 0.6–1.2)
GLUCOSE: 239 mg/dL — AB (ref 73–118)
POTASSIUM: 3.7 meq/L (ref 3.3–4.7)
Sodium: 132 mEq/L (ref 128–145)
Total Bilirubin: 0.8 mg/dl (ref 0.20–1.60)
Total Protein: 6.6 g/dL (ref 6.4–8.1)

## 2017-06-02 LAB — CBC WITH DIFFERENTIAL (CANCER CENTER ONLY)
BASO#: 0 10*3/uL (ref 0.0–0.2)
BASO%: 0.1 % (ref 0.0–2.0)
EOS ABS: 0.1 10*3/uL (ref 0.0–0.5)
EOS%: 0.4 % (ref 0.0–7.0)
HCT: 43.7 % (ref 38.7–49.9)
HGB: 14.4 g/dL (ref 13.0–17.1)
LYMPH#: 1.1 10*3/uL (ref 0.9–3.3)
LYMPH%: 9.4 % — ABNORMAL LOW (ref 14.0–48.0)
MCH: 27.2 pg — ABNORMAL LOW (ref 28.0–33.4)
MCHC: 33 g/dL (ref 32.0–35.9)
MCV: 83 fL (ref 82–98)
MONO#: 0.8 10*3/uL (ref 0.1–0.9)
MONO%: 6.3 % (ref 0.0–13.0)
NEUT#: 10.2 10*3/uL — ABNORMAL HIGH (ref 1.5–6.5)
NEUT%: 83.8 % — AB (ref 40.0–80.0)
Platelets: 273 10*3/uL (ref 145–400)
RBC: 5.29 10*6/uL (ref 4.20–5.70)
RDW: 14.4 % (ref 11.1–15.7)
WBC: 12.2 10*3/uL — ABNORMAL HIGH (ref 4.0–10.0)

## 2017-06-02 LAB — IRON AND TIBC
%SAT: 30 % (ref 20–55)
IRON: 84 ug/dL (ref 42–163)
TIBC: 280 ug/dL (ref 202–409)
UIBC: 196 ug/dL (ref 117–376)

## 2017-06-02 LAB — FERRITIN: Ferritin: 69 ng/ml (ref 22–316)

## 2017-06-02 MED ORDER — LANREOTIDE ACETATE 120 MG/0.5ML ~~LOC~~ SOLN
120.0000 mg | Freq: Once | SUBCUTANEOUS | Status: AC
  Administered 2017-06-02: 120 mg via SUBCUTANEOUS

## 2017-06-02 MED ORDER — LANREOTIDE ACETATE 120 MG/0.5ML ~~LOC~~ SOLN
SUBCUTANEOUS | Status: AC
  Filled 2017-06-02: qty 120

## 2017-06-02 NOTE — Progress Notes (Signed)
1 Hematology and Oncology Follow Up Visit  [name] [id number] 1961/05/29 56 y.o. 02/25/2017   Principle Diagnosis:   Metastatic neuroendocrine carcinoma  Von Hipple-Lindau Syndrome  Current Therapy:  Temodar/Xeloda - start 03/17/2016 - s/p c#3 - discontinued Somatuline 120mg  SQ q month S/P Y-90 intrahepatic therapy - 07/28/2016     Interim History:  Mr.  [name] is back for followup.this is probably the best that I have seen him look in a long time. He really looks relaxed. I don't see any obvious distress. He is eating well. His blood sugars on the higher side but his wife does a very good job in monitoring this.  He's had no problems with spasms. He does have a chronic indwelling Foley catheter. His urine looks clear. We did do a urinalysis. However, it looks either as an infection. We will send this off for culture.  His last chromogranin A is normal at 2.  The neuropathy in his hands continues to improve slowly.  He's had no fever. His blood pressure has not been fluctuating too badly.  There's been no nausea or vomiting.  He's had no leg swelling. He's had no rashes.  Overall, his performance status is ECOG 1.   Medications:  Current Outpatient Prescriptions:  .  dronabinol (MARINOL) 5 MG capsule, TAKE 1 CAPSULE BY MOUTH TWICE DAILY WITH MEALS, Disp: 60 capsule, Rfl: 3 .  gabapentin (NEURONTIN) 300 MG capsule, TAKE 2 CAPSULES BY MOUTH 3 TIMES DAILY., Disp: 180 capsule, Rfl: 6 .  HYDROcodone-acetaminophen (NORCO/VICODIN) 5-325 MG tablet, Take 1-2 tablets by mouth every 6 (six) hours as needed for moderate pain., Disp: 90 tablet, Rfl: 0 .  HYDROmorphone (DILAUDID) 4 MG tablet, Take 4 mg by mouth every 6 (six) hours as needed for severe pain., Disp: , Rfl:  .  insulin glargine (LANTUS) 100 UNIT/ML injection, Inject 31 Units into the skin daily before breakfast. , Disp: , Rfl:  .  insulin lispro (HUMALOG) 100 UNIT/ML injection, Inject 3-9 Units into the skin 3  (three) times daily before meals. SS, Disp: , Rfl:  .  midodrine (PROAMATINE) 5 MG tablet, TKAE 1/2 TABLET BY MOUTH TWICE A DAY AS NEEDED (Patient taking differently: TKAE 1/2 TABLET BY MOUTH TWICE A DAY AS NEEDED BLOOD PRESSURE.), Disp: 60 tablet, Rfl: 6 .  oxybutynin (DITROPAN) 5 MG tablet, , Disp: , Rfl: 3 .  promethazine (PHENERGAN) 12.5 MG tablet, Take 12.5 mg by mouth every 6 (six) hours as needed for nausea. , Disp: , Rfl:  .  tiZANidine (ZANAFLEX) 4 MG tablet, Take 4 mg by mouth every morning., Disp: , Rfl: 3 .  traMADol (ULTRAM) 50 MG tablet, TAKE 1 TABLET BY MOUTH 3 TIMES DAILY, Disp: 90 tablet, Rfl: 2 .  ACCU-CHEK SMARTVIEW test strip, , Disp: , Rfl: 10 .  baclofen (LIORESAL) 20 MG tablet, Take 1-2 tablets (20-40 mg total) by mouth 4 (four) times daily., Disp: 120 each, Rfl: 3 .  Balsam Peru-Castor Oil (VENELEX) OINT, Apply topically as needed, Disp: 60 g, Rfl: 4 .  CREON 24000-76000 units CPEP, TAKE 3 CAPSULES BY MOUTH 3 TIMES DAILY WITH MEALS., Disp: 270 capsule, Rfl: 3 .  diazepam (VALIUM) 5 MG tablet, TAKE 1 TABLET BY MOUTH 3 TIMES DAILY FOR SPASMS/ANXIETY, Disp: 90 tablet, Rfl: 1 .  doxazosin (CARDURA) 1 MG tablet, Take 1 tablet (1 mg total) by mouth 2 (two) times daily. (Patient taking differently: Take 1 mg by mouth 2 (two) times daily as needed (blood pressure spikes.). ),  Disp: 60 tablet, Rfl: 2  Allergies:  Allergies  Allergen Reactions  . Citalopram Diarrhea  . Ciprocin-Fluocin-Procin [Fluocinolone Acetonide] Rash    Pt states this happened with IV Cipro. ABLE TO TAKE PO CIPRO.  . Other Rash    IV-CIPRO    Past Medical History, Surgical history, Social history, and Family History were reviewed and updated.  Review of Systems: As above  Physical Exam:  blood pressure is 93/74.   Well-developed and well-nourished gentleman. He is paralyzed from the waist down. His head exam shows no ocular or oral lesions. He has no palpable cervical or supraclavicular lymph nodes.  His lungs are clear. Cardiac exam regular rate and rhythm with no murmurs, rubs or bruits. Abdomen is soft. Has good bowel sounds. There is no fluid wave. He has a well-healed laparotomy scar is. He has no obvious abdominal mass. There is no obvious liver or spleen tip. Back exam shows a laminectomy scar in the neck and upper thoracic spine. Extremities shows no swelling in his lower legs. He does have the ecchymoses on the inner aspect of his left thigh. This is starting to resolve. He has no strength secondary to the paralysis. Neurological exam is nonfocal. Skin exam shows no rashes.  Lab Results  Component Value Date   WBC 6.7 02/25/2017   HGB 13.2 02/25/2017   HCT 42.1 02/25/2017   MCV 84 02/25/2017   PLT 297 02/25/2017     Chemistry      Component Value Date/Time   NA 139 01/28/2017 0826   NA 138 10/12/2016 0955   K 4.0 01/28/2017 0826   K 3.3 (L) 10/12/2016 0955   CL 103 01/28/2017 0826   CO2 28 01/28/2017 0826   CO2 25 10/12/2016 0955   BUN 17 01/28/2017 0826   BUN 22.0 10/12/2016 0955   CREATININE 0.9 01/28/2017 0826   CREATININE 0.8 10/12/2016 0955      Component Value Date/Time   CALCIUM 8.1 01/28/2017 0826   CALCIUM 9.0 10/12/2016 0955   ALKPHOS 97 (H) 01/28/2017 0826   ALKPHOS 159 (H) 10/12/2016 0955   AST 22 01/28/2017 0826   AST 45 (H) 10/12/2016 0955   ALT 18 01/28/2017 0826   ALT 29 10/12/2016 0955   BILITOT 0.80 01/28/2017 0826   BILITOT 0.62 10/12/2016 0955         Impression and Plan: [name] is 56 year old gentleman with von Hippel-Lindau syndrome. He has multiple neuroendocrine tumors. 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 He's had multiple spinal surgeries.  Again, he does look quite good. I'm very happy for him.  The Somatuline seems to be doing a nice job.  We probably do not have to do another CT scan until August.   As always, it is so interesting talking to he and his wife. They are both incredibly  eloquent and are very helpful in getting me to understand technology.   As always, we will see him back in one month.    Ned Grace, MD 3/23/20189:16 AM

## 2017-06-02 NOTE — Patient Instructions (Signed)
Lanreotide injection What is this medicine? LANREOTIDE (lan REE oh tide) is used to reduce blood levels of growth hormone in patients with a condition called acromegaly. It also works to slow or stop tumor growth in patients with neuroendocrine tumors and treat carcinoid syndrome. This medicine may be used for other purposes; ask your health care provider or pharmacist if you have questions. COMMON BRAND NAME(S): Somatuline [place] What should I tell my health care provider before I take this medicine? They need to know if you have any of these conditions: -diabetes -gallbladder disease -heart disease -kidney disease -liver disease -thyroid disease -an unusual or allergic reaction to lanreotide, other medicines, foods, dyes, or preservatives -pregnant or trying to get pregnant -breast-feeding How should I use this medicine? This medicine is for injection under the skin. It is given by a health care professional in a hospital or clinic setting. Contact your pediatrician or health care professional regarding the use of this medicine in children. Special care may be needed. Overdosage: If you think you have taken too much of this medicine contact a poison control center or emergency room at once. NOTE: This medicine is only for you. Do not share this medicine with others. What if I miss a dose? It is important not to miss your dose. Call your doctor or health care professional if you are unable to keep an appointment. What may interact with this medicine? This medicine may interact with the following medications: -bromocriptine -cyclosporine -certain medicines for blood pressure, heart disease, irregular heart beat -certain medicines for diabetes -quinidine -terfenadine This list may not describe all possible interactions. Give your health care provider a list of all the medicines, herbs, non-prescription drugs, or dietary supplements you use. Also tell them if you smoke, drink alcohol, or  use illegal drugs. Some items may interact with your medicine. What should I watch for while using this medicine? Tell your doctor or healthcare professional if your symptoms do not start to get better or if they get worse. Visit your doctor or health care professional for regular checks on your progress. Your condition will be monitored carefully while you are receiving this medicine. You may need blood work done while you are taking this medicine. Women should inform their doctor if they wish to become pregnant or think they might be pregnant. There is a potential for serious side effects to an unborn child. Talk to your health care professional or pharmacist for more information. Do not breast-feed an infant while taking this medicine or for 6 months after stopping it. This medicine has caused ovarian failure in some women. This medicine may interfere with the ability to have a child. Talk with your doctor or health care professional if you are concerned about your fertility. What side effects may I notice from receiving this medicine? Side effects that you should report to your doctor or health care professional as soon as possible: -allergic reactions like skin rash, itching or hives, swelling of the face, lips, or tongue -increased blood pressure -severe stomach pain -signs and symptoms of high blood sugar such as dizziness; dry mouth; dry skin; fruity breath; nausea; stomach pain; increased hunger or thirst; increased urination -signs and symptoms of low blood sugar such as feeling anxious; confusion; dizziness; increased hunger; unusually weak or tired; sweating; shakiness; cold; irritable; headache; blurred vision; fast heartbeat; loss of consciousness -unusually slow heartbeat Side effects that usually do not require medical attention (report to your doctor or health care professional if they continue   or are bothersome): -constipation -diarrhea -dizziness -headache -muscle pain -muscle  spasms -nausea -pain, redness, or irritation at site where injected This list may not describe all possible side effects. Call your doctor for medical advice about side effects. You may report side effects to FDA at 1-800-FDA-1088. Where should I keep my medicine? This drug is given in a hospital or clinic and will not be stored at home. NOTE: This sheet is a summary. It may not cover all possible information. If you have questions about this medicine, talk to your doctor, pharmacist, or health care provider.  2018 Elsevier/Gold Standard (2016-08-27 10:33:47)  

## 2017-06-03 ENCOUNTER — Encounter: Payer: Self-pay | Admitting: *Deleted

## 2017-06-03 LAB — CHROMOGRANIN A: CHROMOGRAN A: 3 nmol/L (ref 0–5)

## 2017-06-06 ENCOUNTER — Telehealth: Payer: Self-pay | Admitting: *Deleted

## 2017-06-06 ENCOUNTER — Other Ambulatory Visit: Payer: Self-pay | Admitting: Hematology & Oncology

## 2017-06-06 ENCOUNTER — Other Ambulatory Visit: Payer: Self-pay | Admitting: Family

## 2017-06-06 DIAGNOSIS — N3000 Acute cystitis without hematuria: Secondary | ICD-10-CM

## 2017-06-06 MED ORDER — AMOXICILLIN-POT CLAVULANATE 875-125 MG PO TABS: 1.0000 | ORAL_TABLET | Freq: Two times a day (BID) | ORAL | 0 refills | Status: DC

## 2017-06-06 MED FILL — FREESTYLE LITE TEST STRIP: 50 days supply | Qty: 200 | Fill #1

## 2017-06-06 MED FILL — CREON DR 24,000 UNITS CAP: 24000-76000 | 30 days supply | Qty: 270 | Fill #0

## 2017-06-06 MED FILL — DRONABINOL 5 MG CAPSULE: 5 | 30 days supply | Qty: 60 | Fill #3

## 2017-06-06 NOTE — Telephone Encounter (Addendum)
Patient is aware of results and prescription  ----- Message from Eliezer Bottom, NP sent at 06/06/2017  8:37 AM EDT ----- Regarding: UTI Patient has UTI. Prescription for Augmentin BID for 5 days sent to [facility]. Thank you!  Sarah   ----- Message ----- From: Interface, Lab In Three Zero One Sent: 06/04/2017   9:39 AM To: Eliezer Bottom, NP

## 2017-06-08 LAB — URINE CULTURE

## 2017-06-09 MED FILL — BACLOFEN 20 MG TABLET: 20 | 30 days supply | Qty: 120 | Fill #2

## 2017-06-10 ENCOUNTER — Other Ambulatory Visit: Payer: Self-pay | Admitting: Hematology & Oncology

## 2017-06-10 MED FILL — traMADol HCL 50 MG TABS: 50 | 30 days supply | Qty: 90 | Fill #0

## 2017-06-13 MED FILL — LANTUS 100 UNITS/ML VIAL: 100 | 28 days supply | Qty: 10 | Fill #5

## 2017-06-22 MED FILL — OXYBUTYNIN 5 MG TABLET: 5 | 90 days supply | Qty: 180 | Fill #1

## 2017-06-29 MED FILL — GABAPENTIN 300 MG CAPSULE: 300 | 30 days supply | Qty: 180 | Fill #3

## 2017-06-30 ENCOUNTER — Ambulatory Visit (HOSPITAL_BASED_OUTPATIENT_CLINIC_OR_DEPARTMENT_OTHER): Payer: 59 | Admitting: Hematology & Oncology

## 2017-06-30 ENCOUNTER — Other Ambulatory Visit (HOSPITAL_BASED_OUTPATIENT_CLINIC_OR_DEPARTMENT_OTHER): Payer: 59

## 2017-06-30 ENCOUNTER — Ambulatory Visit (HOSPITAL_BASED_OUTPATIENT_CLINIC_OR_DEPARTMENT_OTHER): Payer: 59

## 2017-06-30 DIAGNOSIS — C7B8 Other secondary neuroendocrine tumors: Secondary | ICD-10-CM

## 2017-06-30 DIAGNOSIS — E340 Carcinoid syndrome: Secondary | ICD-10-CM

## 2017-06-30 DIAGNOSIS — T83510S Infection and inflammatory reaction due to cystostomy catheter, sequela: Principal | ICD-10-CM

## 2017-06-30 DIAGNOSIS — G8389 Other specified paralytic syndromes: Secondary | ICD-10-CM | POA: Diagnosis not present

## 2017-06-30 DIAGNOSIS — Q858 Other phakomatoses, not elsewhere classified: Secondary | ICD-10-CM

## 2017-06-30 DIAGNOSIS — N390 Urinary tract infection, site not specified: Secondary | ICD-10-CM | POA: Diagnosis not present

## 2017-06-30 DIAGNOSIS — N319 Neuromuscular dysfunction of bladder, unspecified: Secondary | ICD-10-CM

## 2017-06-30 DIAGNOSIS — C7A8 Other malignant neuroendocrine tumors: Secondary | ICD-10-CM | POA: Diagnosis not present

## 2017-06-30 DIAGNOSIS — D729 Disorder of white blood cells, unspecified: Secondary | ICD-10-CM | POA: Diagnosis not present

## 2017-06-30 LAB — CBC WITH DIFFERENTIAL (CANCER CENTER ONLY)
BASO#: 0.1 10*3/uL (ref 0.0–0.2)
BASO%: 0.3 % (ref 0.0–2.0)
EOS%: 0.9 % (ref 0.0–7.0)
Eosinophils Absolute: 0.1 10*3/uL (ref 0.0–0.5)
HCT: 39.3 % (ref 38.7–49.9)
HEMOGLOBIN: 12.8 g/dL — AB (ref 13.0–17.1)
LYMPH#: 1.6 10*3/uL (ref 0.9–3.3)
LYMPH%: 10.6 % — AB (ref 14.0–48.0)
MCH: 27.6 pg — ABNORMAL LOW (ref 28.0–33.4)
MCHC: 32.6 g/dL (ref 32.0–35.9)
MCV: 85 fL (ref 82–98)
MONO#: 1.3 10*3/uL — ABNORMAL HIGH (ref 0.1–0.9)
MONO%: 8.4 % (ref 0.0–13.0)
NEUT%: 79.8 % (ref 40.0–80.0)
NEUTROS ABS: 12 10*3/uL — AB (ref 1.5–6.5)
PLATELETS: 492 10*3/uL — AB (ref 145–400)
RBC: 4.63 10*6/uL (ref 4.20–5.70)
RDW: 14.3 % (ref 11.1–15.7)
WBC: 15.1 10*3/uL — AB (ref 4.0–10.0)

## 2017-06-30 LAB — CMP (CANCER CENTER ONLY)
ALBUMIN: 2.7 g/dL — AB (ref 3.3–5.5)
ALK PHOS: 108 U/L — AB (ref 26–84)
ALT: 22 U/L (ref 10–47)
AST: 25 U/L (ref 11–38)
BUN: 10 mg/dL (ref 7–22)
CHLORIDE: 99 meq/L (ref 98–108)
CO2: 26 mEq/L (ref 18–33)
CREATININE: 0.8 mg/dL (ref 0.6–1.2)
Calcium: 8.6 mg/dL (ref 8.0–10.3)
Glucose, Bld: 104 mg/dL (ref 73–118)
Potassium: 3.7 mEq/L (ref 3.3–4.7)
SODIUM: 131 meq/L (ref 128–145)
TOTAL PROTEIN: 7.6 g/dL (ref 6.4–8.1)
Total Bilirubin: 0.8 mg/dl (ref 0.20–1.60)

## 2017-06-30 MED ORDER — HYDROMORPHONE HCL 4 MG PO TABS: 4.0000 mg | ORAL_TABLET | Freq: Four times a day (QID) | ORAL | 0 refills | Status: DC | PRN

## 2017-06-30 MED ORDER — LANREOTIDE ACETATE 120 MG/0.5ML ~~LOC~~ SOLN
120.0000 mg | Freq: Once | SUBCUTANEOUS | Status: AC
  Administered 2017-06-30: 120 mg via SUBCUTANEOUS

## 2017-06-30 MED ORDER — LANREOTIDE ACETATE 120 MG/0.5ML ~~LOC~~ SOLN
SUBCUTANEOUS | Status: AC
  Filled 2017-06-30: qty 120

## 2017-06-30 MED FILL — HYDROmorphone HCL 4 MG TABS: 4 | 10 days supply | Qty: 40 | Fill #0

## 2017-06-30 NOTE — Patient Instructions (Signed)
Lanreotide injection What is this medicine? LANREOTIDE (lan REE oh tide) is used to reduce blood levels of growth hormone in patients with a condition called acromegaly. It also works to slow or stop tumor growth in patients with neuroendocrine tumors and treat carcinoid syndrome. This medicine may be used for other purposes; ask your health care provider or pharmacist if you have questions. COMMON BRAND NAME(S): Somatuline [place] What should I tell my health care provider before I take this medicine? They need to know if you have any of these conditions: -diabetes -gallbladder disease -heart disease -kidney disease -liver disease -thyroid disease -an unusual or allergic reaction to lanreotide, other medicines, foods, dyes, or preservatives -pregnant or trying to get pregnant -breast-feeding How should I use this medicine? This medicine is for injection under the skin. It is given by a health care professional in a hospital or clinic setting. Contact your pediatrician or health care professional regarding the use of this medicine in children. Special care may be needed. Overdosage: If you think you have taken too much of this medicine contact a poison control center or emergency room at once. NOTE: This medicine is only for you. Do not share this medicine with others. What if I miss a dose? It is important not to miss your dose. Call your doctor or health care professional if you are unable to keep an appointment. What may interact with this medicine? This medicine may interact with the following medications: -bromocriptine -cyclosporine -certain medicines for blood pressure, heart disease, irregular heart beat -certain medicines for diabetes -quinidine -terfenadine This list may not describe all possible interactions. Give your health care provider a list of all the medicines, herbs, non-prescription drugs, or dietary supplements you use. Also tell them if you smoke, drink alcohol, or  use illegal drugs. Some items may interact with your medicine. What should I watch for while using this medicine? Tell your doctor or healthcare professional if your symptoms do not start to get better or if they get worse. Visit your doctor or health care professional for regular checks on your progress. Your condition will be monitored carefully while you are receiving this medicine. You may need blood work done while you are taking this medicine. Women should inform their doctor if they wish to become pregnant or think they might be pregnant. There is a potential for serious side effects to an unborn child. Talk to your health care professional or pharmacist for more information. Do not breast-feed an infant while taking this medicine or for 6 months after stopping it. This medicine has caused ovarian failure in some women. This medicine may interfere with the ability to have a child. Talk with your doctor or health care professional if you are concerned about your fertility. What side effects may I notice from receiving this medicine? Side effects that you should report to your doctor or health care professional as soon as possible: -allergic reactions like skin rash, itching or hives, swelling of the face, lips, or tongue -increased blood pressure -severe stomach pain -signs and symptoms of high blood sugar such as dizziness; dry mouth; dry skin; fruity breath; nausea; stomach pain; increased hunger or thirst; increased urination -signs and symptoms of low blood sugar such as feeling anxious; confusion; dizziness; increased hunger; unusually weak or tired; sweating; shakiness; cold; irritable; headache; blurred vision; fast heartbeat; loss of consciousness -unusually slow heartbeat Side effects that usually do not require medical attention (report to your doctor or health care professional if they continue   or are bothersome): -constipation -diarrhea -dizziness -headache -muscle pain -muscle  spasms -nausea -pain, redness, or irritation at site where injected This list may not describe all possible side effects. Call your doctor for medical advice about side effects. You may report side effects to FDA at 1-800-FDA-1088. Where should I keep my medicine? This drug is given in a hospital or clinic and will not be stored at home. NOTE: This sheet is a summary. It may not cover all possible information. If you have questions about this medicine, talk to your doctor, pharmacist, or health care provider.  2018 Elsevier/Gold Standard (2016-08-27 10:33:47)  

## 2017-06-30 NOTE — Progress Notes (Signed)
1 Hematology and Oncology Follow Up Visit  [name] [id number] [birth year] 56 y.o. 02/25/2017   Principle Diagnosis:   Metastatic neuroendocrine carcinoma  Von Hipple-Lindau Syndrome  Current Therapy:  Temodar/Xeloda - start 03/17/2016 - s/p c#3 - discontinued Somatuline 120mg  SQ q month S/P Y-90 intrahepatic therapy - 07/28/2016     Interim History:  [name] is back for followup.this is probably the best that I have seen him look in a long time. He really looks relaxed. I don't see any obvious distress. He is eating well. His blood sugars on the higher side but his wife does a very good job in monitoring this.  He's had no problems with spasms. He does have a chronic indwelling Foley catheter. He had both enterococcus and Escherichia coli in his urine last time he was here. I suspect this is probably colonization area did   He's had no bleeding. He's had no fever.   His last chromogranin A is normal at 2.  The neuropathy in his hands continues to improve slowly.  There's been no nausea or vomiting.  He's had no leg swelling. He's had no rashes.  Overall, his performance status is ECOG 1.   Medications:  Current Outpatient Prescriptions:  .  dronabinol (MARINOL) 5 MG capsule, TAKE 1 CAPSULE BY MOUTH TWICE DAILY WITH MEALS, Disp: 60 capsule, Rfl: 3 .  gabapentin (NEURONTIN) 300 MG capsule, TAKE 2 CAPSULES BY MOUTH 3 TIMES DAILY., Disp: 180 capsule, Rfl: 6 .  HYDROcodone-acetaminophen (NORCO/VICODIN) 5-325 MG tablet, Take 1-2 tablets by mouth every 6 (six) hours as needed for moderate pain., Disp: 90 tablet, Rfl: 0 .  HYDROmorphone (DILAUDID) 4 MG tablet, Take 4 mg by mouth every 6 (six) hours as needed for severe pain., Disp: , Rfl:  .  insulin glargine (LANTUS) 100 UNIT/ML injection, Inject 31 Units into the skin daily before breakfast. , Disp: , Rfl:  .  insulin lispro (HUMALOG) 100 UNIT/ML injection, Inject 3-9 Units into the skin 3 (three) times daily before  meals. SS, Disp: , Rfl:  .  midodrine (PROAMATINE) 5 MG tablet, TKAE 1/2 TABLET BY MOUTH TWICE A DAY AS NEEDED (Patient taking differently: TKAE 1/2 TABLET BY MOUTH TWICE A DAY AS NEEDED BLOOD PRESSURE.), Disp: 60 tablet, Rfl: 6 .  oxybutynin (DITROPAN) 5 MG tablet, , Disp: , Rfl: 3 .  promethazine (PHENERGAN) 12.5 MG tablet, Take 12.5 mg by mouth every 6 (six) hours as needed for nausea. , Disp: , Rfl:  .  tiZANidine (ZANAFLEX) 4 MG tablet, Take 4 mg by mouth every morning., Disp: , Rfl: 3 .  traMADol (ULTRAM) 50 MG tablet, TAKE 1 TABLET BY MOUTH 3 TIMES DAILY, Disp: 90 tablet, Rfl: 2 .  ACCU-CHEK SMARTVIEW test strip, , Disp: , Rfl: 10 .  baclofen (LIORESAL) 20 MG tablet, Take 1-2 tablets (20-40 mg total) by mouth 4 (four) times daily., Disp: 120 each, Rfl: 3 .  Balsam Peru-Castor Oil (VENELEX) OINT, Apply topically as needed, Disp: 60 g, Rfl: 4 .  CREON 24000-76000 units CPEP, TAKE 3 CAPSULES BY MOUTH 3 TIMES DAILY WITH MEALS., Disp: 270 capsule, Rfl: 3 .  diazepam (VALIUM) 5 MG tablet, TAKE 1 TABLET BY MOUTH 3 TIMES DAILY FOR SPASMS/ANXIETY, Disp: 90 tablet, Rfl: 1 .  doxazosin (CARDURA) 1 MG tablet, Take 1 tablet (1 mg total) by mouth 2 (two) times daily. (Patient taking differently: Take 1 mg by mouth 2 (two) times daily as needed (blood pressure spikes.). ), Disp: 54  tablet, Rfl: 2  Allergies:  Allergies  Allergen Reactions  . Citalopram Diarrhea  . Ciprocin-Fluocin-Procin [Fluocinolone Acetonide] Rash    Pt states this happened with IV Cipro. ABLE TO TAKE PO CIPRO.  . Other Rash    IV-CIPRO    Past Medical History, Surgical history, Social history, and Family History were reviewed and updated.  Review of Systems: As above  Physical Exam:  blood pressure is 93/74.   Well-developed and well-nourished gentleman. He is paralyzed from the waist down. His head exam shows no ocular or oral lesions. He has no palpable cervical or supraclavicular lymph nodes. His lungs are clear.  Cardiac exam regular rate and rhythm with no murmurs, rubs or bruits. Abdomen is soft. Has good bowel sounds. There is no fluid wave. He has a well-healed laparotomy scar is. He has no obvious abdominal mass. There is no obvious liver or spleen tip. Back exam shows a laminectomy scar in the neck and upper thoracic spine. Extremities shows no swelling in his lower legs. He does have the ecchymoses on the inner aspect of his left thigh. This is starting to resolve. He has no strength secondary to the paralysis. Neurological exam is nonfocal. Skin exam shows no rashes.  Lab Results  Component Value Date   WBC 6.7 02/25/2017   HGB 13.2 02/25/2017   HCT 42.1 02/25/2017   MCV 84 02/25/2017   PLT 297 02/25/2017     Chemistry      Component Value Date/Time   NA 139 01/28/2017 0826   NA 138 10/12/2016 0955   K 4.0 01/28/2017 0826   K 3.3 (L) 10/12/2016 0955   CL 103 01/28/2017 0826   CO2 28 01/28/2017 0826   CO2 25 10/12/2016 0955   BUN 17 01/28/2017 0826   BUN 22.0 10/12/2016 0955   CREATININE 0.9 01/28/2017 0826   CREATININE 0.8 10/12/2016 0955      Component Value Date/Time   CALCIUM 8.1 01/28/2017 0826   CALCIUM 9.0 10/12/2016 0955   ALKPHOS 97 (H) 01/28/2017 0826   ALKPHOS 159 (H) 10/12/2016 0955   AST 22 01/28/2017 0826   AST 45 (H) 10/12/2016 0955   ALT 18 01/28/2017 0826   ALT 29 10/12/2016 0955   BILITOT 0.80 01/28/2017 0826   BILITOT 0.62 10/12/2016 0955         Impression and Plan: [name] is 56 year old gentleman with von Hippel-Lindau syndrome. He has multiple neuroendocrine tumors. 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 He's had multiple spinal surgeries.  Not sure why his white cell count is up a little bit. We will have to check his urine again. I suspect he probably has some element of urinary colonization.  We have to set him up with a CT scan will see him back.  He will get his Somatuline today.   Volanda Napoleon,  MD 3/23/20189:16 AM

## 2017-07-01 ENCOUNTER — Other Ambulatory Visit: Payer: Self-pay | Admitting: *Deleted

## 2017-07-01 ENCOUNTER — Telehealth: Payer: Self-pay | Admitting: *Deleted

## 2017-07-01 DIAGNOSIS — C7A8 Other malignant neuroendocrine tumors: Secondary | ICD-10-CM

## 2017-07-01 DIAGNOSIS — N390 Urinary tract infection, site not specified: Secondary | ICD-10-CM

## 2017-07-01 DIAGNOSIS — N319 Neuromuscular dysfunction of bladder, unspecified: Secondary | ICD-10-CM

## 2017-07-01 DIAGNOSIS — T83510S Infection and inflammatory reaction due to cystostomy catheter, sequela: Principal | ICD-10-CM

## 2017-07-01 LAB — CHROMOGRANIN A: CHROMOGRAN A: 4 nmol/L (ref 0–5)

## 2017-07-01 LAB — IRON AND TIBC
%SAT: 15 % — ABNORMAL LOW (ref 20–55)
Iron: 35 ug/dL — ABNORMAL LOW (ref 42–163)
TIBC: 228 ug/dL (ref 202–409)
UIBC: 193 ug/dL (ref 117–376)

## 2017-07-01 LAB — FERRITIN: FERRITIN: 187 ng/mL (ref 22–316)

## 2017-07-01 NOTE — Telephone Encounter (Addendum)
Patient's wife aware of results. Appointment made  ----- Message from Volanda Napoleon, MD sent at 07/01/2017  1:52 PM EDT ----- Call - iron levels have dropped quite a bit!!  I think 1 dose of Feraheme will do the job!!!  Please set up for 1-2 weeks!!  Thanks!!  pete

## 2017-07-04 ENCOUNTER — Other Ambulatory Visit: Payer: Self-pay | Admitting: Hematology & Oncology

## 2017-07-04 ENCOUNTER — Other Ambulatory Visit (HOSPITAL_BASED_OUTPATIENT_CLINIC_OR_DEPARTMENT_OTHER): Payer: 59

## 2017-07-04 ENCOUNTER — Ambulatory Visit (HOSPITAL_BASED_OUTPATIENT_CLINIC_OR_DEPARTMENT_OTHER): Payer: 59

## 2017-07-04 ENCOUNTER — Other Ambulatory Visit: Payer: Self-pay | Admitting: Family

## 2017-07-04 ENCOUNTER — Other Ambulatory Visit: Payer: Self-pay | Admitting: *Deleted

## 2017-07-04 DIAGNOSIS — C7A8 Other malignant neuroendocrine tumors: Secondary | ICD-10-CM

## 2017-07-04 DIAGNOSIS — N319 Neuromuscular dysfunction of bladder, unspecified: Secondary | ICD-10-CM

## 2017-07-04 DIAGNOSIS — D509 Iron deficiency anemia, unspecified: Secondary | ICD-10-CM | POA: Diagnosis not present

## 2017-07-04 LAB — URINALYSIS, MICROSCOPIC (CHCC SATELLITE)
Bilirubin (Urine): NEGATIVE
GLUCOSE UR: NEGATIVE mg/dL
Ketones: NEGATIVE mg/dL
Nitrite: POSITIVE
Protein: NEGATIVE mg/dL
SPECIFIC GRAVITY, URINE: 1.01 (ref 1.003–1.035)
UROBILINOGEN UR: 0.2 mg/dL (ref 0.2–1)
pH: 6 (ref 4.60–8.00)

## 2017-07-04 MED ORDER — SODIUM CHLORIDE 0.9 % IV SOLN
Freq: Once | INTRAVENOUS | Status: AC
  Administered 2017-07-04: 11:00:00 via INTRAVENOUS

## 2017-07-04 MED ORDER — SODIUM CHLORIDE 0.9 % IV SOLN
510.0000 mg | Freq: Once | INTRAVENOUS | Status: AC
  Administered 2017-07-04: 510 mg via INTRAVENOUS
  Filled 2017-07-04: qty 17

## 2017-07-04 MED FILL — BACLOFEN 20 MG TABLET: 20 | 30 days supply | Qty: 120 | Fill #3

## 2017-07-04 MED FILL — CREON DR 24,000 UNITS CAP: 24000-76000 | 30 days supply | Qty: 270 | Fill #1

## 2017-07-04 MED FILL — DRONABINOL 5 MG CAPSULE: 5 | 30 days supply | Qty: 60 | Fill #0

## 2017-07-04 MED FILL — MIDODRINE HCL 5 MG TABLET: 5 | 60 days supply | Qty: 60 | Fill #1

## 2017-07-04 NOTE — Patient Instructions (Signed)

## 2017-07-05 DIAGNOSIS — H35371 Puckering of macula, right eye: Secondary | ICD-10-CM | POA: Diagnosis not present

## 2017-07-05 DIAGNOSIS — D1809 Hemangioma of other sites: Secondary | ICD-10-CM | POA: Diagnosis not present

## 2017-07-06 ENCOUNTER — Other Ambulatory Visit: Payer: Self-pay | Admitting: Family

## 2017-07-06 LAB — URINE CULTURE

## 2017-07-08 MED FILL — traMADol HCL 50 MG TABS: 50 | 30 days supply | Qty: 90 | Fill #1

## 2017-07-13 MED FILL — LANTUS 100 UNITS/ML VIAL: 100 | 28 days supply | Qty: 10 | Fill #0

## 2017-07-28 ENCOUNTER — Ambulatory Visit (HOSPITAL_BASED_OUTPATIENT_CLINIC_OR_DEPARTMENT_OTHER): Payer: 59 | Admitting: Hematology & Oncology

## 2017-07-28 ENCOUNTER — Ambulatory Visit (HOSPITAL_BASED_OUTPATIENT_CLINIC_OR_DEPARTMENT_OTHER): Payer: 59

## 2017-07-28 ENCOUNTER — Other Ambulatory Visit (HOSPITAL_BASED_OUTPATIENT_CLINIC_OR_DEPARTMENT_OTHER): Payer: 59

## 2017-07-28 ENCOUNTER — Ambulatory Visit (HOSPITAL_BASED_OUTPATIENT_CLINIC_OR_DEPARTMENT_OTHER)
Admission: RE | Admit: 2017-07-28 | Discharge: 2017-07-28 | Disposition: A | Discharge status: Home / Self Care | Payer: 59 | Source: Ambulatory Visit | Attending: Hematology & Oncology | Admitting: Hematology & Oncology

## 2017-07-28 ENCOUNTER — Encounter (HOSPITAL_BASED_OUTPATIENT_CLINIC_OR_DEPARTMENT_OTHER): Payer: Self-pay

## 2017-07-28 VITALS — BP 107/72 | HR 67 | Resp 18 | Wt 144.0 lb

## 2017-07-28 DIAGNOSIS — C7B8 Other secondary neuroendocrine tumors: Secondary | ICD-10-CM

## 2017-07-28 DIAGNOSIS — F324 Major depressive disorder, single episode, in partial remission: Secondary | ICD-10-CM | POA: Diagnosis not present

## 2017-07-28 DIAGNOSIS — Q858 Other phakomatoses, not elsewhere classified: Secondary | ICD-10-CM

## 2017-07-28 DIAGNOSIS — E340 Carcinoid syndrome: Secondary | ICD-10-CM

## 2017-07-28 DIAGNOSIS — N2889 Other specified disorders of kidney and ureter: Secondary | ICD-10-CM | POA: Insufficient documentation

## 2017-07-28 DIAGNOSIS — C7A8 Other malignant neuroendocrine tumors: Secondary | ICD-10-CM

## 2017-07-28 DIAGNOSIS — X58XXXS Exposure to other specified factors, sequela: Secondary | ICD-10-CM | POA: Diagnosis not present

## 2017-07-28 DIAGNOSIS — J9811 Atelectasis: Secondary | ICD-10-CM | POA: Diagnosis not present

## 2017-07-28 DIAGNOSIS — T83510S Infection and inflammatory reaction due to cystostomy catheter, sequela: Principal | ICD-10-CM

## 2017-07-28 DIAGNOSIS — D480 Neoplasm of uncertain behavior of bone and articular cartilage: Secondary | ICD-10-CM | POA: Diagnosis not present

## 2017-07-28 DIAGNOSIS — N390 Urinary tract infection, site not specified: Secondary | ICD-10-CM | POA: Insufficient documentation

## 2017-07-28 DIAGNOSIS — D500 Iron deficiency anemia secondary to blood loss (chronic): Secondary | ICD-10-CM

## 2017-07-28 DIAGNOSIS — N319 Neuromuscular dysfunction of bladder, unspecified: Secondary | ICD-10-CM

## 2017-07-28 DIAGNOSIS — K769 Liver disease, unspecified: Secondary | ICD-10-CM | POA: Insufficient documentation

## 2017-07-28 LAB — CBC WITH DIFFERENTIAL (CANCER CENTER ONLY)
BASO#: 0 10*3/uL (ref 0.0–0.2)
BASO%: 0.3 % (ref 0.0–2.0)
EOS ABS: 0.1 10*3/uL (ref 0.0–0.5)
EOS%: 0.9 % (ref 0.0–7.0)
HCT: 39.7 % (ref 38.7–49.9)
HGB: 12.8 g/dL — ABNORMAL LOW (ref 13.0–17.1)
LYMPH#: 1.5 10*3/uL (ref 0.9–3.3)
LYMPH%: 13.5 % — AB (ref 14.0–48.0)
MCH: 28.1 pg (ref 28.0–33.4)
MCHC: 32.2 g/dL (ref 32.0–35.9)
MCV: 87 fL (ref 82–98)
MONO#: 1.1 10*3/uL — ABNORMAL HIGH (ref 0.1–0.9)
MONO%: 9.7 % (ref 0.0–13.0)
NEUT#: 8.4 10*3/uL — ABNORMAL HIGH (ref 1.5–6.5)
NEUT%: 75.6 % (ref 40.0–80.0)
PLATELETS: 260 10*3/uL (ref 145–400)
RBC: 4.56 10*6/uL (ref 4.20–5.70)
RDW: 15.2 % (ref 11.1–15.7)
WBC: 11.1 10*3/uL — AB (ref 4.0–10.0)

## 2017-07-28 LAB — CMP (CANCER CENTER ONLY)
ALT(SGPT): 12 U/L (ref 10–47)
AST: 22 U/L (ref 11–38)
Albumin: 3 g/dL — ABNORMAL LOW (ref 3.3–5.5)
Alkaline Phosphatase: 87 U/L — ABNORMAL HIGH (ref 26–84)
BUN: 15 mg/dL (ref 7–22)
CHLORIDE: 94 meq/L — AB (ref 98–108)
CO2: 29 meq/L (ref 18–33)
CREATININE: 0.8 mg/dL (ref 0.6–1.2)
Calcium: 8.5 mg/dL (ref 8.0–10.3)
GLUCOSE: 178 mg/dL — AB (ref 73–118)
Potassium: 3.7 mEq/L (ref 3.3–4.7)
SODIUM: 129 meq/L (ref 128–145)
TOTAL PROTEIN: 7.3 g/dL (ref 6.4–8.1)
Total Bilirubin: 0.8 mg/dl (ref 0.20–1.60)

## 2017-07-28 MED ORDER — LANREOTIDE ACETATE 120 MG/0.5ML ~~LOC~~ SOLN
SUBCUTANEOUS | Status: AC
  Filled 2017-07-28: qty 120

## 2017-07-28 MED ORDER — IOPAMIDOL (ISOVUE-300) INJECTION 61%
100.0000 mL | Freq: Once | INTRAVENOUS | Status: AC | PRN
  Administered 2017-07-28: 100 mL via INTRAVENOUS

## 2017-07-28 MED ORDER — LANREOTIDE ACETATE 120 MG/0.5ML ~~LOC~~ SOLN
120.0000 mg | Freq: Once | SUBCUTANEOUS | Status: AC
  Administered 2017-07-28: 120 mg via SUBCUTANEOUS

## 2017-07-28 MED FILL — GABAPENTIN 300 MG CAPSULE: 300 | 30 days supply | Qty: 180 | Fill #4

## 2017-07-28 MED FILL — HUMALOG 100 UNITS/ML KWIKPE: 100 | 60 days supply | Qty: 15 | Fill #1

## 2017-07-28 NOTE — Progress Notes (Signed)
1 Hematology and Oncology Follow Up Visit  [name] [id number] October 06, 1961 56 y.o. 02/25/2017   Principle Diagnosis:   Metastatic neuroendocrine carcinoma  Von Hipple-Lindau Syndrome  Current Therapy:  Temodar/Xeloda - start 03/17/2016 - s/p c#3 - discontinued Somatuline 120mg  SQ q month S/P Y-90 intrahepatic therapy - 07/28/2016     Interim History:  [name] is back for follow-up. We did go ahead and get another CT scan on him. The radiologist said there was a "mixed response" in the liver. A couple lesions were smaller a couple lesions were slightly larger. There were no new lesions.  His urinary tract infections seem to be under good control.  We did go ahead and give him some iron at his last visit. This did make him feel a little bit better.  He's had no problem with his diabetes. His wife does a great job giving him his insulin.  He's had no fever. He's had no change in his bowels. His stools are somewhat loose.  He's had no cough. He's had no bleeding.  Overall, his performance status is ECOG 1.   Medications:  Current Outpatient Prescriptions:  .  dronabinol (MARINOL) 5 MG capsule, TAKE 1 CAPSULE BY MOUTH TWICE DAILY WITH MEALS, Disp: 60 capsule, Rfl: 3 .  gabapentin (NEURONTIN) 300 MG capsule, TAKE 2 CAPSULES BY MOUTH 3 TIMES DAILY., Disp: 180 capsule, Rfl: 6 .  HYDROcodone-acetaminophen (NORCO/VICODIN) 5-325 MG tablet, Take 1-2 tablets by mouth every 6 (six) hours as needed for moderate pain., Disp: 90 tablet, Rfl: 0 .  HYDROmorphone (DILAUDID) 4 MG tablet, Take 4 mg by mouth every 6 (six) hours as needed for severe pain., Disp: , Rfl:  .  insulin glargine (LANTUS) 100 UNIT/ML injection, Inject 31 Units into the skin daily before breakfast. , Disp: , Rfl:  .  insulin lispro (HUMALOG) 100 UNIT/ML injection, Inject 3-9 Units into the skin 3 (three) times daily before meals. SS, Disp: , Rfl:  .  midodrine (PROAMATINE) 5 MG tablet, TKAE 1/2 TABLET BY MOUTH  TWICE A DAY AS NEEDED (Patient taking differently: TKAE 1/2 TABLET BY MOUTH TWICE A DAY AS NEEDED BLOOD PRESSURE.), Disp: 60 tablet, Rfl: 6 .  oxybutynin (DITROPAN) 5 MG tablet, , Disp: , Rfl: 3 .  promethazine (PHENERGAN) 12.5 MG tablet, Take 12.5 mg by mouth every 6 (six) hours as needed for nausea. , Disp: , Rfl:  .  tiZANidine (ZANAFLEX) 4 MG tablet, Take 4 mg by mouth every morning., Disp: , Rfl: 3 .  traMADol (ULTRAM) 50 MG tablet, TAKE 1 TABLET BY MOUTH 3 TIMES DAILY, Disp: 90 tablet, Rfl: 2 .  ACCU-CHEK SMARTVIEW test strip, , Disp: , Rfl: 10 .  baclofen (LIORESAL) 20 MG tablet, Take 1-2 tablets (20-40 mg total) by mouth 4 (four) times daily., Disp: 120 each, Rfl: 3 .  Balsam Peru-Castor Oil (VENELEX) OINT, Apply topically as needed, Disp: 60 g, Rfl: 4 .  CREON 24000-76000 units CPEP, TAKE 3 CAPSULES BY MOUTH 3 TIMES DAILY WITH MEALS., Disp: 270 capsule, Rfl: 3 .  diazepam (VALIUM) 5 MG tablet, TAKE 1 TABLET BY MOUTH 3 TIMES DAILY FOR SPASMS/ANXIETY, Disp: 90 tablet, Rfl: 1 .  doxazosin (CARDURA) 1 MG tablet, Take 1 tablet (1 mg total) by mouth 2 (two) times daily. (Patient taking differently: Take 1 mg by mouth 2 (two) times daily as needed (blood pressure spikes.). ), Disp: 60 tablet, Rfl: 2  Allergies:  Allergies  Allergen Reactions  . Citalopram Diarrhea  .  Ciprocin-Fluocin-Procin [Fluocinolone Acetonide] Rash    Pt states this happened with IV Cipro. ABLE TO TAKE PO CIPRO.  . Other Rash    IV-CIPRO    Past Medical History, Surgical history, Social history, and Family History were reviewed and updated.  Review of Systems: As above  Physical Exam:  Well-developed white male. His vital signs show a temperature of 98. Pulse 627. Blood pressure 107/72. His weight was estimated to be 144. Head and neck exam shows no scleral icterus. He has no oral lesions. There is no adenopathy in the neck. Lungs are clear. Cardiac exam regular rate and rhythm. Abdomen is soft. He has a suprapubic  catheter in place. There is some slight abdominal distention. Bowel sounds are present. He has no guarding or rebound tenderness. There is no abdominal mass. There is no palpable liver edge. Extremities shows the paraplegia. Skin exam shows occasional ecchymoses. Neurological exam shows the paraplegia.   Lab Results  Component Value Date   WBC 6.7 02/25/2017   HGB 13.2 02/25/2017   HCT 42.1 02/25/2017   MCV 84 02/25/2017   PLT 297 02/25/2017     Chemistry      Component Value Date/Time   NA 139 01/28/2017 0826   NA 138 10/12/2016 0955   K 4.0 01/28/2017 0826   K 3.3 (L) 10/12/2016 0955   CL 103 01/28/2017 0826   CO2 28 01/28/2017 0826   CO2 25 10/12/2016 0955   BUN 17 01/28/2017 0826   BUN 22.0 10/12/2016 0955   CREATININE 0.9 01/28/2017 0826   CREATININE 0.8 10/12/2016 0955      Component Value Date/Time   CALCIUM 8.1 01/28/2017 0826   CALCIUM 9.0 10/12/2016 0955   ALKPHOS 97 (H) 01/28/2017 0826   ALKPHOS 159 (H) 10/12/2016 0955   AST 22 01/28/2017 0826   AST 45 (H) 10/12/2016 0955   ALT 18 01/28/2017 0826   ALT 29 10/12/2016 0955   BILITOT 0.80 01/28/2017 0826   BILITOT 0.62 10/12/2016 0955         Impression and Plan: [name] is A 56 year old white male. He has metastatic neuroendocrine tumors. He has the one Hippel-Lindau syndrome. He currently is on Somatuline. He has had past intrahepatic therapy with Y-90.  From my perspective, the CT scan looks stable.  We will not make any changes.  We will plan to see him back in another 4 weeks.  We will have to monitor his iron levels.  I'm so thankful that his wife is doing such a great job.  Volanda Napoleon, MD 3/23/20189:16 AM

## 2017-07-29 LAB — CHROMOGRANIN A: CHROMOGRAN A: 3 nmol/L (ref 0–5)

## 2017-07-29 MED FILL — FREESTYLE LITE TEST STRIP: 50 days supply | Qty: 200 | Fill #2

## 2017-08-03 ENCOUNTER — Other Ambulatory Visit: Payer: Self-pay | Admitting: *Deleted

## 2017-08-03 DIAGNOSIS — C7A8 Other malignant neuroendocrine tumors: Secondary | ICD-10-CM

## 2017-08-03 DIAGNOSIS — D500 Iron deficiency anemia secondary to blood loss (chronic): Secondary | ICD-10-CM

## 2017-08-03 MED ORDER — BACLOFEN 20 MG PO TABS: 20.0000 mg | ORAL_TABLET | Freq: Four times a day (QID) | ORAL | 3 refills | Status: DC

## 2017-08-12 MED FILL — traMADol HCL 50 MG TABS: 50 | 30 days supply | Qty: 90 | Fill #2

## 2017-08-12 MED FILL — DRONABINOL 5 MG CAPSULE: 5 | 30 days supply | Qty: 60 | Fill #1

## 2017-08-12 MED FILL — BACLOFEN 20 MG TABLET: 20 | 15 days supply | Qty: 120 | Fill #0

## 2017-08-12 MED FILL — CREON DR 24,000 UNITS CAP: 24000-76000 | 30 days supply | Qty: 270 | Fill #2

## 2017-08-16 MED FILL — LANTUS 100 UNITS/ML VIAL: 100 | 28 days supply | Qty: 10 | Fill #1

## 2017-08-17 ENCOUNTER — Other Ambulatory Visit: Payer: Self-pay | Admitting: Family

## 2017-08-24 ENCOUNTER — Ambulatory Visit (HOSPITAL_BASED_OUTPATIENT_CLINIC_OR_DEPARTMENT_OTHER): Payer: 59

## 2017-08-24 ENCOUNTER — Ambulatory Visit (HOSPITAL_BASED_OUTPATIENT_CLINIC_OR_DEPARTMENT_OTHER): Payer: 59 | Admitting: Hematology & Oncology

## 2017-08-24 ENCOUNTER — Other Ambulatory Visit (HOSPITAL_BASED_OUTPATIENT_CLINIC_OR_DEPARTMENT_OTHER): Payer: 59

## 2017-08-24 VITALS — BP 100/63 | HR 88 | Temp 98.6°F | Resp 20

## 2017-08-24 DIAGNOSIS — D500 Iron deficiency anemia secondary to blood loss (chronic): Secondary | ICD-10-CM

## 2017-08-24 DIAGNOSIS — K909 Intestinal malabsorption, unspecified: Secondary | ICD-10-CM

## 2017-08-24 DIAGNOSIS — C7B8 Other secondary neuroendocrine tumors: Secondary | ICD-10-CM

## 2017-08-24 DIAGNOSIS — E340 Carcinoid syndrome: Secondary | ICD-10-CM | POA: Diagnosis not present

## 2017-08-24 DIAGNOSIS — Z862 Personal history of diseases of the blood and blood-forming organs and certain disorders involving the immune mechanism: Secondary | ICD-10-CM | POA: Diagnosis not present

## 2017-08-24 DIAGNOSIS — C7A8 Other malignant neuroendocrine tumors: Secondary | ICD-10-CM

## 2017-08-24 DIAGNOSIS — R197 Diarrhea, unspecified: Principal | ICD-10-CM

## 2017-08-24 DIAGNOSIS — G8389 Other specified paralytic syndromes: Secondary | ICD-10-CM | POA: Diagnosis not present

## 2017-08-24 DIAGNOSIS — Q858 Other phakomatoses, not elsewhere classified: Secondary | ICD-10-CM

## 2017-08-24 LAB — CMP (CANCER CENTER ONLY)
ALK PHOS: 104 U/L — AB (ref 26–84)
ALT: 20 U/L (ref 10–47)
AST: 29 U/L (ref 11–38)
Albumin: 2.8 g/dL — ABNORMAL LOW (ref 3.3–5.5)
BUN: 13 mg/dL (ref 7–22)
CO2: 26 mEq/L (ref 18–33)
Calcium: 8.5 mg/dL (ref 8.0–10.3)
Chloride: 101 mEq/L (ref 98–108)
Creat: 0.8 mg/dl (ref 0.6–1.2)
GLUCOSE: 258 mg/dL — AB (ref 73–118)
POTASSIUM: 4 meq/L (ref 3.3–4.7)
Sodium: 135 mEq/L (ref 128–145)
Total Bilirubin: 0.6 mg/dl (ref 0.20–1.60)
Total Protein: 6.9 g/dL (ref 6.4–8.1)

## 2017-08-24 LAB — CBC WITH DIFFERENTIAL (CANCER CENTER ONLY)
BASO#: 0 10*3/uL (ref 0.0–0.2)
BASO%: 0.3 % (ref 0.0–2.0)
EOS%: 0.7 % (ref 0.0–7.0)
Eosinophils Absolute: 0.1 10*3/uL (ref 0.0–0.5)
HCT: 42.7 % (ref 38.7–49.9)
HGB: 13.9 g/dL (ref 13.0–17.1)
LYMPH#: 1.1 10*3/uL (ref 0.9–3.3)
LYMPH%: 12 % — AB (ref 14.0–48.0)
MCH: 27.9 pg — ABNORMAL LOW (ref 28.0–33.4)
MCHC: 32.6 g/dL (ref 32.0–35.9)
MCV: 86 fL (ref 82–98)
MONO#: 0.6 10*3/uL (ref 0.1–0.9)
MONO%: 6.2 % (ref 0.0–13.0)
NEUT#: 7.4 10*3/uL — ABNORMAL HIGH (ref 1.5–6.5)
NEUT%: 80.8 % — AB (ref 40.0–80.0)
PLATELETS: 264 10*3/uL (ref 145–400)
RBC: 4.98 10*6/uL (ref 4.20–5.70)
RDW: 14.5 % (ref 11.1–15.7)
WBC: 9.2 10*3/uL (ref 4.0–10.0)

## 2017-08-24 MED ORDER — DIPHENOXYLATE-ATROPINE 2.5-0.025 MG PO TABS: 1.0000 | ORAL_TABLET | Freq: Four times a day (QID) | ORAL | 0 refills | Status: DC | PRN

## 2017-08-24 MED ORDER — LANREOTIDE ACETATE 120 MG/0.5ML ~~LOC~~ SOLN
SUBCUTANEOUS | Status: AC
  Filled 2017-08-24: qty 120

## 2017-08-24 MED ORDER — LANREOTIDE ACETATE 120 MG/0.5ML ~~LOC~~ SOLN
120.0000 mg | Freq: Once | SUBCUTANEOUS | Status: AC
  Administered 2017-08-24: 120 mg via SUBCUTANEOUS

## 2017-08-24 NOTE — Patient Instructions (Signed)
Lanreotide injection What is this medicine? LANREOTIDE (lan REE oh tide) is used to reduce blood levels of growth hormone in patients with a condition called acromegaly. It also works to slow or stop tumor growth in patients with neuroendocrine tumors and treat carcinoid syndrome. This medicine may be used for other purposes; ask your health care provider or pharmacist if you have questions. COMMON BRAND NAME(S): Somatuline [place] What should I tell my health care provider before I take this medicine? They need to know if you have any of these conditions: -diabetes -gallbladder disease -heart disease -kidney disease -liver disease -thyroid disease -an unusual or allergic reaction to lanreotide, other medicines, foods, dyes, or preservatives -pregnant or trying to get pregnant -breast-feeding How should I use this medicine? This medicine is for injection under the skin. It is given by a health care professional in a hospital or clinic setting. Contact your pediatrician or health care professional regarding the use of this medicine in children. Special care may be needed. Overdosage: If you think you have taken too much of this medicine contact a poison control center or emergency room at once. NOTE: This medicine is only for you. Do not share this medicine with others. What if I miss a dose? It is important not to miss your dose. Call your doctor or health care professional if you are unable to keep an appointment. What may interact with this medicine? This medicine may interact with the following medications: -bromocriptine -cyclosporine -certain medicines for blood pressure, heart disease, irregular heart beat -certain medicines for diabetes -quinidine -terfenadine This list may not describe all possible interactions. Give your health care provider a list of all the medicines, herbs, non-prescription drugs, or dietary supplements you use. Also tell them if you smoke, drink alcohol, or  use illegal drugs. Some items may interact with your medicine. What should I watch for while using this medicine? Tell your doctor or healthcare professional if your symptoms do not start to get better or if they get worse. Visit your doctor or health care professional for regular checks on your progress. Your condition will be monitored carefully while you are receiving this medicine. You may need blood work done while you are taking this medicine. Women should inform their doctor if they wish to become pregnant or think they might be pregnant. There is a potential for serious side effects to an unborn child. Talk to your health care professional or pharmacist for more information. Do not breast-feed an infant while taking this medicine or for 6 months after stopping it. This medicine has caused ovarian failure in some women. This medicine may interfere with the ability to have a child. Talk with your doctor or health care professional if you are concerned about your fertility. What side effects may I notice from receiving this medicine? Side effects that you should report to your doctor or health care professional as soon as possible: -allergic reactions like skin rash, itching or hives, swelling of the face, lips, or tongue -increased blood pressure -severe stomach pain -signs and symptoms of high blood sugar such as dizziness; dry mouth; dry skin; fruity breath; nausea; stomach pain; increased hunger or thirst; increased urination -signs and symptoms of low blood sugar such as feeling anxious; confusion; dizziness; increased hunger; unusually weak or tired; sweating; shakiness; cold; irritable; headache; blurred vision; fast heartbeat; loss of consciousness -unusually slow heartbeat Side effects that usually do not require medical attention (report to your doctor or health care professional if they continue   or are bothersome): -constipation -diarrhea -dizziness -headache -muscle pain -muscle  spasms -nausea -pain, redness, or irritation at site where injected This list may not describe all possible side effects. Call your doctor for medical advice about side effects. You may report side effects to FDA at 1-800-FDA-1088. Where should I keep my medicine? This drug is given in a hospital or clinic and will not be stored at home. NOTE: This sheet is a summary. It may not cover all possible information. If you have questions about this medicine, talk to your doctor, pharmacist, or health care provider.  2018 Elsevier/Gold Standard (2016-08-27 10:33:47)  
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
## 2017-08-25 LAB — IRON AND TIBC
%SAT: 14 % — AB (ref 20–55)
IRON: 35 ug/dL — AB (ref 42–163)
TIBC: 239 ug/dL (ref 202–409)
UIBC: 204 ug/dL (ref 117–376)

## 2017-08-25 LAB — FERRITIN: Ferritin: 124 ng/ml (ref 22–316)

## 2017-08-26 ENCOUNTER — Telehealth: Payer: Self-pay | Admitting: *Deleted

## 2017-08-26 LAB — CHROMOGRANIN A: CHROMOGRAN A: 5 nmol/L (ref 0–5)

## 2017-08-26 NOTE — Telephone Encounter (Signed)
Left message on patient personal voice mail to call to set up iron appt per dr. Marin Olp request.

## 2017-08-26 NOTE — Telephone Encounter (Signed)
-----   Message from Volanda Napoleon, MD sent at 08/25/2017 12:52 PM EDT ----- Call - iron is actually low!!  Needs 1 dose of feraheme!!  Please set up!!  pete

## 2017-08-28 MED FILL — GABAPENTIN 300 MG CAPSULE: 300 | 30 days supply | Qty: 180 | Fill #5

## 2017-08-29 ENCOUNTER — Ambulatory Visit (HOSPITAL_BASED_OUTPATIENT_CLINIC_OR_DEPARTMENT_OTHER): Payer: 59

## 2017-08-29 VITALS — BP 134/89 | HR 58 | Temp 97.6°F | Resp 16

## 2017-08-29 DIAGNOSIS — E611 Iron deficiency: Secondary | ICD-10-CM | POA: Diagnosis not present

## 2017-08-29 DIAGNOSIS — D500 Iron deficiency anemia secondary to blood loss (chronic): Secondary | ICD-10-CM

## 2017-08-29 MED ORDER — SODIUM CHLORIDE 0.9 % IV SOLN
Freq: Once | INTRAVENOUS | Status: AC
  Administered 2017-08-29: 16:00:00 via INTRAVENOUS

## 2017-08-29 MED ORDER — SODIUM CHLORIDE 0.9 % IV SOLN
510.0000 mg | Freq: Once | INTRAVENOUS | Status: AC
  Administered 2017-08-29: 510 mg via INTRAVENOUS
  Filled 2017-08-29: qty 17

## 2017-08-29 MED FILL — DIPHENOXYLATE/ATROPINE TAB: 2.5-0.025 | 25 days supply | Qty: 100 | Fill #0

## 2017-08-29 NOTE — Patient Instructions (Signed)

## 2017-09-05 MED FILL — MIDODRINE HCL 5 MG TABLET: 5 | 60 days supply | Qty: 60 | Fill #2

## 2017-09-05 MED FILL — tiZANidine HCL 4 MG TABS: 4 | 4 days supply | Qty: 30 | Fill #1

## 2017-09-07 ENCOUNTER — Encounter: Payer: Self-pay | Admitting: Hematology & Oncology

## 2017-09-07 NOTE — Progress Notes (Unsigned)
Faxed FMLA to [employer] for spouse Tanna Furry: [phone]       COPY SCANNED

## 2017-09-08 ENCOUNTER — Other Ambulatory Visit: Payer: Self-pay | Admitting: Hematology & Oncology

## 2017-09-08 ENCOUNTER — Telehealth: Payer: Self-pay | Admitting: *Deleted

## 2017-09-08 MED FILL — CREON DR 24,000 UNITS CAP: 24000-76000 | 30 days supply | Qty: 270 | Fill #3

## 2017-09-08 NOTE — Telephone Encounter (Signed)
Faxed refill script for tramadol to [facility]

## 2017-09-09 MED FILL — traMADol HCL 50 MG TABS: 50 | 30 days supply | Qty: 90 | Fill #0

## 2017-09-13 MED FILL — BACLOFEN 20 MG TABLET: 20 | 15 days supply | Qty: 120 | Fill #1

## 2017-09-13 MED FILL — LANTUS 100 UNITS/ML VIAL: 100 | 28 days supply | Qty: 10 | Fill #2

## 2017-09-13 MED FILL — DRONABINOL 5 MG CAPSULE: 5 | 30 days supply | Qty: 60 | Fill #2

## 2017-09-22 ENCOUNTER — Other Ambulatory Visit: Payer: Self-pay | Admitting: *Deleted

## 2017-09-22 ENCOUNTER — Ambulatory Visit (HOSPITAL_BASED_OUTPATIENT_CLINIC_OR_DEPARTMENT_OTHER): Payer: 59 | Admitting: Hematology & Oncology

## 2017-09-22 ENCOUNTER — Other Ambulatory Visit (HOSPITAL_BASED_OUTPATIENT_CLINIC_OR_DEPARTMENT_OTHER): Payer: 59

## 2017-09-22 ENCOUNTER — Ambulatory Visit (HOSPITAL_BASED_OUTPATIENT_CLINIC_OR_DEPARTMENT_OTHER): Payer: 59

## 2017-09-22 VITALS — BP 106/61 | HR 92 | Temp 97.8°F | Resp 18

## 2017-09-22 DIAGNOSIS — K909 Intestinal malabsorption, unspecified: Secondary | ICD-10-CM | POA: Diagnosis not present

## 2017-09-22 DIAGNOSIS — C7A8 Other malignant neuroendocrine tumors: Secondary | ICD-10-CM

## 2017-09-22 DIAGNOSIS — R197 Diarrhea, unspecified: Principal | ICD-10-CM

## 2017-09-22 DIAGNOSIS — C7B8 Other secondary neuroendocrine tumors: Secondary | ICD-10-CM

## 2017-09-22 DIAGNOSIS — D500 Iron deficiency anemia secondary to blood loss (chronic): Secondary | ICD-10-CM

## 2017-09-22 DIAGNOSIS — E340 Carcinoid syndrome: Secondary | ICD-10-CM

## 2017-09-22 DIAGNOSIS — G8389 Other specified paralytic syndromes: Secondary | ICD-10-CM | POA: Diagnosis not present

## 2017-09-22 DIAGNOSIS — Q8583 Von Hippel-Lindau syndrome: Secondary | ICD-10-CM | POA: Insufficient documentation

## 2017-09-22 DIAGNOSIS — Q858 Other phakomatoses, not elsewhere classified: Secondary | ICD-10-CM

## 2017-09-22 DIAGNOSIS — N319 Neuromuscular dysfunction of bladder, unspecified: Secondary | ICD-10-CM

## 2017-09-22 LAB — CMP (CANCER CENTER ONLY)
ALK PHOS: 94 U/L — AB (ref 26–84)
ALT: 15 U/L (ref 10–47)
AST: 25 U/L (ref 11–38)
Albumin: 2.6 g/dL — ABNORMAL LOW (ref 3.3–5.5)
BUN: 14 mg/dL (ref 7–22)
CALCIUM: 8.7 mg/dL (ref 8.0–10.3)
CO2: 25 meq/L (ref 18–33)
Chloride: 106 mEq/L (ref 98–108)
Creat: 1 mg/dl (ref 0.6–1.2)
GLUCOSE: 153 mg/dL — AB (ref 73–118)
POTASSIUM: 3.3 meq/L (ref 3.3–4.7)
Sodium: 136 mEq/L (ref 128–145)
Total Bilirubin: 0.6 mg/dl (ref 0.20–1.60)
Total Protein: 6.8 g/dL (ref 6.4–8.1)

## 2017-09-22 LAB — CBC WITH DIFFERENTIAL (CANCER CENTER ONLY)
BASO#: 0 10*3/uL (ref 0.0–0.2)
BASO%: 0.2 % (ref 0.0–2.0)
EOS%: 0.8 % (ref 0.0–7.0)
Eosinophils Absolute: 0.1 10*3/uL (ref 0.0–0.5)
HEMATOCRIT: 40.8 % (ref 38.7–49.9)
HGB: 13.6 g/dL (ref 13.0–17.1)
LYMPH#: 1.1 10*3/uL (ref 0.9–3.3)
LYMPH%: 9.2 % — AB (ref 14.0–48.0)
MCH: 28.4 pg (ref 28.0–33.4)
MCHC: 33.3 g/dL (ref 32.0–35.9)
MCV: 85 fL (ref 82–98)
MONO#: 0.9 10*3/uL (ref 0.1–0.9)
MONO%: 7.7 % (ref 0.0–13.0)
NEUT#: 9.7 10*3/uL — ABNORMAL HIGH (ref 1.5–6.5)
NEUT%: 82.1 % — AB (ref 40.0–80.0)
PLATELETS: 225 10*3/uL (ref 145–400)
RBC: 4.79 10*6/uL (ref 4.20–5.70)
RDW: 13.3 % (ref 11.1–15.7)
WBC: 11.8 10*3/uL — ABNORMAL HIGH (ref 4.0–10.0)

## 2017-09-22 MED ORDER — LANREOTIDE ACETATE 120 MG/0.5ML ~~LOC~~ SOLN
120.0000 mg | Freq: Once | SUBCUTANEOUS | Status: AC
  Administered 2017-09-22: 120 mg via SUBCUTANEOUS

## 2017-09-22 MED ORDER — LANREOTIDE ACETATE 120 MG/0.5ML ~~LOC~~ SOLN
SUBCUTANEOUS | Status: AC
  Filled 2017-09-22: qty 120

## 2017-09-22 NOTE — Patient Instructions (Signed)
Lanreotide injection What is this medicine? LANREOTIDE (lan REE oh tide) is used to reduce blood levels of growth hormone in patients with a condition called acromegaly. It also works to slow or stop tumor growth in patients with neuroendocrine tumors and treat carcinoid syndrome. This medicine may be used for other purposes; ask your health care provider or pharmacist if you have questions. COMMON BRAND NAME(S): Somatuline [place] What should I tell my health care provider before I take this medicine? They need to know if you have any of these conditions: -diabetes -gallbladder disease -heart disease -kidney disease -liver disease -thyroid disease -an unusual or allergic reaction to lanreotide, other medicines, foods, dyes, or preservatives -pregnant or trying to get pregnant -breast-feeding How should I use this medicine? This medicine is for injection under the skin. It is given by a health care professional in a hospital or clinic setting. Contact your pediatrician or health care professional regarding the use of this medicine in children. Special care may be needed. Overdosage: If you think you have taken too much of this medicine contact a poison control center or emergency room at once. NOTE: This medicine is only for you. Do not share this medicine with others. What if I miss a dose? It is important not to miss your dose. Call your doctor or health care professional if you are unable to keep an appointment. What may interact with this medicine? This medicine may interact with the following medications: -bromocriptine -cyclosporine -certain medicines for blood pressure, heart disease, irregular heart beat -certain medicines for diabetes -quinidine -terfenadine This list may not describe all possible interactions. Give your health care provider a list of all the medicines, herbs, non-prescription drugs, or dietary supplements you use. Also tell them if you smoke, drink alcohol, or  use illegal drugs. Some items may interact with your medicine. What should I watch for while using this medicine? Tell your doctor or healthcare professional if your symptoms do not start to get better or if they get worse. Visit your doctor or health care professional for regular checks on your progress. Your condition will be monitored carefully while you are receiving this medicine. You may need blood work done while you are taking this medicine. Women should inform their doctor if they wish to become pregnant or think they might be pregnant. There is a potential for serious side effects to an unborn child. Talk to your health care professional or pharmacist for more information. Do not breast-feed an infant while taking this medicine or for 6 months after stopping it. This medicine has caused ovarian failure in some women. This medicine may interfere with the ability to have a child. Talk with your doctor or health care professional if you are concerned about your fertility. What side effects may I notice from receiving this medicine? Side effects that you should report to your doctor or health care professional as soon as possible: -allergic reactions like skin rash, itching or hives, swelling of the face, lips, or tongue -increased blood pressure -severe stomach pain -signs and symptoms of high blood sugar such as dizziness; dry mouth; dry skin; fruity breath; nausea; stomach pain; increased hunger or thirst; increased urination -signs and symptoms of low blood sugar such as feeling anxious; confusion; dizziness; increased hunger; unusually weak or tired; sweating; shakiness; cold; irritable; headache; blurred vision; fast heartbeat; loss of consciousness -unusually slow heartbeat Side effects that usually do not require medical attention (report to your doctor or health care professional if they continue   or are bothersome): -constipation -diarrhea -dizziness -headache -muscle pain -muscle  spasms -nausea -pain, redness, or irritation at site where injected This list may not describe all possible side effects. Call your doctor for medical advice about side effects. You may report side effects to FDA at 1-800-FDA-1088. Where should I keep my medicine? This drug is given in a hospital or clinic and will not be stored at home. NOTE: This sheet is a summary. It may not cover all possible information. If you have questions about this medicine, talk to your doctor, pharmacist, or health care provider.  2018 Elsevier/Gold Standard (2016-08-27 10:33:47)  
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
## 2017-09-23 LAB — CHROMOGRANIN A: Chromogranin A: 3 nmol/L (ref 0–5)

## 2017-09-23 LAB — IRON AND TIBC
%SAT: 16 % — AB (ref 20–55)
Iron: 31 ug/dL — ABNORMAL LOW (ref 42–163)
TIBC: 194 ug/dL — ABNORMAL LOW (ref 202–409)
UIBC: 163 ug/dL (ref 117–376)

## 2017-09-23 LAB — RETICULOCYTES: RETICULOCYTE COUNT: 0.8 % (ref 0.6–2.6)

## 2017-09-23 LAB — FERRITIN: Ferritin: 302 ng/ml (ref 22–316)

## 2017-09-26 MED FILL — GABAPENTIN 300 MG CAPSULE: 300 | 30 days supply | Qty: 180 | Fill #6

## 2017-09-27 ENCOUNTER — Encounter: Payer: Self-pay | Admitting: Hematology & Oncology

## 2017-09-28 MED FILL — FREESTYLE LITE TEST STRIP: 50 days supply | Qty: 200 | Fill #3

## 2017-10-11 ENCOUNTER — Other Ambulatory Visit: Payer: Self-pay | Admitting: Hematology & Oncology

## 2017-10-11 DIAGNOSIS — D1809 Hemangioma of other sites: Secondary | ICD-10-CM | POA: Diagnosis not present

## 2017-10-11 DIAGNOSIS — H35371 Puckering of macula, right eye: Secondary | ICD-10-CM | POA: Diagnosis not present

## 2017-10-11 DIAGNOSIS — Q858 Other phakomatoses, not elsewhere classified: Secondary | ICD-10-CM | POA: Diagnosis not present

## 2017-10-11 MED FILL — traMADol HCL 50 MG TABS: 50 | 30 days supply | Qty: 90 | Fill #1

## 2017-10-11 MED FILL — diazePAM 5 MG TABS: 5 | 30 days supply | Qty: 90 | Fill #0

## 2017-10-11 MED FILL — CREON DR 24,000 UNITS CAP: 24000-76000 | 30 days supply | Qty: 270 | Fill #0

## 2017-10-18 MED FILL — DRONABINOL 5 MG CAP: 5 | 30 days supply | Qty: 60 | Fill #3

## 2017-10-18 MED FILL — LANTUS 100 UNITS/ML VIAL: 100 | 28 days supply | Qty: 10 | Fill #3

## 2017-10-18 MED FILL — OXYBUTYNIN 5 MG TABLET: 5 | 90 days supply | Qty: 180 | Fill #2

## 2017-10-20 ENCOUNTER — Other Ambulatory Visit: Payer: Self-pay | Admitting: Hematology & Oncology

## 2017-10-20 DIAGNOSIS — K909 Intestinal malabsorption, unspecified: Secondary | ICD-10-CM

## 2017-10-20 DIAGNOSIS — C7A8 Other malignant neuroendocrine tumors: Secondary | ICD-10-CM

## 2017-10-20 DIAGNOSIS — R197 Diarrhea, unspecified: Principal | ICD-10-CM

## 2017-10-20 MED FILL — DIPHENOXYLATE/ATROPINE TAB: 2.5-0.025 | 25 days supply | Qty: 100 | Fill #0

## 2017-10-28 ENCOUNTER — Other Ambulatory Visit: Payer: Self-pay | Admitting: Hematology & Oncology

## 2017-10-28 DIAGNOSIS — C7A8 Other malignant neuroendocrine tumors: Secondary | ICD-10-CM

## 2017-10-28 DIAGNOSIS — Z23 Encounter for immunization: Secondary | ICD-10-CM

## 2017-10-28 MED FILL — GABAPENTIN 300 MG CAPSULE: 300 | 30 days supply | Qty: 180 | Fill #0

## 2017-11-01 ENCOUNTER — Ambulatory Visit (HOSPITAL_BASED_OUTPATIENT_CLINIC_OR_DEPARTMENT_OTHER): Payer: 59

## 2017-11-01 ENCOUNTER — Other Ambulatory Visit: Payer: Self-pay

## 2017-11-01 ENCOUNTER — Encounter (HOSPITAL_BASED_OUTPATIENT_CLINIC_OR_DEPARTMENT_OTHER): Payer: Self-pay

## 2017-11-01 ENCOUNTER — Ambulatory Visit (HOSPITAL_BASED_OUTPATIENT_CLINIC_OR_DEPARTMENT_OTHER)
Admission: RE | Admit: 2017-11-01 | Discharge: 2017-11-01 | Disposition: A | Discharge status: Home / Self Care | Payer: 59 | Source: Ambulatory Visit | Attending: Hematology & Oncology | Admitting: Hematology & Oncology

## 2017-11-01 ENCOUNTER — Other Ambulatory Visit (HOSPITAL_BASED_OUTPATIENT_CLINIC_OR_DEPARTMENT_OTHER): Payer: 59

## 2017-11-01 ENCOUNTER — Encounter: Payer: Self-pay | Admitting: Hematology & Oncology

## 2017-11-01 ENCOUNTER — Ambulatory Visit (HOSPITAL_BASED_OUTPATIENT_CLINIC_OR_DEPARTMENT_OTHER): Payer: 59 | Admitting: Hematology & Oncology

## 2017-11-01 ENCOUNTER — Encounter: Payer: Self-pay | Admitting: *Deleted

## 2017-11-01 VITALS — BP 107/73 | HR 72 | Resp 16

## 2017-11-01 DIAGNOSIS — D500 Iron deficiency anemia secondary to blood loss (chronic): Secondary | ICD-10-CM

## 2017-11-01 DIAGNOSIS — Q858 Other phakomatoses, not elsewhere classified: Secondary | ICD-10-CM | POA: Diagnosis not present

## 2017-11-01 DIAGNOSIS — Q8583 Von Hippel-Lindau syndrome: Secondary | ICD-10-CM

## 2017-11-01 DIAGNOSIS — N319 Neuromuscular dysfunction of bladder, unspecified: Secondary | ICD-10-CM | POA: Insufficient documentation

## 2017-11-01 DIAGNOSIS — R918 Other nonspecific abnormal finding of lung field: Secondary | ICD-10-CM | POA: Diagnosis not present

## 2017-11-01 DIAGNOSIS — C787 Secondary malignant neoplasm of liver and intrahepatic bile duct: Secondary | ICD-10-CM | POA: Insufficient documentation

## 2017-11-01 DIAGNOSIS — C7A8 Other malignant neuroendocrine tumors: Secondary | ICD-10-CM

## 2017-11-01 DIAGNOSIS — C7B8 Other secondary neuroendocrine tumors: Secondary | ICD-10-CM

## 2017-11-01 DIAGNOSIS — E340 Carcinoid syndrome: Secondary | ICD-10-CM

## 2017-11-01 DIAGNOSIS — I880 Nonspecific mesenteric lymphadenitis: Secondary | ICD-10-CM | POA: Diagnosis not present

## 2017-11-01 DIAGNOSIS — G8389 Other specified paralytic syndromes: Secondary | ICD-10-CM

## 2017-11-01 DIAGNOSIS — N2889 Other specified disorders of kidney and ureter: Secondary | ICD-10-CM | POA: Insufficient documentation

## 2017-11-01 DIAGNOSIS — Z23 Encounter for immunization: Secondary | ICD-10-CM

## 2017-11-01 LAB — IRON AND TIBC
%SAT: 25 % (ref 20–55)
IRON: 66 ug/dL (ref 42–163)
TIBC: 268 ug/dL (ref 202–409)
UIBC: 201 ug/dL (ref 117–376)

## 2017-11-01 LAB — CMP (CANCER CENTER ONLY)
ALT(SGPT): 22 U/L (ref 10–47)
AST: 27 U/L (ref 11–38)
Albumin: 3.2 g/dL — ABNORMAL LOW (ref 3.3–5.5)
Alkaline Phosphatase: 111 U/L — ABNORMAL HIGH (ref 26–84)
BUN, Bld: 17 mg/dL (ref 7–22)
CO2: 29 meq/L (ref 18–33)
Calcium: 8.5 mg/dL (ref 8.0–10.3)
Chloride: 102 meq/L (ref 98–108)
Creat: 0.8 mg/dL (ref 0.6–1.2)
Glucose, Bld: 108 mg/dL (ref 73–118)
Potassium: 4.3 meq/L (ref 3.3–4.7)
Sodium: 143 meq/L (ref 128–145)
Total Bilirubin: 0.6 mg/dL (ref 0.20–1.60)
Total Protein: 7.5 g/dL (ref 6.4–8.1)

## 2017-11-01 LAB — CBC WITH DIFFERENTIAL (CANCER CENTER ONLY)
BASO#: 0 10*3/uL (ref 0.0–0.2)
BASO%: 0.3 % (ref 0.0–2.0)
EOS ABS: 0.1 10*3/uL (ref 0.0–0.5)
EOS%: 1.6 % (ref 0.0–7.0)
HCT: 45.9 % (ref 38.7–49.9)
HEMOGLOBIN: 15.1 g/dL (ref 13.0–17.1)
LYMPH#: 1.1 10*3/uL (ref 0.9–3.3)
LYMPH%: 14.1 % (ref 14.0–48.0)
MCH: 28.3 pg (ref 28.0–33.4)
MCHC: 32.9 g/dL (ref 32.0–35.9)
MCV: 86 fL (ref 82–98)
MONO#: 0.6 10*3/uL (ref 0.1–0.9)
MONO%: 8.4 % (ref 0.0–13.0)
NEUT%: 75.6 % (ref 40.0–80.0)
NEUTROS ABS: 5.7 10*3/uL (ref 1.5–6.5)
Platelets: 202 10*3/uL (ref 145–400)
RBC: 5.33 10*6/uL (ref 4.20–5.70)
RDW: 13.8 % (ref 11.1–15.7)
WBC: 7.5 10*3/uL (ref 4.0–10.0)

## 2017-11-01 LAB — LACTATE DEHYDROGENASE: LDH: 166 U/L (ref 125–245)

## 2017-11-01 LAB — FERRITIN: Ferritin: 159 ng/ml (ref 22–316)

## 2017-11-01 MED ORDER — IOPAMIDOL (ISOVUE-300) INJECTION 61%: 100.0000 mL | Freq: Once | INTRAVENOUS | Status: DC | PRN

## 2017-11-01 MED ORDER — LANREOTIDE ACETATE 120 MG/0.5ML ~~LOC~~ SOLN
SUBCUTANEOUS | Status: AC
  Filled 2017-11-01: qty 120

## 2017-11-01 MED ORDER — LANREOTIDE ACETATE 120 MG/0.5ML ~~LOC~~ SOLN
120.0000 mg | Freq: Once | SUBCUTANEOUS | Status: AC
  Administered 2017-11-01: 120 mg via SUBCUTANEOUS

## 2017-11-01 MED ORDER — HYDROCODONE-ACETAMINOPHEN 5-325 MG PO TABS: 1.0000 | ORAL_TABLET | Freq: Four times a day (QID) | ORAL | 0 refills | Status: DC | PRN

## 2017-11-01 MED FILL — HYDROCODON-APAP 5-325: 5-325 | 11 days supply | Qty: 90 | Fill #0

## 2017-11-01 NOTE — Patient Instructions (Signed)
Lanreotide injection What is this medicine? LANREOTIDE (lan REE oh tide) is used to reduce blood levels of growth hormone in patients with a condition called acromegaly. It also works to slow or stop tumor growth in patients with neuroendocrine tumors and treat carcinoid syndrome. This medicine may be used for other purposes; ask your health care provider or pharmacist if you have questions. COMMON BRAND NAME(S): Somatuline [place] What should I tell my health care provider before I take this medicine? They need to know if you have any of these conditions: -diabetes -gallbladder disease -heart disease -kidney disease -liver disease -thyroid disease -an unusual or allergic reaction to lanreotide, other medicines, foods, dyes, or preservatives -pregnant or trying to get pregnant -breast-feeding How should I use this medicine? This medicine is for injection under the skin. It is given by a health care professional in a hospital or clinic setting. Contact your pediatrician or health care professional regarding the use of this medicine in children. Special care may be needed. Overdosage: If you think you have taken too much of this medicine contact a poison control center or emergency room at once. NOTE: This medicine is only for you. Do not share this medicine with others. What if I miss a dose? It is important not to miss your dose. Call your doctor or health care professional if you are unable to keep an appointment. What may interact with this medicine? This medicine may interact with the following medications: -bromocriptine -cyclosporine -certain medicines for blood pressure, heart disease, irregular heart beat -certain medicines for diabetes -quinidine -terfenadine This list may not describe all possible interactions. Give your health care provider a list of all the medicines, herbs, non-prescription drugs, or dietary supplements you use. Also tell them if you smoke, drink alcohol, or  use illegal drugs. Some items may interact with your medicine. What should I watch for while using this medicine? Tell your doctor or healthcare professional if your symptoms do not start to get better or if they get worse. Visit your doctor or health care professional for regular checks on your progress. Your condition will be monitored carefully while you are receiving this medicine. You may need blood work done while you are taking this medicine. Women should inform their doctor if they wish to become pregnant or think they might be pregnant. There is a potential for serious side effects to an unborn child. Talk to your health care professional or pharmacist for more information. Do not breast-feed an infant while taking this medicine or for 6 months after stopping it. This medicine has caused ovarian failure in some women. This medicine may interfere with the ability to have a child. Talk with your doctor or health care professional if you are concerned about your fertility. What side effects may I notice from receiving this medicine? Side effects that you should report to your doctor or health care professional as soon as possible: -allergic reactions like skin rash, itching or hives, swelling of the face, lips, or tongue -increased blood pressure -severe stomach pain -signs and symptoms of high blood sugar such as dizziness; dry mouth; dry skin; fruity breath; nausea; stomach pain; increased hunger or thirst; increased urination -signs and symptoms of low blood sugar such as feeling anxious; confusion; dizziness; increased hunger; unusually weak or tired; sweating; shakiness; cold; irritable; headache; blurred vision; fast heartbeat; loss of consciousness -unusually slow heartbeat Side effects that usually do not require medical attention (report to your doctor or health care professional if they continue   or are bothersome): -constipation -diarrhea -dizziness -headache -muscle pain -muscle  spasms -nausea -pain, redness, or irritation at site where injected This list may not describe all possible side effects. Call your doctor for medical advice about side effects. You may report side effects to FDA at 1-800-FDA-1088. Where should I keep my medicine? This drug is given in a hospital or clinic and will not be stored at home. NOTE: This sheet is a summary. It may not cover all possible information. If you have questions about this medicine, talk to your doctor, pharmacist, or health care provider.  2018 Elsevier/Gold Standard (2016-08-27 10:33:47)  
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
## 2017-11-02 LAB — CHROMOGRANIN A: Chromogranin A: 3 nmol/L (ref 0–5)

## 2017-11-02 MED FILL — BACLOFEN 20 MG TABLET: 20 | 15 days supply | Qty: 120 | Fill #2

## 2017-11-11 DIAGNOSIS — E109 Type 1 diabetes mellitus without complications: Secondary | ICD-10-CM | POA: Diagnosis not present

## 2017-11-11 DIAGNOSIS — Z23 Encounter for immunization: Secondary | ICD-10-CM | POA: Diagnosis not present

## 2017-11-11 MED FILL — traMADol HCL 50 MG TABS: 50 | 30 days supply | Qty: 90 | Fill #2

## 2017-11-14 MED FILL — CREON DR 24,000 UNITS CAP: 24000-76000 | 30 days supply | Qty: 270 | Fill #1

## 2017-11-22 ENCOUNTER — Other Ambulatory Visit: Payer: Self-pay | Admitting: Hematology & Oncology

## 2017-11-22 MED FILL — DRONABINOL 5 MG CAP: 5 | 30 days supply | Qty: 60 | Fill #0

## 2017-11-22 MED FILL — LANTUS 100 UNITS/ML VIAL: 100 | 28 days supply | Qty: 10 | Fill #4

## 2017-11-25 MED FILL — GABAPENTIN 300 MG CAPS: 300 | 30 days supply | Qty: 180 | Fill #1

## 2017-12-05 ENCOUNTER — Other Ambulatory Visit: Payer: Self-pay

## 2017-12-05 ENCOUNTER — Ambulatory Visit (HOSPITAL_BASED_OUTPATIENT_CLINIC_OR_DEPARTMENT_OTHER): Payer: 59 | Admitting: Hematology & Oncology

## 2017-12-05 ENCOUNTER — Encounter: Payer: Self-pay | Admitting: Hematology & Oncology

## 2017-12-05 ENCOUNTER — Other Ambulatory Visit (HOSPITAL_BASED_OUTPATIENT_CLINIC_OR_DEPARTMENT_OTHER): Payer: 59

## 2017-12-05 ENCOUNTER — Ambulatory Visit (HOSPITAL_BASED_OUTPATIENT_CLINIC_OR_DEPARTMENT_OTHER): Payer: 59

## 2017-12-05 VITALS — BP 112/82 | HR 86 | Resp 17

## 2017-12-05 DIAGNOSIS — D500 Iron deficiency anemia secondary to blood loss (chronic): Secondary | ICD-10-CM

## 2017-12-05 DIAGNOSIS — C7B8 Other secondary neuroendocrine tumors: Secondary | ICD-10-CM

## 2017-12-05 DIAGNOSIS — C7A8 Other malignant neuroendocrine tumors: Secondary | ICD-10-CM

## 2017-12-05 DIAGNOSIS — Q858 Other phakomatoses, not elsewhere classified: Secondary | ICD-10-CM | POA: Diagnosis not present

## 2017-12-05 LAB — CBC WITH DIFFERENTIAL (CANCER CENTER ONLY)
BASO#: 0 10*3/uL (ref 0.0–0.2)
BASO%: 0.6 % (ref 0.0–2.0)
EOS%: 1.2 % (ref 0.0–7.0)
Eosinophils Absolute: 0.1 10*3/uL (ref 0.0–0.5)
HCT: 43.1 % (ref 38.7–49.9)
HGB: 13.8 g/dL (ref 13.0–17.1)
LYMPH#: 1 10*3/uL (ref 0.9–3.3)
LYMPH%: 19 % (ref 14.0–48.0)
MCH: 28.3 pg (ref 28.0–33.4)
MCHC: 32 g/dL (ref 32.0–35.9)
MCV: 89 fL (ref 82–98)
MONO#: 0.4 10*3/uL (ref 0.1–0.9)
MONO%: 8.3 % (ref 0.0–13.0)
NEUT#: 3.7 10*3/uL (ref 1.5–6.5)
NEUT%: 70.9 % (ref 40.0–80.0)
PLATELETS: 216 10*3/uL (ref 145–400)
RBC: 4.87 10*6/uL (ref 4.20–5.70)
RDW: 13.3 % (ref 11.1–15.7)
WBC: 5.2 10*3/uL (ref 4.0–10.0)

## 2017-12-05 LAB — CMP (CANCER CENTER ONLY)
ALK PHOS: 139 U/L — AB (ref 26–84)
ALT: 19 U/L (ref 10–47)
AST: 21 U/L (ref 11–38)
Albumin: 2.8 g/dL — ABNORMAL LOW (ref 3.3–5.5)
BUN, Bld: 16 mg/dL (ref 7–22)
CO2: 29 mEq/L (ref 18–33)
Calcium: 8.6 mg/dL (ref 8.0–10.3)
Chloride: 101 mEq/L (ref 98–108)
Creat: 0.8 mg/dl (ref 0.6–1.2)
GLUCOSE: 251 mg/dL — AB (ref 73–118)
POTASSIUM: 3.4 meq/L (ref 3.3–4.7)
Sodium: 137 mEq/L (ref 128–145)
TOTAL PROTEIN: 7.1 g/dL (ref 6.4–8.1)
Total Bilirubin: 0.9 mg/dl (ref 0.20–1.60)

## 2017-12-05 LAB — FERRITIN: Ferritin: 94 ng/ml (ref 22–316)

## 2017-12-05 LAB — IRON AND TIBC
%SAT: 33 % (ref 20–55)
Iron: 81 ug/dL (ref 42–163)
TIBC: 248 ug/dL (ref 202–409)
UIBC: 167 ug/dL (ref 117–376)

## 2017-12-05 LAB — LACTATE DEHYDROGENASE: LDH: 153 U/L (ref 125–245)

## 2017-12-05 MED ORDER — LANREOTIDE ACETATE 120 MG/0.5ML ~~LOC~~ SOLN
SUBCUTANEOUS | Status: AC
  Filled 2017-12-05: qty 120

## 2017-12-05 MED ORDER — LANREOTIDE ACETATE 120 MG/0.5ML ~~LOC~~ SOLN
120.0000 mg | Freq: Once | SUBCUTANEOUS | Status: AC
  Administered 2017-12-05: 120 mg via SUBCUTANEOUS
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
## 2017-12-07 LAB — CHROMOGRANIN A: Chromogranin A: 4 nmol/L (ref 0–5)

## 2017-12-07 MED FILL — BACLOFEN 20 MG TABLET: 20 | 15 days supply | Qty: 120 | Fill #3

## 2017-12-12 ENCOUNTER — Other Ambulatory Visit: Payer: Self-pay | Admitting: Hematology & Oncology

## 2017-12-12 DIAGNOSIS — Q858 Other phakomatoses, not elsewhere classified: Secondary | ICD-10-CM

## 2017-12-12 DIAGNOSIS — N319 Neuromuscular dysfunction of bladder, unspecified: Secondary | ICD-10-CM

## 2017-12-12 DIAGNOSIS — D500 Iron deficiency anemia secondary to blood loss (chronic): Secondary | ICD-10-CM

## 2017-12-12 DIAGNOSIS — C7A8 Other malignant neuroendocrine tumors: Secondary | ICD-10-CM

## 2017-12-12 DIAGNOSIS — Q8583 Von Hippel-Lindau syndrome: Secondary | ICD-10-CM

## 2017-12-12 MED FILL — traMADol HCL 50 MG TABS: 50 | 30 days supply | Qty: 90 | Fill #0

## 2017-12-13 MED FILL — FREESTYLE LITE TEST STRIP: 50 days supply | Qty: 200 | Fill #4

## 2017-12-13 MED FILL — CREON DR 24,000 UNITS CAP: 24000-76000 | 30 days supply | Qty: 270 | Fill #2

## 2017-12-14 ENCOUNTER — Encounter: Payer: Self-pay | Admitting: Hematology & Oncology

## 2017-12-14 DIAGNOSIS — H40021 Open angle with borderline findings, high risk, right eye: Secondary | ICD-10-CM | POA: Diagnosis not present

## 2017-12-14 DIAGNOSIS — Q858 Other phakomatoses, not elsewhere classified: Secondary | ICD-10-CM | POA: Diagnosis not present

## 2017-12-14 DIAGNOSIS — D1809 Hemangioma of other sites: Secondary | ICD-10-CM | POA: Diagnosis not present

## 2017-12-19 ENCOUNTER — Other Ambulatory Visit: Payer: Self-pay | Admitting: Hematology & Oncology

## 2017-12-19 DIAGNOSIS — C7A8 Other malignant neuroendocrine tumors: Secondary | ICD-10-CM

## 2017-12-19 DIAGNOSIS — K909 Intestinal malabsorption, unspecified: Secondary | ICD-10-CM

## 2017-12-19 DIAGNOSIS — R197 Diarrhea, unspecified: Principal | ICD-10-CM

## 2017-12-19 MED FILL — DIPHENOXYLATE-ATROP 2.5-0.0: 2.5-0.025 | 25 days supply | Qty: 100 | Fill #0

## 2017-12-26 MED FILL — GABAPENTIN 300 MG CAPS: 300 | 30 days supply | Qty: 180 | Fill #2

## 2017-12-26 MED FILL — HUMALOG 100 UNITS/ML KWIKPE: 100 | 60 days supply | Qty: 15 | Fill #2

## 2017-12-26 MED FILL — DRONABINOL 5 MG CAP: 5 | 30 days supply | Qty: 60 | Fill #1

## 2018-01-02 ENCOUNTER — Other Ambulatory Visit: Payer: 59

## 2018-01-02 ENCOUNTER — Ambulatory Visit: Payer: 59 | Admitting: Hematology & Oncology

## 2018-01-02 ENCOUNTER — Ambulatory Visit: Payer: 59

## 2018-01-09 ENCOUNTER — Inpatient Hospital Stay: Payer: 59

## 2018-01-09 ENCOUNTER — Inpatient Hospital Stay: Payer: 59 | Attending: Hematology & Oncology

## 2018-01-09 ENCOUNTER — Inpatient Hospital Stay (HOSPITAL_BASED_OUTPATIENT_CLINIC_OR_DEPARTMENT_OTHER): Payer: 59 | Admitting: Hematology & Oncology

## 2018-01-09 ENCOUNTER — Other Ambulatory Visit: Payer: Self-pay

## 2018-01-09 VITALS — BP 82/54 | HR 94 | Temp 97.5°F | Resp 18

## 2018-01-09 DIAGNOSIS — Q858 Other phakomatoses, not elsewhere classified: Secondary | ICD-10-CM | POA: Diagnosis not present

## 2018-01-09 DIAGNOSIS — D500 Iron deficiency anemia secondary to blood loss (chronic): Secondary | ICD-10-CM

## 2018-01-09 DIAGNOSIS — K922 Gastrointestinal hemorrhage, unspecified: Secondary | ICD-10-CM | POA: Insufficient documentation

## 2018-01-09 DIAGNOSIS — Z79899 Other long term (current) drug therapy: Secondary | ICD-10-CM | POA: Diagnosis not present

## 2018-01-09 DIAGNOSIS — Z794 Long term (current) use of insulin: Secondary | ICD-10-CM | POA: Insufficient documentation

## 2018-01-09 DIAGNOSIS — C7A8 Other malignant neuroendocrine tumors: Secondary | ICD-10-CM | POA: Diagnosis not present

## 2018-01-09 LAB — FERRITIN: Ferritin: 110 ng/mL (ref 22–316)

## 2018-01-09 LAB — CBC WITH DIFFERENTIAL (CANCER CENTER ONLY)
Basophils Absolute: 0 10*3/uL (ref 0.0–0.1)
Basophils Relative: 0 %
Eosinophils Absolute: 0.2 10*3/uL (ref 0.0–0.5)
Eosinophils Relative: 1 %
HEMATOCRIT: 43.8 % (ref 38.7–49.9)
HEMOGLOBIN: 14.1 g/dL (ref 13.0–17.1)
LYMPHS ABS: 1.1 10*3/uL (ref 0.9–3.3)
Lymphocytes Relative: 9 %
MCH: 27.8 pg — ABNORMAL LOW (ref 28.0–33.4)
MCHC: 32.2 g/dL (ref 32.0–35.9)
MCV: 86.4 fL (ref 82.0–98.0)
MONOS PCT: 8 %
Monocytes Absolute: 0.8 10*3/uL (ref 0.1–0.9)
NEUTROS ABS: 9.2 10*3/uL — AB (ref 1.5–6.5)
NEUTROS PCT: 82 %
Platelet Count: 288 10*3/uL (ref 145–400)
RBC: 5.07 MIL/uL (ref 4.20–5.70)
RDW: 12.7 % (ref 11.1–15.7)
WBC Count: 11.3 10*3/uL — ABNORMAL HIGH (ref 4.0–10.0)

## 2018-01-09 LAB — IRON AND TIBC
Iron: 53 ug/dL (ref 42–163)
SATURATION RATIOS: 23 % — AB (ref 42–163)
TIBC: 225 ug/dL (ref 202–409)
UIBC: 172 ug/dL

## 2018-01-09 LAB — CMP (CANCER CENTER ONLY)
ALK PHOS: 101 U/L — AB (ref 26–84)
ALT: 12 U/L (ref 0–55)
ANION GAP: 3 — AB (ref 5–15)
AST: 20 U/L (ref 5–34)
Albumin: 2.9 g/dL — ABNORMAL LOW (ref 3.5–5.0)
BUN: 14 mg/dL (ref 7–22)
CALCIUM: 8.8 mg/dL (ref 8.0–10.3)
CO2: 30 mmol/L (ref 18–33)
Chloride: 104 mmol/L (ref 98–108)
Creatinine: 0.4 mg/dL — ABNORMAL LOW (ref 0.70–1.30)
GLUCOSE: 161 mg/dL — AB (ref 73–118)
Potassium: 3.3 mmol/L (ref 3.3–4.7)
Sodium: 137 mmol/L (ref 128–145)
TOTAL PROTEIN: 7.1 g/dL (ref 6.4–8.1)
Total Bilirubin: 0.8 mg/dL (ref 0.2–1.2)

## 2018-01-09 LAB — LACTATE DEHYDROGENASE: LDH: 135 U/L (ref 125–245)

## 2018-01-09 MED ORDER — LANREOTIDE ACETATE 120 MG/0.5ML ~~LOC~~ SOLN
SUBCUTANEOUS | Status: AC
  Filled 2018-01-09: qty 120

## 2018-01-09 MED ORDER — LANREOTIDE ACETATE 120 MG/0.5ML ~~LOC~~ SOLN
120.0000 mg | Freq: Once | SUBCUTANEOUS | Status: AC
  Administered 2018-01-09: 120 mg via SUBCUTANEOUS
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
## 2018-01-09 NOTE — Patient Instructions (Signed)
Lanreotide injection (Somatuline) What is this medicine? LANREOTIDE (lan REE oh tide) is used to reduce blood levels of growth hormone in patients with a condition called acromegaly. It also works to slow or stop tumor growth in patients with neuroendocrine tumors and treat carcinoid syndrome. This medicine may be used for other purposes; ask your health care provider or pharmacist if you have questions. COMMON BRAND NAME(S): Somatuline [place] What should I tell my health care provider before I take this medicine? They need to know if you have any of these conditions: -diabetes -gallbladder disease -heart disease -kidney disease -liver disease -thyroid disease -an unusual or allergic reaction to lanreotide, other medicines, foods, dyes, or preservatives -pregnant or trying to get pregnant -breast-feeding How should I use this medicine? This medicine is for injection under the skin. It is given by a health care professional in a hospital or clinic setting. Contact your pediatrician or health care professional regarding the use of this medicine in children. Special care may be needed. Overdosage: If you think you have taken too much of this medicine contact a poison control center or emergency room at once. NOTE: This medicine is only for you. Do not share this medicine with others. What if I miss a dose? It is important not to miss your dose. Call your doctor or health care professional if you are unable to keep an appointment. What may interact with this medicine? This medicine may interact with the following medications: -bromocriptine -cyclosporine -certain medicines for blood pressure, heart disease, irregular heart beat -certain medicines for diabetes -quinidine -terfenadine This list may not describe all possible interactions. Give your health care provider a list of all the medicines, herbs, non-prescription drugs, or dietary supplements you use. Also tell them if you smoke, drink  alcohol, or use illegal drugs. Some items may interact with your medicine. What should I watch for while using this medicine? Tell your doctor or healthcare professional if your symptoms do not start to get better or if they get worse. Visit your doctor or health care professional for regular checks on your progress. Your condition will be monitored carefully while you are receiving this medicine. You may need blood work done while you are taking this medicine. Women should inform their doctor if they wish to become pregnant or think they might be pregnant. There is a potential for serious side effects to an unborn child. Talk to your health care professional or pharmacist for more information. Do not breast-feed an infant while taking this medicine or for 6 months after stopping it. This medicine has caused ovarian failure in some women. This medicine may interfere with the ability to have a child. Talk with your doctor or health care professional if you are concerned about your fertility. What side effects may I notice from receiving this medicine? Side effects that you should report to your doctor or health care professional as soon as possible: -allergic reactions like skin rash, itching or hives, swelling of the face, lips, or tongue -increased blood pressure -severe stomach pain -signs and symptoms of high blood sugar such as dizziness; dry mouth; dry skin; fruity breath; nausea; stomach pain; increased hunger or thirst; increased urination -signs and symptoms of low blood sugar such as feeling anxious; confusion; dizziness; increased hunger; unusually weak or tired; sweating; shakiness; cold; irritable; headache; blurred vision; fast heartbeat; loss of consciousness -unusually slow heartbeat Side effects that usually do not require medical attention (report to your doctor or health care professional if they   continue or are bothersome): -constipation -diarrhea -dizziness -headache -muscle  pain -muscle spasms -nausea -pain, redness, or irritation at site where injected This list may not describe all possible side effects. Call your doctor for medical advice about side effects. You may report side effects to FDA at 1-800-FDA-1088. Where should I keep my medicine? This drug is given in a hospital or clinic and will not be stored at home. NOTE: This sheet is a summary. It may not cover all possible information. If you have questions about this medicine, talk to your doctor, pharmacist, or health care provider.  2018 Elsevier/Gold Standard (2016-08-27 10:33:47)  

## 2018-01-10 ENCOUNTER — Encounter: Payer: Self-pay | Admitting: Hematology & Oncology

## 2018-01-10 ENCOUNTER — Telehealth: Payer: Self-pay | Admitting: *Deleted

## 2018-01-10 NOTE — Telephone Encounter (Addendum)
Patient is aware of results. Appointment scheduled  ----- Message from Volanda Napoleon, MD sent at 01/09/2018  4:11 PM EST ----- Call - iron is a little low!!  Please set up 1 dose of iron next week.  pete

## 2018-01-11 ENCOUNTER — Other Ambulatory Visit: Payer: Self-pay | Admitting: Hematology & Oncology

## 2018-01-11 DIAGNOSIS — C7A8 Other malignant neuroendocrine tumors: Secondary | ICD-10-CM

## 2018-01-11 DIAGNOSIS — D500 Iron deficiency anemia secondary to blood loss (chronic): Secondary | ICD-10-CM

## 2018-01-11 LAB — CHROMOGRANIN A: Chromogranin A: 2 nmol/L (ref 0–5)

## 2018-01-11 MED FILL — BACLOFEN 20 MG TABLET: 20 | 15 days supply | Qty: 120 | Fill #0

## 2018-01-11 MED FILL — traMADol HCL 50 MG TABS: 50 | 30 days supply | Qty: 90 | Fill #1

## 2018-01-11 MED FILL — LANTUS 100 UNITS/ML VIAL: 100 | 28 days supply | Qty: 10 | Fill #5

## 2018-01-11 MED FILL — CREON DR 24,000 UNITS CAP: 24000-76000 | 30 days supply | Qty: 270 | Fill #3

## 2018-01-11 MED FILL — OXYBUTYNIN 5 MG TABLET: 5 | 90 days supply | Qty: 180 | Fill #3

## 2018-01-12 ENCOUNTER — Inpatient Hospital Stay: Payer: 59

## 2018-01-12 VITALS — BP 117/75 | HR 57 | Resp 18

## 2018-01-12 DIAGNOSIS — Q858 Other phakomatoses, not elsewhere classified: Secondary | ICD-10-CM | POA: Diagnosis not present

## 2018-01-12 DIAGNOSIS — D500 Iron deficiency anemia secondary to blood loss (chronic): Secondary | ICD-10-CM | POA: Diagnosis not present

## 2018-01-12 DIAGNOSIS — K922 Gastrointestinal hemorrhage, unspecified: Secondary | ICD-10-CM | POA: Diagnosis not present

## 2018-01-12 DIAGNOSIS — C7A8 Other malignant neuroendocrine tumors: Secondary | ICD-10-CM | POA: Diagnosis not present

## 2018-01-12 DIAGNOSIS — Z794 Long term (current) use of insulin: Secondary | ICD-10-CM | POA: Diagnosis not present

## 2018-01-12 DIAGNOSIS — Z79899 Other long term (current) drug therapy: Secondary | ICD-10-CM | POA: Diagnosis not present

## 2018-01-12 MED ORDER — FERUMOXYTOL INJECTION 510 MG/17 ML
510.0000 mg | Freq: Once | INTRAVENOUS | Status: AC
  Administered 2018-01-12: 510 mg via INTRAVENOUS
  Filled 2018-01-12: qty 17

## 2018-01-12 NOTE — Patient Instructions (Signed)

## 2018-01-24 MED FILL — GABAPENTIN 300 MG CAPS: 300 | 30 days supply | Qty: 180 | Fill #3

## 2018-01-24 MED FILL — DRONABINOL 5 MG CAP: 5 | 30 days supply | Qty: 60 | Fill #2

## 2018-02-06 ENCOUNTER — Other Ambulatory Visit: Payer: 59

## 2018-02-06 ENCOUNTER — Ambulatory Visit: Payer: 59

## 2018-02-06 ENCOUNTER — Ambulatory Visit: Payer: 59 | Admitting: Hematology & Oncology

## 2018-02-07 ENCOUNTER — Inpatient Hospital Stay (HOSPITAL_BASED_OUTPATIENT_CLINIC_OR_DEPARTMENT_OTHER): Payer: 59 | Admitting: Hematology & Oncology

## 2018-02-07 ENCOUNTER — Other Ambulatory Visit: Payer: Self-pay | Admitting: Hematology & Oncology

## 2018-02-07 ENCOUNTER — Inpatient Hospital Stay: Payer: 59

## 2018-02-07 ENCOUNTER — Other Ambulatory Visit: Payer: Self-pay

## 2018-02-07 ENCOUNTER — Inpatient Hospital Stay: Payer: 59 | Attending: Hematology & Oncology

## 2018-02-07 VITALS — BP 125/82 | HR 73 | Temp 97.3°F | Resp 18

## 2018-02-07 DIAGNOSIS — Z79899 Other long term (current) drug therapy: Secondary | ICD-10-CM | POA: Insufficient documentation

## 2018-02-07 DIAGNOSIS — C7A8 Other malignant neuroendocrine tumors: Secondary | ICD-10-CM

## 2018-02-07 DIAGNOSIS — G8929 Other chronic pain: Secondary | ICD-10-CM | POA: Diagnosis not present

## 2018-02-07 DIAGNOSIS — D500 Iron deficiency anemia secondary to blood loss (chronic): Secondary | ICD-10-CM | POA: Diagnosis not present

## 2018-02-07 DIAGNOSIS — Q858 Other phakomatoses, not elsewhere classified: Secondary | ICD-10-CM

## 2018-02-07 DIAGNOSIS — K922 Gastrointestinal hemorrhage, unspecified: Secondary | ICD-10-CM | POA: Diagnosis not present

## 2018-02-07 DIAGNOSIS — G8220 Paraplegia, unspecified: Secondary | ICD-10-CM | POA: Insufficient documentation

## 2018-02-07 DIAGNOSIS — R197 Diarrhea, unspecified: Principal | ICD-10-CM

## 2018-02-07 DIAGNOSIS — K909 Intestinal malabsorption, unspecified: Secondary | ICD-10-CM

## 2018-02-07 LAB — CMP (CANCER CENTER ONLY)
ALK PHOS: 123 U/L — AB (ref 26–84)
ALT: 18 U/L (ref 10–47)
ANION GAP: 7 (ref 5–15)
AST: 26 U/L (ref 11–38)
Albumin: 3.1 g/dL — ABNORMAL LOW (ref 3.5–5.0)
BUN: 11 mg/dL (ref 7–22)
CHLORIDE: 102 mmol/L (ref 98–108)
CO2: 29 mmol/L (ref 18–33)
Calcium: 8.9 mg/dL (ref 8.0–10.3)
Creatinine: 0.7 mg/dL (ref 0.60–1.20)
Glucose, Bld: 287 mg/dL — ABNORMAL HIGH (ref 73–118)
POTASSIUM: 3.9 mmol/L (ref 3.3–4.7)
SODIUM: 138 mmol/L (ref 128–145)
TOTAL PROTEIN: 7.1 g/dL (ref 6.4–8.1)
Total Bilirubin: 0.8 mg/dL (ref 0.2–1.6)

## 2018-02-07 LAB — CBC WITH DIFFERENTIAL (CANCER CENTER ONLY)
BASOS ABS: 0 10*3/uL (ref 0.0–0.1)
Basophils Relative: 0 %
EOS PCT: 0 %
Eosinophils Absolute: 0.1 10*3/uL (ref 0.0–0.5)
HEMATOCRIT: 45.5 % (ref 38.7–49.9)
HEMOGLOBIN: 14.8 g/dL (ref 13.0–17.1)
LYMPHS ABS: 1.2 10*3/uL (ref 0.9–3.3)
LYMPHS PCT: 9 %
MCH: 28.4 pg (ref 28.0–33.4)
MCHC: 32.5 g/dL (ref 32.0–35.9)
MCV: 87.2 fL (ref 82.0–98.0)
Monocytes Absolute: 0.6 10*3/uL (ref 0.1–0.9)
Monocytes Relative: 5 %
NEUTROS ABS: 11.1 10*3/uL — AB (ref 1.5–6.5)
NEUTROS PCT: 86 %
Platelet Count: 213 10*3/uL (ref 145–400)
RBC: 5.22 MIL/uL (ref 4.20–5.70)
RDW: 13.6 % (ref 11.1–15.7)
WBC: 13 10*3/uL — AB (ref 4.0–10.0)

## 2018-02-07 MED ORDER — FENTANYL 12 MCG/HR TD PT72: 12.5000 ug | MEDICATED_PATCH | TRANSDERMAL | 0 refills | Status: DC

## 2018-02-07 MED ORDER — LANREOTIDE ACETATE 120 MG/0.5ML ~~LOC~~ SOLN
120.0000 mg | Freq: Once | SUBCUTANEOUS | Status: AC
  Administered 2018-02-07: 120 mg via SUBCUTANEOUS

## 2018-02-07 MED ORDER — SOLIFENACIN SUCCINATE 5 MG PO TABS: 5.0000 mg | ORAL_TABLET | Freq: Every day | ORAL | 5 refills | Status: DC

## 2018-02-07 MED FILL — traMADol HCL 50 MG TABS: 50 | 30 days supply | Qty: 90 | Fill #2

## 2018-02-07 MED FILL — DIPHENOXYLATE-ATROPINE TAB: 2.5-0.025 | 25 days supply | Qty: 100 | Fill #0

## 2018-02-07 MED FILL — BACLOFEN 20 MG TABLET: 20 | 15 days supply | Qty: 120 | Fill #1

## 2018-02-07 MED FILL — LANTUS 100 UNITS/ML VIAL: 100 | 28 days supply | Qty: 10 | Fill #0

## 2018-02-07 MED FILL — CREON DR 24,000 UNITS CAP: 24000-76000 | 30 days supply | Qty: 270 | Fill #0

## 2018-02-07 NOTE — Patient Instructions (Signed)
Lanreotide injection What is this medicine? LANREOTIDE (lan REE oh tide) is used to reduce blood levels of growth hormone in patients with a condition called acromegaly. It also works to slow or stop tumor growth in patients with neuroendocrine tumors and treat carcinoid syndrome. This medicine may be used for other purposes; ask your health care provider or pharmacist if you have questions. COMMON BRAND NAME(S): Somatuline [place] What should I tell my health care provider before I take this medicine? They need to know if you have any of these conditions: -diabetes -gallbladder disease -heart disease -kidney disease -liver disease -thyroid disease -an unusual or allergic reaction to lanreotide, other medicines, foods, dyes, or preservatives -pregnant or trying to get pregnant -breast-feeding How should I use this medicine? This medicine is for injection under the skin. It is given by a health care professional in a hospital or clinic setting. Contact your pediatrician or health care professional regarding the use of this medicine in children. Special care may be needed. Overdosage: If you think you have taken too much of this medicine contact a poison control center or emergency room at once. NOTE: This medicine is only for you. Do not share this medicine with others. What if I miss a dose? It is important not to miss your dose. Call your doctor or health care professional if you are unable to keep an appointment. What may interact with this medicine? This medicine may interact with the following medications: -bromocriptine -cyclosporine -certain medicines for blood pressure, heart disease, irregular heart beat -certain medicines for diabetes -quinidine -terfenadine This list may not describe all possible interactions. Give your health care provider a list of all the medicines, herbs, non-prescription drugs, or dietary supplements you use. Also tell them if you smoke, drink alcohol, or  use illegal drugs. Some items may interact with your medicine. What should I watch for while using this medicine? Tell your doctor or healthcare professional if your symptoms do not start to get better or if they get worse. Visit your doctor or health care professional for regular checks on your progress. Your condition will be monitored carefully while you are receiving this medicine. You may need blood work done while you are taking this medicine. Women should inform their doctor if they wish to become pregnant or think they might be pregnant. There is a potential for serious side effects to an unborn child. Talk to your health care professional or pharmacist for more information. Do not breast-feed an infant while taking this medicine or for 6 months after stopping it. This medicine has caused ovarian failure in some women. This medicine may interfere with the ability to have a child. Talk with your doctor or health care professional if you are concerned about your fertility. What side effects may I notice from receiving this medicine? Side effects that you should report to your doctor or health care professional as soon as possible: -allergic reactions like skin rash, itching or hives, swelling of the face, lips, or tongue -increased blood pressure -severe stomach pain -signs and symptoms of high blood sugar such as dizziness; dry mouth; dry skin; fruity breath; nausea; stomach pain; increased hunger or thirst; increased urination -signs and symptoms of low blood sugar such as feeling anxious; confusion; dizziness; increased hunger; unusually weak or tired; sweating; shakiness; cold; irritable; headache; blurred vision; fast heartbeat; loss of consciousness -unusually slow heartbeat Side effects that usually do not require medical attention (report to your doctor or health care professional if they continue   or are bothersome): -constipation -diarrhea -dizziness -headache -muscle pain -muscle  spasms -nausea -pain, redness, or irritation at site where injected This list may not describe all possible side effects. Call your doctor for medical advice about side effects. You may report side effects to FDA at 1-800-FDA-1088. Where should I keep my medicine? This drug is given in a hospital or clinic and will not be stored at home. NOTE: This sheet is a summary. It may not cover all possible information. If you have questions about this medicine, talk to your doctor, pharmacist, or health care provider.  2018 Elsevier/Gold Standard (2016-08-27 10:33:47)  

## 2018-02-07 NOTE — Progress Notes (Signed)
Hematology and Oncology Follow Up Visit  Nihar Klus [id number] 09-04-1961 57 y.o. 01/09/2018   Principle Diagnosis:   Metastatic neuroendocrine carcinoma  Von Hipple-Lindau Syndrome  Intermittent iron deficiency anemia secondary to GI bleeding.  Current Therapy:  Temodar/Xeloda - start 03/17/2016 - s/p c#3 - discontinued Somatuline 120mg  SQ q month IV iron as indicated-Feraheme given on 08/29/2017     Interim History:  Mr.  Datta is back for followup.  He is doing okay.  [facility].  He will have his MRI of the brain and spine while up there.  He did get IV iron last time he was here.  His iron studies showed a iron saturation of only 23%.  His ferritin was 110.  He responded very nicely to this.  He is had no fever.  His indwelling suprapubic catheter is doing well.  He has had some bladder spasms however.  We will try him on some Vesicare.  I will see about Duragesic patches for his chronic pain issues.  I think this would be reasonable.  I think it would be worthwhile trying.  His blood sugars are on the high side today.  They typically are.  There is been no bleeding.  He is had no leg swelling.  He is paraplegic.  The tingling in his hands are not all that bad.  This continues to improve.  The next time he comes in, we will have to get his scans.  His last chromogranin A level in late December was 4.  Overall, I said that his performance status is ECOG 1.     Medications:  Current Outpatient Medications:  .  ACCU-CHEK SMARTVIEW test strip, , Disp: , Rfl: 10 .  baclofen (LIORESAL) 10 mg/mL SUSP, , Disp: , Rfl:  .  baclofen (LIORESAL) 20 MG tablet, , Disp: , Rfl:  .  Balsam Peru-Castor Oil (VENELEX) OINT, Apply topically as needed, Disp: 60 g, Rfl: 4 .  capecitabine (XELODA) 500 MG tablet, Take by mouth., Disp: , Rfl:  .  chlorproMAZINE (THORAZINE) 10 MG tablet, Take by mouth., Disp: , Rfl:  .  diazepam (VALIUM) 5 MG tablet, TAKE 1  TABLET BY MOUTH 3 TIMES DAILY FOR SPASMS/ANXIETY, Disp: 90 tablet, Rfl: 1 .  diphenoxylate-atropine (LOMOTIL) 2.5-0.025 MG tablet, TAKE 1 TABLET BY MOUTH 4 TIMES DAILY AS NEEDED FOR DIARRHEA OR LOOSE STOOLS, Disp: 100 tablet, Rfl: 0 .  doxazosin (CARDURA) 1 MG tablet, Take 1 tablet (1 mg total) by mouth 2 (two) times daily. (Patient taking differently: Take 1 mg by mouth 2 (two) times daily as needed (blood pressure spikes.). ), Disp: 60 tablet, Rfl: 2 .  dronabinol (MARINOL) 5 MG capsule, TAKE 1 CAPSULE BY MOUTH TWICE DAILY WITH MEALS, Disp: 60 capsule, Rfl: 3 .  gabapentin (NEURONTIN) 300 MG capsule, TAKE 2 CAPSULES BY MOUTH 3 TIMES DAILY., Disp: 180 capsule, Rfl: 6 .  HYDROcodone-acetaminophen (NORCO/VICODIN) 5-325 MG tablet, Take 1-2 tablets by mouth every 6 (six) hours as needed for moderate pain., Disp: 90 tablet, Rfl: 0 .  HYDROmorphone (DILAUDID) 4 MG tablet, Take 1 tablet (4 mg total) by mouth every 6 (six) hours as needed for severe pain., Disp: 40 tablet, Rfl: 0 .  insulin glargine (LANTUS) 100 UNIT/ML injection, Inject 31 Units into the skin daily before breakfast. , Disp: , Rfl:  .  insulin lispro (HUMALOG) 100 UNIT/ML injection, Inject 3-9 Units into the skin 3 (three) times daily before meals. SS, Disp: ,  Rfl:  .  lipase/protease/amylase (CREON) 12000 units CPEP capsule, Take by mouth., Disp: , Rfl:  .  midodrine (PROAMATINE) 5 MG tablet, TKAE 1/2 TABLET BY MOUTH TWICE A DAY AS NEEDED, Disp: 60 tablet, Rfl: 6 .  NON FORMULARY, Take by mouth., Disp: , Rfl:  .  oxybutynin (DITROPAN) 5 MG tablet, 5 mg 2 (two) times daily. , Disp: , Rfl: 3 .  promethazine (PHENERGAN) 12.5 MG tablet, Take 12.5 mg by mouth every 6 (six) hours as needed for nausea. , Disp: , Rfl:  .  tiZANidine (ZANAFLEX) 4 MG tablet, TAKE 2 TABLETS BY MOUTH EVERY 6 HOURS AS NEEDED FOR PAIN, Disp: 30 tablet, Rfl: 3 .  traMADol (ULTRAM) 50 MG tablet, TAKE 1 TABLET BY MOUTH 3 TIMES DAILY, Disp: 90 tablet, Rfl: 2  Allergies:    Allergies  Allergen Reactions  . Citalopram Diarrhea  . Ciprocin-Fluocin-Procin [Fluocinolone Acetonide] Rash    Pt states this happened with IV Cipro. ABLE TO TAKE PO CIPRO.  . Other Rash    IV-CIPRO    Past Medical History, Surgical history, Social history, and Family History were reviewed and updated.  Review of Systems: Review of Systems  Constitutional: Negative.   HENT: Negative.   Eyes: Negative.   Respiratory: Negative.   Cardiovascular: Negative.   Gastrointestinal: Positive for diarrhea and nausea.  Genitourinary: Positive for frequency.  Musculoskeletal: Positive for myalgias and neck pain.  Skin: Negative.   Neurological: Negative.   Endo/Heme/Allergies: Negative.   Psychiatric/Behavioral: Negative.      Physical Exam:  oral temperature is 97.5 F (36.4 C) (abnormal). His blood pressure is 82/54 (abnormal) and his pulse is 94. His respiration is 18 and oxygen saturation is 99%.   Physical Exam  Constitutional: He is oriented to person, place, and time.  HENT:  Head: Normocephalic and atraumatic.  Mouth/Throat: Oropharynx is clear and moist.  Eyes: EOM are normal. Pupils are equal, round, and reactive to light.  Neck: Normal range of motion.  Cardiovascular: Normal rate, regular rhythm and normal heart sounds.  Pulmonary/Chest: Effort normal and breath sounds normal.  Abdominal: Soft. Bowel sounds are normal.  He has a suprapubic catheter in place  Musculoskeletal: Normal range of motion. He exhibits no edema, tenderness or deformity.  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  Neurological: He is alert and oriented to person, place, and time.  Skin: Skin is warm and dry. No rash noted. No erythema.  Psychiatric: He has a normal mood and affect. His behavior is normal. Judgment and thought content normal.  Vitals reviewed. .  Lab Results  Component Value Date   WBC 11.3 (H) 01/09/2018   HGB 13.8  12/05/2017   HCT 43.8 01/09/2018   MCV 86.4 01/09/2018   PLT 288 01/09/2018     Chemistry      Component Value Date/Time   NA 137 01/09/2018 1118   NA 137 12/05/2017 0840   NA 138 10/12/2016 0955   K 3.3 01/09/2018 1118   K 3.4 12/05/2017 0840   K 3.3 (L) 10/12/2016 0955   CL 104 01/09/2018 1118   CL 101 12/05/2017 0840   CO2 30 01/09/2018 1118   CO2 29 12/05/2017 0840   CO2 25 10/12/2016 0955   BUN 14 01/09/2018 1118   BUN 16 12/05/2017 0840   BUN 22.0 10/12/2016 0955   CREATININE 0.8 12/05/2017 0840   CREATININE 0.8 10/12/2016 0955  Component Value Date/Time   CALCIUM 8.8 01/09/2018 1118   CALCIUM 8.6 12/05/2017 0840   CALCIUM 9.0 10/12/2016 0955   ALKPHOS 101 (H) 01/09/2018 1118   ALKPHOS 139 (H) 12/05/2017 0840   ALKPHOS 159 (H) 10/12/2016 0955   AST 20 01/09/2018 1118   AST 45 (H) 10/12/2016 0955   ALT 12 01/09/2018 1118   ALT 19 12/05/2017 0840   ALT 29 10/12/2016 0955   BILITOT 0.8 01/09/2018 1118   BILITOT 0.62 10/12/2016 0955     Impression and Plan: Mr. Mcgilvery is 57 year old gentleman with von Hippel-Lindau syndrome. He has multiple neuroendocrine tumors. 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 He's had multiple spinal surgeries.  We will have to see what his doctor say up with the [facility].  We will plan to get him back in another month.  We will see him back, we will do his scans.   Volanda Napoleon, MD 2/4/20196:20 PM

## 2018-02-08 ENCOUNTER — Other Ambulatory Visit: Payer: Self-pay | Admitting: Family

## 2018-02-08 ENCOUNTER — Encounter: Payer: Self-pay | Admitting: *Deleted

## 2018-02-08 DIAGNOSIS — Q858 Other phakomatoses, not elsewhere classified: Secondary | ICD-10-CM | POA: Diagnosis not present

## 2018-02-08 DIAGNOSIS — D1809 Hemangioma of other sites: Secondary | ICD-10-CM | POA: Diagnosis not present

## 2018-02-08 LAB — IRON AND TIBC
IRON: 101 ug/dL (ref 42–163)
SATURATION RATIOS: 42 % (ref 42–163)
TIBC: 243 ug/dL (ref 202–409)
UIBC: 142 ug/dL

## 2018-02-08 LAB — FERRITIN: Ferritin: 226 ng/mL (ref 22–316)

## 2018-02-08 MED FILL — VESIcare 5 MG TABS: 5 | 30 days supply | Qty: 30 | Fill #0

## 2018-02-09 LAB — CHROMOGRANIN A: Chromogranin A: 4 nmol/L (ref 0–5)

## 2018-02-10 MED FILL — fentaNYL 12 MCG/HR PT72: 12 | 30 days supply | Qty: 10 | Fill #0

## 2018-02-20 MED FILL — FREESTYLE LITE TEST STRIP: 50 days supply | Qty: 200 | Fill #5

## 2018-02-24 MED FILL — DRONABINOL 5 MG CAP: 5 | 30 days supply | Qty: 60 | Fill #3

## 2018-02-27 MED FILL — GABAPENTIN 300 MG CAPS: 300 | 30 days supply | Qty: 180 | Fill #4

## 2018-03-06 ENCOUNTER — Ambulatory Visit: Payer: 59 | Admitting: Hematology & Oncology

## 2018-03-06 ENCOUNTER — Ambulatory Visit: Payer: 59

## 2018-03-06 ENCOUNTER — Other Ambulatory Visit: Payer: 59

## 2018-03-07 ENCOUNTER — Encounter (HOSPITAL_BASED_OUTPATIENT_CLINIC_OR_DEPARTMENT_OTHER): Payer: Self-pay

## 2018-03-07 ENCOUNTER — Inpatient Hospital Stay (HOSPITAL_BASED_OUTPATIENT_CLINIC_OR_DEPARTMENT_OTHER): Payer: 59 | Admitting: Hematology & Oncology

## 2018-03-07 ENCOUNTER — Other Ambulatory Visit: Payer: Self-pay | Admitting: Hematology & Oncology

## 2018-03-07 ENCOUNTER — Ambulatory Visit (HOSPITAL_BASED_OUTPATIENT_CLINIC_OR_DEPARTMENT_OTHER)
Admission: RE | Admit: 2018-03-07 | Discharge: 2018-03-07 | Disposition: A | Discharge status: Home / Self Care | Payer: 59 | Source: Ambulatory Visit | Attending: Hematology & Oncology | Admitting: Hematology & Oncology

## 2018-03-07 ENCOUNTER — Inpatient Hospital Stay: Payer: 59 | Attending: Hematology & Oncology

## 2018-03-07 ENCOUNTER — Encounter: Payer: Self-pay | Admitting: Hematology & Oncology

## 2018-03-07 ENCOUNTER — Inpatient Hospital Stay: Payer: 59

## 2018-03-07 ENCOUNTER — Other Ambulatory Visit: Payer: Self-pay

## 2018-03-07 VITALS — BP 114/66 | HR 83 | Temp 97.7°F | Resp 16

## 2018-03-07 DIAGNOSIS — Q858 Other phakomatoses, not elsewhere classified: Secondary | ICD-10-CM | POA: Insufficient documentation

## 2018-03-07 DIAGNOSIS — C787 Secondary malignant neoplasm of liver and intrahepatic bile duct: Secondary | ICD-10-CM | POA: Insufficient documentation

## 2018-03-07 DIAGNOSIS — D496 Neoplasm of unspecified behavior of brain: Secondary | ICD-10-CM | POA: Diagnosis not present

## 2018-03-07 DIAGNOSIS — D500 Iron deficiency anemia secondary to blood loss (chronic): Secondary | ICD-10-CM

## 2018-03-07 DIAGNOSIS — K922 Gastrointestinal hemorrhage, unspecified: Secondary | ICD-10-CM

## 2018-03-07 DIAGNOSIS — C7A8 Other malignant neuroendocrine tumors: Secondary | ICD-10-CM

## 2018-03-07 DIAGNOSIS — Z79899 Other long term (current) drug therapy: Secondary | ICD-10-CM | POA: Insufficient documentation

## 2018-03-07 DIAGNOSIS — G8220 Paraplegia, unspecified: Secondary | ICD-10-CM

## 2018-03-07 DIAGNOSIS — D49512 Neoplasm of unspecified behavior of left kidney: Secondary | ICD-10-CM | POA: Diagnosis not present

## 2018-03-07 DIAGNOSIS — Q8583 Von Hippel-Lindau syndrome: Secondary | ICD-10-CM

## 2018-03-07 DIAGNOSIS — N319 Neuromuscular dysfunction of bladder, unspecified: Secondary | ICD-10-CM

## 2018-03-07 DIAGNOSIS — K909 Intestinal malabsorption, unspecified: Secondary | ICD-10-CM

## 2018-03-07 DIAGNOSIS — C7A1 Malignant poorly differentiated neuroendocrine tumors: Secondary | ICD-10-CM | POA: Diagnosis not present

## 2018-03-07 DIAGNOSIS — R197 Diarrhea, unspecified: Principal | ICD-10-CM

## 2018-03-07 LAB — CBC WITH DIFFERENTIAL (CANCER CENTER ONLY)
BASOS ABS: 0 10*3/uL (ref 0.0–0.1)
Basophils Relative: 0 %
EOS PCT: 1 %
Eosinophils Absolute: 0.1 10*3/uL (ref 0.0–0.5)
HCT: 41.4 % (ref 38.7–49.9)
Hemoglobin: 13.5 g/dL (ref 13.0–17.1)
LYMPHS PCT: 11 %
Lymphs Abs: 0.9 10*3/uL (ref 0.9–3.3)
MCH: 28.4 pg (ref 28.0–33.4)
MCHC: 32.6 g/dL (ref 32.0–35.9)
MCV: 87 fL (ref 82.0–98.0)
MONO ABS: 0.8 10*3/uL (ref 0.1–0.9)
MONOS PCT: 10 %
Neutro Abs: 6.3 10*3/uL (ref 1.5–6.5)
Neutrophils Relative %: 78 %
Platelet Count: 187 10*3/uL (ref 145–400)
RBC: 4.76 MIL/uL (ref 4.20–5.70)
RDW: 13.7 % (ref 11.1–15.7)
WBC Count: 8.2 10*3/uL (ref 4.0–10.0)

## 2018-03-07 LAB — CMP (CANCER CENTER ONLY)
ALT: 9 U/L — ABNORMAL LOW (ref 10–47)
ANION GAP: 10 (ref 5–15)
AST: 25 U/L (ref 11–38)
Albumin: 3 g/dL — ABNORMAL LOW (ref 3.5–5.0)
Alkaline Phosphatase: 97 U/L — ABNORMAL HIGH (ref 26–84)
BILIRUBIN TOTAL: 0.7 mg/dL (ref 0.2–1.6)
BUN: 15 mg/dL (ref 7–22)
CHLORIDE: 100 mmol/L (ref 98–108)
CO2: 29 mmol/L (ref 18–33)
Calcium: 8.7 mg/dL (ref 8.0–10.3)
Creatinine: 0.7 mg/dL (ref 0.60–1.20)
Glucose, Bld: 223 mg/dL — ABNORMAL HIGH (ref 73–118)
POTASSIUM: 4.3 mmol/L (ref 3.3–4.7)
Sodium: 139 mmol/L (ref 128–145)
Total Protein: 6.8 g/dL (ref 6.4–8.1)

## 2018-03-07 MED ORDER — IOPAMIDOL (ISOVUE-300) INJECTION 61%
100.0000 mL | Freq: Once | INTRAVENOUS | Status: AC | PRN
  Administered 2018-03-07: 100 mL via INTRAVENOUS

## 2018-03-07 MED ORDER — LANREOTIDE ACETATE 120 MG/0.5ML ~~LOC~~ SOLN
120.0000 mg | Freq: Once | SUBCUTANEOUS | Status: AC
  Administered 2018-03-07: 120 mg via SUBCUTANEOUS

## 2018-03-07 MED ORDER — LANREOTIDE ACETATE 120 MG/0.5ML ~~LOC~~ SOLN
SUBCUTANEOUS | Status: AC
  Filled 2018-03-07: qty 120

## 2018-03-07 MED FILL — CREON DR 24,000 UNITS CAP: 24000-76000 | 30 days supply | Qty: 270 | Fill #1

## 2018-03-07 MED FILL — traMADol HCL 50 MG TABS: 50 | 30 days supply | Qty: 90 | Fill #0

## 2018-03-07 MED FILL — VESIcare 5 MG TABS: 5 | 30 days supply | Qty: 30 | Fill #1

## 2018-03-07 MED FILL — DIPHENOXYLATE-ATROPINE TAB: 2.5-0.025 | 25 days supply | Qty: 100 | Fill #0

## 2018-03-07 NOTE — Progress Notes (Signed)
Hematology and Oncology Follow Up Visit  [name] [id number] Feb 05, 1961 57 y.o. 01/09/2018   Principle Diagnosis:   Metastatic neuroendocrine carcinoma  Von Hipple-Lindau Syndrome  Intermittent iron deficiency anemia secondary to GI bleeding.  Current Therapy:  Temodar/Xeloda - start 03/17/2016 - s/p c#3 - discontinued Somatuline 120mg  SQ q month IV iron as indicated-Feraheme given on 08/29/2017     Interim History:  Mr.  Schoon is back for followup.  He did not get good news from the [place].  He was up there for routine follow-up.  It sounds like he has a brain tumor that is progressing.  From what his wife says, the brain tumor is about 3 mm bigger.  So far, he is asymptomatic with this.  Because he is asymptomatic, the neurosurgeons at the [facility] will hold off on any intervention.  The next issue that we have is that we did his routine scans today.  And surprisingly, the scans showed that he has progression of his neuroendocrine liver metastases and also progression of his renal cell carcinomas.  I am most concerned with what is in the left kidney.  There is significant growth in the left kidney with a lower pole tumor and an upper pole tumor.  The liver metastasis are also little bit larger.  Thankfully, I do not see anything that is new.  He is feeling fairly well.  He has had no abdominal pain.  He has had no bleeding.  He has had no fever.  There is been no bleeding.  He is had no leg swelling.   He is paraplegic.  The tingling in his hands are not all that bad.  This continues to improve.  His appetite is doing quite well.  He has a suprapubic catheter for his neurogenic bladder.  He does not think we have to do a urinalysis today.  Overall, I would say that his performance status is ECOG 1.   Medications:  Current Outpatient Medications:  .  ACCU-CHEK SMARTVIEW test strip, , Disp: , Rfl: 10 .  baclofen (LIORESAL) 10 mg/mL SUSP, , Disp: , Rfl:  .  baclofen  (LIORESAL) 20 MG tablet, , Disp: , Rfl:  .  Balsam [place]-Castor Oil (VENELEX) OINT, Apply topically as needed, Disp: 60 g, Rfl: 4 .  capecitabine (XELODA) 500 MG tablet, Take by mouth., Disp: , Rfl:  .  chlorproMAZINE (THORAZINE) 10 MG tablet, Take by mouth., Disp: , Rfl:  .  diazepam (VALIUM) 5 MG tablet, TAKE 1 TABLET BY MOUTH 3 TIMES DAILY FOR SPASMS/ANXIETY, Disp: 90 tablet, Rfl: 1 .  diphenoxylate-atropine (LOMOTIL) 2.5-0.025 MG tablet, TAKE 1 TABLET BY MOUTH 4 TIMES DAILY AS NEEDED FOR DIARRHEA OR LOOSE STOOLS, Disp: 100 tablet, Rfl: 0 .  doxazosin (CARDURA) 1 MG tablet, Take 1 tablet (1 mg total) by mouth 2 (two) times daily. (Patient taking differently: Take 1 mg by mouth 2 (two) times daily as needed (blood pressure spikes.). ), Disp: 60 tablet, Rfl: 2 .  dronabinol (MARINOL) 5 MG capsule, TAKE 1 CAPSULE BY MOUTH TWICE DAILY WITH MEALS, Disp: 60 capsule, Rfl: 3 .  gabapentin (NEURONTIN) 300 MG capsule, TAKE 2 CAPSULES BY MOUTH 3 TIMES DAILY., Disp: 180 capsule, Rfl: 6 .  HYDROcodone-acetaminophen (NORCO/VICODIN) 5-325 MG tablet, Take 1-2 tablets by mouth every 6 (six) hours as needed for moderate pain., Disp: 90 tablet, Rfl: 0 .  HYDROmorphone (DILAUDID) 4 MG tablet, Take 1 tablet (4 mg total) by mouth every 6 (six) hours as needed  for severe pain., Disp: 40 tablet, Rfl: 0 .  insulin glargine (LANTUS) 100 UNIT/ML injection, Inject 31 Units into the skin daily before breakfast. , Disp: , Rfl:  .  insulin lispro (HUMALOG) 100 UNIT/ML injection, Inject 3-9 Units into the skin 3 (three) times daily before meals. SS, Disp: , Rfl:  .  lipase/protease/amylase (CREON) 12000 units CPEP capsule, Take by mouth., Disp: , Rfl:  .  midodrine (PROAMATINE) 5 MG tablet, TKAE 1/2 TABLET BY MOUTH TWICE A DAY AS NEEDED, Disp: 60 tablet, Rfl: 6 .  NON FORMULARY, Take by mouth., Disp: , Rfl:  .  oxybutynin (DITROPAN) 5 MG tablet, 5 mg 2 (two) times daily. , Disp: , Rfl: 3 .  promethazine (PHENERGAN) 12.5 MG  tablet, Take 12.5 mg by mouth every 6 (six) hours as needed for nausea. , Disp: , Rfl:  .  tiZANidine (ZANAFLEX) 4 MG tablet, TAKE 2 TABLETS BY MOUTH EVERY 6 HOURS AS NEEDED FOR PAIN, Disp: 30 tablet, Rfl: 3 .  traMADol (ULTRAM) 50 MG tablet, TAKE 1 TABLET BY MOUTH 3 TIMES DAILY, Disp: 90 tablet, Rfl: 2  Allergies:  Allergies  Allergen Reactions  . Citalopram Diarrhea  . Ciprocin-Fluocin-Procin [Fluocinolone Acetonide] Rash    Pt states this happened with IV Cipro. ABLE TO TAKE PO CIPRO.  . Other Rash    IV-CIPRO    Past Medical History, Surgical history, Social history, and Family History were reviewed and updated.  Review of Systems: Review of Systems  Constitutional: Negative.   HENT: Negative.   Eyes: Negative.   Respiratory: Negative.   Cardiovascular: Negative.   Gastrointestinal: Positive for diarrhea and nausea.  Genitourinary: Positive for frequency.  Musculoskeletal: Positive for myalgias and neck pain.  Skin: Negative.   Neurological: Negative.   Endo/Heme/Allergies: Negative.   Psychiatric/Behavioral: Negative.      Physical Exam:  oral temperature is 97.5 F (36.4 C) (abnormal). His blood pressure is 82/54 (abnormal) and his pulse is 94. His respiration is 18 and oxygen saturation is 99%.   Physical Exam  Constitutional: He is oriented to person, place, and time.  HENT:  Head: Normocephalic and atraumatic.  Mouth/Throat: Oropharynx is clear and moist.  Eyes: Pupils are equal, round, and reactive to light. EOM are normal.  Neck: Normal range of motion.  Cardiovascular: Normal rate, regular rhythm and normal heart sounds.  Pulmonary/Chest: Effort normal and breath sounds normal.  Abdominal: Soft. Bowel sounds are normal.  He has a suprapubic catheter in place  Musculoskeletal: Normal range of motion. He exhibits no edema, tenderness or deformity.  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  Neurological: He  is alert and oriented to person, place, and time.  Skin: Skin is warm and dry. No rash noted. No erythema.  Psychiatric: He has a normal mood and affect. His behavior is normal. Judgment and thought content normal.  Vitals reviewed. .  Lab Results  Component Value Date   WBC 11.3 (H) 01/09/2018   HGB 13.8 12/05/2017   HCT 43.8 01/09/2018   MCV 86.4 01/09/2018   PLT 288 01/09/2018     Chemistry      Component Value Date/Time   NA 137 01/09/2018 1118   NA 137 12/05/2017 0840   NA 138 10/12/2016 0955   K 3.3 01/09/2018 1118   K 3.4 12/05/2017 0840   K 3.3 (L) 10/12/2016 0955   CL 104 01/09/2018 1118   CL 101 12/05/2017 0840  CO2 30 01/09/2018 1118   CO2 29 12/05/2017 0840   CO2 25 10/12/2016 0955   BUN 14 01/09/2018 1118   BUN 16 12/05/2017 0840   BUN 22.0 10/12/2016 0955   CREATININE 0.8 12/05/2017 0840   CREATININE 0.8 10/12/2016 0955      Component Value Date/Time   CALCIUM 8.8 01/09/2018 1118   CALCIUM 8.6 12/05/2017 0840   CALCIUM 9.0 10/12/2016 0955   ALKPHOS 101 (H) 01/09/2018 1118   ALKPHOS 139 (H) 12/05/2017 0840   ALKPHOS 159 (H) 10/12/2016 0955   AST 20 01/09/2018 1118   AST 45 (H) 10/12/2016 0955   ALT 12 01/09/2018 1118   ALT 19 12/05/2017 0840   ALT 29 10/12/2016 0955   BILITOT 0.8 01/09/2018 1118   BILITOT 0.62 10/12/2016 0955     Impression and Plan: Mr. Frasier is 57 year old gentleman with von Hippel-Lindau syndrome. He has multiple neuroendocrine tumors. 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 He's had multiple spinal surgeries.  Unfortunately, looks like we have multiple problems now.  I think we are going to have to try to prioritize what needs to be done.  I think that the progressive tumors in his kidneys are probably the biggest issue.  I will have to speak with urology about this.  I think that the kidney cancers, which are renal cell carcinomas are going to have to be removed.  As far as what is in the  liver, I will set him up with a nuclear medicine Dotatate PET scan.  I will see if this shows activity with the liver tumors.  If so, then we will refer him for Lutathera treatment.  As far as the brain is concerned, [facility] will deal with this at the proper [facility].  The brain tumors have not grown that much.  I am just surprised by the activity of his malignancies.  This is an incredibly complex situation.  I spent about 45 minutes with he and his wife.  Over half the time was spent face-to-face.  We will get him back in 1 month, as always so that we can come up with our game plan.   Volanda Napoleon, MD 2/4/20196:20 PM

## 2018-03-07 NOTE — Patient Instructions (Signed)
Lanreotide injection What is this medicine? LANREOTIDE (lan REE oh tide) is used to reduce blood levels of growth hormone in patients with a condition called acromegaly. It also works to slow or stop tumor growth in patients with neuroendocrine tumors and treat carcinoid syndrome. This medicine may be used for other purposes; ask your health care provider or pharmacist if you have questions. COMMON BRAND NAME(S): Somatuline [place] What should I tell my health care provider before I take this medicine? They need to know if you have any of these conditions: -diabetes -gallbladder disease -heart disease -kidney disease -liver disease -thyroid disease -an unusual or allergic reaction to lanreotide, other medicines, foods, dyes, or preservatives -pregnant or trying to get pregnant -breast-feeding How should I use this medicine? This medicine is for injection under the skin. It is given by a health care professional in a hospital or clinic setting. Contact your pediatrician or health care professional regarding the use of this medicine in children. Special care may be needed. Overdosage: If you think you have taken too much of this medicine contact a poison control center or emergency room at once. NOTE: This medicine is only for you. Do not share this medicine with others. What if I miss a dose? It is important not to miss your dose. Call your doctor or health care professional if you are unable to keep an appointment. What may interact with this medicine? This medicine may interact with the following medications: -bromocriptine -cyclosporine -certain medicines for blood pressure, heart disease, irregular heart beat -certain medicines for diabetes -quinidine -terfenadine This list may not describe all possible interactions. Give your health care provider a list of all the medicines, herbs, non-prescription drugs, or dietary supplements you use. Also tell them if you smoke, drink alcohol, or  use illegal drugs. Some items may interact with your medicine. What should I watch for while using this medicine? Tell your doctor or healthcare professional if your symptoms do not start to get better or if they get worse. Visit your doctor or health care professional for regular checks on your progress. Your condition will be monitored carefully while you are receiving this medicine. You may need blood work done while you are taking this medicine. Women should inform their doctor if they wish to become pregnant or think they might be pregnant. There is a potential for serious side effects to an unborn child. Talk to your health care professional or pharmacist for more information. Do not breast-feed an infant while taking this medicine or for 6 months after stopping it. This medicine has caused ovarian failure in some women. This medicine may interfere with the ability to have a child. Talk with your doctor or health care professional if you are concerned about your fertility. What side effects may I notice from receiving this medicine? Side effects that you should report to your doctor or health care professional as soon as possible: -allergic reactions like skin rash, itching or hives, swelling of the face, lips, or tongue -increased blood pressure -severe stomach pain -signs and symptoms of high blood sugar such as dizziness; dry mouth; dry skin; fruity breath; nausea; stomach pain; increased hunger or thirst; increased urination -signs and symptoms of low blood sugar such as feeling anxious; confusion; dizziness; increased hunger; unusually weak or tired; sweating; shakiness; cold; irritable; headache; blurred vision; fast heartbeat; loss of consciousness -unusually slow heartbeat Side effects that usually do not require medical attention (report to your doctor or health care professional if they continue   or are bothersome): -constipation -diarrhea -dizziness -headache -muscle pain -muscle  spasms -nausea -pain, redness, or irritation at site where injected This list may not describe all possible side effects. Call your doctor for medical advice about side effects. You may report side effects to FDA at 1-800-FDA-1088. Where should I keep my medicine? This drug is given in a hospital or clinic and will not be stored at home. NOTE: This sheet is a summary. It may not cover all possible information. If you have questions about this medicine, talk to your doctor, pharmacist, or health care provider.  2018 Elsevier/Gold Standard (2016-08-27 10:33:47)  

## 2018-03-08 LAB — IRON AND TIBC
IRON: 54 ug/dL (ref 42–163)
SATURATION RATIOS: 24 % — AB (ref 42–163)
TIBC: 223 ug/dL (ref 202–409)
UIBC: 169 ug/dL

## 2018-03-08 LAB — FERRITIN: FERRITIN: 183 ng/mL (ref 22–316)

## 2018-03-09 LAB — CHROMOGRANIN A: CHROMOGRANIN A: 4 nmol/L (ref 0–5)

## 2018-03-10 MED FILL — BACLOFEN 20 MG TABLET: 20 | 15 days supply | Qty: 120 | Fill #2

## 2018-03-11 ENCOUNTER — Encounter: Payer: Self-pay | Admitting: Hematology & Oncology

## 2018-03-13 ENCOUNTER — Encounter: Payer: Self-pay | Admitting: Hematology & Oncology

## 2018-03-15 ENCOUNTER — Encounter: Payer: Self-pay | Admitting: Hematology & Oncology

## 2018-03-23 ENCOUNTER — Encounter: Payer: Self-pay | Admitting: Hematology & Oncology

## 2018-03-24 ENCOUNTER — Other Ambulatory Visit: Payer: Self-pay | Admitting: Hematology & Oncology

## 2018-03-24 ENCOUNTER — Encounter: Payer: Self-pay | Admitting: Hematology & Oncology

## 2018-03-24 MED FILL — DRONABINOL 5 MG CAP: 5 | 30 days supply | Qty: 60 | Fill #0

## 2018-03-24 MED FILL — LANTUS 100 UNITS/ML VIAL: 100 | 28 days supply | Qty: 10 | Fill #1

## 2018-03-27 MED FILL — diazePAM 5 MG TABS: 5 | 30 days supply | Qty: 90 | Fill #1

## 2018-03-27 MED FILL — MIDODRINE HCL 5 MG TABLET: 5 | 60 days supply | Qty: 60 | Fill #3

## 2018-03-29 MED FILL — GABAPENTIN 300 MG CAPSULE: 300 | 30 days supply | Qty: 180 | Fill #5

## 2018-04-03 ENCOUNTER — Encounter (HOSPITAL_COMMUNITY)
Admission: RE | Admit: 2018-04-03 | Discharge: 2018-04-03 | Disposition: A | Discharge status: Home / Self Care | Payer: 59 | Source: Ambulatory Visit | Attending: Hematology & Oncology | Admitting: Hematology & Oncology

## 2018-04-03 DIAGNOSIS — C787 Secondary malignant neoplasm of liver and intrahepatic bile duct: Secondary | ICD-10-CM | POA: Diagnosis not present

## 2018-04-03 DIAGNOSIS — C7A8 Other malignant neuroendocrine tumors: Secondary | ICD-10-CM | POA: Insufficient documentation

## 2018-04-03 DIAGNOSIS — C7A1 Malignant poorly differentiated neuroendocrine tumors: Secondary | ICD-10-CM | POA: Diagnosis not present

## 2018-04-03 DIAGNOSIS — C801 Malignant (primary) neoplasm, unspecified: Secondary | ICD-10-CM | POA: Diagnosis not present

## 2018-04-03 MED ORDER — GALLIUM GA 68 DOTATATE IV KIT
5.1000 | PACK | Freq: Once | INTRAVENOUS | Status: AC
  Administered 2018-04-03: 5.1 via INTRAVENOUS

## 2018-04-04 ENCOUNTER — Inpatient Hospital Stay: Payer: 59

## 2018-04-04 ENCOUNTER — Inpatient Hospital Stay (HOSPITAL_BASED_OUTPATIENT_CLINIC_OR_DEPARTMENT_OTHER): Payer: 59 | Admitting: Hematology & Oncology

## 2018-04-04 VITALS — BP 123/92 | HR 80 | Temp 97.9°F | Resp 16

## 2018-04-04 DIAGNOSIS — K922 Gastrointestinal hemorrhage, unspecified: Secondary | ICD-10-CM

## 2018-04-04 DIAGNOSIS — C787 Secondary malignant neoplasm of liver and intrahepatic bile duct: Secondary | ICD-10-CM

## 2018-04-04 DIAGNOSIS — D49512 Neoplasm of unspecified behavior of left kidney: Secondary | ICD-10-CM

## 2018-04-04 DIAGNOSIS — D500 Iron deficiency anemia secondary to blood loss (chronic): Secondary | ICD-10-CM | POA: Diagnosis not present

## 2018-04-04 DIAGNOSIS — Q858 Other phakomatoses, not elsewhere classified: Secondary | ICD-10-CM

## 2018-04-04 DIAGNOSIS — G8220 Paraplegia, unspecified: Secondary | ICD-10-CM | POA: Diagnosis not present

## 2018-04-04 DIAGNOSIS — D496 Neoplasm of unspecified behavior of brain: Secondary | ICD-10-CM | POA: Diagnosis not present

## 2018-04-04 DIAGNOSIS — Z79899 Other long term (current) drug therapy: Secondary | ICD-10-CM

## 2018-04-04 DIAGNOSIS — C7A8 Other malignant neuroendocrine tumors: Secondary | ICD-10-CM

## 2018-04-04 LAB — CMP (CANCER CENTER ONLY)
ALBUMIN: 3.1 g/dL — AB (ref 3.5–5.0)
ALK PHOS: 92 U/L — AB (ref 26–84)
ALT: 20 U/L (ref 10–47)
ANION GAP: 7 (ref 5–15)
AST: 21 U/L (ref 11–38)
BILIRUBIN TOTAL: 0.7 mg/dL (ref 0.2–1.6)
BUN: 19 mg/dL (ref 7–22)
CALCIUM: 8.9 mg/dL (ref 8.0–10.3)
CO2: 28 mmol/L (ref 18–33)
CREATININE: 0.7 mg/dL (ref 0.60–1.20)
Chloride: 101 mmol/L (ref 98–108)
Glucose, Bld: 212 mg/dL — ABNORMAL HIGH (ref 73–118)
Potassium: 4.1 mmol/L (ref 3.3–4.7)
Sodium: 136 mmol/L (ref 128–145)
TOTAL PROTEIN: 6.8 g/dL (ref 6.4–8.1)

## 2018-04-04 LAB — CBC WITH DIFFERENTIAL (CANCER CENTER ONLY)
BASOS ABS: 0 10*3/uL (ref 0.0–0.1)
BASOS PCT: 0 %
Eosinophils Absolute: 0.1 10*3/uL (ref 0.0–0.5)
Eosinophils Relative: 2 %
HEMATOCRIT: 43.9 % (ref 38.7–49.9)
HEMOGLOBIN: 14.3 g/dL (ref 13.0–17.1)
Lymphocytes Relative: 24 %
Lymphs Abs: 1.9 10*3/uL (ref 0.9–3.3)
MCH: 28.2 pg (ref 28.0–33.4)
MCHC: 32.6 g/dL (ref 32.0–35.9)
MCV: 86.6 fL (ref 82.0–98.0)
MONO ABS: 0.7 10*3/uL (ref 0.1–0.9)
Monocytes Relative: 9 %
NEUTROS ABS: 5.3 10*3/uL (ref 1.5–6.5)
NEUTROS PCT: 65 %
Platelet Count: 292 10*3/uL (ref 145–400)
RBC: 5.07 MIL/uL (ref 4.20–5.70)
RDW: 13.3 % (ref 11.1–15.7)
WBC Count: 8.1 10*3/uL (ref 4.0–10.0)

## 2018-04-04 LAB — IRON AND TIBC
IRON: 45 ug/dL (ref 42–163)
Saturation Ratios: 18 % — ABNORMAL LOW (ref 42–163)
TIBC: 254 ug/dL (ref 202–409)
UIBC: 209 ug/dL

## 2018-04-04 LAB — FERRITIN: FERRITIN: 138 ng/mL (ref 22–316)

## 2018-04-04 LAB — LACTATE DEHYDROGENASE: LDH: 172 U/L (ref 125–245)

## 2018-04-04 MED ORDER — LANREOTIDE ACETATE 120 MG/0.5ML ~~LOC~~ SOLN
SUBCUTANEOUS | Status: AC
  Filled 2018-04-04: qty 120

## 2018-04-04 MED ORDER — BELLADONNA ALKALOIDS-OPIUM 16.2-30 MG RE SUPP: 30.0000 mg | Freq: Two times a day (BID) | RECTAL | 0 refills | Status: DC | PRN

## 2018-04-04 MED ORDER — LANREOTIDE ACETATE 120 MG/0.5ML ~~LOC~~ SOLN
120.0000 mg | Freq: Once | SUBCUTANEOUS | Status: AC
  Administered 2018-04-04: 120 mg via SUBCUTANEOUS

## 2018-04-04 MED FILL — BELLADONNA-OPIUM 16.2-30 SU: 16.2-30 | 6 days supply | Qty: 12 | Fill #0

## 2018-04-04 NOTE — Progress Notes (Signed)
Hematology and Oncology Follow Up Visit  [name] [id number] [birth year] 57 y.o. 01/09/2018   Principle Diagnosis:   Metastatic neuroendocrine carcinoma  Von Hipple-Lindau Syndrome  Intermittent iron deficiency anemia secondary to GI bleeding.  Current Therapy:  Temodar/Xeloda - start 03/17/2016 - s/p c#3 - discontinued Somatuline 120mg  SQ q month IV iron as indicated-Feraheme given on 08/29/2017     Interim History:  Mr.  [name] is back for followup.  He did have a dotatate scan for his neuroendocrine cancers.  Thankfully, the scan lights up in the liver for his metastasis.  Because of this, I think would be a great candidate for Lutathera.  I will put the referral in for radiology to see him to do this.  He has not yet seen the urologist for his kidney cancer issue.  It sounds like he is going to have to go to an academic center for his surgery.  I am unsure if he can have a partial nephrectomy or if he will need to have the whole kidney taken out.  His renal function has been doing fine.  I would think that if he needed to have a kidney removed, that the other kidney would compensate.  He has had no problems with nausea or vomiting.  His pain is fairly under good control.  He does get abdominal spasms.  He says what helps with the spasms or B&O suppositories.  I went ahead and gave him a prescription for this.  His blood sugars have been doing fairly well.  He has had no fever.  He has had no bleeding.  He has had no leg swelling.  He is paraplegic with no neurologic activity below the waist.  His CNS metastases are being followed.  He has a little bit of ptosis with the right eye.  His appetite is doing pretty well.  Overall, I said his performance status is ECOG 1.      Medications:  Current Outpatient Medications:  .  ACCU-CHEK SMARTVIEW test strip, , Disp: , Rfl: 10 .  baclofen (LIORESAL) 10 mg/mL SUSP, , Disp: , Rfl:  .  baclofen (LIORESAL) 20 MG tablet,  , Disp: , Rfl:  .  Balsam Peru-Castor Oil (VENELEX) OINT, Apply topically as needed, Disp: 60 g, Rfl: 4 .  capecitabine (XELODA) 500 MG tablet, Take by mouth., Disp: , Rfl:  .  chlorproMAZINE (THORAZINE) 10 MG tablet, Take by mouth., Disp: , Rfl:  .  diazepam (VALIUM) 5 MG tablet, TAKE 1 TABLET BY MOUTH 3 TIMES DAILY FOR SPASMS/ANXIETY, Disp: 90 tablet, Rfl: 1 .  diphenoxylate-atropine (LOMOTIL) 2.5-0.025 MG tablet, TAKE 1 TABLET BY MOUTH 4 TIMES DAILY AS NEEDED FOR DIARRHEA OR LOOSE STOOLS, Disp: 100 tablet, Rfl: 0 .  doxazosin (CARDURA) 1 MG tablet, Take 1 tablet (1 mg total) by mouth 2 (two) times daily. (Patient taking differently: Take 1 mg by mouth 2 (two) times daily as needed (blood pressure spikes.). ), Disp: 60 tablet, Rfl: 2 .  dronabinol (MARINOL) 5 MG capsule, TAKE 1 CAPSULE BY MOUTH TWICE DAILY WITH MEALS, Disp: 60 capsule, Rfl: 3 .  gabapentin (NEURONTIN) 300 MG capsule, TAKE 2 CAPSULES BY MOUTH 3 TIMES DAILY., Disp: 180 capsule, Rfl: 6 .  HYDROcodone-acetaminophen (NORCO/VICODIN) 5-325 MG tablet, Take 1-2 tablets by mouth every 6 (six) hours as needed for moderate pain., Disp: 90 tablet, Rfl: 0 .  HYDROmorphone (DILAUDID) 4 MG tablet, Take 1 tablet (4 mg total) by mouth every 6 (six) hours as needed  for severe pain., Disp: 40 tablet, Rfl: 0 .  insulin glargine (LANTUS) 100 UNIT/ML injection, Inject 31 Units into the skin daily before breakfast. , Disp: , Rfl:  .  insulin lispro (HUMALOG) 100 UNIT/ML injection, Inject 3-9 Units into the skin 3 (three) times daily before meals. SS, Disp: , Rfl:  .  lipase/protease/amylase (CREON) 12000 units CPEP capsule, Take by mouth., Disp: , Rfl:  .  midodrine (PROAMATINE) 5 MG tablet, TKAE 1/2 TABLET BY MOUTH TWICE A DAY AS NEEDED, Disp: 60 tablet, Rfl: 6 .  NON FORMULARY, Take by mouth., Disp: , Rfl:  .  oxybutynin (DITROPAN) 5 MG tablet, 5 mg 2 (two) times daily. , Disp: , Rfl: 3 .  promethazine (PHENERGAN) 12.5 MG tablet, Take 12.5 mg by mouth  every 6 (six) hours as needed for nausea. , Disp: , Rfl:  .  tiZANidine (ZANAFLEX) 4 MG tablet, TAKE 2 TABLETS BY MOUTH EVERY 6 HOURS AS NEEDED FOR PAIN, Disp: 30 tablet, Rfl: 3 .  traMADol (ULTRAM) 50 MG tablet, TAKE 1 TABLET BY MOUTH 3 TIMES DAILY, Disp: 90 tablet, Rfl: 2  Allergies:  Allergies  Allergen Reactions  . Citalopram Diarrhea  . Ciprocin-Fluocin-Procin [Fluocinolone Acetonide] Rash    Pt states this happened with IV Cipro. ABLE TO TAKE PO CIPRO.  . Other Rash    IV-CIPRO    Past Medical History, Surgical history, Social history, and Family History were reviewed and updated.  Review of Systems: Review of Systems  Constitutional: Negative.   HENT: Negative.   Eyes: Negative.   Respiratory: Negative.   Cardiovascular: Negative.   Gastrointestinal: Positive for diarrhea and nausea.  Genitourinary: Positive for frequency.  Musculoskeletal: Positive for myalgias and neck pain.  Skin: Negative.   Neurological: Negative.   Endo/Heme/Allergies: Negative.   Psychiatric/Behavioral: Negative.      Physical Exam:  oral temperature is 97.5 F (36.4 C) (abnormal). His blood pressure is 82/54 (abnormal) and his pulse is 94. His respiration is 18 and oxygen saturation is 99%.   Physical Exam  Constitutional: He is oriented to person, place, and time.  HENT:  Head: Normocephalic and atraumatic.  Mouth/Throat: Oropharynx is clear and moist.  Eyes: Pupils are equal, round, and reactive to light. EOM are normal.  Neck: Normal range of motion.  Cardiovascular: Normal rate, regular rhythm and normal heart sounds.  Pulmonary/Chest: Effort normal and breath sounds normal.  Abdominal: Soft. Bowel sounds are normal.  He has a suprapubic catheter in place  Musculoskeletal: Normal range of motion. He exhibits no edema, tenderness or deformity.  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  Neurological: He is alert and oriented to  person, place, and time.  Skin: Skin is warm and dry. No rash noted. No erythema.  Psychiatric: He has a normal mood and affect. His behavior is normal. Judgment and thought content normal.  Vitals reviewed. .  Lab Results  Component Value Date   WBC 11.3 (H) 01/09/2018   HGB 13.8 12/05/2017   HCT 43.8 01/09/2018   MCV 86.4 01/09/2018   PLT 288 01/09/2018     Chemistry      Component Value Date/Time   NA 137 01/09/2018 1118   NA 137 12/05/2017 0840   NA 138 10/12/2016 0955   K 3.3 01/09/2018 1118   K 3.4 12/05/2017 0840   K 3.3 (L) 10/12/2016 0955   CL 104 01/09/2018 1118   CL 101 12/05/2017 0840  CO2 30 01/09/2018 1118   CO2 29 12/05/2017 0840   CO2 25 10/12/2016 0955   BUN 14 01/09/2018 1118   BUN 16 12/05/2017 0840   BUN 22.0 10/12/2016 0955   CREATININE 0.8 12/05/2017 0840   CREATININE 0.8 10/12/2016 0955      Component Value Date/Time   CALCIUM 8.8 01/09/2018 1118   CALCIUM 8.6 12/05/2017 0840   CALCIUM 9.0 10/12/2016 0955   ALKPHOS 101 (H) 01/09/2018 1118   ALKPHOS 139 (H) 12/05/2017 0840   ALKPHOS 159 (H) 10/12/2016 0955   AST 20 01/09/2018 1118   AST 45 (H) 10/12/2016 0955   ALT 12 01/09/2018 1118   ALT 19 12/05/2017 0840   ALT 29 10/12/2016 0955   BILITOT 0.8 01/09/2018 1118   BILITOT 0.62 10/12/2016 0955     Impression and Plan: Mr. Ritacco is 57 year old gentleman with von Hippel-Lindau syndrome. He has multiple neuroendocrine tumors. 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 He's had multiple spinal surgeries.  Hopefully, radiology will be able to get him soon for the consideration for Lutathera.  Again I think he would be a very good candidate for this.  The real question is going to be his kidney tumors.  Again trying to get these removed without sacrificing his renal function is going to be very tricky.  I will plan to see him back in another month.   Volanda Napoleon, MD 2/4/20196:20 PM

## 2018-04-05 ENCOUNTER — Telehealth: Payer: Self-pay | Admitting: *Deleted

## 2018-04-05 NOTE — Telephone Encounter (Addendum)
Patient is aware of results. Message sent to scheduler.   ----- Message from Volanda Napoleon, MD sent at 04/05/2018  6:08 AM EDT ----- Call - iron is still low.  Need 1 dose of iv iron.  Please set this up.  pete

## 2018-04-06 LAB — CHROMOGRANIN A: CHROMOGRANIN A: 4 nmol/L (ref 0–5)

## 2018-04-06 MED FILL — VESIcare 5 MG TABS: 5 | 30 days supply | Qty: 30 | Fill #2

## 2018-04-10 MED FILL — FREESTYLE LITE TEST STRIP: 90 days supply | Qty: 400 | Fill #0

## 2018-04-12 ENCOUNTER — Inpatient Hospital Stay: Payer: 59 | Attending: Hematology & Oncology

## 2018-04-12 VITALS — BP 140/108 | HR 71 | Temp 97.9°F | Resp 17

## 2018-04-12 DIAGNOSIS — D500 Iron deficiency anemia secondary to blood loss (chronic): Secondary | ICD-10-CM | POA: Diagnosis not present

## 2018-04-12 DIAGNOSIS — C7A8 Other malignant neuroendocrine tumors: Secondary | ICD-10-CM | POA: Diagnosis not present

## 2018-04-12 DIAGNOSIS — K922 Gastrointestinal hemorrhage, unspecified: Secondary | ICD-10-CM | POA: Diagnosis not present

## 2018-04-12 DIAGNOSIS — Z79899 Other long term (current) drug therapy: Secondary | ICD-10-CM | POA: Insufficient documentation

## 2018-04-12 DIAGNOSIS — Q858 Other phakomatoses, not elsewhere classified: Secondary | ICD-10-CM | POA: Insufficient documentation

## 2018-04-12 DIAGNOSIS — C642 Malignant neoplasm of left kidney, except renal pelvis: Secondary | ICD-10-CM | POA: Diagnosis not present

## 2018-04-12 MED ORDER — SODIUM CHLORIDE 0.9 % IV SOLN
Freq: Once | INTRAVENOUS | Status: AC
  Administered 2018-04-12: 10:00:00 via INTRAVENOUS

## 2018-04-12 MED ORDER — SODIUM CHLORIDE 0.9 % IV SOLN
510.0000 mg | Freq: Once | INTRAVENOUS | Status: AC
  Administered 2018-04-12: 510 mg via INTRAVENOUS
  Filled 2018-04-12: qty 17

## 2018-04-12 NOTE — Progress Notes (Signed)
Patient could not stay full 30 mins post Feraheme due to other medical appts. He reports feeling well and left with spouse.

## 2018-04-13 MED FILL — traMADol HCL 50 MG TABS: 50 | 30 days supply | Qty: 90 | Fill #1

## 2018-04-13 MED FILL — CREON DR 24,000 UNITS CAP: 24000-76000 | 30 days supply | Qty: 270 | Fill #2

## 2018-04-23 MED FILL — LANTUS 100 UNITS/ML VIAL: 100 | 28 days supply | Qty: 10 | Fill #2

## 2018-04-23 MED FILL — DRONABINOL 5 MG CAP: 5 | 30 days supply | Qty: 60 | Fill #1

## 2018-04-23 MED FILL — BACLOFEN 20 MG TABLET: 20 | 15 days supply | Qty: 120 | Fill #3

## 2018-04-26 ENCOUNTER — Other Ambulatory Visit: Payer: Self-pay | Admitting: Hematology & Oncology

## 2018-04-26 DIAGNOSIS — K909 Intestinal malabsorption, unspecified: Secondary | ICD-10-CM

## 2018-04-26 DIAGNOSIS — R197 Diarrhea, unspecified: Principal | ICD-10-CM

## 2018-04-26 DIAGNOSIS — C7A8 Other malignant neuroendocrine tumors: Secondary | ICD-10-CM

## 2018-04-26 MED FILL — GABAPENTIN 300 MG CAPSULE: 300 | 30 days supply | Qty: 180 | Fill #6

## 2018-04-26 MED FILL — DIPHENOXYLATE-ATROPINE 2.5-: 2.5-0.025 | 25 days supply | Qty: 100 | Fill #0

## 2018-05-02 ENCOUNTER — Other Ambulatory Visit: Payer: Self-pay | Admitting: *Deleted

## 2018-05-02 DIAGNOSIS — C7A8 Other malignant neuroendocrine tumors: Secondary | ICD-10-CM

## 2018-05-03 ENCOUNTER — Inpatient Hospital Stay: Payer: 59

## 2018-05-03 ENCOUNTER — Inpatient Hospital Stay (HOSPITAL_BASED_OUTPATIENT_CLINIC_OR_DEPARTMENT_OTHER): Payer: 59 | Admitting: Hematology & Oncology

## 2018-05-03 ENCOUNTER — Other Ambulatory Visit: Payer: Self-pay

## 2018-05-03 VITALS — BP 166/107 | HR 121 | Temp 97.5°F | Resp 18

## 2018-05-03 DIAGNOSIS — K922 Gastrointestinal hemorrhage, unspecified: Secondary | ICD-10-CM

## 2018-05-03 DIAGNOSIS — Q858 Other phakomatoses, not elsewhere classified: Secondary | ICD-10-CM

## 2018-05-03 DIAGNOSIS — C7A8 Other malignant neuroendocrine tumors: Secondary | ICD-10-CM | POA: Diagnosis not present

## 2018-05-03 DIAGNOSIS — D500 Iron deficiency anemia secondary to blood loss (chronic): Secondary | ICD-10-CM

## 2018-05-03 DIAGNOSIS — Z79899 Other long term (current) drug therapy: Secondary | ICD-10-CM

## 2018-05-03 LAB — CMP (CANCER CENTER ONLY)
ALBUMIN: 3.2 g/dL — AB (ref 3.5–5.0)
ALT: 35 U/L (ref 10–47)
ANION GAP: 5 (ref 5–15)
AST: 40 U/L — ABNORMAL HIGH (ref 11–38)
Alkaline Phosphatase: 115 U/L — ABNORMAL HIGH (ref 26–84)
BILIRUBIN TOTAL: 0.8 mg/dL (ref 0.2–1.6)
BUN: 13 mg/dL (ref 7–22)
CHLORIDE: 102 mmol/L (ref 98–108)
CO2: 30 mmol/L (ref 18–33)
CREATININE: 1 mg/dL (ref 0.60–1.20)
Calcium: 9 mg/dL (ref 8.0–10.3)
Glucose, Bld: 225 mg/dL — ABNORMAL HIGH (ref 73–118)
Potassium: 3.9 mmol/L (ref 3.3–4.7)
Sodium: 137 mmol/L (ref 128–145)
Total Protein: 7.1 g/dL (ref 6.4–8.1)

## 2018-05-03 LAB — CBC WITH DIFFERENTIAL (CANCER CENTER ONLY)
BASOS ABS: 0 10*3/uL (ref 0.0–0.1)
BASOS PCT: 0 %
EOS PCT: 1 %
Eosinophils Absolute: 0.1 10*3/uL (ref 0.0–0.5)
HEMATOCRIT: 46.3 % (ref 38.7–49.9)
Hemoglobin: 15.2 g/dL (ref 13.0–17.1)
Lymphocytes Relative: 16 %
Lymphs Abs: 1.2 10*3/uL (ref 0.9–3.3)
MCH: 28.7 pg (ref 28.0–33.4)
MCHC: 32.8 g/dL (ref 32.0–35.9)
MCV: 87.4 fL (ref 82.0–98.0)
MONO ABS: 0.5 10*3/uL (ref 0.1–0.9)
Monocytes Relative: 7 %
NEUTROS ABS: 5.8 10*3/uL (ref 1.5–6.5)
Neutrophils Relative %: 76 %
Platelet Count: 237 10*3/uL (ref 145–400)
RBC: 5.3 MIL/uL (ref 4.20–5.70)
RDW: 13.3 % (ref 11.1–15.7)
WBC Count: 7.6 10*3/uL (ref 4.0–10.0)

## 2018-05-03 LAB — LACTATE DEHYDROGENASE: LDH: 163 U/L (ref 125–245)

## 2018-05-03 MED ORDER — LANREOTIDE ACETATE 120 MG/0.5ML ~~LOC~~ SOLN
120.0000 mg | Freq: Once | SUBCUTANEOUS | Status: AC
  Administered 2018-05-03: 120 mg via SUBCUTANEOUS

## 2018-05-03 MED ORDER — FERUMOXYTOL INJECTION 510 MG/17 ML: 510.0000 mg | Freq: Once | INTRAVENOUS | Status: DC

## 2018-05-03 NOTE — Progress Notes (Signed)
Hematology and Oncology Follow Up Visit  [name] [id number] Feb 20, 1961 57 y.o. 01/09/2018   Principle Diagnosis:   Metastatic neuroendocrine carcinoma  Von Hipple-Lindau Syndrome  Intermittent iron deficiency anemia secondary to GI bleeding.  Current Therapy:  Temodar/Xeloda - start 03/17/2016 - s/p c#3 - discontinued Somatuline 120mg  SQ q month IV iron as indicated-Feraheme given on 04/19/2018     Interim History:  Mr.  [name] is back for followup.  So far, he has seen urology.  He has been referred to [facility] urology for a consideration of doing a partial nephrectomy for his left kidney.  Hopefully, this will be able to be done.  It would be an open procedure however.    He has not yet had his Lutathera.  He needs to have this preauthorized.  We are trying to work on this.  I did read an article regarding a clinical trial that utilize immunotherapy for neuroendocrine cancers.  This might be something that would be interesting.  He has had no nausea or vomiting.  He has had no issues with his urine.  He has had no fever.  His blood pressure is on the high side today.  There is been no issues with diarrhea.  He did get iron a month or so ago.  This always helps him feel better.  His blood sugars have been pretty well controlled by his wife.    Overall, I said his performance status is ECOG 1.      Medications:  Current Outpatient Medications:  .  ACCU-CHEK SMARTVIEW test strip, , Disp: , Rfl: 10 .  baclofen (LIORESAL) 10 mg/mL SUSP, , Disp: , Rfl:  .  baclofen (LIORESAL) 20 MG tablet, , Disp: , Rfl:  .  Balsam [place]-Castor Oil (VENELEX) OINT, Apply topically as needed, Disp: 60 g, Rfl: 4 .  capecitabine (XELODA) 500 MG tablet, Take by mouth., Disp: , Rfl:  .  chlorproMAZINE (THORAZINE) 10 MG tablet, Take by mouth., Disp: , Rfl:  .  diazepam (VALIUM) 5 MG tablet, TAKE 1 TABLET BY MOUTH 3 TIMES DAILY FOR SPASMS/ANXIETY, Disp: 90 tablet, Rfl: 1 .   diphenoxylate-atropine (LOMOTIL) 2.5-0.025 MG tablet, TAKE 1 TABLET BY MOUTH 4 TIMES DAILY AS NEEDED FOR DIARRHEA OR LOOSE STOOLS, Disp: 100 tablet, Rfl: 0 .  doxazosin (CARDURA) 1 MG tablet, Take 1 tablet (1 mg total) by mouth 2 (two) times daily. (Patient taking differently: Take 1 mg by mouth 2 (two) times daily as needed (blood pressure spikes.). ), Disp: 60 tablet, Rfl: 2 .  dronabinol (MARINOL) 5 MG capsule, TAKE 1 CAPSULE BY MOUTH TWICE DAILY WITH MEALS, Disp: 60 capsule, Rfl: 3 .  gabapentin (NEURONTIN) 300 MG capsule, TAKE 2 CAPSULES BY MOUTH 3 TIMES DAILY., Disp: 180 capsule, Rfl: 6 .  HYDROcodone-acetaminophen (NORCO/VICODIN) 5-325 MG tablet, Take 1-2 tablets by mouth every 6 (six) hours as needed for moderate pain., Disp: 90 tablet, Rfl: 0 .  HYDROmorphone (DILAUDID) 4 MG tablet, Take 1 tablet (4 mg total) by mouth every 6 (six) hours as needed for severe pain., Disp: 40 tablet, Rfl: 0 .  insulin glargine (LANTUS) 100 UNIT/ML injection, Inject 31 Units into the skin daily before breakfast. , Disp: , Rfl:  .  insulin lispro (HUMALOG) 100 UNIT/ML injection, Inject 3-9 Units into the skin 3 (three) times daily before meals. SS, Disp: , Rfl:  .  lipase/protease/amylase (CREON) 12000 units CPEP capsule, Take by mouth., Disp: , Rfl:  .  midodrine (PROAMATINE) 5 MG  tablet, TKAE 1/2 TABLET BY MOUTH TWICE A DAY AS NEEDED, Disp: 60 tablet, Rfl: 6 .  NON FORMULARY, Take by mouth., Disp: , Rfl:  .  oxybutynin (DITROPAN) 5 MG tablet, 5 mg 2 (two) times daily. , Disp: , Rfl: 3 .  promethazine (PHENERGAN) 12.5 MG tablet, Take 12.5 mg by mouth every 6 (six) hours as needed for nausea. , Disp: , Rfl:  .  tiZANidine (ZANAFLEX) 4 MG tablet, TAKE 2 TABLETS BY MOUTH EVERY 6 HOURS AS NEEDED FOR PAIN, Disp: 30 tablet, Rfl: 3 .  traMADol (ULTRAM) 50 MG tablet, TAKE 1 TABLET BY MOUTH 3 TIMES DAILY, Disp: 90 tablet, Rfl: 2  Allergies:  Allergies  Allergen Reactions  . Citalopram Diarrhea  .  Ciprocin-Fluocin-Procin [Fluocinolone Acetonide] Rash    Pt states this happened with IV Cipro. ABLE TO TAKE PO CIPRO.  . Other Rash    IV-CIPRO    Past Medical History, Surgical history, Social history, and Family History were reviewed and updated.  Review of Systems: Review of Systems  Constitutional: Negative.   HENT: Negative.   Eyes: Negative.   Respiratory: Negative.   Cardiovascular: Negative.   Gastrointestinal: Positive for diarrhea and nausea.  Genitourinary: Positive for frequency.  Musculoskeletal: Positive for myalgias and neck pain.  Skin: Negative.   Neurological: Negative.   Endo/Heme/Allergies: Negative.   Psychiatric/Behavioral: Negative.      Physical Exam:  oral temperature is 97.5 F (36.4 C) (abnormal). His blood pressure is 82/54 (abnormal) and his pulse is 94. His respiration is 18 and oxygen saturation is 99%.   Physical Exam  Constitutional: He is oriented to person, place, and time.  HENT:  Head: Normocephalic and atraumatic.  Mouth/Throat: Oropharynx is clear and moist.  Eyes: Pupils are equal, round, and reactive to light. EOM are normal.  Neck: Normal range of motion.  Cardiovascular: Normal rate, regular rhythm and normal heart sounds.  Pulmonary/Chest: Effort normal and breath sounds normal.  Abdominal: Soft. Bowel sounds are normal.  He has a suprapubic catheter in place  Musculoskeletal: Normal range of motion. He exhibits no edema, tenderness or deformity.  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  Neurological: He is alert and oriented to person, place, and time.  Skin: Skin is warm and dry. No rash noted. No erythema.  Psychiatric: He has a normal mood and affect. His behavior is normal. Judgment and thought content normal.  Vitals reviewed. .  Lab Results  Component Value Date   WBC 11.3 (H) 01/09/2018   HGB 13.8 12/05/2017   HCT 43.8 01/09/2018   MCV 86.4 01/09/2018   PLT 288  01/09/2018     Chemistry      Component Value Date/Time   NA 137 01/09/2018 1118   NA 137 12/05/2017 0840   NA 138 10/12/2016 0955   K 3.3 01/09/2018 1118   K 3.4 12/05/2017 0840   K 3.3 (L) 10/12/2016 0955   CL 104 01/09/2018 1118   CL 101 12/05/2017 0840   CO2 30 01/09/2018 1118   CO2 29 12/05/2017 0840   CO2 25 10/12/2016 0955   BUN 14 01/09/2018 1118   BUN 16 12/05/2017 0840   BUN 22.0 10/12/2016 0955   CREATININE 0.8 12/05/2017 0840   CREATININE 0.8 10/12/2016 0955      Component Value Date/Time   CALCIUM 8.8 01/09/2018 1118   CALCIUM 8.6 12/05/2017 0840   CALCIUM 9.0 10/12/2016 0955   ALKPHOS  101 (H) 01/09/2018 1118   ALKPHOS 139 (H) 12/05/2017 0840   ALKPHOS 159 (H) 10/12/2016 0955   AST 20 01/09/2018 1118   AST 45 (H) 10/12/2016 0955   ALT 12 01/09/2018 1118   ALT 19 12/05/2017 0840   ALT 29 10/12/2016 0955   BILITOT 0.8 01/09/2018 1118   BILITOT 0.62 10/12/2016 0955     Impression and Plan: Mr. Vonada is 57 year old gentleman with von Hippel-Lindau syndrome. He has multiple neuroendocrine tumors. 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 He's had multiple spinal surgeries.  There is still a lot going on with Mr. Chow.    He sees [facility] in 1 month.  I will plan to see him after he gets back from [facility].  Hopefully, he will also have started the glue to therapy protocol.    Volanda Napoleon, MD 2/4/20196:20 PM

## 2018-05-03 NOTE — Patient Instructions (Signed)
Lanreotide injection What is this medicine? LANREOTIDE (lan REE oh tide) is used to reduce blood levels of growth hormone in patients with a condition called acromegaly. It also works to slow or stop tumor growth in patients with neuroendocrine tumors and treat carcinoid syndrome. This medicine may be used for other purposes; ask your health care provider or pharmacist if you have questions. COMMON BRAND NAME(S): Somatuline [place] What should I tell my health care provider before I take this medicine? They need to know if you have any of these conditions: -diabetes -gallbladder disease -heart disease -kidney disease -liver disease -thyroid disease -an unusual or allergic reaction to lanreotide, other medicines, foods, dyes, or preservatives -pregnant or trying to get pregnant -breast-feeding How should I use this medicine? This medicine is for injection under the skin. It is given by a health care professional in a hospital or clinic setting. Contact your pediatrician or health care professional regarding the use of this medicine in children. Special care may be needed. Overdosage: If you think you have taken too much of this medicine contact a poison control center or emergency room at once. NOTE: This medicine is only for you. Do not share this medicine with others. What if I miss a dose? It is important not to miss your dose. Call your doctor or health care professional if you are unable to keep an appointment. What may interact with this medicine? This medicine may interact with the following medications: -bromocriptine -cyclosporine -certain medicines for blood pressure, heart disease, irregular heart beat -certain medicines for diabetes -quinidine -terfenadine This list may not describe all possible interactions. Give your health care provider a list of all the medicines, herbs, non-prescription drugs, or dietary supplements you use. Also tell them if you smoke, drink alcohol, or  use illegal drugs. Some items may interact with your medicine. What should I watch for while using this medicine? Tell your doctor or healthcare professional if your symptoms do not start to get better or if they get worse. Visit your doctor or health care professional for regular checks on your progress. Your condition will be monitored carefully while you are receiving this medicine. You may need blood work done while you are taking this medicine. Women should inform their doctor if they wish to become pregnant or think they might be pregnant. There is a potential for serious side effects to an unborn child. Talk to your health care professional or pharmacist for more information. Do not breast-feed an infant while taking this medicine or for 6 months after stopping it. This medicine has caused ovarian failure in some women. This medicine may interfere with the ability to have a child. Talk with your doctor or health care professional if you are concerned about your fertility. What side effects may I notice from receiving this medicine? Side effects that you should report to your doctor or health care professional as soon as possible: -allergic reactions like skin rash, itching or hives, swelling of the face, lips, or tongue -increased blood pressure -severe stomach pain -signs and symptoms of high blood sugar such as dizziness; dry mouth; dry skin; fruity breath; nausea; stomach pain; increased hunger or thirst; increased urination -signs and symptoms of low blood sugar such as feeling anxious; confusion; dizziness; increased hunger; unusually weak or tired; sweating; shakiness; cold; irritable; headache; blurred vision; fast heartbeat; loss of consciousness -unusually slow heartbeat Side effects that usually do not require medical attention (report to your doctor or health care professional if they continue   or are bothersome): -constipation -diarrhea -dizziness -headache -muscle pain -muscle  spasms -nausea -pain, redness, or irritation at site where injected This list may not describe all possible side effects. Call your doctor for medical advice about side effects. You may report side effects to FDA at 1-800-FDA-1088. Where should I keep my medicine? This drug is given in a hospital or clinic and will not be stored at home. NOTE: This sheet is a summary. It may not cover all possible information. If you have questions about this medicine, talk to your doctor, pharmacist, or health care provider.  2018 Elsevier/Gold Standard (2016-08-27 10:33:47)  

## 2018-05-04 LAB — IRON AND TIBC
Iron: 69 ug/dL (ref 42–163)
SATURATION RATIOS: 26 % — AB (ref 42–163)
TIBC: 261 ug/dL (ref 202–409)
UIBC: 193 ug/dL

## 2018-05-04 LAB — FERRITIN: Ferritin: 248 ng/mL (ref 22–316)

## 2018-05-08 ENCOUNTER — Other Ambulatory Visit: Payer: Self-pay | Admitting: Hematology & Oncology

## 2018-05-08 DIAGNOSIS — C7A8 Other malignant neuroendocrine tumors: Secondary | ICD-10-CM

## 2018-05-08 LAB — CHROMOGRANIN A: Chromogranin A: 11 nmol/L — ABNORMAL HIGH (ref 0–5)

## 2018-05-08 MED FILL — VESIcare 5 MG TABS: 5 | 30 days supply | Qty: 30 | Fill #3

## 2018-05-10 ENCOUNTER — Other Ambulatory Visit: Payer: Self-pay | Admitting: Hematology & Oncology

## 2018-05-10 MED FILL — tiZANidine HCL 4 MG TABS: 4 | 4 days supply | Qty: 30 | Fill #0

## 2018-05-12 DIAGNOSIS — E109 Type 1 diabetes mellitus without complications: Secondary | ICD-10-CM | POA: Diagnosis not present

## 2018-05-12 MED FILL — traMADol HCL 50 MG TABS: 50 | 30 days supply | Qty: 90 | Fill #2

## 2018-05-15 MED FILL — CREON DR 24,000 UNITS CAP: 24000-76000 | 30 days supply | Qty: 270 | Fill #3

## 2018-05-22 MED FILL — HUMALOG 100 UNITS/ML KWIKPE: 100 | 63 days supply | Qty: 15 | Fill #0

## 2018-05-23 MED FILL — LANTUS 100 UNITS/ML VIAL: 100 | 28 days supply | Qty: 10 | Fill #3

## 2018-05-23 MED FILL — DRONABINOL 5 MG CAP: 5 | 30 days supply | Qty: 60 | Fill #2

## 2018-05-24 ENCOUNTER — Ambulatory Visit (HOSPITAL_COMMUNITY)
Admission: RE | Admit: 2018-05-24 | Discharge: 2018-05-24 | Disposition: A | Discharge status: Home / Self Care | Payer: 59 | Source: Ambulatory Visit | Attending: Hematology & Oncology | Admitting: Hematology & Oncology

## 2018-05-24 DIAGNOSIS — C7A1 Malignant poorly differentiated neuroendocrine tumors: Secondary | ICD-10-CM | POA: Diagnosis not present

## 2018-05-24 DIAGNOSIS — C7A8 Other malignant neuroendocrine tumors: Secondary | ICD-10-CM

## 2018-05-24 NOTE — Consult Note (Signed)
Chief Complaint: Patient with metastatic neuroendocrine tumor was for  evaluation Peptide receptor radiotherapy (PRRT) with EH209 DOTATATE (Lutathera).  Referring Physician(s): Marin Olp, [facility]   Patient Status: [facility] - Out-pt  History of Present Illness: [name]. is a 57 y.o. 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  Patient status post pancreatectomy in 2009 with partial wedge resection of LEFT hepatic lobe.  Partial nephrectomy on the RIGHT for renal cell carcinomas.  Patient status post multiple surgeries of the CNS  hemangioblastomas (under care of neurosurgeon at [place]).   Patient presents for management of well differentiated neuroendocrine tumors to the liver.  Patient has undergone on 5  yttrium 90 microspheres therapies to the liver starting in 2011.  The last therapy was to the RIGHT hepatic lobe in August 2017.  Patient has had 3 therapies to the RIGHT hepatic lobe.  While the neuroendocrine tumor disease burden is much improved from 2011 there is been slow enlargement of several of the residual tumors within the RIGHT hepatic lobe.  These tumors are demonstrated to be avid for the DOTATATE gallium 68 radiotracer consistent with well differentiated neuroendocrine tumors.   Patient's chromogranin A is mildly elevated 10 after several years of less than 5.   Patient's reports mild symptoms which may be attributed to the neuroendocrine tumor including gastric bloating, diarrhea, and ill-defined abdominal pain.   Patient receives Samatuline 120 mg subcu every month.    Past Medical History:  Diagnosis Date  . Anemia 08/19/2014  . 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 Diabetes mellitus without complication ([place])   . Headache(784.0)    neuropathic pain usually behind eyes related to  blood pressure  . HTN (hypertension) 08/19/2014  . Iron deficiency anemia, unspecified 07/10/2014  . Neuroendocrine carcinoma metastatic to liver ([place])   . Orthostatic hypotension 08/19/2014  . Transfusion history    '09 .   Marland Kitchen Tumor of optic nerve    right -"stable"  . Von Hippel-Lindau syndrome ([place])    genetic disease that causes tumors    Past Surgical History:  Procedure Laterality Date  . BACK SURGERY     '91- T#-T5 "tumor resection"-benign  . CERVICAL SPINE SURGERY     '07- resection tumor C5-C7-"benign"  . 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 COLONOSCOPY WITH PROPOFOL N/A 08/20/2014   Procedure: COLONOSCOPY WITH PROPOFOL;  Surgeon: Cleotis Nipper, MD;  Location: [facility] END[facility]COPY;  Service: Endoscopy;  Laterality: N/A;  . CRANIOTOMY FOR TUMOR     medulla tumor "benign tumor"-salvage own bone for closure  . CYSTOSCOPY WITH LITHOLAPAXY N/A 02/04/2014   Procedure: CYSTOSCOPY WITH LITHOLAPAXY;  Surgeon: Franchot Gallo, MD;  Location: [facility] ORS;  Service: Urology;  Laterality: N/A;  . ENUCLEATION Left    '05 with prosthesis  . FLEXIBLE SIGMOIDOSCOPY N/A 09/26/2015   Procedure: FLEXIBLE SIGMOIDOSCOPY;  Surgeon: Laurence Spates, MD;  Location: [facility] ENDOSCOPY;  Service: Endoscopy;  Laterality: N/A;  . INSERTION OF SUPRAPUBIC CATHETER N/A 02/04/2014   Procedure: INSERTION OF SUPRAPUBIC CATHETER;  Surgeon: Franchot Gallo, MD;  Location: [facility] ORS;  Service: Urology;  Laterality: N/A;  . IR GENERIC HISTORICAL  07/15/2016   IR ANGIOGRAM VISCERAL SELECTIVE 07/15/2016 Arne Cleveland, MD [facility]  . IR GENERIC HISTORICAL  07/15/2016   IR EMBO ARTERIAL NOT HEMORR HEMANG INC  GUIDE ROADMAPPING 07/15/2016 Arne Cleveland, MD [facility]  . IR GENERIC HISTORICAL  07/15/2016   IR ANGIOGRAM SELECTIVE EACH ADDITIONAL VESSEL 07/15/2016 Arne Cleveland, MD [facility]  . IR GENERIC HISTORICAL  07/15/2016   IR ANGIOGRAM SELECTIVE EACH ADDITIONAL VESSEL 07/15/2016 Arne Cleveland,  MD [facility]  . IR GENERIC HISTORICAL  07/15/2016   IR ANGIOGRAM SELECTIVE EACH ADDITIONAL VESSEL 07/15/2016 Arne Cleveland, MD [facility]  . IR GENERIC HISTORICAL  07/15/2016   IR ANGIOGRAM SELECTIVE EACH ADDITIONAL VESSEL 07/15/2016 Arne Cleveland, MD [facility]  . IR GENERIC HISTORICAL  07/15/2016   IR US GUIDE VASC ACCESS RIGHT 07/15/2016 Arne Cleveland, MD [facility]  . IR GENERIC HISTORICAL  07/15/2016   IR ANGIOGRAM VISCERAL SELECTIVE 07/15/2016 Arne Cleveland, MD [facility]  . IR GENERIC HISTORICAL  07/15/2016   IR ANGIOGRAM SELECTIVE EACH ADDITIONAL VESSEL 07/15/2016 Arne Cleveland, MD [facility]  . IR GENERIC HISTORICAL  07/28/2016   IR ANGIOGRAM VISCERAL SELECTIVE 07/28/2016 Arne Cleveland, MD [facility]  . IR GENERIC HISTORICAL  07/28/2016   IR US GUIDE VASC ACCESS RIGHT 07/28/2016 Arne Cleveland, MD [facility]  . IR GENERIC HISTORICAL  07/28/2016   IR EMBO TUMOR ORGAN ISCHEMIA INFARCT INC GUIDE ROADMAPPING 07/28/2016 Arne Cleveland, MD [facility]  . IR GENERIC HISTORICAL  06/30/2016   IR RADIOLOGIST EVAL & MGMT 06/30/2016 Greggory Keen, MD [facility]  . MUSCLE FLAP MYOCUTANEOUS / FASCIOCUTANEOUS OF TRUNK Left    '01  . PANCREAS SURGERY     '09- with partial liver resection/ pancreas "Whipple"  . PARTIAL NEPHRECTOMY Right    '09- cancer  . PERIPHERALLY INSERTED CENTRAL CATHETER INSERTION     past hx.  . RECONSTRUCTION BREAST W/ TRAM FLAP      Allergies: Citalopram; Ciprocin-fluocin-procin [fluocinolone acetonide]; and Other  Medications: Prior to Admission medications   Medication Sig Start Date End Date Taking? Authorizing Provider  ACCU-CHEK SMARTVIEW test strip  05/10/16   [provider]  baclofen (LIORESAL) 20 MG tablet TAKE 1 TO 2 TABLETS BY MOUTH 4 TIMES A DAY 01/11/18   Volanda Napoleon, MD  [address] [facility]) OINT Apply topically as needed 03/17/16   Volanda Napoleon, MD  belladonna-opium (B&O SUPPRETTES) 16.2-30 MG  suppository Place 1 suppository (30 mg total) rectally 2 (two) times daily as needed for pain. 04/04/18   Volanda Napoleon, MD  capecitabine (XELODA) 500 MG tablet Take by mouth.    [provider]  CREON 24000-76000 units CPEP TAKE 3 CAPSULES BY MOUTH 3 TIMES DAILY WITH MEALS. 02/07/18   Volanda Napoleon, MD  diazepam (VALIUM) 5 MG tablet TAKE 1 TABLET BY MOUTH 3 TIMES DAILY FOR SPASMS/ANXIETY 10/11/17   Volanda Napoleon, MD  diphenoxylate-atropine (LOMOTIL) 2.5-0.025 MG tablet TAKE 1 TABLET BY MOUTH 4 TIMES DAILY AS NEEDED FOR DIARRHEA OR LOOSE STOOLS 04/26/18   Volanda Napoleon, MD  doxazosin (CARDURA) 1 MG tablet Take 1 tablet (1 mg total) by mouth 2 (two) times daily. Patient taking differently: Take 1 mg by mouth 2 (two) times daily as needed (blood pressure spikes.).  09/03/14   Volanda Napoleon, MD  dronabinol (MARINOL) 5 MG capsule TAKE 1 CAPSULE BY MOUTH TWICE DAILY WITH MEALS 03/24/18   Volanda Napoleon, MD  fentaNYL (DURAGESIC) 12 MCG/HR Place 1 patch (12.5 mcg total) onto the skin every 3 (three) days. 02/07/18   Volanda Napoleon, MD  gabapentin (NEURONTIN) 300 MG capsule TAKE 2 CAPSULES BY MOUTH 3  TIMES DAILY. 10/28/17   Volanda Napoleon, MD  HYDROcodone-acetaminophen (NORCO/VICODIN) 5-325 MG tablet Take 1-2 tablets by mouth every 6 (six) hours as needed for moderate pain. 11/01/17   Volanda Napoleon, MD  HYDROmorphone (DILAUDID) 4 MG tablet Take 1 tablet (4 mg total) by mouth every 6 (six) hours as needed for severe pain. 06/30/17   Volanda Napoleon, MD  insulin glargine (LANTUS) 100 UNIT/ML injection Inject 31 Units into the skin daily before breakfast.     [provider]  insulin lispro (HUMALOG) 100 UNIT/ML injection Inject 3-9 Units into the skin 3 (three) times daily before meals. SS    [provider]  lipase/protease/amylase (CREON) 12000 units CPEP capsule Take by mouth. 11/25/09   [provider]  midodrine (PROAMATINE) 5 MG tablet TKAE 1/2 TABLET BY  MOUTH TWICE A DAY AS NEEDED 05/04/17   Volanda Napoleon, MD  NON FORMULARY Take by mouth. 11/25/09   [provider]  oxybutynin (DITROPAN) 5 MG tablet 5 mg 2 (two) times daily.  05/10/16   [provider]  promethazine (PHENERGAN) 12.5 MG tablet Take 12.5 mg by mouth every 6 (six) hours as needed for nausea.     [provider]  solifenacin (VESICARE) 5 MG tablet Take 1 tablet (5 mg total) by mouth daily. 02/07/18   Volanda Napoleon, MD  tiZANidine (ZANAFLEX) 4 MG tablet TAKE 2 TABLETS BY MOUTH EVERY 6 HOURS AS NEEDED FOR PAIN 05/10/18   Volanda Napoleon, MD  traMADol (ULTRAM) 50 MG tablet TAKE 1 TABLET BY MOUTH 3 TIMES DAILY 03/07/18   Volanda Napoleon, MD     Family History  Problem Relation Age of Onset  . Heart failure Father     Social History   Socioeconomic History  . Marital status: Married    Spouse name: Not on file  . Number of children: Not on file  . Years of education: Not on file  . Highest education level: Not on file  Occupational History  . Not on file  Social Needs  . Financial resource strain: Not on file  . Food insecurity:    Worry: Not on file    Inability: Not on file  . Transportation needs:    Medical: Not on file    Non-medical: Not on file  Tobacco Use  . Smoking status: Former Smoker    Packs/day: 0.50    Years: 6.00    Pack years: 3.00    Types: Cigarettes    Start date: 01/15/1999    Last attempt to quit: 09/23/2008    Years since quitting: 9.6  . Smokeless tobacco: Never Used  . Tobacco comment: quit 6 years ago  Substance and Sexual Activity  . Alcohol use: Yes    Alcohol/week: 0.0 oz    Comment: only rare occasions  . Drug use: No  . Sexual activity: Never  Lifestyle  . Physical activity:    Days per week: Not on file    Minutes per session: Not on file  . Stress: Not on file  Relationships  . Social connections:    Talks on phone: Not on file    Gets together: Not on file    Attends religious service: Not  on file    Active member of club or organization: Not on file    Attends meetings of clubs or organizations: Not on file    Relationship status: Not on file  Other Topics Concern  . Not on  file  Social History Narrative  . Not on file    ECOG Status: 2 - Symptomatic, <50% confined to bed  Review of Systems: A 12 point ROS discussed and pertinent positives are indicated in the HPI above.  All other systems are negative.  Review of Systems  Eyes: Positive for visual disturbance.  Gastrointestinal: Positive for abdominal distention and diarrhea.  Genitourinary:       Foley cather. Some bladder irritation  Musculoskeletal:       Parapalegic  Hematological: Negative for adenopathy.    Vital Signs: There were no vitals taken for this visit.  Physical Exam  Constitutional: He is oriented to person, place, and time.  HENT:  Head: Normocephalic.  Neck: Normal range of motion. Neck supple.  Cardiovascular: Normal rate, regular rhythm and normal heart sounds.  Pulmonary/Chest: Effort normal and breath sounds normal.  Abdominal: Soft. Bowel sounds are normal. He exhibits distension (mild).  Musculoskeletal:  Wheel chair bound - parapeligic  Lymphadenopathy:    He has cervical adenopathy.  Neurological: He is alert and oriented to person, place, and time.  Skin: Skin is warm. Capillary refill takes less than 2 seconds.  Psychiatric: He has a normal mood and affect.    Imaging: Nm Pet (netspot Ga 68 Dotatate) Skull Base To Mid Thigh  Result Date: 04/03/2018 CLINICAL DATA:  Neuroendocrine tumor with liver metastasis. Patient status post yttrium 90 therapy to the LEFT and RIGHT hepatic lobes. Additional malignancies include renal cell carcinoma and spinal hemangioblastomas. Patient status post Whipple procedure for pancreatic malignancy. Von Hippel-Lindau syndrome. EXAM: NUCLEAR MEDICINE PET SKULL BASE TO THIGH TECHNIQUE: 5.1 mCi Ga 49 DOTATATE was injected intravenously. Full-ring  PET imaging was performed from the skull base to thigh after the radiotracer. CT data was obtained and used for attenuation correction and anatomic localization. COMPARISON:  CT 11/01/2017, 03/16/2016 FINDINGS: NECK No radiotracer activity in neck lymph nodes. Incidental CT findings: None CHEST No radiotracer accumulation within mediastinal or hilar lymph nodes. No suspicious pulmonary nodules on the CT scan. 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 Mild RIGHT basilar atelectasis ABDOMEN/PELVIS Eight foci of intense radiotracer accumulation within the liver consistent with well differentiated neuroendocrine tumor metastasis. Example lesion in the subcapsular RIGHT hepatic lobe measuring approximately 3 cm with SUV max equal 54.4. Small lesion in the anterior LEFT hepatic lobe with SUV max equal 68.6. No focal radiotracer accumulation in the pancreatic bed. Physiologic uptake in the adrenal glands. No abnormal accumulation of radiotracer within abdominopelvic lymph nodes. No abnormal radiotracer accumulation within the bowel. Physiologic activity noted in the liver, spleen, adrenal glands and kidneys. Incidental CT findings:Multiple cysts and solid lesions of the kidneys without radiotracer accumulation. Suprapubic catheter in place. Asymmetric uptake within the RIGHT lobe of the prostate gland is indeterminate. SKELETON Single focus of uptake within the the spinal canal at the T12 level which is mild with SUV max equal 3.6. This corresponds to enhancing lesion on comparison CT. No evidence of radiotracer accumulation within the skeleton bony skeleton. Incidental CT findings:None IMPRESSION: 1. Intense radiotracer accumulation within multiple hepatic lesions consistent with well differentiated neuroendocrine tumor. 2. No evidence of active neuroendocrine tumor outside of the liver. 3. Single focus of mild uptake within the central canal/spinal canal at T12 corresponds to enhancing lesion  on comparison CT likely represents a hemangiomablastoma Electronically Signed   By: Suzy Bouchard M.D.   On: 04/03/2018 14:47   Labs:  CBC: Recent Labs    02/07/18 1031  03/07/18 0914 04/04/18 1135 05/03/18 1036  WBC 13.0* 8.2 8.1 7.6  HGB 14.8 13.5 14.3 15.2  HCT 45.5 41.4 43.9 46.3  PLT 213 187 292 237    COAGS: No results for input(s): INR, APTT in the last 8760 hours.  BMP: Recent Labs    02/07/18 1031 03/07/18 0914 04/04/18 1135 05/03/18 1036  NA 138 139 136 137  K 3.9 4.3 4.1 3.9  CL 102 100 101 102  CO2 29 29 28 30   GLUCOSE 287* 223* 212* 225*  BUN 11 15 19 13   CALCIUM 8.9 8.7 8.9 9.0  CREATININE 0.70 0.70 0.70 1.00    LIVER FUNCTION TESTS: Recent Labs    02/07/18 1031 03/07/18 0914 04/04/18 1135 05/03/18 1036  BILITOT 0.8 0.7 0.7 0.8  AST 26 25 21  40*  ALT 18 9* 20 35  ALKPHOS 123* 97* 92* 115*  PROT 7.1 6.8 6.8 7.1  ALBUMIN 3.1* 3.0* 3.1* 3.2*    TUMOR MARKERS: Recent Labs    08/24/17 1427 09/22/17 1448 11/01/17 1132 12/05/17 0840  CHROMOGRNA 5 3 3 4    Chromogranin A - 5/19 = 10    Assessment and Plan:  The patient appears to be a good candidate for peptide receptor radiotherapy with several caveats.  Patient has well differentiated neuroendocrine tumor metastasis within the liver.  Overall liver tumor burden is mild but slowly progressing.  Patient would benefit from improved progression free survival with Lu 177  DOTATATE therapy.   Patient's chromogranin level is slightly elevated but remains low at 10.  Patient has minimal carcinoid symptoms.  Symptoms include bloating and diarrhea.  Patient does receive 120 mg Lanreotide injections which is high  dose.   Caveat for therapy would include 3 prior yttrium 90 radio therapies to the RIGHT hepatic lobe.  The last therapy was in August 2017 almost 2 years prior.  Will closely monitor liver function in during treatment.   Second caveat: Patient scheduled for evaluation for resection  of renal cell carcinomas.  Question is whether to proceed with peptide receptor radiotherapy before or after potential renal cell carcinoma resection.  Will monitor renal toxicity closely during therapy.   Thirdly patient has CNS tumor which may require resection during the next 6 months.   Patient potentially scheduled for first peptide receptor radiotherapy on 07/04/2018 pending consult with [facility] urology.  Patient can receive next Sandostatin injection scheduled for on about 6 26 19.  Pleasure meeting Mr. Canarpe and his wife and look forward to involvement in their therapy.   Thank you for this interesting consult.  I greatly enjoyed meeting Angus [employer]. and look forward to participating in their care.  A copy of this report was sent to the requesting provider on this date.  Electronically Signed: Rennis Golden, MD 05/24/2018, 3:31 PM   I spent a total of  40 Minutes  in face to face in clinical consultation, greater than 50% of which was counseling/coordinating care for metastatic neuroendocrine tumor.

## 2018-05-25 ENCOUNTER — Other Ambulatory Visit: Payer: Self-pay | Admitting: Hematology & Oncology

## 2018-05-25 DIAGNOSIS — Z23 Encounter for immunization: Secondary | ICD-10-CM

## 2018-05-25 DIAGNOSIS — C7A8 Other malignant neuroendocrine tumors: Secondary | ICD-10-CM

## 2018-05-25 MED FILL — GABAPENTIN 300 MG CAPSULE: 300 | 30 days supply | Qty: 180 | Fill #0

## 2018-05-26 ENCOUNTER — Other Ambulatory Visit: Payer: Self-pay | Admitting: Hematology & Oncology

## 2018-05-26 DIAGNOSIS — C7A8 Other malignant neuroendocrine tumors: Secondary | ICD-10-CM

## 2018-05-26 DIAGNOSIS — D500 Iron deficiency anemia secondary to blood loss (chronic): Secondary | ICD-10-CM

## 2018-05-26 MED FILL — BACLOFEN 20 MG TABLET: 20 | 15 days supply | Qty: 120 | Fill #0

## 2018-05-29 ENCOUNTER — Other Ambulatory Visit: Payer: Self-pay | Admitting: Hematology & Oncology

## 2018-05-29 DIAGNOSIS — K909 Intestinal malabsorption, unspecified: Secondary | ICD-10-CM

## 2018-05-29 DIAGNOSIS — C7A8 Other malignant neuroendocrine tumors: Secondary | ICD-10-CM

## 2018-05-29 DIAGNOSIS — N2889 Other specified disorders of kidney and ureter: Secondary | ICD-10-CM | POA: Diagnosis not present

## 2018-05-29 DIAGNOSIS — R197 Diarrhea, unspecified: Principal | ICD-10-CM

## 2018-05-29 MED FILL — DIPHENOXYLATE-ATROPINE 2.5-: 2.5-0.025 | 25 days supply | Qty: 100 | Fill #0

## 2018-05-31 ENCOUNTER — Inpatient Hospital Stay: Payer: 59 | Attending: Hematology & Oncology | Admitting: Hematology & Oncology

## 2018-05-31 ENCOUNTER — Inpatient Hospital Stay: Payer: 59

## 2018-05-31 ENCOUNTER — Encounter: Payer: Self-pay | Admitting: Hematology & Oncology

## 2018-05-31 ENCOUNTER — Other Ambulatory Visit: Payer: Self-pay

## 2018-05-31 VITALS — BP 90/61 | HR 88 | Temp 97.7°F | Resp 20

## 2018-05-31 DIAGNOSIS — D500 Iron deficiency anemia secondary to blood loss (chronic): Secondary | ICD-10-CM

## 2018-05-31 DIAGNOSIS — C7A8 Other malignant neuroendocrine tumors: Secondary | ICD-10-CM

## 2018-05-31 DIAGNOSIS — C641 Malignant neoplasm of right kidney, except renal pelvis: Secondary | ICD-10-CM | POA: Diagnosis not present

## 2018-05-31 DIAGNOSIS — C642 Malignant neoplasm of left kidney, except renal pelvis: Secondary | ICD-10-CM | POA: Diagnosis not present

## 2018-05-31 DIAGNOSIS — Q858 Other phakomatoses, not elsewhere classified: Secondary | ICD-10-CM

## 2018-05-31 DIAGNOSIS — Z79899 Other long term (current) drug therapy: Secondary | ICD-10-CM

## 2018-05-31 LAB — CBC WITH DIFFERENTIAL (CANCER CENTER ONLY)
Basophils Absolute: 0 10*3/uL (ref 0.0–0.1)
Basophils Relative: 0 %
EOS PCT: 1 %
Eosinophils Absolute: 0.1 10*3/uL (ref 0.0–0.5)
HCT: 44.8 % (ref 38.7–49.9)
Hemoglobin: 14.5 g/dL (ref 13.0–17.1)
LYMPHS ABS: 1.3 10*3/uL (ref 0.9–3.3)
LYMPHS PCT: 11 %
MCH: 28.3 pg (ref 28.0–33.4)
MCHC: 32.4 g/dL (ref 32.0–35.9)
MCV: 87.3 fL (ref 82.0–98.0)
MONO ABS: 0.9 10*3/uL (ref 0.1–0.9)
MONOS PCT: 8 %
Neutro Abs: 9.3 10*3/uL — ABNORMAL HIGH (ref 1.5–6.5)
Neutrophils Relative %: 80 %
PLATELETS: 221 10*3/uL (ref 145–400)
RBC: 5.13 MIL/uL (ref 4.20–5.70)
RDW: 13.2 % (ref 11.1–15.7)
WBC Count: 11.6 10*3/uL — ABNORMAL HIGH (ref 4.0–10.0)

## 2018-05-31 LAB — CMP (CANCER CENTER ONLY)
ALT: 23 U/L (ref 10–47)
AST: 24 U/L (ref 11–38)
Albumin: 3.1 g/dL — ABNORMAL LOW (ref 3.5–5.0)
Alkaline Phosphatase: 98 U/L — ABNORMAL HIGH (ref 26–84)
Anion gap: 5 (ref 5–15)
BUN: 12 mg/dL (ref 7–22)
CO2: 32 mmol/L (ref 18–33)
Calcium: 8.5 mg/dL (ref 8.0–10.3)
Chloride: 101 mmol/L (ref 98–108)
Creatinine: 0.8 mg/dL (ref 0.60–1.20)
GLUCOSE: 182 mg/dL — AB (ref 73–118)
POTASSIUM: 3.8 mmol/L (ref 3.3–4.7)
Sodium: 138 mmol/L (ref 128–145)
TOTAL PROTEIN: 6.7 g/dL (ref 6.4–8.1)
Total Bilirubin: 0.8 mg/dL (ref 0.2–1.6)

## 2018-05-31 MED ORDER — LANREOTIDE ACETATE 120 MG/0.5ML ~~LOC~~ SOLN
SUBCUTANEOUS | Status: AC
  Filled 2018-05-31: qty 120

## 2018-05-31 MED ORDER — LANREOTIDE ACETATE 120 MG/0.5ML ~~LOC~~ SOLN
120.0000 mg | Freq: Once | SUBCUTANEOUS | Status: AC
  Administered 2018-05-31: 120 mg via SUBCUTANEOUS

## 2018-05-31 MED ORDER — PANCRELIPASE (LIP-PROT-AMYL) 24000-76000 UNITS PO CPEP: 3.0000 | ORAL_CAPSULE | Freq: Three times a day (TID) | ORAL | 3 refills | Status: DC

## 2018-05-31 NOTE — Addendum Note (Signed)
Addended by: Burney Gauze R on: 05/31/2018 02:27 PM   Modules accepted: Orders

## 2018-05-31 NOTE — Progress Notes (Signed)
Hematology and Oncology Follow Up Visit  [name] [id number] 06-08-1961 57 y.o. 01/09/2018   Principle Diagnosis:   Metastatic neuroendocrine carcinoma  Von Hipple-Lindau Syndrome  Intermittent iron deficiency anemia secondary to GI bleeding.  Current Therapy:  Temodar/Xeloda - start 03/17/2016 - s/p c#3 - discontinued Somatuline 120mg  SQ q month Lutathera -- cycle #1/4 on 07/04/2018 IV iron as indicated-Feraheme given on 04/19/2018     Interim History:  Mr.  Beske is back for followup.  Unfortunately, he was seen at [facility] urology.  They did not feel that they could help him with the renal cell cancer.  They recommended that he go back to the [facility] and have surgery up there.  This is quite disappointing.  I really thought that [facility] would be able to help him out.  He will potentially start the Somatuline treatment on July 30.  This is when all the right materials for therapy are coming into place.  He is feeling okay.  His blood pressure has been doing all right.  His diabetes has been under pretty good control.  He has had no pain issues.  He has had no fever.  He does have a chronic indwelling Foley catheter.  When he gets infected, it is usually from the Foley catheter.  He has had no bleeding.  He has had some loose stool but this is chronic.  His iron studies back in May showed a ferritin of 248 with an iron saturation of 26%.  Overall, I said his performance status is ECOG 1.      Medications:  Current Outpatient Medications:  .  ACCU-CHEK SMARTVIEW test strip, , Disp: , Rfl: 10 .  baclofen (LIORESAL) 10 mg/mL SUSP, , Disp: , Rfl:  .  baclofen (LIORESAL) 20 MG tablet, , Disp: , Rfl:  .  Balsam Peru-Castor Oil (VENELEX) OINT, Apply topically as needed, Disp: 60 g, Rfl: 4 .  capecitabine (XELODA) 500 MG tablet, Take by mouth., Disp: , Rfl:  .  chlorproMAZINE (THORAZINE) 10 MG tablet, Take by mouth., Disp: , Rfl:  .  diazepam (VALIUM) 5 MG  tablet, TAKE 1 TABLET BY MOUTH 3 TIMES DAILY FOR SPASMS/ANXIETY, Disp: 90 tablet, Rfl: 1 .  diphenoxylate-atropine (LOMOTIL) 2.5-0.025 MG tablet, TAKE 1 TABLET BY MOUTH 4 TIMES DAILY AS NEEDED FOR DIARRHEA OR LOOSE STOOLS, Disp: 100 tablet, Rfl: 0 .  doxazosin (CARDURA) 1 MG tablet, Take 1 tablet (1 mg total) by mouth 2 (two) times daily. (Patient taking differently: Take 1 mg by mouth 2 (two) times daily as needed (blood pressure spikes.). ), Disp: 60 tablet, Rfl: 2 .  dronabinol (MARINOL) 5 MG capsule, TAKE 1 CAPSULE BY MOUTH TWICE DAILY WITH MEALS, Disp: 60 capsule, Rfl: 3 .  gabapentin (NEURONTIN) 300 MG capsule, TAKE 2 CAPSULES BY MOUTH 3 TIMES DAILY., Disp: 180 capsule, Rfl: 6 .  HYDROcodone-acetaminophen (NORCO/VICODIN) 5-325 MG tablet, Take 1-2 tablets by mouth every 6 (six) hours as needed for moderate pain., Disp: 90 tablet, Rfl: 0 .  HYDROmorphone (DILAUDID) 4 MG tablet, Take 1 tablet (4 mg total) by mouth every 6 (six) hours as needed for severe pain., Disp: 40 tablet, Rfl: 0 .  insulin glargine (LANTUS) 100 UNIT/ML injection, Inject 31 Units into the skin daily before breakfast. , Disp: , Rfl:  .  insulin lispro (HUMALOG) 100 UNIT/ML injection, Inject 3-9 Units into the skin 3 (three) times daily before meals. SS, Disp: , Rfl:  .  lipase/protease/amylase (CREON) 12000  units CPEP capsule, Take by mouth., Disp: , Rfl:  .  midodrine (PROAMATINE) 5 MG tablet, TKAE 1/2 TABLET BY MOUTH TWICE A DAY AS NEEDED, Disp: 60 tablet, Rfl: 6 .  NON FORMULARY, Take by mouth., Disp: , Rfl:  .  oxybutynin (DITROPAN) 5 MG tablet, 5 mg 2 (two) times daily. , Disp: , Rfl: 3 .  promethazine (PHENERGAN) 12.5 MG tablet, Take 12.5 mg by mouth every 6 (six) hours as needed for nausea. , Disp: , Rfl:  .  tiZANidine (ZANAFLEX) 4 MG tablet, TAKE 2 TABLETS BY MOUTH EVERY 6 HOURS AS NEEDED FOR PAIN, Disp: 30 tablet, Rfl: 3 .  traMADol (ULTRAM) 50 MG tablet, TAKE 1 TABLET BY MOUTH 3 TIMES DAILY, Disp: 90 tablet, Rfl:  2  Allergies:  Allergies  Allergen Reactions  . Citalopram Diarrhea  . Ciprocin-Fluocin-Procin [Fluocinolone Acetonide] Rash    Pt states this happened with IV Cipro. ABLE TO TAKE PO CIPRO.  . Other Rash    IV-CIPRO    Past Medical History, Surgical history, Social history, and Family History were reviewed and updated.  Review of Systems: Review of Systems  Constitutional: Negative.   HENT: Negative.   Eyes: Negative.   Respiratory: Negative.   Cardiovascular: Negative.   Gastrointestinal: Positive for diarrhea and nausea.  Genitourinary: Positive for frequency.  Musculoskeletal: Positive for myalgias and neck pain.  Skin: Negative.   Neurological: Negative.   Endo/Heme/Allergies: Negative.   Psychiatric/Behavioral: Negative.      Physical Exam:  oral temperature is 97.5 F (36.4 C) (abnormal). His blood pressure is 82/54 (abnormal) and his pulse is 94. His respiration is 18 and oxygen saturation is 99%.   Physical Exam  Constitutional: He is oriented to person, place, and time.  HENT:  Head: Normocephalic and atraumatic.  Mouth/Throat: Oropharynx is clear and moist.  Eyes: Pupils are equal, round, and reactive to light. EOM are normal.  Neck: Normal range of motion.  Cardiovascular: Normal rate, regular rhythm and normal heart sounds.  Pulmonary/Chest: Effort normal and breath sounds normal.  Abdominal: Soft. Bowel sounds are normal.  He has a suprapubic catheter in place  Musculoskeletal: Normal range of motion. He exhibits no edema, tenderness or deformity.  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  Neurological: He is alert and oriented to person, place, and time.  Skin: Skin is warm and dry. No rash noted. No erythema.  Psychiatric: He has a normal mood and affect. His behavior is normal. Judgment and thought content normal.  Vitals reviewed. .  Lab Results  Component Value Date   WBC 11.3 (H) 01/09/2018    HGB 13.8 12/05/2017   HCT 43.8 01/09/2018   MCV 86.4 01/09/2018   PLT 288 01/09/2018     Chemistry      Component Value Date/Time   NA 137 01/09/2018 1118   NA 137 12/05/2017 0840   NA 138 10/12/2016 0955   K 3.3 01/09/2018 1118   K 3.4 12/05/2017 0840   K 3.3 (L) 10/12/2016 0955   CL 104 01/09/2018 1118   CL 101 12/05/2017 0840   CO2 30 01/09/2018 1118   CO2 29 12/05/2017 0840   CO2 25 10/12/2016 0955   BUN 14 01/09/2018 1118   BUN 16 12/05/2017 0840   BUN 22.0 10/12/2016 0955   CREATININE 0.8 12/05/2017 0840   CREATININE 0.8 10/12/2016 0955      Component Value Date/Time   CALCIUM 8.8 01/09/2018  1118   CALCIUM 8.6 12/05/2017 0840   CALCIUM 9.0 10/12/2016 0955   ALKPHOS 101 (H) 01/09/2018 1118   ALKPHOS 139 (H) 12/05/2017 0840   ALKPHOS 159 (H) 10/12/2016 0955   AST 20 01/09/2018 1118   AST 45 (H) 10/12/2016 0955   ALT 12 01/09/2018 1118   ALT 19 12/05/2017 0840   ALT 29 10/12/2016 0955   BILITOT 0.8 01/09/2018 1118   BILITOT 0.62 10/12/2016 0955     Impression and Plan: Mr. Inks is 57 year old gentleman with von Hippel-Lindau syndrome. He has multiple neuroendocrine tumors. 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 He's had multiple spinal surgeries.  I am glad that at least the Lutathera will start.  This will definitely help with the neuroendocrine tumors.  If, for some reason, this does not help, we can utilize immunotherapy.  I am not sure when the renal cell will ever be taking care of.  I am not sure when he will be able to go up to [facility].  We will continue to follow him along as we are.  He will come back probably in 6 weeks now.  This will be after he has the [facility]ler.   Volanda Napoleon, MD 2/4/20196:20 PM

## 2018-06-01 ENCOUNTER — Encounter: Payer: Self-pay | Admitting: *Deleted

## 2018-06-01 LAB — IRON AND TIBC
IRON: 41 ug/dL — AB (ref 42–163)
Saturation Ratios: 18 % — ABNORMAL LOW (ref 42–163)
TIBC: 234 ug/dL (ref 202–409)
UIBC: 193 ug/dL

## 2018-06-01 LAB — FERRITIN: FERRITIN: 219 ng/mL (ref 24–336)

## 2018-06-02 LAB — CHROMOGRANIN A: Chromogranin A: 5 nmol/L (ref 0–5)

## 2018-06-05 ENCOUNTER — Encounter: Payer: Self-pay | Admitting: Hematology & Oncology

## 2018-06-05 MED FILL — SOLIFENACIN SUCCINATE 5 MG: 5 | 30 days supply | Qty: 30 | Fill #4

## 2018-06-06 ENCOUNTER — Encounter: Payer: Self-pay | Admitting: Hematology & Oncology

## 2018-06-07 ENCOUNTER — Inpatient Hospital Stay: Payer: 59 | Attending: Hematology & Oncology

## 2018-06-07 ENCOUNTER — Encounter: Payer: Self-pay | Admitting: Hematology & Oncology

## 2018-06-07 VITALS — BP 125/90 | HR 78 | Temp 97.9°F | Resp 19

## 2018-06-07 DIAGNOSIS — Q858 Other phakomatoses, not elsewhere classified: Secondary | ICD-10-CM | POA: Insufficient documentation

## 2018-06-07 DIAGNOSIS — C642 Malignant neoplasm of left kidney, except renal pelvis: Secondary | ICD-10-CM | POA: Insufficient documentation

## 2018-06-07 DIAGNOSIS — C7A8 Other malignant neuroendocrine tumors: Secondary | ICD-10-CM | POA: Insufficient documentation

## 2018-06-07 DIAGNOSIS — C641 Malignant neoplasm of right kidney, except renal pelvis: Secondary | ICD-10-CM | POA: Insufficient documentation

## 2018-06-07 DIAGNOSIS — Z79899 Other long term (current) drug therapy: Secondary | ICD-10-CM | POA: Diagnosis not present

## 2018-06-07 DIAGNOSIS — D500 Iron deficiency anemia secondary to blood loss (chronic): Secondary | ICD-10-CM

## 2018-06-07 MED ORDER — SODIUM CHLORIDE 0.9 % IV SOLN
510.0000 mg | Freq: Once | INTRAVENOUS | Status: AC
  Administered 2018-06-07: 510 mg via INTRAVENOUS
  Filled 2018-06-07: qty 17

## 2018-06-07 NOTE — Progress Notes (Signed)
Patient feels well, declined to stay for full 30 minute observation, needs to get home to sick family member.

## 2018-06-12 ENCOUNTER — Other Ambulatory Visit: Payer: Self-pay | Admitting: Hematology & Oncology

## 2018-06-12 DIAGNOSIS — C7A8 Other malignant neuroendocrine tumors: Secondary | ICD-10-CM

## 2018-06-12 DIAGNOSIS — D500 Iron deficiency anemia secondary to blood loss (chronic): Secondary | ICD-10-CM

## 2018-06-12 DIAGNOSIS — Q8583 Von Hippel-Lindau syndrome: Secondary | ICD-10-CM

## 2018-06-12 DIAGNOSIS — Q858 Other phakomatoses, not elsewhere classified: Secondary | ICD-10-CM

## 2018-06-12 DIAGNOSIS — N319 Neuromuscular dysfunction of bladder, unspecified: Secondary | ICD-10-CM

## 2018-06-12 MED FILL — traMADol HCL 50 MG TABS: 50 | 30 days supply | Qty: 90 | Fill #0

## 2018-06-12 MED FILL — CREON DR 24,000 UNITS CAP: 24000-76000 | 30 days supply | Qty: 270 | Fill #0

## 2018-06-19 ENCOUNTER — Other Ambulatory Visit: Payer: Self-pay | Admitting: Hematology & Oncology

## 2018-06-19 DIAGNOSIS — C7A8 Other malignant neuroendocrine tumors: Secondary | ICD-10-CM

## 2018-06-21 DIAGNOSIS — Q858 Other phakomatoses, not elsewhere classified: Secondary | ICD-10-CM | POA: Diagnosis not present

## 2018-06-21 DIAGNOSIS — H40021 Open angle with borderline findings, high risk, right eye: Secondary | ICD-10-CM | POA: Diagnosis not present

## 2018-06-21 MED FILL — DRONABINOL 5 MG CAP: 5 | 30 days supply | Qty: 60 | Fill #3

## 2018-06-21 MED FILL — LANTUS 100 UNITS/ML VIAL: 100 | 28 days supply | Qty: 10 | Fill #4

## 2018-06-23 MED FILL — BACLOFEN 20 MG TABLET: 20 | 15 days supply | Qty: 120 | Fill #1

## 2018-06-23 MED FILL — GABAPENTIN 300 MG CAPSULE: 300 | 30 days supply | Qty: 180 | Fill #1

## 2018-07-04 ENCOUNTER — Encounter (HOSPITAL_COMMUNITY)
Admission: RE | Admit: 2018-07-04 | Discharge: 2018-07-04 | Disposition: A | Discharge status: Home / Self Care | Payer: 59 | Source: Ambulatory Visit | Attending: Hematology & Oncology | Admitting: Hematology & Oncology

## 2018-07-04 DIAGNOSIS — C7A8 Other malignant neuroendocrine tumors: Secondary | ICD-10-CM | POA: Diagnosis not present

## 2018-07-04 DIAGNOSIS — C7A1 Malignant poorly differentiated neuroendocrine tumors: Secondary | ICD-10-CM | POA: Diagnosis not present

## 2018-07-04 LAB — CBC WITH DIFFERENTIAL/PLATELET
BASOS ABS: 0 10*3/uL (ref 0.0–0.1)
BASOS PCT: 0 %
EOS ABS: 0.1 10*3/uL (ref 0.0–0.7)
Eosinophils Relative: 1 %
HCT: 45.1 % (ref 39.0–52.0)
HEMOGLOBIN: 14.9 g/dL (ref 13.0–17.0)
Lymphocytes Relative: 15 %
Lymphs Abs: 1.3 10*3/uL (ref 0.7–4.0)
MCH: 29.2 pg (ref 26.0–34.0)
MCHC: 33 g/dL (ref 30.0–36.0)
MCV: 88.4 fL (ref 78.0–100.0)
MONO ABS: 0.8 10*3/uL (ref 0.1–1.0)
MONOS PCT: 9 %
NEUTROS PCT: 75 %
Neutro Abs: 6.3 10*3/uL (ref 1.7–7.7)
Platelets: 254 10*3/uL (ref 150–400)
RBC: 5.1 MIL/uL (ref 4.22–5.81)
RDW: 13.3 % (ref 11.5–15.5)
WBC: 8.6 10*3/uL (ref 4.0–10.5)

## 2018-07-04 LAB — COMPREHENSIVE METABOLIC PANEL
ALBUMIN: 3.2 g/dL — AB (ref 3.5–5.0)
ALT: 23 U/L (ref 0–44)
ANION GAP: 10 (ref 5–15)
AST: 31 U/L (ref 15–41)
Alkaline Phosphatase: 121 U/L (ref 38–126)
BUN: 19 mg/dL (ref 6–20)
CO2: 27 mmol/L (ref 22–32)
Calcium: 8.8 mg/dL — ABNORMAL LOW (ref 8.9–10.3)
Chloride: 102 mmol/L (ref 98–111)
Creatinine, Ser: 0.8 mg/dL (ref 0.61–1.24)
GFR calc Af Amer: >60 mL/min (ref >60)
GFR calc non Af Amer: >60 mL/min (ref >60)
GLUCOSE: 295 mg/dL — AB (ref 70–99)
POTASSIUM: 3.7 mmol/L (ref 3.5–5.1)
SODIUM: 139 mmol/L (ref 135–145)
Total Bilirubin: 0.8 mg/dL (ref 0.3–1.2)
Total Protein: 7.2 g/dL (ref 6.5–8.1)

## 2018-07-04 MED ORDER — OCTREOTIDE ACETATE 30 MG IM KIT
30.0000 mg | PACK | Freq: Once | INTRAMUSCULAR | Status: AC
  Administered 2018-07-04: 30 mg via INTRAMUSCULAR

## 2018-07-04 MED ORDER — SODIUM CHLORIDE 0.9 % IV SOLN: 500.0000 mL | Freq: Once | INTRAVENOUS | Status: DC

## 2018-07-04 MED ORDER — PROCHLORPERAZINE EDISYLATE 10 MG/2ML IJ SOLN
10.0000 mg | Freq: Four times a day (QID) | INTRAMUSCULAR | Status: DC | PRN
  Filled 2018-07-04: qty 2

## 2018-07-04 MED ORDER — OCTREOTIDE ACETATE 500 MCG/ML IJ SOLN: 500.0000 ug | Freq: Once | INTRAMUSCULAR | Status: DC | PRN

## 2018-07-04 MED ORDER — PROCHLORPERAZINE EDISYLATE 10 MG/2ML IJ SOLN
10.0000 mg | Freq: Four times a day (QID) | INTRAMUSCULAR | Status: DC | PRN
  Filled 2018-07-04 (×3): qty 2

## 2018-07-04 MED ORDER — OCTREOTIDE ACETATE 30 MG IM KIT: 30.0000 mg | PACK | Freq: Once | INTRAMUSCULAR | Status: DC

## 2018-07-04 MED ORDER — LUTETIUM LU 177 DOTATATE 370 MBQ/ML IV SOLN: 200.0000 | Freq: Once | INTRAVENOUS | Status: DC

## 2018-07-04 MED ORDER — ONDANSETRON HCL 8 MG PO TABS: 8.0000 mg | ORAL_TABLET | Freq: Two times a day (BID) | ORAL | 0 refills | Status: DC | PRN

## 2018-07-04 MED ORDER — SODIUM CHLORIDE 0.9 % IV SOLN
8.0000 mg | Freq: Once | INTRAVENOUS | Status: AC
  Administered 2018-07-04: 8 mg via INTRAVENOUS
  Filled 2018-07-04: qty 4

## 2018-07-04 MED ORDER — OCTREOTIDE ACETATE 500 MCG/ML IJ SOLN
INTRAMUSCULAR | Status: AC
Start: 2018-07-04 — End: ?
  Filled 2018-07-04: qty 1

## 2018-07-04 MED ORDER — SODIUM CHLORIDE 0.9 % IV SOLN
8.0000 mg | Freq: Once | INTRAVENOUS | Status: DC
  Filled 2018-07-04: qty 4

## 2018-07-04 MED ORDER — IBUPROFEN 400 MG PO TABS
400.0000 mg | ORAL_TABLET | ORAL | Status: AC
  Administered 2018-07-04: 400 mg via ORAL
  Filled 2018-07-04 (×2): qty 1

## 2018-07-04 MED ORDER — LUTETIUM LU 177 DOTATATE 370 MBQ/ML IV SOLN
200.0000 | Freq: Once | INTRAVENOUS | Status: AC
  Administered 2018-07-04: 205.5 via INTRAVENOUS

## 2018-07-04 MED ORDER — OCTREOTIDE ACETATE 30 MG IM KIT
PACK | INTRAMUSCULAR | Status: AC
Start: 2018-07-04 — End: ?
  Filled 2018-07-04: qty 1

## 2018-07-04 MED ORDER — AMINO ACID RADIOPROTECTANT - L-LYSINE 2.5%/L-ARGININE 2.5% IN NS
250.0000 mL/h | INTRAVENOUS | Status: AC
  Administered 2018-07-04: 250 mL/h via INTRAVENOUS
  Filled 2018-07-04: qty 1000

## 2018-07-04 MED ORDER — AMINO ACID RADIOPROTECTANT - L-LYSINE 2.5%/L-ARGININE 2.5% IN NS
250.0000 mL/h | INTRAVENOUS | Status: AC
  Filled 2018-07-04: qty 1000

## 2018-07-04 NOTE — Consult Note (Signed)
  RADIOPHARMACEUTICALS:  [205.5] mCi Lu 177 DOTATATE  FINDINGS: Diagnosis: [Metastatic neuroendocrine tumor.]  Current Infusion: [1]  Planned Infusions: [4]  The patient's most recent blood counts were reviewed and remains a good candidate to proceed with Lutathera.  Patient did have some blood in his Foley catheter which is attributed to Foley catheter manipulation potential bladder injury.  Patient was given 40) mg ibuprofen for bladder discomfort/spasm/irritation.   Risks and benefits of the procedure or reiterated with primary benefit being extended progression-free survival.  Major risk including mild suppression, hepatic toxicity, and renal toxicity.  Radiation safety precautions reviewed.  Consent signed.    The patient was situated in an infusionsuite and administered Lutathera as above Patient tolerated the procedure very well with no nausea or vomiting.  No carcinoid type symptoms.    Patient will follow-up with referring oncologist for interval serum laboratories (CBC and CMP) in approximately 4 weeks.  Patient can receive his standard Lanreotide/ SandostatinIMinjection at that time.  Since is the first therapy a short-term follow-up for laboratory value assessment (2 weeks) may be advised.  Recommend the Lanreotide  ejection 4 weeks from current week.    Patient received 30 mg IM long-acting Sandostatin injection 4 hours after Lutathera effusion in the nuclear medicine department.   IMPRESSION: [First] OI 712 [id number] treatment for metastatic neuroendocrine tumor. The patient tolerated the infusion well. The patient will return in 8 weeks for ongoing care.

## 2018-07-04 NOTE — ED Notes (Signed)
Therapy completed, patient tolerated well. Elevated BP noted but pt remains asymptomatic. The pt is administering his home medications as they are due.

## 2018-07-04 NOTE — ED Notes (Signed)
Patient is experiencing bladder spasms and discomfort. A verbal order for 400 mg of PO Ibuprofen was given by Dr. Leonia Reeves.

## 2018-07-04 NOTE — ED Notes (Signed)
Blood work collected and sent to the lab

## 2018-07-06 ENCOUNTER — Other Ambulatory Visit: Payer: Self-pay | Admitting: Hematology & Oncology

## 2018-07-06 DIAGNOSIS — R197 Diarrhea, unspecified: Principal | ICD-10-CM

## 2018-07-06 DIAGNOSIS — K909 Intestinal malabsorption, unspecified: Secondary | ICD-10-CM

## 2018-07-06 DIAGNOSIS — C7A8 Other malignant neuroendocrine tumors: Secondary | ICD-10-CM

## 2018-07-06 MED FILL — SOLIFENACIN SUCCINATE 5 MG: 5 | 30 days supply | Qty: 30 | Fill #5

## 2018-07-06 MED FILL — DIPHENOXYLATE-ATROPINE 2.5-: 2.5-0.025 | 25 days supply | Qty: 100 | Fill #0

## 2018-07-11 MED FILL — traMADol HCL 50 MG TABS: 50 | 30 days supply | Qty: 90 | Fill #1

## 2018-07-11 MED FILL — CREON DR 24,000 UNITS CAP: 24000-76000 | 30 days supply | Qty: 270 | Fill #1

## 2018-07-12 ENCOUNTER — Telehealth: Payer: Self-pay | Admitting: *Deleted

## 2018-07-12 ENCOUNTER — Inpatient Hospital Stay: Payer: 59

## 2018-07-12 ENCOUNTER — Inpatient Hospital Stay: Payer: 59 | Admitting: Hematology & Oncology

## 2018-07-12 NOTE — Telephone Encounter (Signed)
Received call from patient's wife. Patient is experiencing severe muscle spasms and vomiting this am. This is made worse with any sort of movement, so she would like to cancel appointment and reschedule for another time.   Message sent to scheduler to reschedule appointment.

## 2018-07-14 ENCOUNTER — Telehealth: Payer: Self-pay

## 2018-07-14 NOTE — Telephone Encounter (Signed)
Received notification from [facility], pharmacist that pt is scheduled for lab/MD/injection next week 8/16. Patient received Sandostatin with Lutathera treatment on 7/30 so will not be due for Somatuline on 8/16.   Per Dr Marin Olp, pt to be r/s to 8/30 for all appts. Note to [employer]. dph

## 2018-07-21 ENCOUNTER — Other Ambulatory Visit: Payer: 59

## 2018-07-21 ENCOUNTER — Ambulatory Visit: Payer: 59 | Admitting: Hematology & Oncology

## 2018-07-21 ENCOUNTER — Ambulatory Visit: Payer: 59

## 2018-07-24 ENCOUNTER — Other Ambulatory Visit: Payer: Self-pay | Admitting: Hematology & Oncology

## 2018-07-24 MED FILL — LANTUS 100 UNITS/ML VIAL: 100 | 28 days supply | Qty: 10 | Fill #5

## 2018-07-24 MED FILL — GABAPENTIN 300 MG CAPSULE: 300 | 30 days supply | Qty: 180 | Fill #2

## 2018-07-24 MED FILL — DRONABINOL 5 MG CAP: 5 | 30 days supply | Qty: 60 | Fill #0

## 2018-08-04 ENCOUNTER — Encounter: Payer: Self-pay | Admitting: Hematology & Oncology

## 2018-08-04 ENCOUNTER — Inpatient Hospital Stay (HOSPITAL_BASED_OUTPATIENT_CLINIC_OR_DEPARTMENT_OTHER): Payer: 59 | Admitting: Hematology & Oncology

## 2018-08-04 ENCOUNTER — Other Ambulatory Visit: Payer: Self-pay

## 2018-08-04 ENCOUNTER — Inpatient Hospital Stay: Payer: 59 | Attending: Hematology & Oncology

## 2018-08-04 ENCOUNTER — Inpatient Hospital Stay: Payer: 59

## 2018-08-04 DIAGNOSIS — C641 Malignant neoplasm of right kidney, except renal pelvis: Secondary | ICD-10-CM | POA: Insufficient documentation

## 2018-08-04 DIAGNOSIS — Z79899 Other long term (current) drug therapy: Secondary | ICD-10-CM | POA: Insufficient documentation

## 2018-08-04 DIAGNOSIS — C642 Malignant neoplasm of left kidney, except renal pelvis: Secondary | ICD-10-CM | POA: Insufficient documentation

## 2018-08-04 DIAGNOSIS — C7A8 Other malignant neuroendocrine tumors: Secondary | ICD-10-CM

## 2018-08-04 DIAGNOSIS — T83510S Infection and inflammatory reaction due to cystostomy catheter, sequela: Secondary | ICD-10-CM

## 2018-08-04 DIAGNOSIS — Z23 Encounter for immunization: Secondary | ICD-10-CM

## 2018-08-04 DIAGNOSIS — N319 Neuromuscular dysfunction of bladder, unspecified: Secondary | ICD-10-CM

## 2018-08-04 DIAGNOSIS — D500 Iron deficiency anemia secondary to blood loss (chronic): Secondary | ICD-10-CM

## 2018-08-04 DIAGNOSIS — Q858 Other phakomatoses, not elsewhere classified: Secondary | ICD-10-CM | POA: Insufficient documentation

## 2018-08-04 DIAGNOSIS — Z905 Acquired absence of kidney: Secondary | ICD-10-CM

## 2018-08-04 DIAGNOSIS — N390 Urinary tract infection, site not specified: Secondary | ICD-10-CM

## 2018-08-04 DIAGNOSIS — D696 Thrombocytopenia, unspecified: Secondary | ICD-10-CM | POA: Diagnosis not present

## 2018-08-04 LAB — CBC WITH DIFFERENTIAL (CANCER CENTER ONLY)
Basophils Absolute: 0 10*3/uL (ref 0.0–0.1)
Basophils Relative: 0 %
EOS ABS: 0.1 10*3/uL (ref 0.0–0.5)
EOS PCT: 2 %
HCT: 48.6 % (ref 38.7–49.9)
Hemoglobin: 15.9 g/dL (ref 13.0–17.1)
LYMPHS ABS: 1 10*3/uL (ref 0.9–3.3)
Lymphocytes Relative: 14 %
MCH: 29.2 pg (ref 28.0–33.4)
MCHC: 32.7 g/dL (ref 32.0–35.9)
MCV: 89.2 fL (ref 82.0–98.0)
MONO ABS: 0.6 10*3/uL (ref 0.1–0.9)
MONOS PCT: 8 %
Neutro Abs: 5.6 10*3/uL (ref 1.5–6.5)
Neutrophils Relative %: 76 %
Platelet Count: 93 10*3/uL — ABNORMAL LOW (ref 145–400)
RBC: 5.45 MIL/uL (ref 4.20–5.70)
RDW: 13.6 % (ref 11.1–15.7)
WBC Count: 7.3 10*3/uL (ref 4.0–10.0)

## 2018-08-04 LAB — CMP (CANCER CENTER ONLY)
ALBUMIN: 3.3 g/dL — AB (ref 3.5–5.0)
ALT: 35 U/L (ref 10–47)
AST: 39 U/L — ABNORMAL HIGH (ref 11–38)
Alkaline Phosphatase: 111 U/L — ABNORMAL HIGH (ref 26–84)
Anion gap: 7 (ref 5–15)
BUN: 17 mg/dL (ref 7–22)
CHLORIDE: 97 mmol/L — AB (ref 98–108)
CO2: 32 mmol/L (ref 18–33)
CREATININE: 0.5 mg/dL — AB (ref 0.60–1.20)
Calcium: 9.5 mg/dL (ref 8.0–10.3)
Glucose, Bld: 125 mg/dL — ABNORMAL HIGH (ref 73–118)
POTASSIUM: 4.5 mmol/L (ref 3.3–4.7)
SODIUM: 136 mmol/L (ref 128–145)
Total Bilirubin: 0.8 mg/dL (ref 0.2–1.6)
Total Protein: 7.8 g/dL (ref 6.4–8.1)

## 2018-08-04 MED ORDER — SODIUM CHLORIDE 0.9% FLUSH
3.0000 mL | Freq: Once | INTRAVENOUS | Status: DC | PRN
  Filled 2018-08-04: qty 10

## 2018-08-04 MED ORDER — LANREOTIDE ACETATE 120 MG/0.5ML ~~LOC~~ SOLN: 120.0000 mg | Freq: Once | SUBCUTANEOUS | Status: DC

## 2018-08-04 MED ORDER — OCTREOTIDE ACETATE 30 MG IM KIT
30.0000 mg | PACK | Freq: Once | INTRAMUSCULAR | Status: AC
  Administered 2018-08-04: 30 mg via INTRAMUSCULAR
  Filled 2018-08-04: qty 1

## 2018-08-04 MED ORDER — LANREOTIDE ACETATE 120 MG/0.5ML ~~LOC~~ SOLN
SUBCUTANEOUS | Status: AC
  Filled 2018-08-04: qty 120

## 2018-08-04 MED ORDER — HYDROMORPHONE HCL 4 MG PO TABS: 4.0000 mg | ORAL_TABLET | Freq: Four times a day (QID) | ORAL | 0 refills | Status: DC | PRN

## 2018-08-04 MED ORDER — ALTEPLASE 2 MG IJ SOLR
2.0000 mg | Freq: Once | INTRAMUSCULAR | Status: DC | PRN
  Filled 2018-08-04: qty 2

## 2018-08-04 MED ORDER — HEPARIN SOD (PORK) LOCK FLUSH 100 UNIT/ML IV SOLN
500.0000 [IU] | Freq: Once | INTRAVENOUS | Status: DC | PRN
  Filled 2018-08-04: qty 5

## 2018-08-04 MED ORDER — HEPARIN SOD (PORK) LOCK FLUSH 100 UNIT/ML IV SOLN
250.0000 [IU] | Freq: Once | INTRAVENOUS | Status: DC | PRN
  Filled 2018-08-04: qty 5

## 2018-08-04 MED ORDER — SODIUM CHLORIDE 0.9% FLUSH
10.0000 mL | INTRAVENOUS | Status: DC | PRN
  Filled 2018-08-04: qty 10

## 2018-08-04 MED ORDER — HYDROCODONE-ACETAMINOPHEN 5-325 MG PO TABS: 1.0000 | ORAL_TABLET | Freq: Four times a day (QID) | ORAL | 0 refills | Status: DC | PRN

## 2018-08-04 MED FILL — HYDROCODON-APAP 5-325: 5-325 | 11 days supply | Qty: 90 | Fill #0

## 2018-08-04 MED FILL — HYDROmorphone HCL 4 MG TABS: 4 | 10 days supply | Qty: 40 | Fill #0

## 2018-08-04 NOTE — Progress Notes (Signed)
Hematology and Oncology Follow Up Visit  [name] [id number] Aug 14, 1961 57 y.o. 01/09/2018   Principle Diagnosis:   Metastatic neuroendocrine carcinoma  Von Hipple-Lindau Syndrome  Intermittent iron deficiency anemia secondary to GI bleeding.  Current Therapy:  Temodar/Xeloda - start 03/17/2016 - s/p c#3 - discontinued Somatuline 120mg  SQ q month Lutathera -- cycle #1/4 on 07/04/2018 IV iron as indicated-Feraheme given on 04/19/2018     Interim History:  Mr.  Couts is back for followup.  He seems to have had a tough time with the first Lutathera dose.  He says that he did not eat well.  He said he did not feel well.  Is been about a month since he had treatment.  He is going up to [facility] in a couple weeks.  This is to be evaluated for the kidney resection.  From what he says, they may have to think about a radical nephrectomy.  He has overall status looks a little bit better in my opinion.  He has decent albumin.  His blood pressure is quite high.  I am not sure why this is.  I will have to recheck his blood pressure.  He has had no problems with his diabetes.  Again he says is not eating all that well.  He has had more pain issues.  We will go ahead and refill the hydrocodone and the hydromorphone.  These have helped in the past.  He has had no fever.  He has had no change with his bowels.  Currently, his performance status is ECOG 1.    Medications:  Current Outpatient Medications:  .  ACCU-CHEK SMARTVIEW test strip, , Disp: , Rfl: 10 .  baclofen (LIORESAL) 10 mg/mL SUSP, , Disp: , Rfl:  .  baclofen (LIORESAL) 20 MG tablet, , Disp: , Rfl:  .  Balsam [place]-Castor Oil (VENELEX) OINT, Apply topically as needed, Disp: 60 g, Rfl: 4 .  capecitabine (XELODA) 500 MG tablet, Take by mouth., Disp: , Rfl:  .  chlorproMAZINE (THORAZINE) 10 MG tablet, Take by mouth., Disp: , Rfl:  .  diazepam (VALIUM) 5 MG tablet, TAKE 1 TABLET BY MOUTH 3 TIMES DAILY FOR SPASMS/ANXIETY, Disp:  90 tablet, Rfl: 1 .  diphenoxylate-atropine (LOMOTIL) 2.5-0.025 MG tablet, TAKE 1 TABLET BY MOUTH 4 TIMES DAILY AS NEEDED FOR DIARRHEA OR LOOSE STOOLS, Disp: 100 tablet, Rfl: 0 .  doxazosin (CARDURA) 1 MG tablet, Take 1 tablet (1 mg total) by mouth 2 (two) times daily. (Patient taking differently: Take 1 mg by mouth 2 (two) times daily as needed (blood pressure spikes.). ), Disp: 60 tablet, Rfl: 2 .  dronabinol (MARINOL) 5 MG capsule, TAKE 1 CAPSULE BY MOUTH TWICE DAILY WITH MEALS, Disp: 60 capsule, Rfl: 3 .  gabapentin (NEURONTIN) 300 MG capsule, TAKE 2 CAPSULES BY MOUTH 3 TIMES DAILY., Disp: 180 capsule, Rfl: 6 .  HYDROcodone-acetaminophen (NORCO/VICODIN) 5-325 MG tablet, Take 1-2 tablets by mouth every 6 (six) hours as needed for moderate pain., Disp: 90 tablet, Rfl: 0 .  HYDROmorphone (DILAUDID) 4 MG tablet, Take 1 tablet (4 mg total) by mouth every 6 (six) hours as needed for severe pain., Disp: 40 tablet, Rfl: 0 .  insulin glargine (LANTUS) 100 UNIT/ML injection, Inject 31 Units into the skin daily before breakfast. , Disp: , Rfl:  .  insulin lispro (HUMALOG) 100 UNIT/ML injection, Inject 3-9 Units into the skin 3 (three) times daily before meals. SS, Disp: , Rfl:  .  lipase/protease/amylase (CREON) 12000 units CPEP capsule,  Take by mouth., Disp: , Rfl:  .  midodrine (PROAMATINE) 5 MG tablet, TKAE 1/2 TABLET BY MOUTH TWICE A DAY AS NEEDED, Disp: 60 tablet, Rfl: 6 .  NON FORMULARY, Take by mouth., Disp: , Rfl:  .  oxybutynin (DITROPAN) 5 MG tablet, 5 mg 2 (two) times daily. , Disp: , Rfl: 3 .  promethazine (PHENERGAN) 12.5 MG tablet, Take 12.5 mg by mouth every 6 (six) hours as needed for nausea. , Disp: , Rfl:  .  tiZANidine (ZANAFLEX) 4 MG tablet, TAKE 2 TABLETS BY MOUTH EVERY 6 HOURS AS NEEDED FOR PAIN, Disp: 30 tablet, Rfl: 3 .  traMADol (ULTRAM) 50 MG tablet, TAKE 1 TABLET BY MOUTH 3 TIMES DAILY, Disp: 90 tablet, Rfl: 2  Allergies:  Allergies  Allergen Reactions  . Citalopram Diarrhea    . Ciprocin-Fluocin-Procin [Fluocinolone Acetonide] Rash    Pt states this happened with IV Cipro. ABLE TO TAKE PO CIPRO.  . Other Rash    IV-CIPRO    Past Medical History, Surgical history, Social history, and Family History were reviewed and updated.  Review of Systems: Review of Systems  Constitutional: Negative.   HENT: Negative.   Eyes: Negative.   Respiratory: Negative.   Cardiovascular: Negative.   Gastrointestinal: Positive for diarrhea and nausea.  Genitourinary: Positive for frequency.  Musculoskeletal: Positive for myalgias and neck pain.  Skin: Negative.   Neurological: Negative.   Endo/Heme/Allergies: Negative.   Psychiatric/Behavioral: Negative.      Physical Exam:  oral temperature is 97.5 F (36.4 C) (abnormal). His blood pressure is 82/54 (abnormal) and his pulse is 94. His respiration is 18 and oxygen saturation is 99%.   Physical Exam  Constitutional: He is oriented to person, place, and time.  HENT:  Head: Normocephalic and atraumatic.  Mouth/Throat: Oropharynx is clear and moist.  Eyes: Pupils are equal, round, and reactive to light. EOM are normal.  Neck: Normal range of motion.  Cardiovascular: Normal rate, regular rhythm and normal heart sounds.  Pulmonary/Chest: Effort normal and breath sounds normal.  Abdominal: Soft. Bowel sounds are normal.  He has a suprapubic catheter in place  Musculoskeletal: Normal range of motion. He exhibits no edema, tenderness or deformity.  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  Neurological: He is alert and oriented to person, place, and time.  Skin: Skin is warm and dry. No rash noted. No erythema.  Psychiatric: He has a normal mood and affect. His behavior is normal. Judgment and thought content normal.  Vitals reviewed. .  Lab Results  Component Value Date   WBC 11.3 (H) 01/09/2018   HGB 13.8 12/05/2017   HCT 43.8 01/09/2018   MCV 86.4 01/09/2018   PLT  288 01/09/2018     Chemistry      Component Value Date/Time   NA 137 01/09/2018 1118   NA 137 12/05/2017 0840   NA 138 10/12/2016 0955   K 3.3 01/09/2018 1118   K 3.4 12/05/2017 0840   K 3.3 (L) 10/12/2016 0955   CL 104 01/09/2018 1118   CL 101 12/05/2017 0840   CO2 30 01/09/2018 1118   CO2 29 12/05/2017 0840   CO2 25 10/12/2016 0955   BUN 14 01/09/2018 1118   BUN 16 12/05/2017 0840   BUN 22.0 10/12/2016 0955   CREATININE 0.8 12/05/2017 0840   CREATININE 0.8 10/12/2016 0955      Component Value Date/Time   CALCIUM 8.8 01/09/2018 1118  CALCIUM 8.6 12/05/2017 0840   CALCIUM 9.0 10/12/2016 0955   ALKPHOS 101 (H) 01/09/2018 1118   ALKPHOS 139 (H) 12/05/2017 0840   ALKPHOS 159 (H) 10/12/2016 0955   AST 20 01/09/2018 1118   AST 45 (H) 10/12/2016 0955   ALT 12 01/09/2018 1118   ALT 19 12/05/2017 0840   ALT 29 10/12/2016 0955   BILITOT 0.8 01/09/2018 1118   BILITOT 0.62 10/12/2016 0955     Impression and Plan: Mr. Allcock is 57 year old gentleman with von Hippel-Lindau syndrome. He has multiple neuroendocrine tumors. 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 He's had multiple spinal surgeries.  I am little surprised that the Lutathera has caused problems for him.  I am not sure as to why it would.  I know that some people have had a few problems for a week or so afterwards.  His thrombocytopenia certainly could be reflective of the Lutathera.  It will be incredibly interesting to see what [facility] feels about the kidney cancer and how to deal with that.  I will plan to see him back in another month.  I think one question is whether or not we need to hold on the next Lutathera until we see about his kidney cancer and surgery for this.    Volanda Napoleon, MD 2/4/20196:20 PM

## 2018-08-08 LAB — IRON AND TIBC
Iron: 68 ug/dL (ref 42–163)
Saturation Ratios: 24 % — ABNORMAL LOW (ref 42–163)
TIBC: 281 ug/dL (ref 202–409)
UIBC: 213 ug/dL

## 2018-08-08 LAB — CHROMOGRANIN A: Chromogranin A: 6 nmol/L — ABNORMAL HIGH (ref 0–5)

## 2018-08-08 LAB — FERRITIN: Ferritin: 393 ng/mL — ABNORMAL HIGH (ref 24–336)

## 2018-08-08 IMAGING — CT CT ABD-PELV W/ CM
2 of 5 series · 12 of 36 positions shown, 15 images · IV contrast (iopamidol)
Comparison: 11/01/2017

CLINICAL DATA: Metastatic neuroendocrine tumor.  [facility].

EXAM:
CT CHEST, ABDOMEN, AND PELVIS WITH CONTRAST
TECHNIQUE: Multidetector CT imaging of the chest, abdomen and pelvis was
performed following the standard protocol during bolus
administration of intravenous contrast.
CONTRAST:  100mL GDRTCV-LBB IOPAMIDOL (GDRTCV-LBB) INJECTION 61%

[Series 2: cap with 2 · axial · 0.89mm/px · z∈[-358,+272]mm · 9 of 152 slices shown, 12 images]
[im 13/152  mediastinal]
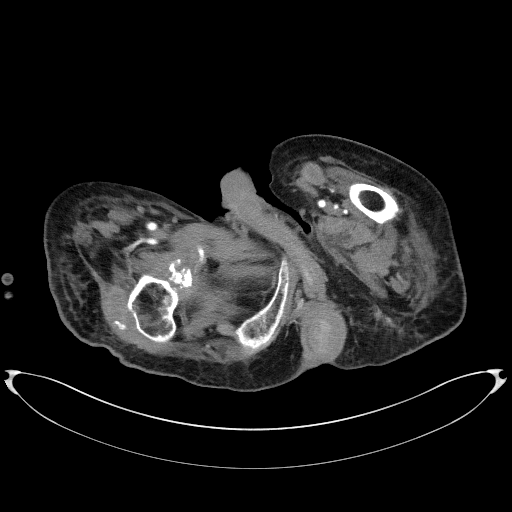
[im 13/152  lung]
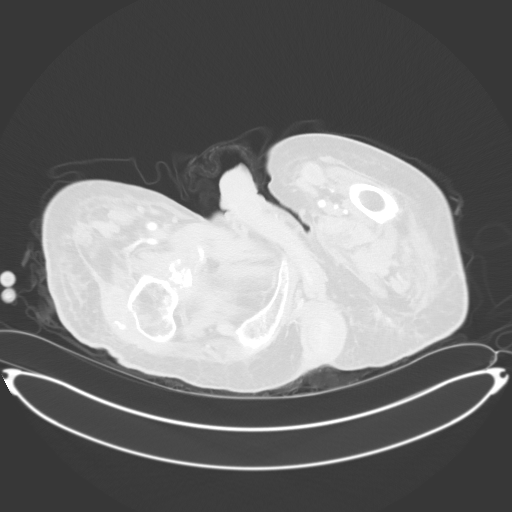
[im 26/152  lung]
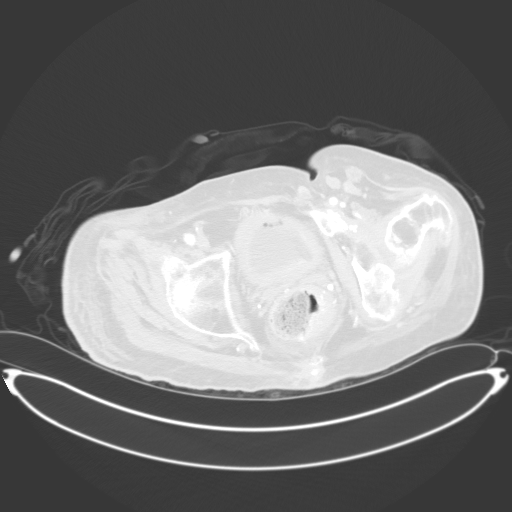
[im 51/152  lung]
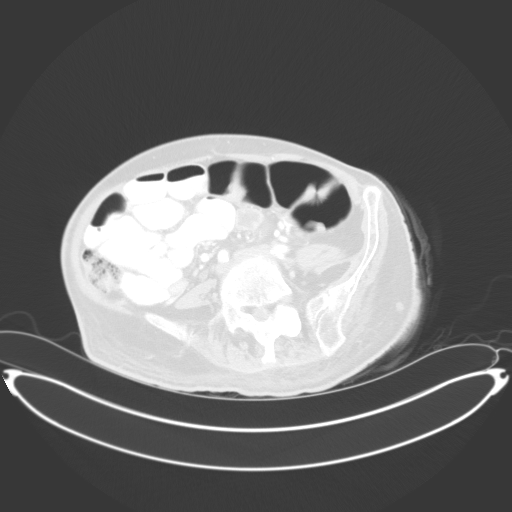
[im 63/152  lung]
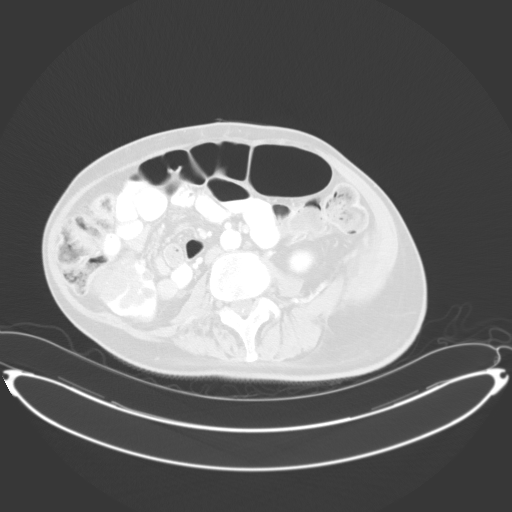
[im 76/152  mediastinal]
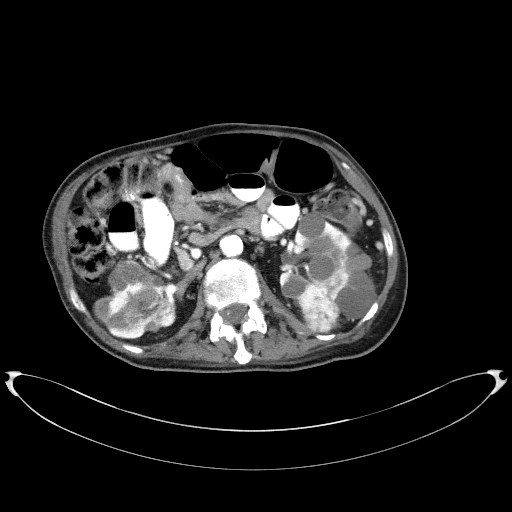
[im 76/152  lung]
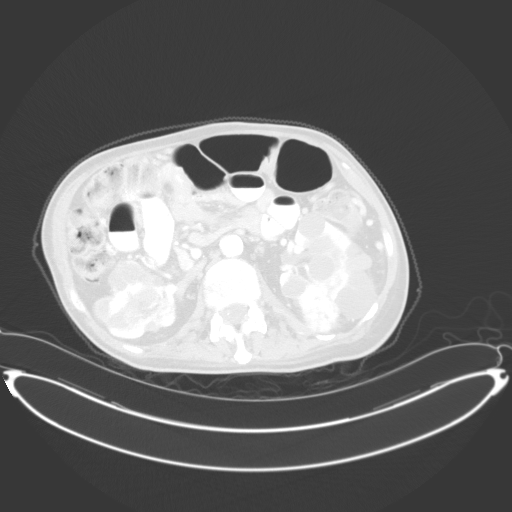
[im 89/152  lung]
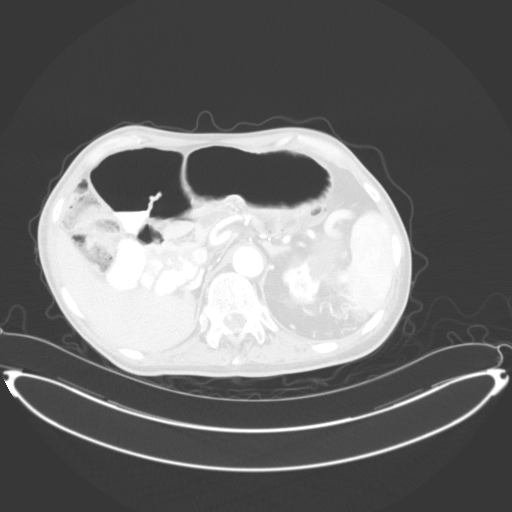
[im 101/152  lung]
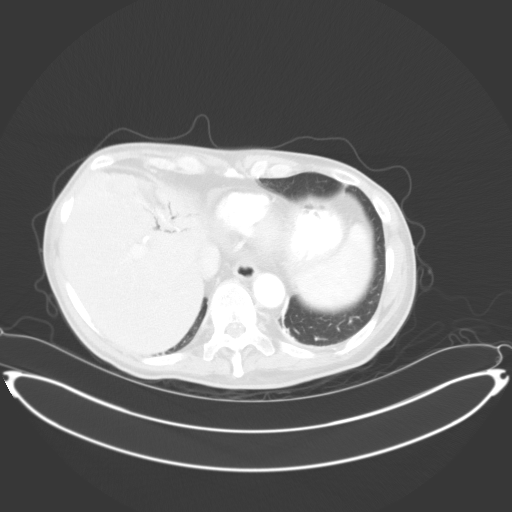
[im 126/152  lung]
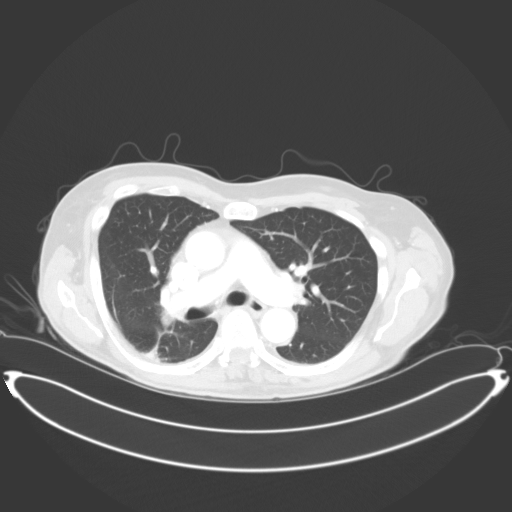
[im 139/152  mediastinal]
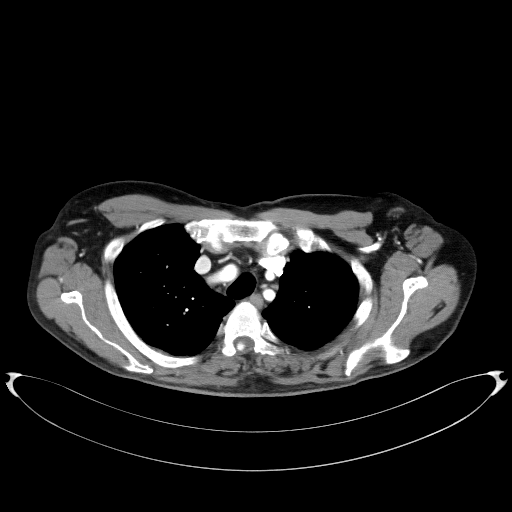
[im 139/152  lung]
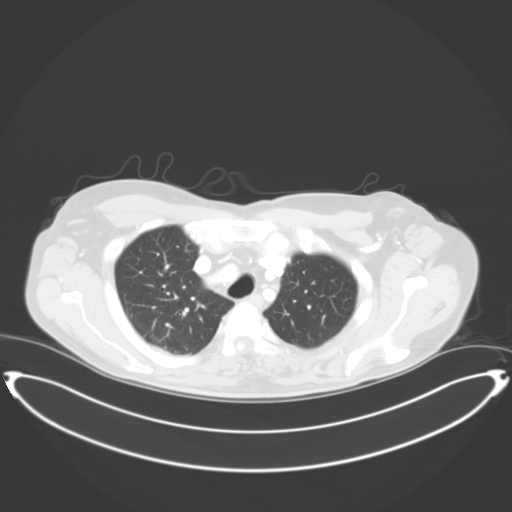

[Series 4: coronals · coronal · 0.93mm/px · 3 of 133 slices shown]
[im 27/133  lung]
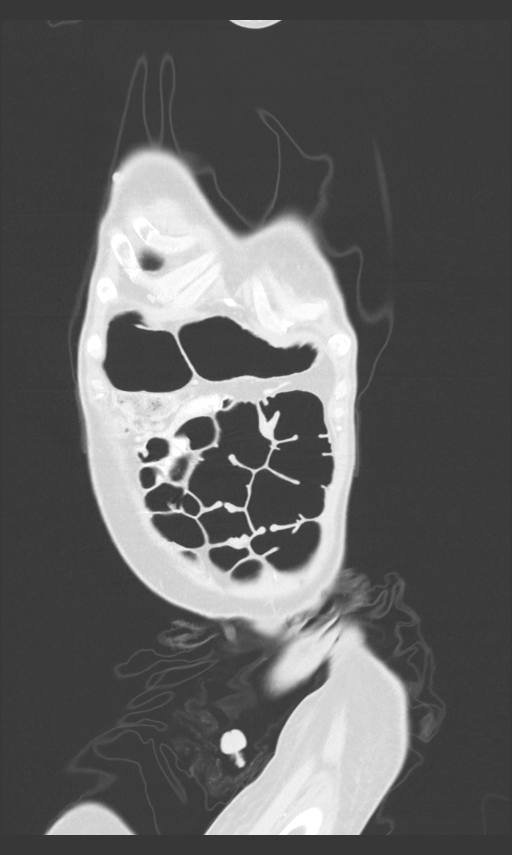
[im 53/133  lung]
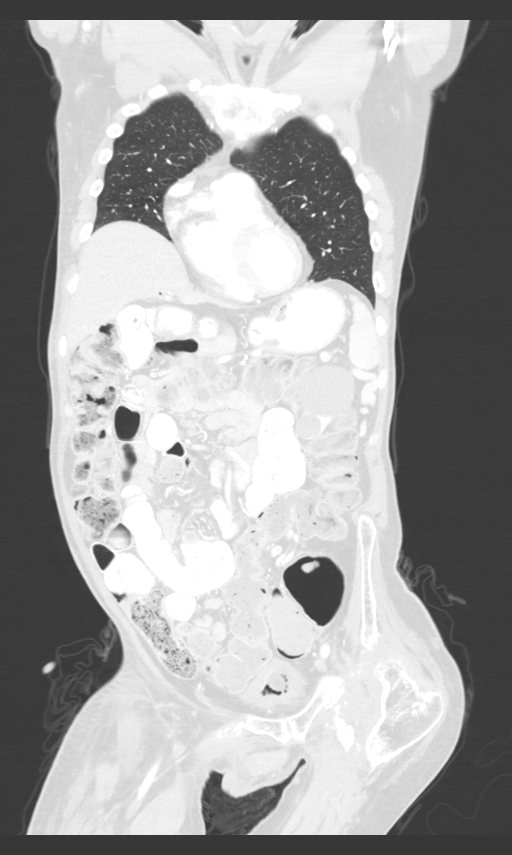
[im 80/133  lung]
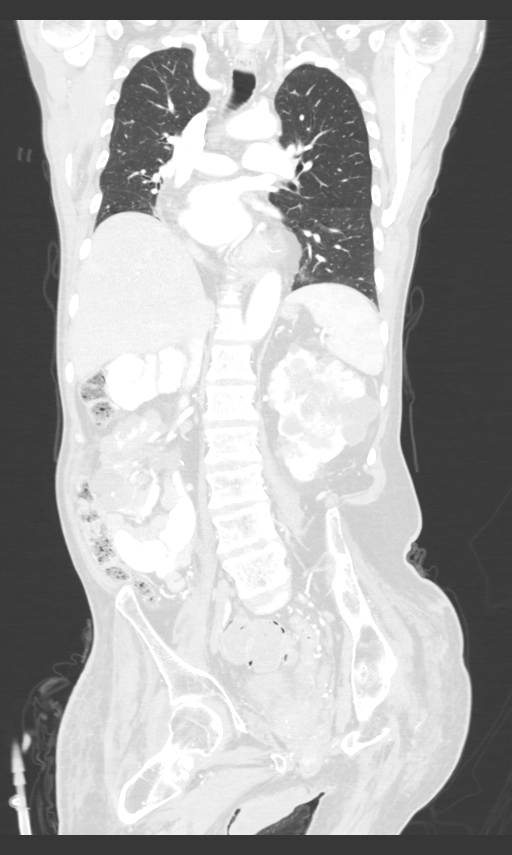

[12 of 36 positions shown; findings below may reference images not displayed]

FINDINGS: CT CHEST FINDINGS

Cardiovascular: Heart is normal in size.  No pericardial effusion.

No evidence of thoracic aortic aneurysm.

Mediastinum/Nodes: No suspicious mediastinal, hilar, or axillary
lymphadenopathy.

Visualized thyroid is unremarkable.

Lungs/Pleura: Scarring/atelectasis in the right lower lobe, with
associated volume loss.

No suspicious pulmonary nodules.

No focal consolidation.

Trace bilateral pleural effusions, new.

No pneumothorax.

Musculoskeletal: Prior posterior laminectomy at multiple midthoracic
levels.

Multiple enhancing lesions within the spinal cord, measuring up to
12 mm at T12 (series 2/image 68), compatible with stable
hemangioblastomas.

CT ABDOMEN PELVIS FINDINGS

Hepatobiliary: Progressive hepatic metastases. For example, index
enhancing lesion in the anterior hepatic dome measures 2.0 x 2.4 cm
(series 2/image 49), previously 1.5 x 1.9 cm. Mixed enhancing lesion
in the lateral patent dome measures 3.1 x 2.3 cm (series 2/image
43), previously 2.6 x 2.3 cm. Hypoenhancing lesion in the central
right liver measures 1.9 x 1.5 cm (series 2/image 46), unchanged.

Status post cholecystectomy. No intrahepatic or extrahepatic duct
dilatation.

Pancreas: Status post pancreatectomy.

Spleen: Within normal limits.

Adrenals/Urinary Tract: Adrenal glands are within normal limits.

Multiple bilateral renal cysts of varying sizes and complexity.
Multiple enhancing bilateral renal masses, compatible with renal
cell carcinomas, some of which are progressive on the left. Dominant
5.3 x 4.1 cm enhancing mass along the posterior left lower kidney
(series 2/image 34), previously 4.8 x 4.0 cm. Dominant 4.0 x 3.6 cm
enhancing mass along the lateral left upper kidney (series 2/image
68), previously 3.8 x 3.5 cm.

No hydronephrosis.

Bladder is underdistended with a suprapubic catheter, irregular wall
thickening, and nondependent gas.

Stomach/Bowel: Stomach is within normal limits.

No evidence of bowel obstruction.

Moderate left colonic stool burden.

Again noted is irregular eccentric left rectal wall thickening
(series 2/image 129). Rectal neoplasm is not excluded.

Vascular/Lymphatic: No evidence of abdominal aortic aneurysm.

Atherosclerotic calcifications of the abdominal aorta and branch
vessels.

Small upper abdominal/retroperitoneal lymph nodes.

Reproductive: Prostate is grossly unremarkable.

Other: No abdominopelvic ascites.

Musculoskeletal: Fusion of the bilateral SI joints. Myositis
ossificans along the anterior right thigh (and to a lesser extent on
the left). Degenerative changes of the bilateral hips. Multiple
subcutaneous nodules overlying the left gluteal region (for example,
series 2/image 111), unchanged.
IMPRESSION: Progressive hepatic metastases, with index lesions as above.

Progressive left renal cell carcinomas, as above. Additional solid
and complex masses are present bilaterally.

Persistent irregular left rectal wall thickening. Flexible
sigmoidoscopy is suggested to exclude rectal neoplasm.

Stable spinal hemangioblastomas.

Additional ancillary findings as above.

## 2018-08-08 MED FILL — FREESTYLE LITE TEST STRIP: 90 days supply | Qty: 400 | Fill #1

## 2018-08-09 ENCOUNTER — Other Ambulatory Visit: Payer: Self-pay | Admitting: Hematology & Oncology

## 2018-08-09 DIAGNOSIS — K909 Intestinal malabsorption, unspecified: Secondary | ICD-10-CM

## 2018-08-09 DIAGNOSIS — C7A8 Other malignant neuroendocrine tumors: Secondary | ICD-10-CM

## 2018-08-09 DIAGNOSIS — R197 Diarrhea, unspecified: Principal | ICD-10-CM

## 2018-08-09 MED FILL — DIPHENOXYLATE-ATROPINE 2.5-: 2.5-0.025 | 25 days supply | Qty: 100 | Fill #0

## 2018-08-09 MED FILL — SOLIFENACIN SUCCINATE 5 MG: 5 | 30 days supply | Qty: 30 | Fill #0

## 2018-08-09 MED FILL — traMADol HCL 50 MG TABS: 50 | 30 days supply | Qty: 90 | Fill #2

## 2018-08-09 MED FILL — CREON DR 24,000 UNITS CAP: 24000-76000 | 30 days supply | Qty: 270 | Fill #2

## 2018-08-25 MED FILL — LANTUS 100 UNITS/ML VIAL: 100 | 28 days supply | Qty: 10 | Fill #6

## 2018-08-25 MED FILL — GABAPENTIN 300 MG CAPSULE: 300 | 30 days supply | Qty: 180 | Fill #3

## 2018-08-25 MED FILL — DRONABINOL 5 MG CAP: 5 | 30 days supply | Qty: 60 | Fill #1

## 2018-08-29 ENCOUNTER — Ambulatory Visit (HOSPITAL_COMMUNITY): Payer: 59

## 2018-09-06 ENCOUNTER — Encounter: Payer: Self-pay | Admitting: Hematology & Oncology

## 2018-09-06 ENCOUNTER — Other Ambulatory Visit: Payer: Self-pay

## 2018-09-06 ENCOUNTER — Inpatient Hospital Stay: Payer: 59 | Attending: Hematology & Oncology

## 2018-09-06 ENCOUNTER — Inpatient Hospital Stay (HOSPITAL_BASED_OUTPATIENT_CLINIC_OR_DEPARTMENT_OTHER): Payer: 59 | Admitting: Hematology & Oncology

## 2018-09-06 ENCOUNTER — Inpatient Hospital Stay: Payer: 59

## 2018-09-06 VITALS — BP 147/113 | HR 85 | Temp 97.8°F | Resp 20 | Wt 150.8 lb

## 2018-09-06 DIAGNOSIS — D500 Iron deficiency anemia secondary to blood loss (chronic): Secondary | ICD-10-CM | POA: Diagnosis not present

## 2018-09-06 DIAGNOSIS — Q858 Other phakomatoses, not elsewhere classified: Secondary | ICD-10-CM

## 2018-09-06 DIAGNOSIS — C7A8 Other malignant neuroendocrine tumors: Secondary | ICD-10-CM

## 2018-09-06 DIAGNOSIS — Z79899 Other long term (current) drug therapy: Secondary | ICD-10-CM

## 2018-09-06 DIAGNOSIS — Z905 Acquired absence of kidney: Secondary | ICD-10-CM | POA: Insufficient documentation

## 2018-09-06 DIAGNOSIS — D696 Thrombocytopenia, unspecified: Secondary | ICD-10-CM | POA: Insufficient documentation

## 2018-09-06 DIAGNOSIS — T83510S Infection and inflammatory reaction due to cystostomy catheter, sequela: Secondary | ICD-10-CM

## 2018-09-06 DIAGNOSIS — C641 Malignant neoplasm of right kidney, except renal pelvis: Secondary | ICD-10-CM

## 2018-09-06 DIAGNOSIS — C642 Malignant neoplasm of left kidney, except renal pelvis: Secondary | ICD-10-CM | POA: Diagnosis not present

## 2018-09-06 DIAGNOSIS — N390 Urinary tract infection, site not specified: Secondary | ICD-10-CM

## 2018-09-06 DIAGNOSIS — K922 Gastrointestinal hemorrhage, unspecified: Secondary | ICD-10-CM | POA: Insufficient documentation

## 2018-09-06 DIAGNOSIS — Q8583 Von Hippel-Lindau syndrome: Secondary | ICD-10-CM

## 2018-09-06 DIAGNOSIS — Z23 Encounter for immunization: Secondary | ICD-10-CM

## 2018-09-06 DIAGNOSIS — N319 Neuromuscular dysfunction of bladder, unspecified: Secondary | ICD-10-CM

## 2018-09-06 LAB — CBC WITH DIFFERENTIAL (CANCER CENTER ONLY)
BASOS ABS: 0 10*3/uL (ref 0.0–0.1)
BASOS PCT: 0 %
EOS ABS: 0.1 10*3/uL (ref 0.0–0.5)
Eosinophils Relative: 2 %
HCT: 39.4 % (ref 38.7–49.9)
Hemoglobin: 12.5 g/dL — ABNORMAL LOW (ref 13.0–17.1)
LYMPHS ABS: 1 10*3/uL (ref 0.9–3.3)
Lymphocytes Relative: 11 %
MCH: 29.6 pg (ref 28.0–33.4)
MCHC: 31.7 g/dL — ABNORMAL LOW (ref 32.0–35.9)
MCV: 93.4 fL (ref 82.0–98.0)
MONO ABS: 0.9 10*3/uL (ref 0.1–0.9)
Monocytes Relative: 10 %
Neutro Abs: 6.8 10*3/uL — ABNORMAL HIGH (ref 1.5–6.5)
Neutrophils Relative %: 77 %
PLATELETS: 285 10*3/uL (ref 145–400)
RBC: 4.22 MIL/uL (ref 4.20–5.70)
RDW: 14.1 % (ref 11.1–15.7)
WBC Count: 8.8 10*3/uL (ref 4.0–10.0)

## 2018-09-06 LAB — CMP (CANCER CENTER ONLY)
ALBUMIN: 2.8 g/dL — AB (ref 3.5–5.0)
ALT: 26 U/L (ref 10–47)
AST: 30 U/L (ref 11–38)
Alkaline Phosphatase: 113 U/L — ABNORMAL HIGH (ref 26–84)
Anion gap: 2 — ABNORMAL LOW (ref 5–15)
BUN: 17 mg/dL (ref 7–22)
CHLORIDE: 102 mmol/L (ref 98–108)
CO2: 30 mmol/L (ref 18–33)
Calcium: 8.6 mg/dL (ref 8.0–10.3)
Creatinine: 0.6 mg/dL (ref 0.60–1.20)
Glucose, Bld: 229 mg/dL — ABNORMAL HIGH (ref 73–118)
POTASSIUM: 4.2 mmol/L (ref 3.3–4.7)
Sodium: 134 mmol/L (ref 128–145)
Total Bilirubin: 0.6 mg/dL (ref 0.2–1.6)
Total Protein: 6.6 g/dL (ref 6.4–8.1)

## 2018-09-06 MED ORDER — LANREOTIDE ACETATE 120 MG/0.5ML ~~LOC~~ SOLN
SUBCUTANEOUS | Status: AC
  Filled 2018-09-06: qty 120

## 2018-09-06 MED ORDER — OCTREOTIDE ACETATE 30 MG IM KIT
30.0000 mg | PACK | Freq: Once | INTRAMUSCULAR | Status: DC
  Filled 2018-09-06: qty 1

## 2018-09-06 MED ORDER — LANREOTIDE ACETATE 120 MG/0.5ML ~~LOC~~ SOLN
120.0000 mg | Freq: Once | SUBCUTANEOUS | Status: AC
  Administered 2018-09-06: 120 mg via SUBCUTANEOUS

## 2018-09-06 NOTE — Patient Instructions (Signed)
Lanreotide injection What is this medicine? LANREOTIDE (lan REE oh tide) is used to reduce blood levels of growth hormone in patients with a condition called acromegaly. It also works to slow or stop tumor growth in patients with neuroendocrine tumors and treat carcinoid syndrome. This medicine may be used for other purposes; ask your health care provider or pharmacist if you have questions. COMMON BRAND NAME(S): Somatuline [place] What should I tell my health care provider before I take this medicine? They need to know if you have any of these conditions: -diabetes -gallbladder disease -heart disease -kidney disease -liver disease -thyroid disease -an unusual or allergic reaction to lanreotide, other medicines, foods, dyes, or preservatives -pregnant or trying to get pregnant -breast-feeding How should I use this medicine? This medicine is for injection under the skin. It is given by a health care professional in a hospital or clinic setting. Contact your pediatrician or health care professional regarding the use of this medicine in children. Special care may be needed. Overdosage: If you think you have taken too much of this medicine contact a poison control center or emergency room at once. NOTE: This medicine is only for you. Do not share this medicine with others. What if I miss a dose? It is important not to miss your dose. Call your doctor or health care professional if you are unable to keep an appointment. What may interact with this medicine? This medicine may interact with the following medications: -bromocriptine -cyclosporine -certain medicines for blood pressure, heart disease, irregular heart beat -certain medicines for diabetes -quinidine -terfenadine This list may not describe all possible interactions. Give your health care provider a list of all the medicines, herbs, non-prescription drugs, or dietary supplements you use. Also tell them if you smoke, drink alcohol, or  use illegal drugs. Some items may interact with your medicine. What should I watch for while using this medicine? Tell your doctor or healthcare professional if your symptoms do not start to get better or if they get worse. Visit your doctor or health care professional for regular checks on your progress. Your condition will be monitored carefully while you are receiving this medicine. You may need blood work done while you are taking this medicine. Women should inform their doctor if they wish to become pregnant or think they might be pregnant. There is a potential for serious side effects to an unborn child. Talk to your health care professional or pharmacist for more information. Do not breast-feed an infant while taking this medicine or for 6 months after stopping it. This medicine has caused ovarian failure in some women. This medicine may interfere with the ability to have a child. Talk with your doctor or health care professional if you are concerned about your fertility. What side effects may I notice from receiving this medicine? Side effects that you should report to your doctor or health care professional as soon as possible: -allergic reactions like skin rash, itching or hives, swelling of the face, lips, or tongue -increased blood pressure -severe stomach pain -signs and symptoms of high blood sugar such as dizziness; dry mouth; dry skin; fruity breath; nausea; stomach pain; increased hunger or thirst; increased urination -signs and symptoms of low blood sugar such as feeling anxious; confusion; dizziness; increased hunger; unusually weak or tired; sweating; shakiness; cold; irritable; headache; blurred vision; fast heartbeat; loss of consciousness -unusually slow heartbeat Side effects that usually do not require medical attention (report to your doctor or health care professional if they continue   or are bothersome): -constipation -diarrhea -dizziness -headache -muscle pain -muscle  spasms -nausea -pain, redness, or irritation at site where injected This list may not describe all possible side effects. Call your doctor for medical advice about side effects. You may report side effects to FDA at 1-800-FDA-1088. Where should I keep my medicine? This drug is given in a hospital or clinic and will not be stored at home. NOTE: This sheet is a summary. It may not cover all possible information. If you have questions about this medicine, talk to your doctor, pharmacist, or health care provider.  2018 Elsevier/Gold Standard (2016-08-27 10:33:47)  

## 2018-09-06 NOTE — Progress Notes (Signed)
Hematology and Oncology Follow Up Visit  [name] [id number] 25-Jun-1961 57 y.o. 01/09/2018   Principle Diagnosis:   Metastatic neuroendocrine carcinoma  Von Hipple-Lindau Syndrome  Intermittent iron deficiency anemia secondary to GI bleeding.  Current Therapy:  Temodar/Xeloda - start 03/17/2016 - s/p c#3 - discontinued Somatuline 120mg  SQ q month Lutathera -- cycle #1/4 on 07/04/2018 IV iron as indicated-Feraheme given on 04/19/2018     Interim History:  [name] is back for followup.  He looks amazing.  He just got back from [facility].  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  He actually is feeling better.  He has a bit better functioning of his hands.  He does not have as much neuropathy.  As far as his kidney cancer is concerned, the [facility] is not sure when they will address at this.  I would like to think that it probably will be within the next 2 months.  Apparently he did have a CT scan done up there.  It did show that there is some decrease in his liver metastasis.  He has received 1 dose of Lutathera.  The second dose is going to be on hold for right now until after his kidney cancer surgery.  His blood sugars and blood pressure have been a little bit fluctuating.  This is not a surprise.  He has had no bleeding.  He has had no fever.  He has had no problems with the suprapubic catheter that he has for his bladder.  There is been no UTI.  I think we actually gave him some iron before he went back up to [facility].  His iron saturation was 24%.  Currently, his performance status is ECOG 1.    Medications:  Current Outpatient Medications:  .  ACCU-CHEK SMARTVIEW test strip, , Disp: , Rfl: 10 .  baclofen (LIORESAL) 10 mg/mL SUSP, , Disp: , Rfl:  .  baclofen (LIORESAL) 20 MG tablet, , Disp: , Rfl:  .  Balsam Peru-Castor Oil (VENELEX) OINT, Apply topically as needed, Disp: 60 g, Rfl: 4 .  capecitabine (XELODA) 500 MG tablet, Take by mouth.,  Disp: , Rfl:  .  chlorproMAZINE (THORAZINE) 10 MG tablet, Take by mouth., Disp: , Rfl:  .  diazepam (VALIUM) 5 MG tablet, TAKE 1 TABLET BY MOUTH 3 TIMES DAILY FOR SPASMS/ANXIETY, Disp: 90 tablet, Rfl: 1 .  diphenoxylate-atropine (LOMOTIL) 2.5-0.025 MG tablet, TAKE 1 TABLET BY MOUTH 4 TIMES DAILY AS NEEDED FOR DIARRHEA OR LOOSE STOOLS, Disp: 100 tablet, Rfl: 0 .  doxazosin (CARDURA) 1 MG tablet, Take 1 tablet (1 mg total) by mouth 2 (two) times daily. (Patient taking differently: Take 1 mg by mouth 2 (two) times daily as needed (blood pressure spikes.). ), Disp: 60 tablet, Rfl: 2 .  dronabinol (MARINOL) 5 MG capsule, TAKE 1 CAPSULE BY MOUTH TWICE DAILY WITH MEALS, Disp: 60 capsule, Rfl: 3 .  gabapentin (NEURONTIN) 300 MG capsule, TAKE 2 CAPSULES BY MOUTH 3 TIMES DAILY., Disp: 180 capsule, Rfl: 6 .  HYDROcodone-acetaminophen (NORCO/VICODIN) 5-325 MG tablet, Take 1-2 tablets by mouth every 6 (six) hours as needed for moderate pain., Disp: 90 tablet, Rfl: 0 .  HYDROmorphone (DILAUDID) 4 MG tablet, Take 1 tablet (4 mg total) by mouth every 6 (six) hours as needed for severe pain., Disp: 40 tablet, Rfl: 0 .  insulin glargine (LANTUS) 100 UNIT/ML injection, Inject 31 Units into the skin daily before breakfast. , Disp: , Rfl:  .  insulin lispro (HUMALOG) 100 UNIT/ML injection, Inject 3-9 Units into the skin 3 (three) times daily before meals. SS, Disp: , Rfl:  .  lipase/protease/amylase (CREON) 12000 units CPEP capsule, Take by mouth., Disp: , Rfl:  .  midodrine (PROAMATINE) 5 MG tablet, TKAE 1/2 TABLET BY MOUTH TWICE A DAY AS NEEDED, Disp: 60 tablet, Rfl: 6 .  NON FORMULARY, Take by mouth., Disp: , Rfl:  .  oxybutynin (DITROPAN) 5 MG tablet, 5 mg 2 (two) times daily. , Disp: , Rfl: 3 .  promethazine (PHENERGAN) 12.5 MG tablet, Take 12.5 mg by mouth every 6 (six) hours as needed for nausea. , Disp: , Rfl:  .  tiZANidine (ZANAFLEX) 4 MG tablet, TAKE 2 TABLETS BY MOUTH EVERY 6 HOURS AS NEEDED FOR PAIN, Disp:  30 tablet, Rfl: 3 .  traMADol (ULTRAM) 50 MG tablet, TAKE 1 TABLET BY MOUTH 3 TIMES DAILY, Disp: 90 tablet, Rfl: 2  Allergies:  Allergies  Allergen Reactions  . Citalopram Diarrhea  . Ciprocin-Fluocin-Procin [Fluocinolone Acetonide] Rash    Pt states this happened with IV Cipro. ABLE TO TAKE PO CIPRO.  . Other Rash    IV-CIPRO    Past Medical History, Surgical history, Social history, and Family History were reviewed and updated.  Review of Systems: Review of Systems  Constitutional: Negative.   HENT: Negative.   Eyes: Negative.   Respiratory: Negative.   Cardiovascular: Negative.   Gastrointestinal: Positive for diarrhea and nausea.  Genitourinary: Positive for frequency.  Musculoskeletal: Positive for myalgias and neck pain.  Skin: Negative.   Neurological: Negative.   Endo/Heme/Allergies: Negative.   Psychiatric/Behavioral: Negative.      Physical Exam:  oral temperature is 97.5 F (36.4 C) (abnormal). His blood pressure is 82/54 (abnormal) and his pulse is 94. His respiration is 18 and oxygen saturation is 99%.   Physical Exam  Constitutional: He is oriented to person, place, and time.  HENT:  Head: Normocephalic and atraumatic.  Mouth/Throat: Oropharynx is clear and moist.  Eyes: Pupils are equal, round, and reactive to light. EOM are normal.  Neck: Normal range of motion.  Cardiovascular: Normal rate, regular rhythm and normal heart sounds.  Pulmonary/Chest: Effort normal and breath sounds normal.  Abdominal: Soft. Bowel sounds are normal.  He has a suprapubic catheter in place  Musculoskeletal: Normal range of motion. He exhibits no edema, tenderness or deformity.  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  Neurological: He is alert and oriented to person, place, and time.  Skin: Skin is warm and dry. No rash noted. No erythema.  Psychiatric: He has a normal mood and affect. His behavior is normal. Judgment and  thought content normal.  Vitals reviewed. .  Lab Results  Component Value Date   WBC 11.3 (H) 01/09/2018   HGB 13.8 12/05/2017   HCT 43.8 01/09/2018   MCV 86.4 01/09/2018   PLT 288 01/09/2018     Chemistry      Component Value Date/Time   NA 137 01/09/2018 1118   NA 137 12/05/2017 0840   NA 138 10/12/2016 0955   K 3.3 01/09/2018 1118   K 3.4 12/05/2017 0840   K 3.3 (L) 10/12/2016 0955   CL 104 01/09/2018 1118   CL 101 12/05/2017 0840   CO2 30 01/09/2018 1118   CO2 29 12/05/2017 0840   CO2 25 10/12/2016 0955   BUN 14 01/09/2018 1118   BUN 16 12/05/2017 0840   BUN  22.0 10/12/2016 0955   CREATININE 0.8 12/05/2017 0840   CREATININE 0.8 10/12/2016 0955      Component Value Date/Time   CALCIUM 8.8 01/09/2018 1118   CALCIUM 8.6 12/05/2017 0840   CALCIUM 9.0 10/12/2016 0955   ALKPHOS 101 (H) 01/09/2018 1118   ALKPHOS 139 (H) 12/05/2017 0840   ALKPHOS 159 (H) 10/12/2016 0955   AST 20 01/09/2018 1118   AST 45 (H) 10/12/2016 0955   ALT 12 01/09/2018 1118   ALT 19 12/05/2017 0840   ALT 29 10/12/2016 0955   BILITOT 0.8 01/09/2018 1118   BILITOT 0.62 10/12/2016 0955     Impression and Plan: Mr. Hlavaty is 57 year old gentleman with von Hippel-Lindau syndrome. He has multiple neuroendocrine tumors. 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 He's had multiple spinal surgeries.  Again, I am just very happy that he is doing so well.  It sounds like everything really went smoothly up at the [facility].  Hopefully, he will be back up there before Christmas for his kidney cancer surgery.  We will continue him on the Somatuline.  I will have him come back in another month.  Volanda Napoleon, MD 2/4/20196:20 PM

## 2018-09-06 NOTE — Addendum Note (Signed)
Addended by: Burney Gauze R on: 09/06/2018 02:30 PM   Modules accepted: Orders

## 2018-09-06 NOTE — Progress Notes (Signed)
[name] is currently on hold. Patient will receive Somatuline today instead of Sandostatin per Dr. Marin Olp.

## 2018-09-07 ENCOUNTER — Other Ambulatory Visit: Payer: 59

## 2018-09-07 ENCOUNTER — Other Ambulatory Visit: Payer: Self-pay | Admitting: Hematology & Oncology

## 2018-09-07 ENCOUNTER — Ambulatory Visit: Payer: 59

## 2018-09-07 ENCOUNTER — Ambulatory Visit: Payer: 59 | Admitting: Hematology & Oncology

## 2018-09-07 DIAGNOSIS — R197 Diarrhea, unspecified: Principal | ICD-10-CM

## 2018-09-07 DIAGNOSIS — K909 Intestinal malabsorption, unspecified: Secondary | ICD-10-CM

## 2018-09-07 DIAGNOSIS — C7A8 Other malignant neuroendocrine tumors: Secondary | ICD-10-CM

## 2018-09-07 LAB — CHROMOGRANIN A: CHROMOGRANIN A: 5 nmol/L (ref 0–5)

## 2018-09-07 MED FILL — BACLOFEN 20 MG TABLET: 20 | 15 days supply | Qty: 120 | Fill #2

## 2018-09-07 MED FILL — CREON DR 24,000 UNITS CAP: 24000-76000 | 30 days supply | Qty: 270 | Fill #3

## 2018-09-07 MED FILL — DIPHENOXYLATE-ATROPINE 2.5-: 2.5-0.025 | 25 days supply | Qty: 100 | Fill #0

## 2018-09-08 ENCOUNTER — Other Ambulatory Visit: Payer: Self-pay | Admitting: Hematology & Oncology

## 2018-09-08 DIAGNOSIS — Q8583 Von Hippel-Lindau syndrome: Secondary | ICD-10-CM

## 2018-09-08 DIAGNOSIS — C7A8 Other malignant neuroendocrine tumors: Secondary | ICD-10-CM

## 2018-09-08 DIAGNOSIS — N319 Neuromuscular dysfunction of bladder, unspecified: Secondary | ICD-10-CM

## 2018-09-08 DIAGNOSIS — D500 Iron deficiency anemia secondary to blood loss (chronic): Secondary | ICD-10-CM

## 2018-09-08 DIAGNOSIS — Q858 Other phakomatoses, not elsewhere classified: Secondary | ICD-10-CM

## 2018-09-08 MED FILL — traMADol HCL 50 MG TABS: 50 | 30 days supply | Qty: 90 | Fill #0

## 2018-09-13 MED FILL — SOLIFENACIN SUCCINATE 5 MG: 5 | 30 days supply | Qty: 30 | Fill #1

## 2018-09-26 MED FILL — GABAPENTIN 300 MG CAPSULE: 300 | 30 days supply | Qty: 180 | Fill #4

## 2018-09-27 MED FILL — DRONABINOL 5 MG CAP: 5 | 30 days supply | Qty: 60 | Fill #2

## 2018-10-11 ENCOUNTER — Other Ambulatory Visit: Payer: Self-pay

## 2018-10-11 ENCOUNTER — Inpatient Hospital Stay: Payer: 59 | Attending: Hematology & Oncology | Admitting: Hematology & Oncology

## 2018-10-11 ENCOUNTER — Encounter: Payer: Self-pay | Admitting: Hematology & Oncology

## 2018-10-11 ENCOUNTER — Other Ambulatory Visit: Payer: Self-pay | Admitting: Hematology & Oncology

## 2018-10-11 ENCOUNTER — Inpatient Hospital Stay: Payer: 59

## 2018-10-11 VITALS — BP 103/47 | HR 98 | Temp 97.5°F | Resp 20 | Wt 150.8 lb

## 2018-10-11 DIAGNOSIS — C641 Malignant neoplasm of right kidney, except renal pelvis: Secondary | ICD-10-CM

## 2018-10-11 DIAGNOSIS — Z79899 Other long term (current) drug therapy: Secondary | ICD-10-CM

## 2018-10-11 DIAGNOSIS — R197 Diarrhea, unspecified: Principal | ICD-10-CM

## 2018-10-11 DIAGNOSIS — D500 Iron deficiency anemia secondary to blood loss (chronic): Secondary | ICD-10-CM | POA: Diagnosis not present

## 2018-10-11 DIAGNOSIS — C642 Malignant neoplasm of left kidney, except renal pelvis: Secondary | ICD-10-CM | POA: Diagnosis not present

## 2018-10-11 DIAGNOSIS — Q858 Other phakomatoses, not elsewhere classified: Secondary | ICD-10-CM | POA: Diagnosis not present

## 2018-10-11 DIAGNOSIS — C7A8 Other malignant neuroendocrine tumors: Secondary | ICD-10-CM | POA: Diagnosis not present

## 2018-10-11 DIAGNOSIS — K909 Intestinal malabsorption, unspecified: Secondary | ICD-10-CM

## 2018-10-11 DIAGNOSIS — D696 Thrombocytopenia, unspecified: Secondary | ICD-10-CM | POA: Diagnosis not present

## 2018-10-11 DIAGNOSIS — Z905 Acquired absence of kidney: Secondary | ICD-10-CM

## 2018-10-11 DIAGNOSIS — K922 Gastrointestinal hemorrhage, unspecified: Secondary | ICD-10-CM

## 2018-10-11 DIAGNOSIS — M623 Immobility syndrome (paraplegic): Secondary | ICD-10-CM

## 2018-10-11 LAB — CBC WITH DIFFERENTIAL (CANCER CENTER ONLY)
ABS IMMATURE GRANULOCYTES: 0.06 10*3/uL (ref 0.00–0.07)
BASOS PCT: 0 %
Basophils Absolute: 0 10*3/uL (ref 0.0–0.1)
EOS ABS: 0.1 10*3/uL (ref 0.0–0.5)
Eosinophils Relative: 0 %
HCT: 45.5 % (ref 39.0–52.0)
Hemoglobin: 13.9 g/dL (ref 13.0–17.0)
IMMATURE GRANULOCYTES: 1 %
Lymphocytes Relative: 6 %
Lymphs Abs: 0.7 10*3/uL (ref 0.7–4.0)
MCH: 28 pg (ref 26.0–34.0)
MCHC: 30.5 g/dL (ref 30.0–36.0)
MCV: 91.5 fL (ref 80.0–100.0)
MONOS PCT: 6 %
Monocytes Absolute: 0.8 10*3/uL (ref 0.1–1.0)
NEUTROS ABS: 10.5 10*3/uL — AB (ref 1.7–7.7)
NEUTROS PCT: 87 %
Platelet Count: 214 10*3/uL (ref 150–400)
RBC: 4.97 MIL/uL (ref 4.22–5.81)
RDW: 12.2 % (ref 11.5–15.5)
WBC: 12.1 10*3/uL — AB (ref 4.0–10.5)
nRBC: 0 % (ref 0.0–0.2)

## 2018-10-11 LAB — CMP (CANCER CENTER ONLY)
ALT: 25 U/L (ref 10–47)
ANION GAP: 6 (ref 5–15)
AST: 41 U/L — ABNORMAL HIGH (ref 11–38)
Albumin: 3.3 g/dL — ABNORMAL LOW (ref 3.5–5.0)
Alkaline Phosphatase: 124 U/L — ABNORMAL HIGH (ref 26–84)
BUN: 15 mg/dL (ref 7–22)
CO2: 33 mmol/L (ref 18–33)
Calcium: 9.2 mg/dL (ref 8.0–10.3)
Chloride: 98 mmol/L (ref 98–108)
Creatinine: 0.6 mg/dL (ref 0.60–1.20)
Glucose, Bld: 103 mg/dL (ref 73–118)
POTASSIUM: 3.6 mmol/L (ref 3.3–4.7)
SODIUM: 137 mmol/L (ref 128–145)
Total Bilirubin: 0.8 mg/dL (ref 0.2–1.6)
Total Protein: 6.8 g/dL (ref 6.4–8.1)

## 2018-10-11 MED ORDER — LANREOTIDE ACETATE 120 MG/0.5ML ~~LOC~~ SOLN
SUBCUTANEOUS | Status: AC
  Filled 2018-10-11: qty 120

## 2018-10-11 MED ORDER — LANREOTIDE ACETATE 120 MG/0.5ML ~~LOC~~ SOLN
120.0000 mg | Freq: Once | SUBCUTANEOUS | Status: AC
  Administered 2018-10-11: 120 mg via SUBCUTANEOUS

## 2018-10-11 MED FILL — traMADol HCL 50 MG TABS: 50 | 30 days supply | Qty: 90 | Fill #1

## 2018-10-11 MED FILL — BACLOFEN 20 MG TABLET: 20 | 15 days supply | Qty: 120 | Fill #3

## 2018-10-11 MED FILL — MIDODRINE HCL 5 MG TABLET: 5 | 60 days supply | Qty: 60 | Fill #0

## 2018-10-11 MED FILL — CREON DR 24,000 UNITS CAP: 24000-76000 | 30 days supply | Qty: 270 | Fill #0

## 2018-10-11 MED FILL — DIPHENOXYLATE-ATROPINE 2.5-: 2.5-0.025 | 25 days supply | Qty: 100 | Fill #0

## 2018-10-11 NOTE — Progress Notes (Signed)
Hematology and Oncology Follow Up Visit  [name] [id number] Oct 19, 1961 57 y.o. 01/09/2018   Principle Diagnosis:   Metastatic neuroendocrine carcinoma  Von Hipple-Lindau Syndrome  Intermittent iron deficiency anemia secondary to GI bleeding.  Current Therapy:  Temodar/Xeloda - start 03/17/2016 - s/p c#3 - discontinued Somatuline 120mg  SQ q month Lutathera -- cycle #1/4 on 07/04/2018 IV iron as indicated-Feraheme given on 04/19/2018     Interim History:  Mr.  Leider is back for followup.  So far, he is still doing fairly well.  He probably will not have the nephrectomy until after New Year's.  He does not have any issues with abdominal pain.  He does have some spasms on occasion.  He has had no cough or shortness of breath.  The neuropathy in his hands seems to be a little bit better.  He feels that his balance and his coordination is a little better since he had the surgery for removal of the CNS lesion up at [place].  He has not noted any bleeding.  There is been no issues with the suprapubic catheter.  He has had no diarrhea.  When we last saw him, his ferritin was 393 with an iron saturation of 24%.  I think he did get some IV iron.  This was prior to him going up to [facility].  His Lutathera treatments are on hold right now until all of his surgeries are out of the way.  His last chromogranin A was stable at 5.  Currently, his performance status is ECOG 1.    Medications:  Current Outpatient Medications:  .  ACCU-CHEK SMARTVIEW test strip, , Disp: , Rfl: 10 .  baclofen (LIORESAL) 10 mg/mL SUSP, , Disp: , Rfl:  .  baclofen (LIORESAL) 20 MG tablet, , Disp: , Rfl:  .  Balsam [place]-Castor Oil (VENELEX) OINT, Apply topically as needed, Disp: 60 g, Rfl: 4 .  capecitabine (XELODA) 500 MG tablet, Take by mouth., Disp: , Rfl:  .  chlorproMAZINE (THORAZINE) 10 MG tablet, Take by mouth., Disp: , Rfl:  .  diazepam (VALIUM) 5 MG tablet, TAKE 1 TABLET BY MOUTH 3 TIMES DAILY FOR  SPASMS/ANXIETY, Disp: 90 tablet, Rfl: 1 .  diphenoxylate-atropine (LOMOTIL) 2.5-0.025 MG tablet, TAKE 1 TABLET BY MOUTH 4 TIMES DAILY AS NEEDED FOR DIARRHEA OR LOOSE STOOLS, Disp: 100 tablet, Rfl: 0 .  doxazosin (CARDURA) 1 MG tablet, Take 1 tablet (1 mg total) by mouth 2 (two) times daily. (Patient taking differently: Take 1 mg by mouth 2 (two) times daily as needed (blood pressure spikes.). ), Disp: 60 tablet, Rfl: 2 .  dronabinol (MARINOL) 5 MG capsule, TAKE 1 CAPSULE BY MOUTH TWICE DAILY WITH MEALS, Disp: 60 capsule, Rfl: 3 .  gabapentin (NEURONTIN) 300 MG capsule, TAKE 2 CAPSULES BY MOUTH 3 TIMES DAILY., Disp: 180 capsule, Rfl: 6 .  HYDROcodone-acetaminophen (NORCO/VICODIN) 5-325 MG tablet, Take 1-2 tablets by mouth every 6 (six) hours as needed for moderate pain., Disp: 90 tablet, Rfl: 0 .  HYDROmorphone (DILAUDID) 4 MG tablet, Take 1 tablet (4 mg total) by mouth every 6 (six) hours as needed for severe pain., Disp: 40 tablet, Rfl: 0 .  insulin glargine (LANTUS) 100 UNIT/ML injection, Inject 31 Units into the skin daily before breakfast. , Disp: , Rfl:  .  insulin lispro (HUMALOG) 100 UNIT/ML injection, Inject 3-9 Units into the skin 3 (three) times daily before meals. SS, Disp: , Rfl:  .  lipase/protease/amylase (CREON) 12000 units CPEP capsule, Take by mouth.,  Disp: , Rfl:  .  midodrine (PROAMATINE) 5 MG tablet, TKAE 1/2 TABLET BY MOUTH TWICE A DAY AS NEEDED, Disp: 60 tablet, Rfl: 6 .  NON FORMULARY, Take by mouth., Disp: , Rfl:  .  oxybutynin (DITROPAN) 5 MG tablet, 5 mg 2 (two) times daily. , Disp: , Rfl: 3 .  promethazine (PHENERGAN) 12.5 MG tablet, Take 12.5 mg by mouth every 6 (six) hours as needed for nausea. , Disp: , Rfl:  .  tiZANidine (ZANAFLEX) 4 MG tablet, TAKE 2 TABLETS BY MOUTH EVERY 6 HOURS AS NEEDED FOR PAIN, Disp: 30 tablet, Rfl: 3 .  traMADol (ULTRAM) 50 MG tablet, TAKE 1 TABLET BY MOUTH 3 TIMES DAILY, Disp: 90 tablet, Rfl: 2  Allergies:  Allergies  Allergen Reactions  .  Citalopram Diarrhea  . Ciprocin-Fluocin-Procin [Fluocinolone Acetonide] Rash    Pt states this happened with IV Cipro. ABLE TO TAKE PO CIPRO.  . Other Rash    IV-CIPRO    Past Medical History, Surgical history, Social history, and Family History were reviewed and updated.  Review of Systems: Review of Systems  Constitutional: Negative.   HENT: Negative.   Eyes: Negative.   Respiratory: Negative.   Cardiovascular: Negative.   Gastrointestinal: Positive for diarrhea and nausea.  Genitourinary: Positive for frequency.  Musculoskeletal: Positive for myalgias and neck pain.  Skin: Negative.   Neurological: Negative.   Endo/Heme/Allergies: Negative.   Psychiatric/Behavioral: Negative.      Physical Exam:  oral temperature is 97.5 F (36.4 C) (abnormal). His blood pressure is 82/54 (abnormal) and his pulse is 94. His respiration is 18 and oxygen saturation is 99%.   Physical Exam  Constitutional: He is oriented to person, place, and time.  HENT:  Head: Normocephalic and atraumatic.  Mouth/Throat: Oropharynx is clear and moist.  Eyes: Pupils are equal, round, and reactive to light. EOM are normal.  Neck: Normal range of motion.  Cardiovascular: Normal rate, regular rhythm and normal heart sounds.  Pulmonary/Chest: Effort normal and breath sounds normal.  Abdominal: Soft. Bowel sounds are normal.  He has a suprapubic catheter in place  Musculoskeletal: Normal range of motion. He exhibits no edema, tenderness or deformity.  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  Neurological: He is alert and oriented to person, place, and time.  Skin: Skin is warm and dry. No rash noted. No erythema.  Psychiatric: He has a normal mood and affect. His behavior is normal. Judgment and thought content normal.  Vitals reviewed. .  Lab Results  Component Value Date   WBC 11.3 (H) 01/09/2018   HGB 13.8 12/05/2017   HCT 43.8 01/09/2018   MCV  86.4 01/09/2018   PLT 288 01/09/2018     Chemistry      Component Value Date/Time   NA 137 01/09/2018 1118   NA 137 12/05/2017 0840   NA 138 10/12/2016 0955   K 3.3 01/09/2018 1118   K 3.4 12/05/2017 0840   K 3.3 (L) 10/12/2016 0955   CL 104 01/09/2018 1118   CL 101 12/05/2017 0840   CO2 30 01/09/2018 1118   CO2 29 12/05/2017 0840   CO2 25 10/12/2016 0955   BUN 14 01/09/2018 1118   BUN 16 12/05/2017 0840   BUN 22.0 10/12/2016 0955   CREATININE 0.8 12/05/2017 0840   CREATININE 0.8 10/12/2016 0955      Component Value Date/Time   CALCIUM 8.8 01/09/2018 1118   CALCIUM 8.6 12/05/2017  0840   CALCIUM 9.0 10/12/2016 0955   ALKPHOS 101 (H) 01/09/2018 1118   ALKPHOS 139 (H) 12/05/2017 0840   ALKPHOS 159 (H) 10/12/2016 0955   AST 20 01/09/2018 1118   AST 45 (H) 10/12/2016 0955   ALT 12 01/09/2018 1118   ALT 19 12/05/2017 0840   ALT 29 10/12/2016 0955   BILITOT 0.8 01/09/2018 1118   BILITOT 0.62 10/12/2016 0955     Impression and Plan: Mr. Kagel is 57 year old gentleman with von Hippel-Lindau syndrome. He has multiple neuroendocrine tumors. 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 He's had multiple spinal surgeries.  So far, everything seems to be going quite well for him.    He will get his Somatuline today.  I will have him come back in about 5 weeks.  Hopefully, by then, we will have a better idea as to the plans for [facility] surgery.     Volanda Napoleon, MD 2/4/20196:20 PM

## 2018-10-11 NOTE — Patient Instructions (Signed)
Lanreotide injection What is this medicine? LANREOTIDE (lan REE oh tide) is used to reduce blood levels of growth hormone in patients with a condition called acromegaly. It also works to slow or stop tumor growth in patients with neuroendocrine tumors and treat carcinoid syndrome. This medicine may be used for other purposes; ask your health care provider or pharmacist if you have questions. COMMON BRAND NAME(S): Somatuline [place] What should I tell my health care provider before I take this medicine? They need to know if you have any of these conditions: -diabetes -gallbladder disease -heart disease -kidney disease -liver disease -thyroid disease -an unusual or allergic reaction to lanreotide, other medicines, foods, dyes, or preservatives -pregnant or trying to get pregnant -breast-feeding How should I use this medicine? This medicine is for injection under the skin. It is given by a health care professional in a hospital or clinic setting. Contact your pediatrician or health care professional regarding the use of this medicine in children. Special care may be needed. Overdosage: If you think you have taken too much of this medicine contact a poison control center or emergency room at once. NOTE: This medicine is only for you. Do not share this medicine with others. What if I miss a dose? It is important not to miss your dose. Call your doctor or health care professional if you are unable to keep an appointment. What may interact with this medicine? This medicine may interact with the following medications: -bromocriptine -cyclosporine -certain medicines for blood pressure, heart disease, irregular heart beat -certain medicines for diabetes -quinidine -terfenadine This list may not describe all possible interactions. Give your health care provider a list of all the medicines, herbs, non-prescription drugs, or dietary supplements you use. Also tell them if you smoke, drink alcohol, or  use illegal drugs. Some items may interact with your medicine. What should I watch for while using this medicine? Tell your doctor or healthcare professional if your symptoms do not start to get better or if they get worse. Visit your doctor or health care professional for regular checks on your progress. Your condition will be monitored carefully while you are receiving this medicine. You may need blood work done while you are taking this medicine. Women should inform their doctor if they wish to become pregnant or think they might be pregnant. There is a potential for serious side effects to an unborn child. Talk to your health care professional or pharmacist for more information. Do not breast-feed an infant while taking this medicine or for 6 months after stopping it. This medicine has caused ovarian failure in some women. This medicine may interfere with the ability to have a child. Talk with your doctor or health care professional if you are concerned about your fertility. What side effects may I notice from receiving this medicine? Side effects that you should report to your doctor or health care professional as soon as possible: -allergic reactions like skin rash, itching or hives, swelling of the face, lips, or tongue -increased blood pressure -severe stomach pain -signs and symptoms of high blood sugar such as dizziness; dry mouth; dry skin; fruity breath; nausea; stomach pain; increased hunger or thirst; increased urination -signs and symptoms of low blood sugar such as feeling anxious; confusion; dizziness; increased hunger; unusually weak or tired; sweating; shakiness; cold; irritable; headache; blurred vision; fast heartbeat; loss of consciousness -unusually slow heartbeat Side effects that usually do not require medical attention (report to your doctor or health care professional if they continue   or are bothersome): -constipation -diarrhea -dizziness -headache -muscle pain -muscle  spasms -nausea -pain, redness, or irritation at site where injected This list may not describe all possible side effects. Call your doctor for medical advice about side effects. You may report side effects to FDA at 1-800-FDA-1088. Where should I keep my medicine? This drug is given in a hospital or clinic and will not be stored at home. NOTE: This sheet is a summary. It may not cover all possible information. If you have questions about this medicine, talk to your doctor, pharmacist, or health care provider.  2018 Elsevier/Gold Standard (2016-08-27 10:33:47)  

## 2018-10-12 LAB — CHROMOGRANIN A: Chromogranin A: 4 nmol/L (ref 0–5)

## 2018-10-12 LAB — IRON AND TIBC
IRON: 57 ug/dL (ref 42–163)
Saturation Ratios: 23 % (ref 20–55)
TIBC: 255 ug/dL (ref 202–409)
UIBC: 198 ug/dL (ref 117–376)

## 2018-10-12 LAB — FERRITIN: FERRITIN: 202 ng/mL (ref 24–336)

## 2018-10-13 MED FILL — SOLIFENACIN SUCCINATE 5 MG: 5 | 30 days supply | Qty: 30 | Fill #2

## 2018-10-18 DIAGNOSIS — Q858 Other phakomatoses, not elsewhere classified: Secondary | ICD-10-CM | POA: Diagnosis not present

## 2018-10-24 ENCOUNTER — Other Ambulatory Visit (HOSPITAL_COMMUNITY): Payer: 59

## 2018-10-26 MED FILL — HUMALOG 100 UNITS/ML KWIKPE: 100 | 63 days supply | Qty: 15 | Fill #1

## 2018-10-26 MED FILL — DRONABINOL 5 MG CAP: 5 | 30 days supply | Qty: 60 | Fill #3

## 2018-10-26 MED FILL — LANTUS 100 UNITS/ML VIAL: 100 | 84 days supply | Qty: 30 | Fill #7

## 2018-10-26 MED FILL — GABAPENTIN 300 MG CAPSULE: 300 | 30 days supply | Qty: 180 | Fill #5

## 2018-11-08 DIAGNOSIS — Z23 Encounter for immunization: Secondary | ICD-10-CM | POA: Diagnosis not present

## 2018-11-08 DIAGNOSIS — E109 Type 1 diabetes mellitus without complications: Secondary | ICD-10-CM | POA: Diagnosis not present

## 2018-11-08 MED FILL — CREON DR 24,000 UNITS CAP: 24000-76000 | 30 days supply | Qty: 270 | Fill #1

## 2018-11-08 MED FILL — BAQSIMI TWO PACK 3 MG/DOSE: 3 | 90 days supply | Qty: 6 | Fill #0

## 2018-11-10 ENCOUNTER — Other Ambulatory Visit: Payer: Self-pay | Admitting: Hematology & Oncology

## 2018-11-10 DIAGNOSIS — D500 Iron deficiency anemia secondary to blood loss (chronic): Secondary | ICD-10-CM

## 2018-11-10 DIAGNOSIS — C7A8 Other malignant neuroendocrine tumors: Secondary | ICD-10-CM

## 2018-11-10 MED FILL — SOLIFENACIN SUCCINATE 5 MG: 5 | 30 days supply | Qty: 30 | Fill #3

## 2018-11-10 MED FILL — BACLOFEN 20 MG TABLET: 20 | 15 days supply | Qty: 120 | Fill #0

## 2018-11-10 MED FILL — traMADol HCL 50 MG TABS: 50 | 30 days supply | Qty: 90 | Fill #2

## 2018-11-15 ENCOUNTER — Inpatient Hospital Stay: Payer: 59

## 2018-11-15 ENCOUNTER — Encounter: Payer: Self-pay | Admitting: Hematology & Oncology

## 2018-11-15 ENCOUNTER — Inpatient Hospital Stay: Payer: 59 | Attending: Hematology & Oncology | Admitting: Hematology & Oncology

## 2018-11-15 ENCOUNTER — Other Ambulatory Visit: Payer: Self-pay

## 2018-11-15 VITALS — BP 92/49 | HR 77 | Temp 97.8°F | Resp 20

## 2018-11-15 DIAGNOSIS — C7931 Secondary malignant neoplasm of brain: Secondary | ICD-10-CM | POA: Insufficient documentation

## 2018-11-15 DIAGNOSIS — C7A8 Other malignant neuroendocrine tumors: Secondary | ICD-10-CM | POA: Diagnosis not present

## 2018-11-15 DIAGNOSIS — C641 Malignant neoplasm of right kidney, except renal pelvis: Secondary | ICD-10-CM | POA: Insufficient documentation

## 2018-11-15 DIAGNOSIS — K922 Gastrointestinal hemorrhage, unspecified: Secondary | ICD-10-CM | POA: Diagnosis not present

## 2018-11-15 DIAGNOSIS — G8220 Paraplegia, unspecified: Secondary | ICD-10-CM | POA: Insufficient documentation

## 2018-11-15 DIAGNOSIS — Z79899 Other long term (current) drug therapy: Secondary | ICD-10-CM | POA: Insufficient documentation

## 2018-11-15 DIAGNOSIS — G8222 Paraplegia, incomplete: Secondary | ICD-10-CM

## 2018-11-15 DIAGNOSIS — D696 Thrombocytopenia, unspecified: Secondary | ICD-10-CM | POA: Diagnosis not present

## 2018-11-15 DIAGNOSIS — D500 Iron deficiency anemia secondary to blood loss (chronic): Secondary | ICD-10-CM | POA: Insufficient documentation

## 2018-11-15 DIAGNOSIS — Z905 Acquired absence of kidney: Secondary | ICD-10-CM | POA: Insufficient documentation

## 2018-11-15 DIAGNOSIS — C642 Malignant neoplasm of left kidney, except renal pelvis: Secondary | ICD-10-CM | POA: Diagnosis not present

## 2018-11-15 DIAGNOSIS — Q858 Other phakomatoses, not elsewhere classified: Secondary | ICD-10-CM | POA: Diagnosis not present

## 2018-11-15 LAB — CMP (CANCER CENTER ONLY)
ALT: 20 U/L (ref 0–44)
AST: 24 U/L (ref 15–41)
Albumin: 3.3 g/dL — ABNORMAL LOW (ref 3.5–5.0)
Alkaline Phosphatase: 127 U/L — ABNORMAL HIGH (ref 38–126)
Anion gap: 6 (ref 5–15)
BUN: 14 mg/dL (ref 6–20)
CO2: 30 mmol/L (ref 22–32)
Calcium: 8.3 mg/dL — ABNORMAL LOW (ref 8.9–10.3)
Chloride: 96 mmol/L — ABNORMAL LOW (ref 98–111)
Creatinine: 0.68 mg/dL (ref 0.61–1.24)
GFR, Est AFR Am: >60 mL/min (ref >60)
GFR, Estimated: >60 mL/min (ref >60)
Glucose, Bld: 289 mg/dL — ABNORMAL HIGH (ref 70–99)
Potassium: 4.1 mmol/L (ref 3.5–5.1)
Sodium: 132 mmol/L — ABNORMAL LOW (ref 135–145)
TOTAL PROTEIN: 6.1 g/dL — AB (ref 6.5–8.1)
Total Bilirubin: 0.5 mg/dL (ref 0.3–1.2)

## 2018-11-15 LAB — CBC WITH DIFFERENTIAL (CANCER CENTER ONLY)
Abs Immature Granulocytes: 0.03 10*3/uL (ref 0.00–0.07)
Basophils Absolute: 0 10*3/uL (ref 0.0–0.1)
Basophils Relative: 0 %
Eosinophils Absolute: 0.1 10*3/uL (ref 0.0–0.5)
Eosinophils Relative: 1 %
HCT: 44.9 % (ref 39.0–52.0)
Hemoglobin: 13.9 g/dL (ref 13.0–17.0)
Immature Granulocytes: 0 %
Lymphocytes Relative: 9 %
Lymphs Abs: 0.8 10*3/uL (ref 0.7–4.0)
MCH: 27.7 pg (ref 26.0–34.0)
MCHC: 31 g/dL (ref 30.0–36.0)
MCV: 89.6 fL (ref 80.0–100.0)
Monocytes Absolute: 0.6 10*3/uL (ref 0.1–1.0)
Monocytes Relative: 7 %
NEUTROS PCT: 83 %
Neutro Abs: 7.3 10*3/uL (ref 1.7–7.7)
Platelet Count: 185 10*3/uL (ref 150–400)
RBC: 5.01 MIL/uL (ref 4.22–5.81)
RDW: 12.2 % (ref 11.5–15.5)
WBC Count: 8.8 10*3/uL (ref 4.0–10.5)
nRBC: 0 % (ref 0.0–0.2)

## 2018-11-15 MED ORDER — LANREOTIDE ACETATE 120 MG/0.5ML ~~LOC~~ SOLN
SUBCUTANEOUS | Status: AC
  Filled 2018-11-15: qty 120

## 2018-11-15 MED ORDER — LANREOTIDE ACETATE 120 MG/0.5ML ~~LOC~~ SOLN
120.0000 mg | Freq: Once | SUBCUTANEOUS | Status: AC
  Administered 2018-11-15: 120 mg via SUBCUTANEOUS

## 2018-11-15 NOTE — Progress Notes (Addendum)
Hematology and Oncology Follow Up Visit  [name] [id number] 09-Oct-1961 57 y.o. 01/09/2018   Principle Diagnosis:   Metastatic neuroendocrine carcinoma  Von Hipple-Lindau Syndrome  Intermittent iron deficiency anemia secondary to GI bleeding.  Current Therapy:  Temodar/Xeloda - start 03/17/2016 - s/p c#3 - discontinued Somatuline 120mg  SQ q month Lutathera -- cycle #1/4 on 07/04/2018 IV iron as indicated-Feraheme given on 04/19/2018     Interim History:  Mr.  [name] is back for followup.  He is doing pretty well.  He likely will go back up to the [facility] in January for the kidney surgery.  This is not been totally set up yet but his wife is certainly on top of this.  There is been no real issues otherwise.  He has recovered very nicely from the neurosurgery that he had for the cerebellar metastasis.  He and his wife had a nice Thanksgiving.  The neuropathy in his hands seems to be improving.  His last chromogranin A back in early November was 4.  We have had to give him IV iron.  His iron studies back in early November showed a ferritin of 202 with an iron saturation of 23%.  He has had no diarrhea.  His Lutathera treatments are on hold right now until all of his surgeries are out of the way.  Currently, his performance status is ECOG 1.    Medications:  Current Outpatient Medications:  .  ACCU-CHEK SMARTVIEW test strip, , Disp: , Rfl: 10 .  baclofen (LIORESAL) 10 mg/mL SUSP, , Disp: , Rfl:  .  baclofen (LIORESAL) 20 MG tablet, , Disp: , Rfl:  .  Balsam Peru-Castor Oil (VENELEX) OINT, Apply topically as needed, Disp: 60 g, Rfl: 4 .  capecitabine (XELODA) 500 MG tablet, Take by mouth., Disp: , Rfl:  .  chlorproMAZINE (THORAZINE) 10 MG tablet, Take by mouth., Disp: , Rfl:  .  diazepam (VALIUM) 5 MG tablet, TAKE 1 TABLET BY MOUTH 3 TIMES DAILY FOR SPASMS/ANXIETY, Disp: 90 tablet, Rfl: 1 .  diphenoxylate-atropine (LOMOTIL) 2.5-0.025 MG tablet, TAKE 1 TABLET BY MOUTH 4  TIMES DAILY AS NEEDED FOR DIARRHEA OR LOOSE STOOLS, Disp: 100 tablet, Rfl: 0 .  doxazosin (CARDURA) 1 MG tablet, Take 1 tablet (1 mg total) by mouth 2 (two) times daily. (Patient taking differently: Take 1 mg by mouth 2 (two) times daily as needed (blood pressure spikes.). ), Disp: 60 tablet, Rfl: 2 .  dronabinol (MARINOL) 5 MG capsule, TAKE 1 CAPSULE BY MOUTH TWICE DAILY WITH MEALS, Disp: 60 capsule, Rfl: 3 .  gabapentin (NEURONTIN) 300 MG capsule, TAKE 2 CAPSULES BY MOUTH 3 TIMES DAILY., Disp: 180 capsule, Rfl: 6 .  HYDROcodone-acetaminophen (NORCO/VICODIN) 5-325 MG tablet, Take 1-2 tablets by mouth every 6 (six) hours as needed for moderate pain., Disp: 90 tablet, Rfl: 0 .  HYDROmorphone (DILAUDID) 4 MG tablet, Take 1 tablet (4 mg total) by mouth every 6 (six) hours as needed for severe pain., Disp: 40 tablet, Rfl: 0 .  insulin glargine (LANTUS) 100 UNIT/ML injection, Inject 31 Units into the skin daily before breakfast. , Disp: , Rfl:  .  insulin lispro (HUMALOG) 100 UNIT/ML injection, Inject 3-9 Units into the skin 3 (three) times daily before meals. SS, Disp: , Rfl:  .  lipase/protease/amylase (CREON) 12000 units CPEP capsule, Take by mouth., Disp: , Rfl:  .  midodrine (PROAMATINE) 5 MG tablet, TKAE 1/2 TABLET BY MOUTH TWICE A DAY AS NEEDED, Disp: 60 tablet, Rfl: 6 .  NON FORMULARY, Take by mouth., Disp: , Rfl:  .  oxybutynin (DITROPAN) 5 MG tablet, 5 mg 2 (two) times daily. , Disp: , Rfl: 3 .  promethazine (PHENERGAN) 12.5 MG tablet, Take 12.5 mg by mouth every 6 (six) hours as needed for nausea. , Disp: , Rfl:  .  tiZANidine (ZANAFLEX) 4 MG tablet, TAKE 2 TABLETS BY MOUTH EVERY 6 HOURS AS NEEDED FOR PAIN, Disp: 30 tablet, Rfl: 3 .  traMADol (ULTRAM) 50 MG tablet, TAKE 1 TABLET BY MOUTH 3 TIMES DAILY, Disp: 90 tablet, Rfl: 2  Allergies:  Allergies  Allergen Reactions  . Citalopram Diarrhea  . Ciprocin-Fluocin-Procin [Fluocinolone Acetonide] Rash    Pt states this happened with IV Cipro.  ABLE TO TAKE PO CIPRO.  . Other Rash    IV-CIPRO    Past Medical History, Surgical history, Social history, and Family History were reviewed and updated.  Review of Systems: Review of Systems  Constitutional: Negative.   HENT: Negative.   Eyes: Negative.   Respiratory: Negative.   Cardiovascular: Negative.   Gastrointestinal: Positive for diarrhea and nausea.  Genitourinary: Positive for frequency.  Musculoskeletal: Positive for myalgias and neck pain.  Skin: Negative.   Neurological: Negative.   Endo/Heme/Allergies: Negative.   Psychiatric/Behavioral: Negative.      Physical Exam:  oral temperature is 97.5 F (36.4 C) (abnormal). His blood pressure is 82/54 (abnormal) and his pulse is 94. His respiration is 18 and oxygen saturation is 99%.   Physical Exam  Constitutional: He is oriented to person, place, and time.  HENT:  Head: Normocephalic and atraumatic.  Mouth/Throat: Oropharynx is clear and moist.  Eyes: Pupils are equal, round, and reactive to light. EOM are normal.  Neck: Normal range of motion.  Cardiovascular: Normal rate, regular rhythm and normal heart sounds.  Pulmonary/Chest: Effort normal and breath sounds normal.  Abdominal: Soft. Bowel sounds are normal.  He has a suprapubic catheter in place  Musculoskeletal: Normal range of motion. He exhibits no edema, tenderness or deformity.  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  Neurological: He is alert and oriented to person, place, and time.  Skin: Skin is warm and dry. No rash noted. No erythema.  Psychiatric: He has a normal mood and affect. His behavior is normal. Judgment and thought content normal.  Vitals reviewed. .  Lab Results  Component Value Date   WBC 11.3 (H) 01/09/2018   HGB 13.8 12/05/2017   HCT 43.8 01/09/2018   MCV 86.4 01/09/2018   PLT 288 01/09/2018     Chemistry      Component Value Date/Time   NA 137 01/09/2018 1118   NA 137  12/05/2017 0840   NA 138 10/12/2016 0955   K 3.3 01/09/2018 1118   K 3.4 12/05/2017 0840   K 3.3 (L) 10/12/2016 0955   CL 104 01/09/2018 1118   CL 101 12/05/2017 0840   CO2 30 01/09/2018 1118   CO2 29 12/05/2017 0840   CO2 25 10/12/2016 0955   BUN 14 01/09/2018 1118   BUN 16 12/05/2017 0840   BUN 22.0 10/12/2016 0955   CREATININE 0.8 12/05/2017 0840   CREATININE 0.8 10/12/2016 0955      Component Value Date/Time   CALCIUM 8.8 01/09/2018 1118   CALCIUM 8.6 12/05/2017 0840   CALCIUM 9.0 10/12/2016 0955   ALKPHOS 101 (H) 01/09/2018 1118   ALKPHOS 139 (H) 12/05/2017 0840   ALKPHOS 159 (H) 10/12/2016 4097  AST 20 01/09/2018 1118   AST 45 (H) 10/12/2016 0955   ALT 12 01/09/2018 1118   ALT 19 12/05/2017 0840   ALT 29 10/12/2016 0955   BILITOT 0.8 01/09/2018 1118   BILITOT 0.62 10/12/2016 0955     Impression and Plan: Mr. Emma is 57 year old gentleman with von Hippel-Lindau syndrome. He has multiple neuroendocrine tumors. 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 He's had multiple spinal surgeries.  So far, everything seems to be going quite well for him.    He will get his Somatuline today.  We really are "on hold" until we know about the surgery for his right kidney.  Hopefully, this will happen in January.  I would like to see him back in early January, preferably before he goes up to [facility].  Of note, that he does need a new wheelchair given his paraplegia.  This will definitely make his quality of life a lot better.  We may have to give him some iron before he goes up to [facility] to make sure that his blood count is as good as possible.Marland Kitchen     Volanda Napoleon, MD 2/4/20196:20 PM

## 2018-11-16 ENCOUNTER — Telehealth: Payer: Self-pay | Admitting: Hematology & Oncology

## 2018-11-16 LAB — IRON AND TIBC
Iron: 37 ug/dL — ABNORMAL LOW (ref 42–163)
Saturation Ratios: 16 % — ABNORMAL LOW (ref 20–55)
TIBC: 234 ug/dL (ref 202–409)
UIBC: 196 ug/dL (ref 117–376)

## 2018-11-16 LAB — CHROMOGRANIN A: Chromogranin A: 5 nmol/L (ref 0–5)

## 2018-11-16 LAB — FERRITIN: Ferritin: 143 ng/mL (ref 24–336)

## 2018-11-16 NOTE — Telephone Encounter (Signed)
Appointments scheduled letter/calendar mailed per 12/11 los

## 2018-11-17 ENCOUNTER — Telehealth: Payer: Self-pay | Admitting: Hematology & Oncology

## 2018-11-17 NOTE — Telephone Encounter (Signed)
Appointment scheduled patients wife requested 12/18 @ 1:00 per 12/12 staff message

## 2018-11-22 ENCOUNTER — Other Ambulatory Visit: Payer: Self-pay

## 2018-11-22 ENCOUNTER — Inpatient Hospital Stay: Payer: 59

## 2018-11-22 VITALS — BP 117/70 | HR 70 | Temp 97.5°F | Resp 18

## 2018-11-22 DIAGNOSIS — C7931 Secondary malignant neoplasm of brain: Secondary | ICD-10-CM | POA: Diagnosis not present

## 2018-11-22 DIAGNOSIS — C7A8 Other malignant neuroendocrine tumors: Secondary | ICD-10-CM | POA: Diagnosis not present

## 2018-11-22 DIAGNOSIS — K922 Gastrointestinal hemorrhage, unspecified: Secondary | ICD-10-CM | POA: Diagnosis not present

## 2018-11-22 DIAGNOSIS — D696 Thrombocytopenia, unspecified: Secondary | ICD-10-CM | POA: Diagnosis not present

## 2018-11-22 DIAGNOSIS — Q858 Other phakomatoses, not elsewhere classified: Secondary | ICD-10-CM | POA: Diagnosis not present

## 2018-11-22 DIAGNOSIS — D500 Iron deficiency anemia secondary to blood loss (chronic): Secondary | ICD-10-CM | POA: Diagnosis not present

## 2018-11-22 DIAGNOSIS — G8220 Paraplegia, unspecified: Secondary | ICD-10-CM | POA: Diagnosis not present

## 2018-11-22 DIAGNOSIS — C642 Malignant neoplasm of left kidney, except renal pelvis: Secondary | ICD-10-CM | POA: Diagnosis not present

## 2018-11-22 DIAGNOSIS — C641 Malignant neoplasm of right kidney, except renal pelvis: Secondary | ICD-10-CM | POA: Diagnosis not present

## 2018-11-22 MED ORDER — SODIUM CHLORIDE 0.9 % IV SOLN
510.0000 mg | Freq: Once | INTRAVENOUS | Status: AC
  Administered 2018-11-22: 510 mg via INTRAVENOUS
  Filled 2018-11-22: qty 17

## 2018-11-22 MED ORDER — SODIUM CHLORIDE 0.9 % IV SOLN
Freq: Once | INTRAVENOUS | Status: AC
  Administered 2018-11-22: 13:00:00 via INTRAVENOUS
  Filled 2018-11-22: qty 250

## 2018-11-22 NOTE — Patient Instructions (Signed)

## 2018-11-23 ENCOUNTER — Ambulatory Visit: Payer: 59 | Attending: Hematology & Oncology | Admitting: Physical Therapy

## 2018-11-23 DIAGNOSIS — M6281 Muscle weakness (generalized): Secondary | ICD-10-CM | POA: Diagnosis not present

## 2018-11-23 DIAGNOSIS — G8222 Paraplegia, incomplete: Secondary | ICD-10-CM | POA: Diagnosis not present

## 2018-11-23 DIAGNOSIS — R2689 Other abnormalities of gait and mobility: Secondary | ICD-10-CM | POA: Insufficient documentation

## 2018-11-23 DIAGNOSIS — E109 Type 1 diabetes mellitus without complications: Secondary | ICD-10-CM | POA: Diagnosis not present

## 2018-11-23 DIAGNOSIS — Z794 Long term (current) use of insulin: Secondary | ICD-10-CM | POA: Diagnosis not present

## 2018-11-24 ENCOUNTER — Encounter: Payer: Self-pay | Admitting: Physical Therapy

## 2018-11-24 NOTE — Therapy (Signed)
[name] [address] [place], [place], [address] Phone: [phone]   Fax:  [phone]  Physical Therapy Evaluation  Patient Details  Name: [name]. MRN: [id number] Date of Birth: 1961/06/29 Referring Provider (PT): Dr. Burney Gauze   Encounter Date: 11/23/2018  PT End of Session - 11/24/18 2026    Visit Number  1    Number of Visits  1    Authorization Type  [employer] and Medicare    PT Start Time  1310    PT Stop Time  1430    PT Time Calculation (min)  80 min       Past Medical History:  Diagnosis Date  . Anemia 08/19/2014  . 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 Diabetes mellitus without complication ([name])   . Headache(784.0)    neuropathic pain usually behind eyes related to blood pressure  . HTN (hypertension) 08/19/2014  . Iron deficiency anemia, unspecified 07/10/2014  . 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 Orthostatic hypotension 08/19/2014  . Transfusion history    '09 .   Marland Kitchen Tumor of optic nerve    right -"stable"  . 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 BACK SURGERY     '91- T#-T5 "tumor resection"-benign  . CERVICAL SPINE SURGERY     '07- resection tumor C5-C7-"benign"  . 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 COLONOSCOPY WITH PROPOFOL N/A 08/20/2014   Procedure: COLONOSCOPY WITH PROPOFOL;  Surgeon: Cleotis Nipper, MD;  Location: [facility];  Service: Endoscopy;  Laterality: N/A;  . CRANIOTOMY FOR TUMOR     medulla tumor "benign tumor"-salvage own bone for closure  . CYSTOSCOPY WITH LITHOLAPAXY N/A 02/04/2014   Procedure: CYSTOSCOPY WITH LITHOLAPAXY;  Surgeon: Franchot Gallo, MD;  Location: [facility];  Service: Urology;  Laterality: N/A;  . ENUCLEATION Left    '05 with prosthesis  . FLEXIBLE SIGMOIDOSCOPY N/A  09/26/2015   Procedure: FLEXIBLE SIGMOIDOSCOPY;  Surgeon: Laurence Spates, MD;  Location: [facility];  Service: Endoscopy;  Laterality: N/A;  . INSERTION OF SUPRAPUBIC CATHETER N/A 02/04/2014   Procedure: INSERTION OF SUPRAPUBIC CATHETER;  Surgeon: Franchot Gallo, MD;  Location: [facility];  Service: Urology;  Laterality: N/A;  . IR GENERIC HISTORICAL  07/15/2016   IR ANGIOGRAM VISCERAL SELECTIVE 07/15/2016 Arne Cleveland, MD [facility]  . IR GENERIC HISTORICAL  07/15/2016   IR EMBO ARTERIAL NOT HEMORR HEMANG INC GUIDE ROADMAPPING 07/15/2016 Arne Cleveland, MD [facility]  . IR GENERIC HISTORICAL  07/15/2016   IR ANGIOGRAM SELECTIVE EACH ADDITIONAL VESSEL 07/15/2016 Arne Cleveland, MD [facility]  . IR GENERIC HISTORICAL  07/15/2016   IR ANGIOGRAM SELECTIVE EACH ADDITIONAL VESSEL 07/15/2016 Arne Cleveland, MD [facility]  . IR GENERIC HISTORICAL  07/15/2016   IR ANGIOGRAM SELECTIVE EACH ADDITIONAL VESSEL 07/15/2016 Arne Cleveland, MD [facility]  . IR GENERIC HISTORICAL  07/15/2016   IR ANGIOGRAM SELECTIVE EACH ADDITIONAL VESSEL 07/15/2016 Arne Cleveland, MD [facility]  . IR GENERIC HISTORICAL  07/15/2016   IR US GUIDE VASC ACCESS RIGHT 07/15/2016 Arne Cleveland, MD [facility]  . IR GENERIC HISTORICAL  07/15/2016   IR ANGIOGRAM VISCERAL SELECTIVE 07/15/2016 Arne Cleveland, MD [facility]  . IR GENERIC HISTORICAL  07/15/2016   IR ANGIOGRAM SELECTIVE EACH ADDITIONAL VESSEL 07/15/2016 Arne Cleveland, MD [facility]  . IR  GENERIC HISTORICAL  07/28/2016   IR ANGIOGRAM VISCERAL SELECTIVE 07/28/2016 Arne Cleveland, MD [facility]  . IR GENERIC HISTORICAL  07/28/2016   IR US GUIDE VASC ACCESS RIGHT 07/28/2016 Arne Cleveland, MD [facility]  . IR GENERIC HISTORICAL  07/28/2016   IR EMBO TUMOR ORGAN ISCHEMIA INFARCT INC GUIDE ROADMAPPING 07/28/2016 Arne Cleveland, MD [facility]  . IR GENERIC HISTORICAL  06/30/2016   IR RADIOLOGIST EVAL & MGMT 06/30/2016 Greggory Keen, MD [facility]  .  MUSCLE FLAP MYOCUTANEOUS / FASCIOCUTANEOUS OF TRUNK Left    '01  . PANCREAS SURGERY     '09- with partial liver resection/ pancreas "[place]ipple"  . PARTIAL NEPHRECTOMY Right    '09- cancer  . PERIPHERALLY INSERTED CENTRAL CATHETER INSERTION     past hx.  . RECONSTRUCTION BREAST W/ TRAM FLAP      There were no vitals filed for this visit.   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 Burney Gauze    Onset Date/Surgical Date  --   1991   Hand Dominance  Right      Precautions   Precautions  Fall      Balance Screen   Has the patient fallen in the past 6 months  No    Has the patient had a decrease in activity level because of a fear of falling?   No    Is the patient reluctant to leave their home because of a fear of falling?   No          Mobility/Seating Evaluation    PATIENT INFORMATION: Name: [name], [name] DOB: [birth year]  Sex: M Date seen: 11-23-18 Time: 1315  Address:  [place], [place] [address] Physician: Burney Gauze, MD This evaluation/justification form will serve as the LMN for the following suppliers: __________________________ Supplier: [employer] Contact Person: [name], Wess Botts Phone:  [phone]   Seating Therapist: Guido Murphy, PT Phone:   [phone]   Phone: [phone]    Spouse/Parent/Caregiver name: [name]  Phone number: [phone] Insurance/Payer: UMR/Medicare     Reason for Referral: power wheelchair eval   Patient/Caregiver Goals: obtain new power wheelchair  Patient was seen for face-to-face evaluation for new power wheelchair.  Also present was [name], [employer] to discuss recommendations and wheelchair options.  Further paperwork was completed and sent to vendor.  Patient appears to qualify for power mobility device at this time per objective findings.   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   History including Falls: Pt has been nonambulatory since 1991; pt reports injury to Rt foot in 2012 due to hitting wall with wheelchair - Rt foot has increased spasticity since that incident- exact injury unknown due to pt not going to MD for evaluation     HOME ENVIRONMENT: _0 House  _1 Condo/town home  _2 Apartment  _3 Assisted Living    _4 Lives Alone _5  Lives with Others                                                                                          Hours with caregiver: 12-18 hours/day  _6 Home is accessible to patient           Stairs      _7 Yes _8  No     Ramp _9 Yes _10 No Comments:  ?????   COMMUNITY ADL: TRANSPORTATION: _11 Car    _12 Van    <WRUEAVWUJWJXBJYN>_8<\/GNFAOZHYQMVHQION>_62 Public Transportation    _14 Adapted w/c Lift    _15 Ambulance    _16 Other:       _17 Sits in wheelchair during transport  Employment/School: ????? Specific requirements pertaining to mobility ?????  Other: ?????    FUNCTIONAL/SENSORY PROCESSING SKILLS:  Handedness:   _18 Right     _19 Left    _20 NA  Comments:  ?????  Functional  Processing Skills for Wheeled Mobility _21 Processing Skills are adequate for safe wheelchair operation  Areas of concern than may interfere with safe operation of wheelchair Description of problem   _22  Attention to environment      _23 Judgment      _24  Hearing  _25  Vision or visual processing      _26 Motor Planning  _27  Fluctuations in Behavior  ?????    VERBAL COMMUNICATION: _28 WFL receptive _29  WFL expressive _30 Understandable  _31 Difficult to understand  _32 non-communicative _33  Uses an augmented communication device  CURRENT SEATING / MOBILITY: Current Mobility Base:  _34 None _35 Dependent _36 Manual _37 Scooter _38 Power  Type of Control: ?????  Manufacturer:  [employer] [id number]:  ?????Age: 42 yrs  Current Condition of Mobility Base:  in disrepair    Current Wheelchair components:  ?????  Describe posture in present seating system:  ?????      SENSATION and SKIN ISSUES: Sensation _39 Intact  _40 Impaired _41 Absent  Level of sensation: absent sensation below T3; impaired sensation, proprioception in Rt hand  Pressure Relief: Able to perform effective pressure relief :    _42 Yes  _43  No Method: ???? If not, Why?: pt has T3 paraplegia; decreased strength and decreased trunk control   Skin Issues/Skin Integrity Current Skin Issues  _44 Yes _45 No _46 Intact _47  Red area_48  Open Area  _49 Scar Tissue _50 At risk from prolonged sitting Where  ?????  History of Skin Issues  _51 Yes _52 No Where  over coccyx area When  Sept. 2019; stage 2  Hx of skin flap surgeries  _53 Yes _54 No Where  on Rt buttock When  2002  Limited sitting tolerance _55 Yes _56 No Hours spent sitting in wheelchair daily: 4-6 hours/day - depending on how he his feeling regarding other medical issues/comorbidities  Complaint of Pain:  Please describe: pt reports constant discomfort in bladder due to Foley bulb irritation (has suprpubic catheter); reports discomfort in neck muscles due to multiple surgeries   Swelling/Edema: None   ADL STATUS (in  reference to wheelchair use):  Indep Assist Unable Indep with Equip Not assessed Comments  Dressing ????? X ????? ????? ????? needs assistance with lower body dressing  Eating ????? X ????? ????? ????? needs asssistance with set up and cutting food  Toileting ????? ????? X ????? ????? has suprapubic catheter  Bathing ????? ????? X ????? ????? has roll in shower; needs assist    Grooming/Hygiene ????? ????? ????? X ????? performs from either bed or wheelchair  Meal Prep ????? ????? X ????? ????? ?????  IADLS ????? X ????? ????? ????? requires use of wheelchair  Bowel Management: _0 Continent  _1 Incontinent  _2 Accidents Comments:  on bowel program  Bladder Management: _3 Continent  _4 Incontinent  _5 Accidents Comments:  has suprapubic catheter     WHEELCHAIR SKILLS: Manual w/c Propulsion: _6 UE or LE strength and endurance sufficient to participate in ADLs using manual wheelchair Arm : _7 left _8 right   _9 Both      Distance: ????? Foot:  _10 left _11 right   _12 Both  Operate Scooter: _13  Strength, hand grip, balance and transfer appropriate for use _14 Living environment is accessible for use of scooter  Operate Power w/c:  _15  Std. Joystick   _16  Alternative Controls Indep _17  Assist _18  Dependent/unable _19  N/A _20   _21 Safe          _22  Functional      Distance: 100'  Bed confined without wheelchair _23  Yes _24  No   STRENGTH/RANGE OF MOTION:  Active/Passive  Range of Motion Strength  Shoulder Rt shoulder flexion 152 degrees:  abduction 138 degrees act.:  Lt shoulder act. flex. 144 degrees;  abdct. = 124 actively 4/5 bil. shoulder musc. strength in available range  Elbow WFL's bil. UE's actively 5/5 bil. UE's  Wrist/Hand WFL's bil. UE's (AROM) 4/5 bil. wrist musc. strength   Hip PROM is WFL's - no AROM 0/5 bil. LE's  Knee passive Rt knee extension = -54 degrees passive Lt knee ext. = -32 degrees 0/5 bil. LE's  Ankle Rt foot passive dorsiflexion = -20 degrees (foot is inverted and plantarflexed):   Lt PROM is WFL's 0/5 bil. LE's     MOBILITY/BALANCE:  _25  Patient is totally dependent for mobility  ?????    Balance Transfers Ambulation  Sitting Balance: Standing Balance: _26  Independent _27  Independent/Modified Independent  _28  WFL     _29  WFL _30  Supervision _31  Supervision  _32  Uses UE for balance  _33  Supervision _34  Min Assist _35  Ambulates with Assist  ?????    _36  Min Assist _37  Min assist _38  Mod Assist _39  Ambulates with Device:      _40  RW  _41  StW  _42  Cane  _43  ?????  _44  Mod Assist _45  Mod assist _46  Max assist   _47  Max Assist _48  Max assist _49  Dependent _50  Indep. Short Distance Only  _51  Unable _52  Unable _53  Lift / Sling Required Distance (in feet)  ?????   _54  Sliding board _55  Unable to Ambulate (see explanation below)  Cardio Status:  _56 Intact  _57  Impaired   _58  NA     ?????  Respiratory Status:  _59 Intact   _60 Impaired   _61 NA     ?????  Orthotics/Prosthetics: ?????  Comments (Address manual vs power w/c vs scooter): Pt is unable to functionally and effectively propel a manual wheelchair due to T3-5 paraplegia  with pt s/p back and neck surgeries for tumor resections.  Pt has impaired sensation, proprioception, & coordination in RUE which would prevent pt from propelling a manual wheelchair.  Pt requires a power wheelchair with power tilt and recline for independence with mobility in his home and to enable independence with performing adequate pressure relief to prevent skin breakdown and to independently change his position for postural control and rest periods in his wheelchair.             Anterior / Posterior Obliquity Rotation-Pelvis ?????  PELVIS    _0  _1  _2   Neutral Posterior Anterior  _3  _4  _5   WFL Rt elev Lt elev  _6  _7  _8   WFL Right Left                      Anterior    Anterior     _9  Fixed _10  Other _11  Partly Flexible _12  Flexible   _13  Fixed _14  Other _15  Partly Flexible  _16  Flexible  _17  Fixed _18  Other _19  Partly Flexible  _20  Flexible   TRUNK  _21  _22  _23   WFL ?  Thoracic ? Lumbar  Kyphosis Lordosis  _24  _25  _26   WFL Convex Convex  Right Left _27 c-curve _28 s-curve _29 multiple  _30  Neutral _31  Left-anterior _32  Right-anterior     _33  Fixed _34  Flexible _35  Partly Flexible _36  Other  _37  Fixed _38  Flexible _39  Partly Flexible _40  Other  _41  Fixed             _42  Flexible _43  Partly Flexible _44  Other    Position Windswept  ?????  HIPS          _45            _46               _47    Neutral       Abduct        ADduct         _48           _49            _50   Neutral Right           Left      _51  Fixed _52  Subluxed _53  Partly Flexible _54  Dislocated _55  Flexible  _56  Fixed _57  Other _58  Partly Flexible  _59  Flexible                 Foot Positioning Knee Positioning  ?????    _60  WFL  _61 Lt _62 Rt _63  WFL  _64 Lt _65 Rt    KNEES ROM concerns: ROM concerns:    & Dorsi-Flexed _66 Lt _67 Rt bil. hamstring tightness;  Rt = -54 degrees:  Lt = -32 degrees (passively)     FEET Plantar Flexed _68 Lt _69 Rt      Inversion                 _70 Lt _71 Rt      Eversion                 _72 Lt _73 Rt     HEAD _74  Functional _75  Good Head Control  ?????  & _76  Flexed         _77  Extended _78  Adequate Head Control    NECK _79  Rotated  Lt  _80  Lat Flexed Lt _81  Rotated  Rt _82  Lat Flexed Rt _83  Limited Head Control     _84  Cervical Hyperextension _85  Absent  Head Control     SHOULDERS ELBOWS WRIST& HAND ?????  Left     Right    Left     Right    Left     Right   U/E _0 Functional           _1 Functional WNL's WNL's _2 Fisting             _3 Fisting      _4 elev   _5 dep      _6 elev   _7 dep       _8 pro -_9 retract     _10 pro  _11 retract _12 subluxed             _13 subluxed           Goals for Wheelchair Mobility  _14  Independence with mobility in the home with motor related ADLs (MRADLs)  _15  Independence with MRADLs in the community _16  Provide dependent mobility  _17  Provide recline     _18 Provide tilt   Goals for Seating system _19  Optimize pressure distribution _20  Provide support needed to  facilitate function or safety _21  Provide corrective forces to assist with maintaining or improving posture _22  Accommodate client's posture:   current seated postures and positions are not flexible or will not tolerate corrective forces _23  Client to be independent with relieving pressure in the wheelchair _24 Enhance physiological function such as breathing, swallowing, digestion  Simulation ideas/Equipment trials:????? State why other equipment was unsuccessful:?????   MOBILITY BASE RECOMMENDATIONS and JUSTIFICATION: MOBILITY COMPONENT JUSTIFICATION  Manufacturer: [employer] Model: F3   Size: Width 17Seat Depth 21 _25 provide transport from point A to B      _26 promote Indep mobility  _27 is not a safe, functional ambulator _28 walker or cane inadequate _29 non-standard width/depth necessary to accommodate anatomical measurement _30  ?????  _31 Manual Mobility Base _32 non-functional ambulator    _33 Scooter/POV  _34 can safely operate  _35 can safely transfer   _36 has adequate trunk stability  _37 cannot functionally propel manual w/c  _38 Power Mobility Base  _39 non-ambulatory  _40 cannot functionally propel manual wheelchair  _41  cannot functionally and safely operate scooter/POV _42 can safely operate and willing to  _43 Stroller Base _44 infant/child  _45 unable to propel manual wheelchair _46 allows for growth _47 non-functional ambulator _48 non-functional UE _49 Indep mobility is not a goal at this time  _50 Tilt  _51 Forward _52 Backward _53 Powered tilt  _54 Manual tilt  _55 change position against gravitational force on head and shoulders  _56 change position for pressure relief/cannot weight shift _57 transfers  _58 management of tone _59 rest periods _60 control edema _61 facilitate postural control  _62  ?????  _63 Recline  _64 Power recline on power base _65 Manual recline on manual base  _66 accommodate femur to back angle  _67 bring to full recline for ADL care  _68 change position for pressure relief/cannot weight shift  _69 rest periods _70 repositioning for transfers or clothing/diaper /catheter changes _71 head positioning  _72 Lighter weight required _73 self- propulsion  _74 lifting _75  ?????  _76 Heavy Duty required _77 user weight greater than 250# _78 extreme tone/ over active movement _79 broken frame on previous chair _80  ?????  _81  Back  _82  Angle Adjustable _83  Custom molded [place] back _84 postural control _85 control of tone/spasticity _86 accommodation of range of motion _87 UE functional control _88 accommodation for seating system _89  ????? _90 provide lateral trunk support _91 accommodate deformity _92 provide posterior trunk support _93 provide lumbar/sacral support _94 support trunk in midline _95 Pressure relief over spinal processes  _96  Seat Cushion Custom comfort Inception with gel interlay  _97 impaired sensation  _98 decubitus ulcers present _99 history of pressure ulceration _100 prevent pelvic extension _101 low maintenance  _102 stabilize pelvis  _103 accommodate obliquity _104 accommodate multiple deformity _105 neutralize lower extremity position _106 increase pressure distribution _107  secondary incontinence cover needed   _108  Pelvic/thigh support  _109  Lateral  thigh guide _0  Distal medial pad  _1  Distal lateral pad _2  pelvis in neutral _3 accommodate pelvis _4  position upper legs _5  alignment _6  accommodate ROM _7  decr adduction _8 accommodate tone _9 removable for transfers _10 decr abduction  _11  Lateral trunk Supports _12  Lt     _13  Rt _14 decrease lateral trunk leaning _15 control tone _16 contour for increased contact _17 safety  _18 accommodate asymmetry _19  ?????  _20  Mounting hardware  _21 lateral trunk supports  _22 back   _23 seat _24 headrest      _25  thigh support _26 fixed   _27 swing away _28 attach seat platform/cushion to w/c frame _29 attach back cushion to w/c frame _30 mount postural supports _31 mount headrest  _32 swing medial thigh support away _33 swing lateral supports away for transfers  _34  removable distal  thigh supports for positioning    Armrests  _35 fixed _36 adjustable height _37 removable   _38 swing away  _39 flip back   _40 reclining _41 full length pads _42 desk    _43 pads tubular  _44 provide support with elbow at 90   _45 provide support for w/c tray _46 change of height/angles for variable activities _47 remove for transfers _48 allow to come closer to table top _49 remove for access to tables _50  Gel pads for comfort due to boney prominences on elbows  Hangers/ Leg rests  _51 60 _52 70 _53 90 _54 elevating _55 heavy duty  _56 articulating _57 fixed _58 lift off _59 swing away     _60 power _61 provide LE support  _62 accommodate to hamstring tightness _63 elevate legs during recline   _64 provide change in position for Legs _65 Maintain placement of feet on footplate _66 durability _67 enable transfers _68 decrease edema _69 Accommodate lower leg length _70  ?????  Foot support Footplate    <XNTZGYFVCBSWHQPR>_9<\/FMBWGYKZLDJTTSVX>_79 Lt  _72  Rt  _73  Center mount _74 flip up     _75 depth/angle adjustable _76 Amputee adapter    _77  Lt     _78  Rt _79 provide foot support _80 accommodate to ankle ROM _81 transfers _82 Provide support for residual extremity _83  allow foot to go under wheelchair base _84  decrease tone  _85  ?????  _86  Ankle strap/heel loops _87 support foot on foot support _88 decrease extraneous movement _89 provide input to heel  _90 protect foot  Tires: _91 pneumatic  _92 flat free inserts  _93 solid  _94 decrease maintenance  _95 prevent frequent flats _96 increase shock absorbency _97 decrease pain from road shock _98 decrease spasms from road shock _99  aggressive tread for terrain & traction  _100  Headrest  _101 provide posterior head support _102 provide posterior neck support _103 provide lateral head support _104 provide anterior head support _105 support during tilt and recline _106 improve feeding   _107 improve respiration _108 placement of switches _109 safety  _110 accommodate ROM  _111 accommodate tone _112 improve visual orientation  _113  Anterior chest strap _114  Vest _115  Shoulder  retractors  _116 decrease forward movement of shoulder _117 accommodation of TLSO _118 decrease forward movement of trunk _119 decrease shoulder elevation _120 added abdominal support _121 alignment _122 assistance with shoulder control  _123  ?????  Pelvic Positioner _124 Belt _125 SubASIS bar _126 Dual Pull _127 stabilize tone _128 decrease falling out of chair/ **will not Decr potential for sliding due to pelvic tilting _129 prevent excessive rotation _130 pad for protection over boney prominence _131 prominence comfort _132 special pull angle to control rotation _133  ?????  Upper Extremity Support _134 L   _135  R _136 Arm trough    _137 hand support _138  tray       _139 full tray _140 swivel mount _141 decrease edema      _142 decrease subluxation   _143 control tone   _144 placement for AAC/Computer/EADL _145 decrease gravitational pull on shoulders _146 provide midline positioning _147 provide support to increase UE function _148 provide hand support in natural position _149 provide work surface   POWER WHEELCHAIR CONTROLS  _150 Proportional  _151 Non-Proportional Type Joystick - Bodypoint U-shaped for improved control   _152 Left  [  x]Right _0 provides access for controlling wheelchair   _1 lacks motor control to operate proportional drive control <[id number]>_7<\/JOITGPQDIYMEBRAX>_0 unable to understand proportional controls  Actuator Control Module  _3 Single  _4 Multiple   _5 Allow the client to operate the power seat function(s) through the joystick control   _6 Safety Reset Switches _7 Used to change modes and stop the wheelchair when driving in latch mode    _8 Upgraded Electronics   _9 programming for accurate control _10 progressive Disease/changing condition _11 non-proportional drive control needed _12 Needed in order to operate power seat functions through joystick control   _13 Display box _14 Allows user to see in which mode and drive the wheelchair is set  _15 necessary for alternate controls    _16 Digital interface electronics _17 Allows w/c to operate when using alternative drive controls  <NMMHWKGSUPJSRPRX>_4<\/VOPFYTWKMQKMMNOT>_77 ASL  Head Array _19 Allows client to operate wheelchair  through switches placed in tri-panel headrest  _20 Sip and puff with tubing kit _21 needed to operate sip and puff drive controls  <NHAFBXUXYBFXOVAN>_1<\/BTYOMAYOKHTXHFSF>_42 Upgraded tracking electronics _23 increase safety when driving <LTRVUYEBXIDHWYSH>_6<\/OHFGBMSXJDBZMCEY>_22 correct tracking when on uneven surfaces  _25 [facility] for switches or joystick _26 Attaches switches to w/c  _27 Swing away for access or transfers _28 midline for optimal placement _29 provides for consistent access  _30 Attendant controlled joystick plus mount _31 safety _32 long distance driving <VVKPQAESLPNPYYFR>_1<\/MYTRZNBVAPOLIDCV>_01 operation of seat functions _34 compliance with transportation regulations _35  pt has fluctuations in functional status due to multiple co-morbidities; pt has orthostatic hypotension resulting in fluctuations in BP with episodes of syncope      Rear wheel placement/Axle adjustability _36 None _37 semi adjustable _38 fully adjustable  _39 improved UE access to wheels _40 improved stability _41 changing angle in space for improvement of postural stability _42 1-arm drive access <THYHOOILNZVJKQAS>_6<\/ORVIFBPPHKFEXMDY>_70 amputee pad placement _44  ?????  Wheel rims/ hand rims  _45 metal  _46 plastic coated _47 oblique projections _48 vertical projections _49 Provide ability to propel manual wheelchair  _50  Increase self-propulsion with hand weakness/decreased grasp  Push handles _51 extended  _52 angle adjustable  _53 standard _54 caregiver access _55 caregiver assist _56 allows "hooking" to enable increased ability to perform ADLs or maintain balance  One armed device  _57 Lt   _58 Rt _59 enable propulsion of manual wheelchair with one arm   _60  ?????   Brake/wheel lock extension _61  Lt   _62  Rt _63 increase indep in applying wheel locks   _64 Side guards _65 prevent clothing getting caught in wheel or becoming soiled _66  prevent skin tears/abrasions  Battery: Group 24 x 2  _67 to power wheelchair ?????  Other: Memory seat program               USB charger A pre-programmed seat program for pressure relief & for orthostatic hypotension  For communication device  and laptop tablet access   ?????  The above equipment has a life- long use expectancy. Growth and changes in medical and/or functional conditions would be the exceptions. This is to certify that the therapist has no financial relationship with durable medical provider or manufacturer. The therapist will not receive remuneration of any kind for the equipment recommended in this evaluation.   Patient has mobility limitation that significantly impairs safe, timely participation in one or more mobility related ADL's.  (bathing, toileting, feeding, dressing, grooming, moving from room to room)                                                             _68  Yes _69  No Will mobility device sufficiently improve ability to participate  and/or be aided in participation of MRADL's?         _0  Yes _1  No Can limitation be compensated for with use of a cane or walker?                                                                                _2  Yes _3  No Does patient or caregiver demonstrate ability/potential ability & willingness to safely use the mobility device?   _4  Yes _5  No Does patient's home environment support use of recommended mobility device?                                                    _6  Yes _7  No Does patient have sufficient upper extremity function necessary to functionally propel a manual wheelchair?    _8  Yes _9  No Does patient have sufficient strength and trunk stability to safely operate a POV (scooter)?                                  _10  Yes _11  No Does patient need additional features/benefits provided by a power wheelchair for MRADL's in the home?       _12  Yes _13  No Does the patient demonstrate the ability to safely use a power wheelchair?                                                              _14  Yes _15  No  Therapist Name Printed: [name], PT Date: 11-23-18  Therapist's Signature:   Date:   Supplier's Name Printed: [name] Date: 11-23-18  Supplier's  Signature:   Date:  Patient/Caregiver Signature:   Date:     This is to certify that I have read this evaluation and do agree with the content within:      Physician's Name Printed: Burney Gauze, MD   86 Signature:  Date:     This is to certify that I, the above signed therapist have the following affiliations: _16  This DME provider _17  Manufacturer of recommended equipment _18  Patient's long term care facility _19  None of the above                                   Plan - 11/24/18 2028    Clinical Impression Statement  Pt evaluated for power wheelchair with power tilt & recline - [name], [employer] with [employer] present for eval; recommend Permobil F3    Clinical Presentation  Stable    Clinical Presentation due to:  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    Clinical Decision Making  Low    PT Frequency  One time visit    PT Treatment/Interventions  Other (comment)   wheelchair management   Recommended Other Services  obtain Permobil F3 power w/c from [employer]    Consulted and Agree with Plan of Care  Patient;Family member/caregiver    Family Member Consulted  wife [name]       Patient will benefit from skilled therapeutic intervention in order to improve the following deficits and impairments:  Decreased strength, Decreased balance, Hypomobility, Impaired flexibility, Impaired tone  Visit Diagnosis: Muscle weakness (generalized) - Plan: PT plan of care cert/re-cert  Other abnormalities of gait and mobility - Plan: PT plan of care cert/re-cert  Paraplegia, incomplete ([place]) - Plan: PT plan of care cert/re-cert     Problem List Patient Active Problem List   Diagnosis Date Noted  . 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 Malnutrition of moderate degree 09/25/2015  . Hypotension 09/24/2015  . Sepsis ([place]) 09/24/2015  . Blood in stool 09/24/2015  . Arterial hypotension   . 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 HTN (hypertension) 08/19/2014  . 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 Orthostatic hypotension 08/19/2014  . Dehydration 08/19/2014  . Anemia 08/19/2014  . Lower urinary tract infectious disease 08/19/2014  . Iron deficiency anemia 07/10/2014  . Respiratory failure ([place]) 05/13/2014  . Baclofen overdose 05/11/2014  . Hypotension, unspecified 05/11/2014  . Acute respiratory failure ([place]) 05/11/2014  . Altered mental status 05/11/2014  . Neurogenic bladder 02/04/2014  . Neuroendocrine carcinoma ([place]) 01/10/2012    Ismerai Bin, [place], PT 11/24/2018, 8:37 PM  [facility] [address] [place], [place], [address] Phone: [phone]   Fax:  [phone]  Name: Kalvyn Desa. MRN: [id number] Date of Birth: [birth year]

## 2018-11-26 MED FILL — GABAPENTIN 300 MG CAPSULE: 300 | 30 days supply | Qty: 180 | Fill #6

## 2018-11-30 ENCOUNTER — Other Ambulatory Visit: Payer: Self-pay | Admitting: Hematology & Oncology

## 2018-11-30 MED FILL — DRONABINOL 5 MG CAP: 5 | 30 days supply | Qty: 60 | Fill #0

## 2018-12-05 ENCOUNTER — Other Ambulatory Visit: Payer: Self-pay | Admitting: Hematology & Oncology

## 2018-12-05 ENCOUNTER — Telehealth: Payer: Self-pay | Admitting: Hematology & Oncology

## 2018-12-05 NOTE — Telephone Encounter (Signed)
MEDICAL RECORDS REQUEST  FAXED RECORDS TO:  [facility]  [address] [place] [address]  [place]: [phone] FAX: [phone]  REQUEST SCANNED 12/05/2018

## 2018-12-07 MED FILL — CREON DR 24,000 UNITS CAP: 24000-76000 | 30 days supply | Qty: 270 | Fill #2

## 2018-12-07 MED FILL — tiZANidine HCL 4 MG TABS: 4 | 4 days supply | Qty: 30 | Fill #1

## 2018-12-11 ENCOUNTER — Other Ambulatory Visit: Payer: Self-pay | Admitting: Family

## 2018-12-11 DIAGNOSIS — D500 Iron deficiency anemia secondary to blood loss (chronic): Secondary | ICD-10-CM

## 2018-12-11 DIAGNOSIS — Q8583 Von Hippel-Lindau syndrome: Secondary | ICD-10-CM

## 2018-12-11 DIAGNOSIS — N319 Neuromuscular dysfunction of bladder, unspecified: Secondary | ICD-10-CM

## 2018-12-11 DIAGNOSIS — Q858 Other phakomatoses, not elsewhere classified: Secondary | ICD-10-CM

## 2018-12-11 DIAGNOSIS — C7A8 Other malignant neuroendocrine tumors: Secondary | ICD-10-CM

## 2018-12-11 MED FILL — traMADol HCL 50 MG TABS: 50 | 30 days supply | Qty: 90 | Fill #0

## 2018-12-11 MED FILL — BACLOFEN 20 MG TABLET: 20 | 15 days supply | Qty: 120 | Fill #1

## 2018-12-12 ENCOUNTER — Encounter: Payer: Self-pay | Admitting: Hematology & Oncology

## 2018-12-12 ENCOUNTER — Encounter: Payer: Self-pay | Admitting: *Deleted

## 2018-12-12 NOTE — Progress Notes (Signed)
This RN spoke with Raquel Sarna at [employer] [phone]) regarding paperwork for a new wheelchair. Raquel Sarna doesn't need a prescription, she needs Dr. Marin Olp to dictate it in his office note. Dr. Marin Olp has added an addendum to the office note from 11/23/2018. This was faxed to [facility] at [phone].

## 2018-12-19 ENCOUNTER — Other Ambulatory Visit (HOSPITAL_COMMUNITY): Payer: 59

## 2018-12-20 ENCOUNTER — Inpatient Hospital Stay: Payer: 59

## 2018-12-20 ENCOUNTER — Encounter: Payer: Self-pay | Admitting: Hematology & Oncology

## 2018-12-20 ENCOUNTER — Inpatient Hospital Stay (HOSPITAL_BASED_OUTPATIENT_CLINIC_OR_DEPARTMENT_OTHER): Payer: 59 | Admitting: Hematology & Oncology

## 2018-12-20 ENCOUNTER — Other Ambulatory Visit: Payer: Self-pay

## 2018-12-20 ENCOUNTER — Inpatient Hospital Stay: Payer: 59 | Attending: Hematology & Oncology

## 2018-12-20 VITALS — BP 101/53 | HR 95 | Temp 97.5°F | Resp 16

## 2018-12-20 DIAGNOSIS — C7B09 Secondary carcinoid tumors of other sites: Secondary | ICD-10-CM | POA: Diagnosis not present

## 2018-12-20 DIAGNOSIS — G839 Paralytic syndrome, unspecified: Secondary | ICD-10-CM | POA: Diagnosis not present

## 2018-12-20 DIAGNOSIS — Z5111 Encounter for antineoplastic chemotherapy: Secondary | ICD-10-CM | POA: Insufficient documentation

## 2018-12-20 DIAGNOSIS — C7A8 Other malignant neuroendocrine tumors: Secondary | ICD-10-CM | POA: Diagnosis not present

## 2018-12-20 DIAGNOSIS — Q858 Other phakomatoses, not elsewhere classified: Secondary | ICD-10-CM | POA: Insufficient documentation

## 2018-12-20 DIAGNOSIS — Z79899 Other long term (current) drug therapy: Secondary | ICD-10-CM | POA: Insufficient documentation

## 2018-12-20 DIAGNOSIS — D500 Iron deficiency anemia secondary to blood loss (chronic): Secondary | ICD-10-CM

## 2018-12-20 DIAGNOSIS — C641 Malignant neoplasm of right kidney, except renal pelvis: Secondary | ICD-10-CM | POA: Insufficient documentation

## 2018-12-20 LAB — CBC WITH DIFFERENTIAL (CANCER CENTER ONLY)
Abs Immature Granulocytes: 0.02 10*3/uL (ref 0.00–0.07)
Basophils Absolute: 0.1 10*3/uL (ref 0.0–0.1)
Basophils Relative: 1 %
Eosinophils Absolute: 0 10*3/uL (ref 0.0–0.5)
Eosinophils Relative: 1 %
HEMATOCRIT: 47 % (ref 39.0–52.0)
Hemoglobin: 15.1 g/dL (ref 13.0–17.0)
Immature Granulocytes: 0 %
LYMPHS ABS: 1.3 10*3/uL (ref 0.7–4.0)
Lymphocytes Relative: 20 %
MCH: 28.4 pg (ref 26.0–34.0)
MCHC: 32.1 g/dL (ref 30.0–36.0)
MCV: 88.3 fL (ref 80.0–100.0)
Monocytes Absolute: 0.5 10*3/uL (ref 0.1–1.0)
Monocytes Relative: 8 %
Neutro Abs: 4.3 10*3/uL (ref 1.7–7.7)
Neutrophils Relative %: 70 %
Platelet Count: 226 10*3/uL (ref 150–400)
RBC: 5.32 MIL/uL (ref 4.22–5.81)
RDW: 12.9 % (ref 11.5–15.5)
WBC Count: 6.2 10*3/uL (ref 4.0–10.5)
nRBC: 0 % (ref 0.0–0.2)

## 2018-12-20 LAB — CMP (CANCER CENTER ONLY)
ALT: 29 U/L (ref 0–44)
AST: 36 U/L (ref 15–41)
Albumin: 3.7 g/dL (ref 3.5–5.0)
Alkaline Phosphatase: 134 U/L — ABNORMAL HIGH (ref 38–126)
Anion gap: 7 (ref 5–15)
BUN: 19 mg/dL (ref 6–20)
CO2: 30 mmol/L (ref 22–32)
Calcium: 8.8 mg/dL — ABNORMAL LOW (ref 8.9–10.3)
Chloride: 96 mmol/L — ABNORMAL LOW (ref 98–111)
Creatinine: 0.84 mg/dL (ref 0.61–1.24)
GFR, Est AFR Am: >60 mL/min (ref >60)
GFR, Estimated: >60 mL/min (ref >60)
Glucose, Bld: 157 mg/dL — ABNORMAL HIGH (ref 70–99)
Potassium: 3.9 mmol/L (ref 3.5–5.1)
Sodium: 133 mmol/L — ABNORMAL LOW (ref 135–145)
TOTAL PROTEIN: 7.1 g/dL (ref 6.5–8.1)
Total Bilirubin: 0.7 mg/dL (ref 0.3–1.2)

## 2018-12-20 MED ORDER — LANREOTIDE ACETATE 120 MG/0.5ML ~~LOC~~ SOLN
SUBCUTANEOUS | Status: AC
  Filled 2018-12-20: qty 120

## 2018-12-20 MED ORDER — LANREOTIDE ACETATE 120 MG/0.5ML ~~LOC~~ SOLN
120.0000 mg | Freq: Once | SUBCUTANEOUS | Status: AC
  Administered 2018-12-20: 120 mg via SUBCUTANEOUS

## 2018-12-20 NOTE — Progress Notes (Signed)
Hematology and Oncology Follow Up Visit  [name] [id number] 1961-01-26 58 y.o. 01/09/2018   Principle Diagnosis:   Metastatic neuroendocrine carcinoma  Von Hipple-Lindau Syndrome  Intermittent iron deficiency anemia secondary to GI bleeding.  Current Therapy:  Temodar/Xeloda - start 03/17/2016 - s/p c#3 - discontinued Somatuline 120mg  SQ q month Lutathera -- cycle #1/4 on 07/04/2018 IV iron as indicated-Feraheme given on 04/19/2018     Interim History:  [name] is back for followup.  He got through the holidays.  Unfortunately, [facility] has not [facility].  The are supposed to be taking out his right kidney because of the renal cell carcinoma.  I am not sure when this will happen.  He is eating okay.  He has had no problems with abdominal pain.  He does have occasional diarrhea.  There is been no problems with neuropathy in his hands.  His last chromogranin A was 5 back in December.  He does get IV iron on occasion.  When we last saw him, his ferritin was 143 with an iron saturation of 16%.  He did get a dose of IV iron which was helps.  I really think that we have to set him up with another nuclear medicine dotatate scan to see how the neuroendocrine carcinoma is doing.  He has had no problems with headaches.  He has had no mouth sores.  His wife is incredibly diligent in monitoring his blood sugars.  Overall, his performance status is ECOG 1.   Medications:  Current Outpatient Medications:  .  ACCU-CHEK SMARTVIEW test strip, , Disp: , Rfl: 10 .  baclofen (LIORESAL) 10 mg/mL SUSP, , Disp: , Rfl:  .  baclofen (LIORESAL) 20 MG tablet, , Disp: , Rfl:  .  Balsam Peru-Castor Oil (VENELEX) OINT, Apply topically as needed, Disp: 60 g, Rfl: 4 .  capecitabine (XELODA) 500 MG tablet, Take by mouth., Disp: , Rfl:  .  chlorproMAZINE (THORAZINE) 10 MG tablet, Take by mouth., Disp: , Rfl:  .  diazepam (VALIUM) 5 MG tablet, TAKE 1 TABLET BY MOUTH 3 TIMES DAILY  FOR SPASMS/ANXIETY, Disp: 90 tablet, Rfl: 1 .  diphenoxylate-atropine (LOMOTIL) 2.5-0.025 MG tablet, TAKE 1 TABLET BY MOUTH 4 TIMES DAILY AS NEEDED FOR DIARRHEA OR LOOSE STOOLS, Disp: 100 tablet, Rfl: 0 .  doxazosin (CARDURA) 1 MG tablet, Take 1 tablet (1 mg total) by mouth 2 (two) times daily. (Patient taking differently: Take 1 mg by mouth 2 (two) times daily as needed (blood pressure spikes.). ), Disp: 60 tablet, Rfl: 2 .  dronabinol (MARINOL) 5 MG capsule, TAKE 1 CAPSULE BY MOUTH TWICE DAILY WITH MEALS, Disp: 60 capsule, Rfl: 3 .  gabapentin (NEURONTIN) 300 MG capsule, TAKE 2 CAPSULES BY MOUTH 3 TIMES DAILY., Disp: 180 capsule, Rfl: 6 .  HYDROcodone-acetaminophen (NORCO/VICODIN) 5-325 MG tablet, Take 1-2 tablets by mouth every 6 (six) hours as needed for moderate pain., Disp: 90 tablet, Rfl: 0 .  HYDROmorphone (DILAUDID) 4 MG tablet, Take 1 tablet (4 mg total) by mouth every 6 (six) hours as needed for severe pain., Disp: 40 tablet, Rfl: 0 .  insulin glargine (LANTUS) 100 UNIT/ML injection, Inject 31 Units into the skin daily before breakfast. , Disp: , Rfl:  .  insulin lispro (HUMALOG) 100 UNIT/ML injection, Inject 3-9 Units into the skin 3 (three) times daily before meals. SS, Disp: , Rfl:  .  lipase/protease/amylase (CREON) 12000 units CPEP capsule, Take by mouth., Disp: , Rfl:  .  midodrine (PROAMATINE) 5 MG tablet, TKAE 1/2 TABLET BY MOUTH TWICE A DAY AS NEEDED, Disp: 60 tablet, Rfl: 6 .  NON FORMULARY, Take by mouth., Disp: , Rfl:  .  oxybutynin (DITROPAN) 5 MG tablet, 5 mg 2 (two) times daily. , Disp: , Rfl: 3 .  promethazine (PHENERGAN) 12.5 MG tablet, Take 12.5 mg by mouth every 6 (six) hours as needed for nausea. , Disp: , Rfl:  .  tiZANidine (ZANAFLEX) 4 MG tablet, TAKE 2 TABLETS BY MOUTH EVERY 6 HOURS AS NEEDED FOR PAIN, Disp: 30 tablet, Rfl: 3 .  traMADol (ULTRAM) 50 MG tablet, TAKE 1 TABLET BY MOUTH 3 TIMES DAILY, Disp: 90 tablet, Rfl: 2  Allergies:  Allergies  Allergen Reactions   . Citalopram Diarrhea  . Ciprocin-Fluocin-Procin [Fluocinolone Acetonide] Rash    Pt states this happened with IV Cipro. ABLE TO TAKE PO CIPRO.  . Other Rash    IV-CIPRO    Past Medical History, Surgical history, Social history, and Family History were reviewed and updated.  Review of Systems: Review of Systems  Constitutional: Negative.   HENT: Negative.   Eyes: Negative.   Respiratory: Negative.   Cardiovascular: Negative.   Gastrointestinal: Positive for diarrhea and nausea.  Genitourinary: Positive for frequency.  Musculoskeletal: Positive for myalgias and neck pain.  Skin: Negative.   Neurological: Negative.   Endo/Heme/Allergies: Negative.   Psychiatric/Behavioral: Negative.      Physical Exam:  oral temperature is 97.5 F (36.4 C) (abnormal). His blood pressure is 82/54 (abnormal) and his pulse is 94. His respiration is 18 and oxygen saturation is 99%.   Physical Exam Vitals signs reviewed.  HENT:     Head: Normocephalic and atraumatic.  Eyes:     Pupils: Pupils are equal, round, and reactive to light.  Neck:     Musculoskeletal: Normal range of motion.  Cardiovascular:     Rate and Rhythm: Normal rate and regular rhythm.     Heart sounds: Normal heart sounds.  Pulmonary:     Effort: Pulmonary effort is normal.     Breath sounds: Normal breath sounds.  Abdominal:     General: Bowel sounds are normal.     Palpations: Abdomen is soft.     Comments: He has a suprapubic catheter in place  Musculoskeletal: Normal range of motion.        General: No tenderness or deformity.     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  Skin:    General: Skin is warm and dry.     Findings: No erythema or rash.  Neurological:     Mental Status: He is alert and oriented to person, place, and time.  Psychiatric:        Behavior: Behavior normal.        Thought Content: Thought content normal.        Judgment:  Judgment normal.   .  Lab Results  Component Value Date   WBC 11.3 (H) 01/09/2018   HGB 13.8 12/05/2017   HCT 43.8 01/09/2018   MCV 86.4 01/09/2018   PLT 288 01/09/2018     Chemistry      Component Value Date/Time   NA 137 01/09/2018 1118   NA 137 12/05/2017 0840   NA 138 10/12/2016 0955   K 3.3 01/09/2018 1118   K 3.4 12/05/2017 0840   K 3.3 (L) 10/12/2016 0955   CL 104 01/09/2018 1118   CL 101  12/05/2017 0840   CO2 30 01/09/2018 1118   CO2 29 12/05/2017 0840   CO2 25 10/12/2016 0955   BUN 14 01/09/2018 1118   BUN 16 12/05/2017 0840   BUN 22.0 10/12/2016 0955   CREATININE 0.8 12/05/2017 0840   CREATININE 0.8 10/12/2016 0955      Component Value Date/Time   CALCIUM 8.8 01/09/2018 1118   CALCIUM 8.6 12/05/2017 0840   CALCIUM 9.0 10/12/2016 0955   ALKPHOS 101 (H) 01/09/2018 1118   ALKPHOS 139 (H) 12/05/2017 0840   ALKPHOS 159 (H) 10/12/2016 0955   AST 20 01/09/2018 1118   AST 45 (H) 10/12/2016 0955   ALT 12 01/09/2018 1118   ALT 19 12/05/2017 0840   ALT 29 10/12/2016 0955   BILITOT 0.8 01/09/2018 1118   BILITOT 0.62 10/12/2016 0955     Impression and Plan: [name] is 58 year old gentleman with von Hippel-Lindau syndrome. He has multiple neuroendocrine tumors. 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 He's had multiple spinal surgeries.  So far, everything seems to be going quite well for him.    He will get his Somatuline today.  We really are "on hold" until we know about the surgery for his right kidney.  Hopefully, this will still happen in January.  His wife is going to call to see if they have made a decision about surgery.  We will do the dotatate scan on him before we see him back.  I will see him back in 4 weeks.    Volanda Napoleon, MD 2/4/20196:20 PM

## 2018-12-21 LAB — IRON AND TIBC
Iron: 96 ug/dL (ref 42–163)
Saturation Ratios: 37 % (ref 20–55)
TIBC: 264 ug/dL (ref 202–409)
UIBC: 167 ug/dL (ref 117–376)

## 2018-12-21 LAB — CHROMOGRANIN A: Chromogranin A: 59.5 ng/mL (ref 0.0–101.8)

## 2018-12-21 LAB — FERRITIN: Ferritin: 307 ng/mL (ref 24–336)

## 2018-12-26 ENCOUNTER — Other Ambulatory Visit: Payer: Self-pay | Admitting: Hematology & Oncology

## 2018-12-26 ENCOUNTER — Telehealth: Payer: Self-pay | Admitting: Hematology & Oncology

## 2018-12-26 DIAGNOSIS — Z23 Encounter for immunization: Secondary | ICD-10-CM

## 2018-12-26 DIAGNOSIS — C7A8 Other malignant neuroendocrine tumors: Secondary | ICD-10-CM

## 2018-12-26 MED FILL — GABAPENTIN 300 MG CAPSULE: 300 | 30 days supply | Qty: 180 | Fill #0

## 2018-12-26 NOTE — Telephone Encounter (Signed)
Called and spoke with patients spouse regarding appointment for PET scan date/time/location & NPO after midnight

## 2018-12-28 MED FILL — SOLIFENACIN SUCCINATE 5 MG: 5 | 30 days supply | Qty: 30 | Fill #4

## 2019-01-01 MED FILL — DRONABINOL 5 MG CAP: 5 | 30 days supply | Qty: 60 | Fill #1

## 2019-01-03 DIAGNOSIS — H40021 Open angle with borderline findings, high risk, right eye: Secondary | ICD-10-CM | POA: Diagnosis not present

## 2019-01-05 ENCOUNTER — Ambulatory Visit (HOSPITAL_COMMUNITY)
Admission: RE | Admit: 2019-01-05 | Discharge: 2019-01-05 | Disposition: A | Discharge status: Home / Self Care | Payer: 59 | Source: Ambulatory Visit | Attending: Hematology & Oncology | Admitting: Hematology & Oncology

## 2019-01-05 DIAGNOSIS — C787 Secondary malignant neoplasm of liver and intrahepatic bile duct: Secondary | ICD-10-CM | POA: Diagnosis not present

## 2019-01-05 DIAGNOSIS — C7A8 Other malignant neuroendocrine tumors: Secondary | ICD-10-CM

## 2019-01-05 DIAGNOSIS — C7A1 Malignant poorly differentiated neuroendocrine tumors: Secondary | ICD-10-CM | POA: Diagnosis not present

## 2019-01-05 MED ORDER — GALLIUM GA 68 DOTATATE IV KIT: 4.0000 | PACK | Freq: Once | INTRAVENOUS | Status: DC

## 2019-01-08 MED FILL — CREON DR 24,000 UNITS CAP: 24000-76000 | 30 days supply | Qty: 270 | Fill #3

## 2019-01-09 MED FILL — traMADol HCL 50 MG TABS: 50 | 30 days supply | Qty: 90 | Fill #1

## 2019-01-09 MED FILL — BACLOFEN 20 MG TABLET: 20 | 15 days supply | Qty: 120 | Fill #2

## 2019-01-10 MED FILL — tiZANidine HCL 4 MG TABS: 4 | 4 days supply | Qty: 30 | Fill #2

## 2019-01-17 ENCOUNTER — Inpatient Hospital Stay: Payer: 59

## 2019-01-17 ENCOUNTER — Inpatient Hospital Stay: Payer: 59 | Attending: Hematology & Oncology | Admitting: Hematology & Oncology

## 2019-01-17 VITALS — BP 108/71 | HR 77 | Temp 98.2°F | Resp 17

## 2019-01-17 DIAGNOSIS — D500 Iron deficiency anemia secondary to blood loss (chronic): Secondary | ICD-10-CM

## 2019-01-17 DIAGNOSIS — C641 Malignant neoplasm of right kidney, except renal pelvis: Secondary | ICD-10-CM

## 2019-01-17 DIAGNOSIS — C7B09 Secondary carcinoid tumors of other sites: Secondary | ICD-10-CM | POA: Diagnosis not present

## 2019-01-17 DIAGNOSIS — C7A8 Other malignant neuroendocrine tumors: Secondary | ICD-10-CM | POA: Diagnosis not present

## 2019-01-17 DIAGNOSIS — Q858 Other phakomatoses, not elsewhere classified: Secondary | ICD-10-CM | POA: Diagnosis not present

## 2019-01-17 DIAGNOSIS — Z79899 Other long term (current) drug therapy: Secondary | ICD-10-CM | POA: Diagnosis not present

## 2019-01-17 DIAGNOSIS — Z5111 Encounter for antineoplastic chemotherapy: Secondary | ICD-10-CM | POA: Diagnosis not present

## 2019-01-17 DIAGNOSIS — G839 Paralytic syndrome, unspecified: Secondary | ICD-10-CM | POA: Diagnosis not present

## 2019-01-17 LAB — CMP (CANCER CENTER ONLY)
ALT: 21 U/L (ref 0–44)
AST: 28 U/L (ref 15–41)
Albumin: 3.5 g/dL (ref 3.5–5.0)
Alkaline Phosphatase: 119 U/L (ref 38–126)
Anion gap: 5 (ref 5–15)
BUN: 18 mg/dL (ref 6–20)
CALCIUM: 8.9 mg/dL (ref 8.9–10.3)
CO2: 32 mmol/L (ref 22–32)
Chloride: 97 mmol/L — ABNORMAL LOW (ref 98–111)
Creatinine: 0.77 mg/dL (ref 0.61–1.24)
GFR, Est AFR Am: >60 mL/min (ref >60)
Glucose, Bld: 241 mg/dL — ABNORMAL HIGH (ref 70–99)
Potassium: 4.2 mmol/L (ref 3.5–5.1)
Sodium: 134 mmol/L — ABNORMAL LOW (ref 135–145)
Total Bilirubin: 0.6 mg/dL (ref 0.3–1.2)
Total Protein: 6.4 g/dL — ABNORMAL LOW (ref 6.5–8.1)

## 2019-01-17 LAB — CBC WITH DIFFERENTIAL (CANCER CENTER ONLY)
Abs Immature Granulocytes: 0.06 10*3/uL (ref 0.00–0.07)
Basophils Absolute: 0.1 10*3/uL (ref 0.0–0.1)
Basophils Relative: 0 %
Eosinophils Absolute: 0.1 10*3/uL (ref 0.0–0.5)
Eosinophils Relative: 1 %
HCT: 44.9 % (ref 39.0–52.0)
HEMOGLOBIN: 14.3 g/dL (ref 13.0–17.0)
Immature Granulocytes: 1 %
Lymphocytes Relative: 8 %
Lymphs Abs: 0.8 10*3/uL (ref 0.7–4.0)
MCH: 28.5 pg (ref 26.0–34.0)
MCHC: 31.8 g/dL (ref 30.0–36.0)
MCV: 89.6 fL (ref 80.0–100.0)
MONO ABS: 0.9 10*3/uL (ref 0.1–1.0)
MONOS PCT: 8 %
Neutro Abs: 9.2 10*3/uL — ABNORMAL HIGH (ref 1.7–7.7)
Neutrophils Relative %: 82 %
Platelet Count: 207 10*3/uL (ref 150–400)
RBC: 5.01 MIL/uL (ref 4.22–5.81)
RDW: 12.9 % (ref 11.5–15.5)
WBC Count: 11.1 10*3/uL — ABNORMAL HIGH (ref 4.0–10.5)
nRBC: 0 % (ref 0.0–0.2)

## 2019-01-17 MED ORDER — LANREOTIDE ACETATE 120 MG/0.5ML ~~LOC~~ SOLN
SUBCUTANEOUS | Status: AC
  Filled 2019-01-17: qty 120

## 2019-01-17 MED ORDER — LANREOTIDE ACETATE 120 MG/0.5ML ~~LOC~~ SOLN
120.0000 mg | Freq: Once | SUBCUTANEOUS | Status: AC
  Administered 2019-01-17: 120 mg via SUBCUTANEOUS

## 2019-01-17 NOTE — Progress Notes (Signed)
Hematology and Oncology Follow Up Visit  [name] [id number] 1961-05-16 58 y.o. 01/09/2018   Principle Diagnosis:   Metastatic neuroendocrine carcinoma  Von Hipple-Lindau Syndrome  Intermittent iron deficiency anemia secondary to GI bleeding.  Current Therapy:  Temodar/Xeloda - start 03/17/2016 - s/p c#3 - discontinued Somatuline 120mg  SQ q month Lutathera -- cycle #1/4 on 07/04/2018 IV iron as indicated-Feraheme given on 04/19/2018     Interim History:  [name] is back for followup.  He got through the holidays.  Unfortunately, [facility] has not [facility] cancer.  He goes up to [place] next week for a neurosurgery reevaluation.  I just wish that they would make a decision about his kidney.  We did a dotatate scan on him.  This is done on 01/05/2019.  It showed some increased activity in the right lobe of the liver.  Several lesions have not changed.  One lesion does show some growth.  Outside of this, everything else looks fine.  We cannot do another Lutathera treatment because we are supposed to be waiting for him to have his kidney surgery.  He is not can have any scans when [facility] to his kidney cancer.  We will have to scan him when he comes back.  He is doing well otherwise.  He has had no bleeding.  He has had no infections.  He has a chronic Foley catheter in place.  There is been no issues with nausea or vomiting.  His blood sugars have been a little on the higher side.  There is not been any problems with pain.  Overall, his performance status is ECOG 1..  Was  Medications:  Current Outpatient Medications:  .  ACCU-CHEK SMARTVIEW test strip, , Disp: , Rfl: 10 .  baclofen (LIORESAL) 10 mg/mL SUSP, , Disp: , Rfl:  .  baclofen (LIORESAL) 20 MG tablet, , Disp: , Rfl:  .  Balsam Peru-Castor Oil (VENELEX) OINT, Apply topically as needed, Disp: 60 g, Rfl: 4 .  capecitabine (XELODA) 500 MG tablet,  Take by mouth., Disp: , Rfl:  .  chlorproMAZINE (THORAZINE) 10 MG tablet, Take by mouth., Disp: , Rfl:  .  diazepam (VALIUM) 5 MG tablet, TAKE 1 TABLET BY MOUTH 3 TIMES DAILY FOR SPASMS/ANXIETY, Disp: 90 tablet, Rfl: 1 .  diphenoxylate-atropine (LOMOTIL) 2.5-0.025 MG tablet, TAKE 1 TABLET BY MOUTH 4 TIMES DAILY AS NEEDED FOR DIARRHEA OR LOOSE STOOLS, Disp: 100 tablet, Rfl: 0 .  doxazosin (CARDURA) 1 MG tablet, Take 1 tablet (1 mg total) by mouth 2 (two) times daily. (Patient taking differently: Take 1 mg by mouth 2 (two) times daily as needed (blood pressure spikes.). ), Disp: 60 tablet, Rfl: 2 .  dronabinol (MARINOL) 5 MG capsule, TAKE 1 CAPSULE BY MOUTH TWICE DAILY WITH MEALS, Disp: 60 capsule, Rfl: 3 .  gabapentin (NEURONTIN) 300 MG capsule, TAKE 2 CAPSULES BY MOUTH 3 TIMES DAILY., Disp: 180 capsule, Rfl: 6 .  HYDROcodone-acetaminophen (NORCO/VICODIN) 5-325 MG tablet, Take 1-2 tablets by mouth every 6 (six) hours as needed for moderate pain., Disp: 90 tablet, Rfl: 0 .  HYDROmorphone (DILAUDID) 4 MG tablet, Take 1 tablet (4 mg total) by mouth every 6 (six) hours as needed for severe pain., Disp: 40 tablet, Rfl: 0 .  insulin glargine (LANTUS) 100 UNIT/ML injection, Inject 31 Units into the skin daily before breakfast. , Disp: , Rfl:  .  insulin lispro (HUMALOG)  100 UNIT/ML injection, Inject 3-9 Units into the skin 3 (three) times daily before meals. SS, Disp: , Rfl:  .  lipase/protease/amylase (CREON) 12000 units CPEP capsule, Take by mouth., Disp: , Rfl:  .  midodrine (PROAMATINE) 5 MG tablet, TKAE 1/2 TABLET BY MOUTH TWICE A DAY AS NEEDED, Disp: 60 tablet, Rfl: 6 .  NON FORMULARY, Take by mouth., Disp: , Rfl:  .  oxybutynin (DITROPAN) 5 MG tablet, 5 mg 2 (two) times daily. , Disp: , Rfl: 3 .  promethazine (PHENERGAN) 12.5 MG tablet, Take 12.5 mg by mouth every 6 (six) hours as needed for nausea. , Disp: , Rfl:  .  tiZANidine (ZANAFLEX) 4 MG tablet, TAKE 2 TABLETS BY MOUTH EVERY 6 HOURS AS NEEDED  FOR PAIN, Disp: 30 tablet, Rfl: 3 .  traMADol (ULTRAM) 50 MG tablet, TAKE 1 TABLET BY MOUTH 3 TIMES DAILY, Disp: 90 tablet, Rfl: 2  Allergies:  Allergies  Allergen Reactions  . Citalopram Diarrhea  . Ciprocin-Fluocin-Procin [Fluocinolone Acetonide] Rash    Pt states this happened with IV Cipro. ABLE TO TAKE PO CIPRO.  . Other Rash    IV-CIPRO    Past Medical History, Surgical history, Social history, and Family History were reviewed and updated.  Review of Systems: Review of Systems  Constitutional: Negative.   HENT: Negative.   Eyes: Negative.   Respiratory: Negative.   Cardiovascular: Negative.   Gastrointestinal: Positive for diarrhea and nausea.  Genitourinary: Positive for frequency.  Musculoskeletal: Positive for myalgias and neck pain.  Skin: Negative.   Neurological: Negative.   Endo/Heme/Allergies: Negative.   Psychiatric/Behavioral: Negative.      Physical Exam:  oral temperature is 97.5 F (36.4 C) (abnormal). His blood pressure is 82/54 (abnormal) and his pulse is 94. His respiration is 18 and oxygen saturation is 99%.   Physical Exam Vitals signs reviewed.  HENT:     Head: Normocephalic and atraumatic.  Eyes:     Pupils: Pupils are equal, round, and reactive to light.  Neck:     Musculoskeletal: Normal range of motion.  Cardiovascular:     Rate and Rhythm: Normal rate and regular rhythm.     Heart sounds: Normal heart sounds.  Pulmonary:     Effort: Pulmonary effort is normal.     Breath sounds: Normal breath sounds.  Abdominal:     General: Bowel sounds are normal.     Palpations: Abdomen is soft.     Comments: He has a suprapubic catheter in place  Musculoskeletal: Normal range of motion.        General: No tenderness or deformity.     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  Skin:    General: Skin is warm and dry.     Findings: No erythema or rash.  Neurological:     Mental  Status: He is alert and oriented to person, place, and time.  Psychiatric:        Behavior: Behavior normal.        Thought Content: Thought content normal.        Judgment: Judgment normal.   .  Lab Results  Component Value Date   WBC 11.3 (H) 01/09/2018   HGB 13.8 12/05/2017   HCT 43.8 01/09/2018   MCV 86.4 01/09/2018   PLT 288 01/09/2018     Chemistry      Component Value Date/Time   NA 137 01/09/2018 1118   NA 137  12/05/2017 0840   NA 138 10/12/2016 0955   K 3.3 01/09/2018 1118   K 3.4 12/05/2017 0840   K 3.3 (L) 10/12/2016 0955   CL 104 01/09/2018 1118   CL 101 12/05/2017 0840   CO2 30 01/09/2018 1118   CO2 29 12/05/2017 0840   CO2 25 10/12/2016 0955   BUN 14 01/09/2018 1118   BUN 16 12/05/2017 0840   BUN 22.0 10/12/2016 0955   CREATININE 0.8 12/05/2017 0840   CREATININE 0.8 10/12/2016 0955      Component Value Date/Time   CALCIUM 8.8 01/09/2018 1118   CALCIUM 8.6 12/05/2017 0840   CALCIUM 9.0 10/12/2016 0955   ALKPHOS 101 (H) 01/09/2018 1118   ALKPHOS 139 (H) 12/05/2017 0840   ALKPHOS 159 (H) 10/12/2016 0955   AST 20 01/09/2018 1118   AST 45 (H) 10/12/2016 0955   ALT 12 01/09/2018 1118   ALT 19 12/05/2017 0840   ALT 29 10/12/2016 0955   BILITOT 0.8 01/09/2018 1118   BILITOT 0.62 10/12/2016 0955     Impression and Plan: Mr. Skilling is 58 year old gentleman with von Hippel-Lindau syndrome. He has multiple neuroendocrine tumors. 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 He's had multiple spinal surgeries.  I am a little worried about the results of his last nuclear medicine dotatate scan.  We really have to get him back onto the [facility].  Again, I am not sure what the hold-up is with surgery at [facility].  We do need to get some scans on him.  I will scan him when I see him back in a month.  Hopefully, he will hear something when he sees the doctors at [facility] next week.   Volanda Napoleon, MD 2/4/20196:20 PM

## 2019-01-17 NOTE — Patient Instructions (Signed)
Lanreotide injection What is this medicine? LANREOTIDE (lan REE oh tide) is used to reduce blood levels of growth hormone in patients with a condition called acromegaly. It also works to slow or stop tumor growth in patients with neuroendocrine tumors and treat carcinoid syndrome. This medicine may be used for other purposes; ask your health care provider or pharmacist if you have questions. COMMON BRAND NAME(S): Somatuline [place] What should I tell my health care provider before I take this medicine? They need to know if you have any of these conditions: -diabetes -gallbladder disease -heart disease -kidney disease -liver disease -thyroid disease -an unusual or allergic reaction to lanreotide, other medicines, foods, dyes, or preservatives -pregnant or trying to get pregnant -breast-feeding How should I use this medicine? This medicine is for injection under the skin. It is given by a health care professional in a hospital or clinic setting. Contact your pediatrician or health care professional regarding the use of this medicine in children. Special care may be needed. Overdosage: If you think you have taken too much of this medicine contact a poison control center or emergency room at once. NOTE: This medicine is only for you. Do not share this medicine with others. What if I miss a dose? It is important not to miss your dose. Call your doctor or health care professional if you are unable to keep an appointment. What may interact with this medicine? This medicine may interact with the following medications: -bromocriptine -cyclosporine -certain medicines for blood pressure, heart disease, irregular heart beat -certain medicines for diabetes -quinidine -terfenadine This list may not describe all possible interactions. Give your health care provider a list of all the medicines, herbs, non-prescription drugs, or dietary supplements you use. Also tell them if you smoke, drink alcohol, or  use illegal drugs. Some items may interact with your medicine. What should I watch for while using this medicine? Tell your doctor or healthcare professional if your symptoms do not start to get better or if they get worse. Visit your doctor or health care professional for regular checks on your progress. Your condition will be monitored carefully while you are receiving this medicine. You may need blood work done while you are taking this medicine. Women should inform their doctor if they wish to become pregnant or think they might be pregnant. There is a potential for serious side effects to an unborn child. Talk to your health care professional or pharmacist for more information. Do not breast-feed an infant while taking this medicine or for 6 months after stopping it. This medicine has caused ovarian failure in some women. This medicine may interfere with the ability to have a child. Talk with your doctor or health care professional if you are concerned about your fertility. What side effects may I notice from receiving this medicine? Side effects that you should report to your doctor or health care professional as soon as possible: -allergic reactions like skin rash, itching or hives, swelling of the face, lips, or tongue -increased blood pressure -severe stomach pain -signs and symptoms of high blood sugar such as dizziness; dry mouth; dry skin; fruity breath; nausea; stomach pain; increased hunger or thirst; increased urination -signs and symptoms of low blood sugar such as feeling anxious; confusion; dizziness; increased hunger; unusually weak or tired; sweating; shakiness; cold; irritable; headache; blurred vision; fast heartbeat; loss of consciousness -unusually slow heartbeat Side effects that usually do not require medical attention (report to your doctor or health care professional if they continue   anxious; confusion; dizziness; increased hunger; unusually weak or tired; sweating; shakiness; cold; irritable; headache; blurred vision; fast heartbeat; loss of consciousness  -unusually slow heartbeat  Side effects that usually do not require medical attention (report to your doctor or health care professional if they continue or are bothersome):  -constipation  -diarrhea  -dizziness  -headache  -muscle pain  -muscle  spasms  -nausea  -pain, redness, or irritation at site where injected  This list may not describe all possible side effects. Call your doctor for medical advice about side effects. You may report side effects to FDA at 1-800-FDA-1088.  Where should I keep my medicine?  This drug is given in a hospital or clinic and will not be stored at home.  NOTE: This sheet is a summary. It may not cover all possible information. If you have questions about this medicine, talk to your doctor, pharmacist, or health care provider.   2019 Elsevier/Gold Standard (2016-08-27 10:33:47)

## 2019-01-18 LAB — IRON AND TIBC
Iron: 61 ug/dL (ref 42–163)
Saturation Ratios: 25 % (ref 20–55)
TIBC: 243 ug/dL (ref 202–409)
UIBC: 182 ug/dL (ref 117–376)

## 2019-01-18 LAB — FERRITIN: FERRITIN: 233 ng/mL (ref 24–336)

## 2019-01-19 LAB — CHROMOGRANIN A: Chromogranin A (ng/mL): 67.1 ng/mL (ref 0.0–101.8)

## 2019-01-24 MED FILL — GABAPENTIN 300 MG CAPSULE: 300 | 30 days supply | Qty: 180 | Fill #1

## 2019-01-25 LAB — CHROMOGRANIN A REBASELINE: Chromogranin A: 5 nmol/L (ref 0–5)

## 2019-01-29 MED FILL — SOLIFENACIN SUCCINATE 5 MG: 5 | 30 days supply | Qty: 30 | Fill #5

## 2019-01-31 MED FILL — DRONABINOL 5 MG CAP: 5 | 30 days supply | Qty: 60 | Fill #2

## 2019-02-05 ENCOUNTER — Other Ambulatory Visit: Payer: Self-pay | Admitting: Hematology & Oncology

## 2019-02-05 MED FILL — CREON DR 24,000 UNITS CAP: 24000-76000 | 30 days supply | Qty: 270 | Fill #0

## 2019-02-05 MED FILL — MIDODRINE HCL 5 MG TABLET: 5 | 60 days supply | Qty: 60 | Fill #1

## 2019-02-07 ENCOUNTER — Ambulatory Visit (HOSPITAL_BASED_OUTPATIENT_CLINIC_OR_DEPARTMENT_OTHER)
Admission: RE | Admit: 2019-02-07 | Discharge: 2019-02-07 | Disposition: A | Discharge status: Home / Self Care | Payer: 59 | Source: Ambulatory Visit | Attending: Hematology & Oncology | Admitting: Hematology & Oncology

## 2019-02-07 ENCOUNTER — Encounter (HOSPITAL_BASED_OUTPATIENT_CLINIC_OR_DEPARTMENT_OTHER): Payer: Self-pay

## 2019-02-07 ENCOUNTER — Inpatient Hospital Stay (HOSPITAL_BASED_OUTPATIENT_CLINIC_OR_DEPARTMENT_OTHER): Payer: 59 | Admitting: Hematology & Oncology

## 2019-02-07 ENCOUNTER — Inpatient Hospital Stay: Payer: 59

## 2019-02-07 ENCOUNTER — Encounter: Payer: Self-pay | Admitting: Hematology & Oncology

## 2019-02-07 ENCOUNTER — Other Ambulatory Visit: Payer: Self-pay

## 2019-02-07 ENCOUNTER — Inpatient Hospital Stay: Payer: 59 | Attending: Hematology & Oncology

## 2019-02-07 VITALS — BP 91/46 | HR 93 | Resp 20 | Wt 156.0 lb

## 2019-02-07 DIAGNOSIS — T83510S Infection and inflammatory reaction due to cystostomy catheter, sequela: Secondary | ICD-10-CM

## 2019-02-07 DIAGNOSIS — C7A8 Other malignant neuroendocrine tumors: Secondary | ICD-10-CM

## 2019-02-07 DIAGNOSIS — N390 Urinary tract infection, site not specified: Secondary | ICD-10-CM

## 2019-02-07 DIAGNOSIS — Z794 Long term (current) use of insulin: Secondary | ICD-10-CM | POA: Diagnosis not present

## 2019-02-07 DIAGNOSIS — C787 Secondary malignant neoplasm of liver and intrahepatic bile duct: Secondary | ICD-10-CM | POA: Diagnosis not present

## 2019-02-07 DIAGNOSIS — C7A1 Malignant poorly differentiated neuroendocrine tumors: Secondary | ICD-10-CM | POA: Diagnosis not present

## 2019-02-07 DIAGNOSIS — K922 Gastrointestinal hemorrhage, unspecified: Secondary | ICD-10-CM

## 2019-02-07 DIAGNOSIS — D500 Iron deficiency anemia secondary to blood loss (chronic): Secondary | ICD-10-CM | POA: Insufficient documentation

## 2019-02-07 DIAGNOSIS — Z5111 Encounter for antineoplastic chemotherapy: Secondary | ICD-10-CM | POA: Diagnosis not present

## 2019-02-07 DIAGNOSIS — Z79899 Other long term (current) drug therapy: Secondary | ICD-10-CM

## 2019-02-07 LAB — CBC WITH DIFFERENTIAL (CANCER CENTER ONLY)
Abs Immature Granulocytes: 0.02 10*3/uL (ref 0.00–0.07)
BASOS PCT: 1 %
Basophils Absolute: 0 10*3/uL (ref 0.0–0.1)
Eosinophils Absolute: 0.1 10*3/uL (ref 0.0–0.5)
Eosinophils Relative: 2 %
HCT: 46 % (ref 39.0–52.0)
Hemoglobin: 14.6 g/dL (ref 13.0–17.0)
Immature Granulocytes: 0 %
Lymphocytes Relative: 23 %
Lymphs Abs: 1.2 10*3/uL (ref 0.7–4.0)
MCH: 28.6 pg (ref 26.0–34.0)
MCHC: 31.7 g/dL (ref 30.0–36.0)
MCV: 90 fL (ref 80.0–100.0)
Monocytes Absolute: 0.5 10*3/uL (ref 0.1–1.0)
Monocytes Relative: 10 %
NEUTROS ABS: 3.3 10*3/uL (ref 1.7–7.7)
Neutrophils Relative %: 64 %
Platelet Count: 204 10*3/uL (ref 150–400)
RBC: 5.11 MIL/uL (ref 4.22–5.81)
RDW: 12.9 % (ref 11.5–15.5)
WBC Count: 5.2 10*3/uL (ref 4.0–10.5)
nRBC: 0 % (ref 0.0–0.2)

## 2019-02-07 LAB — RETICULOCYTES
Immature Retic Fract: 4 % (ref 2.3–15.9)
RBC.: 5.11 MIL/uL (ref 4.22–5.81)
Retic Count, Absolute: 47.5 10*3/uL (ref 19.0–186.0)
Retic Ct Pct: 0.9 % (ref 0.4–3.1)

## 2019-02-07 LAB — CMP (CANCER CENTER ONLY)
ALT: 23 U/L (ref 0–44)
AST: 30 U/L (ref 15–41)
Albumin: 3.7 g/dL (ref 3.5–5.0)
Alkaline Phosphatase: 124 U/L (ref 38–126)
Anion gap: 7 (ref 5–15)
BUN: 15 mg/dL (ref 6–20)
CO2: 32 mmol/L (ref 22–32)
Calcium: 8.9 mg/dL (ref 8.9–10.3)
Chloride: 99 mmol/L (ref 98–111)
Creatinine: 0.73 mg/dL (ref 0.61–1.24)
GFR, Estimated: >60 mL/min (ref >60)
Glucose, Bld: 113 mg/dL — ABNORMAL HIGH (ref 70–99)
Potassium: 4.3 mmol/L (ref 3.5–5.1)
Sodium: 138 mmol/L (ref 135–145)
Total Bilirubin: 0.5 mg/dL (ref 0.3–1.2)
Total Protein: 7 g/dL (ref 6.5–8.1)

## 2019-02-07 LAB — LACTATE DEHYDROGENASE: LDH: 141 U/L (ref 98–192)

## 2019-02-07 MED ORDER — HYDROMORPHONE HCL 4 MG PO TABS: 4.0000 mg | ORAL_TABLET | Freq: Four times a day (QID) | ORAL | 0 refills | Status: DC | PRN

## 2019-02-07 MED ORDER — IOHEXOL 300 MG/ML  SOLN
100.0000 mL | Freq: Once | INTRAMUSCULAR | Status: AC | PRN
  Administered 2019-02-07: 100 mL via INTRAVENOUS

## 2019-02-07 MED ORDER — LANREOTIDE ACETATE 120 MG/0.5ML ~~LOC~~ SOLN
SUBCUTANEOUS | Status: AC
  Filled 2019-02-07: qty 120

## 2019-02-07 MED ORDER — LANREOTIDE ACETATE 120 MG/0.5ML ~~LOC~~ SOLN
120.0000 mg | Freq: Once | SUBCUTANEOUS | Status: AC
  Administered 2019-02-07: 120 mg via SUBCUTANEOUS

## 2019-02-07 MED FILL — HYDROmorphone HCL 4 MG TABS: 4 | 15 days supply | Qty: 60 | Fill #0

## 2019-02-07 NOTE — Patient Instructions (Signed)
Lanreotide injection What is this medicine? LANREOTIDE (lan REE oh tide) is used to reduce blood levels of growth hormone in patients with a condition called acromegaly. It also works to slow or stop tumor growth in patients with neuroendocrine tumors and treat carcinoid syndrome. This medicine may be used for other purposes; ask your health care provider or pharmacist if you have questions. COMMON BRAND NAME(S): Somatuline [place] What should I tell my health care provider before I take this medicine? They need to know if you have any of these conditions: -diabetes -gallbladder disease -heart disease -kidney disease -liver disease -thyroid disease -an unusual or allergic reaction to lanreotide, other medicines, foods, dyes, or preservatives -pregnant or trying to get pregnant -breast-feeding How should I use this medicine? This medicine is for injection under the skin. It is given by a health care professional in a hospital or clinic setting. Contact your pediatrician or health care professional regarding the use of this medicine in children. Special care may be needed. Overdosage: If you think you have taken too much of this medicine contact a poison control center or emergency room at once. NOTE: This medicine is only for you. Do not share this medicine with others. What if I miss a dose? It is important not to miss your dose. Call your doctor or health care professional if you are unable to keep an appointment. What may interact with this medicine? This medicine may interact with the following medications: -bromocriptine -cyclosporine -certain medicines for blood pressure, heart disease, irregular heart beat -certain medicines for diabetes -quinidine -terfenadine This list may not describe all possible interactions. Give your health care provider a list of all the medicines, herbs, non-prescription drugs, or dietary supplements you use. Also tell them if you smoke, drink alcohol, or  use illegal drugs. Some items may interact with your medicine. What should I watch for while using this medicine? Tell your doctor or healthcare professional if your symptoms do not start to get better or if they get worse. Visit your doctor or health care professional for regular checks on your progress. Your condition will be monitored carefully while you are receiving this medicine. You may need blood work done while you are taking this medicine. Women should inform their doctor if they wish to become pregnant or think they might be pregnant. There is a potential for serious side effects to an unborn child. Talk to your health care professional or pharmacist for more information. Do not breast-feed an infant while taking this medicine or for 6 months after stopping it. This medicine has caused ovarian failure in some women. This medicine may interfere with the ability to have a child. Talk with your doctor or health care professional if you are concerned about your fertility. What side effects may I notice from receiving this medicine? Side effects that you should report to your doctor or health care professional as soon as possible: -allergic reactions like skin rash, itching or hives, swelling of the face, lips, or tongue -increased blood pressure -severe stomach pain -signs and symptoms of high blood sugar such as dizziness; dry mouth; dry skin; fruity breath; nausea; stomach pain; increased hunger or thirst; increased urination -signs and symptoms of low blood sugar such as feeling anxious; confusion; dizziness; increased hunger; unusually weak or tired; sweating; shakiness; cold; irritable; headache; blurred vision; fast heartbeat; loss of consciousness -unusually slow heartbeat Side effects that usually do not require medical attention (report to your doctor or health care professional if they continue   anxious; confusion; dizziness; increased hunger; unusually weak or tired; sweating; shakiness; cold; irritable; headache; blurred vision; fast heartbeat; loss of consciousness  -unusually slow heartbeat  Side effects that usually do not require medical attention (report to your doctor or health care professional if they continue or are bothersome):  -constipation  -diarrhea  -dizziness  -headache  -muscle pain  -muscle  spasms  -nausea  -pain, redness, or irritation at site where injected  This list may not describe all possible side effects. Call your doctor for medical advice about side effects. You may report side effects to FDA at 1-800-FDA-1088.  Where should I keep my medicine?  This drug is given in a hospital or clinic and will not be stored at home.  NOTE: This sheet is a summary. It may not cover all possible information. If you have questions about this medicine, talk to your doctor, pharmacist, or health care provider.   2019 Elsevier/Gold Standard (2016-08-27 10:33:47)

## 2019-02-07 NOTE — Progress Notes (Signed)
Hematology and Oncology Follow Up Visit  [name] [id number] 10/12/1961 58 y.o. 01/09/2018   Principle Diagnosis:   Metastatic neuroendocrine carcinoma  Von Hipple-Lindau Syndrome  Intermittent iron deficiency anemia secondary to GI bleeding.  Current Therapy:  Temodar/Xeloda - start 03/17/2016 - s/p c#3 - discontinued Somatuline 120mg  SQ q month Lutathera -- cycle #1/4 on 07/04/2018 IV iron as indicated-Feraheme given on 11/19/2018     Interim History:  [name] is back for followup.  Unfortunately, he has not [facility].  I really doubt that they are going to operate on his left kidney for the kidney cancer.  I just have a feeling that the do not feel that they can do this and not risk him being on dialysis.  Because of the delay from the [place], he has not had his Lutathera for 7-8 months.  He had his CAT scans done today.  It does show that there is growth in his liver.  As such, we really have to get him back onto the [place].  I will put a call into radiology to see if we cannot get him set back up.  He looks a little distended in his abdomen.  He has had no diarrhea.  He has a chronic indwelling Foley catheter.  He says his urine has not had any issues with being too cloudy or bloody.  He has had no fever.  There is been no rashes.  He has had no leg swelling.  His blood sugars have been doing pretty well.  He is try to watch his appetite.  He is eating fairly well.  He has had chronic loose stools but no obvious diarrhea.  Overall, his performance status is ECOG 1.  Medications:  Current Outpatient Medications:  .  ACCU-CHEK SMARTVIEW test strip, , Disp: , Rfl: 10 .  baclofen (LIORESAL) 10 mg/mL SUSP, , Disp: , Rfl:  .  baclofen (LIORESAL) 20 MG tablet, , Disp: , Rfl:  .  Balsam [place]-Castor Oil (VENELEX) OINT, Apply topically as needed, Disp: 60 g, Rfl: 4 .  capecitabine (XELODA) 500 MG tablet, Take by mouth., Disp: , Rfl:  .  chlorproMAZINE  (THORAZINE) 10 MG tablet, Take by mouth., Disp: , Rfl:  .  diazepam (VALIUM) 5 MG tablet, TAKE 1 TABLET BY MOUTH 3 TIMES DAILY FOR SPASMS/ANXIETY, Disp: 90 tablet, Rfl: 1 .  diphenoxylate-atropine (LOMOTIL) 2.5-0.025 MG tablet, TAKE 1 TABLET BY MOUTH 4 TIMES DAILY AS NEEDED FOR DIARRHEA OR LOOSE STOOLS, Disp: 100 tablet, Rfl: 0 .  doxazosin (CARDURA) 1 MG tablet, Take 1 tablet (1 mg total) by mouth 2 (two) times daily. (Patient taking differently: Take 1 mg by mouth 2 (two) times daily as needed (blood pressure spikes.). ), Disp: 60 tablet, Rfl: 2 .  dronabinol (MARINOL) 5 MG capsule, TAKE 1 CAPSULE BY MOUTH TWICE DAILY WITH MEALS, Disp: 60 capsule, Rfl: 3 .  gabapentin (NEURONTIN) 300 MG capsule, TAKE 2 CAPSULES BY MOUTH 3 TIMES DAILY., Disp: 180 capsule, Rfl: 6 .  HYDROcodone-acetaminophen (NORCO/VICODIN) 5-325 MG tablet, Take 1-2 tablets by mouth every 6 (six) hours as needed for moderate pain., Disp: 90 tablet, Rfl: 0 .  HYDROmorphone (DILAUDID) 4 MG tablet, Take 1 tablet (4 mg total) by mouth every 6 (six) hours as needed for severe pain., Disp: 40 tablet, Rfl: 0 .  insulin glargine (LANTUS) 100 UNIT/ML injection, Inject 31 Units into the skin daily before breakfast. , Disp: , Rfl:  .  insulin  lispro (HUMALOG) 100 UNIT/ML injection, Inject 3-9 Units into the skin 3 (three) times daily before meals. SS, Disp: , Rfl:  .  lipase/protease/amylase (CREON) 12000 units CPEP capsule, Take by mouth., Disp: , Rfl:  .  midodrine (PROAMATINE) 5 MG tablet, TKAE 1/2 TABLET BY MOUTH TWICE A DAY AS NEEDED, Disp: 60 tablet, Rfl: 6 .  NON FORMULARY, Take by mouth., Disp: , Rfl:  .  oxybutynin (DITROPAN) 5 MG tablet, 5 mg 2 (two) times daily. , Disp: , Rfl: 3 .  promethazine (PHENERGAN) 12.5 MG tablet, Take 12.5 mg by mouth every 6 (six) hours as needed for nausea. , Disp: , Rfl:  .  tiZANidine (ZANAFLEX) 4 MG tablet, TAKE 2 TABLETS BY MOUTH EVERY 6 HOURS AS NEEDED FOR PAIN, Disp: 30 tablet, Rfl: 3 .  traMADol  (ULTRAM) 50 MG tablet, TAKE 1 TABLET BY MOUTH 3 TIMES DAILY, Disp: 90 tablet, Rfl: 2  Allergies:  Allergies  Allergen Reactions  . Citalopram Diarrhea  . Ciprocin-Fluocin-Procin [Fluocinolone Acetonide] Rash    Pt states this happened with IV Cipro. ABLE TO TAKE PO CIPRO.  . Other Rash    IV-CIPRO    Past Medical History, Surgical history, Social history, and Family History were reviewed and updated.  Review of Systems: Review of Systems  Constitutional: Negative.   HENT: Negative.   Eyes: Negative.   Respiratory: Negative.   Cardiovascular: Negative.   Gastrointestinal: Positive for diarrhea and nausea.  Genitourinary: Positive for frequency.  Musculoskeletal: Positive for myalgias and neck pain.  Skin: Negative.   Neurological: Negative.   Endo/Heme/Allergies: Negative.   Psychiatric/Behavioral: Negative.      Physical Exam:  oral temperature is 97.5 F (36.4 C) (abnormal). His blood pressure is 82/54 (abnormal) and his pulse is 94. His respiration is 18 and oxygen saturation is 99%.   Physical Exam Vitals signs reviewed.  HENT:     Head: Normocephalic and atraumatic.  Eyes:     Pupils: Pupils are equal, round, and reactive to light.  Neck:     Musculoskeletal: Normal range of motion.  Cardiovascular:     Rate and Rhythm: Normal rate and regular rhythm.     Heart sounds: Normal heart sounds.  Pulmonary:     Effort: Pulmonary effort is normal.     Breath sounds: Normal breath sounds.  Abdominal:     General: Bowel sounds are normal.     Palpations: Abdomen is soft.     Comments: He has a suprapubic catheter in place  Musculoskeletal: Normal range of motion.        General: No tenderness or deformity.     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  Skin:    General: Skin is warm and dry.     Findings: No erythema or rash.  Neurological:     Mental Status: He is alert and oriented to person,  place, and time.  Psychiatric:        Behavior: Behavior normal.        Thought Content: Thought content normal.        Judgment: Judgment normal.   .  Lab Results  Component Value Date   WBC 11.3 (H) 01/09/2018   HGB 13.8 12/05/2017   HCT 43.8 01/09/2018   MCV 86.4 01/09/2018   PLT 288 01/09/2018     Chemistry      Component Value Date/Time   NA 137 01/09/2018 1118  NA 137 12/05/2017 0840   NA 138 10/12/2016 0955   K 3.3 01/09/2018 1118   K 3.4 12/05/2017 0840   K 3.3 (L) 10/12/2016 0955   CL 104 01/09/2018 1118   CL 101 12/05/2017 0840   CO2 30 01/09/2018 1118   CO2 29 12/05/2017 0840   CO2 25 10/12/2016 0955   BUN 14 01/09/2018 1118   BUN 16 12/05/2017 0840   BUN 22.0 10/12/2016 0955   CREATININE 0.8 12/05/2017 0840   CREATININE 0.8 10/12/2016 0955      Component Value Date/Time   CALCIUM 8.8 01/09/2018 1118   CALCIUM 8.6 12/05/2017 0840   CALCIUM 9.0 10/12/2016 0955   ALKPHOS 101 (H) 01/09/2018 1118   ALKPHOS 139 (H) 12/05/2017 0840   ALKPHOS 159 (H) 10/12/2016 0955   AST 20 01/09/2018 1118   AST 45 (H) 10/12/2016 0955   ALT 12 01/09/2018 1118   ALT 19 12/05/2017 0840   ALT 29 10/12/2016 0955   BILITOT 0.8 01/09/2018 1118   BILITOT 0.62 10/12/2016 0955     Impression and Plan: [name] is 58 year old gentleman with von Hippel-Lindau syndrome. He has multiple neuroendocrine tumors. 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 He's had multiple spinal surgeries.  My concerns have been confirmed about the neuroendocrine tumors.  Again, we have to get him back on Lutathera.  Again, if [facility] him I would be surprised with respect to his renal cell cancer.  By CT scan done today, the tumor in his left kidney measures 6.7 cm.  We have to keep follow him along monthly.  He is incredibly complicated given that he is paraplegic.  I spent 30-35 minutes with him.  Everything is not straightforward.  I have to arrange for his  follow-up treatments.  Have to set up for his Lutathera.   Volanda Napoleon, MD 2/4/20196:20 PM

## 2019-02-07 NOTE — Progress Notes (Signed)
Dr. Marin Olp will give Somatuline a little early today. Patient is here today and has transportation issues.

## 2019-02-08 LAB — IRON AND TIBC
Iron: 57 ug/dL (ref 42–163)
Saturation Ratios: 22 % (ref 20–55)
TIBC: 254 ug/dL (ref 202–409)
UIBC: 197 ug/dL (ref 117–376)

## 2019-02-08 LAB — FERRITIN: FERRITIN: 180 ng/mL (ref 24–336)

## 2019-02-08 MED FILL — traMADol HCL 50 MG TABS: 50 | 30 days supply | Qty: 90 | Fill #2

## 2019-02-09 LAB — CHROMOGRANIN A: Chromogranin A (ng/mL): 66.7 ng/mL (ref 0.0–101.8)

## 2019-02-13 ENCOUNTER — Encounter: Payer: Self-pay | Admitting: Hematology & Oncology

## 2019-02-20 MED FILL — BACLOFEN 20 MG TABLET: 20 | 15 days supply | Qty: 120 | Fill #3

## 2019-02-20 MED FILL — GABAPENTIN 300 MG CAPSULE: 300 | 30 days supply | Qty: 180 | Fill #2

## 2019-02-21 ENCOUNTER — Other Ambulatory Visit: Payer: Self-pay | Admitting: Hematology & Oncology

## 2019-02-21 DIAGNOSIS — E109 Type 1 diabetes mellitus without complications: Secondary | ICD-10-CM | POA: Diagnosis not present

## 2019-02-21 DIAGNOSIS — Z794 Long term (current) use of insulin: Secondary | ICD-10-CM | POA: Diagnosis not present

## 2019-02-26 ENCOUNTER — Other Ambulatory Visit: Payer: Self-pay | Admitting: Family

## 2019-02-26 ENCOUNTER — Other Ambulatory Visit: Payer: Self-pay | Admitting: Hematology & Oncology

## 2019-02-26 DIAGNOSIS — K909 Intestinal malabsorption, unspecified: Secondary | ICD-10-CM

## 2019-02-26 DIAGNOSIS — R197 Diarrhea, unspecified: Principal | ICD-10-CM

## 2019-02-26 DIAGNOSIS — Q8583 Von Hippel-Lindau syndrome: Secondary | ICD-10-CM

## 2019-02-26 DIAGNOSIS — Q858 Other phakomatoses, not elsewhere classified: Secondary | ICD-10-CM

## 2019-02-26 DIAGNOSIS — C7A8 Other malignant neuroendocrine tumors: Secondary | ICD-10-CM

## 2019-02-26 DIAGNOSIS — D500 Iron deficiency anemia secondary to blood loss (chronic): Secondary | ICD-10-CM

## 2019-02-26 DIAGNOSIS — N319 Neuromuscular dysfunction of bladder, unspecified: Secondary | ICD-10-CM

## 2019-02-26 MED FILL — LANTUS 100 UNITS/ML VIAL: 100 | 40 days supply | Qty: 10 | Fill #0

## 2019-02-26 MED FILL — DIPHENOXYLATE-ATROPINE 2.5-: 2.5-0.025 | 25 days supply | Qty: 100 | Fill #0

## 2019-02-26 MED FILL — DRONABINOL 5 MG CAP: 5 | 30 days supply | Qty: 60 | Fill #3

## 2019-02-26 MED FILL — FREESTYLE LITE TEST STRIP: 90 days supply | Qty: 400 | Fill #2

## 2019-02-26 MED FILL — HUMALOG 100 UNITS/ML KWIKPE: 100 | 63 days supply | Qty: 15 | Fill #2

## 2019-03-05 ENCOUNTER — Encounter: Payer: Self-pay | Admitting: Hematology & Oncology

## 2019-03-07 ENCOUNTER — Ambulatory Visit: Payer: 59 | Admitting: Hematology & Oncology

## 2019-03-07 ENCOUNTER — Other Ambulatory Visit: Payer: 59

## 2019-03-07 ENCOUNTER — Ambulatory Visit: Payer: 59

## 2019-03-07 MED FILL — CREON DR 24,000 UNITS CAP: 24000-76000 | 30 days supply | Qty: 270 | Fill #1

## 2019-03-08 MED FILL — traMADol HCL 50 MG TABS: 50 | 30 days supply | Qty: 90 | Fill #0

## 2019-03-12 ENCOUNTER — Encounter: Payer: Self-pay | Admitting: Hematology & Oncology

## 2019-03-12 ENCOUNTER — Encounter: Payer: Self-pay | Admitting: *Deleted

## 2019-03-14 ENCOUNTER — Other Ambulatory Visit: Payer: 59

## 2019-03-14 ENCOUNTER — Ambulatory Visit: Payer: 59 | Admitting: Hematology & Oncology

## 2019-03-14 ENCOUNTER — Ambulatory Visit: Payer: 59

## 2019-03-16 ENCOUNTER — Other Ambulatory Visit: Payer: Self-pay | Admitting: *Deleted

## 2019-03-16 MED ORDER — FENTANYL 12 MCG/HR TD PT72: 1.0000 | MEDICATED_PATCH | TRANSDERMAL | 0 refills | Status: DC

## 2019-03-21 ENCOUNTER — Encounter: Payer: Self-pay | Admitting: Hematology & Oncology

## 2019-03-22 MED FILL — FENTANYL 12 MCG/HR PT72: 12 | 30 days supply | Qty: 10 | Fill #0

## 2019-03-23 MED FILL — SOLIFENACIN SUCCINATE 5 MG: 5 | 30 days supply | Qty: 30 | Fill #0

## 2019-03-28 MED FILL — GABAPENTIN 300 MG CAPSULE: 300 | 30 days supply | Qty: 180 | Fill #3

## 2019-03-29 ENCOUNTER — Encounter: Payer: Self-pay | Admitting: Hematology & Oncology

## 2019-04-04 ENCOUNTER — Inpatient Hospital Stay: Payer: 59

## 2019-04-04 ENCOUNTER — Encounter: Payer: Self-pay | Admitting: Hematology & Oncology

## 2019-04-04 ENCOUNTER — Inpatient Hospital Stay: Payer: 59 | Attending: Hematology & Oncology | Admitting: Hematology & Oncology

## 2019-04-04 ENCOUNTER — Other Ambulatory Visit: Payer: Self-pay

## 2019-04-04 VITALS — BP 102/76 | HR 98 | Temp 98.1°F | Resp 17

## 2019-04-04 DIAGNOSIS — C7A8 Other malignant neuroendocrine tumors: Secondary | ICD-10-CM | POA: Diagnosis not present

## 2019-04-04 DIAGNOSIS — D500 Iron deficiency anemia secondary to blood loss (chronic): Secondary | ICD-10-CM | POA: Diagnosis not present

## 2019-04-04 DIAGNOSIS — K922 Gastrointestinal hemorrhage, unspecified: Secondary | ICD-10-CM | POA: Insufficient documentation

## 2019-04-04 DIAGNOSIS — Z79899 Other long term (current) drug therapy: Secondary | ICD-10-CM | POA: Diagnosis not present

## 2019-04-04 DIAGNOSIS — Z794 Long term (current) use of insulin: Secondary | ICD-10-CM | POA: Diagnosis not present

## 2019-04-04 DIAGNOSIS — Z5111 Encounter for antineoplastic chemotherapy: Secondary | ICD-10-CM | POA: Insufficient documentation

## 2019-04-04 LAB — CMP (CANCER CENTER ONLY)
ALT: 20 U/L (ref 0–44)
AST: 29 U/L (ref 15–41)
Albumin: 3.4 g/dL — ABNORMAL LOW (ref 3.5–5.0)
Alkaline Phosphatase: 119 U/L (ref 38–126)
Anion gap: 10 (ref 5–15)
BUN: 17 mg/dL (ref 6–20)
CO2: 27 mmol/L (ref 22–32)
Calcium: 8.9 mg/dL (ref 8.9–10.3)
Chloride: 96 mmol/L — ABNORMAL LOW (ref 98–111)
Creatinine: 0.71 mg/dL (ref 0.61–1.24)
GFR, Est AFR Am: >60 mL/min (ref >60)
GFR, Estimated: >60 mL/min (ref >60)
Glucose, Bld: 316 mg/dL — ABNORMAL HIGH (ref 70–99)
Potassium: 4.5 mmol/L (ref 3.5–5.1)
Sodium: 133 mmol/L — ABNORMAL LOW (ref 135–145)
Total Bilirubin: 0.8 mg/dL (ref 0.3–1.2)
Total Protein: 6.8 g/dL (ref 6.5–8.1)

## 2019-04-04 LAB — CBC WITH DIFFERENTIAL (CANCER CENTER ONLY)
Abs Immature Granulocytes: 0.05 10*3/uL (ref 0.00–0.07)
Basophils Absolute: 0.1 10*3/uL (ref 0.0–0.1)
Basophils Relative: 0 %
Eosinophils Absolute: 0 10*3/uL (ref 0.0–0.5)
Eosinophils Relative: 0 %
HCT: 45.7 % (ref 39.0–52.0)
Hemoglobin: 14.8 g/dL (ref 13.0–17.0)
Immature Granulocytes: 0 %
Lymphocytes Relative: 5 %
Lymphs Abs: 0.7 10*3/uL (ref 0.7–4.0)
MCH: 28.6 pg (ref 26.0–34.0)
MCHC: 32.4 g/dL (ref 30.0–36.0)
MCV: 88.4 fL (ref 80.0–100.0)
Monocytes Absolute: 1 10*3/uL (ref 0.1–1.0)
Monocytes Relative: 8 %
Neutro Abs: 11.3 10*3/uL — ABNORMAL HIGH (ref 1.7–7.7)
Neutrophils Relative %: 87 %
Platelet Count: 209 10*3/uL (ref 150–400)
RBC: 5.17 MIL/uL (ref 4.22–5.81)
RDW: 12.4 % (ref 11.5–15.5)
WBC Count: 13.2 10*3/uL — ABNORMAL HIGH (ref 4.0–10.5)
nRBC: 0 % (ref 0.0–0.2)

## 2019-04-04 MED ORDER — DOXYCYCLINE HYCLATE 100 MG PO TABS: 100.0000 mg | ORAL_TABLET | Freq: Two times a day (BID) | ORAL | 2 refills | Status: DC

## 2019-04-04 MED ORDER — LANREOTIDE ACETATE 120 MG/0.5ML ~~LOC~~ SOLN
120.0000 mg | Freq: Once | SUBCUTANEOUS | Status: AC
  Administered 2019-04-04: 120 mg via SUBCUTANEOUS

## 2019-04-04 MED ORDER — LANREOTIDE ACETATE 120 MG/0.5ML ~~LOC~~ SOLN
SUBCUTANEOUS | Status: AC
  Filled 2019-04-04: qty 120

## 2019-04-04 MED FILL — DOXYCYCLINE HYCLATE 100 MG: 100 | 10 days supply | Qty: 20 | Fill #0

## 2019-04-04 NOTE — Patient Instructions (Signed)
Lanreotide injection What is this medicine? LANREOTIDE (lan REE oh tide) is used to reduce blood levels of growth hormone in patients with a condition called acromegaly. It also works to slow or stop tumor growth in patients with neuroendocrine tumors and treat carcinoid syndrome. This medicine may be used for other purposes; ask your health care provider or pharmacist if you have questions. COMMON BRAND NAME(S): Somatuline [place] What should I tell my health care provider before I take this medicine? They need to know if you have any of these conditions: -diabetes -gallbladder disease -heart disease -kidney disease -liver disease -thyroid disease -an unusual or allergic reaction to lanreotide, other medicines, foods, dyes, or preservatives -pregnant or trying to get pregnant -breast-feeding How should I use this medicine? This medicine is for injection under the skin. It is given by a health care professional in a hospital or clinic setting. Contact your pediatrician or health care professional regarding the use of this medicine in children. Special care may be needed. Overdosage: If you think you have taken too much of this medicine contact a poison control center or emergency room at once. NOTE: This medicine is only for you. Do not share this medicine with others. What if I miss a dose? It is important not to miss your dose. Call your doctor or health care professional if you are unable to keep an appointment. What may interact with this medicine? This medicine may interact with the following medications: -bromocriptine -cyclosporine -certain medicines for blood pressure, heart disease, irregular heart beat -certain medicines for diabetes -quinidine -terfenadine This list may not describe all possible interactions. Give your health care provider a list of all the medicines, herbs, non-prescription drugs, or dietary supplements you use. Also tell them if you smoke, drink alcohol, or  use illegal drugs. Some items may interact with your medicine. What should I watch for while using this medicine? Tell your doctor or healthcare professional if your symptoms do not start to get better or if they get worse. Visit your doctor or health care professional for regular checks on your progress. Your condition will be monitored carefully while you are receiving this medicine. You may need blood work done while you are taking this medicine. Women should inform their doctor if they wish to become pregnant or think they might be pregnant. There is a potential for serious side effects to an unborn child. Talk to your health care professional or pharmacist for more information. Do not breast-feed an infant while taking this medicine or for 6 months after stopping it. This medicine has caused ovarian failure in some women. This medicine may interfere with the ability to have a child. Talk with your doctor or health care professional if you are concerned about your fertility. What side effects may I notice from receiving this medicine? Side effects that you should report to your doctor or health care professional as soon as possible: -allergic reactions like skin rash, itching or hives, swelling of the face, lips, or tongue -increased blood pressure -severe stomach pain -signs and symptoms of high blood sugar such as dizziness; dry mouth; dry skin; fruity breath; nausea; stomach pain; increased hunger or thirst; increased urination -signs and symptoms of low blood sugar such as feeling anxious; confusion; dizziness; increased hunger; unusually weak or tired; sweating; shakiness; cold; irritable; headache; blurred vision; fast heartbeat; loss of consciousness -unusually slow heartbeat Side effects that usually do not require medical attention (report to your doctor or health care professional if they continue   anxious; confusion; dizziness; increased hunger; unusually weak or tired; sweating; shakiness; cold; irritable; headache; blurred vision; fast heartbeat; loss of consciousness  -unusually slow heartbeat  Side effects that usually do not require medical attention (report to your doctor or health care professional if they continue or are bothersome):  -constipation  -diarrhea  -dizziness  -headache  -muscle pain  -muscle  spasms  -nausea  -pain, redness, or irritation at site where injected  This list may not describe all possible side effects. Call your doctor for medical advice about side effects. You may report side effects to FDA at 1-800-FDA-1088.  Where should I keep my medicine?  This drug is given in a hospital or clinic and will not be stored at home.  NOTE: This sheet is a summary. It may not cover all possible information. If you have questions about this medicine, talk to your doctor, pharmacist, or health care provider.   2019 Elsevier/Gold Standard (2016-08-27 10:33:47)

## 2019-04-04 NOTE — Progress Notes (Signed)
Hematology and Oncology Follow Up Visit  [name] [id number] 10-22-1961 58 y.o. 01/09/2018   Principle Diagnosis:   Metastatic neuroendocrine carcinoma  Von Hipple-Lindau Syndrome  Intermittent iron deficiency anemia secondary to GI bleeding.  Current Therapy:  Temodar/Xeloda - start 03/17/2016 - s/p c#3 - discontinued Somatuline 120mg  SQ q month Lutathera -- cycle #1/4 on 07/04/2018 IV iron as indicated-Feraheme given on 11/19/2018     Interim History:  Mr.  [name] is back for followup.  He is doing pretty well.  He has this recurrence of rosacea on his face.  I will call in some doxycycline to see if this can help a little bit.  He still has not yet seen interventional radiology for the [place] treatment.  I will put in another referral for this.  He is having a little bit of abdominal spasms.  He do not think that his urine is all that bad.  He has a suprapubic catheter which causes him issues.  He is had no problems with bleeding.  There is been no fever.  He has had a good appetite.  There has been no nausea or vomiting.  He does have some loose stools which is chronic.  His last Chromogranin A level was six 6.7.  This is stable.  He does would like he will be going [facility] anytime soon for his kidney cancer.  I just worried that by the time he gets up there, it will be too advanced for surgery.  Overall, his performance status is ECOG 1.  Medications:  Current Outpatient Medications:  .  ACCU-CHEK SMARTVIEW test strip, , Disp: , Rfl: 10 .  baclofen (LIORESAL) 10 mg/mL SUSP, , Disp: , Rfl:  .  baclofen (LIORESAL) 20 MG tablet, , Disp: , Rfl:  .  Balsam [place]-Castor Oil (VENELEX) OINT, Apply topically as needed, Disp: 60 g, Rfl: 4 .  capecitabine (XELODA) 500 MG tablet, Take by mouth., Disp: , Rfl:  .  chlorproMAZINE (THORAZINE) 10 MG tablet, Take by mouth., Disp: , Rfl:  .  diazepam (VALIUM) 5 MG tablet, TAKE 1 TABLET BY MOUTH 3 TIMES DAILY FOR  SPASMS/ANXIETY, Disp: 90 tablet, Rfl: 1 .  diphenoxylate-atropine (LOMOTIL) 2.5-0.025 MG tablet, TAKE 1 TABLET BY MOUTH 4 TIMES DAILY AS NEEDED FOR DIARRHEA OR LOOSE STOOLS, Disp: 100 tablet, Rfl: 0 .  doxazosin (CARDURA) 1 MG tablet, Take 1 tablet (1 mg total) by mouth 2 (two) times daily. (Patient taking differently: Take 1 mg by mouth 2 (two) times daily as needed (blood pressure spikes.). ), Disp: 60 tablet, Rfl: 2 .  dronabinol (MARINOL) 5 MG capsule, TAKE 1 CAPSULE BY MOUTH TWICE DAILY WITH MEALS, Disp: 60 capsule, Rfl: 3 .  gabapentin (NEURONTIN) 300 MG capsule, TAKE 2 CAPSULES BY MOUTH 3 TIMES DAILY., Disp: 180 capsule, Rfl: 6 .  HYDROcodone-acetaminophen (NORCO/VICODIN) 5-325 MG tablet, Take 1-2 tablets by mouth every 6 (six) hours as needed for moderate pain., Disp: 90 tablet, Rfl: 0 .  HYDROmorphone (DILAUDID) 4 MG tablet, Take 1 tablet (4 mg total) by mouth every 6 (six) hours as needed for severe pain., Disp: 40 tablet, Rfl: 0 .  insulin glargine (LANTUS) 100 UNIT/ML injection, Inject 31 Units into the skin daily before breakfast. , Disp: , Rfl:  .  insulin lispro (HUMALOG) 100 UNIT/ML injection, Inject 3-9 Units into the skin 3 (three) times daily before meals. SS, Disp: , Rfl:  .  lipase/protease/amylase (CREON) 12000 units CPEP capsule, Take by mouth., Disp: ,  Rfl:  .  midodrine (PROAMATINE) 5 MG tablet, TKAE 1/2 TABLET BY MOUTH TWICE A DAY AS NEEDED, Disp: 60 tablet, Rfl: 6 .  NON FORMULARY, Take by mouth., Disp: , Rfl:  .  oxybutynin (DITROPAN) 5 MG tablet, 5 mg 2 (two) times daily. , Disp: , Rfl: 3 .  promethazine (PHENERGAN) 12.5 MG tablet, Take 12.5 mg by mouth every 6 (six) hours as needed for nausea. , Disp: , Rfl:  .  tiZANidine (ZANAFLEX) 4 MG tablet, TAKE 2 TABLETS BY MOUTH EVERY 6 HOURS AS NEEDED FOR PAIN, Disp: 30 tablet, Rfl: 3 .  traMADol (ULTRAM) 50 MG tablet, TAKE 1 TABLET BY MOUTH 3 TIMES DAILY, Disp: 90 tablet, Rfl: 2  Allergies:  Allergies  Allergen Reactions  .  Citalopram Diarrhea  . Ciprocin-Fluocin-Procin [Fluocinolone Acetonide] Rash    Pt states this happened with IV Cipro. ABLE TO TAKE PO CIPRO.  . Other Rash    IV-CIPRO    Past Medical History, Surgical history, Social history, and Family History were reviewed and updated.  Review of Systems: Review of Systems  Constitutional: Negative.   HENT: Negative.   Eyes: Negative.   Respiratory: Negative.   Cardiovascular: Negative.   Gastrointestinal: Positive for diarrhea and nausea.  Genitourinary: Positive for frequency.  Musculoskeletal: Positive for myalgias and neck pain.  Skin: Negative.   Neurological: Negative.   Endo/Heme/Allergies: Negative.   Psychiatric/Behavioral: Negative.      Physical Exam:  oral temperature is 97.5 F (36.4 C) (abnormal). His blood pressure is 82/54 (abnormal) and his pulse is 94. His respiration is 18 and oxygen saturation is 99%.   Physical Exam Vitals signs reviewed.  HENT:     Head: Normocephalic and atraumatic.  Eyes:     Pupils: Pupils are equal, round, and reactive to light.  Neck:     Musculoskeletal: Normal range of motion.  Cardiovascular:     Rate and Rhythm: Normal rate and regular rhythm.     Heart sounds: Normal heart sounds.  Pulmonary:     Effort: Pulmonary effort is normal.     Breath sounds: Normal breath sounds.  Abdominal:     General: Bowel sounds are normal.     Palpations: Abdomen is soft.     Comments: He has a suprapubic catheter in place  Musculoskeletal: Normal range of motion.        General: No tenderness or deformity.     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  Skin:    General: Skin is warm and dry.     Findings: No erythema or rash.  Neurological:     Mental Status: He is alert and oriented to person, place, and time.  Psychiatric:        Behavior: Behavior normal.        Thought Content: Thought content normal.        Judgment:  Judgment normal.   .  Lab Results  Component Value Date   WBC 11.3 (H) 01/09/2018   HGB 13.8 12/05/2017   HCT 43.8 01/09/2018   MCV 86.4 01/09/2018   PLT 288 01/09/2018     Chemistry      Component Value Date/Time   NA 137 01/09/2018 1118   NA 137 12/05/2017 0840   NA 138 10/12/2016 0955   K 3.3 01/09/2018 1118   K 3.4 12/05/2017 0840   K 3.3 (L) 10/12/2016 0955   CL 104 01/09/2018 1118  CL 101 12/05/2017 0840   CO2 30 01/09/2018 1118   CO2 29 12/05/2017 0840   CO2 25 10/12/2016 0955   BUN 14 01/09/2018 1118   BUN 16 12/05/2017 0840   BUN 22.0 10/12/2016 0955   CREATININE 0.8 12/05/2017 0840   CREATININE 0.8 10/12/2016 0955      Component Value Date/Time   CALCIUM 8.8 01/09/2018 1118   CALCIUM 8.6 12/05/2017 0840   CALCIUM 9.0 10/12/2016 0955   ALKPHOS 101 (H) 01/09/2018 1118   ALKPHOS 139 (H) 12/05/2017 0840   ALKPHOS 159 (H) 10/12/2016 0955   AST 20 01/09/2018 1118   AST 45 (H) 10/12/2016 0955   ALT 12 01/09/2018 1118   ALT 19 12/05/2017 0840   ALT 29 10/12/2016 0955   BILITOT 0.8 01/09/2018 1118   BILITOT 0.62 10/12/2016 0955     Impression and Plan: Mr. Dunlevy is 58 year old gentleman with von Hippel-Lindau syndrome. He has multiple neuroendocrine tumors. 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 He's had multiple spinal surgeries.  Again, we have to get him to interventional radiology so that we can see about doing his Lutathera treatments.  I think he is only had one treatment to date.  I will get him back here in another month.  He will get his Somatuline today.  I offered to do a urinalysis and urine culture on him.  He did not think that he needed 1.   Volanda Napoleon, MD 2/4/20196:20 PM

## 2019-04-05 ENCOUNTER — Telehealth: Payer: Self-pay | Admitting: Hematology & Oncology

## 2019-04-05 LAB — LACTATE DEHYDROGENASE: LDH: 192 U/L (ref 98–192)

## 2019-04-05 LAB — FERRITIN: Ferritin: 185 ng/mL (ref 24–336)

## 2019-04-05 LAB — IRON AND TIBC
Iron: 52 ug/dL (ref 42–163)
Saturation Ratios: 20 % (ref 20–55)
TIBC: 268 ug/dL (ref 202–409)
UIBC: 215 ug/dL (ref 117–376)

## 2019-04-05 LAB — CHROMOGRANIN A: Chromogranin A (ng/mL): 50.2 ng/mL (ref 0.0–101.8)

## 2019-04-05 MED FILL — CREON DR 24,000 UNITS CAP: 24000-76000 | 30 days supply | Qty: 270 | Fill #2

## 2019-04-05 NOTE — Telephone Encounter (Signed)
sw [name] to confirm 5/27 appt at 230 pm per 4/29 LOS

## 2019-04-06 ENCOUNTER — Other Ambulatory Visit: Payer: Self-pay | Admitting: Hematology & Oncology

## 2019-04-06 MED FILL — DRONABINOL 5 MG CAP: 5 | 30 days supply | Qty: 60 | Fill #0

## 2019-04-12 ENCOUNTER — Other Ambulatory Visit: Payer: Self-pay | Admitting: Hematology & Oncology

## 2019-04-12 DIAGNOSIS — D500 Iron deficiency anemia secondary to blood loss (chronic): Secondary | ICD-10-CM

## 2019-04-12 DIAGNOSIS — C7A8 Other malignant neuroendocrine tumors: Secondary | ICD-10-CM

## 2019-04-12 MED FILL — BACLOFEN 20 MG TABS: 20 | 15 days supply | Qty: 120 | Fill #0

## 2019-04-18 DIAGNOSIS — Q858 Other phakomatoses, not elsewhere classified: Secondary | ICD-10-CM | POA: Diagnosis not present

## 2019-04-18 DIAGNOSIS — G8222 Paraplegia, incomplete: Secondary | ICD-10-CM | POA: Diagnosis not present

## 2019-04-19 MED FILL — traMADol HCL 50 MG TABS: 50 | 30 days supply | Qty: 90 | Fill #1

## 2019-04-19 MED FILL — SOLIFENACIN SUCCINATE 5 MG: 5 | 30 days supply | Qty: 30 | Fill #1

## 2019-04-27 MED FILL — GABAPENTIN 300 MG CAPSULE: 300 | 30 days supply | Qty: 180 | Fill #4

## 2019-05-02 ENCOUNTER — Inpatient Hospital Stay: Payer: 59

## 2019-05-02 ENCOUNTER — Encounter: Payer: Self-pay | Admitting: Hematology & Oncology

## 2019-05-02 ENCOUNTER — Other Ambulatory Visit: Payer: Self-pay

## 2019-05-02 ENCOUNTER — Inpatient Hospital Stay: Payer: 59 | Attending: Hematology & Oncology | Admitting: Hematology & Oncology

## 2019-05-02 DIAGNOSIS — N319 Neuromuscular dysfunction of bladder, unspecified: Secondary | ICD-10-CM

## 2019-05-02 DIAGNOSIS — Z79899 Other long term (current) drug therapy: Secondary | ICD-10-CM | POA: Insufficient documentation

## 2019-05-02 DIAGNOSIS — K922 Gastrointestinal hemorrhage, unspecified: Secondary | ICD-10-CM | POA: Diagnosis not present

## 2019-05-02 DIAGNOSIS — Z5111 Encounter for antineoplastic chemotherapy: Secondary | ICD-10-CM | POA: Diagnosis not present

## 2019-05-02 DIAGNOSIS — Q858 Other phakomatoses, not elsewhere classified: Secondary | ICD-10-CM | POA: Insufficient documentation

## 2019-05-02 DIAGNOSIS — C7A8 Other malignant neuroendocrine tumors: Secondary | ICD-10-CM

## 2019-05-02 DIAGNOSIS — D500 Iron deficiency anemia secondary to blood loss (chronic): Secondary | ICD-10-CM

## 2019-05-02 LAB — CMP (CANCER CENTER ONLY)
ALT: 20 U/L (ref 0–44)
AST: 29 U/L (ref 15–41)
Albumin: 3.5 g/dL (ref 3.5–5.0)
Alkaline Phosphatase: 117 U/L (ref 38–126)
Anion gap: 8 (ref 5–15)
BUN: 21 mg/dL — ABNORMAL HIGH (ref 6–20)
CO2: 28 mmol/L (ref 22–32)
Calcium: 8.3 mg/dL — ABNORMAL LOW (ref 8.9–10.3)
Chloride: 96 mmol/L — ABNORMAL LOW (ref 98–111)
Creatinine: 0.68 mg/dL (ref 0.61–1.24)
GFR, Est AFR Am: >60 mL/min (ref >60)
GFR, Estimated: >60 mL/min (ref >60)
Glucose, Bld: 276 mg/dL — ABNORMAL HIGH (ref 70–99)
Potassium: 4.4 mmol/L (ref 3.5–5.1)
Sodium: 132 mmol/L — ABNORMAL LOW (ref 135–145)
Total Bilirubin: 0.7 mg/dL (ref 0.3–1.2)
Total Protein: 6.9 g/dL (ref 6.5–8.1)

## 2019-05-02 LAB — CBC WITH DIFFERENTIAL (CANCER CENTER ONLY)
Abs Immature Granulocytes: 0.05 10*3/uL (ref 0.00–0.07)
Basophils Absolute: 0 10*3/uL (ref 0.0–0.1)
Basophils Relative: 0 %
Eosinophils Absolute: 0.1 10*3/uL (ref 0.0–0.5)
Eosinophils Relative: 1 %
HCT: 44.9 % (ref 39.0–52.0)
Hemoglobin: 14.2 g/dL (ref 13.0–17.0)
Immature Granulocytes: 0 %
Lymphocytes Relative: 8 %
Lymphs Abs: 0.9 10*3/uL (ref 0.7–4.0)
MCH: 28.2 pg (ref 26.0–34.0)
MCHC: 31.6 g/dL (ref 30.0–36.0)
MCV: 89.3 fL (ref 80.0–100.0)
Monocytes Absolute: 0.8 10*3/uL (ref 0.1–1.0)
Monocytes Relative: 7 %
Neutro Abs: 9.8 10*3/uL — ABNORMAL HIGH (ref 1.7–7.7)
Neutrophils Relative %: 84 %
Platelet Count: 214 10*3/uL (ref 150–400)
RBC: 5.03 MIL/uL (ref 4.22–5.81)
RDW: 12.8 % (ref 11.5–15.5)
WBC Count: 11.7 10*3/uL — ABNORMAL HIGH (ref 4.0–10.5)
nRBC: 0 % (ref 0.0–0.2)

## 2019-05-02 MED ORDER — LANREOTIDE ACETATE 120 MG/0.5ML ~~LOC~~ SOLN
SUBCUTANEOUS | Status: AC
  Filled 2019-05-02: qty 120

## 2019-05-02 MED ORDER — OCTREOTIDE ACETATE 30 MG IM KIT: 30.0000 mg | PACK | Freq: Once | INTRAMUSCULAR | Status: DC

## 2019-05-02 MED ORDER — PROMETHAZINE HCL 12.5 MG PO TABS: 12.5000 mg | ORAL_TABLET | Freq: Four times a day (QID) | ORAL | 6 refills | Status: AC | PRN

## 2019-05-02 MED ORDER — FENTANYL 12 MCG/HR TD PT72: 1.0000 | MEDICATED_PATCH | TRANSDERMAL | 0 refills | Status: DC

## 2019-05-02 MED ORDER — LANREOTIDE ACETATE 120 MG/0.5ML ~~LOC~~ SOLN
120.0000 mg | Freq: Once | SUBCUTANEOUS | Status: AC
  Administered 2019-05-02: 120 mg via SUBCUTANEOUS

## 2019-05-02 MED FILL — PROMETHAZINE 12.5 MG TABLET: 12.5 | 7 days supply | Qty: 30 | Fill #0

## 2019-05-02 MED FILL — FENTANYL 12 MCG/HR PT72: 12 | 30 days supply | Qty: 10 | Fill #0

## 2019-05-02 NOTE — Progress Notes (Signed)
Hematology and Oncology Follow Up Visit  [name] [id number] 05-30-1961 58 y.o. 01/09/2018   Principle Diagnosis:   Metastatic neuroendocrine carcinoma  Von Hipple-Lindau Syndrome  Intermittent iron deficiency anemia secondary to GI bleeding.  Current Therapy:  Temodar/Xeloda - start 03/17/2016 - s/p c#3 - discontinued Somatuline 120mg  SQ q month Lutathera -- cycle #1/4 on 07/04/2018 IV iron as indicated-Feraheme given on 11/19/2018     Interim History:  Mr.  [name] is back for followup.  Unfortunately, interventional radiology is still not gotten in touch with him regarding additional Lutathera therapy.  I am sure that their schedules have been altered significantly because of the coronavirus.  Mr. Stroder clearly needs to have Lutathera.  He needs to complete the series of injections.  His last CT scan was probably 4 months ago.  This is not going back up to the [facility] anytime soon, we will have to follow-up with the neuroendocrine carcinomas and also the renal cell carcinoma.  He has his new motorized wheelchair.  This is really fancy.  It really helps his quality of life.  He has not had any issues with fever.  He has had no dysuria.  He has a indwelling suprapubic catheter.  His blood sugars are under relatively decent control.  His last chromogranin A was 50.2 which is in the normal range.  His appetite is doing okay.  Overall, his performance status is ECOG 1.  Medications:  Current Outpatient Medications:    ACCU-CHEK SMARTVIEW test strip, , Disp: , Rfl: 10   baclofen (LIORESAL) 10 mg/mL SUSP, , Disp: , Rfl:    baclofen (LIORESAL) 20 MG tablet, , Disp: , Rfl:    Balsam [place]-Castor Oil (VENELEX) OINT, Apply topically as needed, Disp: 60 g, Rfl: 4   capecitabine (XELODA) 500 MG tablet, Take by mouth., Disp: , Rfl:    chlorproMAZINE (THORAZINE) 10 MG tablet, Take by mouth., Disp: , Rfl:    diazepam (VALIUM) 5 MG tablet, TAKE 1 TABLET BY MOUTH 3  TIMES DAILY FOR SPASMS/ANXIETY, Disp: 90 tablet, Rfl: 1   diphenoxylate-atropine (LOMOTIL) 2.5-0.025 MG tablet, TAKE 1 TABLET BY MOUTH 4 TIMES DAILY AS NEEDED FOR DIARRHEA OR LOOSE STOOLS, Disp: 100 tablet, Rfl: 0   doxazosin (CARDURA) 1 MG tablet, Take 1 tablet (1 mg total) by mouth 2 (two) times daily. (Patient taking differently: Take 1 mg by mouth 2 (two) times daily as needed (blood pressure spikes.). ), Disp: 60 tablet, Rfl: 2   dronabinol (MARINOL) 5 MG capsule, TAKE 1 CAPSULE BY MOUTH TWICE DAILY WITH MEALS, Disp: 60 capsule, Rfl: 3   gabapentin (NEURONTIN) 300 MG capsule, TAKE 2 CAPSULES BY MOUTH 3 TIMES DAILY., Disp: 180 capsule, Rfl: 6   HYDROcodone-acetaminophen (NORCO/VICODIN) 5-325 MG tablet, Take 1-2 tablets by mouth every 6 (six) hours as needed for moderate pain., Disp: 90 tablet, Rfl: 0   HYDROmorphone (DILAUDID) 4 MG tablet, Take 1 tablet (4 mg total) by mouth every 6 (six) hours as needed for severe pain., Disp: 40 tablet, Rfl: 0   insulin glargine (LANTUS) 100 UNIT/ML injection, Inject 31 Units into the skin daily before breakfast. , Disp: , Rfl:    insulin lispro (HUMALOG) 100 UNIT/ML injection, Inject 3-9 Units into the skin 3 (three) times daily before meals. SS, Disp: , Rfl:    lipase/protease/amylase (CREON) 12000 units CPEP capsule, Take by mouth., Disp: , Rfl:    midodrine (PROAMATINE) 5 MG tablet, TKAE 1/2 TABLET BY MOUTH TWICE A DAY AS NEEDED, Disp:  60 tablet, Rfl: 6   NON FORMULARY, Take by mouth., Disp: , Rfl:    oxybutynin (DITROPAN) 5 MG tablet, 5 mg 2 (two) times daily. , Disp: , Rfl: 3   promethazine (PHENERGAN) 12.5 MG tablet, Take 12.5 mg by mouth every 6 (six) hours as needed for nausea. , Disp: , Rfl:    tiZANidine (ZANAFLEX) 4 MG tablet, TAKE 2 TABLETS BY MOUTH EVERY 6 HOURS AS NEEDED FOR PAIN, Disp: 30 tablet, Rfl: 3   traMADol (ULTRAM) 50 MG tablet, TAKE 1 TABLET BY MOUTH 3 TIMES DAILY, Disp: 90 tablet, Rfl: 2  Allergies:  Allergies    Allergen Reactions   Citalopram Diarrhea   Ciprocin-Fluocin-Procin [Fluocinolone Acetonide] Rash    Pt states this happened with IV Cipro. ABLE TO TAKE PO CIPRO.   Other Rash    IV-CIPRO    Past Medical History, Surgical history, Social history, and Family History were reviewed and updated.  Review of Systems: Review of Systems  Constitutional: Negative.   HENT: Negative.   Eyes: Negative.   Respiratory: Negative.   Cardiovascular: Negative.   Gastrointestinal: Positive for diarrhea and nausea.  Genitourinary: Positive for frequency.  Musculoskeletal: Positive for myalgias and neck pain.  Skin: Negative.   Neurological: Negative.   Endo/Heme/Allergies: Negative.   Psychiatric/Behavioral: Negative.      Physical Exam:  oral temperature is 97.5 F (36.4 C) (abnormal). His blood pressure is 82/54 (abnormal) and his pulse is 94. His respiration is 18 and oxygen saturation is 99%.   Physical Exam Vitals signs reviewed.  HENT:     Head: Normocephalic and atraumatic.  Eyes:     Pupils: Pupils are equal, round, and reactive to light.  Neck:     Musculoskeletal: Normal range of motion.  Cardiovascular:     Rate and Rhythm: Normal rate and regular rhythm.     Heart sounds: Normal heart sounds.  Pulmonary:     Effort: Pulmonary effort is normal.     Breath sounds: Normal breath sounds.  Abdominal:     General: Bowel sounds are normal.     Palpations: Abdomen is soft.     Comments: He has a suprapubic catheter in place  Musculoskeletal: Normal range of motion.        General: No tenderness or deformity.     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  Skin:    General: Skin is warm and dry.     Findings: No erythema or rash.  Neurological:     Mental Status: He is alert and oriented to person, place, and time.  Psychiatric:        Behavior: Behavior normal.        Thought Content: Thought content normal.         Judgment: Judgment normal.   .  Lab Results  Component Value Date   WBC 11.3 (H) 01/09/2018   HGB 13.8 12/05/2017   HCT 43.8 01/09/2018   MCV 86.4 01/09/2018   PLT 288 01/09/2018     Chemistry      Component Value Date/Time   NA 137 01/09/2018 1118   NA 137 12/05/2017 0840   NA 138 10/12/2016 0955   K 3.3 01/09/2018 1118   K 3.4 12/05/2017 0840   K 3.3 (L) 10/12/2016 0955   CL 104 01/09/2018 1118   CL 101 12/05/2017 0840   CO2 30 01/09/2018 1118   CO2 29 12/05/2017 0840  CO2 25 10/12/2016 0955   BUN 14 01/09/2018 1118   BUN 16 12/05/2017 0840   BUN 22.0 10/12/2016 0955   CREATININE 0.8 12/05/2017 0840   CREATININE 0.8 10/12/2016 0955      Component Value Date/Time   CALCIUM 8.8 01/09/2018 1118   CALCIUM 8.6 12/05/2017 0840   CALCIUM 9.0 10/12/2016 0955   ALKPHOS 101 (H) 01/09/2018 1118   ALKPHOS 139 (H) 12/05/2017 0840   ALKPHOS 159 (H) 10/12/2016 0955   AST 20 01/09/2018 1118   AST 45 (H) 10/12/2016 0955   ALT 12 01/09/2018 1118   ALT 19 12/05/2017 0840   ALT 29 10/12/2016 0955   BILITOT 0.8 01/09/2018 1118   BILITOT 0.62 10/12/2016 0955     Impression and Plan: Mr. Lupinski is 58 year old gentleman with von Hippel-Lindau syndrome. He has multiple neuroendocrine tumors. 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 He's had multiple spinal surgeries.  I will try third time to get a hold of interventional radiology.  Hopefully, now that restrictions are not as severe, they might be able to see him and do the Regan.  We will have to get a CT scan we see him back.  I would need to see how he looks.  He does have a renal cell carcinoma.  We have to keep this in mind.  We may have to treat this with systemic therapy since I am not sure he will ever have surgery to remove the tumor.   Volanda Napoleon, MD 2/4/20196:20 PM

## 2019-05-02 NOTE — Patient Instructions (Signed)
Lanreotide injection What is this medicine? LANREOTIDE (lan REE oh tide) is used to reduce blood levels of growth hormone in patients with a condition called acromegaly. It also works to slow or stop tumor growth in patients with neuroendocrine tumors and treat carcinoid syndrome. This medicine may be used for other purposes; ask your health care provider or pharmacist if you have questions. COMMON BRAND NAME(S): Somatuline [place] What should I tell my health care provider before I take this medicine? They need to know if you have any of these conditions: -diabetes -gallbladder disease -heart disease -kidney disease -liver disease -thyroid disease -an unusual or allergic reaction to lanreotide, other medicines, foods, dyes, or preservatives -pregnant or trying to get pregnant -breast-feeding How should I use this medicine? This medicine is for injection under the skin. It is given by a health care professional in a hospital or clinic setting. Contact your pediatrician or health care professional regarding the use of this medicine in children. Special care may be needed. Overdosage: If you think you have taken too much of this medicine contact a poison control center or emergency room at once. NOTE: This medicine is only for you. Do not share this medicine with others. What if I miss a dose? It is important not to miss your dose. Call your doctor or health care professional if you are unable to keep an appointment. What may interact with this medicine? This medicine may interact with the following medications: -bromocriptine -cyclosporine -certain medicines for blood pressure, heart disease, irregular heart beat -certain medicines for diabetes -quinidine -terfenadine This list may not describe all possible interactions. Give your health care provider a list of all the medicines, herbs, non-prescription drugs, or dietary supplements you use. Also tell them if you smoke, drink alcohol, or  use illegal drugs. Some items may interact with your medicine. What should I watch for while using this medicine? Tell your doctor or healthcare professional if your symptoms do not start to get better or if they get worse. Visit your doctor or health care professional for regular checks on your progress. Your condition will be monitored carefully while you are receiving this medicine. You may need blood work done while you are taking this medicine. Women should inform their doctor if they wish to become pregnant or think they might be pregnant. There is a potential for serious side effects to an unborn child. Talk to your health care professional or pharmacist for more information. Do not breast-feed an infant while taking this medicine or for 6 months after stopping it. This medicine has caused ovarian failure in some women. This medicine may interfere with the ability to have a child. Talk with your doctor or health care professional if you are concerned about your fertility. What side effects may I notice from receiving this medicine? Side effects that you should report to your doctor or health care professional as soon as possible: -allergic reactions like skin rash, itching or hives, swelling of the face, lips, or tongue -increased blood pressure -severe stomach pain -signs and symptoms of high blood sugar such as dizziness; dry mouth; dry skin; fruity breath; nausea; stomach pain; increased hunger or thirst; increased urination -signs and symptoms of low blood sugar such as feeling anxious; confusion; dizziness; increased hunger; unusually weak or tired; sweating; shakiness; cold; irritable; headache; blurred vision; fast heartbeat; loss of consciousness -unusually slow heartbeat Side effects that usually do not require medical attention (report to your doctor or health care professional if they continue   anxious; confusion; dizziness; increased hunger; unusually weak or tired; sweating; shakiness; cold; irritable; headache; blurred vision; fast heartbeat; loss of consciousness  -unusually slow heartbeat  Side effects that usually do not require medical attention (report to your doctor or health care professional if they continue or are bothersome):  -constipation  -diarrhea  -dizziness  -headache  -muscle pain  -muscle  spasms  -nausea  -pain, redness, or irritation at site where injected  This list may not describe all possible side effects. Call your doctor for medical advice about side effects. You may report side effects to FDA at 1-800-FDA-1088.  Where should I keep my medicine?  This drug is given in a hospital or clinic and will not be stored at home.  NOTE: This sheet is a summary. It may not cover all possible information. If you have questions about this medicine, talk to your doctor, pharmacist, or health care provider.   2019 Elsevier/Gold Standard (2016-08-27 10:33:47)

## 2019-05-03 LAB — CHROMOGRANIN A: Chromogranin A (ng/mL): 70.7 ng/mL (ref 0.0–101.8)

## 2019-05-03 LAB — IRON AND TIBC
Iron: 71 ug/dL (ref 42–163)
Saturation Ratios: 28 % (ref 20–55)
TIBC: 250 ug/dL (ref 202–409)
UIBC: 180 ug/dL (ref 117–376)

## 2019-05-03 LAB — FERRITIN: Ferritin: 202 ng/mL (ref 24–336)

## 2019-05-03 MED FILL — CREON DR 24,000 UNITS CAP: 24000-76000 | 30 days supply | Qty: 270 | Fill #3

## 2019-05-09 MED FILL — DRONABINOL 5 MG CAP: 5 | 30 days supply | Qty: 60 | Fill #1

## 2019-05-09 MED FILL — LANTUS 100 UNITS/ML VIAL: 100 | 28 days supply | Qty: 10 | Fill #1

## 2019-05-13 ENCOUNTER — Encounter: Payer: Self-pay | Admitting: Hematology & Oncology

## 2019-05-16 ENCOUNTER — Ambulatory Visit (HOSPITAL_BASED_OUTPATIENT_CLINIC_OR_DEPARTMENT_OTHER)
Admission: RE | Admit: 2019-05-16 | Discharge: 2019-05-16 | Disposition: A | Discharge status: Home / Self Care | Payer: 59 | Source: Ambulatory Visit | Attending: Hematology & Oncology | Admitting: Hematology & Oncology

## 2019-05-16 ENCOUNTER — Ambulatory Visit (HOSPITAL_BASED_OUTPATIENT_CLINIC_OR_DEPARTMENT_OTHER): Payer: 59

## 2019-05-16 ENCOUNTER — Other Ambulatory Visit: Payer: Self-pay

## 2019-05-16 ENCOUNTER — Encounter (HOSPITAL_BASED_OUTPATIENT_CLINIC_OR_DEPARTMENT_OTHER): Payer: Self-pay

## 2019-05-16 DIAGNOSIS — C7A8 Other malignant neuroendocrine tumors: Secondary | ICD-10-CM | POA: Diagnosis not present

## 2019-05-16 DIAGNOSIS — C787 Secondary malignant neoplasm of liver and intrahepatic bile duct: Secondary | ICD-10-CM | POA: Diagnosis not present

## 2019-05-16 DIAGNOSIS — E109 Type 1 diabetes mellitus without complications: Secondary | ICD-10-CM | POA: Diagnosis not present

## 2019-05-16 MED ORDER — IOHEXOL 300 MG/ML  SOLN
100.0000 mL | Freq: Once | INTRAMUSCULAR | Status: AC | PRN
  Administered 2019-05-16: 100 mL via INTRAVENOUS

## 2019-05-18 MED FILL — traMADol HCL 50 MG TABS: 50 | 30 days supply | Qty: 90 | Fill #2

## 2019-05-18 MED FILL — SOLIFENACIN SUCCINATE 5 MG: 5 | 30 days supply | Qty: 30 | Fill #2

## 2019-05-22 ENCOUNTER — Other Ambulatory Visit (HOSPITAL_COMMUNITY): Payer: Self-pay | Admitting: Hematology & Oncology

## 2019-05-22 DIAGNOSIS — C7A8 Other malignant neuroendocrine tumors: Secondary | ICD-10-CM

## 2019-05-23 DIAGNOSIS — E109 Type 1 diabetes mellitus without complications: Secondary | ICD-10-CM | POA: Diagnosis not present

## 2019-05-23 DIAGNOSIS — Z794 Long term (current) use of insulin: Secondary | ICD-10-CM | POA: Diagnosis not present

## 2019-05-30 ENCOUNTER — Inpatient Hospital Stay: Payer: 59 | Admitting: Hematology & Oncology

## 2019-05-30 ENCOUNTER — Inpatient Hospital Stay: Payer: 59

## 2019-05-31 MED FILL — GABAPENTIN 300 MG CAPSULE: 300 | 30 days supply | Qty: 180 | Fill #5

## 2019-06-07 ENCOUNTER — Other Ambulatory Visit: Payer: Self-pay | Admitting: Hematology & Oncology

## 2019-06-07 MED FILL — CREON DR 24,000 UNITS CAP: 24000-76000 | 30 days supply | Qty: 270 | Fill #0

## 2019-06-11 MED FILL — DRONABINOL 5 MG CAP: 5 | 30 days supply | Qty: 60 | Fill #2

## 2019-06-12 ENCOUNTER — Ambulatory Visit (HOSPITAL_COMMUNITY): Payer: 59

## 2019-06-13 ENCOUNTER — Other Ambulatory Visit: Payer: Self-pay

## 2019-06-13 ENCOUNTER — Inpatient Hospital Stay: Payer: 59 | Attending: Hematology & Oncology

## 2019-06-13 ENCOUNTER — Encounter: Payer: Self-pay | Admitting: Hematology & Oncology

## 2019-06-13 ENCOUNTER — Inpatient Hospital Stay (HOSPITAL_BASED_OUTPATIENT_CLINIC_OR_DEPARTMENT_OTHER): Payer: 59 | Admitting: Hematology & Oncology

## 2019-06-13 ENCOUNTER — Inpatient Hospital Stay: Payer: 59

## 2019-06-13 VITALS — BP 114/78 | HR 64 | Temp 97.6°F | Resp 20 | Wt 160.0 lb

## 2019-06-13 DIAGNOSIS — N2889 Other specified disorders of kidney and ureter: Secondary | ICD-10-CM | POA: Diagnosis not present

## 2019-06-13 DIAGNOSIS — N319 Neuromuscular dysfunction of bladder, unspecified: Secondary | ICD-10-CM

## 2019-06-13 DIAGNOSIS — K922 Gastrointestinal hemorrhage, unspecified: Secondary | ICD-10-CM | POA: Diagnosis not present

## 2019-06-13 DIAGNOSIS — Q858 Other phakomatoses, not elsewhere classified: Secondary | ICD-10-CM | POA: Diagnosis not present

## 2019-06-13 DIAGNOSIS — C7A8 Other malignant neuroendocrine tumors: Secondary | ICD-10-CM

## 2019-06-13 DIAGNOSIS — Z79899 Other long term (current) drug therapy: Secondary | ICD-10-CM

## 2019-06-13 DIAGNOSIS — Q8583 Von Hippel-Lindau syndrome: Secondary | ICD-10-CM

## 2019-06-13 DIAGNOSIS — D500 Iron deficiency anemia secondary to blood loss (chronic): Secondary | ICD-10-CM | POA: Diagnosis not present

## 2019-06-13 DIAGNOSIS — Z794 Long term (current) use of insulin: Secondary | ICD-10-CM | POA: Diagnosis not present

## 2019-06-13 DIAGNOSIS — C642 Malignant neoplasm of left kidney, except renal pelvis: Secondary | ICD-10-CM | POA: Insufficient documentation

## 2019-06-13 DIAGNOSIS — Z7189 Other specified counseling: Secondary | ICD-10-CM | POA: Insufficient documentation

## 2019-06-13 HISTORY — DX: Malignant neoplasm of left kidney, except renal pelvis: C64.2

## 2019-06-13 HISTORY — DX: Other specified counseling: Z71.89

## 2019-06-13 LAB — CBC WITH DIFFERENTIAL (CANCER CENTER ONLY)
Abs Immature Granulocytes: 0.05 10*3/uL (ref 0.00–0.07)
Basophils Absolute: 0 10*3/uL (ref 0.0–0.1)
Basophils Relative: 0 %
Eosinophils Absolute: 0.1 10*3/uL (ref 0.0–0.5)
Eosinophils Relative: 1 %
HCT: 43.6 % (ref 39.0–52.0)
Hemoglobin: 13.6 g/dL (ref 13.0–17.0)
Immature Granulocytes: 1 %
Lymphocytes Relative: 14 %
Lymphs Abs: 1.2 10*3/uL (ref 0.7–4.0)
MCH: 27.6 pg (ref 26.0–34.0)
MCHC: 31.2 g/dL (ref 30.0–36.0)
MCV: 88.6 fL (ref 80.0–100.0)
Monocytes Absolute: 0.5 10*3/uL (ref 0.1–1.0)
Monocytes Relative: 7 %
Neutro Abs: 6.4 10*3/uL (ref 1.7–7.7)
Neutrophils Relative %: 77 %
Platelet Count: 231 10*3/uL (ref 150–400)
RBC: 4.92 MIL/uL (ref 4.22–5.81)
RDW: 12.5 % (ref 11.5–15.5)
WBC Count: 8.3 10*3/uL (ref 4.0–10.5)
nRBC: 0 % (ref 0.0–0.2)

## 2019-06-13 LAB — CMP (CANCER CENTER ONLY)
ALT: 16 U/L (ref 0–44)
AST: 27 U/L (ref 15–41)
Albumin: 3.4 g/dL — ABNORMAL LOW (ref 3.5–5.0)
Alkaline Phosphatase: 123 U/L (ref 38–126)
Anion gap: 6 (ref 5–15)
BUN: 17 mg/dL (ref 6–20)
CO2: 29 mmol/L (ref 22–32)
Calcium: 8 mg/dL — ABNORMAL LOW (ref 8.9–10.3)
Chloride: 100 mmol/L (ref 98–111)
Creatinine: 0.57 mg/dL — ABNORMAL LOW (ref 0.61–1.24)
GFR, Est AFR Am: >60 mL/min (ref >60)
GFR, Estimated: >60 mL/min (ref >60)
Glucose, Bld: 143 mg/dL — ABNORMAL HIGH (ref 70–99)
Potassium: 4.1 mmol/L (ref 3.5–5.1)
Sodium: 135 mmol/L (ref 135–145)
Total Bilirubin: 0.5 mg/dL (ref 0.3–1.2)
Total Protein: 6.6 g/dL (ref 6.5–8.1)

## 2019-06-13 MED ORDER — OCTREOTIDE ACETATE 30 MG IM KIT: 30.0000 mg | PACK | Freq: Once | INTRAMUSCULAR | Status: DC

## 2019-06-13 MED ORDER — LANREOTIDE ACETATE 120 MG/0.5ML ~~LOC~~ SOLN
SUBCUTANEOUS | Status: AC
  Filled 2019-06-13: qty 120

## 2019-06-13 MED ORDER — LANREOTIDE ACETATE 120 MG/0.5ML ~~LOC~~ SOLN
120.0000 mg | Freq: Once | SUBCUTANEOUS | Status: AC
  Administered 2019-06-13: 12:00:00 120 mg via SUBCUTANEOUS

## 2019-06-13 NOTE — Progress Notes (Signed)
Hematology and Oncology Follow Up Visit  [name] [id number] 12-Sep-1961 58 y.o. 01/09/2018   Principle Diagnosis:   Metastatic neuroendocrine carcinoma  Von Hipple-Lindau Syndrome  Intermittent iron deficiency anemia secondary to GI bleeding.  Current Therapy:  Temodar/Xeloda - start 03/17/2016 - s/p c#3 - discontinued Somatuline 120mg  SQ q month Lutathera -- cycle #1/4 on 07/04/2018 IV iron as indicated-Feraheme given on 11/19/2018     Interim History:  [name] is back for followup.  He looks quite good.  He feels okay.  We had a recent CT scan done on him.  The CT scan did show that the masses in the left kidney are enlarging.  He has a left lower kidney mass measuring 5.2 x 6.9 cm.  An upper left kidney mass measures 3.9 x 4 cm.  He has other lesions elsewhere.  He has the neuroendocrine tumor in his liver which appear to be progressing.  He is going to start his Lutathera treatments I think in a week or so.  This certainly would be reasonable.  I do think that it would be a good idea to try to biopsy the left kidney mass.  We can get molecular studies.  Since it is apparent he is not going to be operated on for the kidney cancer, we will have to see about systemic therapy.  His Foley catheter in the bladder is causing problems.  He does have a lot of irritation with this.  On occasion, he does get IV iron.  Today, his ferritin was 114 with an iron saturation of 16%.  We probably will have to give him another dose of iron.  His last chromogranin A level was 70.7.  This is trending upward.  His appetite is doing okay.  Blood sugars have been monitored closely.  His wife is doing a great job with him.  As always, he denied talk about a lot of different items.  He is incredibly well versed about many different subjects.   Overall, his performance status is ECOG 1.  Medications:  Current Outpatient Medications:  .  ACCU-CHEK SMARTVIEW test strip, , Disp: ,  Rfl: 10 .  baclofen (LIORESAL) 10 mg/mL SUSP, , Disp: , Rfl:  .  baclofen (LIORESAL) 20 MG tablet, , Disp: , Rfl:  .  Balsam [place]-Castor Oil (VENELEX) OINT, Apply topically as needed, Disp: 60 g, Rfl: 4 .  capecitabine (XELODA) 500 MG tablet, Take by mouth., Disp: , Rfl:  .  chlorproMAZINE (THORAZINE) 10 MG tablet, Take by mouth., Disp: , Rfl:  .  diazepam (VALIUM) 5 MG tablet, TAKE 1 TABLET BY MOUTH 3 TIMES DAILY FOR SPASMS/ANXIETY, Disp: 90 tablet, Rfl: 1 .  diphenoxylate-atropine (LOMOTIL) 2.5-0.025 MG tablet, TAKE 1 TABLET BY MOUTH 4 TIMES DAILY AS NEEDED FOR DIARRHEA OR LOOSE STOOLS, Disp: 100 tablet, Rfl: 0 .  doxazosin (CARDURA) 1 MG tablet, Take 1 tablet (1 mg total) by mouth 2 (two) times daily. (Patient taking differently: Take 1 mg by mouth 2 (two) times daily as needed (blood pressure spikes.). ), Disp: 60 tablet, Rfl: 2 .  dronabinol (MARINOL) 5 MG capsule, TAKE 1 CAPSULE BY MOUTH TWICE DAILY WITH MEALS, Disp: 60 capsule, Rfl: 3 .  gabapentin (NEURONTIN) 300 MG capsule, TAKE 2 CAPSULES BY MOUTH 3 TIMES DAILY., Disp: 180 capsule, Rfl: 6 .  HYDROcodone-acetaminophen (NORCO/VICODIN) 5-325 MG tablet, Take 1-2 tablets by mouth every 6 (six) hours as needed for moderate pain., Disp: 90 tablet, Rfl: 0 .  HYDROmorphone (  DILAUDID) 4 MG tablet, Take 1 tablet (4 mg total) by mouth every 6 (six) hours as needed for severe pain., Disp: 40 tablet, Rfl: 0 .  insulin glargine (LANTUS) 100 UNIT/ML injection, Inject 31 Units into the skin daily before breakfast. , Disp: , Rfl:  .  insulin lispro (HUMALOG) 100 UNIT/ML injection, Inject 3-9 Units into the skin 3 (three) times daily before meals. SS, Disp: , Rfl:  .  lipase/protease/amylase (CREON) 12000 units CPEP capsule, Take by mouth., Disp: , Rfl:  .  midodrine (PROAMATINE) 5 MG tablet, TKAE 1/2 TABLET BY MOUTH TWICE A DAY AS NEEDED, Disp: 60 tablet, Rfl: 6 .  NON FORMULARY, Take by mouth., Disp: , Rfl:  .  oxybutynin (DITROPAN) 5 MG tablet, 5 mg 2  (two) times daily. , Disp: , Rfl: 3 .  promethazine (PHENERGAN) 12.5 MG tablet, Take 12.5 mg by mouth every 6 (six) hours as needed for nausea. , Disp: , Rfl:  .  tiZANidine (ZANAFLEX) 4 MG tablet, TAKE 2 TABLETS BY MOUTH EVERY 6 HOURS AS NEEDED FOR PAIN, Disp: 30 tablet, Rfl: 3 .  traMADol (ULTRAM) 50 MG tablet, TAKE 1 TABLET BY MOUTH 3 TIMES DAILY, Disp: 90 tablet, Rfl: 2  Allergies:  Allergies  Allergen Reactions  . Citalopram Diarrhea  . Ciprocin-Fluocin-Procin [Fluocinolone Acetonide] Rash    Pt states this happened with IV Cipro. ABLE TO TAKE PO CIPRO.  . Other Rash    IV-CIPRO    Past Medical History, Surgical history, Social history, and Family History were reviewed and updated.  Review of Systems: Review of Systems  Constitutional: Negative.   HENT: Negative.   Eyes: Negative.   Respiratory: Negative.   Cardiovascular: Negative.   Gastrointestinal: Positive for diarrhea and nausea.  Genitourinary: Positive for frequency.  Musculoskeletal: Positive for myalgias and neck pain.  Skin: Negative.   Neurological: Negative.   Endo/Heme/Allergies: Negative.   Psychiatric/Behavioral: Negative.      Physical Exam:  oral temperature is 97.5 F (36.4 C) (abnormal). His blood pressure is 82/54 (abnormal) and his pulse is 94. His respiration is 18 and oxygen saturation is 99%.   Physical Exam Vitals signs reviewed.  HENT:     Head: Normocephalic and atraumatic.  Eyes:     Pupils: Pupils are equal, round, and reactive to light.  Neck:     Musculoskeletal: Normal range of motion.  Cardiovascular:     Rate and Rhythm: Normal rate and regular rhythm.     Heart sounds: Normal heart sounds.  Pulmonary:     Effort: Pulmonary effort is normal.     Breath sounds: Normal breath sounds.  Abdominal:     General: Bowel sounds are normal.     Palpations: Abdomen is soft.     Comments: He has a suprapubic catheter in place  Musculoskeletal: Normal range of motion.        General:  No tenderness or deformity.     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  Skin:    General: Skin is warm and dry.     Findings: No erythema or rash.  Neurological:     Mental Status: He is alert and oriented to person, place, and time.  Psychiatric:        Behavior: Behavior normal.        Thought Content: Thought content normal.        Judgment: Judgment normal.   .  Lab Results  Component Value Date   WBC 11.3 (H) 01/09/2018   HGB 13.8 12/05/2017   HCT 43.8 01/09/2018   MCV 86.4 01/09/2018   PLT 288 01/09/2018     Chemistry      Component Value Date/Time   NA 137 01/09/2018 1118   NA 137 12/05/2017 0840   NA 138 10/12/2016 0955   K 3.3 01/09/2018 1118   K 3.4 12/05/2017 0840   K 3.3 (L) 10/12/2016 0955   CL 104 01/09/2018 1118   CL 101 12/05/2017 0840   CO2 30 01/09/2018 1118   CO2 29 12/05/2017 0840   CO2 25 10/12/2016 0955   BUN 14 01/09/2018 1118   BUN 16 12/05/2017 0840   BUN 22.0 10/12/2016 0955   CREATININE 0.8 12/05/2017 0840   CREATININE 0.8 10/12/2016 0955      Component Value Date/Time   CALCIUM 8.8 01/09/2018 1118   CALCIUM 8.6 12/05/2017 0840   CALCIUM 9.0 10/12/2016 0955   ALKPHOS 101 (H) 01/09/2018 1118   ALKPHOS 139 (H) 12/05/2017 0840   ALKPHOS 159 (H) 10/12/2016 0955   AST 20 01/09/2018 1118   AST 45 (H) 10/12/2016 0955   ALT 12 01/09/2018 1118   ALT 19 12/05/2017 0840   ALT 29 10/12/2016 0955   BILITOT 0.8 01/09/2018 1118   BILITOT 0.62 10/12/2016 0955     Impression and Plan: Mr. Parkison is 58 year old gentleman with von Hippel-Lindau syndrome. He has multiple neuroendocrine tumors. 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 He's had multiple spinal surgeries.  We will see about getting the biopsy of the left kidney tumor in a week.  I think it would be interesting to see what the molecular profile is.  We know he has the von Hippel-Lindau gene.   It be nice to see what other aspects of his malignancy we might be able to target.  I would probably would seriously consider immunotherapy for this tumor.  I think this would be fairly well tolerated and fairly effective.  I spent about 40 minutes with him.  It sort is pretty complicated given his genetic issue that he has with the Von Hippel Landau syndrome.  He has 2 different malignancies.  He did get his Somatuline.    We will have him come back in another month.  At that time, we should have all the results back from our biopsy and molecular studies.Marland Kitchen   Volanda Napoleon, MD 2/4/20196:20 PM

## 2019-06-13 NOTE — Patient Instructions (Signed)
Lanreotide injection What is this medicine? LANREOTIDE (lan REE oh tide) is used to reduce blood levels of growth hormone in patients with a condition called acromegaly. It also works to slow or stop tumor growth in patients with neuroendocrine tumors and treat carcinoid syndrome. This medicine may be used for other purposes; ask your health care provider or pharmacist if you have questions. COMMON BRAND NAME(S): Somatuline [place] What should I tell my health care provider before I take this medicine? They need to know if you have any of these conditions:  diabetes  gallbladder disease  heart disease  kidney disease  liver disease  thyroid disease  an unusual or allergic reaction to lanreotide, other medicines, foods, dyes, or preservatives  pregnant or trying to get pregnant  breast-feeding How should I use this medicine? This medicine is for injection under the skin. It is given by a health care professional in a hospital or clinic setting. Contact your pediatrician or health care professional regarding the use of this medicine in children. Special care may be needed. Overdosage: If you think you have taken too much of this medicine contact a poison control center or emergency room at once. NOTE: This medicine is only for you. Do not share this medicine with others. What if I miss a dose? It is important not to miss your dose. Call your doctor or health care professional if you are unable to keep an appointment. What may interact with this medicine? This medicine may interact with the following medications:  bromocriptine  cyclosporine  certain medicines for blood pressure, heart disease, irregular heart beat  certain medicines for diabetes  quinidine  terfenadine This list may not describe all possible interactions. Give your health care provider a list of all the medicines, herbs, non-prescription drugs, or dietary supplements you use. Also tell them if you smoke,  drink alcohol, or use illegal drugs. Some items may interact with your medicine. What should I watch for while using this medicine? Tell your doctor or healthcare professional if your symptoms do not start to get better or if they get worse. Visit your doctor or health care professional for regular checks on your progress. Your condition will be monitored carefully while you are receiving this medicine. This medicine may increase blood sugar. Ask your healthcare provider if changes in diet or medicines are needed if you have diabetes. You may need blood work done while you are taking this medicine. Women should inform their doctor if they wish to become pregnant or think they might be pregnant. There is a potential for serious side effects to an unborn child. Talk to your health care professional or pharmacist for more information. Do not breast-feed an infant while taking this medicine or for 6 months after stopping it. This medicine has caused ovarian failure in some women. This medicine may interfere with the ability to have a child. Talk with your doctor or health care professional if you are concerned about your fertility. What side effects may I notice from receiving this medicine? Side effects that you should report to your doctor or health care professional as soon as possible:  allergic reactions like skin rash, itching or hives, swelling of the face, lips, or tongue  increased blood pressure  severe stomach pain  signs and symptoms of hgh blood sugar such as being more thirsty or hungry or having to urinate more than normal. You may also feel very tired or have blurry vision.  signs and symptoms of low blood   sugar such as feeling anxious; confusion; dizziness; increased hunger; unusually weak or tired; sweating; shakiness; cold; irritable; headache; blurred vision; fast heartbeat; loss of consciousness  unusually slow heartbeat Side effects that usually do not require medical  attention (report to your doctor or health care professional if they continue or are bothersome):  constipation  diarrhea  dizziness  headache  muscle pain  muscle spasms  nausea  pain, redness, or irritation at site where injected This list may not describe all possible side effects. Call your doctor for medical advice about side effects. You may report side effects to FDA at 1-800-FDA-1088. Where should I keep my medicine? This drug is given in a hospital or clinic and will not be stored at home. NOTE: This sheet is a summary. It may not cover all possible information. If you have questions about this medicine, talk to your doctor, pharmacist, or health care provider.  2020 Elsevier/Gold Standard (2018-08-31 09:13:08)  

## 2019-06-14 ENCOUNTER — Telehealth: Payer: Self-pay | Admitting: Hematology & Oncology

## 2019-06-14 ENCOUNTER — Other Ambulatory Visit: Payer: Self-pay | Admitting: Family

## 2019-06-14 DIAGNOSIS — D500 Iron deficiency anemia secondary to blood loss (chronic): Secondary | ICD-10-CM

## 2019-06-14 DIAGNOSIS — C7A8 Other malignant neuroendocrine tumors: Secondary | ICD-10-CM

## 2019-06-14 DIAGNOSIS — Q858 Other phakomatoses, not elsewhere classified: Secondary | ICD-10-CM

## 2019-06-14 DIAGNOSIS — N319 Neuromuscular dysfunction of bladder, unspecified: Secondary | ICD-10-CM

## 2019-06-14 DIAGNOSIS — Q8583 Von Hippel-Lindau syndrome: Secondary | ICD-10-CM

## 2019-06-14 LAB — IRON AND TIBC
Iron: 45 ug/dL (ref 42–163)
Saturation Ratios: 16 % — ABNORMAL LOW (ref 20–55)
TIBC: 288 ug/dL (ref 202–409)
UIBC: 243 ug/dL (ref 117–376)

## 2019-06-14 LAB — FERRITIN: Ferritin: 114 ng/mL (ref 24–336)

## 2019-06-14 LAB — LACTATE DEHYDROGENASE: LDH: 163 U/L (ref 98–192)

## 2019-06-14 MED FILL — traMADol HCL 50 MG TABS: 50 | 30 days supply | Qty: 90 | Fill #0

## 2019-06-14 MED FILL — BACLOFEN 20 MG TABLET: 20 | 15 days supply | Qty: 120 | Fill #1

## 2019-06-14 NOTE — Telephone Encounter (Signed)
Called spoke with wife regarding appointment added to his schedule.  She was ok with date/time per 7/9 result note

## 2019-06-14 NOTE — Telephone Encounter (Signed)
No LOS 7/8 °

## 2019-06-15 LAB — CHROMOGRANIN A: Chromogranin A (ng/mL): 64.6 ng/mL (ref 0.0–101.8)

## 2019-06-18 ENCOUNTER — Other Ambulatory Visit: Payer: Self-pay

## 2019-06-18 ENCOUNTER — Inpatient Hospital Stay: Payer: 59

## 2019-06-18 VITALS — BP 154/92 | HR 69 | Temp 97.1°F | Resp 17

## 2019-06-18 DIAGNOSIS — Q858 Other phakomatoses, not elsewhere classified: Secondary | ICD-10-CM | POA: Diagnosis not present

## 2019-06-18 DIAGNOSIS — Z794 Long term (current) use of insulin: Secondary | ICD-10-CM | POA: Diagnosis not present

## 2019-06-18 DIAGNOSIS — K922 Gastrointestinal hemorrhage, unspecified: Secondary | ICD-10-CM | POA: Diagnosis not present

## 2019-06-18 DIAGNOSIS — D500 Iron deficiency anemia secondary to blood loss (chronic): Secondary | ICD-10-CM

## 2019-06-18 DIAGNOSIS — N2889 Other specified disorders of kidney and ureter: Secondary | ICD-10-CM | POA: Diagnosis not present

## 2019-06-18 DIAGNOSIS — Z79899 Other long term (current) drug therapy: Secondary | ICD-10-CM | POA: Diagnosis not present

## 2019-06-18 DIAGNOSIS — C7A8 Other malignant neuroendocrine tumors: Secondary | ICD-10-CM | POA: Diagnosis not present

## 2019-06-18 MED ORDER — SODIUM CHLORIDE 0.9 % IV SOLN
510.0000 mg | Freq: Once | INTRAVENOUS | Status: AC
  Administered 2019-06-18: 510 mg via INTRAVENOUS
  Filled 2019-06-18: qty 510

## 2019-06-18 MED ORDER — SODIUM CHLORIDE 0.9 % IV SOLN
INTRAVENOUS | Status: DC
  Administered 2019-06-18: 14:00:00 via INTRAVENOUS
  Filled 2019-06-18: qty 250

## 2019-06-18 NOTE — Patient Instructions (Signed)

## 2019-06-19 ENCOUNTER — Other Ambulatory Visit: Payer: Self-pay | Admitting: Family

## 2019-06-19 ENCOUNTER — Other Ambulatory Visit: Payer: Self-pay | Admitting: Hematology & Oncology

## 2019-06-19 DIAGNOSIS — N2889 Other specified disorders of kidney and ureter: Secondary | ICD-10-CM

## 2019-06-20 ENCOUNTER — Other Ambulatory Visit: Payer: Self-pay

## 2019-06-20 ENCOUNTER — Ambulatory Visit (HOSPITAL_COMMUNITY)
Admission: RE | Admit: 2019-06-20 | Discharge: 2019-06-20 | Disposition: A | Discharge status: Home / Self Care | Payer: 59 | Source: Ambulatory Visit | Attending: Hematology & Oncology | Admitting: Hematology & Oncology

## 2019-06-20 DIAGNOSIS — C7A098 Malignant carcinoid tumors of other sites: Secondary | ICD-10-CM | POA: Diagnosis not present

## 2019-06-20 DIAGNOSIS — C7A8 Other malignant neuroendocrine tumors: Secondary | ICD-10-CM | POA: Diagnosis not present

## 2019-06-20 LAB — CBC WITH DIFFERENTIAL/PLATELET
Abs Immature Granulocytes: 0.02 10*3/uL (ref 0.00–0.07)
Basophils Absolute: 0 10*3/uL (ref 0.0–0.1)
Basophils Relative: 1 %
Eosinophils Absolute: 0.1 10*3/uL (ref 0.0–0.5)
Eosinophils Relative: 2 %
HCT: 44.2 % (ref 39.0–52.0)
Hemoglobin: 13.5 g/dL (ref 13.0–17.0)
Immature Granulocytes: 0 %
Lymphocytes Relative: 15 %
Lymphs Abs: 0.9 10*3/uL (ref 0.7–4.0)
MCH: 27.2 pg (ref 26.0–34.0)
MCHC: 30.5 g/dL (ref 30.0–36.0)
MCV: 88.9 fL (ref 80.0–100.0)
Monocytes Absolute: 0.6 10*3/uL (ref 0.1–1.0)
Monocytes Relative: 9 %
Neutro Abs: 4.7 10*3/uL (ref 1.7–7.7)
Neutrophils Relative %: 73 %
Platelets: 205 10*3/uL (ref 150–400)
RBC: 4.97 MIL/uL (ref 4.22–5.81)
RDW: 12.7 % (ref 11.5–15.5)
WBC: 6.3 10*3/uL (ref 4.0–10.5)
nRBC: 0 % (ref 0.0–0.2)

## 2019-06-20 LAB — COMPREHENSIVE METABOLIC PANEL
ALT: 18 U/L (ref 0–44)
AST: 26 U/L (ref 15–41)
Albumin: 3.2 g/dL — ABNORMAL LOW (ref 3.5–5.0)
Alkaline Phosphatase: 131 U/L — ABNORMAL HIGH (ref 38–126)
Anion gap: 12 (ref 5–15)
BUN: 18 mg/dL (ref 6–20)
CO2: 26 mmol/L (ref 22–32)
Calcium: 8.8 mg/dL — ABNORMAL LOW (ref 8.9–10.3)
Chloride: 101 mmol/L (ref 98–111)
Creatinine, Ser: 0.62 mg/dL (ref 0.61–1.24)
GFR calc Af Amer: >60 mL/min (ref >60)
GFR calc non Af Amer: >60 mL/min (ref >60)
Glucose, Bld: 155 mg/dL — ABNORMAL HIGH (ref 70–99)
Potassium: 4.2 mmol/L (ref 3.5–5.1)
Sodium: 139 mmol/L (ref 135–145)
Total Bilirubin: 0.5 mg/dL (ref 0.3–1.2)
Total Protein: 6.9 g/dL (ref 6.5–8.1)

## 2019-06-20 MED ORDER — ONDANSETRON HCL 8 MG PO TABS: 8.0000 mg | ORAL_TABLET | Freq: Three times a day (TID) | ORAL | Status: DC | PRN

## 2019-06-20 MED ORDER — AMINO ACID RADIOPROTECTANT - L-LYSINE 2.5%/L-ARGININE 2.5% IN NS
250.0000 mL/h | INTRAVENOUS | Status: AC
  Administered 2019-06-20: 250 mL/h via INTRAVENOUS
  Filled 2019-06-20: qty 1000

## 2019-06-20 MED ORDER — OCTREOTIDE ACETATE 30 MG IM KIT
PACK | INTRAMUSCULAR | Status: AC
  Filled 2019-06-20: qty 1

## 2019-06-20 MED ORDER — SODIUM CHLORIDE 0.9 % IV SOLN: 500.0000 mL | Freq: Once | INTRAVENOUS | Status: DC

## 2019-06-20 MED ORDER — LUTETIUM LU 177 DOTATATE 370 MBQ/ML IV SOLN
200.0000 | Freq: Once | INTRAVENOUS | Status: AC
  Administered 2019-06-20: 200 via INTRAVENOUS

## 2019-06-20 MED ORDER — PROCHLORPERAZINE EDISYLATE 10 MG/2ML IJ SOLN
10.0000 mg | Freq: Four times a day (QID) | INTRAMUSCULAR | Status: DC | PRN
  Filled 2019-06-20: qty 2

## 2019-06-20 MED ORDER — OCTREOTIDE ACETATE 500 MCG/ML IJ SOLN: 500.0000 ug | Freq: Once | INTRAMUSCULAR | Status: DC | PRN

## 2019-06-20 MED ORDER — ONDANSETRON HCL 8 MG PO TABS: 8.0000 mg | ORAL_TABLET | Freq: Two times a day (BID) | ORAL | 0 refills | Status: DC | PRN

## 2019-06-20 MED ORDER — SODIUM CHLORIDE 0.9 % IV SOLN
8.0000 mg | Freq: Once | INTRAVENOUS | Status: AC
  Administered 2019-06-20: 8 mg via INTRAVENOUS
  Filled 2019-06-20: qty 4

## 2019-06-20 MED ORDER — OCTREOTIDE ACETATE 500 MCG/ML IJ SOLN
INTRAMUSCULAR | Status: AC
  Filled 2019-06-20: qty 1

## 2019-06-20 MED ORDER — ONDANSETRON HCL 4 MG PO TABS
8.0000 mg | ORAL_TABLET | Freq: Three times a day (TID) | ORAL | Status: DC | PRN
  Filled 2019-06-20: qty 2

## 2019-06-20 MED ORDER — OCTREOTIDE ACETATE 30 MG IM KIT
30.0000 mg | PACK | Freq: Once | INTRAMUSCULAR | Status: AC
  Administered 2019-06-20: 30 mg via INTRAMUSCULAR

## 2019-06-20 NOTE — ED Notes (Signed)
Pt tolerated Lutathera Treatment without nausea, vomiting or complications. Urine contained in foley catheter and emptied by this RN.

## 2019-06-20 NOTE — Progress Notes (Signed)
CLINICAL DATA: [age] year-old [male] with metastatic neuroendocrine tumor. Well differentiated tumor with somatostatin receptor is identified within the [liver] by DOTATATE PET CT scan.  History of von Hippel-Lindau syndrome with renal cell carcinoma and hemangioma blastoma is as well as metastatic well-differentiated neuroendocrine tumor.  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  In the interval COVID-19 pandemic and delay of re-initiation.  EXAM: NUCLEAR MEDICINE LUTATHERA ADMINISTRATION TECHNIQUE: Infusion: The nuclear medicine technologist and I personally verified the dose activity ([204] mCi) to be delivered as specified in the written directive (200 mCi), and verified the patient identification via 2 separate methods. 20 gauge IV were started in the antecubital veins. Anti-emetics were administered by nursing staff. Amino acid renal protection was initiated 30 minutes prior to Lu 177 DOTATATE (Lutathera) infusion and continued continuously for 4 hours. Lutathera infusion was administered over 30 minutes.   The total administered dose was [201.9] mCi Lu 177 DOTATATE.  The entire IV tubing, venocatheter, stopcock and syringes was removed in total, placed in a disposal bag and sent for assay of the residual activity, which will be reported at a later time in our EMR by the physics staff. Pressure was applied to the venipuncture sites, and a compression bandage placed. Radiation Safety personnel were present to perform the discharge survey, as detailed on their documentation.  Patient received 30 mg IM long-acting Sandostatin injection 4 hours after Lutathera effusion in the nuclear medicine department.  RADIOPHARMACEUTICALS:  [201.9] mCi Lu 177 DOTATATE  FINDINGS: Diagnosis: [Metastatic neuroendocrine tumor.]  Current Infusion: [1]  Planned  Infusions: [4]  Patient reports [mild] interval symptoms following first therapy.  Some liver discomfort.  No nausea the patient's most recent blood counts were reviewed and remains a good candidate to proceed with Lutathera. The patient was situated in an infusion suite and administered Lutathera as above. Patient will follow-up with referring oncologist for interval serum laboratories (CBC and CMP) in approximately 4 weeks.    Patient received 30 mg IM long-acting Sandostatin injection 4 hours after Lutathera effusion in the nuclear medicine department.   IMPRESSION: [First] XT [id number] [id number] treatment for metastatic neuroendocrine tumor. The patient tolerated the infusion well. The patient will return in 8 weeks for ongoing care.

## 2019-06-21 MED FILL — LANTUS 100 UNITS/ML VIAL: 100 | 28 days supply | Qty: 10 | Fill #2

## 2019-06-21 MED FILL — SOLIFENACIN SUCCINATE 5 MG: 5 | 30 days supply | Qty: 30 | Fill #3

## 2019-06-22 ENCOUNTER — Other Ambulatory Visit: Payer: Self-pay | Admitting: Hematology & Oncology

## 2019-06-22 MED FILL — FENTANYL 12 MCG/HR PT72: 12 | 30 days supply | Qty: 10 | Fill #0

## 2019-06-25 ENCOUNTER — Other Ambulatory Visit: Payer: Self-pay | Admitting: Hematology & Oncology

## 2019-06-25 DIAGNOSIS — C642 Malignant neoplasm of left kidney, except renal pelvis: Secondary | ICD-10-CM

## 2019-06-26 ENCOUNTER — Ambulatory Visit (HOSPITAL_COMMUNITY): Payer: 59

## 2019-06-28 MED FILL — GABAPENTIN 300 MG CAPSULE: 300 | 30 days supply | Qty: 180 | Fill #6

## 2019-06-29 ENCOUNTER — Other Ambulatory Visit: Payer: Self-pay | Admitting: Hematology & Oncology

## 2019-06-29 DIAGNOSIS — N319 Neuromuscular dysfunction of bladder, unspecified: Secondary | ICD-10-CM

## 2019-06-29 MED FILL — tiZANidine HCL 4 MG TABS: 4 | 4 days supply | Qty: 30 | Fill #0

## 2019-07-02 ENCOUNTER — Other Ambulatory Visit: Payer: Self-pay | Admitting: *Deleted

## 2019-07-02 DIAGNOSIS — C7A8 Other malignant neuroendocrine tumors: Secondary | ICD-10-CM

## 2019-07-02 DIAGNOSIS — N390 Urinary tract infection, site not specified: Secondary | ICD-10-CM

## 2019-07-02 MED ORDER — HYDROMORPHONE HCL 4 MG PO TABS: 4.0000 mg | ORAL_TABLET | Freq: Four times a day (QID) | ORAL | 0 refills | Status: DC | PRN

## 2019-07-02 MED FILL — HYDROmorphone HCL 4 MG TABS: 4 | 15 days supply | Qty: 60 | Fill #0

## 2019-07-03 ENCOUNTER — Other Ambulatory Visit: Payer: Self-pay | Admitting: Radiology

## 2019-07-04 ENCOUNTER — Ambulatory Visit (HOSPITAL_COMMUNITY)
Admission: RE | Admit: 2019-07-04 | Discharge: 2019-07-04 | Disposition: A | Discharge status: Home / Self Care | Payer: 59 | Source: Ambulatory Visit | Attending: Family | Admitting: Family

## 2019-07-04 DIAGNOSIS — Z794 Long term (current) use of insulin: Secondary | ICD-10-CM | POA: Diagnosis not present

## 2019-07-04 DIAGNOSIS — Q858 Other phakomatoses, not elsewhere classified: Secondary | ICD-10-CM | POA: Insufficient documentation

## 2019-07-04 DIAGNOSIS — Z881 Allergy status to other antibiotic agents status: Secondary | ICD-10-CM | POA: Insufficient documentation

## 2019-07-04 DIAGNOSIS — N2889 Other specified disorders of kidney and ureter: Secondary | ICD-10-CM | POA: Diagnosis not present

## 2019-07-04 DIAGNOSIS — C642 Malignant neoplasm of left kidney, except renal pelvis: Secondary | ICD-10-CM | POA: Diagnosis not present

## 2019-07-04 DIAGNOSIS — Z8249 Family history of ischemic heart disease and other diseases of the circulatory system: Secondary | ICD-10-CM | POA: Diagnosis not present

## 2019-07-04 DIAGNOSIS — Z905 Acquired absence of kidney: Secondary | ICD-10-CM | POA: Insufficient documentation

## 2019-07-04 DIAGNOSIS — Z888 Allergy status to other drugs, medicaments and biological substances status: Secondary | ICD-10-CM | POA: Diagnosis not present

## 2019-07-04 DIAGNOSIS — I10 Essential (primary) hypertension: Secondary | ICD-10-CM | POA: Diagnosis not present

## 2019-07-04 DIAGNOSIS — C7B8 Other secondary neuroendocrine tumors: Secondary | ICD-10-CM | POA: Insufficient documentation

## 2019-07-04 DIAGNOSIS — Z79899 Other long term (current) drug therapy: Secondary | ICD-10-CM | POA: Insufficient documentation

## 2019-07-04 DIAGNOSIS — E891 Postprocedural hypoinsulinemia: Secondary | ICD-10-CM | POA: Diagnosis not present

## 2019-07-04 DIAGNOSIS — E119 Type 2 diabetes mellitus without complications: Secondary | ICD-10-CM | POA: Insufficient documentation

## 2019-07-04 DIAGNOSIS — Z87891 Personal history of nicotine dependence: Secondary | ICD-10-CM | POA: Insufficient documentation

## 2019-07-04 LAB — BASIC METABOLIC PANEL WITH GFR
Anion gap: 8 (ref 5–15)
BUN: 17 mg/dL (ref 6–20)
CO2: 24 mmol/L (ref 22–32)
Calcium: 8.2 mg/dL — ABNORMAL LOW (ref 8.9–10.3)
Chloride: 102 mmol/L (ref 98–111)
Creatinine, Ser: 0.57 mg/dL — ABNORMAL LOW (ref 0.61–1.24)
GFR calc Af Amer: >60 mL/min
GFR calc non Af Amer: >60 mL/min
Glucose, Bld: 195 mg/dL — ABNORMAL HIGH (ref 70–99)
Potassium: 4.1 mmol/L (ref 3.5–5.1)
Sodium: 134 mmol/L — ABNORMAL LOW (ref 135–145)

## 2019-07-04 LAB — TYPE AND SCREEN
ABO/RH(D): A POS
Antibody Screen: NEGATIVE

## 2019-07-04 LAB — CBC WITH DIFFERENTIAL/PLATELET
Abs Immature Granulocytes: 0.05 10*3/uL (ref 0.00–0.07)
Basophils Absolute: 0 10*3/uL (ref 0.0–0.1)
Basophils Relative: 0 %
Eosinophils Absolute: 0.1 10*3/uL (ref 0.0–0.5)
Eosinophils Relative: 1 %
HCT: 42.1 % (ref 39.0–52.0)
Hemoglobin: 13.1 g/dL (ref 13.0–17.0)
Immature Granulocytes: 1 %
Lymphocytes Relative: 7 %
Lymphs Abs: 0.7 10*3/uL (ref 0.7–4.0)
MCH: 27.8 pg (ref 26.0–34.0)
MCHC: 31.1 g/dL (ref 30.0–36.0)
MCV: 89.4 fL (ref 80.0–100.0)
Monocytes Absolute: 0.8 10*3/uL (ref 0.1–1.0)
Monocytes Relative: 8 %
Neutro Abs: 8.5 10*3/uL — ABNORMAL HIGH (ref 1.7–7.7)
Neutrophils Relative %: 83 %
Platelets: 206 10*3/uL (ref 150–400)
RBC: 4.71 MIL/uL (ref 4.22–5.81)
RDW: 13.6 % (ref 11.5–15.5)
WBC: 10.1 10*3/uL (ref 4.0–10.5)
nRBC: 0 % (ref 0.0–0.2)

## 2019-07-04 LAB — PROTIME-INR
INR: 1.2 (ref 0.8–1.2)
Prothrombin Time: 14.9 seconds (ref 11.4–15.2)

## 2019-07-04 LAB — GLUCOSE, CAPILLARY: Glucose-Capillary: 194 mg/dL — ABNORMAL HIGH (ref 70–99)

## 2019-07-04 MED ORDER — FENTANYL CITRATE (PF) 100 MCG/2ML IJ SOLN
INTRAMUSCULAR | Status: AC
  Filled 2019-07-04: qty 2

## 2019-07-04 MED ORDER — FENTANYL CITRATE (PF) 100 MCG/2ML IJ SOLN
INTRAMUSCULAR | Status: AC | PRN
  Administered 2019-07-04: 50 ug via INTRAVENOUS

## 2019-07-04 MED ORDER — MIDAZOLAM HCL 2 MG/2ML IJ SOLN
INTRAMUSCULAR | Status: AC
  Filled 2019-07-04: qty 2

## 2019-07-04 MED ORDER — GELATIN ABSORBABLE 12-7 MM EX MISC
CUTANEOUS | Status: AC
  Filled 2019-07-04: qty 1

## 2019-07-04 MED ORDER — SODIUM CHLORIDE 0.9 % IV SOLN: INTRAVENOUS | Status: DC

## 2019-07-04 MED ORDER — MIDAZOLAM HCL 2 MG/2ML IJ SOLN
INTRAMUSCULAR | Status: AC | PRN
  Administered 2019-07-04: 1 mg via INTRAVENOUS

## 2019-07-04 MED ORDER — LIDOCAINE HCL (PF) 1 % IJ SOLN
INTRAMUSCULAR | Status: AC
  Filled 2019-07-04: qty 30

## 2019-07-04 NOTE — Discharge Instructions (Signed)
Percutaneous Kidney Biopsy, Care After °This sheet gives you information about how to care for yourself after your procedure. Your health care provider may also give you more specific instructions. If you have problems or questions, contact your health care provider. °What can I expect after the procedure? °After the procedure, it is common to have: °· Pain or soreness near the area where the needle went through your skin (biopsy site). °· Bright pink or cloudy urine for 24 hours after the procedure. °Follow these instructions at home: °Activity °· Return to your normal activities as told by your health care provider. Ask your health care provider what activities are safe for you. °· Do not drive for 24 hours if you were given a medicine to help you relax (sedative). °· Do not lift anything that is heavier than 10 lb (4.5 kg) until your health care provider tells you that it is safe. °· Avoid activities that take a lot of effort (are strenuous) until your health care provider approves. Most people will have to wait 2 weeks before returning to activities such as exercise or sexual intercourse. °General instructions ° °· Take over-the-counter and prescription medicines only as told by your health care provider. °· You may eat and drink after your procedure. Follow instructions from your health care provider about eating or drinking restrictions. °· Check your biopsy site every day for signs of infection. Check for: °? More redness, swelling, or pain. °? More fluid or blood. °? Warmth. °? Pus or a bad smell. °· Keep all follow-up visits as told by your health care provider. This is important. °Contact a health care provider if: °· You have more redness, swelling, or pain around your biopsy site. °· You have more fluid or blood coming from your biopsy site. °· Your biopsy site feels warm to the touch. °· You have pus or a bad smell coming from your biopsy site. °· You have blood in your urine more than 24 hours after  your procedure. °Get help right away if: °· You have dark red or brown urine. °· You have a fever. °· You are unable to urinate. °· You feel burning when you urinate. °· You feel faint. °· You have severe pain in your abdomen or side. °This information is not intended to replace advice given to you by your health care provider. Make sure you discuss any questions you have with your health care provider. °Document Released: 07/25/2013 Document Revised: 11/04/2017 Document Reviewed: 09/03/2016 °Elsevier Patient Education © 2020 [employer] ° °

## 2019-07-04 NOTE — Procedures (Signed)
Interventional Radiology Procedure Note  Procedure: US guided left renal mass biopsy. .  Complications: None Recommendations:  - Ok to shower tomorrow - Do not submerge for 7 days - Routine wound care - 2 hr observation   Signed,  Dulcy Fanny. Earleen Newport, DO

## 2019-07-04 NOTE — H&P (Signed)
Chief Complaint: Patient was seen in consultation today for left renal mass biopsy.  Referring Physician(s): [place],[name]  Supervising Physician: Corrie Mckusick  Patient Status: [facility] - Out-pt  History of Present Illness: [name]. is a 58 y.o. 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 Marin Murphy who presents today for a left renal mass biopsy. Patient was last seen by Dr. Marin Murphy on 06/13/19 to discuss the results of his most recent CT scan which noted an increase in the size of the previously known left renal masses with the left lower mass measuring 5.2 x 6.9 cm and the upper left mass measuring 3.9 x 4 cm. Request has been made to IR for image guided left renal mass biopsy for further molecular studies.  Patient laying comfortably in bed in short stay, his only complaint is chronic soreness at his SP catheter site. He states that mouth is a little dry due to recently taking his gabapentin and zanaflex so he is looking forward to being able to drink something. He states his right upper extremity has become weak and he is mostly unable to move his fingers. He is also paralyzed from the waist down. He states that he knows he is here for a biopsy ordered by Dr. Marin Murphy. After thorough discussion of procedure indications and risks [name] would like to proceed with biopsy today.  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[place])    genetic disease that causes tumors    Past Surgical History:  Procedure Laterality Date   BACK SURGERY     '91- T#-T5 "tumor resection"-benign   CERVICAL SPINE SURGERY     '07- resection tumor C5-C7-"benign"   CERVICAL SPINE SURGERY     9'14 [facility] - meningioblastoma C1 resection   COLONOSCOPY WITH PROPOFOL N/A 08/20/2014   Procedure: COLONOSCOPY WITH PROPOFOL;  Surgeon: Cleotis Nipper, MD;  Location: [facility];  Service: Endoscopy;  Laterality: N/A;   CRANIOTOMY FOR TUMOR     medulla tumor "benign tumor"-salvage own bone for closure   CYSTOSCOPY WITH LITHOLAPAXY N/A 02/04/2014   Procedure: CYSTOSCOPY WITH LITHOLAPAXY;  Surgeon: Franchot Gallo, MD;  Location: [facility];  Service: Urology;  Laterality: N/A;   ENUCLEATION Left    '05 with prosthesis   FLEXIBLE SIGMOIDOSCOPY N/A 09/26/2015   Procedure: FLEXIBLE SIGMOIDOSCOPY;  Surgeon: Laurence Spates, MD;  Location: [facility];  Service: Endoscopy;  Laterality: N/A;   INSERTION OF SUPRAPUBIC CATHETER N/A 02/04/2014   Procedure: INSERTION OF SUPRAPUBIC CATHETER;  Surgeon: Franchot Gallo, MD;  Location: [facility];  Service: Urology;  Laterality: N/A;   IR GENERIC HISTORICAL  07/15/2016   IR ANGIOGRAM VISCERAL SELECTIVE 07/15/2016 Arne Cleveland, MD [facility]   IR GENERIC HISTORICAL  07/15/2016   IR  EMBO ARTERIAL NOT HEMORR HEMANG INC GUIDE ROADMAPPING 07/15/2016 Arne Cleveland, MD [facility]   IR GENERIC HISTORICAL  07/15/2016   IR ANGIOGRAM SELECTIVE EACH ADDITIONAL VESSEL 07/15/2016 Arne Cleveland, MD [facility]   IR GENERIC HISTORICAL  07/15/2016   IR ANGIOGRAM SELECTIVE EACH ADDITIONAL VESSEL 07/15/2016 Arne Cleveland, MD [facility]   IR GENERIC HISTORICAL  07/15/2016   IR ANGIOGRAM  SELECTIVE EACH ADDITIONAL VESSEL 07/15/2016 Arne Cleveland, MD [facility]   IR GENERIC HISTORICAL  07/15/2016   IR ANGIOGRAM SELECTIVE EACH ADDITIONAL VESSEL 07/15/2016 Arne Cleveland, MD [facility]   IR GENERIC HISTORICAL  07/15/2016   IR US GUIDE VASC ACCESS RIGHT 07/15/2016 Arne Cleveland, MD [facility]   IR GENERIC HISTORICAL  07/15/2016   IR ANGIOGRAM VISCERAL SELECTIVE 07/15/2016 Arne Cleveland, MD [facility]   IR GENERIC HISTORICAL  07/15/2016   IR ANGIOGRAM SELECTIVE EACH ADDITIONAL VESSEL 07/15/2016 Arne Cleveland, MD [facility]   IR GENERIC HISTORICAL  07/28/2016   IR ANGIOGRAM VISCERAL SELECTIVE 07/28/2016 Arne Cleveland, MD [facility]   IR GENERIC HISTORICAL  07/28/2016   IR US GUIDE VASC ACCESS RIGHT 07/28/2016 Arne Cleveland, MD [facility]   IR GENERIC HISTORICAL  07/28/2016   IR EMBO TUMOR ORGAN ISCHEMIA INFARCT INC GUIDE ROADMAPPING 07/28/2016 Arne Cleveland, MD [facility]   IR GENERIC HISTORICAL  06/30/2016   IR RADIOLOGIST EVAL & MGMT 06/30/2016 Greggory Keen, MD [facility]   MUSCLE FLAP MYOCUTANEOUS / FASCIOCUTANEOUS OF TRUNK Left    '01   PANCREAS SURGERY     '09- with partial liver resection/ pancreas "[place]"   PARTIAL NEPHRECTOMY Right    '09- cancer   PERIPHERALLY INSERTED CENTRAL CATHETER INSERTION     past hx.   RECONSTRUCTION BREAST W/ TRAM FLAP      Allergies: Ciprofloxacin, Citalopram, and Ciprocin-fluocin-procin [fluocinolone acetonide]  Medications: Prior to Admission medications   Medication Sig Start Date End Date Taking? Authorizing Provider  Ascorbic Acid (VITAMIN C) 1000 MG tablet Take 1,000 mg by mouth daily.   Yes [provider]  baclofen (LIORESAL) 20 MG tablet TAKE 1 TO 2 TABLETS BY MOUTH 4 TIMES A DAY Patient taking differently: Take 20-40 mg by mouth 4 (four) times daily.  04/12/19  Yes Ennever, Rudell Cobb, MD  CRANBERRY CONCENTRATE PO Take 1 capsule by mouth 3 (three) times daily.   Yes [provider]  CREON 24000-76000 units CPEP TAKE 3 CAPSULES BY MOUTH THREE TIMES DAILY WITH MEALS Patient taking differently: Take 3 capsules by mouth 3 (three) times daily with meals.  06/07/19  Yes Ennever, Rudell Cobb, MD  diazepam (VALIUM) 5 MG tablet TAKE 1 TABLET BY MOUTH 3 TIMES DAILY FOR SPASMS/ANXIETY Patient taking differently: Take 5 mg by mouth every 8 (eight) hours as needed for anxiety or muscle spasms.  10/11/17  Yes Ennever, Rudell Cobb, MD  diphenoxylate-atropine (LOMOTIL) 2.5-0.025 MG tablet TAKE 1 TABLET BY MOUTH 4 TIMES DAILY AS NEEDED FOR DIARRHEA OR LOOSE STOOLS Patient taking differently: Take 1 tablet by mouth 4 (four) times daily as needed for diarrhea or loose stools.  02/26/19  Yes Ennever, Rudell Cobb, MD  dronabinol (MARINOL) 5 MG capsule TAKE 1 CAPSULE BY MOUTH TWICE DAILY WITH MEALS Patient taking differently: Take 5 mg by mouth 2 (two) times daily before lunch and supper.  04/06/19  Yes Volanda Napoleon, MD  fentaNYL (DURAGESIC) 12 MCG/HR APPLY 1 PATCH ON THE SKIN EVERY 3 DAYS Patient taking differently: Place 1 patch onto the skin  every 3 (three) days.  06/22/19  Yes Ennever, Rudell Cobb, MD  gabapentin (NEURONTIN) 300 MG capsule TAKE 2 CAPSULES BY MOUTH 3 TIMES DAILY. Patient taking differently: Take 600 mg by mouth 3 (three) times daily.  12/26/18  Yes Ennever, Rudell Cobb, MD  Glucagon (BAQSIMI ONE PACK) 3 MG/DOSE POWD Place 1 spray into the nose daily as needed (low glucose).   Yes [provider]  HYDROcodone-acetaminophen (NORCO/VICODIN) 5-325 MG tablet Take 1-2 tablets by mouth every 6 (six) hours as needed for moderate pain. 08/04/18  Yes Volanda Napoleon, MD  insulin glargine (LANTUS) 100 UNIT/ML injection Inject 25 Units into the skin daily before breakfast.    Yes [provider]  insulin lispro (HUMALOG) 100 UNIT/ML injection Inject 3-9 Units into the skin 3 (three) times daily before meals. SS   Yes [provider]  midodrine (PROAMATINE) 5 MG tablet TAKE  0.5 TABLET BY MOUTH TWICE A DAY AS NEEDED Patient taking differently: Take 2.5 mg by mouth 2 (two) times daily as needed (blood pressure).  10/11/18  Yes Volanda Napoleon, MD  ondansetron (ZOFRAN) 8 MG tablet Take 1 tablet (8 mg total) by mouth 2 (two) times daily as needed for nausea or vomiting. 06/20/19  Yes Gus Height, MD  oxybutynin (DITROPAN) 5 MG tablet Take 5 mg by mouth 2 (two) times daily as needed for bladder spasms.  05/10/16  Yes [provider]  promethazine (PHENERGAN) 12.5 MG tablet Take 1 tablet (12.5 mg total) by mouth every 6 (six) hours as needed for nausea. 05/02/19  Yes Ennever, Rudell Cobb, MD  solifenacin (VESICARE) 5 MG tablet TAKE 1 TABLET BY MOUTH ONCE DAILY 02/21/19  Yes Ennever, Rudell Cobb, MD  tiZANidine (ZANAFLEX) 4 MG tablet Take 2 tablets (8 mg total) by mouth every 6 (six) hours as needed. For pain 06/29/19  Yes Ennever, Rudell Cobb, MD  traMADol (ULTRAM) 50 MG tablet TAKE 1 TABLET BY MOUTH 3 TIMES DAILY Patient taking differently: Take 50 mg by mouth 3 (three) times daily.  06/14/19  Yes [place], Holli Humbles, NP  ACCU-CHEK SMARTVIEW test strip  05/10/16   [provider]  HYDROmorphone (DILAUDID) 4 MG tablet Take 1 tablet (4 mg total) by mouth every 6 (six) hours as needed for severe pain. 07/02/19   Volanda Napoleon, MD     Family History  Problem Relation Age of Onset   Heart failure Father     Social History   Socioeconomic History   Marital status: Married    Spouse name: Not on file   Number of children: Not on file   Years of education: Not on file   Highest education level: Not on file  Occupational History   Not on file  Social Needs   Financial resource strain: Not on file   Food insecurity    Worry: Not on file    Inability: Not on file   Transportation needs    Medical: Not on file    Non-medical: Not on file  Tobacco Use   Smoking status: Former Smoker    Packs/day: 0.50    Years: 6.00    Pack years: 3.00    Types: Cigarettes     Start date: 01/15/1999    Quit date: 09/23/2008    Years since quitting: 10.7   Smokeless tobacco: Never Used   Tobacco comment: quit 2008  Substance and Sexual Activity   Alcohol use: Yes    Alcohol/week: 0.0 standard drinks  Comment: only rare occasions   Drug use: No   Sexual activity: Never  Lifestyle   Physical activity    Days per week: Not on file    Minutes per session: Not on file   Stress: Not on file  Relationships   Social connections    Talks on phone: Not on file    Gets together: Not on file    Attends religious service: Not on file    Active member of club or organization: Not on file    Attends meetings of clubs or organizations: Not on file    Relationship status: Not on file  Other Topics Concern   Not on file  Social History Narrative   Not on file     Review of Systems: A 12 point ROS discussed and pertinent positives are indicated in the HPI above.  All other systems are negative.  Review of Systems  Constitutional: Negative for chills and fever.  Respiratory: Negative for cough and shortness of breath.   Cardiovascular: Negative for chest pain.  Gastrointestinal: Positive for abdominal pain (soreness at [facility] catheter site; chronic, unchanged). Negative for diarrhea, nausea and vomiting.  Genitourinary: Negative for hematuria.  Musculoskeletal: Negative for back pain.  Neurological: Positive for weakness (right upper extremity, paralyzed from waist down). Negative for dizziness.    Vital Signs: BP (!) 133/91 (BP Location: Left Arm)    Pulse (!) 55    Temp (!) 97.3 F (36.3 C)    Resp 13    Ht 6\' 2"  (1.88 Murphy)    Wt 163 lb (73.9 kg)    SpO2 100%    BMI 20.93 kg/Murphy   Physical Exam Vitals signs reviewed.  Constitutional:      General: He is not in acute distress. HENT:     Head: Normocephalic.  Cardiovascular:     Rate and Rhythm: Regular rhythm. Bradycardia present.  Pulmonary:     Effort: Pulmonary effort is normal.     Breath  sounds: Normal breath sounds.  Abdominal:     General: There is no distension.     Palpations: Abdomen is soft.     Comments: (+) SP catheter; insertion site unremarkable  Skin:    General: Skin is warm and dry.  Neurological:     Mental Status: He is alert and oriented to person, place, and time.  Psychiatric:        Mood and Affect: Mood normal.        Behavior: Behavior normal.        Thought Content: Thought content normal.        Judgment: Judgment normal.     MD Evaluation Airway: WNL Heart: WNL Abdomen: WNL Chest/ Lungs: WNL ASA  Classification: 3 Mallampati/Airway Score: Two   Imaging: Nm Lutathera Administration  Result Date: 06/20/2019 CLINICAL DATA:  58 year-old male with metastatic neuroendocrine tumor. Well differentiated tumor with somatostatin receptor is identified within the liver by DOTATATE PET CT scan. History of von Hippel-Lindau syndrome with renal cell carcinoma and hemangioma blastoma is as well as metastatic well-differentiated neuroendocrine tumor. Of note patient initiated peptide receptor radiotherapy in July 2019 but course was terminated due to patient traveling to [facility] for evaluation of intracranial hemangioblastoma. In the interval COVID-19 pandemic and delay of re-initiation. EXAM: NUCLEAR MEDICINE LUTATHERA ADMINISTRATION TECHNIQUE: Infusion: The nuclear medicine technologist and I personally verified the dose activity (204 mCi) to be delivered as specified in the written directive (200 mCi), and verified the patient identification via  2 separate methods. 20 gauge IV were started in the antecubital veins. Anti-emetics were administered by nursing staff. Amino acid renal protection was initiated 30 minutes prior to Lu 177 DOTATATE (Lutathera) infusion and continued continuously for 4 hours. Lutathera infusion was administered over 30 minutes. The total administered dose was 201.9 mCi Lu 177 DOTATATE. The entire IV tubing, venocatheter, stopcock and  syringes was removed in total, placed in a disposal bag and sent for assay of the residual activity, which will be reported at a later time in our EMR by the physics staff. Pressure was applied to the venipuncture sites, and a compression bandage placed. Radiation Safety personnel were present to perform the discharge survey, as detailed on their documentation. Patient received 30 mg IM long-acting Sandostatin injection 4 hours after Lutathera effusion in the nuclear medicine department. RADIOPHARMACEUTICALS:  201.9 mCi Lu 177 DOTATATE FINDINGS: Diagnosis: Metastatic neuroendocrine tumor. Current Infusion: 1 Planned Infusions: 4 Patient reports mild interval symptoms following first therapy. Some liver discomfort. No nausea the patient's most recent blood counts were reviewed and remains a good candidate to proceed with Lutathera. The patient was situated in an infusion suite and administered Lutathera as above. Patient will follow-up with referring oncologist for interval serum laboratories (CBC and CMP) in approximately 4 weeks. Patient received 30 mg IM long-acting Sandostatin injection 4 hours after Lutathera effusion in the nuclear medicine department. IMPRESSION: First BC 488 [id number] treatment for metastatic neuroendocrine tumor. The patient tolerated the infusion well. The patient will return in 8 weeks for ongoing care. Electronically Signed   By: Suzy Bouchard Murphy.D.   On: 06/20/2019 11:52    Labs:  CBC: Recent Labs    05/02/19 1508 06/13/19 1050 06/20/19 0840 07/04/19 1219  WBC 11.7* 8.3 6.3 10.1  HGB 14.2 13.6 13.5 13.1  HCT 44.9 43.6 44.2 42.1  PLT 214 231 205 206    COAGS: Recent Labs    07/04/19 1219  INR 1.2    BMP: Recent Labs    05/02/19 1508 06/13/19 1050 06/20/19 0840 07/04/19 1219  NA 132* 135 139 134*  K 4.4 4.1 4.2 4.1  CL 96* 100 101 102  CO2 28 29 26 24   GLUCOSE 276* 143* 155* 195*  BUN 21* 17 18 17   CALCIUM 8.3* 8.0* 8.8* 8.2*  CREATININE 0.68 0.57*  0.62 0.57*  GFRNONAA >60 >60 >60 >60  GFRAA >60 >60 >60 >60    LIVER FUNCTION TESTS: Recent Labs    04/04/19 1402 05/02/19 1508 06/13/19 1050 06/20/19 0840  BILITOT 0.8 0.7 0.5 0.5  AST 29 29 27 26   ALT 20 20 16 18   ALKPHOS 119 117 123 131*  PROT 6.8 6.9 6.6 6.9  ALBUMIN 3.4* 3.5 3.4* 3.2*    TUMOR MARKERS: Recent Labs    10/11/18 1119 11/15/18 1527 12/20/18 1412 01/17/19 1430  CHROMGRNA 4 5 59.5 5    Assessment and Plan:  58 y/o with history of Von Hippel-Lindau syndrome, metastatic neuroendocrine carcinoma followed by Dr. Marin Murphy who presents today for a biopsy of enlarging left renal mass for molecular studies.  Patient has been NPO since 5 am today, he does not take blood thinning medications. Afebrile, WBC 10.1, hgb 13.1, plt 206, creatinine 0.57, INR 1.2.  Risks and benefits of left renal mass biopsy was discussed with the patient and/or patient's family including, but not limited to bleeding, infection, damage to adjacent structures or low yield requiring additional tests.  All of the questions were answered and there is agreement  to proceed.  Consent signed and in chart.  Thank you for this interesting consult.  I greatly enjoyed meeting [name]. and look forward to participating in their care.  A copy of this report was sent to the requesting provider on this date.  Electronically Signed: Joaquim Nam, PA-C 07/04/2019, 12:56 PM   I spent a total of 30 Minutes in face to face in clinical consultation, greater than 50% of which was counseling/coordinating care for left renal mass biopsy.

## 2019-07-06 ENCOUNTER — Encounter: Payer: Self-pay | Admitting: *Deleted

## 2019-07-06 NOTE — Progress Notes (Signed)
[employer] paper work faxed per order of Dr. Marin Olp.

## 2019-07-11 MED FILL — CREON DR 24,000 UNITS CAP: 24000-76000 | 30 days supply | Qty: 270 | Fill #1

## 2019-07-12 ENCOUNTER — Inpatient Hospital Stay: Payer: 59 | Attending: Hematology & Oncology

## 2019-07-12 ENCOUNTER — Inpatient Hospital Stay: Payer: 59

## 2019-07-12 ENCOUNTER — Other Ambulatory Visit: Payer: Self-pay

## 2019-07-12 ENCOUNTER — Inpatient Hospital Stay (HOSPITAL_BASED_OUTPATIENT_CLINIC_OR_DEPARTMENT_OTHER): Payer: 59 | Admitting: Hematology & Oncology

## 2019-07-12 ENCOUNTER — Encounter: Payer: Self-pay | Admitting: Hematology & Oncology

## 2019-07-12 VITALS — BP 109/74 | HR 60 | Temp 97.1°F | Resp 20 | Wt 160.0 lb

## 2019-07-12 DIAGNOSIS — Q858 Other phakomatoses, not elsewhere classified: Secondary | ICD-10-CM | POA: Diagnosis not present

## 2019-07-12 DIAGNOSIS — K922 Gastrointestinal hemorrhage, unspecified: Secondary | ICD-10-CM | POA: Diagnosis not present

## 2019-07-12 DIAGNOSIS — C642 Malignant neoplasm of left kidney, except renal pelvis: Secondary | ICD-10-CM | POA: Diagnosis not present

## 2019-07-12 DIAGNOSIS — Z79899 Other long term (current) drug therapy: Secondary | ICD-10-CM | POA: Diagnosis not present

## 2019-07-12 DIAGNOSIS — C649 Malignant neoplasm of unspecified kidney, except renal pelvis: Secondary | ICD-10-CM | POA: Diagnosis not present

## 2019-07-12 DIAGNOSIS — D500 Iron deficiency anemia secondary to blood loss (chronic): Secondary | ICD-10-CM | POA: Diagnosis not present

## 2019-07-12 DIAGNOSIS — C7A8 Other malignant neuroendocrine tumors: Secondary | ICD-10-CM | POA: Diagnosis not present

## 2019-07-12 DIAGNOSIS — Q8583 Von Hippel-Lindau syndrome: Secondary | ICD-10-CM

## 2019-07-12 LAB — CBC WITH DIFFERENTIAL (CANCER CENTER ONLY)
Abs Immature Granulocytes: 0.06 10*3/uL (ref 0.00–0.07)
Basophils Absolute: 0 10*3/uL (ref 0.0–0.1)
Basophils Relative: 0 %
Eosinophils Absolute: 0.2 10*3/uL (ref 0.0–0.5)
Eosinophils Relative: 2 %
HCT: 38.8 % — ABNORMAL LOW (ref 39.0–52.0)
Hemoglobin: 12.4 g/dL — ABNORMAL LOW (ref 13.0–17.0)
Immature Granulocytes: 1 %
Lymphocytes Relative: 6 %
Lymphs Abs: 0.6 10*3/uL — ABNORMAL LOW (ref 0.7–4.0)
MCH: 28.1 pg (ref 26.0–34.0)
MCHC: 32 g/dL (ref 30.0–36.0)
MCV: 88 fL (ref 80.0–100.0)
Monocytes Absolute: 0.9 10*3/uL (ref 0.1–1.0)
Monocytes Relative: 8 %
Neutro Abs: 9.2 10*3/uL — ABNORMAL HIGH (ref 1.7–7.7)
Neutrophils Relative %: 83 %
Platelet Count: 137 10*3/uL — ABNORMAL LOW (ref 150–400)
RBC: 4.41 MIL/uL (ref 4.22–5.81)
RDW: 14.4 % (ref 11.5–15.5)
WBC Count: 11 10*3/uL — ABNORMAL HIGH (ref 4.0–10.5)
nRBC: 0 % (ref 0.0–0.2)

## 2019-07-12 LAB — CMP (CANCER CENTER ONLY)
ALT: 9 U/L (ref 0–44)
AST: 15 U/L (ref 15–41)
Albumin: 2.8 g/dL — ABNORMAL LOW (ref 3.5–5.0)
Alkaline Phosphatase: 94 U/L (ref 38–126)
Anion gap: 6 (ref 5–15)
BUN: 26 mg/dL — ABNORMAL HIGH (ref 6–20)
CO2: 28 mmol/L (ref 22–32)
Calcium: 7.9 mg/dL — ABNORMAL LOW (ref 8.9–10.3)
Chloride: 96 mmol/L — ABNORMAL LOW (ref 98–111)
Creatinine: 0.91 mg/dL (ref 0.61–1.24)
GFR, Est AFR Am: >60 mL/min (ref >60)
GFR, Estimated: >60 mL/min (ref >60)
Glucose, Bld: 109 mg/dL — ABNORMAL HIGH (ref 70–99)
Potassium: 4 mmol/L (ref 3.5–5.1)
Sodium: 130 mmol/L — ABNORMAL LOW (ref 135–145)
Total Bilirubin: 0.6 mg/dL (ref 0.3–1.2)
Total Protein: 5.7 g/dL — ABNORMAL LOW (ref 6.5–8.1)

## 2019-07-12 MED ORDER — LANREOTIDE ACETATE 120 MG/0.5ML ~~LOC~~ SOLN
120.0000 mg | Freq: Once | SUBCUTANEOUS | Status: AC
  Administered 2019-07-12: 120 mg via SUBCUTANEOUS

## 2019-07-12 MED ORDER — LANREOTIDE ACETATE 120 MG/0.5ML ~~LOC~~ SOLN
SUBCUTANEOUS | Status: AC
  Filled 2019-07-12: qty 120

## 2019-07-12 MED FILL — DRONABINOL 5 MG CAP: 5 | 30 days supply | Qty: 60 | Fill #3

## 2019-07-12 NOTE — Progress Notes (Signed)
Hematology and Oncology Follow Up Visit  [name] [id number] January 13, 1961 58 y.o. 01/09/2018   Principle Diagnosis:   Metastatic neuroendocrine carcinoma  Von Hipple-Lindau Syndrome  Intermittent iron deficiency anemia secondary to GI bleeding.  Current Therapy:  Temodar/Xeloda - start 03/17/2016 - s/p c#3 - discontinued Somatuline 120mg  SQ q month Lutathera -- cycle #1/4 on 06/20/2019 IV iron as indicated-Feraheme given on 11/19/2018     Interim History:  [name] is back for followup.  He is doing okay.  He did have his Lutathera treatment couple weeks ago.  The first 4 treatments.  We did go ahead and do a kidney biopsy on his left kidney.  This was done on 07/04/2019.  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  I am sending off the tumor for molecular analysis.  I just do not think to be a surgical candidate for his kidney cancer.  It is hard to say if the kidney cancer has spread.  I think it would be difficult for [place] to really do any test to know this.  I think that immunotherapy is going to be the best way for [place] to try to help this tumor.  I want to try to maintain his renal functions.  I would hate to see him have to go on to dialysis.  He has had no problems with cough.  He has had no headache.  He has had no issues with the suprapubic catheter for his bladder.  He does look a little bit distended today.  Overall, I was his performance status is ECOG 1.    Medications:  Current Outpatient Medications:  .  ACCU-CHEK SMARTVIEW test strip, , Disp: , Rfl: 10 .  baclofen (LIORESAL) 10 mg/mL SUSP, , Disp: , Rfl:  .  baclofen (LIORESAL) 20 MG tablet, , Disp: , Rfl:  .  Balsam Peru-Castor Oil (VENELEX) OINT, Apply topically as needed, Disp: 60 g, Rfl: 4 .  capecitabine (XELODA) 500 MG tablet, Take by mouth., Disp: , Rfl:  .  chlorproMAZINE (THORAZINE) 10 MG tablet, Take by mouth., Disp: , Rfl:  .  diazepam (VALIUM) 5 MG  tablet, TAKE 1 TABLET BY MOUTH 3 TIMES DAILY FOR SPASMS/ANXIETY, Disp: 90 tablet, Rfl: 1 .  diphenoxylate-atropine (LOMOTIL) 2.5-0.025 MG tablet, TAKE 1 TABLET BY MOUTH 4 TIMES DAILY AS NEEDED FOR DIARRHEA OR LOOSE STOOLS, Disp: 100 tablet, Rfl: 0 .  doxazosin (CARDURA) 1 MG tablet, Take 1 tablet (1 mg total) by mouth 2 (two) times daily. (Patient taking differently: Take 1 mg by mouth 2 (two) times daily as needed (blood pressure spikes.). ), Disp: 60 tablet, Rfl: 2 .  dronabinol (MARINOL) 5 MG capsule, TAKE 1 CAPSULE BY MOUTH TWICE DAILY WITH MEALS, Disp: 60 capsule, Rfl: 3 .  gabapentin (NEURONTIN) 300 MG capsule, TAKE 2 CAPSULES BY MOUTH 3 TIMES DAILY., Disp: 180 capsule, Rfl: 6 .  HYDROcodone-acetaminophen (NORCO/VICODIN) 5-325 MG tablet, Take 1-2 tablets by mouth every 6 (six) hours as needed for moderate pain., Disp: 90 tablet, Rfl: 0 .  HYDROmorphone (DILAUDID) 4 MG tablet, Take 1 tablet (4 mg total) by mouth every 6 (six) hours as needed for severe pain., Disp: 40 tablet, Rfl: 0 .  insulin glargine (LANTUS) 100 UNIT/ML injection, Inject 31 Units into the skin daily before breakfast. , Disp: , Rfl:  .  insulin lispro (HUMALOG) 100 UNIT/ML injection, Inject 3-9 Units into the skin 3 (three) times daily before meals. SS, Disp: ,  Rfl:  .  lipase/protease/amylase (CREON) 12000 units CPEP capsule, Take by mouth., Disp: , Rfl:  .  midodrine (PROAMATINE) 5 MG tablet, TKAE 1/2 TABLET BY MOUTH TWICE A DAY AS NEEDED, Disp: 60 tablet, Rfl: 6 .  NON FORMULARY, Take by mouth., Disp: , Rfl:  .  oxybutynin (DITROPAN) 5 MG tablet, 5 mg 2 (two) times daily. , Disp: , Rfl: 3 .  promethazine (PHENERGAN) 12.5 MG tablet, Take 12.5 mg by mouth every 6 (six) hours as needed for nausea. , Disp: , Rfl:  .  tiZANidine (ZANAFLEX) 4 MG tablet, TAKE 2 TABLETS BY MOUTH EVERY 6 HOURS AS NEEDED FOR PAIN, Disp: 30 tablet, Rfl: 3 .  traMADol (ULTRAM) 50 MG tablet, TAKE 1 TABLET BY MOUTH 3 TIMES DAILY, Disp: 90 tablet, Rfl: 2   Allergies:  Allergies  Allergen Reactions  . Citalopram Diarrhea  . Ciprocin-Fluocin-Procin [Fluocinolone Acetonide] Rash    Pt states this happened with IV Cipro. ABLE TO TAKE PO CIPRO.  . Other Rash    IV-CIPRO    Past Medical History, Surgical history, Social history, and Family History were reviewed and updated.  Review of Systems: Review of Systems  Constitutional: Negative.   HENT: Negative.   Eyes: Negative.   Respiratory: Negative.   Cardiovascular: Negative.   Gastrointestinal: Positive for diarrhea and nausea.  Genitourinary: Positive for frequency.  Musculoskeletal: Positive for myalgias and neck pain.  Skin: Negative.   Neurological: Negative.   Endo/Heme/Allergies: Negative.   Psychiatric/Behavioral: Negative.      Physical Exam:  oral temperature is 97.5 F (36.4 C) (abnormal). His blood pressure is 82/54 (abnormal) and his pulse is 94. His respiration is 18 and oxygen saturation is 99%.   Physical Exam Vitals signs reviewed.  HENT:     Head: Normocephalic and atraumatic.  Eyes:     Pupils: Pupils are equal, round, and reactive to light.  Neck:     Musculoskeletal: Normal range of motion.  Cardiovascular:     Rate and Rhythm: Normal rate and regular rhythm.     Heart sounds: Normal heart sounds.  Pulmonary:     Effort: Pulmonary effort is normal.     Breath sounds: Normal breath sounds.  Abdominal:     General: Bowel sounds are normal.     Palpations: Abdomen is soft.     Comments: He has a suprapubic catheter in place  Musculoskeletal: Normal range of motion.        General: No tenderness or deformity.     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  Skin:    General: Skin is warm and dry.     Findings: No erythema or rash.  Neurological:     Mental Status: He is alert and oriented to person, place, and time.  Psychiatric:        Behavior: Behavior normal.        Thought Content:  Thought content normal.        Judgment: Judgment normal.   .  Lab Results  Component Value Date   WBC 11.3 (H) 01/09/2018   HGB 13.8 12/05/2017   HCT 43.8 01/09/2018   MCV 86.4 01/09/2018   PLT 288 01/09/2018     Chemistry      Component Value Date/Time   NA 137 01/09/2018 1118   NA 137 12/05/2017 0840   NA 138 10/12/2016 0955   K 3.3 01/09/2018 1118   K 3.4  12/05/2017 0840   K 3.3 (L) 10/12/2016 0955   CL 104 01/09/2018 1118   CL 101 12/05/2017 0840   CO2 30 01/09/2018 1118   CO2 29 12/05/2017 0840   CO2 25 10/12/2016 0955   BUN 14 01/09/2018 1118   BUN 16 12/05/2017 0840   BUN 22.0 10/12/2016 0955   CREATININE 0.8 12/05/2017 0840   CREATININE 0.8 10/12/2016 0955      Component Value Date/Time   CALCIUM 8.8 01/09/2018 1118   CALCIUM 8.6 12/05/2017 0840   CALCIUM 9.0 10/12/2016 0955   ALKPHOS 101 (H) 01/09/2018 1118   ALKPHOS 139 (H) 12/05/2017 0840   ALKPHOS 159 (H) 10/12/2016 0955   AST 20 01/09/2018 1118   AST 45 (H) 10/12/2016 0955   ALT 12 01/09/2018 1118   ALT 19 12/05/2017 0840   ALT 29 10/12/2016 0955   BILITOT 0.8 01/09/2018 1118   BILITOT 0.62 10/12/2016 0955     Impression and Plan: [name] is 58 year old gentleman with von Hippel-Lindau syndrome. He has multiple neuroendocrine tumors. 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 He's had multiple spinal surgeries.  We do have to worry about the kidney cancer.  I just hate to see this become an issue for him.  Again, I would have hoped that he would have had surgery [facility] for this.  However, they just not feel confident doing surgery.  We will have to see if immunotherapy would be the way to go.  We might combine immunotherapy with 1 of the oral agents.  We have had good success with this.  He did get his Somatuline today.  His next Lutathera is set up for September 15.  I would like to see him back in 2 weeks.  By then, should have the results back from his  molecular studies so we can try to just decide how to treat the kidney cancer.  I spent about 35 minutes with him today.  Given that he is to malignancies, this is somewhat complicated.  Volanda Napoleon, MD 2/4/20196:20 PM

## 2019-07-12 NOTE — Patient Instructions (Signed)
Lanreotide injection What is this medicine? LANREOTIDE (lan REE oh tide) is used to reduce blood levels of growth hormone in patients with a condition called acromegaly. It also works to slow or stop tumor growth in patients with neuroendocrine tumors and treat carcinoid syndrome. This medicine may be used for other purposes; ask your health care provider or pharmacist if you have questions. COMMON BRAND NAME(S): Somatuline [place] What should I tell my health care provider before I take this medicine? They need to know if you have any of these conditions:  diabetes  gallbladder disease  heart disease  kidney disease  liver disease  thyroid disease  an unusual or allergic reaction to lanreotide, other medicines, foods, dyes, or preservatives  pregnant or trying to get pregnant  breast-feeding How should I use this medicine? This medicine is for injection under the skin. It is given by a health care professional in a hospital or clinic setting. Contact your pediatrician or health care professional regarding the use of this medicine in children. Special care may be needed. Overdosage: If you think you have taken too much of this medicine contact a poison control center or emergency room at once. NOTE: This medicine is only for you. Do not share this medicine with others. What if I miss a dose? It is important not to miss your dose. Call your doctor or health care professional if you are unable to keep an appointment. What may interact with this medicine? This medicine may interact with the following medications:  bromocriptine  cyclosporine  certain medicines for blood pressure, heart disease, irregular heart beat  certain medicines for diabetes  quinidine  terfenadine This list may not describe all possible interactions. Give your health care provider a list of all the medicines, herbs, non-prescription drugs, or dietary supplements you use. Also tell them if you smoke,  drink alcohol, or use illegal drugs. Some items may interact with your medicine. What should I watch for while using this medicine? Tell your doctor or healthcare professional if your symptoms do not start to get better or if they get worse. Visit your doctor or health care professional for regular checks on your progress. Your condition will be monitored carefully while you are receiving this medicine. This medicine may increase blood sugar. Ask your healthcare provider if changes in diet or medicines are needed if you have diabetes. You may need blood work done while you are taking this medicine. Women should inform their doctor if they wish to become pregnant or think they might be pregnant. There is a potential for serious side effects to an unborn child. Talk to your health care professional or pharmacist for more information. Do not breast-feed an infant while taking this medicine or for 6 months after stopping it. This medicine has caused ovarian failure in some women. This medicine may interfere with the ability to have a child. Talk with your doctor or health care professional if you are concerned about your fertility. What side effects may I notice from receiving this medicine? Side effects that you should report to your doctor or health care professional as soon as possible:  allergic reactions like skin rash, itching or hives, swelling of the face, lips, or tongue  increased blood pressure  severe stomach pain  signs and symptoms of hgh blood sugar such as being more thirsty or hungry or having to urinate more than normal. You may also feel very tired or have blurry vision.  signs and symptoms of low blood   sugar such as feeling anxious; confusion; dizziness; increased hunger; unusually weak or tired; sweating; shakiness; cold; irritable; headache; blurred vision; fast heartbeat; loss of consciousness  unusually slow heartbeat Side effects that usually do not require medical  attention (report to your doctor or health care professional if they continue or are bothersome):  constipation  diarrhea  dizziness  headache  muscle pain  muscle spasms  nausea  pain, redness, or irritation at site where injected This list may not describe all possible side effects. Call your doctor for medical advice about side effects. You may report side effects to FDA at 1-800-FDA-1088. Where should I keep my medicine? This drug is given in a hospital or clinic and will not be stored at home. NOTE: This sheet is a summary. It may not cover all possible information. If you have questions about this medicine, talk to your doctor, pharmacist, or health care provider.  2020 Elsevier/Gold Standard (2018-08-31 09:13:08)  

## 2019-07-13 ENCOUNTER — Telehealth: Payer: Self-pay | Admitting: Hematology & Oncology

## 2019-07-13 LAB — CHROMOGRANIN A: Chromogranin A (ng/mL): 106.1 ng/mL — ABNORMAL HIGH (ref 0.0–101.8)

## 2019-07-13 LAB — TSH: TSH: 1.819 u[IU]/mL (ref 0.320–4.118)

## 2019-07-13 LAB — IRON AND TIBC
Iron: 12 ug/dL — ABNORMAL LOW (ref 42–163)
Saturation Ratios: 6 % — ABNORMAL LOW (ref 20–55)
TIBC: 201 ug/dL — ABNORMAL LOW (ref 202–409)
UIBC: 189 ug/dL (ref 117–376)

## 2019-07-13 LAB — FERRITIN: Ferritin: 375 ng/mL — ABNORMAL HIGH (ref 24–336)

## 2019-07-13 NOTE — Telephone Encounter (Signed)
Spoke with patient wife to confirm 8/21 appt at 130 pm per 8/6 LOS

## 2019-07-14 ENCOUNTER — Encounter: Payer: Self-pay | Admitting: Hematology & Oncology

## 2019-07-16 DIAGNOSIS — C642 Malignant neoplasm of left kidney, except renal pelvis: Secondary | ICD-10-CM | POA: Diagnosis not present

## 2019-07-17 ENCOUNTER — Other Ambulatory Visit: Payer: Self-pay

## 2019-07-17 ENCOUNTER — Inpatient Hospital Stay: Payer: 59

## 2019-07-17 VITALS — BP 168/99 | HR 55 | Temp 98.1°F | Resp 19

## 2019-07-17 DIAGNOSIS — C7A8 Other malignant neuroendocrine tumors: Secondary | ICD-10-CM | POA: Diagnosis not present

## 2019-07-17 DIAGNOSIS — Z79899 Other long term (current) drug therapy: Secondary | ICD-10-CM | POA: Diagnosis not present

## 2019-07-17 DIAGNOSIS — K922 Gastrointestinal hemorrhage, unspecified: Secondary | ICD-10-CM | POA: Diagnosis not present

## 2019-07-17 DIAGNOSIS — C649 Malignant neoplasm of unspecified kidney, except renal pelvis: Secondary | ICD-10-CM | POA: Diagnosis not present

## 2019-07-17 DIAGNOSIS — D500 Iron deficiency anemia secondary to blood loss (chronic): Secondary | ICD-10-CM | POA: Diagnosis not present

## 2019-07-17 DIAGNOSIS — Q858 Other phakomatoses, not elsewhere classified: Secondary | ICD-10-CM | POA: Diagnosis not present

## 2019-07-17 MED ORDER — SODIUM CHLORIDE 0.9 % IV SOLN
510.0000 mg | Freq: Once | INTRAVENOUS | Status: AC
  Administered 2019-07-17: 16:00:00 510 mg via INTRAVENOUS
  Filled 2019-07-17: qty 510

## 2019-07-17 MED ORDER — SODIUM CHLORIDE 0.9 % IV SOLN
INTRAVENOUS | Status: DC
  Administered 2019-07-17: 15:00:00 via INTRAVENOUS
  Filled 2019-07-17: qty 250

## 2019-07-17 NOTE — Patient Instructions (Signed)

## 2019-07-19 ENCOUNTER — Encounter (HOSPITAL_COMMUNITY): Payer: Self-pay | Admitting: Hematology & Oncology

## 2019-07-24 MED FILL — traMADol HCL 50 MG TABS: 50 | 30 days supply | Qty: 90 | Fill #1

## 2019-07-25 MED FILL — SOLIFENACIN SUCCINATE 5 MG: 5 | 30 days supply | Qty: 30 | Fill #4

## 2019-07-27 ENCOUNTER — Encounter: Payer: Self-pay | Admitting: Hematology & Oncology

## 2019-07-27 ENCOUNTER — Inpatient Hospital Stay (HOSPITAL_BASED_OUTPATIENT_CLINIC_OR_DEPARTMENT_OTHER): Payer: 59 | Admitting: Hematology & Oncology

## 2019-07-27 ENCOUNTER — Other Ambulatory Visit: Payer: Self-pay

## 2019-07-27 ENCOUNTER — Inpatient Hospital Stay: Payer: 59

## 2019-07-27 VITALS — BP 104/78 | HR 82 | Temp 97.2°F | Resp 18 | Ht 74.0 in | Wt 161.0 lb

## 2019-07-27 DIAGNOSIS — D500 Iron deficiency anemia secondary to blood loss (chronic): Secondary | ICD-10-CM | POA: Diagnosis not present

## 2019-07-27 DIAGNOSIS — C7A8 Other malignant neuroendocrine tumors: Secondary | ICD-10-CM

## 2019-07-27 DIAGNOSIS — K922 Gastrointestinal hemorrhage, unspecified: Secondary | ICD-10-CM | POA: Diagnosis not present

## 2019-07-27 DIAGNOSIS — C642 Malignant neoplasm of left kidney, except renal pelvis: Secondary | ICD-10-CM

## 2019-07-27 DIAGNOSIS — Q858 Other phakomatoses, not elsewhere classified: Secondary | ICD-10-CM | POA: Diagnosis not present

## 2019-07-27 DIAGNOSIS — Z79899 Other long term (current) drug therapy: Secondary | ICD-10-CM | POA: Diagnosis not present

## 2019-07-27 DIAGNOSIS — C649 Malignant neoplasm of unspecified kidney, except renal pelvis: Secondary | ICD-10-CM | POA: Diagnosis not present

## 2019-07-27 LAB — CMP (CANCER CENTER ONLY)
ALT: 18 U/L (ref 0–44)
AST: 26 U/L (ref 15–41)
Albumin: 3.2 g/dL — ABNORMAL LOW (ref 3.5–5.0)
Alkaline Phosphatase: 120 U/L (ref 38–126)
Anion gap: 8 (ref 5–15)
BUN: 17 mg/dL (ref 6–20)
CO2: 28 mmol/L (ref 22–32)
Calcium: 8.1 mg/dL — ABNORMAL LOW (ref 8.9–10.3)
Chloride: 98 mmol/L (ref 98–111)
Creatinine: 0.69 mg/dL (ref 0.61–1.24)
GFR, Est AFR Am: >60 mL/min (ref >60)
GFR, Estimated: >60 mL/min (ref >60)
Glucose, Bld: 228 mg/dL — ABNORMAL HIGH (ref 70–99)
Potassium: 3.9 mmol/L (ref 3.5–5.1)
Sodium: 134 mmol/L — ABNORMAL LOW (ref 135–145)
Total Bilirubin: 0.5 mg/dL (ref 0.3–1.2)
Total Protein: 7.3 g/dL (ref 6.5–8.1)

## 2019-07-27 LAB — CBC WITH DIFFERENTIAL (CANCER CENTER ONLY)
Abs Immature Granulocytes: 0.02 10*3/uL (ref 0.00–0.07)
Basophils Absolute: 0 10*3/uL (ref 0.0–0.1)
Basophils Relative: 1 %
Eosinophils Absolute: 0.1 10*3/uL (ref 0.0–0.5)
Eosinophils Relative: 3 %
HCT: 41.2 % (ref 39.0–52.0)
Hemoglobin: 12.8 g/dL — ABNORMAL LOW (ref 13.0–17.0)
Immature Granulocytes: 0 %
Lymphocytes Relative: 21 %
Lymphs Abs: 1.1 10*3/uL (ref 0.7–4.0)
MCH: 28 pg (ref 26.0–34.0)
MCHC: 31.1 g/dL (ref 30.0–36.0)
MCV: 90.2 fL (ref 80.0–100.0)
Monocytes Absolute: 0.4 10*3/uL (ref 0.1–1.0)
Monocytes Relative: 8 %
Neutro Abs: 3.4 10*3/uL (ref 1.7–7.7)
Neutrophils Relative %: 67 %
Platelet Count: 247 10*3/uL (ref 150–400)
RBC: 4.57 MIL/uL (ref 4.22–5.81)
RDW: 15 % (ref 11.5–15.5)
WBC Count: 5 10*3/uL (ref 4.0–10.5)
nRBC: 0 % (ref 0.0–0.2)

## 2019-07-27 MED ORDER — AXITINIB 5 MG PO TABS: 5.0000 mg | ORAL_TABLET | Freq: Two times a day (BID) | ORAL | 3 refills | Status: DC

## 2019-07-27 MED ORDER — OXYBUTYNIN CHLORIDE 5 MG PO TABS: 5.0000 mg | ORAL_TABLET | Freq: Two times a day (BID) | ORAL | 3 refills | Status: DC | PRN

## 2019-07-27 MED FILL — OXYBUTYNIN CHLORIDE 5 MG TA: 5 | 30 days supply | Qty: 60 | Fill #0

## 2019-07-27 NOTE — Progress Notes (Signed)
Hematology and Oncology Follow Up Visit  [name] [id number] Sep 08, 1961 58 y.o. 01/09/2018   Principle Diagnosis:   Metastatic neuroendocrine carcinoma  Von Hipple-Lindau Syndrome  Intermittent iron deficiency anemia secondary to GI bleeding.  Current Therapy:  Temodar/Xeloda - start 03/17/2016 - s/p c#3 - discontinued Somatuline 135m SQ q month Lutathera -- cycle #1/4 on 06/20/2019 IV iron as indicated-Feraheme given on 11/19/2018 Inlyta/Keytruda -- start on 08/08/2019     Interim History:  Mr.  CWiebelhausis back for followup.  I had him come back early so that I can talk to him about treating his renal cell carcinoma.  Unfortunately, he is not considered an operative candidate for his renal cell carcinoma.  I will think it is metastatic.  However, it is growing and will adversely affect his renal function if we do not treat this.  We did send off molecular profiling of his tumor.  Unfortunately, this came back with really no actionable mutations that we could target.  There was a high level of MET but I do not think there is any treatment that we have for this right now.  I think that it would be reasonable to treat him with Inlyta/pembrolizumab.  I think this would be considered first line therapy for renal cell cancer.  I think that he has fairly good prognostic category.  He is also getting his Lutathera for his metastatic neuroendocrine cancers.  I do not think that the treatment for his renal cell carcinoma will affect his neuroendocrine cancers.  He has had no problems with bleeding.  His blood pressure has been fairly well controlled.  He has a catheter for his bladder.  He does look a little bit distended today.  Overall, I would say that his performance status is ECOG 1.    Medications:  Current Outpatient Medications:  .  ACCU-CHEK SMARTVIEW test strip, , Disp: , Rfl: 10 .  baclofen (LIORESAL) 10 mg/mL SUSP, , Disp: , Rfl:  .  baclofen (LIORESAL) 20 MG  tablet, , Disp: , Rfl:  .  Balsam [place]-Castor Oil (VENELEX) OINT, Apply topically as needed, Disp: 60 g, Rfl: 4 .  capecitabine (XELODA) 500 MG tablet, Take by mouth., Disp: , Rfl:  .  chlorproMAZINE (THORAZINE) 10 MG tablet, Take by mouth., Disp: , Rfl:  .  diazepam (VALIUM) 5 MG tablet, TAKE 1 TABLET BY MOUTH 3 TIMES DAILY FOR SPASMS/ANXIETY, Disp: 90 tablet, Rfl: 1 .  diphenoxylate-atropine (LOMOTIL) 2.5-0.025 MG tablet, TAKE 1 TABLET BY MOUTH 4 TIMES DAILY AS NEEDED FOR DIARRHEA OR LOOSE STOOLS, Disp: 100 tablet, Rfl: 0 .  doxazosin (CARDURA) 1 MG tablet, Take 1 tablet (1 mg total) by mouth 2 (two) times daily. (Patient taking differently: Take 1 mg by mouth 2 (two) times daily as needed (blood pressure spikes.). ), Disp: 60 tablet, Rfl: 2 .  dronabinol (MARINOL) 5 MG capsule, TAKE 1 CAPSULE BY MOUTH TWICE DAILY WITH MEALS, Disp: 60 capsule, Rfl: 3 .  gabapentin (NEURONTIN) 300 MG capsule, TAKE 2 CAPSULES BY MOUTH 3 TIMES DAILY., Disp: 180 capsule, Rfl: 6 .  HYDROcodone-acetaminophen (NORCO/VICODIN) 5-325 MG tablet, Take 1-2 tablets by mouth every 6 (six) hours as needed for moderate pain., Disp: 90 tablet, Rfl: 0 .  HYDROmorphone (DILAUDID) 4 MG tablet, Take 1 tablet (4 mg total) by mouth every 6 (six) hours as needed for severe pain., Disp: 40 tablet, Rfl: 0 .  insulin glargine (LANTUS) 100 UNIT/ML injection, Inject 31 Units into the skin daily before  breakfast. , Disp: , Rfl:  .  insulin lispro (HUMALOG) 100 UNIT/ML injection, Inject 3-9 Units into the skin 3 (three) times daily before meals. SS, Disp: , Rfl:  .  lipase/protease/amylase (CREON) 12000 units CPEP capsule, Take by mouth., Disp: , Rfl:  .  midodrine (PROAMATINE) 5 MG tablet, TKAE 1/2 TABLET BY MOUTH TWICE A DAY AS NEEDED, Disp: 60 tablet, Rfl: 6 .  NON FORMULARY, Take by mouth., Disp: , Rfl:  .  oxybutynin (DITROPAN) 5 MG tablet, 5 mg 2 (two) times daily. , Disp: , Rfl: 3 .  promethazine (PHENERGAN) 12.5 MG tablet, Take 12.5 mg  by mouth every 6 (six) hours as needed for nausea. , Disp: , Rfl:  .  tiZANidine (ZANAFLEX) 4 MG tablet, TAKE 2 TABLETS BY MOUTH EVERY 6 HOURS AS NEEDED FOR PAIN, Disp: 30 tablet, Rfl: 3 .  traMADol (ULTRAM) 50 MG tablet, TAKE 1 TABLET BY MOUTH 3 TIMES DAILY, Disp: 90 tablet, Rfl: 2  Allergies:  Allergies  Allergen Reactions  . Citalopram Diarrhea  . Ciprocin-Fluocin-Procin [Fluocinolone Acetonide] Rash    Pt states this happened with IV Cipro. ABLE TO TAKE PO CIPRO.  . Other Rash    IV-CIPRO    Past Medical History, Surgical history, Social history, and Family History were reviewed and updated.  Review of Systems: Review of Systems  Constitutional: Negative.   HENT: Negative.   Eyes: Negative.   Respiratory: Negative.   Cardiovascular: Negative.   Gastrointestinal: Positive for diarrhea and nausea.  Genitourinary: Positive for frequency.  Musculoskeletal: Positive for myalgias and neck pain.  Skin: Negative.   Neurological: Negative.   Endo/Heme/Allergies: Negative.   Psychiatric/Behavioral: Negative.      Physical Exam:  oral temperature is 97.5 F (36.4 C) (abnormal). His blood pressure is 82/54 (abnormal) and his pulse is 94. His respiration is 18 and oxygen saturation is 99%.   Physical Exam Vitals signs reviewed.  HENT:     Head: Normocephalic and atraumatic.  Eyes:     Pupils: Pupils are equal, round, and reactive to light.  Neck:     Musculoskeletal: Normal range of motion.  Cardiovascular:     Rate and Rhythm: Normal rate and regular rhythm.     Heart sounds: Normal heart sounds.  Pulmonary:     Effort: Pulmonary effort is normal.     Breath sounds: Normal breath sounds.  Abdominal:     General: Bowel sounds are normal.     Palpations: Abdomen is soft.     Comments: He has a suprapubic catheter in place  Musculoskeletal: Normal range of motion.        General: No tenderness or deformity.     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  Skin:    General: Skin is warm and dry.     Findings: No erythema or rash.  Neurological:     Mental Status: He is alert and oriented to person, place, and time.  Psychiatric:        Behavior: Behavior normal.        Thought Content: Thought content normal.        Judgment: Judgment normal.   .  Lab Results  Component Value Date   WBC 11.3 (H) 01/09/2018   HGB 13.8 12/05/2017   HCT 43.8 01/09/2018   MCV 86.4 01/09/2018   PLT 288 01/09/2018     Chemistry      Component Value  Date/Time   NA 137 01/09/2018 1118   NA 137 12/05/2017 0840   NA 138 10/12/2016 0955   K 3.3 01/09/2018 1118   K 3.4 12/05/2017 0840   K 3.3 (L) 10/12/2016 0955   CL 104 01/09/2018 1118   CL 101 12/05/2017 0840   CO2 30 01/09/2018 1118   CO2 29 12/05/2017 0840   CO2 25 10/12/2016 0955   BUN 14 01/09/2018 1118   BUN 16 12/05/2017 0840   BUN 22.0 10/12/2016 0955   CREATININE 0.8 12/05/2017 0840   CREATININE 0.8 10/12/2016 0955      Component Value Date/Time   CALCIUM 8.8 01/09/2018 1118   CALCIUM 8.6 12/05/2017 0840   CALCIUM 9.0 10/12/2016 0955   ALKPHOS 101 (H) 01/09/2018 1118   ALKPHOS 139 (H) 12/05/2017 0840   ALKPHOS 159 (H) 10/12/2016 0955   AST 20 01/09/2018 1118   AST 45 (H) 10/12/2016 0955   ALT 12 01/09/2018 1118   ALT 19 12/05/2017 0840   ALT 29 10/12/2016 0955   BILITOT 0.8 01/09/2018 1118   BILITOT 0.62 10/12/2016 0955     Impression and Plan: Mr. Rozario is 58 year old gentleman with von Hippel-Lindau syndrome. He has multiple neuroendocrine tumors. 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 He's had multiple spinal surgeries.  We do have to worry about the kidney cancer.  I just hate to see this become an issue for him.  Again, I would have hoped that he would have had surgery [facility] for this.  However, they just not feel confident doing surgery.  Again, I think that the  Inlyta/pembrolizumab combination would be reasonable.  We will start him off at 5 mg p.o. twice daily of Inlyta.  We can certainly titrate the dose accordingly.  The pembrolizumab will be 200 mg IV every 3 weeks.  If everything looks good and he is doing well, then maybe we can go to the every 6-week dosing ibalizumab at 400 mg.  We will be starting an 2 weeks.  Again, I do not think that we will affect his Lutathera.  This is probably complicated.  He has 2 malignancies that we have to treat simultaneously.  I just do not want to see him with his renal function and he end up on dialysis.  We will plan to get him back to see [place] when he starts his second cycle of pembrolizumab.  I spent about 45 minutes with him today.    Volanda Napoleon, MD 2/4/20196:20 PM

## 2019-07-30 ENCOUNTER — Telehealth: Payer: Self-pay | Admitting: Hematology & Oncology

## 2019-07-30 LAB — LACTATE DEHYDROGENASE: LDH: 152 U/L (ref 98–192)

## 2019-07-30 MED FILL — BACLOFEN 20 MG TABLET: 20 | 15 days supply | Qty: 120 | Fill #2

## 2019-07-30 NOTE — Telephone Encounter (Signed)
lmom to inform of Sept appts per 8/21 LOS

## 2019-07-31 ENCOUNTER — Encounter: Payer: Self-pay | Admitting: Hematology & Oncology

## 2019-07-31 ENCOUNTER — Other Ambulatory Visit: Payer: Self-pay | Admitting: *Deleted

## 2019-07-31 ENCOUNTER — Other Ambulatory Visit: Payer: Self-pay | Admitting: Hematology & Oncology

## 2019-07-31 DIAGNOSIS — C7A8 Other malignant neuroendocrine tumors: Secondary | ICD-10-CM

## 2019-07-31 DIAGNOSIS — Z23 Encounter for immunization: Secondary | ICD-10-CM

## 2019-07-31 LAB — CHROMOGRANIN A: Chromogranin A (ng/mL): 53.5 ng/mL (ref 0.0–101.8)

## 2019-07-31 MED ORDER — GABAPENTIN 300 MG PO CAPS: 600.0000 mg | ORAL_CAPSULE | Freq: Three times a day (TID) | ORAL | 5 refills | Status: DC

## 2019-07-31 MED FILL — GABAPENTIN 300 MG CAPSULE: 300 | 20 days supply | Qty: 120 | Fill #0

## 2019-08-02 ENCOUNTER — Telehealth: Payer: Self-pay | Admitting: Pharmacist

## 2019-08-02 ENCOUNTER — Telehealth: Payer: Self-pay | Admitting: Pharmacy Technician

## 2019-08-02 MED FILL — INLYTA 5 MG TABLET: 5 | 30 days supply | Qty: 60 | Fill #0

## 2019-08-02 NOTE — Telephone Encounter (Signed)
Oral Oncology Patient Advocate Encounter  Prior Authorizations for Inlyta 1mg  & 5mg  have been approved.    PA# [id number] (5mg ), [id number] (1mg ) Effective dates: 07/27/2019 through 07/25/2020.    Max of 12 fills in this time period.  Patients co-pay is $0.00  Oral Oncology Clinic will continue to follow.   [facility] [place] Phone [phone] Fax [phone] 08/02/2019 2:18 PM

## 2019-08-02 NOTE — Telephone Encounter (Signed)
Oral Oncology Pharmacist Encounter  Received new prescription for Inlyta (axitinib) for the treatment of advance clear cell RCC in conjunction with pembrolizumab, planned duration until disease progression or unacceptable drug toxicity.  CMP from 07/27/2019 assessed, no relevant lab abnormalities. BP from 07/27/19 well controlled. Prescription dose and frequency assessed.   Current medication list in Epic reviewed, no DDIs with axitinib identified.  Prescription has been e-scribed to the [facility] for benefits analysis and approval.  Oral Oncology Clinic will continue to follow for insurance authorization, copayment issues, initial counseling and start date.  Darl Pikes, PharmD, BCPS, [facility] Hematology/Oncology Clinical Pharmacist [facility] [place] [facility] [phone]  08/02/2019 2:18 PM

## 2019-08-02 NOTE — Telephone Encounter (Signed)
Spoke to wife, Ellard Artis.  She will pick up Inlyta from [facility] this afternoon 8/27.  She knows that he will start this with Keytruda next week.

## 2019-08-02 NOTE — Telephone Encounter (Signed)
Oral Chemotherapy Pharmacist Encounter  Patient Education I spoke with patient's wife [name] for overview of new oral chemotherapy medication: Inlyta (axitinib) for the treatment of advance clear cell RCC in conjunction with pembrolizumab, planned duration until disease progression or unacceptable drug toxicity.   Counseled [name] on administration, dosing, side effects, monitoring, drug-food interactions, safe handling, storage, and disposal. Patient will take 1 tablet (5 mg total) by mouth 2 (two) times daily.  Side effects include but not limited to: HTN, diarhea, decreased wbc/hgb, fatigue, N/V.  [name] mentioned that [name] has issues with both hypotension and hypertension. [name] has random hypertensive crisis events due to his spinal issues. According to his wife he can reach BP in the 200s and this can occure times a day or not happen for months at a time. Due to the risk of hypertension with Inlyta, we discussed the importance of monitoring his BP at home and look for elevation once he is started on the Inlyta. She has medication at home to handle a hypertension event. I also recommended that if he has an event that she should hold his Inlyta until his BP is controlled again to prevent from worsening the issue. We discussed how his dose can be decreased if it is causing hypertension.  Reviewed the importance of keeping a medication schedule and plan for any missed doses.  [name] voiced understanding and appreciation. All questions answered. Medication handout placed in the mail.  Provided [name] with [facility] phone number. She knows to call the office with questions or concerns. Oral Chemotherapy [facility] will continue to follow.  Darl Pikes, PharmD, BCPS, [facility] Hematology/Oncology Clinical Pharmacist [facility] [phone]  08/02/2019 3:35 PM

## 2019-08-06 MED FILL — LANTUS 100 UNITS/ML VIAL: 100 | 28 days supply | Qty: 10 | Fill #3

## 2019-08-07 ENCOUNTER — Ambulatory Visit (HOSPITAL_COMMUNITY): Payer: 59

## 2019-08-08 ENCOUNTER — Other Ambulatory Visit: Payer: Self-pay | Admitting: Family

## 2019-08-08 DIAGNOSIS — C642 Malignant neoplasm of left kidney, except renal pelvis: Secondary | ICD-10-CM

## 2019-08-08 DIAGNOSIS — C7A8 Other malignant neuroendocrine tumors: Secondary | ICD-10-CM

## 2019-08-08 DIAGNOSIS — D500 Iron deficiency anemia secondary to blood loss (chronic): Secondary | ICD-10-CM

## 2019-08-09 ENCOUNTER — Other Ambulatory Visit: Payer: Self-pay

## 2019-08-09 ENCOUNTER — Ambulatory Visit: Payer: 59 | Admitting: Hematology & Oncology

## 2019-08-09 ENCOUNTER — Inpatient Hospital Stay: Payer: 59 | Attending: Hematology & Oncology

## 2019-08-09 ENCOUNTER — Inpatient Hospital Stay: Payer: 59

## 2019-08-09 VITALS — BP 146/94 | HR 64 | Temp 97.1°F | Resp 20

## 2019-08-09 DIAGNOSIS — Q858 Other phakomatoses, not elsewhere classified: Secondary | ICD-10-CM | POA: Insufficient documentation

## 2019-08-09 DIAGNOSIS — K922 Gastrointestinal hemorrhage, unspecified: Secondary | ICD-10-CM | POA: Insufficient documentation

## 2019-08-09 DIAGNOSIS — Z5112 Encounter for antineoplastic immunotherapy: Secondary | ICD-10-CM | POA: Diagnosis not present

## 2019-08-09 DIAGNOSIS — D500 Iron deficiency anemia secondary to blood loss (chronic): Secondary | ICD-10-CM

## 2019-08-09 DIAGNOSIS — C642 Malignant neoplasm of left kidney, except renal pelvis: Secondary | ICD-10-CM

## 2019-08-09 DIAGNOSIS — C7A8 Other malignant neuroendocrine tumors: Secondary | ICD-10-CM | POA: Diagnosis not present

## 2019-08-09 DIAGNOSIS — Z79899 Other long term (current) drug therapy: Secondary | ICD-10-CM | POA: Diagnosis not present

## 2019-08-09 LAB — CMP (CANCER CENTER ONLY)
ALT: 11 U/L (ref 0–44)
AST: 16 U/L (ref 15–41)
Albumin: 3.2 g/dL — ABNORMAL LOW (ref 3.5–5.0)
Alkaline Phosphatase: 107 U/L (ref 38–126)
Anion gap: 8 (ref 5–15)
BUN: 18 mg/dL (ref 6–20)
CO2: 24 mmol/L (ref 22–32)
Calcium: 8.2 mg/dL — ABNORMAL LOW (ref 8.9–10.3)
Chloride: 102 mmol/L (ref 98–111)
Creatinine: 0.67 mg/dL (ref 0.61–1.24)
GFR, Est AFR Am: >60 mL/min (ref >60)
GFR, Estimated: >60 mL/min (ref >60)
Glucose, Bld: 310 mg/dL — ABNORMAL HIGH (ref 70–99)
Potassium: 4 mmol/L (ref 3.5–5.1)
Sodium: 134 mmol/L — ABNORMAL LOW (ref 135–145)
Total Bilirubin: 0.5 mg/dL (ref 0.3–1.2)
Total Protein: 6.6 g/dL (ref 6.5–8.1)

## 2019-08-09 LAB — CBC WITH DIFFERENTIAL (CANCER CENTER ONLY)
Abs Immature Granulocytes: 0.02 10*3/uL (ref 0.00–0.07)
Basophils Absolute: 0 10*3/uL (ref 0.0–0.1)
Basophils Relative: 0 %
Eosinophils Absolute: 0.1 10*3/uL (ref 0.0–0.5)
Eosinophils Relative: 3 %
HCT: 37.8 % — ABNORMAL LOW (ref 39.0–52.0)
Hemoglobin: 12.4 g/dL — ABNORMAL LOW (ref 13.0–17.0)
Immature Granulocytes: 0 %
Lymphocytes Relative: 20 %
Lymphs Abs: 1 10*3/uL (ref 0.7–4.0)
MCH: 29.1 pg (ref 26.0–34.0)
MCHC: 32.8 g/dL (ref 30.0–36.0)
MCV: 88.7 fL (ref 80.0–100.0)
Monocytes Absolute: 0.5 10*3/uL (ref 0.1–1.0)
Monocytes Relative: 10 %
Neutro Abs: 3.1 10*3/uL (ref 1.7–7.7)
Neutrophils Relative %: 67 %
Platelet Count: 160 10*3/uL (ref 150–400)
RBC: 4.26 MIL/uL (ref 4.22–5.81)
RDW: 14.9 % (ref 11.5–15.5)
WBC Count: 4.8 10*3/uL (ref 4.0–10.5)
nRBC: 0 % (ref 0.0–0.2)

## 2019-08-09 LAB — LACTATE DEHYDROGENASE: LDH: 136 U/L (ref 98–192)

## 2019-08-09 MED ORDER — PROCHLORPERAZINE MALEATE 10 MG PO TABS: 10.0000 mg | ORAL_TABLET | Freq: Four times a day (QID) | ORAL | 1 refills | Status: DC | PRN

## 2019-08-09 MED ORDER — LANREOTIDE ACETATE 120 MG/0.5ML ~~LOC~~ SOLN
SUBCUTANEOUS | Status: AC
  Filled 2019-08-09: qty 120

## 2019-08-09 MED ORDER — SODIUM CHLORIDE 0.9 % IV SOLN
Freq: Once | INTRAVENOUS | Status: AC
  Administered 2019-08-09: 13:00:00 via INTRAVENOUS
  Filled 2019-08-09: qty 250

## 2019-08-09 MED ORDER — SODIUM CHLORIDE 0.9 % IV SOLN
200.0000 mg | Freq: Once | INTRAVENOUS | Status: AC
  Administered 2019-08-09: 200 mg via INTRAVENOUS
  Filled 2019-08-09: qty 8

## 2019-08-09 MED ORDER — ONDANSETRON HCL 8 MG PO TABS: 8.0000 mg | ORAL_TABLET | Freq: Two times a day (BID) | ORAL | 1 refills | Status: DC | PRN

## 2019-08-09 MED ORDER — LANREOTIDE ACETATE 120 MG/0.5ML ~~LOC~~ SOLN
120.0000 mg | Freq: Once | SUBCUTANEOUS | Status: AC
  Administered 2019-08-09: 14:00:00 120 mg via SUBCUTANEOUS

## 2019-08-09 MED FILL — PROCHLORPERAZINE 10 MG TAB: 10 | 7 days supply | Qty: 30 | Fill #0

## 2019-08-09 MED FILL — ONDANSETRON HCL 8 MG TABLET: 8 | 15 days supply | Qty: 30 | Fill #0

## 2019-08-09 NOTE — Patient Instructions (Addendum)
Pembrolizumab injection What is this medicine? PEMBROLIZUMAB (pem broe liz ue mab) is a monoclonal antibody. It is used to treat bladder cancer, cervical cancer, endometrial cancer, esophageal cancer, head and neck cancer, hepatocellular cancer, Hodgkin lymphoma, kidney cancer, lymphoma, melanoma, Merkel cell carcinoma, lung cancer, stomach cancer, urothelial cancer, and cancers that have a certain genetic condition. This medicine may be used for other purposes; ask your health care provider or pharmacist if you have questions. COMMON BRAND NAME(S): Keytruda What should I tell my health care provider before I take this medicine? They need to know if you have any of these conditions:  diabetes  immune system problems  inflammatory bowel disease  liver disease  lung or breathing disease  lupus  received or scheduled to receive an organ transplant or a stem-cell transplant that uses donor stem cells  an unusual or allergic reaction to pembrolizumab, other medicines, foods, dyes, or preservatives  pregnant or trying to get pregnant  breast-feeding How should I use this medicine? This medicine is for infusion into a vein. It is given by a health care professional in a hospital or clinic setting. A special MedGuide will be given to you before each treatment. Be sure to read this information carefully each time. Talk to your pediatrician regarding the use of this medicine in children. While this drug may be prescribed for selected conditions, precautions do apply. Overdosage: If you think you have taken too much of this medicine contact a poison control center or emergency room at once. NOTE: This medicine is only for you. Do not share this medicine with others. What if I miss a dose? It is important not to miss your dose. Call your doctor or health care professional if you are unable to keep an appointment. What may interact with this medicine? Interactions have not been studied. Give  your health care provider a list of all the medicines, herbs, non-prescription drugs, or dietary supplements you use. Also tell them if you smoke, drink alcohol, or use illegal drugs. Some items may interact with your medicine. This list may not describe all possible interactions. Give your health care provider a list of all the medicines, herbs, non-prescription drugs, or dietary supplements you use. Also tell them if you smoke, drink alcohol, or use illegal drugs. Some items may interact with your medicine. What should I watch for while using this medicine? Your condition will be monitored carefully while you are receiving this medicine. You may need blood work done while you are taking this medicine. Do not become pregnant while taking this medicine or for 4 months after stopping it. Women should inform their doctor if they wish to become pregnant or think they might be pregnant. There is a potential for serious side effects to an unborn child. Talk to your health care professional or pharmacist for more information. Do not breast-feed an infant while taking this medicine or for 4 months after the last dose. What side effects may I notice from receiving this medicine? Side effects that you should report to your doctor or health care professional as soon as possible:  allergic reactions like skin rash, itching or hives, swelling of the face, lips, or tongue  bloody or black, tarry  breathing problems  changes in vision  chest pain  chills  confusion  constipation  cough  diarrhea  dizziness or feeling faint or lightheaded  fast or irregular heartbeat  fever  flushing  hair loss  joint pain  low blood counts - this   medicine may decrease the number of white blood cells, red blood cells and platelets. You may be at increased risk for infections and bleeding.  muscle pain  muscle weakness  persistent headache  redness, blistering, peeling or loosening of the skin,  including inside the mouth  signs and symptoms of high blood sugar such as dizziness; dry mouth; dry skin; fruity breath; nausea; stomach pain; increased hunger or thirst; increased urination  signs and symptoms of kidney injury like trouble passing urine or change in the amount of urine  signs and symptoms of liver injury like dark urine, light-colored stools, loss of appetite, nausea, right upper belly pain, yellowing of the eyes or skin  sweating  swollen lymph nodes  weight loss Side effects that usually do not require medical attention (report to your doctor or health care professional if they continue or are bothersome):  decreased appetite  muscle pain  tiredness This list may not describe all possible side effects. Call your doctor for medical advice about side effects. You may report side effects to FDA at 1-800-FDA-1088. Where should I keep my medicine? This drug is given in a hospital or clinic and will not be stored at home. NOTE: This sheet is a summary. It may not cover all possible information. If you have questions about this medicine, talk to your doctor, pharmacist, or health care provider.  2020 Elsevier/Gold Standard (2018-12-19 13:46:58)  

## 2019-08-10 LAB — IRON AND TIBC
Iron: 48 ug/dL (ref 42–163)
Saturation Ratios: 22 % (ref 20–55)
TIBC: 214 ug/dL (ref 202–409)
UIBC: 166 ug/dL (ref 117–376)

## 2019-08-10 LAB — FERRITIN: Ferritin: 432 ng/mL — ABNORMAL HIGH (ref 24–336)

## 2019-08-14 ENCOUNTER — Other Ambulatory Visit: Payer: Self-pay | Admitting: Hematology & Oncology

## 2019-08-14 ENCOUNTER — Telehealth: Payer: Self-pay | Admitting: *Deleted

## 2019-08-14 ENCOUNTER — Other Ambulatory Visit: Payer: Self-pay | Admitting: *Deleted

## 2019-08-14 MED ORDER — DOXAZOSIN MESYLATE 2 MG PO TABS: 2.0000 mg | ORAL_TABLET | Freq: Two times a day (BID) | ORAL | 0 refills | Status: DC

## 2019-08-14 MED ORDER — DRONABINOL 5 MG PO CAPS: 5.0000 mg | ORAL_CAPSULE | Freq: Two times a day (BID) | ORAL | 3 refills | Status: DC

## 2019-08-14 MED FILL — CREON DR 24,000 UNITS CAP: 24000-76000 | 30 days supply | Qty: 270 | Fill #2

## 2019-08-14 MED FILL — DRONABINOL 5 MG CAP: 5 | 15 days supply | Qty: 30 | Fill #0

## 2019-08-14 NOTE — Telephone Encounter (Signed)
Call received from patients wife Wendi to inform Dr. Marin Olp that pt.'s BP was elevated, 190's/100's  last week after receiving Keytruda and that she has started pt on Cardura 2 mg BID.  Pt.'s wife states that pt.'s BP is now 150's/70's. Dr. Marin Olp notified and no new orders received.

## 2019-08-16 ENCOUNTER — Encounter: Payer: Self-pay | Admitting: Hematology & Oncology

## 2019-08-17 ENCOUNTER — Encounter: Payer: Self-pay | Admitting: Hematology & Oncology

## 2019-08-17 ENCOUNTER — Other Ambulatory Visit: Payer: Self-pay | Admitting: *Deleted

## 2019-08-17 MED ORDER — FENTANYL 12 MCG/HR TD PT72: MEDICATED_PATCH | TRANSDERMAL | 0 refills | Status: DC

## 2019-08-17 MED FILL — FENTANYL 12 MCG/HR PT72: 12 | 30 days supply | Qty: 10 | Fill #0

## 2019-08-20 MED FILL — GABAPENTIN 300 MG CAPSULE: 300 | 20 days supply | Qty: 120 | Fill #1

## 2019-08-21 ENCOUNTER — Other Ambulatory Visit: Payer: Self-pay

## 2019-08-21 ENCOUNTER — Ambulatory Visit (HOSPITAL_COMMUNITY)
Admission: RE | Admit: 2019-08-21 | Discharge: 2019-08-21 | Disposition: A | Discharge status: Home / Self Care | Payer: 59 | Source: Ambulatory Visit | Attending: Hematology & Oncology | Admitting: Hematology & Oncology

## 2019-08-21 DIAGNOSIS — C7A8 Other malignant neuroendocrine tumors: Secondary | ICD-10-CM | POA: Diagnosis not present

## 2019-08-21 DIAGNOSIS — C7A1 Malignant poorly differentiated neuroendocrine tumors: Secondary | ICD-10-CM | POA: Diagnosis not present

## 2019-08-21 LAB — COMPREHENSIVE METABOLIC PANEL
ALT: 14 U/L (ref 0–44)
AST: 19 U/L (ref 15–41)
Albumin: 2.5 g/dL — ABNORMAL LOW (ref 3.5–5.0)
Alkaline Phosphatase: 105 U/L (ref 38–126)
Anion gap: 11 (ref 5–15)
BUN: 16 mg/dL (ref 6–20)
CO2: 24 mmol/L (ref 22–32)
Calcium: 8 mg/dL — ABNORMAL LOW (ref 8.9–10.3)
Chloride: 102 mmol/L (ref 98–111)
Creatinine, Ser: 0.73 mg/dL (ref 0.61–1.24)
GFR calc Af Amer: >60 mL/min (ref >60)
GFR calc non Af Amer: >60 mL/min (ref >60)
Glucose, Bld: 84 mg/dL (ref 70–99)
Potassium: 3.5 mmol/L (ref 3.5–5.1)
Sodium: 137 mmol/L (ref 135–145)
Total Bilirubin: 0.6 mg/dL (ref 0.3–1.2)
Total Protein: 6.4 g/dL — ABNORMAL LOW (ref 6.5–8.1)

## 2019-08-21 LAB — CBC WITH DIFFERENTIAL/PLATELET
Abs Immature Granulocytes: 0.02 10*3/uL (ref 0.00–0.07)
Basophils Absolute: 0.1 10*3/uL (ref 0.0–0.1)
Basophils Relative: 1 %
Eosinophils Absolute: 0.1 10*3/uL (ref 0.0–0.5)
Eosinophils Relative: 2 %
HCT: 39.4 % (ref 39.0–52.0)
Hemoglobin: 12.8 g/dL — ABNORMAL LOW (ref 13.0–17.0)
Immature Granulocytes: 0 %
Lymphocytes Relative: 13 %
Lymphs Abs: 0.8 10*3/uL (ref 0.7–4.0)
MCH: 28.8 pg (ref 26.0–34.0)
MCHC: 32.5 g/dL (ref 30.0–36.0)
MCV: 88.5 fL (ref 80.0–100.0)
Monocytes Absolute: 0.7 10*3/uL (ref 0.1–1.0)
Monocytes Relative: 11 %
Neutro Abs: 4.7 10*3/uL (ref 1.7–7.7)
Neutrophils Relative %: 73 %
Platelets: 157 10*3/uL (ref 150–400)
RBC: 4.45 MIL/uL (ref 4.22–5.81)
RDW: 15 % (ref 11.5–15.5)
WBC: 6.4 10*3/uL (ref 4.0–10.5)
nRBC: 0 % (ref 0.0–0.2)

## 2019-08-21 LAB — GLUCOSE, CAPILLARY
Glucose-Capillary: 68 mg/dL — ABNORMAL LOW (ref 70–99)
Glucose-Capillary: 128 mg/dL — ABNORMAL HIGH (ref 70–99)
Glucose-Capillary: 84 mg/dL (ref 70–99)
Glucose-Capillary: 186 mg/dL — ABNORMAL HIGH (ref 70–99)

## 2019-08-21 MED ORDER — OCTREOTIDE ACETATE 30 MG IM KIT
PACK | INTRAMUSCULAR | Status: AC
  Administered 2019-08-21: 30 mg via INTRAMUSCULAR
  Filled 2019-08-21: qty 1

## 2019-08-21 MED ORDER — OCTREOTIDE ACETATE 500 MCG/ML IJ SOLN: 500.0000 ug | Freq: Once | INTRAMUSCULAR | Status: DC | PRN

## 2019-08-21 MED ORDER — PROCHLORPERAZINE EDISYLATE 10 MG/2ML IJ SOLN: 10.0000 mg | Freq: Four times a day (QID) | INTRAMUSCULAR | Status: DC | PRN

## 2019-08-21 MED ORDER — OCTREOTIDE ACETATE 30 MG IM KIT
30.0000 mg | PACK | Freq: Once | INTRAMUSCULAR | Status: AC
  Administered 2019-08-21: 16:00:00 30 mg via INTRAMUSCULAR

## 2019-08-21 MED ORDER — SODIUM CHLORIDE 0.9 % IV SOLN: 500.0000 mL | Freq: Once | INTRAVENOUS | Status: DC

## 2019-08-21 MED ORDER — DEXTROSE 50 % IV SOLN
INTRAVENOUS | Status: AC
  Administered 2019-08-21: 25 mL via INTRAVENOUS
  Filled 2019-08-21: qty 50

## 2019-08-21 MED ORDER — ONDANSETRON HCL 8 MG PO TABS: 8.0000 mg | ORAL_TABLET | Freq: Two times a day (BID) | ORAL | 0 refills | Status: DC | PRN

## 2019-08-21 MED ORDER — DEXTROSE 50 % IV SOLN
25.0000 mL | Freq: Once | INTRAVENOUS | Status: AC
  Administered 2019-08-21: 09:00:00 25 mL via INTRAVENOUS

## 2019-08-21 MED ORDER — AMINO ACID RADIOPROTECTANT - L-LYSINE 2.5%/L-ARGININE 2.5% IN NS
250.0000 mL/h | INTRAVENOUS | Status: AC
  Administered 2019-08-21: 10:00:00 250 mL/h via INTRAVENOUS
  Filled 2019-08-21: qty 1000

## 2019-08-21 MED ORDER — LUTETIUM LU 177 DOTATATE 370 MBQ/ML IV SOLN
200.0000 | Freq: Once | INTRAVENOUS | Status: AC
  Administered 2019-08-21: 11:00:00 203 via INTRAVENOUS

## 2019-08-21 MED ORDER — SODIUM CHLORIDE 0.9 % IV SOLN
8.0000 mg | Freq: Once | INTRAVENOUS | Status: AC
  Administered 2019-08-21: 8 mg via INTRAVENOUS
  Filled 2019-08-21: qty 4

## 2019-08-22 ENCOUNTER — Telehealth: Payer: Self-pay | Admitting: *Deleted

## 2019-08-22 DIAGNOSIS — Z794 Long term (current) use of insulin: Secondary | ICD-10-CM | POA: Diagnosis not present

## 2019-08-22 DIAGNOSIS — E109 Type 1 diabetes mellitus without complications: Secondary | ICD-10-CM | POA: Diagnosis not present

## 2019-08-22 NOTE — Telephone Encounter (Signed)
Received call from patient wife Ellard Artis wanting to let us know that patient has been having trouble regulating his system.  He has continued to have issues with hypertension. And he has been bradycardic with pulse into the 30s'and 40s.  Patient BS has been an issue as his bs are running in 50- 80s without insulin.  Even when eating well patient does not get a big rise in Bs.  Dr Marin Olp consulted about this.  States possibly Beryle Flock could be the cause of bradycardia.  BS are usually elevated with Inlyta.  Ellard Artis will talk to his endocrinologist about the [facility] and will see Dr. Johnette Abraham on Wednesday 08/29/2019

## 2019-08-24 ENCOUNTER — Encounter: Payer: Self-pay | Admitting: Hematology & Oncology

## 2019-08-27 MED FILL — SOLIFENACIN SUCCINATE 5 MG: 5 | 30 days supply | Qty: 30 | Fill #5

## 2019-08-27 MED FILL — OXYBUTYNIN CHLORIDE 5 MG TA: 5 | 30 days supply | Qty: 60 | Fill #1

## 2019-08-27 MED FILL — traMADol HCL 50 MG TABS: 50 | 30 days supply | Qty: 90 | Fill #2

## 2019-08-29 ENCOUNTER — Inpatient Hospital Stay: Payer: 59

## 2019-08-29 ENCOUNTER — Inpatient Hospital Stay: Payer: 59 | Admitting: Hematology & Oncology

## 2019-08-31 MED FILL — DRONABINOL 5 MG CAP: 5 | 15 days supply | Qty: 30 | Fill #1

## 2019-09-06 ENCOUNTER — Other Ambulatory Visit: Payer: 59

## 2019-09-06 ENCOUNTER — Ambulatory Visit: Payer: 59 | Admitting: Hematology & Oncology

## 2019-09-06 ENCOUNTER — Ambulatory Visit: Payer: 59

## 2019-09-10 MED FILL — GABAPENTIN 300 MG CAPSULE: 300 | 20 days supply | Qty: 120 | Fill #2

## 2019-09-10 MED FILL — CREON DR 24,000 UNITS CAP: 24000-76000 | 30 days supply | Qty: 270 | Fill #3

## 2019-09-12 NOTE — Addendum Note (Signed)
Encounter addended by: Arbutus Ped, [place] on: 09/12/2019 11:05 AM  Actions taken: i-Vent created or edited

## 2019-09-18 MED FILL — DRONABINOL 5 MG CAP: 5 | 15 days supply | Qty: 30 | Fill #2

## 2019-09-19 ENCOUNTER — Inpatient Hospital Stay: Payer: 59 | Attending: Hematology & Oncology

## 2019-09-19 ENCOUNTER — Other Ambulatory Visit: Payer: Self-pay

## 2019-09-19 ENCOUNTER — Inpatient Hospital Stay: Payer: 59

## 2019-09-19 ENCOUNTER — Other Ambulatory Visit: Payer: Self-pay | Admitting: *Deleted

## 2019-09-19 ENCOUNTER — Inpatient Hospital Stay (HOSPITAL_BASED_OUTPATIENT_CLINIC_OR_DEPARTMENT_OTHER): Payer: 59 | Admitting: Hematology & Oncology

## 2019-09-19 VITALS — BP 105/75 | HR 81 | Temp 97.1°F | Resp 18 | Ht 74.0 in

## 2019-09-19 DIAGNOSIS — E119 Type 2 diabetes mellitus without complications: Secondary | ICD-10-CM | POA: Diagnosis not present

## 2019-09-19 DIAGNOSIS — E891 Postprocedural hypoinsulinemia: Secondary | ICD-10-CM

## 2019-09-19 DIAGNOSIS — C7A8 Other malignant neuroendocrine tumors: Secondary | ICD-10-CM | POA: Insufficient documentation

## 2019-09-19 DIAGNOSIS — Z794 Long term (current) use of insulin: Secondary | ICD-10-CM | POA: Insufficient documentation

## 2019-09-19 DIAGNOSIS — E139 Other specified diabetes mellitus without complications: Secondary | ICD-10-CM

## 2019-09-19 DIAGNOSIS — Z23 Encounter for immunization: Secondary | ICD-10-CM | POA: Insufficient documentation

## 2019-09-19 DIAGNOSIS — E109 Type 1 diabetes mellitus without complications: Secondary | ICD-10-CM | POA: Diagnosis not present

## 2019-09-19 DIAGNOSIS — Z79899 Other long term (current) drug therapy: Secondary | ICD-10-CM | POA: Insufficient documentation

## 2019-09-19 DIAGNOSIS — D500 Iron deficiency anemia secondary to blood loss (chronic): Secondary | ICD-10-CM | POA: Diagnosis not present

## 2019-09-19 DIAGNOSIS — C642 Malignant neoplasm of left kidney, except renal pelvis: Secondary | ICD-10-CM

## 2019-09-19 DIAGNOSIS — Q858 Other phakomatoses, not elsewhere classified: Secondary | ICD-10-CM | POA: Insufficient documentation

## 2019-09-19 DIAGNOSIS — K922 Gastrointestinal hemorrhage, unspecified: Secondary | ICD-10-CM | POA: Diagnosis not present

## 2019-09-19 DIAGNOSIS — Z90410 Acquired total absence of pancreas: Secondary | ICD-10-CM

## 2019-09-19 LAB — CBC WITH DIFFERENTIAL (CANCER CENTER ONLY)
Abs Immature Granulocytes: 0.03 10*3/uL (ref 0.00–0.07)
Basophils Absolute: 0 10*3/uL (ref 0.0–0.1)
Basophils Relative: 1 %
Eosinophils Absolute: 0.1 10*3/uL (ref 0.0–0.5)
Eosinophils Relative: 2 %
HCT: 37.6 % — ABNORMAL LOW (ref 39.0–52.0)
Hemoglobin: 12 g/dL — ABNORMAL LOW (ref 13.0–17.0)
Immature Granulocytes: 1 %
Lymphocytes Relative: 9 %
Lymphs Abs: 0.6 10*3/uL — ABNORMAL LOW (ref 0.7–4.0)
MCH: 29.6 pg (ref 26.0–34.0)
MCHC: 31.9 g/dL (ref 30.0–36.0)
MCV: 92.8 fL (ref 80.0–100.0)
Monocytes Absolute: 0.4 10*3/uL (ref 0.1–1.0)
Monocytes Relative: 6 %
Neutro Abs: 5.5 10*3/uL (ref 1.7–7.7)
Neutrophils Relative %: 81 %
Platelet Count: 135 10*3/uL — ABNORMAL LOW (ref 150–400)
RBC: 4.05 MIL/uL — ABNORMAL LOW (ref 4.22–5.81)
RDW: 15 % (ref 11.5–15.5)
WBC Count: 6.6 10*3/uL (ref 4.0–10.5)
nRBC: 0 % (ref 0.0–0.2)

## 2019-09-19 LAB — CMP (CANCER CENTER ONLY)
ALT: 9 U/L (ref 0–44)
AST: 16 U/L (ref 15–41)
Albumin: 3.1 g/dL — ABNORMAL LOW (ref 3.5–5.0)
Alkaline Phosphatase: 91 U/L (ref 38–126)
Anion gap: 5 (ref 5–15)
BUN: 21 mg/dL — ABNORMAL HIGH (ref 6–20)
CO2: 29 mmol/L (ref 22–32)
Calcium: 8.6 mg/dL — ABNORMAL LOW (ref 8.9–10.3)
Chloride: 100 mmol/L (ref 98–111)
Creatinine: 0.64 mg/dL (ref 0.61–1.24)
GFR, Est AFR Am: >60 mL/min (ref >60)
GFR, Estimated: >60 mL/min (ref >60)
Glucose, Bld: 176 mg/dL — ABNORMAL HIGH (ref 70–99)
Potassium: 3.9 mmol/L (ref 3.5–5.1)
Sodium: 134 mmol/L — ABNORMAL LOW (ref 135–145)
Total Bilirubin: 0.6 mg/dL (ref 0.3–1.2)
Total Protein: 6.7 g/dL (ref 6.5–8.1)

## 2019-09-19 LAB — HEMOGLOBIN A1C
Hgb A1c MFr Bld: 6.7 % — ABNORMAL HIGH (ref 4.8–5.6)
Mean Plasma Glucose: 145.59 mg/dL

## 2019-09-19 MED ORDER — INFLUENZA VAC SPLIT QUAD 0.5 ML IM SUSY
PREFILLED_SYRINGE | INTRAMUSCULAR | Status: AC
  Filled 2019-09-19: qty 0.5

## 2019-09-19 MED ORDER — LANREOTIDE ACETATE 120 MG/0.5ML ~~LOC~~ SOLN
SUBCUTANEOUS | Status: AC
  Filled 2019-09-19: qty 120

## 2019-09-19 MED ORDER — LANREOTIDE ACETATE 120 MG/0.5ML ~~LOC~~ SOLN
120.0000 mg | Freq: Once | SUBCUTANEOUS | Status: AC
  Administered 2019-09-19: 16:00:00 120 mg via SUBCUTANEOUS

## 2019-09-19 MED ORDER — INFLUENZA VAC SPLIT QUAD 0.5 ML IM SUSY
0.5000 mL | PREFILLED_SYRINGE | Freq: Once | INTRAMUSCULAR | Status: AC
  Administered 2019-09-19: 0.5 mL via INTRAMUSCULAR

## 2019-09-19 NOTE — Patient Instructions (Signed)
Lanreotide injection What is this medicine? LANREOTIDE (lan REE oh tide) is used to reduce blood levels of growth hormone in patients with a condition called acromegaly. It also works to slow or stop tumor growth in patients with neuroendocrine tumors and treat carcinoid syndrome. This medicine may be used for other purposes; ask your health care provider or pharmacist if you have questions. COMMON BRAND NAME(S): Somatuline [place] What should I tell my health care provider before I take this medicine? They need to know if you have any of these conditions:  diabetes  gallbladder disease  heart disease  kidney disease  liver disease  thyroid disease  an unusual or allergic reaction to lanreotide, other medicines, foods, dyes, or preservatives  pregnant or trying to get pregnant  breast-feeding How should I use this medicine? This medicine is for injection under the skin. It is given by a health care professional in a hospital or clinic setting. Contact your pediatrician or health care professional regarding the use of this medicine in children. Special care may be needed. Overdosage: If you think you have taken too much of this medicine contact a poison control center or emergency room at once. NOTE: This medicine is only for you. Do not share this medicine with others. What if I miss a dose? It is important not to miss your dose. Call your doctor or health care professional if you are unable to keep an appointment. What may interact with this medicine? This medicine may interact with the following medications:  bromocriptine  cyclosporine  certain medicines for blood pressure, heart disease, irregular heart beat  certain medicines for diabetes  quinidine  terfenadine This list may not describe all possible interactions. Give your health care provider a list of all the medicines, herbs, non-prescription drugs, or dietary supplements you use. Also tell them if you smoke,  drink alcohol, or use illegal drugs. Some items may interact with your medicine. What should I watch for while using this medicine? Tell your doctor or healthcare professional if your symptoms do not start to get better or if they get worse. Visit your doctor or health care professional for regular checks on your progress. Your condition will be monitored carefully while you are receiving this medicine. This medicine may increase blood sugar. Ask your healthcare provider if changes in diet or medicines are needed if you have diabetes. You may need blood work done while you are taking this medicine. Women should inform their doctor if they wish to become pregnant or think they might be pregnant. There is a potential for serious side effects to an unborn child. Talk to your health care professional or pharmacist for more information. Do not breast-feed an infant while taking this medicine or for 6 months after stopping it. This medicine has caused ovarian failure in some women. This medicine may interfere with the ability to have a child. Talk with your doctor or health care professional if you are concerned about your fertility. What side effects may I notice from receiving this medicine? Side effects that you should report to your doctor or health care professional as soon as possible:  allergic reactions like skin rash, itching or hives, swelling of the face, lips, or tongue  increased blood pressure  severe stomach pain  signs and symptoms of hgh blood sugar such as being more thirsty or hungry or having to urinate more than normal. You may also feel very tired or have blurry vision.  signs and symptoms of low blood   sugar such as feeling anxious; confusion; dizziness; increased hunger; unusually weak or tired; sweating; shakiness; cold; irritable; headache; blurred vision; fast heartbeat; loss of consciousness  unusually slow heartbeat Side effects that usually do not require medical  attention (report to your doctor or health care professional if they continue or are bothersome):  constipation  diarrhea  dizziness  headache  muscle pain  muscle spasms  nausea  pain, redness, or irritation at site where injected This list may not describe all possible side effects. Call your doctor for medical advice about side effects. You may report side effects to FDA at 1-800-FDA-1088. Where should I keep my medicine? This drug is given in a hospital or clinic and will not be stored at home. NOTE: This sheet is a summary. It may not cover all possible information. If you have questions about this medicine, talk to your doctor, pharmacist, or health care provider.  2020 Elsevier/Gold Standard (2018-08-31 09:13:08)  

## 2019-09-19 NOTE — Progress Notes (Signed)
Hematology and Oncology Follow Up Visit  [name] [id number] December 07, 1960 58 y.o. 01/09/2018   Principle Diagnosis:   Metastatic neuroendocrine carcinoma  Von Hipple-Lindau Syndrome  Intermittent iron deficiency anemia secondary to GI bleeding.  Current Therapy:  Temodar/Xeloda - start 03/17/2016 - s/p c#3 - discontinued Somatuline 11m SQ q month Lutathera -- cycle #1/4 on 06/20/2019 IV iron as indicated-Feraheme given on 11/19/2018 Inlyta/Keytruda -- start on 08/08/2019 -- d/c on 08/26/2019 due to toxicity     Interim History:  Mr.  CGathrightis back for followup.  He really had a tough time with the Inlyta/pembrolizumab.  I suspect this probably was mostly from the IB[place]  He had markedly elevated blood pressure.  His blood sugars were quite labile.  He was having issues with abdominal pain.  He just felt horrible.  He said he was not going to take this any longer as he did not want to have his quality of life compromised like this.  I cannot agree with him anymore.  I feel that this is about quality of life.  I really thought that he would do well with the Inlyta/Keytruda combination.  However, I suspect that he just has a genetic issue that might cause toxicity with the Inlyta for the most part.  I would like to do another CAT scan on him so we can see how everything is looking.  I still have not given up on immunotherapy.  I also am thinking about the possibility of using the MET inhibitor  - capmatinib (Tabrecta) which I think might be approved for tumors that are overexpressing MET.  I could certainly try single agent Keytruda.  I do not think he would do well with nivolumab/Yervoy combination.  His blood pressure is quite good today.  His blood sugars not all that bad.  He does have the indwelling Foley catheter.  He has had the [place]  He is not sure when he goes back for his next dose of Lutathera.  Overall, I would say his performance status is ECOG 1-2.     Medications:  Current Outpatient Medications:  .  ACCU-CHEK SMARTVIEW test strip, , Disp: , Rfl: 10 .  baclofen (LIORESAL) 10 mg/mL SUSP, , Disp: , Rfl:  .  baclofen (LIORESAL) 20 MG tablet, , Disp: , Rfl:  .  Balsam [place]-Castor Oil (VENELEX) OINT, Apply topically as needed, Disp: 60 g, Rfl: 4 .  capecitabine (XELODA) 500 MG tablet, Take by mouth., Disp: , Rfl:  .  chlorproMAZINE (THORAZINE) 10 MG tablet, Take by mouth., Disp: , Rfl:  .  diazepam (VALIUM) 5 MG tablet, TAKE 1 TABLET BY MOUTH 3 TIMES DAILY FOR SPASMS/ANXIETY, Disp: 90 tablet, Rfl: 1 .  diphenoxylate-atropine (LOMOTIL) 2.5-0.025 MG tablet, TAKE 1 TABLET BY MOUTH 4 TIMES DAILY AS NEEDED FOR DIARRHEA OR LOOSE STOOLS, Disp: 100 tablet, Rfl: 0 .  doxazosin (CARDURA) 1 MG tablet, Take 1 tablet (1 mg total) by mouth 2 (two) times daily. (Patient taking differently: Take 1 mg by mouth 2 (two) times daily as needed (blood pressure spikes.). ), Disp: 60 tablet, Rfl: 2 .  dronabinol (MARINOL) 5 MG capsule, TAKE 1 CAPSULE BY MOUTH TWICE DAILY WITH MEALS, Disp: 60 capsule, Rfl: 3 .  gabapentin (NEURONTIN) 300 MG capsule, TAKE 2 CAPSULES BY MOUTH 3 TIMES DAILY., Disp: 180 capsule, Rfl: 6 .  HYDROcodone-acetaminophen (NORCO/VICODIN) 5-325 MG tablet, Take 1-2 tablets by mouth every 6 (six) hours as needed for moderate pain., Disp: 90 tablet, Rfl: 0 .  HYDROmorphone (DILAUDID) 4 MG tablet, Take 1 tablet (4 mg total) by mouth every 6 (six) hours as needed for severe pain., Disp: 40 tablet, Rfl: 0 .  insulin glargine (LANTUS) 100 UNIT/ML injection, Inject 31 Units into the skin daily before breakfast. , Disp: , Rfl:  .  insulin lispro (HUMALOG) 100 UNIT/ML injection, Inject 3-9 Units into the skin 3 (three) times daily before meals. SS, Disp: , Rfl:  .  lipase/protease/amylase (CREON) 12000 units CPEP capsule, Take by mouth., Disp: , Rfl:  .  midodrine (PROAMATINE) 5 MG tablet, TKAE 1/2 TABLET BY MOUTH TWICE A DAY AS NEEDED, Disp: 60 tablet, Rfl:  6 .  NON FORMULARY, Take by mouth., Disp: , Rfl:  .  oxybutynin (DITROPAN) 5 MG tablet, 5 mg 2 (two) times daily. , Disp: , Rfl: 3 .  promethazine (PHENERGAN) 12.5 MG tablet, Take 12.5 mg by mouth every 6 (six) hours as needed for nausea. , Disp: , Rfl:  .  tiZANidine (ZANAFLEX) 4 MG tablet, TAKE 2 TABLETS BY MOUTH EVERY 6 HOURS AS NEEDED FOR PAIN, Disp: 30 tablet, Rfl: 3 .  traMADol (ULTRAM) 50 MG tablet, TAKE 1 TABLET BY MOUTH 3 TIMES DAILY, Disp: 90 tablet, Rfl: 2  Allergies:  Allergies  Allergen Reactions  . Citalopram Diarrhea  . Ciprocin-Fluocin-Procin [Fluocinolone Acetonide] Rash    Pt states this happened with IV Cipro. ABLE TO TAKE PO CIPRO.  . Other Rash    IV-CIPRO    Past Medical History, Surgical history, Social history, and Family History were reviewed and updated.  Review of Systems: Review of Systems  Constitutional: Negative.   HENT: Negative.   Eyes: Negative.   Respiratory: Negative.   Cardiovascular: Negative.   Gastrointestinal: Positive for diarrhea and nausea.  Genitourinary: Positive for frequency.  Musculoskeletal: Positive for myalgias and neck pain.  Skin: Negative.   Neurological: Negative.   Endo/Heme/Allergies: Negative.   Psychiatric/Behavioral: Negative.      Physical Exam:  oral temperature is 97.5 F (36.4 C) (abnormal). His blood pressure is 82/54 (abnormal) and his pulse is 94. His respiration is 18 and oxygen saturation is 99%.   Physical Exam Vitals signs reviewed.  HENT:     Head: Normocephalic and atraumatic.  Eyes:     Pupils: Pupils are equal, round, and reactive to light.  Neck:     Musculoskeletal: Normal range of motion.  Cardiovascular:     Rate and Rhythm: Normal rate and regular rhythm.     Heart sounds: Normal heart sounds.  Pulmonary:     Effort: Pulmonary effort is normal.     Breath sounds: Normal breath sounds.  Abdominal:     General: Bowel sounds are normal.     Palpations: Abdomen is soft.     Comments:  He has a suprapubic catheter in place  Musculoskeletal: Normal range of motion.        General: No tenderness or deformity.     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  Skin:    General: Skin is warm and dry.     Findings: No erythema or rash.  Neurological:     Mental Status: He is alert and oriented to person, place, and time.  Psychiatric:        Behavior: Behavior normal.        Thought Content: Thought content normal.        Judgment: Judgment normal.   .  Lab  Results  Component Value Date   WBC 11.3 (H) 01/09/2018   HGB 13.8 12/05/2017   HCT 43.8 01/09/2018   MCV 86.4 01/09/2018   PLT 288 01/09/2018     Chemistry      Component Value Date/Time   NA 137 01/09/2018 1118   NA 137 12/05/2017 0840   NA 138 10/12/2016 0955   K 3.3 01/09/2018 1118   K 3.4 12/05/2017 0840   K 3.3 (L) 10/12/2016 0955   CL 104 01/09/2018 1118   CL 101 12/05/2017 0840   CO2 30 01/09/2018 1118   CO2 29 12/05/2017 0840   CO2 25 10/12/2016 0955   BUN 14 01/09/2018 1118   BUN 16 12/05/2017 0840   BUN 22.0 10/12/2016 0955   CREATININE 0.8 12/05/2017 0840   CREATININE 0.8 10/12/2016 0955      Component Value Date/Time   CALCIUM 8.8 01/09/2018 1118   CALCIUM 8.6 12/05/2017 0840   CALCIUM 9.0 10/12/2016 0955   ALKPHOS 101 (H) 01/09/2018 1118   ALKPHOS 139 (H) 12/05/2017 0840   ALKPHOS 159 (H) 10/12/2016 0955   AST 20 01/09/2018 1118   AST 45 (H) 10/12/2016 0955   ALT 12 01/09/2018 1118   ALT 19 12/05/2017 0840   ALT 29 10/12/2016 0955   BILITOT 0.8 01/09/2018 1118   BILITOT 0.62 10/12/2016 0955     Impression and Plan: [name] is 58 year old gentleman with von Hippel-Lindau syndrome. He has multiple neuroendocrine tumors. 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 He's had multiple spinal surgeries.  Again, we will have to see what his CAT scan shows.  Hopefully, we will see a response.   If we see a response or even stable disease, then maybe I will try single agent Keytruda on him.  Again, given that he overexpresses MET, we could certainly try the oral agent for this.  I spent about 35 minutes with him.  I just wish his wife could be with him.  She really does help out quite a bit.  I will plan to get him back in 3 weeks.  I want to do a CT scan next week.  With [name], is it is always complicated since he has the von Hippel-Lindau syndrome and the multiple malignancies.  Also, he has iron deficiency anemia and diabetes that we have to monitor.    Volanda Napoleon, MD 2/4/20196:20 PM

## 2019-09-20 LAB — IRON AND TIBC
Iron: 53 ug/dL (ref 42–163)
Saturation Ratios: 25 % (ref 20–55)
TIBC: 214 ug/dL (ref 202–409)
UIBC: 161 ug/dL (ref 117–376)

## 2019-09-20 LAB — LACTATE DEHYDROGENASE: LDH: 147 U/L (ref 98–192)

## 2019-09-20 LAB — FERRITIN: Ferritin: 438 ng/mL — ABNORMAL HIGH (ref 24–336)

## 2019-09-24 ENCOUNTER — Other Ambulatory Visit: Payer: Self-pay | Admitting: Family

## 2019-09-24 ENCOUNTER — Other Ambulatory Visit: Payer: Self-pay | Admitting: Hematology & Oncology

## 2019-09-24 DIAGNOSIS — N319 Neuromuscular dysfunction of bladder, unspecified: Secondary | ICD-10-CM

## 2019-09-24 DIAGNOSIS — Q858 Other phakomatoses, not elsewhere classified: Secondary | ICD-10-CM

## 2019-09-24 DIAGNOSIS — Q8583 Von Hippel-Lindau syndrome: Secondary | ICD-10-CM

## 2019-09-24 DIAGNOSIS — C7A8 Other malignant neuroendocrine tumors: Secondary | ICD-10-CM

## 2019-09-24 DIAGNOSIS — D500 Iron deficiency anemia secondary to blood loss (chronic): Secondary | ICD-10-CM

## 2019-09-24 MED FILL — BACLOFEN 20 MG TABLET: 20 | 15 days supply | Qty: 120 | Fill #3

## 2019-09-24 MED FILL — traMADol HCL 50 MG TABS: 50 | 30 days supply | Qty: 90 | Fill #0

## 2019-09-24 MED FILL — SOLIFENACIN SUCCINATE 5 MG: 5 | 30 days supply | Qty: 30 | Fill #0

## 2019-09-24 MED FILL — LANTUS 100 UNITS/ML VIAL: 100 | 28 days supply | Qty: 10 | Fill #0

## 2019-09-27 ENCOUNTER — Other Ambulatory Visit: Payer: Self-pay

## 2019-09-27 ENCOUNTER — Encounter (HOSPITAL_BASED_OUTPATIENT_CLINIC_OR_DEPARTMENT_OTHER): Payer: Self-pay

## 2019-09-27 ENCOUNTER — Ambulatory Visit (HOSPITAL_BASED_OUTPATIENT_CLINIC_OR_DEPARTMENT_OTHER)
Admission: RE | Admit: 2019-09-27 | Discharge: 2019-09-27 | Disposition: A | Discharge status: Home / Self Care | Payer: 59 | Source: Ambulatory Visit | Attending: Hematology & Oncology | Admitting: Hematology & Oncology

## 2019-09-27 DIAGNOSIS — C642 Malignant neoplasm of left kidney, except renal pelvis: Secondary | ICD-10-CM

## 2019-09-27 DIAGNOSIS — C7B8 Other secondary neuroendocrine tumors: Secondary | ICD-10-CM | POA: Diagnosis not present

## 2019-09-27 DIAGNOSIS — K7689 Other specified diseases of liver: Secondary | ICD-10-CM | POA: Diagnosis not present

## 2019-09-27 DIAGNOSIS — N281 Cyst of kidney, acquired: Secondary | ICD-10-CM | POA: Diagnosis not present

## 2019-09-27 MED ORDER — IOHEXOL 300 MG/ML  SOLN
100.0000 mL | Freq: Once | INTRAMUSCULAR | Status: AC | PRN
  Administered 2019-09-27: 100 mL via INTRAVENOUS

## 2019-09-27 MED FILL — OXYBUTYNIN CHLORIDE 5 MG TA: 5 | 30 days supply | Qty: 60 | Fill #2

## 2019-10-01 MED FILL — GABAPENTIN 300 MG CAPSULE: 300 | 20 days supply | Qty: 120 | Fill #3

## 2019-10-02 ENCOUNTER — Ambulatory Visit (HOSPITAL_COMMUNITY): Payer: 59

## 2019-10-04 ENCOUNTER — Telehealth: Payer: Self-pay | Admitting: *Deleted

## 2019-10-04 NOTE — Telephone Encounter (Signed)
Call placed to patient's wife, Ellard Artis to inform her of appt times for radiation on Tuesday, 10/09/19 at 11:30AM.  Pt.'s wife appreciative of call and assistance from radiation department.

## 2019-10-04 NOTE — Telephone Encounter (Signed)
-----   Message from Volanda Napoleon, MD sent at 10/03/2019  4:53 PM EDT ----- Call - the renal tumors are NOT larger!!!  Maybe you get a little benefit from the immunotherapy.  Laurey Arrow

## 2019-10-04 NOTE — Telephone Encounter (Addendum)
Call placed to patient's wife, Ellard Artis to inform her per order of Dr. Marin Olp that the "renal tumors are NOT larger!!!  Maybe you got a little benefit from the immunotherapy."  [facility] appreciative of call and would like to know what will be done about the metastatic lesion on patient's femur.  Wendi notified per order of Dr. Marin Olp that Dr. Marin Olp is going to contact Dr. Sondra Come regarding possible radiation for metastatic lesion.  [facility] appreciative of assistance and has no further questions at this time.

## 2019-10-05 MED FILL — DRONABINOL 5 MG CAP: 5 | 15 days supply | Qty: 30 | Fill #3

## 2019-10-08 ENCOUNTER — Telehealth: Payer: Self-pay | Admitting: *Deleted

## 2019-10-08 NOTE — Telephone Encounter (Signed)
CALLED PATIENT  TO INFORM THAT THIS PATIENT'S CONSULT FOR TOMORROW - 10-09-19 HAS BEEN CANCELLED DUE TO HIM NEEDING A BIOPSY , LVM FOR A RETURN CALL

## 2019-10-09 ENCOUNTER — Ambulatory Visit: Payer: 59

## 2019-10-09 ENCOUNTER — Ambulatory Visit: Payer: 59 | Admitting: Radiation Oncology

## 2019-10-10 ENCOUNTER — Telehealth: Payer: Self-pay | Admitting: Hematology & Oncology

## 2019-10-10 ENCOUNTER — Ambulatory Visit: Payer: 59

## 2019-10-10 ENCOUNTER — Other Ambulatory Visit: Payer: Self-pay

## 2019-10-10 ENCOUNTER — Inpatient Hospital Stay: Payer: 59

## 2019-10-10 ENCOUNTER — Inpatient Hospital Stay: Payer: 59 | Attending: Hematology & Oncology

## 2019-10-10 ENCOUNTER — Inpatient Hospital Stay (HOSPITAL_BASED_OUTPATIENT_CLINIC_OR_DEPARTMENT_OTHER): Payer: 59 | Admitting: Hematology & Oncology

## 2019-10-10 ENCOUNTER — Encounter: Payer: Self-pay | Admitting: Hematology & Oncology

## 2019-10-10 VITALS — BP 117/84 | HR 76 | Temp 97.9°F | Resp 20

## 2019-10-10 DIAGNOSIS — Z79899 Other long term (current) drug therapy: Secondary | ICD-10-CM | POA: Diagnosis not present

## 2019-10-10 DIAGNOSIS — N390 Urinary tract infection, site not specified: Secondary | ICD-10-CM

## 2019-10-10 DIAGNOSIS — D500 Iron deficiency anemia secondary to blood loss (chronic): Secondary | ICD-10-CM | POA: Diagnosis not present

## 2019-10-10 DIAGNOSIS — C649 Malignant neoplasm of unspecified kidney, except renal pelvis: Secondary | ICD-10-CM | POA: Diagnosis not present

## 2019-10-10 DIAGNOSIS — C642 Malignant neoplasm of left kidney, except renal pelvis: Secondary | ICD-10-CM

## 2019-10-10 DIAGNOSIS — K922 Gastrointestinal hemorrhage, unspecified: Secondary | ICD-10-CM | POA: Diagnosis not present

## 2019-10-10 DIAGNOSIS — C7A8 Other malignant neuroendocrine tumors: Secondary | ICD-10-CM

## 2019-10-10 DIAGNOSIS — T83510S Infection and inflammatory reaction due to cystostomy catheter, sequela: Secondary | ICD-10-CM

## 2019-10-10 DIAGNOSIS — Q858 Other phakomatoses, not elsewhere classified: Secondary | ICD-10-CM | POA: Insufficient documentation

## 2019-10-10 LAB — CMP (CANCER CENTER ONLY)
ALT: 9 U/L (ref 0–44)
AST: 13 U/L — ABNORMAL LOW (ref 15–41)
Albumin: 3.2 g/dL — ABNORMAL LOW (ref 3.5–5.0)
Alkaline Phosphatase: 96 U/L (ref 38–126)
Anion gap: 9 (ref 5–15)
BUN: 25 mg/dL — ABNORMAL HIGH (ref 6–20)
CO2: 25 mmol/L (ref 22–32)
Calcium: 8.3 mg/dL — ABNORMAL LOW (ref 8.9–10.3)
Chloride: 98 mmol/L (ref 98–111)
Creatinine: 0.88 mg/dL (ref 0.61–1.24)
GFR, Est AFR Am: >60 mL/min (ref >60)
GFR, Estimated: >60 mL/min (ref >60)
Glucose, Bld: 327 mg/dL — ABNORMAL HIGH (ref 70–99)
Potassium: 3.7 mmol/L (ref 3.5–5.1)
Sodium: 132 mmol/L — ABNORMAL LOW (ref 135–145)
Total Bilirubin: 0.5 mg/dL (ref 0.3–1.2)
Total Protein: 6.4 g/dL — ABNORMAL LOW (ref 6.5–8.1)

## 2019-10-10 LAB — CBC WITH DIFFERENTIAL (CANCER CENTER ONLY)
Abs Immature Granulocytes: 0.03 10*3/uL (ref 0.00–0.07)
Basophils Absolute: 0 10*3/uL (ref 0.0–0.1)
Basophils Relative: 0 %
Eosinophils Absolute: 0 10*3/uL (ref 0.0–0.5)
Eosinophils Relative: 0 %
HCT: 34 % — ABNORMAL LOW (ref 39.0–52.0)
Hemoglobin: 11.1 g/dL — ABNORMAL LOW (ref 13.0–17.0)
Immature Granulocytes: 0 %
Lymphocytes Relative: 6 %
Lymphs Abs: 0.4 10*3/uL — ABNORMAL LOW (ref 0.7–4.0)
MCH: 30.8 pg (ref 26.0–34.0)
MCHC: 32.6 g/dL (ref 30.0–36.0)
MCV: 94.4 fL (ref 80.0–100.0)
Monocytes Absolute: 0.7 10*3/uL (ref 0.1–1.0)
Monocytes Relative: 9 %
Neutro Abs: 5.7 10*3/uL (ref 1.7–7.7)
Neutrophils Relative %: 85 %
Platelet Count: 154 10*3/uL (ref 150–400)
RBC: 3.6 MIL/uL — ABNORMAL LOW (ref 4.22–5.81)
RDW: 14.3 % (ref 11.5–15.5)
WBC Count: 6.9 10*3/uL (ref 4.0–10.5)
nRBC: 0 % (ref 0.0–0.2)

## 2019-10-10 LAB — LACTATE DEHYDROGENASE: LDH: 119 U/L (ref 98–192)

## 2019-10-10 MED ORDER — FENTANYL 12 MCG/HR TD PT72: MEDICATED_PATCH | TRANSDERMAL | 0 refills | Status: DC

## 2019-10-10 MED ORDER — HYDROMORPHONE HCL 4 MG PO TABS: 4.0000 mg | ORAL_TABLET | Freq: Four times a day (QID) | ORAL | 0 refills | Status: DC | PRN

## 2019-10-10 MED ORDER — LANREOTIDE ACETATE 120 MG/0.5ML ~~LOC~~ SOLN
SUBCUTANEOUS | Status: AC
  Filled 2019-10-10: qty 120

## 2019-10-10 MED FILL — HYDROmorphone HCL 4 MG TABS: 4 | 15 days supply | Qty: 60 | Fill #0

## 2019-10-10 MED FILL — FENTANYL 12 MCG/HR PT72: 12 | 30 days supply | Qty: 10 | Fill #0

## 2019-10-10 NOTE — Progress Notes (Signed)
Hematology and Oncology Follow Up Visit  [name] [id number] 1960-12-15 58 y.o. 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  Current Therapy:  Temodar/Xeloda - start 03/17/2016 - s/p c#3 - discontinued Somatuline 180m SQ q month Lutathera -- cycle #1/4 on 06/20/2019 IV iron as indicated-Feraheme given on 11/19/2018 Inlyta/Keytruda -- start on 08/08/2019 -- d/c on 08/26/2019 due to toxicity     Interim History:  Mr.  CFoutsis back for followup.  He is looking much better.  He feels better.  Again, I think he really had a tough time with the Inlyta.  I do not think the pembrolizumab was much of a problem for him.  We did do his repeat scans.  They were done on 09/27/2019.  Everything looked relatively stable with respect to his renal cell cancer.  He does have the lesions in the left kidney measuring 6.8 x 4.8 cm.  Again this is holding steady.  What was potentially new is the fact that they were calling a lesion the right proximal femur measuring 4.7 x 2.7 cm.  In talking to the radiologist, he looked at past scans.  Looks like this was possibly present at that time.  However, we are going to have to get a biopsy on this lesion.  We really need to see if this is metastatic renal cell carcinoma.  I would think renal cell would be much more likely then neuroendocrine.  He is eating better.  His blood sugars are on the higher side.  This actually is a good thing for him.  He has not had problems with increased pain.  He does have the indwelling suprapubic catheter for his urine.  He is not having bad diarrhea.  His stools are loose.  I think his next Lutathera treatment is going to be in a couple weeks.  Currently, I would say his performance status is ECOG 1.    Medications:  Current Outpatient Medications:  .  ACCU-CHEK  SMARTVIEW test strip, , Disp: , Rfl: 10 .  baclofen (LIORESAL) 10 mg/mL SUSP, , Disp: , Rfl:  .  baclofen (LIORESAL) 20 MG tablet, , Disp: , Rfl:  .  Balsam Peru-Castor Oil (VENELEX) OINT, Apply topically as needed, Disp: 60 g, Rfl: 4 .  capecitabine (XELODA) 500 MG tablet, Take by mouth., Disp: , Rfl:  .  chlorproMAZINE (THORAZINE) 10 MG tablet, Take by mouth., Disp: , Rfl:  .  diazepam (VALIUM) 5 MG tablet, TAKE 1 TABLET BY MOUTH 3 TIMES DAILY FOR SPASMS/ANXIETY, Disp: 90 tablet, Rfl: 1 .  diphenoxylate-atropine (LOMOTIL) 2.5-0.025 MG tablet, TAKE 1 TABLET BY MOUTH 4 TIMES DAILY AS NEEDED FOR DIARRHEA OR LOOSE STOOLS, Disp: 100 tablet, Rfl: 0 .  doxazosin (CARDURA) 1 MG tablet, Take 1 tablet (1 mg total) by mouth 2 (two) times daily. (Patient taking differently: Take 1 mg by mouth 2 (two) times daily as needed (blood pressure spikes.). ), Disp: 60 tablet, Rfl: 2 .  dronabinol (MARINOL) 5 MG capsule, TAKE 1 CAPSULE BY MOUTH TWICE DAILY WITH MEALS, Disp: 60 capsule, Rfl: 3 .  gabapentin (NEURONTIN) 300 MG capsule, TAKE 2 CAPSULES BY MOUTH 3 TIMES DAILY., Disp: 180 capsule, Rfl: 6 .  HYDROcodone-acetaminophen (NORCO/VICODIN) 5-325 MG tablet, Take 1-2 tablets by mouth every 6 (six) hours as needed for moderate pain., Disp: 90 tablet, Rfl: 0 .  HYDROmorphone (DILAUDID) 4 MG  tablet, Take 1 tablet (4 mg total) by mouth every 6 (six) hours as needed for severe pain., Disp: 40 tablet, Rfl: 0 .  insulin glargine (LANTUS) 100 UNIT/ML injection, Inject 31 Units into the skin daily before breakfast. , Disp: , Rfl:  .  insulin lispro (HUMALOG) 100 UNIT/ML injection, Inject 3-9 Units into the skin 3 (three) times daily before meals. SS, Disp: , Rfl:  .  lipase/protease/amylase (CREON) 12000 units CPEP capsule, Take by mouth., Disp: , Rfl:  .  midodrine (PROAMATINE) 5 MG tablet, TKAE 1/2 TABLET BY MOUTH TWICE A DAY AS NEEDED, Disp: 60 tablet, Rfl: 6 .  NON FORMULARY, Take by mouth., Disp: , Rfl:  .  oxybutynin  (DITROPAN) 5 MG tablet, 5 mg 2 (two) times daily. , Disp: , Rfl: 3 .  promethazine (PHENERGAN) 12.5 MG tablet, Take 12.5 mg by mouth every 6 (six) hours as needed for nausea. , Disp: , Rfl:  .  tiZANidine (ZANAFLEX) 4 MG tablet, TAKE 2 TABLETS BY MOUTH EVERY 6 HOURS AS NEEDED FOR PAIN, Disp: 30 tablet, Rfl: 3 .  traMADol (ULTRAM) 50 MG tablet, TAKE 1 TABLET BY MOUTH 3 TIMES DAILY, Disp: 90 tablet, Rfl: 2  Allergies:  Allergies  Allergen Reactions  . Citalopram Diarrhea  . Ciprocin-Fluocin-Procin [Fluocinolone Acetonide] Rash    Pt states this happened with IV Cipro. ABLE TO TAKE PO CIPRO.  . Other Rash    IV-CIPRO    Past Medical History, Surgical history, Social history, and Family History were reviewed and updated.  Review of Systems: Review of Systems  Constitutional: Negative.   HENT: Negative.   Eyes: Negative.   Respiratory: Negative.   Cardiovascular: Negative.   Gastrointestinal: Positive for diarrhea and nausea.  Genitourinary: Positive for frequency.  Musculoskeletal: Positive for myalgias and neck pain.  Skin: Negative.   Neurological: Negative.   Endo/Heme/Allergies: Negative.   Psychiatric/Behavioral: Negative.      Physical Exam:  oral temperature is 97.5 F (36.4 C) (abnormal). His blood pressure is 82/54 (abnormal) and his pulse is 94. His respiration is 18 and oxygen saturation is 99%.   Physical Exam Vitals signs reviewed.  HENT:     Head: Normocephalic and atraumatic.  Eyes:     Pupils: Pupils are equal, round, and reactive to light.  Neck:     Musculoskeletal: Normal range of motion.  Cardiovascular:     Rate and Rhythm: Normal rate and regular rhythm.     Heart sounds: Normal heart sounds.  Pulmonary:     Effort: Pulmonary effort is normal.     Breath sounds: Normal breath sounds.  Abdominal:     General: Bowel sounds are normal.     Palpations: Abdomen is soft.     Comments: He has a suprapubic catheter in place  Musculoskeletal: Normal  range of motion.        General: No tenderness or deformity.     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  Skin:    General: Skin is warm and dry.     Findings: No erythema or rash.  Neurological:     Mental Status: He is alert and oriented to person, place, and time.  Psychiatric:        Behavior: Behavior normal.        Thought Content: Thought content normal.        Judgment: Judgment normal.   .  Lab Results  Component Value  Date   WBC 11.3 (H) 01/09/2018   HGB 13.8 12/05/2017   HCT 43.8 01/09/2018   MCV 86.4 01/09/2018   PLT 288 01/09/2018     Chemistry      Component Value Date/Time   NA 137 01/09/2018 1118   NA 137 12/05/2017 0840   NA 138 10/12/2016 0955   K 3.3 01/09/2018 1118   K 3.4 12/05/2017 0840   K 3.3 (L) 10/12/2016 0955   CL 104 01/09/2018 1118   CL 101 12/05/2017 0840   CO2 30 01/09/2018 1118   CO2 29 12/05/2017 0840   CO2 25 10/12/2016 0955   BUN 14 01/09/2018 1118   BUN 16 12/05/2017 0840   BUN 22.0 10/12/2016 0955   CREATININE 0.8 12/05/2017 0840   CREATININE 0.8 10/12/2016 0955      Component Value Date/Time   CALCIUM 8.8 01/09/2018 1118   CALCIUM 8.6 12/05/2017 0840   CALCIUM 9.0 10/12/2016 0955   ALKPHOS 101 (H) 01/09/2018 1118   ALKPHOS 139 (H) 12/05/2017 0840   ALKPHOS 159 (H) 10/12/2016 0955   AST 20 01/09/2018 1118   AST 45 (H) 10/12/2016 0955   ALT 12 01/09/2018 1118   ALT 19 12/05/2017 0840   ALT 29 10/12/2016 0955   BILITOT 0.8 01/09/2018 1118   BILITOT 0.62 10/12/2016 0955     Impression and Plan: Mr. Terrones is 58 year old gentleman with von Hippel-Lindau syndrome. He has multiple neuroendocrine tumors. 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 He's had multiple spinal surgeries.  For right now, we will have to see about the biopsy.  This will certainly be helpful.  He will be set up with the [facility] in a couple weeks.  I  would like to see him back in a little over 4 or 5 weeks.  By then, we should have the biopsy results back.  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  This might be helpful for [place] if we need to try oral systemic therapy on him.   Volanda Napoleon, MD 2/4/20196:20 PM

## 2019-10-10 NOTE — Telephone Encounter (Signed)
Appointments scheduled calendar printed per 11/4 los

## 2019-10-11 ENCOUNTER — Encounter (HOSPITAL_COMMUNITY): Payer: Self-pay

## 2019-10-11 ENCOUNTER — Telehealth: Payer: Self-pay | Admitting: Hematology & Oncology

## 2019-10-11 LAB — IRON AND TIBC
Iron: 14 ug/dL — ABNORMAL LOW (ref 42–163)
Saturation Ratios: 7 % — ABNORMAL LOW (ref 20–55)
TIBC: 188 ug/dL — ABNORMAL LOW (ref 202–409)
UIBC: 174 ug/dL (ref 117–376)

## 2019-10-11 LAB — FERRITIN: Ferritin: 464 ng/mL — ABNORMAL HIGH (ref 24–336)

## 2019-10-11 LAB — TSH: TSH: 2.34 u[IU]/mL (ref 0.320–4.118)

## 2019-10-11 NOTE — Progress Notes (Signed)
2  Scott A. Dechert Jr. Male, 58 y.o., 12-09-1960 MRN:  [id number] Phone:  [phone] [name]) PCP:  Scott Napoleon, MD Primary Cvg:  [name] Next Appt With Radiology [name] LUTATHERA RM) 10/16/2019 at 7:00 AM  RE: Biopdy Received: Today Message Contents  Scott Cleveland, MD  [name]  Ok   CT core R thigh lesion  ST mass lateral to femur, see CT 09/27/19 Im 137 Se 2   DD[facility]   Previous Messages  ----- Message -----  From: [name]  Sent: 10/10/2019  4:47 PM EST  To: [name]  Subject: Biopdy                      Procedure Requested: Ct Biopsy    Reason for Procedure: right proximal femur lesion. ?? met kidney cancer    Provider Requesting: Scott Napoleon, MD  Provider Telephone: [phone]    Other Info: Rad Exam in Epic, Patient is a Paraplegic

## 2019-10-11 NOTE — Telephone Encounter (Signed)
Called and spoke with wife regarding IRON infusion appointment. Appointment scheduled for 11/9 @ 2:00

## 2019-10-12 ENCOUNTER — Ambulatory Visit: Payer: 59

## 2019-10-12 ENCOUNTER — Other Ambulatory Visit: Payer: Self-pay | Admitting: Hematology & Oncology

## 2019-10-12 MED FILL — CREON DR 24,000 UNITS CAP: 24000-76000 | 30 days supply | Qty: 270 | Fill #0

## 2019-10-12 MED FILL — tiZANidine HCL 4 MG TABS: 4 | 4 days supply | Qty: 30 | Fill #1

## 2019-10-12 MED FILL — diazePAM 5 MG TABS: 5 | 30 days supply | Qty: 90 | Fill #0

## 2019-10-15 ENCOUNTER — Other Ambulatory Visit: Payer: Self-pay

## 2019-10-15 ENCOUNTER — Inpatient Hospital Stay: Payer: 59

## 2019-10-15 VITALS — BP 160/94 | HR 57 | Temp 97.7°F | Resp 18

## 2019-10-15 DIAGNOSIS — D500 Iron deficiency anemia secondary to blood loss (chronic): Secondary | ICD-10-CM

## 2019-10-15 DIAGNOSIS — C649 Malignant neoplasm of unspecified kidney, except renal pelvis: Secondary | ICD-10-CM | POA: Diagnosis not present

## 2019-10-15 DIAGNOSIS — Q858 Other phakomatoses, not elsewhere classified: Secondary | ICD-10-CM | POA: Diagnosis not present

## 2019-10-15 DIAGNOSIS — C7A8 Other malignant neuroendocrine tumors: Secondary | ICD-10-CM | POA: Diagnosis not present

## 2019-10-15 DIAGNOSIS — Z79899 Other long term (current) drug therapy: Secondary | ICD-10-CM | POA: Diagnosis not present

## 2019-10-15 DIAGNOSIS — K922 Gastrointestinal hemorrhage, unspecified: Secondary | ICD-10-CM | POA: Diagnosis not present

## 2019-10-15 MED ORDER — SODIUM CHLORIDE 0.9 % IV SOLN
INTRAVENOUS | Status: DC
  Administered 2019-10-15: 15:00:00 via INTRAVENOUS
  Filled 2019-10-15: qty 250

## 2019-10-15 MED ORDER — SODIUM CHLORIDE 0.9 % IV SOLN
510.0000 mg | Freq: Once | INTRAVENOUS | Status: AC
  Administered 2019-10-15: 510 mg via INTRAVENOUS
  Filled 2019-10-15: qty 510

## 2019-10-15 NOTE — Patient Instructions (Signed)

## 2019-10-16 ENCOUNTER — Ambulatory Visit (HOSPITAL_COMMUNITY)
Admission: RE | Admit: 2019-10-16 | Discharge: 2019-10-16 | Disposition: A | Discharge status: Home / Self Care | Payer: 59 | Source: Ambulatory Visit | Attending: Hematology & Oncology | Admitting: Hematology & Oncology

## 2019-10-16 DIAGNOSIS — C7A8 Other malignant neuroendocrine tumors: Secondary | ICD-10-CM | POA: Diagnosis not present

## 2019-10-16 DIAGNOSIS — C7B8 Other secondary neuroendocrine tumors: Secondary | ICD-10-CM | POA: Diagnosis not present

## 2019-10-16 LAB — CBC WITH DIFFERENTIAL/PLATELET
Abs Immature Granulocytes: 0.04 10*3/uL (ref 0.00–0.07)
Basophils Absolute: 0 10*3/uL (ref 0.0–0.1)
Basophils Relative: 0 %
Eosinophils Absolute: 0.1 10*3/uL (ref 0.0–0.5)
Eosinophils Relative: 2 %
HCT: 34.2 % — ABNORMAL LOW (ref 39.0–52.0)
Hemoglobin: 10.9 g/dL — ABNORMAL LOW (ref 13.0–17.0)
Immature Granulocytes: 1 %
Lymphocytes Relative: 11 %
Lymphs Abs: 0.6 10*3/uL — ABNORMAL LOW (ref 0.7–4.0)
MCH: 30.4 pg (ref 26.0–34.0)
MCHC: 31.9 g/dL (ref 30.0–36.0)
MCV: 95.5 fL (ref 80.0–100.0)
Monocytes Absolute: 0.5 10*3/uL (ref 0.1–1.0)
Monocytes Relative: 9 %
Neutro Abs: 4 10*3/uL (ref 1.7–7.7)
Neutrophils Relative %: 77 %
Platelets: 163 10*3/uL (ref 150–400)
RBC: 3.58 MIL/uL — ABNORMAL LOW (ref 4.22–5.81)
RDW: 14.4 % (ref 11.5–15.5)
WBC: 5.2 10*3/uL (ref 4.0–10.5)
nRBC: 0 % (ref 0.0–0.2)

## 2019-10-16 LAB — COMPREHENSIVE METABOLIC PANEL
ALT: 14 U/L (ref 0–44)
AST: 17 U/L (ref 15–41)
Albumin: 3 g/dL — ABNORMAL LOW (ref 3.5–5.0)
Alkaline Phosphatase: 105 U/L (ref 38–126)
Anion gap: 6 (ref 5–15)
BUN: 20 mg/dL (ref 6–20)
CO2: 24 mmol/L (ref 22–32)
Calcium: 8.2 mg/dL — ABNORMAL LOW (ref 8.9–10.3)
Chloride: 107 mmol/L (ref 98–111)
Creatinine, Ser: 0.69 mg/dL (ref 0.61–1.24)
GFR calc Af Amer: >60 mL/min (ref >60)
GFR calc non Af Amer: >60 mL/min (ref >60)
Glucose, Bld: 125 mg/dL — ABNORMAL HIGH (ref 70–99)
Potassium: 3.6 mmol/L (ref 3.5–5.1)
Sodium: 137 mmol/L (ref 135–145)
Total Bilirubin: 1.2 mg/dL (ref 0.3–1.2)
Total Protein: 6.7 g/dL (ref 6.5–8.1)

## 2019-10-16 LAB — GLUCOSE, CAPILLARY: Glucose-Capillary: 118 mg/dL — ABNORMAL HIGH (ref 70–99)

## 2019-10-16 MED ORDER — PROCHLORPERAZINE EDISYLATE 10 MG/2ML IJ SOLN: 10.0000 mg | Freq: Four times a day (QID) | INTRAMUSCULAR | Status: DC | PRN

## 2019-10-16 MED ORDER — OCTREOTIDE ACETATE 500 MCG/ML IJ SOLN
INTRAMUSCULAR | Status: AC
  Filled 2019-10-16: qty 1

## 2019-10-16 MED ORDER — SODIUM CHLORIDE 0.9 % IV SOLN
8.0000 mg | Freq: Once | INTRAVENOUS | Status: AC
  Administered 2019-10-16: 8 mg via INTRAVENOUS
  Filled 2019-10-16: qty 4

## 2019-10-16 MED ORDER — PROCHLORPERAZINE EDISYLATE 10 MG/2ML IJ SOLN
INTRAMUSCULAR | Status: AC
  Filled 2019-10-16: qty 2

## 2019-10-16 MED ORDER — ONDANSETRON HCL 8 MG PO TABS: 8.0000 mg | ORAL_TABLET | Freq: Two times a day (BID) | ORAL | 0 refills | Status: DC | PRN

## 2019-10-16 MED ORDER — LUTETIUM LU 177 DOTATATE 370 MBQ/ML IV SOLN
200.0000 | Freq: Once | INTRAVENOUS | Status: AC
  Administered 2019-10-16: 200 via INTRAVENOUS

## 2019-10-16 MED ORDER — OCTREOTIDE ACETATE 30 MG IM KIT
PACK | INTRAMUSCULAR | Status: AC
  Administered 2019-10-16: 30 mg via INTRAMUSCULAR
  Filled 2019-10-16: qty 1

## 2019-10-16 MED ORDER — SODIUM CHLORIDE 0.9 % IV SOLN: 500.0000 mL | Freq: Once | INTRAVENOUS | Status: DC

## 2019-10-16 MED ORDER — AMINO ACID RADIOPROTECTANT - L-LYSINE 2.5%/L-ARGININE 2.5% IN NS
250.0000 mL/h | INTRAVENOUS | Status: AC
  Administered 2019-10-16: 250 mL/h via INTRAVENOUS
  Filled 2019-10-16: qty 1000

## 2019-10-16 MED ORDER — OCTREOTIDE ACETATE 30 MG IM KIT
30.0000 mg | PACK | Freq: Once | INTRAMUSCULAR | Status: AC
  Administered 2019-10-16: 15:00:00 30 mg via INTRAMUSCULAR

## 2019-10-16 MED ORDER — OCTREOTIDE ACETATE 500 MCG/ML IJ SOLN: 500.0000 ug | Freq: Once | INTRAMUSCULAR | Status: DC | PRN

## 2019-10-16 MED FILL — ONDANSETRON HCL 8 MG TABLET: 8 | 10 days supply | Qty: 20 | Fill #0

## 2019-10-16 MED FILL — HUMALOG 100 UNITS/ML KWIKPE: 100 | 63 days supply | Qty: 15 | Fill #0

## 2019-10-16 NOTE — Progress Notes (Signed)
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 The patient's most recent blood counts were reviewed and remains a good candidate to proceed with Lutathera. The patient was situated in an infusion suite and administered Lutathera as above. Patient will follow-up with referring oncologist for interval serum laboratories (CBC and CMP) in approximately 4 weeks.    Patient received 30 mg IM long-acting Sandostatin injection 4 hours after Lutathera effusion in the nuclear medicine department.   IMPRESSION: [Third] [id number] treatment for metastatic neuroendocrine tumor. The patient tolerated the infusion well. The patient will return in 8 weeks for ongoing care.

## 2019-10-19 ENCOUNTER — Other Ambulatory Visit: Payer: Self-pay | Admitting: *Deleted

## 2019-10-19 MED ORDER — DRONABINOL 5 MG PO CAPS: ORAL_CAPSULE | ORAL | 3 refills | Status: AC

## 2019-10-19 MED FILL — DRONABINOL 5 MG CAP: 5 | 30 days supply | Qty: 60 | Fill #0

## 2019-10-22 MED FILL — GABAPENTIN 300 MG CAPSULE: 300 | 20 days supply | Qty: 120 | Fill #4

## 2019-10-23 ENCOUNTER — Other Ambulatory Visit: Payer: Self-pay | Admitting: Family

## 2019-10-25 ENCOUNTER — Other Ambulatory Visit: Payer: Self-pay | Admitting: Physician Assistant

## 2019-10-26 ENCOUNTER — Ambulatory Visit (HOSPITAL_COMMUNITY)
Admission: RE | Admit: 2019-10-26 | Discharge: 2019-10-26 | Disposition: A | Discharge status: Home / Self Care | Payer: 59 | Source: Ambulatory Visit | Attending: Hematology & Oncology | Admitting: Hematology & Oncology

## 2019-10-26 ENCOUNTER — Other Ambulatory Visit: Payer: Self-pay

## 2019-10-26 ENCOUNTER — Encounter (HOSPITAL_COMMUNITY): Payer: Self-pay

## 2019-10-26 DIAGNOSIS — D509 Iron deficiency anemia, unspecified: Secondary | ICD-10-CM | POA: Diagnosis not present

## 2019-10-26 DIAGNOSIS — G839 Paralytic syndrome, unspecified: Secondary | ICD-10-CM | POA: Insufficient documentation

## 2019-10-26 DIAGNOSIS — Z8589 Personal history of malignant neoplasm of other organs and systems: Secondary | ICD-10-CM | POA: Insufficient documentation

## 2019-10-26 DIAGNOSIS — Z79899 Other long term (current) drug therapy: Secondary | ICD-10-CM | POA: Insufficient documentation

## 2019-10-26 DIAGNOSIS — M799 Soft tissue disorder, unspecified: Secondary | ICD-10-CM | POA: Diagnosis not present

## 2019-10-26 DIAGNOSIS — C642 Malignant neoplasm of left kidney, except renal pelvis: Secondary | ICD-10-CM | POA: Diagnosis not present

## 2019-10-26 DIAGNOSIS — Z794 Long term (current) use of insulin: Secondary | ICD-10-CM | POA: Diagnosis not present

## 2019-10-26 DIAGNOSIS — Z888 Allergy status to other drugs, medicaments and biological substances status: Secondary | ICD-10-CM | POA: Diagnosis not present

## 2019-10-26 DIAGNOSIS — E139 Other specified diabetes mellitus without complications: Secondary | ICD-10-CM | POA: Insufficient documentation

## 2019-10-26 DIAGNOSIS — M7989 Other specified soft tissue disorders: Secondary | ICD-10-CM | POA: Diagnosis not present

## 2019-10-26 DIAGNOSIS — Z90410 Acquired total absence of pancreas: Secondary | ICD-10-CM | POA: Diagnosis not present

## 2019-10-26 DIAGNOSIS — Q858 Other phakomatoses, not elsewhere classified: Secondary | ICD-10-CM | POA: Diagnosis not present

## 2019-10-26 DIAGNOSIS — Z881 Allergy status to other antibiotic agents status: Secondary | ICD-10-CM | POA: Insufficient documentation

## 2019-10-26 DIAGNOSIS — I10 Essential (primary) hypertension: Secondary | ICD-10-CM | POA: Insufficient documentation

## 2019-10-26 DIAGNOSIS — Z905 Acquired absence of kidney: Secondary | ICD-10-CM | POA: Insufficient documentation

## 2019-10-26 DIAGNOSIS — E891 Postprocedural hypoinsulinemia: Secondary | ICD-10-CM | POA: Diagnosis not present

## 2019-10-26 LAB — CBC WITH DIFFERENTIAL/PLATELET
Abs Immature Granulocytes: 0.04 10*3/uL (ref 0.00–0.07)
Basophils Absolute: 0 10*3/uL (ref 0.0–0.1)
Basophils Relative: 1 %
Eosinophils Absolute: 0.1 10*3/uL (ref 0.0–0.5)
Eosinophils Relative: 1 %
HCT: 37.8 % — ABNORMAL LOW (ref 39.0–52.0)
Hemoglobin: 11.7 g/dL — ABNORMAL LOW (ref 13.0–17.0)
Immature Granulocytes: 1 %
Lymphocytes Relative: 6 %
Lymphs Abs: 0.4 10*3/uL — ABNORMAL LOW (ref 0.7–4.0)
MCH: 31 pg (ref 26.0–34.0)
MCHC: 31 g/dL (ref 30.0–36.0)
MCV: 100 fL (ref 80.0–100.0)
Monocytes Absolute: 0.4 10*3/uL (ref 0.1–1.0)
Monocytes Relative: 7 %
Neutro Abs: 5.2 10*3/uL (ref 1.7–7.7)
Neutrophils Relative %: 84 %
Platelets: 173 10*3/uL (ref 150–400)
RBC: 3.78 MIL/uL — ABNORMAL LOW (ref 4.22–5.81)
RDW: 14.6 % (ref 11.5–15.5)
WBC: 6.1 10*3/uL (ref 4.0–10.5)
nRBC: 0 % (ref 0.0–0.2)

## 2019-10-26 LAB — PROTIME-INR
INR: 1.1 (ref 0.8–1.2)
Prothrombin Time: 14.5 seconds (ref 11.4–15.2)

## 2019-10-26 LAB — GLUCOSE, CAPILLARY: Glucose-Capillary: 287 mg/dL — ABNORMAL HIGH (ref 70–99)

## 2019-10-26 MED ORDER — SODIUM CHLORIDE 0.9 % IV SOLN
INTRAVENOUS | Status: DC
  Administered 2019-10-26: 09:00:00 via INTRAVENOUS

## 2019-10-26 MED ORDER — LIDOCAINE HCL (PF) 1 % IJ SOLN
INTRAMUSCULAR | Status: AC | PRN
  Administered 2019-10-26: 5 mL

## 2019-10-26 MED ORDER — FENTANYL CITRATE (PF) 100 MCG/2ML IJ SOLN
INTRAMUSCULAR | Status: AC
  Filled 2019-10-26: qty 2

## 2019-10-26 NOTE — Progress Notes (Signed)
Patient signs for himself.  Nash Dimmer, RN spoke with patients wife about d/c instructions post IR procedure.  10/26/19 [place]  Does Patient Have a [place]? No

## 2019-10-26 NOTE — Procedures (Signed)
Interventional Radiology Procedure:   Indications: Metastatic neuroendocrine tumor, VHL with RCC.  Chronic soft tissue lesion adjacent to right femur with new cortical destruction, need to exclude metastatic disease  Procedure: CT guided biopsy of soft tissue mass lateral to right femur  Findings: Multiple small cores obtained.  Complications: None     EBL: less than 10 ml  Plan: Discharge to home.    Scott Cegielski R. Anselm Pancoast, MD  Pager: [phone]

## 2019-10-26 NOTE — Discharge Instructions (Addendum)
Needle Biopsy, Care After °These instructions tell you how to care for yourself after your procedure. Your doctor may also give you more specific instructions. Call your doctor if you have any problems or questions. °What can I expect after the procedure? °After the procedure, it is common to have: °· Soreness. °· Bruising. °· Mild pain. °Follow these instructions at home: ° °· Return to your normal activities as told by your doctor. Ask your doctor what activities are safe for you. °· Take over-the-counter and prescription medicines only as told by your doctor. °· Wash your hands with soap and water before you change your bandage (dressing). If you cannot use soap and water, use hand sanitizer. °· Follow instructions from your doctor about: °? How to take care of your puncture site. °? When and how to change your bandage. °? When to remove your bandage. °· Check your puncture site every day for signs of infection. Watch for: °? Redness, swelling, or pain. °? Fluid or blood.  °? Pus or a bad smell. °? Warmth. °· Do not take baths, swim, or use a hot tub until your doctor approves. Ask your doctor if you may take showers. You may only be allowed to take sponge baths. °· Keep all follow-up visits as told by your doctor. This is important. °Contact a doctor if you have: °· A fever. °· Redness, swelling, or pain at the puncture site, and it lasts longer than a few days. °· Fluid, blood, or pus coming from the puncture site. °· Warmth coming from the puncture site. °Get help right away if: °· You have a lot of bleeding from the puncture site. °Summary °· After the procedure, it is common to have soreness, bruising, or mild pain at the puncture site. °· Check your puncture site every day for signs of infection, such as redness, swelling, or pain. °· Get help right away if you have severe bleeding from your puncture site. °This information is not intended to replace advice given to you by your health care provider. Make  sure you discuss any questions you have with your health care provider. °Document Released: 11/04/2008 Document Revised: 12/05/2017 Document Reviewed: 12/05/2017 °Elsevier Patient Education © 2020 [employer] °Moderate Conscious Sedation, Adult, Care After °These instructions provide you with information about caring for yourself after your procedure. Your health care provider may also give you more specific instructions. Your treatment has been planned according to current medical practices, but problems sometimes occur. Call your health care provider if you have any problems or questions after your procedure. °What can I expect after the procedure? °After your procedure, it is common: °· To feel sleepy for several hours. °· To feel clumsy and have poor balance for several hours. °· To have poor judgment for several hours. °· To vomit if you eat too soon. °Follow these instructions at home: °For at least 24 hours after the procedure: ° °· Do not: °? Participate in activities where you could fall or become injured. °? Drive. °? Use heavy machinery. °? Drink alcohol. °? Take sleeping pills or medicines that cause drowsiness. °? Make important decisions or sign legal documents. °? Take care of children on your own. °· Rest. °Eating and drinking °· Follow the diet recommended by your health care provider. °· If you vomit: °? Drink water, juice, or soup when you can drink without vomiting. °? Make sure you have little or no nausea before eating solid foods. °General instructions °· Have a responsible adult stay with   you until you are awake and alert. °· Take over-the-counter and prescription medicines only as told by your health care provider. °· If you smoke, do not smoke without supervision. °· Keep all follow-up visits as told by your health care provider. This is important. °Contact a health care provider if: °· You keep feeling nauseous or you keep vomiting. °· You feel light-headed. °· You develop a rash. °· You  have a fever. °Get help right away if: °· You have trouble breathing. °This information is not intended to replace advice given to you by your health care provider. Make sure you discuss any questions you have with your health care provider. °Document Released: 09/12/2013 Document Revised: 11/04/2017 Document Reviewed: 03/13/2016 °Elsevier Patient Education © 2020 Elsevier Inc. ° °

## 2019-10-26 NOTE — H&P (Signed)
Referring Physician(s): Ennever,Peter R  Supervising Physician: Markus Daft  Patient Status:  [name]  Chief Complaint: "I'm here for a biopsy"   Subjective: Patient familiar to IR service from right and left hepatic Y 90 radioembolization treatments in 2011, repeat treatment on right in 2013 and left in 2014, liver lesion biopsy 2017, right hepatic Y 90 in 2017 and left renal mass biopsy on 07/04/2019 which revealed renal cell carcinoma.  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 3. Spinal lesions are not well seen on today's exam. 4. Cortical destruction along the lateral margin of the right proximal femur with a masslike area measuring 4.7 x 2.7 cm. This is concerning for a metastatic lesion. 5. Central canal lesions are not seen on today's study due to bolus timing. 6. Moderate-to-marked rectal thickening with perirectal stranding. Correlate with any symptoms of proctocolitis. Direct visualization may be warranted given progressive nature of this finding is stranding and thickening has been present to varying degrees on prior studies. 7. Atherosclerotic changes in the aorta.  He presents today for CT-guided biopsy of the soft tissue mass lateral to the right femur for further evaluation.  He currently denies fever, headache, chest pain, dyspnea, cough, abdominal pain, vomiting or bleeding.  He has had some occasional nausea and back pain.  Past Medical History:  Diagnosis Date  . Anemia 08/19/2014  . 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 Diabetes mellitus without complication ([place])   . Goals of care, counseling/discussion 06/13/2019  .  Headache(784.0)    neuropathic pain usually behind eyes related to blood pressure  . HTN (hypertension) 08/19/2014  . Iron deficiency anemia, unspecified 07/10/2014  . 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 Orthostatic hypotension 08/19/2014  . Renal cell cancer, left ([place]) 06/13/2019  . Transfusion history    '09 .   Marland Kitchen Tumor of optic nerve    right -"stable"  . 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 BACK SURGERY     '91- T#-T5 "tumor resection"-benign  . CERVICAL SPINE SURGERY     '07- resection tumor C5-C7-"benign"  . 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 COLONOSCOPY WITH PROPOFOL N/A 08/20/2014   Procedure: COLONOSCOPY WITH PROPOFOL;  Surgeon: Cleotis Nipper, MD;  [facility];  Service: Endoscopy;  Laterality: N/A;  . CRANIOTOMY FOR TUMOR     medulla tumor "benign tumor"-salvage own bone for closure  . CYSTOSCOPY WITH LITHOLAPAXY N/A 02/04/2014   Procedure: CYSTOSCOPY WITH LITHOLAPAXY;  Surgeon: Franchot Gallo, MD;  Location: [facility];  Service: Urology;  Laterality: N/A;  . ENUCLEATION Left    '05 with prosthesis  . FLEXIBLE SIGMOIDOSCOPY N/A 09/26/2015   Procedure: FLEXIBLE SIGMOIDOSCOPY;  Surgeon: Laurence Spates, MD;  [facility];  Service: Endoscopy;  Laterality: N/A;  . INSERTION OF SUPRAPUBIC CATHETER N/A 02/04/2014   Procedure: INSERTION OF SUPRAPUBIC CATHETER;  Surgeon: Franchot Gallo, MD;  [facility]: [facility];  Service: Urology;  Laterality: N/A;  . IR GENERIC HISTORICAL  07/15/2016   IR ANGIOGRAM VISCERAL SELECTIVE 07/15/2016 Arne Cleveland, MD  [facility]  . IR GENERIC HISTORICAL  07/15/2016   IR EMBO ARTERIAL NOT HEMORR HEMANG INC GUIDE ROADMAPPING 07/15/2016 Arne Cleveland, MD [facility]  . IR GENERIC HISTORICAL  07/15/2016   IR ANGIOGRAM SELECTIVE EACH ADDITIONAL VESSEL 07/15/2016 Arne Cleveland, MD [facility]   . IR GENERIC HISTORICAL  07/15/2016   IR ANGIOGRAM SELECTIVE EACH ADDITIONAL VESSEL 07/15/2016 Arne Cleveland, MD [facility]  . IR GENERIC HISTORICAL  07/15/2016   IR ANGIOGRAM SELECTIVE EACH ADDITIONAL VESSEL 07/15/2016 Arne Cleveland, MD [facility]  . IR GENERIC HISTORICAL  07/15/2016   IR ANGIOGRAM SELECTIVE EACH ADDITIONAL VESSEL 07/15/2016 Arne Cleveland, MD [facility]  . IR GENERIC HISTORICAL  07/15/2016   IR US GUIDE VASC ACCESS RIGHT 07/15/2016 Arne Cleveland, MD [facility]  . IR GENERIC HISTORICAL  07/15/2016   IR ANGIOGRAM VISCERAL SELECTIVE 07/15/2016 Arne Cleveland, MD [facility]  . IR GENERIC HISTORICAL  07/15/2016   IR ANGIOGRAM SELECTIVE EACH ADDITIONAL VESSEL 07/15/2016 Arne Cleveland, MD [facility]  . IR GENERIC HISTORICAL  07/28/2016   IR ANGIOGRAM VISCERAL SELECTIVE 07/28/2016 Arne Cleveland, MD [facility]  . IR GENERIC HISTORICAL  07/28/2016   IR US GUIDE VASC ACCESS RIGHT 07/28/2016 Arne Cleveland, MD [facility]  . IR GENERIC HISTORICAL  07/28/2016   IR EMBO TUMOR ORGAN ISCHEMIA INFARCT INC GUIDE ROADMAPPING 07/28/2016 Arne Cleveland, MD [facility]  . IR GENERIC HISTORICAL  06/30/2016   IR RADIOLOGIST EVAL & MGMT 06/30/2016 Greggory Keen, MD [facility]  . MUSCLE FLAP MYOCUTANEOUS / FASCIOCUTANEOUS OF TRUNK Left    '01  . PANCREAS SURGERY     '09- with partial liver resection/ pancreas "[place]"  . PARTIAL NEPHRECTOMY Right    '09- cancer  . PERIPHERALLY INSERTED CENTRAL CATHETER INSERTION     past hx.  . RECONSTRUCTION BREAST W/ TRAM FLAP        Allergies: Ciprofloxacin, Citalopram, and Ciprocin-fluocin-procin [fluocinolone acetonide]  Medications: Prior to Admission medications   Medication Sig Start Date End Date Taking? Authorizing Provider  ACCU-CHEK SMARTVIEW test strip  05/10/16   [provider]  Ascorbic Acid (VITAMIN C) 1000 MG tablet Take 1,000 mg by mouth daily.    [provider]  axitinib (INLYTA) 5 MG tablet  Take 1 tablet (5 mg total) by mouth 2 (two) times daily. 07/27/19   Volanda Napoleon, MD  baclofen (LIORESAL) 20 MG tablet TAKE 1 TO 2 TABLETS BY MOUTH 4 TIMES A DAY Patient taking differently: Take 20-40 mg by mouth 4 (four) times daily.  04/12/19   Volanda Napoleon, MD  CRANBERRY CONCENTRATE PO Take 1 capsule by mouth 3 (three) times daily.    [provider]  CREON 24000-76000 units CPEP TAKE 3 CAPSULES BY MOUTH THREE TIMES DAILY WITH MEALS 10/12/19   Ennever, Rudell Cobb, MD  diazepam (VALIUM) 5 MG tablet TAKE 1 TABLET BY MOUTH 3 TIMES DAILY FOR SPASMS/ANXIETY 10/12/19   Volanda Napoleon, MD  diphenoxylate-atropine (LOMOTIL) 2.5-0.025 MG tablet TAKE 1 TABLET BY MOUTH 4 TIMES DAILY AS NEEDED FOR DIARRHEA OR LOOSE STOOLS Patient taking differently: Take 1 tablet by mouth 4 (four) times daily as needed for diarrhea or loose stools.  02/26/19   Volanda Napoleon, MD  doxazosin (CARDURA) 2 MG tablet Take 1 tablet (2 mg total) by mouth 2 (two) times daily. 08/14/19   Volanda Napoleon, MD  dronabinol (MARINOL) 5 MG capsule TAKE 1 CAPSULE BY MOUTH TWICE DAILY WITH MEALS 10/19/19   Volanda Napoleon,  MD  fentaNYL (DURAGESIC) 12 MCG/HR APPLY 1 PATCH ON THE SKIN EVERY 3 DAYS 10/10/19   Volanda Napoleon, MD  gabapentin (NEURONTIN) 300 MG capsule Take 2 capsules (600 mg total) by mouth 3 (three) times daily. 07/31/19   Volanda Napoleon, MD  Glucagon (BAQSIMI ONE PACK) 3 MG/DOSE POWD Place 1 spray into the nose daily as needed (low glucose).    [provider]  HYDROcodone-acetaminophen (NORCO/VICODIN) 5-325 MG tablet Take 1-2 tablets by mouth every 6 (six) hours as needed for moderate pain. 08/04/18   Volanda Napoleon, MD  HYDROmorphone (DILAUDID) 4 MG tablet Take 1 tablet (4 mg total) by mouth every 6 (six) hours as needed for severe pain. 10/10/19   Volanda Napoleon, MD  insulin glargine (LANTUS) 100 UNIT/ML injection Inject 25 Units into the skin daily before breakfast.     [provider]   insulin lispro (HUMALOG) 100 UNIT/ML injection Inject 3-9 Units into the skin 3 (three) times daily before meals. SS    [provider]  midodrine (PROAMATINE) 5 MG tablet TAKE 0.5 TABLET BY MOUTH TWICE A DAY AS NEEDED Patient taking differently: Take 2.5 mg by mouth 2 (two) times daily as needed (blood pressure).  10/11/18   Volanda Napoleon, MD  ondansetron (ZOFRAN) 8 MG tablet Take 1 tablet (8 mg total) by mouth 2 (two) times daily as needed for nausea or vomiting. 10/16/19   Gus Height, MD  oxybutynin (DITROPAN) 5 MG tablet Take 1 tablet (5 mg total) by mouth 2 (two) times daily as needed for bladder spasms. 07/27/19   Volanda Napoleon, MD  prochlorperazine (COMPAZINE) 10 MG tablet Take 1 tablet (10 mg total) by mouth every 6 (six) hours as needed (Nausea or vomiting). 08/09/19   Volanda Napoleon, MD  promethazine (PHENERGAN) 12.5 MG tablet Take 1 tablet (12.5 mg total) by mouth every 6 (six) hours as needed for nausea. 05/02/19   Volanda Napoleon, MD  solifenacin (VESICARE) 5 MG tablet TAKE 1 TABLET BY MOUTH ONCE DAILY 09/24/19   Volanda Napoleon, MD  tiZANidine (ZANAFLEX) 4 MG tablet Take 2 tablets (8 mg total) by mouth every 6 (six) hours as needed. For pain 06/29/19   Volanda Napoleon, MD  traMADol (ULTRAM) 50 MG tablet TAKE 1 TABLET BY MOUTH 3 TIMES DAILY 09/24/19   [place], Holli Humbles, NP     Vital Signs: BP 128/78 (BP Location: Right Arm)   Pulse 62   Temp 97.9 F (36.6 C) (Oral)   Resp 18   SpO2 97%   Physical Exam awake, alert.  Chest clear to auscultation bilaterally anteriorly.  Heart with regular rate and rhythm.  Abdomen soft, positive bowel sounds, nontender.  Some edema noted around right upper lateral thigh region.  Paraplegic from waist down.    Imaging: No results found.  Labs:  CBC: Recent Labs    08/21/19 0825 09/19/19 1352 10/10/19 1205 10/16/19 0823  WBC 6.4 6.6 6.9 5.2  HGB 12.8* 12.0* 11.1* 10.9*  HCT 39.4 37.6* 34.0* 34.2*  PLT 157 135* 154 163     COAGS: Recent Labs    07/04/19 1219  INR 1.2    BMP: Recent Labs    08/21/19 0825 09/19/19 1352 10/10/19 1205 10/16/19 0823  NA 137 134* 132* 137  K 3.5 3.9 3.7 3.6  CL 102 100 98 107  CO2 24 29 25 24   GLUCOSE 84 176* 327* 125*  BUN 16 21* 25* 20  CALCIUM  8.0* 8.6* 8.3* 8.2*  CREATININE 0.73 0.64 0.88 0.69  GFRNONAA >60 >60 >60 >60  GFRAA >60 >60 >60 >60    LIVER FUNCTION TESTS: Recent Labs    08/21/19 0825 09/19/19 1352 10/10/19 1205 10/16/19 0823  BILITOT 0.6 0.6 0.5 1.2  AST 19 16 13* 17  ALT 14 9 9 14   ALKPHOS 105 91 96 105  PROT 6.4* 6.7 6.4* 6.7  ALBUMIN 2.5* 3.1* 3.2* 3.0*    Assessment and Plan: Pt with hx RCC and 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  He is status post prior hepatic Y 90 radio embolizations in 2011, 2013/14 and 2017.  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 3. Spinal lesions are not well seen on today's exam. 4. Cortical destruction along the lateral margin of the right proximal femur with a masslike area measuring 4.7 x 2.7 cm. This is concerning for a metastatic lesion. 5. Central canal lesions are not seen on today's study due to bolus timing. 6. Moderate-to-marked rectal thickening with perirectal stranding. Correlate with any symptoms of proctocolitis. Direct visualization may be warranted given progressive nature of this finding is stranding and thickening has been present to varying degrees on prior studies. 7. Atherosclerotic changes in the aorta.  He presents today for CT-guided biopsy of the soft tissue mass lateral to the right femur for further evaluation.Risks and benefits of procedure was discussed with the patient  including, but not limited to bleeding,  infection, damage to adjacent structures or low yield requiring additional tests.  All of the questions were answered and there is agreement to proceed.  Consent signed and in chart.  LABS PENDING   Electronically Signed: D. Rowe Robert, PA-C 10/26/2019, 8:26 AM   I spent a total of 25 minutes at the the patient's bedside AND on the patient's hospital floor or unit, greater than 50% of which was counseling/coordinating care for CT-guided biopsy of right lateral thigh soft tissue mass

## 2019-10-29 ENCOUNTER — Telehealth: Payer: Self-pay | Admitting: *Deleted

## 2019-10-29 LAB — SURGICAL PATHOLOGY

## 2019-10-29 MED FILL — SOLIFENACIN SUCCINATE 5 MG: 5 | 30 days supply | Qty: 30 | Fill #1

## 2019-10-29 MED FILL — traMADol HCL 50 MG TABS: 50 | 30 days supply | Qty: 90 | Fill #1

## 2019-10-29 NOTE — Telephone Encounter (Signed)
Notified pt wife of Path results. Per MD, no need to go to appt for radiation 11/25. Message to scheduling to cancel appt.

## 2019-10-29 NOTE — Telephone Encounter (Signed)
-----   Message from Volanda Napoleon, MD sent at 10/29/2019  1:42 PM EST ----- Call - NO obvious cancer is seen.  Of course, the pathologist cannot be 100% confident.  [name]

## 2019-10-31 ENCOUNTER — Ambulatory Visit: Payer: 59

## 2019-10-31 ENCOUNTER — Ambulatory Visit
Admission: RE | Admit: 2019-10-31 | Discharge: 2019-10-31 | Disposition: A | Discharge status: Home / Self Care | Payer: 59 | Source: Ambulatory Visit | Attending: Radiation Oncology | Admitting: Radiation Oncology

## 2019-11-05 ENCOUNTER — Other Ambulatory Visit: Payer: Self-pay | Admitting: Hematology & Oncology

## 2019-11-05 DIAGNOSIS — C7A8 Other malignant neuroendocrine tumors: Secondary | ICD-10-CM

## 2019-11-05 DIAGNOSIS — D500 Iron deficiency anemia secondary to blood loss (chronic): Secondary | ICD-10-CM

## 2019-11-05 MED FILL — OXYBUTYNIN CHLORIDE 5 MG TA: 5 | 30 days supply | Qty: 60 | Fill #3

## 2019-11-05 MED FILL — BACLOFEN 20 MG TABLET: 20 | 15 days supply | Qty: 120 | Fill #0

## 2019-11-05 MED FILL — LANTUS 100 UNITS/ML VIAL: 100 | 28 days supply | Qty: 10 | Fill #1

## 2019-11-12 MED FILL — CREON DR 24,000 UNITS CAP: 24000-76000 | 30 days supply | Qty: 270 | Fill #1

## 2019-11-12 MED FILL — GABAPENTIN 300 MG CAPSULE: 300 | 20 days supply | Qty: 120 | Fill #5

## 2019-11-20 MED FILL — DRONABINOL 5 MG CAP: 5 | 30 days supply | Qty: 60 | Fill #1

## 2019-11-21 ENCOUNTER — Inpatient Hospital Stay: Payer: 59

## 2019-11-21 ENCOUNTER — Encounter: Payer: Self-pay | Admitting: Hematology & Oncology

## 2019-11-21 ENCOUNTER — Inpatient Hospital Stay: Payer: 59 | Admitting: Hematology & Oncology

## 2019-11-23 DIAGNOSIS — Z794 Long term (current) use of insulin: Secondary | ICD-10-CM | POA: Diagnosis not present

## 2019-11-23 DIAGNOSIS — E109 Type 1 diabetes mellitus without complications: Secondary | ICD-10-CM | POA: Diagnosis not present

## 2019-11-26 MED FILL — traMADol HCL 50 MG TABS: 50 | 30 days supply | Qty: 90 | Fill #2

## 2019-11-26 MED FILL — SOLIFENACIN SUCCINATE 5 MG: 5 | 30 days supply | Qty: 30 | Fill #2

## 2019-11-27 ENCOUNTER — Ambulatory Visit (HOSPITAL_COMMUNITY): Payer: 59

## 2019-11-28 ENCOUNTER — Other Ambulatory Visit: Payer: Self-pay

## 2019-11-28 ENCOUNTER — Encounter: Payer: Self-pay | Admitting: Hematology & Oncology

## 2019-11-28 ENCOUNTER — Inpatient Hospital Stay: Payer: 59 | Attending: Hematology & Oncology

## 2019-11-28 ENCOUNTER — Inpatient Hospital Stay (HOSPITAL_BASED_OUTPATIENT_CLINIC_OR_DEPARTMENT_OTHER): Payer: 59 | Admitting: Hematology & Oncology

## 2019-11-28 ENCOUNTER — Inpatient Hospital Stay: Payer: 59

## 2019-11-28 VITALS — BP 122/84 | HR 78

## 2019-11-28 VITALS — BP 105/73 | HR 104 | Temp 97.5°F | Resp 20

## 2019-11-28 DIAGNOSIS — C649 Malignant neoplasm of unspecified kidney, except renal pelvis: Secondary | ICD-10-CM | POA: Diagnosis not present

## 2019-11-28 DIAGNOSIS — Q858 Other phakomatoses, not elsewhere classified: Secondary | ICD-10-CM | POA: Insufficient documentation

## 2019-11-28 DIAGNOSIS — K922 Gastrointestinal hemorrhage, unspecified: Secondary | ICD-10-CM | POA: Diagnosis not present

## 2019-11-28 DIAGNOSIS — D500 Iron deficiency anemia secondary to blood loss (chronic): Secondary | ICD-10-CM | POA: Diagnosis not present

## 2019-11-28 DIAGNOSIS — Z79899 Other long term (current) drug therapy: Secondary | ICD-10-CM | POA: Insufficient documentation

## 2019-11-28 DIAGNOSIS — C642 Malignant neoplasm of left kidney, except renal pelvis: Secondary | ICD-10-CM

## 2019-11-28 DIAGNOSIS — C7A8 Other malignant neuroendocrine tumors: Secondary | ICD-10-CM | POA: Diagnosis not present

## 2019-11-28 LAB — CMP (CANCER CENTER ONLY)
ALT: 10 U/L (ref 0–44)
AST: 15 U/L (ref 15–41)
Albumin: 3.3 g/dL — ABNORMAL LOW (ref 3.5–5.0)
Alkaline Phosphatase: 93 U/L (ref 38–126)
Anion gap: 8 (ref 5–15)
BUN: 19 mg/dL (ref 6–20)
CO2: 30 mmol/L (ref 22–32)
Calcium: 8.8 mg/dL — ABNORMAL LOW (ref 8.9–10.3)
Chloride: 96 mmol/L — ABNORMAL LOW (ref 98–111)
Creatinine: 0.71 mg/dL (ref 0.61–1.24)
GFR, Est AFR Am: >60 mL/min (ref >60)
GFR, Estimated: >60 mL/min (ref >60)
Glucose, Bld: 269 mg/dL — ABNORMAL HIGH (ref 70–99)
Potassium: 4.1 mmol/L (ref 3.5–5.1)
Sodium: 134 mmol/L — ABNORMAL LOW (ref 135–145)
Total Bilirubin: 0.6 mg/dL (ref 0.3–1.2)
Total Protein: 6.8 g/dL (ref 6.5–8.1)

## 2019-11-28 LAB — CBC WITH DIFFERENTIAL (CANCER CENTER ONLY)
Abs Immature Granulocytes: 0.02 10*3/uL (ref 0.00–0.07)
Basophils Absolute: 0 10*3/uL (ref 0.0–0.1)
Basophils Relative: 1 %
Eosinophils Absolute: 0.1 10*3/uL (ref 0.0–0.5)
Eosinophils Relative: 2 %
HCT: 37 % — ABNORMAL LOW (ref 39.0–52.0)
Hemoglobin: 11.9 g/dL — ABNORMAL LOW (ref 13.0–17.0)
Immature Granulocytes: 0 %
Lymphocytes Relative: 8 %
Lymphs Abs: 0.4 10*3/uL — ABNORMAL LOW (ref 0.7–4.0)
MCH: 31.2 pg (ref 26.0–34.0)
MCHC: 32.2 g/dL (ref 30.0–36.0)
MCV: 97.1 fL (ref 80.0–100.0)
Monocytes Absolute: 0.5 10*3/uL (ref 0.1–1.0)
Monocytes Relative: 9 %
Neutro Abs: 4 10*3/uL (ref 1.7–7.7)
Neutrophils Relative %: 80 %
Platelet Count: 108 10*3/uL — ABNORMAL LOW (ref 150–400)
RBC: 3.81 MIL/uL — ABNORMAL LOW (ref 4.22–5.81)
RDW: 13.2 % (ref 11.5–15.5)
WBC Count: 5 10*3/uL (ref 4.0–10.5)
nRBC: 0 % (ref 0.0–0.2)

## 2019-11-28 LAB — FERRITIN: Ferritin: 440 ng/mL — ABNORMAL HIGH (ref 24–336)

## 2019-11-28 LAB — IRON AND TIBC
Iron: 66 ug/dL (ref 42–163)
Saturation Ratios: 32 % (ref 20–55)
TIBC: 204 ug/dL (ref 202–409)
UIBC: 138 ug/dL (ref 117–376)

## 2019-11-28 MED ORDER — LANREOTIDE ACETATE 120 MG/0.5ML ~~LOC~~ SOLN
SUBCUTANEOUS | Status: AC
  Filled 2019-11-28: qty 120

## 2019-11-28 MED ORDER — LANREOTIDE ACETATE 120 MG/0.5ML ~~LOC~~ SOLN: 120.0000 mg | Freq: Once | SUBCUTANEOUS | Status: DC

## 2019-11-28 MED ORDER — SODIUM CHLORIDE 0.9 % IV SOLN
510.0000 mg | Freq: Once | INTRAVENOUS | Status: AC
  Administered 2019-11-28: 510 mg via INTRAVENOUS
  Filled 2019-11-28: qty 17

## 2019-11-28 MED ORDER — SODIUM CHLORIDE 0.9 % IV SOLN
INTRAVENOUS | Status: DC
  Filled 2019-11-28: qty 250

## 2019-11-28 NOTE — Progress Notes (Signed)
Somatuline on hold today for Lutathera 12/11/19 per Dr. Marin Olp.

## 2019-11-28 NOTE — Patient Instructions (Signed)

## 2019-11-28 NOTE — Progress Notes (Signed)
Hematology and Oncology Follow Up Visit  [name] [id number] 1960/12/14 57 y.o. 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  Current Therapy:  Temodar/Xeloda - start 03/17/2016 - s/p c#3 - discontinued Somatuline 120mg  SQ q month Lutathera -- cycle #2/4 on 10/26/2019 IV iron as indicated-Feraheme given on 11/2314/2020 Inlyta/Keytruda -- start on 08/08/2019 -- d/c on 08/26/2019 due to toxicity     Interim History:  [name] is back for followup.  She is doing fairly well.  He did have a second Lutathera treatment on November 20.  He tolerated this pretty well.  He has had no problems with abdominal pain.  He has had a some spasms.  He has a indwelling suprapubic Foley catheter.  This is because some irritation for him.  He has had no problems with flushing.  He has had no blood pressure variation.  He has had no nausea or vomiting.  His stools have always been a little on the loose side.  He did have a biopsy of this lesion in the right femur.  This was done on 10/26/2019.  The pathology report ([id number]) did not show any malignancy.  Of course, the pathologist said that they can "not entirely exclude necrotic tumor."  I am confident however that there is no cancer in this area.  He will get iron today.  His iron studies that were done back in November showed a ferritin 464 with an iron saturation of 7%.  His last chromogranin A level was 53 in August.  He did have a nice Thanksgiving.  Unfortunately, one of their dogs has lymphoma.  She is a little elderly.  They are not going to put her through any treatment.  I do feel bad about this.  His blood sugars have been doing pretty well.  They do tend to fluctuate.  His wife does a great job in trying to keep these under control.  Currently, I would say his performance status  is ECOG 1.    Medications:  Current Outpatient Medications:  .  ACCU-CHEK SMARTVIEW test strip, , Disp: , Rfl: 10 .  baclofen (LIORESAL) 10 mg/mL SUSP, , Disp: , Rfl:  .  baclofen (LIORESAL) 20 MG tablet, , Disp: , Rfl:  .  Balsam [place]-Castor Oil (VENELEX) OINT, Apply topically as needed, Disp: 60 g, Rfl: 4 .  capecitabine (XELODA) 500 MG tablet, Take by mouth., Disp: , Rfl:  .  chlorproMAZINE (THORAZINE) 10 MG tablet, Take by mouth., Disp: , Rfl:  .  diazepam (VALIUM) 5 MG tablet, TAKE 1 TABLET BY MOUTH 3 TIMES DAILY FOR SPASMS/ANXIETY, Disp: 90 tablet, Rfl: 1 .  diphenoxylate-atropine (LOMOTIL) 2.5-0.025 MG tablet, TAKE 1 TABLET BY MOUTH 4 TIMES DAILY AS NEEDED FOR DIARRHEA OR LOOSE STOOLS, Disp: 100 tablet, Rfl: 0 .  doxazosin (CARDURA) 1 MG tablet, Take 1 tablet (1 mg total) by mouth 2 (two) times daily. (Patient taking differently: Take 1 mg by mouth 2 (two) times daily as needed (blood pressure spikes.). ), Disp: 60 tablet, Rfl: 2 .  dronabinol (MARINOL) 5 MG capsule, TAKE 1 CAPSULE BY MOUTH TWICE DAILY WITH MEALS, Disp: 60 capsule, Rfl: 3 .  gabapentin (NEURONTIN) 300 MG capsule, TAKE 2 CAPSULES BY MOUTH 3 TIMES DAILY., Disp: 180 capsule, Rfl: 6 .  HYDROcodone-acetaminophen (NORCO/VICODIN) 5-325 MG tablet, Take 1-2 tablets by mouth every 6 (six) hours as needed  for moderate pain., Disp: 90 tablet, Rfl: 0 .  HYDROmorphone (DILAUDID) 4 MG tablet, Take 1 tablet (4 mg total) by mouth every 6 (six) hours as needed for severe pain., Disp: 40 tablet, Rfl: 0 .  insulin glargine (LANTUS) 100 UNIT/ML injection, Inject 31 Units into the skin daily before breakfast. , Disp: , Rfl:  .  insulin lispro (HUMALOG) 100 UNIT/ML injection, Inject 3-9 Units into the skin 3 (three) times daily before meals. SS, Disp: , Rfl:  .  lipase/protease/amylase (CREON) 12000 units CPEP capsule, Take by mouth., Disp: , Rfl:  .  midodrine (PROAMATINE) 5 MG tablet, TKAE 1/2 TABLET BY MOUTH TWICE A DAY AS NEEDED, Disp: 60  tablet, Rfl: 6 .  NON FORMULARY, Take by mouth., Disp: , Rfl:  .  oxybutynin (DITROPAN) 5 MG tablet, 5 mg 2 (two) times daily. , Disp: , Rfl: 3 .  promethazine (PHENERGAN) 12.5 MG tablet, Take 12.5 mg by mouth every 6 (six) hours as needed for nausea. , Disp: , Rfl:  .  tiZANidine (ZANAFLEX) 4 MG tablet, TAKE 2 TABLETS BY MOUTH EVERY 6 HOURS AS NEEDED FOR PAIN, Disp: 30 tablet, Rfl: 3 .  traMADol (ULTRAM) 50 MG tablet, TAKE 1 TABLET BY MOUTH 3 TIMES DAILY, Disp: 90 tablet, Rfl: 2  Allergies:  Allergies  Allergen Reactions  . Citalopram Diarrhea  . Ciprocin-Fluocin-Procin [Fluocinolone Acetonide] Rash    Pt states this happened with IV Cipro. ABLE TO TAKE PO CIPRO.  . Other Rash    IV-CIPRO    Past Medical History, Surgical history, Social history, and Family History were reviewed and updated.  Review of Systems: Review of Systems  Constitutional: Negative.   HENT: Negative.   Eyes: Negative.   Respiratory: Negative.   Cardiovascular: Negative.   Gastrointestinal: Positive for diarrhea and nausea.  Genitourinary: Positive for frequency.  Musculoskeletal: Positive for myalgias and neck pain.  Skin: Negative.   Neurological: Negative.   Endo/Heme/Allergies: Negative.   Psychiatric/Behavioral: Negative.      Physical Exam:  oral temperature is 97.5 F (36.4 C) (abnormal). His blood pressure is 82/54 (abnormal) and his pulse is 94. His respiration is 18 and oxygen saturation is 99%.   Physical Exam Vitals reviewed.  HENT:     Head: Normocephalic and atraumatic.  Eyes:     Pupils: Pupils are equal, round, and reactive to light.  Cardiovascular:     Rate and Rhythm: Normal rate and regular rhythm.     Heart sounds: Normal heart sounds.  Pulmonary:     Effort: Pulmonary effort is normal.     Breath sounds: Normal breath sounds.  Abdominal:     General: Bowel sounds are normal.     Palpations: Abdomen is soft.     Comments: He has a suprapubic catheter in place    Musculoskeletal:        General: No tenderness or deformity. Normal range of motion.     Cervical back: Normal range of motion.     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  Skin:    General: Skin is warm and dry.     Findings: No erythema or rash.  Neurological:     Mental Status: He is alert and oriented to person, place, and time.  Psychiatric:        Behavior: Behavior normal.        Thought Content: Thought content normal.  Judgment: Judgment normal.   .  Lab Results  Component Value Date   WBC 11.3 (H) 01/09/2018   HGB 13.8 12/05/2017   HCT 43.8 01/09/2018   MCV 86.4 01/09/2018   PLT 288 01/09/2018     Chemistry      Component Value Date/Time   NA 137 01/09/2018 1118   NA 137 12/05/2017 0840   NA 138 10/12/2016 0955   K 3.3 01/09/2018 1118   K 3.4 12/05/2017 0840   K 3.3 (L) 10/12/2016 0955   CL 104 01/09/2018 1118   CL 101 12/05/2017 0840   CO2 30 01/09/2018 1118   CO2 29 12/05/2017 0840   CO2 25 10/12/2016 0955   BUN 14 01/09/2018 1118   BUN 16 12/05/2017 0840   BUN 22.0 10/12/2016 0955   CREATININE 0.8 12/05/2017 0840   CREATININE 0.8 10/12/2016 0955      Component Value Date/Time   CALCIUM 8.8 01/09/2018 1118   CALCIUM 8.6 12/05/2017 0840   CALCIUM 9.0 10/12/2016 0955   ALKPHOS 101 (H) 01/09/2018 1118   ALKPHOS 139 (H) 12/05/2017 0840   ALKPHOS 159 (H) 10/12/2016 0955   AST 20 01/09/2018 1118   AST 45 (H) 10/12/2016 0955   ALT 12 01/09/2018 1118   ALT 19 12/05/2017 0840   ALT 29 10/12/2016 0955   BILITOT 0.8 01/09/2018 1118   BILITOT 0.62 10/12/2016 0955     Impression and Plan: [name] is 58 year old gentleman with von Hippel-Lindau syndrome. He has multiple neuroendocrine tumors. 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 He's had multiple spinal surgeries.  His next Lutathera will be in January.  I do had set him up with another set of  scans.  We will probably do this at the end of January.  I am holding his Somatuline today given that he will have the [place] within a month.  We will see how the iron does for him.  Hopefully, the chromogranin A level will come down.  I am just happy that his quality of life seems to be doing fairly well right now.   Volanda Napoleon, MD 2/4/20196:20 PM

## 2019-12-03 LAB — CHROMOGRANIN A: Chromogranin A (ng/mL): 85.1 ng/mL (ref 0.0–101.8)

## 2019-12-03 MED FILL — GABAPENTIN 300 MG CAPSULE: 300 | 20 days supply | Qty: 120 | Fill #0

## 2019-12-06 MED FILL — CREON DR 24,000 UNITS CAP: 24000-76000 | 30 days supply | Qty: 270 | Fill #2

## 2019-12-10 MED FILL — BACLOFEN 20 MG TABS: 20 | 15 days supply | Qty: 120 | Fill #1

## 2019-12-11 ENCOUNTER — Other Ambulatory Visit: Payer: Self-pay

## 2019-12-11 ENCOUNTER — Other Ambulatory Visit (HOSPITAL_COMMUNITY): Payer: Self-pay | Admitting: Hematology & Oncology

## 2019-12-11 ENCOUNTER — Ambulatory Visit (HOSPITAL_COMMUNITY)
Admission: RE | Admit: 2019-12-11 | Discharge: 2019-12-11 | Disposition: A | Discharge status: Home / Self Care | Payer: 59 | Source: Ambulatory Visit | Attending: Hematology & Oncology | Admitting: Hematology & Oncology

## 2019-12-11 DIAGNOSIS — Z79899 Other long term (current) drug therapy: Secondary | ICD-10-CM | POA: Diagnosis not present

## 2019-12-11 DIAGNOSIS — C7A8 Other malignant neuroendocrine tumors: Secondary | ICD-10-CM

## 2019-12-11 DIAGNOSIS — C7B8 Other secondary neuroendocrine tumors: Secondary | ICD-10-CM | POA: Diagnosis not present

## 2019-12-11 LAB — COMPREHENSIVE METABOLIC PANEL
ALT: 12 U/L (ref 0–44)
AST: 18 U/L (ref 15–41)
Albumin: 3 g/dL — ABNORMAL LOW (ref 3.5–5.0)
Alkaline Phosphatase: 86 U/L (ref 38–126)
Anion gap: 9 (ref 5–15)
BUN: 20 mg/dL (ref 6–20)
CO2: 28 mmol/L (ref 22–32)
Calcium: 8.5 mg/dL — ABNORMAL LOW (ref 8.9–10.3)
Chloride: 98 mmol/L (ref 98–111)
Creatinine, Ser: 0.67 mg/dL (ref 0.61–1.24)
GFR calc Af Amer: >60 mL/min (ref >60)
GFR calc non Af Amer: >60 mL/min (ref >60)
Glucose, Bld: 218 mg/dL — ABNORMAL HIGH (ref 70–99)
Potassium: 3.8 mmol/L (ref 3.5–5.1)
Sodium: 135 mmol/L (ref 135–145)
Total Bilirubin: 0.9 mg/dL (ref 0.3–1.2)
Total Protein: 6.4 g/dL — ABNORMAL LOW (ref 6.5–8.1)

## 2019-12-11 LAB — CBC WITH DIFFERENTIAL/PLATELET
Abs Immature Granulocytes: 0.04 10*3/uL (ref 0.00–0.07)
Basophils Absolute: 0 10*3/uL (ref 0.0–0.1)
Basophils Relative: 1 %
Eosinophils Absolute: 0 10*3/uL (ref 0.0–0.5)
Eosinophils Relative: 1 %
HCT: 35.7 % — ABNORMAL LOW (ref 39.0–52.0)
Hemoglobin: 11.4 g/dL — ABNORMAL LOW (ref 13.0–17.0)
Immature Granulocytes: 1 %
Lymphocytes Relative: 8 %
Lymphs Abs: 0.5 10*3/uL — ABNORMAL LOW (ref 0.7–4.0)
MCH: 32 pg (ref 26.0–34.0)
MCHC: 31.9 g/dL (ref 30.0–36.0)
MCV: 100.3 fL — ABNORMAL HIGH (ref 80.0–100.0)
Monocytes Absolute: 0.6 10*3/uL (ref 0.1–1.0)
Monocytes Relative: 10 %
Neutro Abs: 4.8 10*3/uL (ref 1.7–7.7)
Neutrophils Relative %: 79 %
Platelets: 119 10*3/uL — ABNORMAL LOW (ref 150–400)
RBC: 3.56 MIL/uL — ABNORMAL LOW (ref 4.22–5.81)
RDW: 13.4 % (ref 11.5–15.5)
WBC: 5.9 10*3/uL (ref 4.0–10.5)
nRBC: 0 % (ref 0.0–0.2)

## 2019-12-11 LAB — GLUCOSE, CAPILLARY: Glucose-Capillary: 196 mg/dL — ABNORMAL HIGH (ref 70–99)

## 2019-12-11 MED ORDER — OCTREOTIDE ACETATE 30 MG IM KIT: 30.0000 mg | PACK | Freq: Once | INTRAMUSCULAR | Status: AC

## 2019-12-11 MED ORDER — SODIUM CHLORIDE 0.9 % IV SOLN
8.0000 mg | Freq: Once | INTRAVENOUS | Status: AC
  Administered 2019-12-11: 09:00:00 8 mg via INTRAVENOUS
  Filled 2019-12-11: qty 4

## 2019-12-11 MED ORDER — OCTREOTIDE ACETATE 500 MCG/ML IJ SOLN: 500.0000 ug | Freq: Once | INTRAMUSCULAR | Status: DC | PRN

## 2019-12-11 MED ORDER — PROCHLORPERAZINE EDISYLATE 10 MG/2ML IJ SOLN: 10.0000 mg | Freq: Four times a day (QID) | INTRAMUSCULAR | Status: DC | PRN

## 2019-12-11 MED ORDER — OCTREOTIDE ACETATE 30 MG IM KIT
PACK | INTRAMUSCULAR | Status: AC
  Administered 2019-12-11: 14:00:00 30 mg via INTRAMUSCULAR
  Filled 2019-12-11: qty 1

## 2019-12-11 MED ORDER — SODIUM CHLORIDE 0.9 % IV SOLN: 500.0000 mL | Freq: Once | INTRAVENOUS | Status: DC

## 2019-12-11 MED ORDER — AMINO ACID RADIOPROTECTANT - L-LYSINE 2.5%/L-ARGININE 2.5% IN NS
250.0000 mL/h | INTRAVENOUS | Status: AC
  Administered 2019-12-11: 250 mL/h via INTRAVENOUS
  Filled 2019-12-11: qty 1000

## 2019-12-11 MED ORDER — LUTETIUM LU 177 DOTATATE 370 MBQ/ML IV SOLN
200.0000 | Freq: Once | INTRAVENOUS | Status: AC
  Administered 2019-12-11: 11:00:00 199.6 via INTRAVENOUS

## 2019-12-11 NOTE — Progress Notes (Signed)
RADIOPHARMACEUTICALS:  [199.6] mCi Lu 177 DOTATATE  FINDINGS: Diagnosis: [Metastatic neuroendocrine tumor.]  Current Infusion: [4]  Planned Infusions: [4]  Patient reports [mild] interval symptoms following therapy.  Patient had mild RIGHT upper quadrant pain for several days following therapy.  The patient's most recent blood counts were reviewed and remains a good candidate to proceed with Lutathera. The patient was situated in an infusion suite and administered Lutathera as above. Patient will follow-up with referring oncologist for interval serum laboratories (CBC and CMP) in approximately 4 weeks.    Patient received 30 mg IM long-acting Sandostatin injection 4 hours after Lutathera effusion in the nuclear medicine department.   IMPRESSION: [Fourth and final] [id number] treatment for metastatic neuroendocrine tumor. The patient tolerated the infusion well.   Patient will follow-up with post therapy DOTATATE PET-CT scan and  consultation in approximately 4-6 weeks.

## 2019-12-17 ENCOUNTER — Other Ambulatory Visit: Payer: Self-pay | Admitting: Hematology & Oncology

## 2019-12-17 DIAGNOSIS — C7A8 Other malignant neuroendocrine tumors: Secondary | ICD-10-CM

## 2019-12-17 MED FILL — OXYBUTYNIN CHLORIDE 5 MG TA: 5 | 30 days supply | Qty: 60 | Fill #0

## 2019-12-18 MED FILL — BACLOFEN 20 MG TABS: 20 | 15 days supply | Qty: 120 | Fill #1

## 2019-12-24 DIAGNOSIS — E109 Type 1 diabetes mellitus without complications: Secondary | ICD-10-CM | POA: Diagnosis not present

## 2019-12-24 DIAGNOSIS — Z794 Long term (current) use of insulin: Secondary | ICD-10-CM | POA: Diagnosis not present

## 2019-12-24 MED FILL — SOLIFENACIN SUCCINATE 5 MG: 5 | 30 days supply | Qty: 30 | Fill #3

## 2019-12-24 MED FILL — GABAPENTIN 300 MG CAPSULE: 300 | 20 days supply | Qty: 120 | Fill #1

## 2019-12-24 MED FILL — DRONABINOL 5 MG CAP: 5 | 30 days supply | Qty: 60 | Fill #2

## 2019-12-24 MED FILL — LANTUS 100 UNITS/ML VIAL: 100 | 28 days supply | Qty: 10 | Fill #2

## 2019-12-26 ENCOUNTER — Other Ambulatory Visit: Payer: Self-pay | Admitting: Family

## 2019-12-26 DIAGNOSIS — D500 Iron deficiency anemia secondary to blood loss (chronic): Secondary | ICD-10-CM

## 2019-12-26 DIAGNOSIS — N319 Neuromuscular dysfunction of bladder, unspecified: Secondary | ICD-10-CM

## 2019-12-26 DIAGNOSIS — Q8583 Von Hippel-Lindau syndrome: Secondary | ICD-10-CM

## 2019-12-26 DIAGNOSIS — Q858 Other phakomatoses, not elsewhere classified: Secondary | ICD-10-CM

## 2019-12-26 DIAGNOSIS — C7A8 Other malignant neuroendocrine tumors: Secondary | ICD-10-CM

## 2019-12-26 MED FILL — traMADol HCL 50 MG TABS: 50 | 30 days supply | Qty: 90 | Fill #0

## 2020-01-02 ENCOUNTER — Ambulatory Visit (HOSPITAL_BASED_OUTPATIENT_CLINIC_OR_DEPARTMENT_OTHER)
Admission: RE | Admit: 2020-01-02 | Discharge: 2020-01-02 | Disposition: A | Discharge status: Home / Self Care | Payer: 59 | Source: Ambulatory Visit | Attending: Hematology & Oncology | Admitting: Hematology & Oncology

## 2020-01-02 ENCOUNTER — Inpatient Hospital Stay: Payer: 59 | Attending: Hematology & Oncology | Admitting: Hematology & Oncology

## 2020-01-02 ENCOUNTER — Inpatient Hospital Stay: Payer: 59

## 2020-01-02 ENCOUNTER — Ambulatory Visit: Payer: 59

## 2020-01-02 ENCOUNTER — Other Ambulatory Visit: Payer: Self-pay

## 2020-01-02 ENCOUNTER — Encounter (HOSPITAL_BASED_OUTPATIENT_CLINIC_OR_DEPARTMENT_OTHER): Payer: Self-pay

## 2020-01-02 ENCOUNTER — Encounter: Payer: Self-pay | Admitting: Hematology & Oncology

## 2020-01-02 ENCOUNTER — Other Ambulatory Visit: Payer: 59

## 2020-01-02 VITALS — BP 120/86 | HR 85 | Temp 97.8°F | Resp 20

## 2020-01-02 DIAGNOSIS — Z79899 Other long term (current) drug therapy: Secondary | ICD-10-CM | POA: Diagnosis not present

## 2020-01-02 DIAGNOSIS — D500 Iron deficiency anemia secondary to blood loss (chronic): Secondary | ICD-10-CM

## 2020-01-02 DIAGNOSIS — C7A00 Malignant carcinoid tumor of unspecified site: Secondary | ICD-10-CM | POA: Diagnosis not present

## 2020-01-02 DIAGNOSIS — K922 Gastrointestinal hemorrhage, unspecified: Secondary | ICD-10-CM | POA: Diagnosis not present

## 2020-01-02 DIAGNOSIS — Q858 Other phakomatoses, not elsewhere classified: Secondary | ICD-10-CM | POA: Diagnosis not present

## 2020-01-02 DIAGNOSIS — C642 Malignant neoplasm of left kidney, except renal pelvis: Secondary | ICD-10-CM | POA: Insufficient documentation

## 2020-01-02 DIAGNOSIS — C649 Malignant neoplasm of unspecified kidney, except renal pelvis: Secondary | ICD-10-CM | POA: Insufficient documentation

## 2020-01-02 DIAGNOSIS — R918 Other nonspecific abnormal finding of lung field: Secondary | ICD-10-CM | POA: Diagnosis not present

## 2020-01-02 DIAGNOSIS — C7A8 Other malignant neuroendocrine tumors: Secondary | ICD-10-CM | POA: Diagnosis not present

## 2020-01-02 DIAGNOSIS — I700 Atherosclerosis of aorta: Secondary | ICD-10-CM | POA: Diagnosis not present

## 2020-01-02 LAB — CBC WITH DIFFERENTIAL (CANCER CENTER ONLY)
Abs Immature Granulocytes: 0.01 10*3/uL (ref 0.00–0.07)
Basophils Absolute: 0 10*3/uL (ref 0.0–0.1)
Basophils Relative: 1 %
Eosinophils Absolute: 0.1 10*3/uL (ref 0.0–0.5)
Eosinophils Relative: 2 %
HCT: 40.5 % (ref 39.0–52.0)
Hemoglobin: 12.9 g/dL — ABNORMAL LOW (ref 13.0–17.0)
Immature Granulocytes: 0 %
Lymphocytes Relative: 12 %
Lymphs Abs: 0.5 10*3/uL — ABNORMAL LOW (ref 0.7–4.0)
MCH: 32.1 pg (ref 26.0–34.0)
MCHC: 31.9 g/dL (ref 30.0–36.0)
MCV: 100.7 fL — ABNORMAL HIGH (ref 80.0–100.0)
Monocytes Absolute: 0.4 10*3/uL (ref 0.1–1.0)
Monocytes Relative: 10 %
Neutro Abs: 3.1 10*3/uL (ref 1.7–7.7)
Neutrophils Relative %: 75 %
Platelet Count: 111 10*3/uL — ABNORMAL LOW (ref 150–400)
RBC: 4.02 MIL/uL — ABNORMAL LOW (ref 4.22–5.81)
RDW: 13 % (ref 11.5–15.5)
WBC Count: 4.1 10*3/uL (ref 4.0–10.5)
nRBC: 0 % (ref 0.0–0.2)

## 2020-01-02 LAB — CMP (CANCER CENTER ONLY)
ALT: 11 U/L (ref 0–44)
AST: 19 U/L (ref 15–41)
Albumin: 3.5 g/dL (ref 3.5–5.0)
Alkaline Phosphatase: 91 U/L (ref 38–126)
Anion gap: 8 (ref 5–15)
BUN: 18 mg/dL (ref 6–20)
CO2: 28 mmol/L (ref 22–32)
Calcium: 8.7 mg/dL — ABNORMAL LOW (ref 8.9–10.3)
Chloride: 99 mmol/L (ref 98–111)
Creatinine: 0.72 mg/dL (ref 0.61–1.24)
GFR, Est AFR Am: >60 mL/min (ref >60)
GFR, Estimated: >60 mL/min (ref >60)
Glucose, Bld: 145 mg/dL — ABNORMAL HIGH (ref 70–99)
Potassium: 4.2 mmol/L (ref 3.5–5.1)
Sodium: 135 mmol/L (ref 135–145)
Total Bilirubin: 0.6 mg/dL (ref 0.3–1.2)
Total Protein: 7.1 g/dL (ref 6.5–8.1)

## 2020-01-02 MED ORDER — LANREOTIDE ACETATE 120 MG/0.5ML ~~LOC~~ SOLN
120.0000 mg | Freq: Once | SUBCUTANEOUS | Status: AC
  Administered 2020-01-02: 120 mg via SUBCUTANEOUS

## 2020-01-02 MED ORDER — FENTANYL 12 MCG/HR TD PT72: MEDICATED_PATCH | TRANSDERMAL | 0 refills | Status: DC

## 2020-01-02 MED ORDER — IOHEXOL 300 MG/ML  SOLN
100.0000 mL | Freq: Once | INTRAMUSCULAR | Status: AC | PRN
  Administered 2020-01-02: 100 mL via INTRAVENOUS

## 2020-01-02 MED FILL — FENTANYL 12 MCG/HR PT72: 12 | 30 days supply | Qty: 10 | Fill #0

## 2020-01-02 NOTE — Progress Notes (Signed)
Hematology and Oncology Follow Up Visit  [name] [id number] [birth year] 59 y.o. 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  Current Therapy:  Temodar/Xeloda - start 03/17/2016 - s/p c#3 - discontinued Somatuline 120mg  SQ q month Lutathera -- cycle #4/4 on 12/19/2019 IV iron as indicated-Feraheme given on 11/20/2019 Inlyta/Keytruda -- start on 08/08/2019 -- d/c on 08/26/2019 due to toxicity     Interim History:  [name] is back for followup.  He looks quite good.  He got through the holidays without much problems.  It was fairly quiet for he and his wife.  He got his last Lutathera injection couple weeks ago.  He had a CT scan done today.  I am awaiting the results from the CT scan.  He goes for a PET scan I think in a couple weeks so we can try to follow-up on the kidney cancer.  His blood sugars are under pretty good control.  He had his suprapubic catheter replaced I think today.  His wife does this.  He did get iron I think back in December.  Back in November, his ferritin was 464 with an iron saturation of 7%.  This definitely has helped as his hemoglobin now is 12.8.  His last Chromogranin A level was 85 back in December.  Currently, I would say his performance status is ECOG 1.    Medications:  Current Outpatient Medications:  .  ACCU-CHEK SMARTVIEW test strip, , Disp: , Rfl: 10 .  baclofen (LIORESAL) 10 mg/mL SUSP, , Disp: , Rfl:  .  baclofen (LIORESAL) 20 MG tablet, , Disp: , Rfl:  .  Balsam [place]-Castor Oil (VENELEX) OINT, Apply topically as needed, Disp: 60 g, Rfl: 4 .  capecitabine (XELODA) 500 MG tablet, Take by mouth., Disp: , Rfl:  .  chlorproMAZINE (THORAZINE) 10 MG tablet, Take by mouth., Disp: , Rfl:  .  diazepam (VALIUM) 5 MG tablet, TAKE 1 TABLET BY MOUTH 3 TIMES DAILY FOR SPASMS/ANXIETY, Disp: 90  tablet, Rfl: 1 .  diphenoxylate-atropine (LOMOTIL) 2.5-0.025 MG tablet, TAKE 1 TABLET BY MOUTH 4 TIMES DAILY AS NEEDED FOR DIARRHEA OR LOOSE STOOLS, Disp: 100 tablet, Rfl: 0 .  doxazosin (CARDURA) 1 MG tablet, Take 1 tablet (1 mg total) by mouth 2 (two) times daily. (Patient taking differently: Take 1 mg by mouth 2 (two) times daily as needed (blood pressure spikes.). ), Disp: 60 tablet, Rfl: 2 .  dronabinol (MARINOL) 5 MG capsule, TAKE 1 CAPSULE BY MOUTH TWICE DAILY WITH MEALS, Disp: 60 capsule, Rfl: 3 .  gabapentin (NEURONTIN) 300 MG capsule, TAKE 2 CAPSULES BY MOUTH 3 TIMES DAILY., Disp: 180 capsule, Rfl: 6 .  HYDROcodone-acetaminophen (NORCO/VICODIN) 5-325 MG tablet, Take 1-2 tablets by mouth every 6 (six) hours as needed for moderate pain., Disp: 90 tablet, Rfl: 0 .  HYDROmorphone (DILAUDID) 4 MG tablet, Take 1 tablet (4 mg total) by mouth every 6 (six) hours as needed for severe pain., Disp: 40 tablet, Rfl: 0 .  insulin glargine (LANTUS) 100 UNIT/ML injection, Inject 31 Units into the skin daily before breakfast. , Disp: , Rfl:  .  insulin lispro (HUMALOG) 100 UNIT/ML injection, Inject 3-9 Units into the skin 3 (three) times daily before meals. SS, Disp: , Rfl:  .  lipase/protease/amylase (CREON) 12000 units CPEP capsule, Take by mouth., Disp: , Rfl:  .  midodrine (PROAMATINE) 5 MG tablet,  TKAE 1/2 TABLET BY MOUTH TWICE A DAY AS NEEDED, Disp: 60 tablet, Rfl: 6 .  NON FORMULARY, Take by mouth., Disp: , Rfl:  .  oxybutynin (DITROPAN) 5 MG tablet, 5 mg 2 (two) times daily. , Disp: , Rfl: 3 .  promethazine (PHENERGAN) 12.5 MG tablet, Take 12.5 mg by mouth every 6 (six) hours as needed for nausea. , Disp: , Rfl:  .  tiZANidine (ZANAFLEX) 4 MG tablet, TAKE 2 TABLETS BY MOUTH EVERY 6 HOURS AS NEEDED FOR PAIN, Disp: 30 tablet, Rfl: 3 .  traMADol (ULTRAM) 50 MG tablet, TAKE 1 TABLET BY MOUTH 3 TIMES DAILY, Disp: 90 tablet, Rfl: 2  Allergies:  Allergies  Allergen Reactions  . Citalopram Diarrhea  .  Ciprocin-Fluocin-Procin [Fluocinolone Acetonide] Rash    Pt states this happened with IV Cipro. ABLE TO TAKE PO CIPRO.  . Other Rash    IV-CIPRO    Past Medical History, Surgical history, Social history, and Family History were reviewed and updated.  Review of Systems: Review of Systems  Constitutional: Negative.   HENT: Negative.   Eyes: Negative.   Respiratory: Negative.   Cardiovascular: Negative.   Gastrointestinal: Positive for diarrhea and nausea.  Genitourinary: Positive for frequency.  Musculoskeletal: Positive for myalgias and neck pain.  Skin: Negative.   Neurological: Negative.   Endo/Heme/Allergies: Negative.   Psychiatric/Behavioral: Negative.      Physical Exam:  oral temperature is 97.5 F (36.4 C) (abnormal). His blood pressure is 82/54 (abnormal) and his pulse is 94. His respiration is 18 and oxygen saturation is 99%.   Physical Exam Vitals reviewed.  HENT:     Head: Normocephalic and atraumatic.  Eyes:     Pupils: Pupils are equal, round, and reactive to light.  Cardiovascular:     Rate and Rhythm: Normal rate and regular rhythm.     Heart sounds: Normal heart sounds.  Pulmonary:     Effort: Pulmonary effort is normal.     Breath sounds: Normal breath sounds.  Abdominal:     General: Bowel sounds are normal.     Palpations: Abdomen is soft.     Comments: He has a suprapubic catheter in place  Musculoskeletal:        General: No tenderness or deformity. Normal range of motion.     Cervical back: Normal range of motion.     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  Skin:    General: Skin is warm and dry.     Findings: No erythema or rash.  Neurological:     Mental Status: He is alert and oriented to person, place, and time.  Psychiatric:        Behavior: Behavior normal.        Thought Content: Thought content normal.        Judgment: Judgment normal.   .  Lab Results    Component Value Date   WBC 11.3 (H) 01/09/2018   HGB 13.8 12/05/2017   HCT 43.8 01/09/2018   MCV 86.4 01/09/2018   PLT 288 01/09/2018     Chemistry      Component Value Date/Time   NA 137 01/09/2018 1118   NA 137 12/05/2017 0840   NA 138 10/12/2016 0955   K 3.3 01/09/2018 1118   K 3.4 12/05/2017 0840   K 3.3 (L) 10/12/2016 0955   CL 104 01/09/2018 1118   CL 101 12/05/2017 0840   CO2 30  01/09/2018 1118   CO2 29 12/05/2017 0840   CO2 25 10/12/2016 0955   BUN 14 01/09/2018 1118   BUN 16 12/05/2017 0840   BUN 22.0 10/12/2016 0955   CREATININE 0.8 12/05/2017 0840   CREATININE 0.8 10/12/2016 0955      Component Value Date/Time   CALCIUM 8.8 01/09/2018 1118   CALCIUM 8.6 12/05/2017 0840   CALCIUM 9.0 10/12/2016 0955   ALKPHOS 101 (H) 01/09/2018 1118   ALKPHOS 139 (H) 12/05/2017 0840   ALKPHOS 159 (H) 10/12/2016 0955   AST 20 01/09/2018 1118   AST 45 (H) 10/12/2016 0955   ALT 12 01/09/2018 1118   ALT 19 12/05/2017 0840   ALT 29 10/12/2016 0955   BILITOT 0.8 01/09/2018 1118   BILITOT 0.62 10/12/2016 0955     Impression and Plan:  [name] is 59 year old gentleman with von Hippel-Lindau syndrome. He has multiple neuroendocrine tumors. 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 He's had multiple spinal surgeries.  Hopefully, we will see that his neuroendocrine tumors are improved.  If not, what we might think about is doing Somatuline every 2 weeks.  A study came out which showed that increasing the frequency of Somatuline to every 2 weeks does seem to have a toxic effect on these neuroendocrine tumors.  We will see what the PET scan shows with respect to his renal cell carcinoma.  Hopefully, this is not growing or progressing.  This is quite complicated.  Has been over 35 minutes with him today.  Having to cancers to deal with is certainly not easy.  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  His wife is really a blessing to him as she is incredibly  dedicated to making sure that he is doing well and that he gets the care that he deserves.  Volanda Napoleon, MD 2/4/20196:20 PM

## 2020-01-02 NOTE — Patient Instructions (Signed)
Lanreotide injection What is this medicine? LANREOTIDE (lan REE oh tide) is used to reduce blood levels of growth hormone in patients with a condition called acromegaly. It also works to slow or stop tumor growth in patients with neuroendocrine tumors and treat carcinoid syndrome. This medicine may be used for other purposes; ask your health care provider or pharmacist if you have questions. COMMON BRAND NAME(S): Somatuline [place] What should I tell my health care provider before I take this medicine? They need to know if you have any of these conditions:  diabetes  gallbladder disease  heart disease  kidney disease  liver disease  thyroid disease  an unusual or allergic reaction to lanreotide, other medicines, foods, dyes, or preservatives  pregnant or trying to get pregnant  breast-feeding How should I use this medicine? This medicine is for injection under the skin. It is given by a health care professional in a hospital or clinic setting. Contact your pediatrician or health care professional regarding the use of this medicine in children. Special care may be needed. Overdosage: If you think you have taken too much of this medicine contact a poison control center or emergency room at once. NOTE: This medicine is only for you. Do not share this medicine with others. What if I miss a dose? It is important not to miss your dose. Call your doctor or health care professional if you are unable to keep an appointment. What may interact with this medicine? This medicine may interact with the following medications:  bromocriptine  cyclosporine  certain medicines for blood pressure, heart disease, irregular heart beat  certain medicines for diabetes  quinidine  terfenadine This list may not describe all possible interactions. Give your health care provider a list of all the medicines, herbs, non-prescription drugs, or dietary supplements you use. Also tell them if you smoke,  drink alcohol, or use illegal drugs. Some items may interact with your medicine. What should I watch for while using this medicine? Tell your doctor or healthcare professional if your symptoms do not start to get better or if they get worse. Visit your doctor or health care professional for regular checks on your progress. Your condition will be monitored carefully while you are receiving this medicine. This medicine may increase blood sugar. Ask your healthcare provider if changes in diet or medicines are needed if you have diabetes. You may need blood work done while you are taking this medicine. Women should inform their doctor if they wish to become pregnant or think they might be pregnant. There is a potential for serious side effects to an unborn child. Talk to your health care professional or pharmacist for more information. Do not breast-feed an infant while taking this medicine or for 6 months after stopping it. This medicine has caused ovarian failure in some women. This medicine may interfere with the ability to have a child. Talk with your doctor or health care professional if you are concerned about your fertility. What side effects may I notice from receiving this medicine? Side effects that you should report to your doctor or health care professional as soon as possible:  allergic reactions like skin rash, itching or hives, swelling of the face, lips, or tongue  increased blood pressure  severe stomach pain  signs and symptoms of hgh blood sugar such as being more thirsty or hungry or having to urinate more than normal. You may also feel very tired or have blurry vision.  signs and symptoms of low blood   sugar such as feeling anxious; confusion; dizziness; increased hunger; unusually weak or tired; sweating; shakiness; cold; irritable; headache; blurred vision; fast heartbeat; loss of consciousness  unusually slow heartbeat Side effects that usually do not require medical  attention (report to your doctor or health care professional if they continue or are bothersome):  constipation  diarrhea  dizziness  headache  muscle pain  muscle spasms  nausea  pain, redness, or irritation at site where injected This list may not describe all possible side effects. Call your doctor for medical advice about side effects. You may report side effects to FDA at 1-800-FDA-1088. Where should I keep my medicine? This drug is given in a hospital or clinic and will not be stored at home. NOTE: This sheet is a summary. It may not cover all possible information. If you have questions about this medicine, talk to your doctor, pharmacist, or health care provider.  2020 Elsevier/Gold Standard (2018-08-31 09:13:08)  

## 2020-01-03 ENCOUNTER — Encounter: Payer: Self-pay | Admitting: Hematology & Oncology

## 2020-01-03 LAB — FERRITIN: Ferritin: 625 ng/mL — ABNORMAL HIGH (ref 24–336)

## 2020-01-03 LAB — IRON AND TIBC
Iron: 90 ug/dL (ref 42–163)
Saturation Ratios: 39 % (ref 20–55)
TIBC: 229 ug/dL (ref 202–409)
UIBC: 139 ug/dL (ref 117–376)

## 2020-01-03 LAB — CHROMOGRANIN A: Chromogranin A (ng/mL): 59.2 ng/mL (ref 0.0–101.8)

## 2020-01-07 MED FILL — CREON DR 24,000 UNITS CAP: 24000-76000 | 30 days supply | Qty: 270 | Fill #3

## 2020-01-07 MED FILL — tiZANidine HCL 4 MG TABS: 4 | 4 days supply | Qty: 30 | Fill #2

## 2020-01-09 ENCOUNTER — Ambulatory Visit: Payer: 59 | Admitting: Hematology & Oncology

## 2020-01-10 ENCOUNTER — Inpatient Hospital Stay: Payer: 59 | Admitting: Hematology & Oncology

## 2020-01-14 MED FILL — BACLOFEN 20 MG TABS: 20 | 15 days supply | Qty: 120 | Fill #2

## 2020-01-14 MED FILL — GABAPENTIN 300 MG CAPSULE: 300 | 20 days supply | Qty: 120 | Fill #2

## 2020-01-17 ENCOUNTER — Inpatient Hospital Stay: Payer: 59 | Admitting: Hematology & Oncology

## 2020-01-17 DIAGNOSIS — Z760 Encounter for issue of repeat prescription: Secondary | ICD-10-CM | POA: Diagnosis not present

## 2020-01-17 MED FILL — BASAGLAR 100 UNIT/ML KWIKPE: 100 | 84 days supply | Qty: 21 | Fill #0

## 2020-01-18 MED FILL — OXYBUTYNIN CHLORIDE 5 MG TA: 5 | 30 days supply | Qty: 60 | Fill #1

## 2020-01-21 MED FILL — SOLIFENACIN SUCCINATE 5 MG: 5 | 30 days supply | Qty: 30 | Fill #4

## 2020-01-22 ENCOUNTER — Ambulatory Visit (HOSPITAL_COMMUNITY)
Admission: RE | Admit: 2020-01-22 | Discharge: 2020-01-22 | Disposition: A | Discharge status: Home / Self Care | Payer: 59 | Source: Ambulatory Visit | Attending: Hematology & Oncology | Admitting: Hematology & Oncology

## 2020-01-22 ENCOUNTER — Other Ambulatory Visit: Payer: Self-pay

## 2020-01-22 DIAGNOSIS — C7A00 Malignant carcinoid tumor of unspecified site: Secondary | ICD-10-CM | POA: Diagnosis not present

## 2020-01-22 DIAGNOSIS — C801 Malignant (primary) neoplasm, unspecified: Secondary | ICD-10-CM | POA: Diagnosis not present

## 2020-01-22 DIAGNOSIS — K7689 Other specified diseases of liver: Secondary | ICD-10-CM | POA: Diagnosis not present

## 2020-01-22 DIAGNOSIS — C787 Secondary malignant neoplasm of liver and intrahepatic bile duct: Secondary | ICD-10-CM | POA: Diagnosis not present

## 2020-01-22 DIAGNOSIS — C649 Malignant neoplasm of unspecified kidney, except renal pelvis: Secondary | ICD-10-CM | POA: Diagnosis not present

## 2020-01-22 DIAGNOSIS — C7A8 Other malignant neuroendocrine tumors: Secondary | ICD-10-CM | POA: Diagnosis not present

## 2020-01-22 DIAGNOSIS — N2889 Other specified disorders of kidney and ureter: Secondary | ICD-10-CM | POA: Diagnosis not present

## 2020-01-22 MED ORDER — GALLIUM GA 68 DOTATATE IV KIT
4.3000 | PACK | Freq: Once | INTRAVENOUS | Status: AC
  Administered 2020-01-22: 4.3 via INTRAVENOUS

## 2020-01-22 NOTE — Consult Note (Signed)
Patient was interviewed over the phone for COVID safety and inconvenience.  Patient's wife [name] was  interviewed was informed of results of most recent DOTATATE PET scan.    Chief Complaint: Patient with metastatic neuroendocrine tumor following complettion Peptide receptor radiotherapy (PRRT) with [id number] DOTATATE (Lutathera).  Referring Physician(s):    Patient Status: [name] - Out-pt  History of Present Illness: [name]. is a 59 y.o. 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  Patient status post pancreatectomy in 2009 with partial wedge resection of LEFT hepatic lobe.  Partial nephrectomy on the RIGHT for renal cell carcinomas.  Patient status post multiple surgeries of the CNS  hemangioblastomas (under care of neurosurgeon at [facility]).   Patient presents for management of well differentiated neuroendocrine tumors to the liver.  Patient has undergone on 5  yttrium 90 microspheres therapies to the liver starting in 2011.  The last therapy was to the RIGHT hepatic lobe in August 2017.  Patient has had 3 therapies to the RIGHT hepatic lobe.  While the neuroendocrine tumor disease burden is much improved from 2011 there is been slow enlargement of several of the residual tumors within the RIGHT hepatic lobe.  These tumors are demonstrated to be avid for the DOTATATE gallium 68 radiotracer consistent with well differentiated neuroendocrine tumors.   Patient's chromogranin A is mildly elevated 10 after several years of less than 5.    Patient's reports mild symptoms which may be attributed to the neuroendocrine tumor including gastric bloating, diarrhea, and ill-defined abdominal pain.   Patient receives Samatuline 120 mg subcu every month.   Patient received peptide receptor radiotherapy from July 2022 January 2021.  Four treatments of [id number]  millicuries LU  [id number] Dotatate.     Past Medical History:  Diagnosis Date  . Anemia 08/19/2014  . 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 Diabetes mellitus without complication ([place])   . Goals of care, counseling/discussion 06/13/2019  . Headache(784.0)    neuropathic pain usually behind eyes related to blood pressure  . HTN (hypertension) 08/19/2014  . Iron deficiency anemia, unspecified 07/10/2014  . 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 Orthostatic hypotension 08/19/2014  . Renal cell cancer, left ([place]) 06/13/2019  . Transfusion history    '09 .   Marland Kitchen Tumor of optic nerve    right -"stable"  . Von Hippel-Lindau syndrome ([place])    genetic disease that causes tumors    Past Surgical History:  Procedure Laterality Date  . BACK SURGERY     '91- T#-T5 "tumor resection"-benign  . CERVICAL SPINE SURGERY     '07- resection tumor C5-C7-"benign"  . 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 COLONOSCOPY WITH PROPOFOL N/A 08/20/2014   Procedure: COLONOSCOPY WITH PROPOFOL;  Surgeon: Cleotis Nipper, MD;  Location: [facility];  Service: Endoscopy;  Laterality: N/A;  . CRANIOTOMY FOR TUMOR     medulla tumor "benign tumor"-salvage own bone for closure  . CYSTOSCOPY WITH LITHOLAPAXY N/A 02/04/2014   Procedure: CYSTOSCOPY WITH LITHOLAPAXY;  Surgeon: Franchot Gallo, MD;  Location: [facility];  Service: Urology;  Laterality: N/A;  . ENUCLEATION Left    '05 with prosthesis  . FLEXIBLE SIGMOIDOSCOPY N/A 09/26/2015   Procedure: FLEXIBLE SIGMOIDOSCOPY;  Surgeon: Laurence Spates, MD;  Location: [facility];  Service: Endoscopy;  Laterality: N/A;  .  INSERTION OF SUPRAPUBIC CATHETER N/A 02/04/2014   Procedure: INSERTION OF SUPRAPUBIC CATHETER;  Surgeon: Franchot Gallo, MD;  Location: [facility];  Service: Urology;  Laterality: N/A;  . IR GENERIC HISTORICAL  07/15/2016   IR ANGIOGRAM VISCERAL SELECTIVE 07/15/2016 Arne Cleveland, MD [facility]    . IR GENERIC HISTORICAL  07/15/2016   IR EMBO ARTERIAL NOT HEMORR HEMANG INC GUIDE ROADMAPPING 07/15/2016 Arne Cleveland, MD [facility]  . IR GENERIC HISTORICAL  07/15/2016   IR ANGIOGRAM SELECTIVE EACH ADDITIONAL VESSEL 07/15/2016 Arne Cleveland, MD [facility]  . IR GENERIC HISTORICAL  07/15/2016   IR ANGIOGRAM SELECTIVE EACH ADDITIONAL VESSEL 07/15/2016 Arne Cleveland, MD [facility]  . IR GENERIC HISTORICAL  07/15/2016   IR ANGIOGRAM SELECTIVE EACH ADDITIONAL VESSEL 07/15/2016 Arne Cleveland, MD [facility]  . IR GENERIC HISTORICAL  07/15/2016   IR ANGIOGRAM SELECTIVE EACH ADDITIONAL VESSEL 07/15/2016 Arne Cleveland, MD [facility]  . IR GENERIC HISTORICAL  07/15/2016   IR US GUIDE VASC ACCESS RIGHT 07/15/2016 Arne Cleveland, MD [facility]  . IR GENERIC HISTORICAL  07/15/2016   IR ANGIOGRAM VISCERAL SELECTIVE 07/15/2016 Arne Cleveland, MD [facility]  . IR GENERIC HISTORICAL  07/15/2016   IR ANGIOGRAM SELECTIVE EACH ADDITIONAL VESSEL 07/15/2016 Arne Cleveland, MD [facility]  . IR GENERIC HISTORICAL  07/28/2016   IR ANGIOGRAM VISCERAL SELECTIVE 07/28/2016 Arne Cleveland, MD [facility]  . IR GENERIC HISTORICAL  07/28/2016   IR US GUIDE VASC ACCESS RIGHT 07/28/2016 Arne Cleveland, MD [facility]  . IR GENERIC HISTORICAL  07/28/2016   IR EMBO TUMOR ORGAN ISCHEMIA INFARCT INC GUIDE ROADMAPPING 07/28/2016 Arne Cleveland, MD [facility]  . IR GENERIC HISTORICAL  06/30/2016   IR RADIOLOGIST EVAL & MGMT 06/30/2016 Greggory Keen, MD [facility]  . MUSCLE FLAP MYOCUTANEOUS / FASCIOCUTANEOUS OF TRUNK Left    '01  . PANCREAS SURGERY     '09- with partial liver resection/ pancreas "Whipple"  . PARTIAL NEPHRECTOMY Right    '09- cancer  . PERIPHERALLY INSERTED CENTRAL CATHETER INSERTION     past hx.  . RECONSTRUCTION BREAST W/ TRAM FLAP      Allergies: Ciprofloxacin, Citalopram, and Ciprocin-fluocin-procin [fluocinolone acetonide]  Medications: Prior to Admission medications    Medication Sig Start Date End Date Taking? Authorizing Provider  ACCU-CHEK SMARTVIEW test strip  05/10/16   [provider]  Ascorbic Acid (VITAMIN C) 1000 MG tablet Take 1,000 mg by mouth daily.    [provider]  baclofen (LIORESAL) 20 MG tablet TAKE 1 TO 2 TABLETS BY MOUTH 4 TIMES A DAY 11/05/19   Volanda Napoleon, MD  cholecalciferol (VITAMIN D3) 25 MCG (1000 UT) tablet Take 1,000 Units by mouth daily.    [provider]  CRANBERRY CONCENTRATE PO Take 1 capsule by mouth 3 (three) times daily.    [provider]  CREON 24000-76000 units CPEP TAKE 3 CAPSULES BY MOUTH THREE TIMES DAILY WITH MEALS 10/12/19   Ennever, Rudell Cobb, MD  diazepam (VALIUM) 5 MG tablet TAKE 1 TABLET BY MOUTH 3 TIMES DAILY FOR SPASMS/ANXIETY 10/12/19   Volanda Napoleon, MD  diphenoxylate-atropine (LOMOTIL) 2.5-0.025 MG tablet TAKE 1 TABLET BY MOUTH 4 TIMES DAILY AS NEEDED FOR DIARRHEA OR LOOSE STOOLS Patient not taking: No sig reported 02/26/19   Volanda Napoleon, MD  doxazosin (CARDURA) 2 MG tablet Take 1 tablet (2 mg total) by mouth 2 (two) times daily. 08/14/19   Volanda Napoleon, MD  dronabinol (MARINOL) 5 MG capsule TAKE 1  CAPSULE BY MOUTH TWICE DAILY WITH MEALS 10/19/19   Volanda Napoleon, MD  fentaNYL (DURAGESIC) 12 MCG/HR APPLY 1 PATCH ON THE SKIN EVERY 3 DAYS 01/02/20   Volanda Napoleon, MD  gabapentin (NEURONTIN) 300 MG capsule Take 2 capsules (600 mg total) by mouth 3 (three) times daily. 07/31/19   Volanda Napoleon, MD  Glucagon (BAQSIMI ONE PACK) 3 MG/DOSE POWD Place 1 spray into the nose daily as needed (low glucose).    [provider]  HYDROcodone-acetaminophen (NORCO/VICODIN) 5-325 MG tablet Take 1-2 tablets by mouth every 6 (six) hours as needed for moderate pain. 08/04/18   Volanda Napoleon, MD  HYDROmorphone (DILAUDID) 4 MG tablet Take 1 tablet (4 mg total) by mouth every 6 (six) hours as needed for severe pain. 10/10/19   Volanda Napoleon, MD  insulin glargine (LANTUS)  100 UNIT/ML injection Inject 25 Units into the skin daily before breakfast.     [provider]  insulin lispro (HUMALOG) 100 UNIT/ML injection Inject 3-9 Units into the skin 3 (three) times daily before meals. SS    [provider]  midodrine (PROAMATINE) 5 MG tablet TAKE 0.5 TABLET BY MOUTH TWICE A DAY AS NEEDED Patient taking differently: Take 2.5 mg by mouth 2 (two) times daily as needed (blood pressure).  10/11/18   Volanda Napoleon, MD  oxybutynin (DITROPAN) 5 MG tablet TAKE 1 TABLET BY MOUTH TWICE DAILY FOR BLADDER SPASMS IF NEEDED 12/17/19   Volanda Napoleon, MD  promethazine (PHENERGAN) 12.5 MG tablet Take 1 tablet (12.5 mg total) by mouth every 6 (six) hours as needed for nausea. 05/02/19   Volanda Napoleon, MD  solifenacin (VESICARE) 5 MG tablet TAKE 1 TABLET BY MOUTH ONCE DAILY 09/24/19   Volanda Napoleon, MD  tiZANidine (ZANAFLEX) 4 MG tablet Take 2 tablets (8 mg total) by mouth every 6 (six) hours as needed. For pain 06/29/19   Volanda Napoleon, MD  traMADol (ULTRAM) 50 MG tablet TAKE 1 TABLET BY MOUTH 3 TIMES DAILY 12/26/19   [place], Holli Humbles, NP  prochlorperazine (COMPAZINE) 10 MG tablet Take 1 tablet (10 mg total) by mouth every 6 (six) hours as needed (Nausea or vomiting). 08/09/19 12/26/19  Volanda Napoleon, MD     Family History  Problem Relation Age of Onset  . Heart failure Father     Social History   Socioeconomic History  . Marital status: Married    Spouse name: Not on file  . Number of children: Not on file  . Years of education: Not on file  . Highest education level: Not on file  Occupational History  . Not on file  Tobacco Use  . Smoking status: Former Smoker    Packs/day: 0.50    Years: 6.00    Pack years: 3.00    Types: Cigarettes    Start date: 01/15/1999    Quit date: 09/23/2008    Years since quitting: 11.3  . Smokeless tobacco: Never Used  . Tobacco comment: quit 2008  Substance and Sexual Activity  . Alcohol use: Yes     Alcohol/week: 0.0 standard drinks    Comment: only rare occasions  . Drug use: No  . Sexual activity: Never  Other Topics Concern  . Not on file  Social History Narrative  . Not on file   Social Determinants of Health   Financial Resource Strain:   . Difficulty of Paying Living Expenses: Not on file  Food Insecurity:   .  Worried About Charity fundraiser in the Last Year: Not on file  . Ran Out of Food in the Last Year: Not on file  Transportation Needs:   . Lack of Transportation (Medical): Not on file  . Lack of Transportation (Non-Medical): Not on file  Physical Activity:   . Days of Exercise per Week: Not on file  . Minutes of Exercise per Session: Not on file  Stress:   . Feeling of Stress : Not on file  Social Connections:   . Frequency of Communication with Friends and Family: Not on file  . Frequency of Social Gatherings with Friends and Family: Not on file  . Attends Religious Services: Not on file  . Active Member of Clubs or Organizations: Not on file  . Attends Archivist Meetings: Not on file  . Marital Status: Not on file      ECOG Status: 1 - Symptomatic but completely ambulatory  (in so much as patient is parapalegic) Review of Systems: A 12 point ROS discussed and pertinent positives are indicated in the HPI above.  All other systems are negative.  Review of Systems    Vital Signs: There were no vitals taken for this visit.  Physical Exam  Imaging: NM PET (NETSPOT GA 64 DOTATATE) SKULL BASE TO MID THIGH  Result Date: 01/22/2020 CLINICAL DATA:  Well differentiated neuroendocrine tumor. Patient status post peptide receptor radiotherapy (Lu Lu 177 Dotatate. EXAM: NUCLEAR MEDICINE PET SKULL BASE TO THIGH TECHNIQUE: Four point mCi Ga 68 DOTATATE was injected intravenously. Full-ring PET imaging was performed from the skull base to thigh after the radiotracer. CT data was obtained and used for attenuation correction and anatomic localization.  COMPARISON:  CT 01/02/2020, PET-CT 01/05/2019 FINDINGS: NECK No radiotracer activity in neck lymph nodes. Incidental CT findings: None CHEST No radiotracer accumulation within mediastinal or hilar lymph nodes. Pulmonary nodule in the anterior aspect of the RIGHT middle lobe measuring 9 mm not present on comparison DOTATATE PET scan from 01/05/2019 but noted on most recent CT scan 01/02/2020. Several nodules in the anterior aspect of the LEFT upper lobe measure up to 2 cm in conglomerate (image 25/8). The RIGHT lobe nodule does not have associated radiotracer activity. The anterior LEFT upper lobe nodule along the pleural surface have mild radiotracer activity SUV max equal 2.4. Incidental CT finding:None ABDOMEN/PELVIS Again demonstrated multiple lesions in the liver which are avid for the somatostatin specific radiotracer. No new lesions are present compared to prior Dotatate PET scan. Example lesion anterior RIGHT hepatic lobe with SUV max equal 40 is decreased from SUV max equal 73.8 (image 99. Smaller subcapsular low lesion in the posterior RIGHT hepatic lobe with SUV max equal 30 compared SUV max equal 36. Larger central RIGHT hepatic lobe lesion with SUV max equal 62 compared SUV max equal 58. No new lesions. No hypermetabolic activity in the abdominal pelvic mesenteries. No abnormal activity associated with the small bowel. No abnormal activity associated with any residual pancreas. Patient status post Whipple procedure The large mass exophytic from the lower pole of the LEFT kidney has very mild radiotracer activity (SUV max equal 3.7. Physiologic activity noted in the liver, spleen, adrenal glands and kidneys. Incidental CT findings:None SKELETON No focal activity within the skeleton to suggest well differentiated neuroendocrine tumor metastasis. Particular attention directed to the proximal RIGHT femur where lesion of concern was identified on comparison MRI. Incidental CT findings:None IMPRESSION: 1. No  evidence well differentiated neuroendocrine tumor disease progression 2. Persistent  multifocal hepatic metastasis. No evidence disease progression. Several lesions are decreased in radiotracer activity compared to DOTATATE PET scan 1 year prior consistent positive response. 3. No new or progressive neuroendocrine tumor in the abdomen pelvis. 4. New pulmonary nodules within LEFT and RIGHT lung. Absent or very low radiotracer activity associated these nodules. Favor nodules represent to renal cell carcinoma metastasis rather than neuroendocrine tumor metastasis. Electronically Signed   By: Suzy Bouchard M.D.   On: 01/22/2020 15:01    Labs:  CBC: Recent Labs    10/26/19 0756 11/28/19 0752 12/11/19 0817 01/02/20 1147  WBC 6.1 5.0 5.9 4.1  HGB 11.7* 11.9* 11.4* 12.9*  HCT 37.8* 37.0* 35.7* 40.5  PLT 173 108* 119* 111*    COAGS: Recent Labs    07/04/19 1219 10/26/19 0756  INR 1.2 1.1    BMP: Recent Labs    10/16/19 0823 11/28/19 0752 12/11/19 0817 01/02/20 1147  NA 137 134* 135 135  K 3.6 4.1 3.8 4.2  CL 107 96* 98 99  CO2 24 30 28 28   GLUCOSE 125* 269* 218* 145*  BUN 20 19 20 18   CALCIUM 8.2* 8.8* 8.5* 8.7*  CREATININE 0.69 0.71 0.67 0.72  GFRNONAA >60 >60 >60 >60  GFRAA >60 >60 >60 >60    LIVER FUNCTION TESTS: Recent Labs    10/16/19 0823 11/28/19 0752 12/11/19 0817 01/02/20 1147  BILITOT 1.2 0.6 0.9 0.6  AST 17 15 18 19   ALT 14 10 12 11   ALKPHOS 105 93 86 91  PROT 6.7 6.8 6.4* 7.1  ALBUMIN 3.0* 3.3* 3.0* 3.5    TUMOR MARKERS: Chromagranin reduced to 59 from 106 in 10/20 Assessment and Plan:  1. Patient completed peptide receptor radiotherapy with minimal toxicity.  Patient received 4 doses of A999333 millicuries Lu [id number] Dotatate (treatment from July 2020 08 December 2019).  Patient has mildly decreased platelets at 111K today.  White blood cell count normal.  Patient has mild chronic anemia.  Liver function in renal function normal.  2. Patient had a positive  response to initial six-month therapy on follow-up DOTATATE PET scan performed 01/22/2020.  No new lesions in liver.  No new metastatic disease on whole-body scan.  Several lesion in liver do have reduced radiotracer activity.  Of note primary goal of therapy is reduction in disease progression.  Peptide receptor radiotherapycan continued delayed toxic effect on tumor cells forseveral months followingfinal treatment.   3. Reduction chromogranin A levels a compared to time of initiation of treatment.  4. Recommend continuing patient on Sandostatin injections.  5. New lesions in the lung are favored metastatic renal cell carcinoma.  ]   Thank you for this interesting consult.  I greatly enjoyed meeting [name]. and look forward to participating in their care.  A copy of this report was sent to the requesting provider on this date.  Electronically Signed: Rennis Golden, MD 01/22/2020, 3:20 PM   I spent a total of    15 Minutes in over the phone   clinical consultation, greater than 50% of which was counseling/coordinating care for metastatic neuroendocrine tumor.

## 2020-01-24 MED FILL — traMADol HCL 50 MG TABS: 50 | 30 days supply | Qty: 90 | Fill #1

## 2020-01-24 MED FILL — DRONABINOL 5 MG CAP: 5 | 30 days supply | Qty: 60 | Fill #3

## 2020-01-29 ENCOUNTER — Other Ambulatory Visit: Payer: Self-pay | Admitting: Hematology & Oncology

## 2020-01-29 ENCOUNTER — Other Ambulatory Visit: Payer: Self-pay | Admitting: *Deleted

## 2020-01-29 DIAGNOSIS — C7A8 Other malignant neuroendocrine tumors: Secondary | ICD-10-CM

## 2020-01-29 DIAGNOSIS — N390 Urinary tract infection, site not specified: Secondary | ICD-10-CM

## 2020-01-29 DIAGNOSIS — Z23 Encounter for immunization: Secondary | ICD-10-CM

## 2020-01-29 DIAGNOSIS — T83510S Infection and inflammatory reaction due to cystostomy catheter, sequela: Secondary | ICD-10-CM

## 2020-01-29 MED ORDER — HYDROMORPHONE HCL 4 MG PO TABS: 4.0000 mg | ORAL_TABLET | Freq: Four times a day (QID) | ORAL | 0 refills | Status: AC | PRN

## 2020-01-29 MED ORDER — HYDROCODONE-ACETAMINOPHEN 5-325 MG PO TABS: 1.0000 | ORAL_TABLET | Freq: Four times a day (QID) | ORAL | 0 refills | Status: DC | PRN

## 2020-01-29 MED FILL — HYDROmorphone HCL 4 MG TABS: 4 | 15 days supply | Qty: 60 | Fill #0

## 2020-01-30 MED ORDER — HYDROCODONE-ACETAMINOPHEN 5-325 MG PO TABS: 1.0000 | ORAL_TABLET | Freq: Four times a day (QID) | ORAL | 0 refills | Status: AC | PRN

## 2020-01-30 MED FILL — HYDROCODON-APAP 5-325: 5-325 | 12 days supply | Qty: 90 | Fill #0

## 2020-01-31 ENCOUNTER — Encounter: Payer: Self-pay | Admitting: Hematology & Oncology

## 2020-01-31 ENCOUNTER — Inpatient Hospital Stay: Payer: 59

## 2020-01-31 ENCOUNTER — Inpatient Hospital Stay: Payer: 59 | Attending: Hematology & Oncology | Admitting: Hematology & Oncology

## 2020-01-31 ENCOUNTER — Other Ambulatory Visit: Payer: Self-pay

## 2020-01-31 VITALS — BP 142/86 | HR 76 | Temp 97.5°F | Resp 19

## 2020-01-31 DIAGNOSIS — C649 Malignant neoplasm of unspecified kidney, except renal pelvis: Secondary | ICD-10-CM | POA: Insufficient documentation

## 2020-01-31 DIAGNOSIS — C642 Malignant neoplasm of left kidney, except renal pelvis: Secondary | ICD-10-CM | POA: Diagnosis not present

## 2020-01-31 DIAGNOSIS — C7800 Secondary malignant neoplasm of unspecified lung: Secondary | ICD-10-CM | POA: Insufficient documentation

## 2020-01-31 DIAGNOSIS — D500 Iron deficiency anemia secondary to blood loss (chronic): Secondary | ICD-10-CM | POA: Diagnosis not present

## 2020-01-31 DIAGNOSIS — Z794 Long term (current) use of insulin: Secondary | ICD-10-CM | POA: Insufficient documentation

## 2020-01-31 DIAGNOSIS — C7A8 Other malignant neuroendocrine tumors: Secondary | ICD-10-CM

## 2020-01-31 DIAGNOSIS — Z79899 Other long term (current) drug therapy: Secondary | ICD-10-CM | POA: Insufficient documentation

## 2020-01-31 DIAGNOSIS — E119 Type 2 diabetes mellitus without complications: Secondary | ICD-10-CM | POA: Insufficient documentation

## 2020-01-31 DIAGNOSIS — R918 Other nonspecific abnormal finding of lung field: Secondary | ICD-10-CM | POA: Diagnosis not present

## 2020-01-31 LAB — CBC WITH DIFFERENTIAL (CANCER CENTER ONLY)
Abs Immature Granulocytes: 0.02 10*3/uL (ref 0.00–0.07)
Basophils Absolute: 0 10*3/uL (ref 0.0–0.1)
Basophils Relative: 1 %
Eosinophils Absolute: 0.1 10*3/uL (ref 0.0–0.5)
Eosinophils Relative: 2 %
HCT: 32.2 % — ABNORMAL LOW (ref 39.0–52.0)
Hemoglobin: 10.9 g/dL — ABNORMAL LOW (ref 13.0–17.0)
Immature Granulocytes: 0 %
Lymphocytes Relative: 11 %
Lymphs Abs: 0.5 10*3/uL — ABNORMAL LOW (ref 0.7–4.0)
MCH: 33.1 pg (ref 26.0–34.0)
MCHC: 33.9 g/dL (ref 30.0–36.0)
MCV: 97.9 fL (ref 80.0–100.0)
Monocytes Absolute: 0.4 10*3/uL (ref 0.1–1.0)
Monocytes Relative: 9 %
Neutro Abs: 3.9 10*3/uL (ref 1.7–7.7)
Neutrophils Relative %: 77 %
Platelet Count: 91 10*3/uL — ABNORMAL LOW (ref 150–400)
RBC: 3.29 MIL/uL — ABNORMAL LOW (ref 4.22–5.81)
RDW: 12.8 % (ref 11.5–15.5)
WBC Count: 4.9 10*3/uL (ref 4.0–10.5)
nRBC: 0 % (ref 0.0–0.2)

## 2020-01-31 LAB — CMP (CANCER CENTER ONLY)
ALT: 8 U/L (ref 0–44)
AST: 15 U/L (ref 15–41)
Albumin: 3.3 g/dL — ABNORMAL LOW (ref 3.5–5.0)
Alkaline Phosphatase: 83 U/L (ref 38–126)
Anion gap: 7 (ref 5–15)
BUN: 25 mg/dL — ABNORMAL HIGH (ref 6–20)
CO2: 26 mmol/L (ref 22–32)
Calcium: 8.3 mg/dL — ABNORMAL LOW (ref 8.9–10.3)
Chloride: 102 mmol/L (ref 98–111)
Creatinine: 0.74 mg/dL (ref 0.61–1.24)
GFR, Est AFR Am: >60 mL/min (ref >60)
GFR, Estimated: >60 mL/min (ref >60)
Glucose, Bld: 257 mg/dL — ABNORMAL HIGH (ref 70–99)
Potassium: 4.6 mmol/L (ref 3.5–5.1)
Sodium: 135 mmol/L (ref 135–145)
Total Bilirubin: 0.4 mg/dL (ref 0.3–1.2)
Total Protein: 6.6 g/dL (ref 6.5–8.1)

## 2020-01-31 MED ORDER — LANREOTIDE ACETATE 120 MG/0.5ML ~~LOC~~ SOLN
120.0000 mg | Freq: Once | SUBCUTANEOUS | Status: AC
  Administered 2020-01-31: 120 mg via SUBCUTANEOUS

## 2020-01-31 MED FILL — GABAPENTIN 300 MG CAPSULE: 300 | 20 days supply | Qty: 120 | Fill #3

## 2020-01-31 NOTE — Patient Instructions (Signed)
Lanreotide injection What is this medicine? LANREOTIDE (lan REE oh tide) is used to reduce blood levels of growth hormone in patients with a condition called acromegaly. It also works to slow or stop tumor growth in patients with neuroendocrine tumors and treat carcinoid syndrome. This medicine may be used for other purposes; ask your health care provider or pharmacist if you have questions. COMMON BRAND NAME(S): Somatuline [place] What should I tell my health care provider before I take this medicine? They need to know if you have any of these conditions:  diabetes  gallbladder disease  heart disease  kidney disease  liver disease  thyroid disease  an unusual or allergic reaction to lanreotide, other medicines, foods, dyes, or preservatives  pregnant or trying to get pregnant  breast-feeding How should I use this medicine? This medicine is for injection under the skin. It is given by a health care professional in a hospital or clinic setting. Contact your pediatrician or health care professional regarding the use of this medicine in children. Special care may be needed. Overdosage: If you think you have taken too much of this medicine contact a poison control center or emergency room at once. NOTE: This medicine is only for you. Do not share this medicine with others. What if I miss a dose? It is important not to miss your dose. Call your doctor or health care professional if you are unable to keep an appointment. What may interact with this medicine? This medicine may interact with the following medications:  bromocriptine  cyclosporine  certain medicines for blood pressure, heart disease, irregular heart beat  certain medicines for diabetes  quinidine  terfenadine This list may not describe all possible interactions. Give your health care provider a list of all the medicines, herbs, non-prescription drugs, or dietary supplements you use. Also tell them if you smoke,  drink alcohol, or use illegal drugs. Some items may interact with your medicine. What should I watch for while using this medicine? Tell your doctor or healthcare professional if your symptoms do not start to get better or if they get worse. Visit your doctor or health care professional for regular checks on your progress. Your condition will be monitored carefully while you are receiving this medicine. This medicine may increase blood sugar. Ask your healthcare provider if changes in diet or medicines are needed if you have diabetes. You may need blood work done while you are taking this medicine. Women should inform their doctor if they wish to become pregnant or think they might be pregnant. There is a potential for serious side effects to an unborn child. Talk to your health care professional or pharmacist for more information. Do not breast-feed an infant while taking this medicine or for 6 months after stopping it. This medicine has caused ovarian failure in some women. This medicine may interfere with the ability to have a child. Talk with your doctor or health care professional if you are concerned about your fertility. What side effects may I notice from receiving this medicine? Side effects that you should report to your doctor or health care professional as soon as possible:  allergic reactions like skin rash, itching or hives, swelling of the face, lips, or tongue  increased blood pressure  severe stomach pain  signs and symptoms of hgh blood sugar such as being more thirsty or hungry or having to urinate more than normal. You may also feel very tired or have blurry vision.  signs and symptoms of low blood   sugar such as feeling anxious; confusion; dizziness; increased hunger; unusually weak or tired; sweating; shakiness; cold; irritable; headache; blurred vision; fast heartbeat; loss of consciousness  unusually slow heartbeat Side effects that usually do not require medical  attention (report to your doctor or health care professional if they continue or are bothersome):  constipation  diarrhea  dizziness  headache  muscle pain  muscle spasms  nausea  pain, redness, or irritation at site where injected This list may not describe all possible side effects. Call your doctor for medical advice about side effects. You may report side effects to FDA at 1-800-FDA-1088. Where should I keep my medicine? This drug is given in a hospital or clinic and will not be stored at home. NOTE: This sheet is a summary. It may not cover all possible information. If you have questions about this medicine, talk to your doctor, pharmacist, or health care provider.  2020 Elsevier/Gold Standard (2018-08-31 09:13:08)  

## 2020-01-31 NOTE — Progress Notes (Signed)
Hematology and Oncology Follow Up Visit  [name] [id number] 1961-06-17 59 y.o. 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  Current Therapy:  Temodar/Xeloda - start 03/17/2016 - s/p c#3 - discontinued Somatuline 120mg  SQ q month Lutathera -- cycle #4/4 on 12/19/2019 IV iron as indicated-Feraheme given on 11/20/2019 Inlyta/Keytruda -- start on 08/08/2019 -- d/c on 08/26/2019 due to toxicity Cabometyx/Keytruda -- start cycle #1 on 02/07/2020     Interim History:  Mr.  Burrs is back for followup.  Unfortunately, I think we now have a real problem with his renal cell carcinoma.  We did go ahead and get a CT scan on him.  This actually done on 01/02/2020.  This shows multiple pulmonary nodules.  He has the bilateral renal masses.  He has the spinal canal lesions consistent with hemangioblastomas.  I realize that he does have the neuroendocrine tumors.  I think these are under fairly decent control.  He is on the Somatuline for this.  I think we are going to have to reinitiate systemic therapy for the renal cell carcinomas.  I believe that we should not try Cabometyx along with Keytruda.  I think this would be a reasonable option for him.  Hopefully, he will tolerate the Cabometyx much better than the Inlyta.  Overall, he seems to be holding his own.  He is not be able to come in for a few weeks because he was not feeling all that well.  He does have the indwelling suprapubic catheter.  He does have diabetes.  So far these really have not been too much of a problem for him.  On occasion, he does get iron deficiency.  He was response to IV iron.  His appetite is doing fairly well.  Overall, I would say his performance status is ECOG 1..    Medications:  Current Outpatient Medications:  .  ACCU-CHEK SMARTVIEW  test strip, , Disp: , Rfl: 10 .  baclofen (LIORESAL) 10 mg/mL SUSP, , Disp: , Rfl:  .  baclofen (LIORESAL) 20 MG tablet, , Disp: , Rfl:  .  Balsam [place]-Castor Oil (VENELEX) OINT, Apply topically as needed, Disp: 60 g, Rfl: 4 .  capecitabine (XELODA) 500 MG tablet, Take by mouth., Disp: , Rfl:  .  chlorproMAZINE (THORAZINE) 10 MG tablet, Take by mouth., Disp: , Rfl:  .  diazepam (VALIUM) 5 MG tablet, TAKE 1 TABLET BY MOUTH 3 TIMES DAILY FOR SPASMS/ANXIETY, Disp: 90 tablet, Rfl: 1 .  diphenoxylate-atropine (LOMOTIL) 2.5-0.025 MG tablet, TAKE 1 TABLET BY MOUTH 4 TIMES DAILY AS NEEDED FOR DIARRHEA OR LOOSE STOOLS, Disp: 100 tablet, Rfl: 0 .  doxazosin (CARDURA) 1 MG tablet, Take 1 tablet (1 mg total) by mouth 2 (two) times daily. (Patient taking differently: Take 1 mg by mouth 2 (two) times daily as needed (blood pressure spikes.). ), Disp: 60 tablet, Rfl: 2 .  dronabinol (MARINOL) 5 MG capsule, TAKE 1 CAPSULE BY MOUTH TWICE DAILY WITH MEALS, Disp: 60 capsule, Rfl: 3 .  gabapentin (NEURONTIN) 300 MG capsule, TAKE 2 CAPSULES BY MOUTH 3 TIMES DAILY., Disp: 180 capsule, Rfl: 6 .  HYDROcodone-acetaminophen (NORCO/VICODIN) 5-325 MG tablet, Take 1-2 tablets by mouth every 6 (six) hours as needed for moderate pain., Disp: 90 tablet, Rfl: 0 .  HYDROmorphone (DILAUDID) 4 MG tablet, Take 1 tablet (4 mg total) by mouth every 6 (six)  hours as needed for severe pain., Disp: 40 tablet, Rfl: 0 .  insulin glargine (LANTUS) 100 UNIT/ML injection, Inject 31 Units into the skin daily before breakfast. , Disp: , Rfl:  .  insulin lispro (HUMALOG) 100 UNIT/ML injection, Inject 3-9 Units into the skin 3 (three) times daily before meals. SS, Disp: , Rfl:  .  lipase/protease/amylase (CREON) 12000 units CPEP capsule, Take by mouth., Disp: , Rfl:  .  midodrine (PROAMATINE) 5 MG tablet, TKAE 1/2 TABLET BY MOUTH TWICE A DAY AS NEEDED, Disp: 60 tablet, Rfl: 6 .  NON FORMULARY, Take by mouth., Disp: , Rfl:  .  oxybutynin (DITROPAN)  5 MG tablet, 5 mg 2 (two) times daily. , Disp: , Rfl: 3 .  promethazine (PHENERGAN) 12.5 MG tablet, Take 12.5 mg by mouth every 6 (six) hours as needed for nausea. , Disp: , Rfl:  .  tiZANidine (ZANAFLEX) 4 MG tablet, TAKE 2 TABLETS BY MOUTH EVERY 6 HOURS AS NEEDED FOR PAIN, Disp: 30 tablet, Rfl: 3 .  traMADol (ULTRAM) 50 MG tablet, TAKE 1 TABLET BY MOUTH 3 TIMES DAILY, Disp: 90 tablet, Rfl: 2  Allergies:  Allergies  Allergen Reactions  . Citalopram Diarrhea  . Ciprocin-Fluocin-Procin [Fluocinolone Acetonide] Rash    Pt states this happened with IV Cipro. ABLE TO TAKE PO CIPRO.  . Other Rash    IV-CIPRO    Past Medical History, Surgical history, Social history, and Family History were reviewed and updated.  Review of Systems: Review of Systems  Constitutional: Negative.   HENT: Negative.   Eyes: Negative.   Respiratory: Negative.   Cardiovascular: Negative.   Gastrointestinal: Positive for diarrhea and nausea.  Genitourinary: Positive for frequency.  Musculoskeletal: Positive for myalgias and neck pain.  Skin: Negative.   Neurological: Negative.   Endo/Heme/Allergies: Negative.   Psychiatric/Behavioral: Negative.      Physical Exam:  oral temperature is 97.5 F (36.4 C) (abnormal). His blood pressure is 82/54 (abnormal) and his pulse is 94. His respiration is 18 and oxygen saturation is 99%.   Physical Exam Vitals reviewed.  HENT:     Head: Normocephalic and atraumatic.  Eyes:     Pupils: Pupils are equal, round, and reactive to light.  Cardiovascular:     Rate and Rhythm: Normal rate and regular rhythm.     Heart sounds: Normal heart sounds.  Pulmonary:     Effort: Pulmonary effort is normal.     Breath sounds: Normal breath sounds.  Abdominal:     General: Bowel sounds are normal.     Palpations: Abdomen is soft.     Comments: He has a suprapubic catheter in place  Musculoskeletal:        General: No tenderness or deformity. Normal range of motion.      Cervical back: Normal range of motion.     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  Skin:    General: Skin is warm and dry.     Findings: No erythema or rash.  Neurological:     Mental Status: He is alert and oriented to person, place, and time.  Psychiatric:        Behavior: Behavior normal.        Thought Content: Thought content normal.        Judgment: Judgment normal.   .  Lab Results  Component Value Date   WBC 11.3 (H) 01/09/2018   HGB 13.8 12/05/2017  HCT 43.8 01/09/2018   MCV 86.4 01/09/2018   PLT 288 01/09/2018     Chemistry      Component Value Date/Time   NA 137 01/09/2018 1118   NA 137 12/05/2017 0840   NA 138 10/12/2016 0955   K 3.3 01/09/2018 1118   K 3.4 12/05/2017 0840   K 3.3 (L) 10/12/2016 0955   CL 104 01/09/2018 1118   CL 101 12/05/2017 0840   CO2 30 01/09/2018 1118   CO2 29 12/05/2017 0840   CO2 25 10/12/2016 0955   BUN 14 01/09/2018 1118   BUN 16 12/05/2017 0840   BUN 22.0 10/12/2016 0955   CREATININE 0.8 12/05/2017 0840   CREATININE 0.8 10/12/2016 0955      Component Value Date/Time   CALCIUM 8.8 01/09/2018 1118   CALCIUM 8.6 12/05/2017 0840   CALCIUM 9.0 10/12/2016 0955   ALKPHOS 101 (H) 01/09/2018 1118   ALKPHOS 139 (H) 12/05/2017 0840   ALKPHOS 159 (H) 10/12/2016 0955   AST 20 01/09/2018 1118   AST 45 (H) 10/12/2016 0955   ALT 12 01/09/2018 1118   ALT 19 12/05/2017 0840   ALT 29 10/12/2016 0955   BILITOT 0.8 01/09/2018 1118   BILITOT 0.62 10/12/2016 0955     Impression and Plan:  Mr. Zong is 59 year old gentleman with von Hippel-Lindau syndrome. He has multiple neuroendocrine tumors. 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 He's had multiple spinal surgeries.  Again, I think her problem now are the renal cell carcinomas.  He was not felt to be a candidate for resection when this was first discovered.  He had been going to the [facility]  for evaluations.  I know that they had always been aggressive with him.  They have done multiple neurosurgical procedures on him for his spinal cord tumors and actually brain tumors.  Hopefully, the Cabometyx along with pembrolizumab will help.  I spent about 45 minutes with him today.  I had to spend more time with him talking to them about the change in protocol and that we had to start treating the renal cell carcinomas.  Hopefully, his quality life is not going to diminish.  If he does, he likely will not wish to have any kind of treatment.  I will plan to start therapy next week.  I will see him back 3 weeks afterwards for the start of a second cycle of pembrolizumab.     Volanda Napoleon, MD 2/4/20196:20 PM

## 2020-02-01 ENCOUNTER — Telehealth: Payer: Self-pay | Admitting: Pharmacy Technician

## 2020-02-01 ENCOUNTER — Telehealth: Payer: Self-pay | Admitting: Hematology & Oncology

## 2020-02-01 ENCOUNTER — Encounter: Payer: Self-pay | Admitting: Hematology & Oncology

## 2020-02-01 LAB — IRON AND TIBC
Iron: 55 ug/dL (ref 42–163)
Saturation Ratios: 25 % (ref 20–55)
TIBC: 219 ug/dL (ref 202–409)
UIBC: 164 ug/dL (ref 117–376)

## 2020-02-01 LAB — CHROMOGRANIN A: Chromogranin A (ng/mL): 96 ng/mL (ref 0.0–101.8)

## 2020-02-01 LAB — FERRITIN: Ferritin: 433 ng/mL — ABNORMAL HIGH (ref 24–336)

## 2020-02-01 LAB — LACTATE DEHYDROGENASE: LDH: 135 U/L (ref 98–192)

## 2020-02-01 MED ORDER — CABOMETYX 40 MG PO TABS: 40.0000 mg | ORAL_TABLET | Freq: Every day | ORAL | 4 refills | Status: AC

## 2020-02-01 NOTE — Telephone Encounter (Signed)
Oral Oncology Patient Advocate Encounter   Received notification from [employer] that prior authorization for Cabometyx is required.   PA submitted on CoverMyMeds Key [id number] Status is pending   Oral Oncology Clinic will continue to follow.  [place] Patient [place] Phone [phone] Fax [phone] 02/04/2020 9:46 AM

## 2020-02-01 NOTE — Telephone Encounter (Signed)
Appointments scheduled patient notified VIA My Chart per 2/25 los

## 2020-02-05 ENCOUNTER — Ambulatory Visit: Payer: 59

## 2020-02-05 ENCOUNTER — Other Ambulatory Visit: Payer: 59

## 2020-02-05 NOTE — Telephone Encounter (Signed)
Oral Oncology Patient Advocate Encounter  Prior Authorization for Cabometyx has been approved.    PA# [id number] Effective dates: 02/04/2020 through 02/02/2021  Patients co-pay is $0.00.  Oral Oncology Clinic will continue to follow.   [place] Patient [place] Phone [phone] Fax [phone] 02/05/2020 8:17 AM

## 2020-02-07 ENCOUNTER — Other Ambulatory Visit: Payer: Self-pay | Admitting: Hematology & Oncology

## 2020-02-07 ENCOUNTER — Telehealth: Payer: Self-pay | Admitting: Pharmacist

## 2020-02-07 MED FILL — CREON DR 24,000 UNITS CAP: 24000-76000 | 30 days supply | Qty: 270 | Fill #0

## 2020-02-07 MED FILL — HUMALOG 100 UNITS/ML KWIKPE: 100 | 63 days supply | Qty: 15 | Fill #1

## 2020-02-07 NOTE — Telephone Encounter (Signed)
Oral Oncology Pharmacist Encounter  Received new prescription for Cabometyx (cabozantinib) for the treatment of metastatic RCC in conjunction with pembrolizumab, planned duration until disease progression or unacceptable drug toxicity.  CMP from 01/31/20 assessed, no relevant lab abnormalities. BP from 01/02/20. Well controlled. Prescription dose and frequency assessed.   Current medication list in Epic reviewed, no DDIs with cabozantinib identified.  Prescription has been e-scribed to the [facility] for benefits analysis and approval.  Oral Oncology Clinic will continue to follow for insurance authorization, copayment issues, initial counseling and start date.  Darl Pikes, PharmD, BCPS, [facility] Hematology/Oncology Clinical Pharmacist [facility] [place] [facility] [phone]  02/07/2020 1:58 PM

## 2020-02-07 NOTE — Telephone Encounter (Signed)
Oral Chemotherapy Pharmacist Encounter   Patient advocate Bethena Roys attempted to call patient to set-up medication shipment. No answer she LVM for him to call back.  Darl Pikes, PharmD, BCPS, [facility] Hematology/Oncology Clinical Pharmacist [facility] [place] [facility] [phone]  02/07/2020 2:49 PM

## 2020-02-11 MED FILL — CABOMETYX 40 MG TABLET: 40 | 30 days supply | Qty: 30 | Fill #0

## 2020-02-11 NOTE — Telephone Encounter (Signed)
Oral Chemotherapy Pharmacist Encounter  Patient's wife [name] plans on picking up his medication from [facility] today 02/11/20.  Patient Education I spoke with [name] for overview of new oral chemotherapy medication: Cabometyx (cabozantinib) for the treatment of metastatic RCC in conjunction with pembrolizumab, planned duration until disease progression or unacceptable drug toxicity.   Counseled [name] on administration, dosing, side effects, monitoring, drug-food interactions, safe handling, storage, and disposal. Patient will take 1 tablet (40 mg total) by mouth daily. Take on an empty stomach, 1 hour before or 2 hours after meals.  Side effects include but not limited to: diarrhea, N/V, decreased appetite, hand-foot syndrome, fatigue.    Reviewed with [name] importance of keeping a medication schedule and plan for any missed doses.  [name] voiced understanding and appreciation. All questions answered. Medication handout placed in the mail.  [name] knows to call the office with questions or concerns. Oral Chemotherapy [facility] Clinic will continue to follow.  [name], PharmD, BCPS, BCOP, CPP Hematology/Oncology Clinical Pharmacist [facility] [place] [facility] [phone]  02/11/2020 4:16 PM

## 2020-02-13 ENCOUNTER — Other Ambulatory Visit: Payer: Self-pay | Admitting: Hematology & Oncology

## 2020-02-13 MED ORDER — DOXAZOSIN MESYLATE 2 MG PO TABS: 2.0000 mg | ORAL_TABLET | Freq: Two times a day (BID) | ORAL | 0 refills | Status: DC

## 2020-02-16 ENCOUNTER — Other Ambulatory Visit: Payer: Self-pay | Admitting: Hematology

## 2020-02-16 ENCOUNTER — Other Ambulatory Visit: Payer: Self-pay | Admitting: Hematology & Oncology

## 2020-02-16 MED ORDER — DOXAZOSIN MESYLATE 2 MG PO TABS: 2.0000 mg | ORAL_TABLET | Freq: Two times a day (BID) | ORAL | 0 refills | Status: DC

## 2020-02-17 MED FILL — BACLOFEN 20 MG TABS: 20 | 15 days supply | Qty: 120 | Fill #3

## 2020-02-17 MED FILL — SOLIFENACIN SUCCINATE 5 MG: 5 | 30 days supply | Qty: 30 | Fill #5

## 2020-02-18 ENCOUNTER — Other Ambulatory Visit: Payer: Self-pay | Admitting: *Deleted

## 2020-02-18 MED ORDER — FENTANYL 12 MCG/HR TD PT72: MEDICATED_PATCH | TRANSDERMAL | 0 refills | Status: DC

## 2020-02-18 MED ORDER — DOXAZOSIN MESYLATE 2 MG PO TABS: 2.0000 mg | ORAL_TABLET | Freq: Two times a day (BID) | ORAL | 1 refills | Status: AC

## 2020-02-18 MED FILL — FENTANYL 12 MCG/HR PT72: 12 | 30 days supply | Qty: 10 | Fill #0

## 2020-02-18 MED FILL — OXYBUTYNIN CHLORIDE 5 MG TA: 5 | 30 days supply | Qty: 60 | Fill #2

## 2020-02-19 MED FILL — DRONABINOL 5 MG CAP: 5 | 30 days supply | Qty: 60 | Fill #0

## 2020-02-21 MED FILL — GABAPENTIN 300 MG CAPSULE: 300 | 20 days supply | Qty: 120 | Fill #4

## 2020-02-21 MED FILL — traMADol HCL 50 MG TABS: 50 | 30 days supply | Qty: 90 | Fill #2

## 2020-02-27 ENCOUNTER — Other Ambulatory Visit: Payer: Self-pay

## 2020-02-27 ENCOUNTER — Inpatient Hospital Stay: Payer: 59

## 2020-02-27 ENCOUNTER — Inpatient Hospital Stay: Payer: 59 | Attending: Hematology & Oncology

## 2020-02-27 ENCOUNTER — Encounter: Payer: Self-pay | Admitting: Hematology & Oncology

## 2020-02-27 ENCOUNTER — Inpatient Hospital Stay (HOSPITAL_BASED_OUTPATIENT_CLINIC_OR_DEPARTMENT_OTHER): Payer: 59 | Admitting: Hematology & Oncology

## 2020-02-27 VITALS — BP 107/53 | HR 85 | Temp 96.9°F | Resp 17 | Wt 165.0 lb

## 2020-02-27 DIAGNOSIS — C7A8 Other malignant neuroendocrine tumors: Secondary | ICD-10-CM | POA: Diagnosis not present

## 2020-02-27 DIAGNOSIS — C642 Malignant neoplasm of left kidney, except renal pelvis: Secondary | ICD-10-CM

## 2020-02-27 DIAGNOSIS — C649 Malignant neoplasm of unspecified kidney, except renal pelvis: Secondary | ICD-10-CM | POA: Diagnosis not present

## 2020-02-27 DIAGNOSIS — C7800 Secondary malignant neoplasm of unspecified lung: Secondary | ICD-10-CM | POA: Insufficient documentation

## 2020-02-27 DIAGNOSIS — Q858 Other phakomatoses, not elsewhere classified: Secondary | ICD-10-CM | POA: Diagnosis not present

## 2020-02-27 DIAGNOSIS — D500 Iron deficiency anemia secondary to blood loss (chronic): Secondary | ICD-10-CM

## 2020-02-27 DIAGNOSIS — Z79899 Other long term (current) drug therapy: Secondary | ICD-10-CM | POA: Insufficient documentation

## 2020-02-27 LAB — CBC WITH DIFFERENTIAL (CANCER CENTER ONLY)
Abs Immature Granulocytes: 0.01 10*3/uL (ref 0.00–0.07)
Basophils Absolute: 0 10*3/uL (ref 0.0–0.1)
Basophils Relative: 1 %
Eosinophils Absolute: 0.1 10*3/uL (ref 0.0–0.5)
Eosinophils Relative: 1 %
HCT: 36.2 % — ABNORMAL LOW (ref 39.0–52.0)
Hemoglobin: 12.3 g/dL — ABNORMAL LOW (ref 13.0–17.0)
Immature Granulocytes: 0 %
Lymphocytes Relative: 12 %
Lymphs Abs: 0.5 10*3/uL — ABNORMAL LOW (ref 0.7–4.0)
MCH: 32.7 pg (ref 26.0–34.0)
MCHC: 34 g/dL (ref 30.0–36.0)
MCV: 96.3 fL (ref 80.0–100.0)
Monocytes Absolute: 0.3 10*3/uL (ref 0.1–1.0)
Monocytes Relative: 8 %
Neutro Abs: 3.2 10*3/uL (ref 1.7–7.7)
Neutrophils Relative %: 78 %
Platelet Count: 114 10*3/uL — ABNORMAL LOW (ref 150–400)
RBC: 3.76 MIL/uL — ABNORMAL LOW (ref 4.22–5.81)
RDW: 12.8 % (ref 11.5–15.5)
WBC Count: 4.1 10*3/uL (ref 4.0–10.5)
nRBC: 0 % (ref 0.0–0.2)

## 2020-02-27 LAB — CMP (CANCER CENTER ONLY)
ALT: 19 U/L (ref 0–44)
AST: 23 U/L (ref 15–41)
Albumin: 3.4 g/dL — ABNORMAL LOW (ref 3.5–5.0)
Alkaline Phosphatase: 100 U/L (ref 38–126)
Anion gap: 6 (ref 5–15)
BUN: 18 mg/dL (ref 6–20)
CO2: 29 mmol/L (ref 22–32)
Calcium: 8.8 mg/dL — ABNORMAL LOW (ref 8.9–10.3)
Chloride: 99 mmol/L (ref 98–111)
Creatinine: 0.73 mg/dL (ref 0.61–1.24)
GFR, Est AFR Am: >60 mL/min (ref >60)
GFR, Estimated: >60 mL/min (ref >60)
Glucose, Bld: 245 mg/dL — ABNORMAL HIGH (ref 70–99)
Potassium: 4.1 mmol/L (ref 3.5–5.1)
Sodium: 134 mmol/L — ABNORMAL LOW (ref 135–145)
Total Bilirubin: 0.4 mg/dL (ref 0.3–1.2)
Total Protein: 6.9 g/dL (ref 6.5–8.1)

## 2020-02-27 MED ORDER — SODIUM CHLORIDE 0.9 % IV SOLN
Freq: Once | INTRAVENOUS | Status: AC
  Filled 2020-02-27: qty 250

## 2020-02-27 MED ORDER — LANREOTIDE ACETATE 120 MG/0.5ML ~~LOC~~ SOLN
120.0000 mg | Freq: Once | SUBCUTANEOUS | Status: AC
  Administered 2020-02-27: 120 mg via SUBCUTANEOUS

## 2020-02-27 MED ORDER — SODIUM CHLORIDE 0.9 % IV SOLN
200.0000 mg | Freq: Once | INTRAVENOUS | Status: AC
  Administered 2020-02-27: 200 mg via INTRAVENOUS
  Filled 2020-02-27: qty 8

## 2020-02-27 NOTE — Patient Instructions (Signed)
Lanreotide injection What is this medicine? LANREOTIDE (lan REE oh tide) is used to reduce blood levels of growth hormone in patients with a condition called acromegaly. It also works to slow or stop tumor growth in patients with neuroendocrine tumors and treat carcinoid syndrome. This medicine may be used for other purposes; ask your health care provider or pharmacist if you have questions. COMMON BRAND NAME(S): Somatuline [place] What should I tell my health care provider before I take this medicine? They need to know if you have any of these conditions:  diabetes  gallbladder disease  heart disease  kidney disease  liver disease  thyroid disease  an unusual or allergic reaction to lanreotide, other medicines, foods, dyes, or preservatives  pregnant or trying to get pregnant  breast-feeding How should I use this medicine? This medicine is for injection under the skin. It is given by a health care professional in a hospital or clinic setting. Contact your pediatrician or health care professional regarding the use of this medicine in children. Special care may be needed. Overdosage: If you think you have taken too much of this medicine contact a poison control center or emergency room at once. NOTE: This medicine is only for you. Do not share this medicine with others. What if I miss a dose? It is important not to miss your dose. Call your doctor or health care professional if you are unable to keep an appointment. What may interact with this medicine? This medicine may interact with the following medications:  bromocriptine  cyclosporine  certain medicines for blood pressure, heart disease, irregular heart beat  certain medicines for diabetes  quinidine  terfenadine This list may not describe all possible interactions. Give your health care provider a list of all the medicines, herbs, non-prescription drugs, or dietary supplements you use. Also tell them if you smoke,  drink alcohol, or use illegal drugs. Some items may interact with your medicine. What should I watch for while using this medicine? Tell your doctor or healthcare professional if your symptoms do not start to get better or if they get worse. Visit your doctor or health care professional for regular checks on your progress. Your condition will be monitored carefully while you are receiving this medicine. This medicine may increase blood sugar. Ask your healthcare provider if changes in diet or medicines are needed if you have diabetes. You may need blood work done while you are taking this medicine. Women should inform their doctor if they wish to become pregnant or think they might be pregnant. There is a potential for serious side effects to an unborn child. Talk to your health care professional or pharmacist for more information. Do not breast-feed an infant while taking this medicine or for 6 months after stopping it. This medicine has caused ovarian failure in some women. This medicine may interfere with the ability to have a child. Talk with your doctor or health care professional if you are concerned about your fertility. What side effects may I notice from receiving this medicine? Side effects that you should report to your doctor or health care professional as soon as possible:  allergic reactions like skin rash, itching or hives, swelling of the face, lips, or tongue  increased blood pressure  severe stomach pain  signs and symptoms of hgh blood sugar such as being more thirsty or hungry or having to urinate more than normal. You may also feel very tired or have blurry vision.  signs and symptoms of low blood   sugar such as feeling anxious; confusion; dizziness; increased hunger; unusually weak or tired; sweating; shakiness; cold; irritable; headache; blurred vision; fast heartbeat; loss of consciousness  unusually slow heartbeat Side effects that usually do not require medical  attention (report to your doctor or health care professional if they continue or are bothersome):  constipation  diarrhea  dizziness  headache  muscle pain  muscle spasms  nausea  pain, redness, or irritation at site where injected This list may not describe all possible side effects. Call your doctor for medical advice about side effects. You may report side effects to FDA at 1-800-FDA-1088. Where should I keep my medicine? This drug is given in a hospital or clinic and will not be stored at home. NOTE: This sheet is a summary. It may not cover all possible information. If you have questions about this medicine, talk to your doctor, pharmacist, or health care provider.  2020 Elsevier/Gold Standard (2018-08-31 09:13:08) Pembrolizumab injection What is this medicine? PEMBROLIZUMAB (pem broe liz ue mab) is a monoclonal antibody. It is used to treat certain types of cancer. This medicine may be used for other purposes; ask your health care provider or pharmacist if you have questions. COMMON BRAND NAME(S): Keytruda What should I tell my health care provider before I take this medicine? They need to know if you have any of these conditions:  diabetes  immune system problems  inflammatory bowel disease  liver disease  lung or breathing disease  lupus  received or scheduled to receive an organ transplant or a stem-cell transplant that uses donor stem cells  an unusual or allergic reaction to pembrolizumab, other medicines, foods, dyes, or preservatives  pregnant or trying to get pregnant  breast-feeding How should I use this medicine? This medicine is for infusion into a vein. It is given by a health care professional in a hospital or clinic setting. A special MedGuide will be given to you before each treatment. Be sure to read this information carefully each time. Talk to your pediatrician regarding the use of this medicine in children. While this drug may be  prescribed for children as young as 6 months for selected conditions, precautions do apply. Overdosage: If you think you have taken too much of this medicine contact a poison control center or emergency room at once. NOTE: This medicine is only for you. Do not share this medicine with others. What if I miss a dose? It is important not to miss your dose. Call your doctor or health care professional if you are unable to keep an appointment. What may interact with this medicine? Interactions have not been studied. Give your health care provider a list of all the medicines, herbs, non-prescription drugs, or dietary supplements you use. Also tell them if you smoke, drink alcohol, or use illegal drugs. Some items may interact with your medicine. This list may not describe all possible interactions. Give your health care provider a list of all the medicines, herbs, non-prescription drugs, or dietary supplements you use. Also tell them if you smoke, drink alcohol, or use illegal drugs. Some items may interact with your medicine. What should I watch for while using this medicine? Your condition will be monitored carefully while you are receiving this medicine. You may need blood work done while you are taking this medicine. Do not become pregnant while taking this medicine or for 4 months after stopping it. Women should inform their doctor if they wish to become pregnant or think they might be pregnant.  There is a potential for serious side effects to an unborn child. Talk to your health care professional or pharmacist for more information. Do not breast-feed an infant while taking this medicine or for 4 months after the last dose. What side effects may I notice from receiving this medicine? Side effects that you should report to your doctor or health care professional as soon as possible:  allergic reactions like skin rash, itching or hives, swelling of the face, lips, or tongue  bloody or black,  tarry  breathing problems  changes in vision  chest pain  chills  confusion  constipation  cough  diarrhea  dizziness or feeling faint or lightheaded  fast or irregular heartbeat  fever  flushing  joint pain  low blood counts - this medicine may decrease the number of white blood cells, red blood cells and platelets. You may be at increased risk for infections and bleeding.  muscle pain  muscle weakness  pain, tingling, numbness in the hands or feet  persistent headache  redness, blistering, peeling or loosening of the skin, including inside the mouth  signs and symptoms of high blood sugar such as dizziness; dry mouth; dry skin; fruity breath; nausea; stomach pain; increased hunger or thirst; increased urination  signs and symptoms of kidney injury like trouble passing urine or change in the amount of urine  signs and symptoms of liver injury like dark urine, light-colored stools, loss of appetite, nausea, right upper belly pain, yellowing of the eyes or skin  sweating  swollen lymph nodes  weight loss Side effects that usually do not require medical attention (report to your doctor or health care professional if they continue or are bothersome):  decreased appetite  hair loss  muscle pain  tiredness This list may not describe all possible side effects. Call your doctor for medical advice about side effects. You may report side effects to FDA at 1-800-FDA-1088. Where should I keep my medicine? This drug is given in a hospital or clinic and will not be stored at home. NOTE: This sheet is a summary. It may not cover all possible information. If you have questions about this medicine, talk to your doctor, pharmacist, or health care provider.  2020 Elsevier/Gold Standard (2019-09-28 18:07:58)

## 2020-02-27 NOTE — Progress Notes (Signed)
Hematology and Oncology Follow Up Visit  [name] [id number] [birth year] 59 y.o. 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  Current Therapy:  Temodar/Xeloda - start 03/17/2016 - s/p c#3 - discontinued Somatuline 120mg  SQ q month Lutathera -- cycle #4/4 on 12/19/2019 IV iron as indicated-Feraheme given on 11/20/2019 Inlyta/Keytruda -- start on 08/08/2019 -- d/c on 08/26/2019 due to toxicity Cabometyx/Keytruda -- s/p cycle #1 - started on 02/07/2020     Interim History:  [name] is back for followup.  He is actually doing quite good.  He is tolerating the Cabometyx well so far.  He is having some blood pressure issues with this.  His blood pressure does tend to go up which is no surprise with the Cabometyx.  He takes Cardura.  Even with the milligram Cardura dose, his blood pressure tends to drop quite a bit.  He has had no nausea or vomiting.  He has had no bleeding.  He has the indwelling suprapubic catheter for his urinary output.  He has had no problems with abdominal spasms.  He has had no cough or shortness of breath.  He is had no issues with the Keytruda.  He has had no rashes.  I am glad that his quality of life is still doing quite well.  Overall, his performance status is ECOG 1.   Medications:  Current Outpatient Medications:  .  ACCU-CHEK SMARTVIEW test strip, , Disp: , Rfl: 10 .  baclofen (LIORESAL) 10 mg/mL SUSP, , Disp: , Rfl:  .  baclofen (LIORESAL) 20 MG tablet, , Disp: , Rfl:  .  Balsam [place]-Castor Oil (VENELEX) OINT, Apply topically as needed, Disp: 60 g, Rfl: 4 .  capecitabine (XELODA) 500 MG tablet, Take by mouth., Disp: , Rfl:  .  chlorproMAZINE (THORAZINE) 10 MG tablet, Take by mouth., Disp: , Rfl:  .  diazepam (VALIUM) 5 MG tablet, TAKE 1 TABLET BY MOUTH 3 TIMES DAILY FOR  SPASMS/ANXIETY, Disp: 90 tablet, Rfl: 1 .  diphenoxylate-atropine (LOMOTIL) 2.5-0.025 MG tablet, TAKE 1 TABLET BY MOUTH 4 TIMES DAILY AS NEEDED FOR DIARRHEA OR LOOSE STOOLS, Disp: 100 tablet, Rfl: 0 .  doxazosin (CARDURA) 1 MG tablet, Take 1 tablet (1 mg total) by mouth 2 (two) times daily. (Patient taking differently: Take 1 mg by mouth 2 (two) times daily as needed (blood pressure spikes.). ), Disp: 60 tablet, Rfl: 2 .  dronabinol (MARINOL) 5 MG capsule, TAKE 1 CAPSULE BY MOUTH TWICE DAILY WITH MEALS, Disp: 60 capsule, Rfl: 3 .  gabapentin (NEURONTIN) 300 MG capsule, TAKE 2 CAPSULES BY MOUTH 3 TIMES DAILY., Disp: 180 capsule, Rfl: 6 .  HYDROcodone-acetaminophen (NORCO/VICODIN) 5-325 MG tablet, Take 1-2 tablets by mouth every 6 (six) hours as needed for moderate pain., Disp: 90 tablet, Rfl: 0 .  HYDROmorphone (DILAUDID) 4 MG tablet, Take 1 tablet (4 mg total) by mouth every 6 (six) hours as needed for severe pain., Disp: 40 tablet, Rfl: 0 .  insulin glargine (LANTUS) 100 UNIT/ML injection, Inject 31 Units into the skin daily before breakfast. , Disp: , Rfl:  .  insulin lispro (HUMALOG) 100 UNIT/ML injection, Inject 3-9 Units into the skin 3 (three) times daily before meals. SS, Disp: , Rfl:  .  lipase/protease/amylase (CREON) 12000 units CPEP capsule, Take by mouth., Disp: , Rfl:  .  midodrine (PROAMATINE) 5 MG tablet, TKAE 1/2  TABLET BY MOUTH TWICE A DAY AS NEEDED, Disp: 60 tablet, Rfl: 6 .  NON FORMULARY, Take by mouth., Disp: , Rfl:  .  oxybutynin (DITROPAN) 5 MG tablet, 5 mg 2 (two) times daily. , Disp: , Rfl: 3 .  promethazine (PHENERGAN) 12.5 MG tablet, Take 12.5 mg by mouth every 6 (six) hours as needed for nausea. , Disp: , Rfl:  .  tiZANidine (ZANAFLEX) 4 MG tablet, TAKE 2 TABLETS BY MOUTH EVERY 6 HOURS AS NEEDED FOR PAIN, Disp: 30 tablet, Rfl: 3 .  traMADol (ULTRAM) 50 MG tablet, TAKE 1 TABLET BY MOUTH 3 TIMES DAILY, Disp: 90 tablet, Rfl: 2  Allergies:  Allergies  Allergen Reactions  .  Citalopram Diarrhea  . Ciprocin-Fluocin-Procin [Fluocinolone Acetonide] Rash    Pt states this happened with IV Cipro. ABLE TO TAKE PO CIPRO.  . Other Rash    IV-CIPRO    Past Medical History, Surgical history, Social history, and Family History were reviewed and updated.  Review of Systems: Review of Systems  Constitutional: Negative.   HENT: Negative.   Eyes: Negative.   Respiratory: Negative.   Cardiovascular: Negative.   Gastrointestinal: Positive for diarrhea and nausea.  Genitourinary: Positive for frequency.  Musculoskeletal: Positive for myalgias and neck pain.  Skin: Negative.   Neurological: Negative.   Endo/Heme/Allergies: Negative.   Psychiatric/Behavioral: Negative.      Physical Exam:  oral temperature is 97.5 F (36.4 C) (abnormal). His blood pressure is 82/54 (abnormal) and his pulse is 94. His respiration is 18 and oxygen saturation is 99%.   Physical Exam Vitals reviewed.  HENT:     Head: Normocephalic and atraumatic.  Eyes:     Pupils: Pupils are equal, round, and reactive to light.  Cardiovascular:     Rate and Rhythm: Normal rate and regular rhythm.     Heart sounds: Normal heart sounds.  Pulmonary:     Effort: Pulmonary effort is normal.     Breath sounds: Normal breath sounds.  Abdominal:     General: Bowel sounds are normal.     Palpations: Abdomen is soft.     Comments: He has a suprapubic catheter in place  Musculoskeletal:        General: No tenderness or deformity. Normal range of motion.     Cervical back: Normal range of motion.     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  Skin:    General: Skin is warm and dry.     Findings: No erythema or rash.  Neurological:     Mental Status: He is alert and oriented to person, place, and time.  Psychiatric:        Behavior: Behavior normal.        Thought Content: Thought content normal.        Judgment: Judgment normal.    .  Lab Results  Component Value Date   WBC 11.3 (H) 01/09/2018   HGB 13.8 12/05/2017   HCT 43.8 01/09/2018   MCV 86.4 01/09/2018   PLT 288 01/09/2018     Chemistry      Component Value Date/Time   NA 137 01/09/2018 1118   NA 137 12/05/2017 0840   NA 138 10/12/2016 0955   K 3.3 01/09/2018 1118   K 3.4 12/05/2017 0840   K 3.3 (L) 10/12/2016 0955   CL 104 01/09/2018 1118   CL 101 12/05/2017 0840   CO2 30 01/09/2018 1118  CO2 29 12/05/2017 0840   CO2 25 10/12/2016 0955   BUN 14 01/09/2018 1118   BUN 16 12/05/2017 0840   BUN 22.0 10/12/2016 0955   CREATININE 0.8 12/05/2017 0840   CREATININE 0.8 10/12/2016 0955      Component Value Date/Time   CALCIUM 8.8 01/09/2018 1118   CALCIUM 8.6 12/05/2017 0840   CALCIUM 9.0 10/12/2016 0955   ALKPHOS 101 (H) 01/09/2018 1118   ALKPHOS 139 (H) 12/05/2017 0840   ALKPHOS 159 (H) 10/12/2016 0955   AST 20 01/09/2018 1118   AST 45 (H) 10/12/2016 0955   ALT 12 01/09/2018 1118   ALT 19 12/05/2017 0840   ALT 29 10/12/2016 0955   BILITOT 0.8 01/09/2018 1118   BILITOT 0.62 10/12/2016 0955     Impression and Plan:  Mr. Luty is 59 year old gentleman with von Hippel-Lindau syndrome. He has multiple neuroendocrine tumors. 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 He's had multiple spinal surgeries.  We will continue him on the Keytruda/Cabometyx.  It is still way too early to know how things will respond.  I would like to hope that he is going to respond.  He also is dealing with the neuroendocrine tumors.  He will get his Somatuline today.  Of note, his last chromogranin A level is 96 back in February.we will have to see what it is today.  I will plan to get him back in 3 more weeks.  I probably would not do a CT scan on him until he has had 4 cycles of the pembrolizumab.       Volanda Napoleon, MD 2/4/20196:20 PM

## 2020-02-28 LAB — IRON AND TIBC
Iron: 52 ug/dL (ref 42–163)
Saturation Ratios: 23 % (ref 20–55)
TIBC: 229 ug/dL (ref 202–409)
UIBC: 177 ug/dL (ref 117–376)

## 2020-02-28 LAB — CHROMOGRANIN A: Chromogranin A (ng/mL): 65.6 ng/mL (ref 0.0–101.8)

## 2020-02-28 LAB — LACTATE DEHYDROGENASE: LDH: 171 U/L (ref 98–192)

## 2020-02-28 LAB — TSH: TSH: 2.739 u[IU]/mL (ref 0.320–4.118)

## 2020-02-28 LAB — FERRITIN: Ferritin: 555 ng/mL — ABNORMAL HIGH (ref 24–336)

## 2020-03-08 MED FILL — CREON DR 24,000 UNITS CAP: 24000-76000 | 30 days supply | Qty: 270 | Fill #1

## 2020-03-12 MED FILL — CABOMETYX 40 MG TABLET: 40 | 30 days supply | Qty: 30 | Fill #1

## 2020-03-12 NOTE — Progress Notes (Signed)
Pharmacist Chemotherapy Monitoring - Follow Up Assessment    I verify that I have reviewed each item in the below checklist:  . Regimen for the patient is scheduled for the appropriate day and plan matches scheduled date. Marland Kitchen Appropriate non-routine labs are ordered dependent on drug ordered. . If applicable, additional medications reviewed and ordered per protocol based on lifetime cumulative doses and/or treatment regimen.   Plan for follow-up and/or issues identified: Yes . I-vent associated with next due treatment: Yes . MD and/or nursing notified: Yes  Romualdo Bolk [facility] 03/12/2020 8:51 AM

## 2020-03-14 MED FILL — GABAPENTIN 300 MG CAPSULE: 300 | 20 days supply | Qty: 120 | Fill #5

## 2020-03-18 ENCOUNTER — Other Ambulatory Visit: Payer: Self-pay | Admitting: Hematology & Oncology

## 2020-03-18 MED FILL — SOLIFENACIN SUCCINATE 5 MG: 5 | 30 days supply | Qty: 30 | Fill #0

## 2020-03-19 ENCOUNTER — Inpatient Hospital Stay (HOSPITAL_BASED_OUTPATIENT_CLINIC_OR_DEPARTMENT_OTHER): Payer: 59 | Admitting: Family

## 2020-03-19 ENCOUNTER — Inpatient Hospital Stay: Payer: 59

## 2020-03-19 ENCOUNTER — Encounter: Payer: Self-pay | Admitting: Family

## 2020-03-19 ENCOUNTER — Other Ambulatory Visit: Payer: Self-pay

## 2020-03-19 ENCOUNTER — Inpatient Hospital Stay: Payer: 59 | Attending: Hematology & Oncology

## 2020-03-19 VITALS — BP 166/128 | HR 72 | Temp 97.1°F | Resp 18 | Ht 74.0 in

## 2020-03-19 DIAGNOSIS — Z79899 Other long term (current) drug therapy: Secondary | ICD-10-CM | POA: Diagnosis not present

## 2020-03-19 DIAGNOSIS — D500 Iron deficiency anemia secondary to blood loss (chronic): Secondary | ICD-10-CM | POA: Insufficient documentation

## 2020-03-19 DIAGNOSIS — C642 Malignant neoplasm of left kidney, except renal pelvis: Secondary | ICD-10-CM | POA: Diagnosis not present

## 2020-03-19 DIAGNOSIS — I10 Essential (primary) hypertension: Secondary | ICD-10-CM | POA: Insufficient documentation

## 2020-03-19 DIAGNOSIS — C7800 Secondary malignant neoplasm of unspecified lung: Secondary | ICD-10-CM | POA: Diagnosis not present

## 2020-03-19 DIAGNOSIS — C7A8 Other malignant neuroendocrine tumors: Secondary | ICD-10-CM

## 2020-03-19 LAB — CBC WITH DIFFERENTIAL (CANCER CENTER ONLY)
Abs Immature Granulocytes: 0.02 10*3/uL (ref 0.00–0.07)
Basophils Absolute: 0 10*3/uL (ref 0.0–0.1)
Basophils Relative: 1 %
Eosinophils Absolute: 0.1 10*3/uL (ref 0.0–0.5)
Eosinophils Relative: 2 %
HCT: 33.8 % — ABNORMAL LOW (ref 39.0–52.0)
Hemoglobin: 11.4 g/dL — ABNORMAL LOW (ref 13.0–17.0)
Immature Granulocytes: 1 %
Lymphocytes Relative: 21 %
Lymphs Abs: 0.8 10*3/uL (ref 0.7–4.0)
MCH: 32.1 pg (ref 26.0–34.0)
MCHC: 33.7 g/dL (ref 30.0–36.0)
MCV: 95.2 fL (ref 80.0–100.0)
Monocytes Absolute: 0.4 10*3/uL (ref 0.1–1.0)
Monocytes Relative: 9 %
Neutro Abs: 2.6 10*3/uL (ref 1.7–7.7)
Neutrophils Relative %: 66 %
Platelet Count: 72 10*3/uL — ABNORMAL LOW (ref 150–400)
RBC: 3.55 MIL/uL — ABNORMAL LOW (ref 4.22–5.81)
RDW: 14 % (ref 11.5–15.5)
WBC Count: 3.9 10*3/uL — ABNORMAL LOW (ref 4.0–10.5)
nRBC: 0 % (ref 0.0–0.2)

## 2020-03-19 LAB — CMP (CANCER CENTER ONLY)
ALT: 28 U/L (ref 0–44)
AST: 32 U/L (ref 15–41)
Albumin: 2.9 g/dL — ABNORMAL LOW (ref 3.5–5.0)
Alkaline Phosphatase: 100 U/L (ref 38–126)
Anion gap: 5 (ref 5–15)
BUN: 18 mg/dL (ref 6–20)
CO2: 29 mmol/L (ref 22–32)
Calcium: 8.1 mg/dL — ABNORMAL LOW (ref 8.9–10.3)
Chloride: 97 mmol/L — ABNORMAL LOW (ref 98–111)
Creatinine: 0.65 mg/dL (ref 0.61–1.24)
GFR, Est AFR Am: >60 mL/min (ref >60)
GFR, Estimated: >60 mL/min (ref >60)
Glucose, Bld: 172 mg/dL — ABNORMAL HIGH (ref 70–99)
Potassium: 4.2 mmol/L (ref 3.5–5.1)
Sodium: 131 mmol/L — ABNORMAL LOW (ref 135–145)
Total Bilirubin: 0.5 mg/dL (ref 0.3–1.2)
Total Protein: 6 g/dL — ABNORMAL LOW (ref 6.5–8.1)

## 2020-03-19 NOTE — Progress Notes (Signed)
Hematology and Oncology Follow Up Visit  Javario Foyt [id number] 18-Jul-1961 59 y.o. 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  Past Therapy: Temodar/Xeloda - start 03/17/2016 - s/p c#3 - discontinued Inlyta/Keytruda -- start on 08/08/2019 -- d/c on 08/26/2019 due to toxicity Lutathera -- cycle #4/4 on 12/19/2019  Current Therapy:  Somatuline 120mg  SQ q month IV iron as indicated-Feraheme given on 11/20/2019 Cabometyx/Keytruda -- s/p cycle 1 - started on 02/07/2020 - Cabometyx stopped 03/19/2020   Interim History:  Mr. Speak is here today for follow-up and treatment. He is symptomatic with hypertension. This does improve with Cardura PRN. He is checking his BP twice daily.  His highest BP at home was 220's/110's. BP today is 166/128.  He has had intermittent headaches. Vision now seems better without his glasses.  He has been having panic attacks at night along with some mild paranoia.  He had an episode of what he feels was musculoskeletal pain in the left shoulder and went into the chest with movement. This has since resolved.  No syncopal episodes to report.  No fever, n/v, cough, rash, dizziness, SOB, palpitations, abdominal pain or changes in bowel program or urostomy function.  He does have some abdominal bloating at times but states that this is unchanged.  He had a little blood in his stool yesterday and has a small ulcer on his backside that his wife is keeping clean and dressed.  No other blood loss has been noted. No bruising or petechiae.  Neuropathy in his extremities is stable/unchanged.  No swelling noted at this time.  His appetite is ok. He does feel that he is hydrating well.  ECOG Performance Status: 1 - Symptomatic but completely ambulatory  Medications:  Allergies as of 03/19/2020    Reactions   Ciprofloxacin Hives   IV only   Citalopram Diarrhea   Ciprocin-fluocin-procin [fluocinolone Acetonide] Rash   Pt states this happened with IV Cipro. ABLE TO TAKE PO CIPRO.      Medication List       Accurate as of March 19, 2020 12:00 PM. If you have any questions, ask your nurse or doctor.        Accu-Chek SmartView test strip Generic drug: glucose blood   baclofen 20 MG tablet Commonly known as: LIORESAL TAKE 1 TO 2 TABLETS BY MOUTH 4 TIMES A DAY   Baqsimi One Pack 3 MG/DOSE Powd Generic drug: Glucagon Place 1 spray into the nose daily as needed (low glucose).   Cabometyx 40 MG tablet Generic drug: cabozantinib Take 1 tablet (40 mg total) by mouth daily. Take on an empty stomach, 1 hour before or 2 hours after meals.   cholecalciferol 25 MCG (1000 UNIT) tablet Commonly known as: VITAMIN D3 Take 1,000 Units by mouth daily.   CRANBERRY CONCENTRATE PO Take 1 capsule by mouth 3 (three) times daily.   Creon 24000-76000 units Cpep Generic drug: Pancrelipase (Lip-Prot-Amyl) TAKE 3 CAPSULES BY MOUTH THREE TIMES DAILY WITH MEALS   diazepam 5 MG tablet Commonly known as: VALIUM TAKE 1 TABLET BY MOUTH 3 TIMES DAILY FOR SPASMS/ANXIETY   diphenoxylate-atropine 2.5-0.025 MG tablet Commonly known as: LOMOTIL TAKE 1 TABLET BY MOUTH 4 TIMES DAILY AS NEEDED FOR DIARRHEA OR LOOSE STOOLS   doxazosin 2 MG tablet Commonly known as: Cardura Take 1 tablet (2 mg total) by mouth 2 (two) times daily.   dronabinol  5 MG capsule Commonly known as: MARINOL TAKE 1 CAPSULE BY MOUTH TWICE DAILY WITH MEALS   fentaNYL 12 MCG/HR Commonly known as: DURAGESIC APPLY 1 PATCH ON THE SKIN EVERY 3 DAYS   gabapentin 300 MG capsule Commonly known as: NEURONTIN Take 2 capsules (600 mg total) by mouth 3 (three) times daily.   HYDROcodone-acetaminophen 5-325 MG tablet Commonly known as: NORCO/VICODIN Take 1-2 tablets by mouth every 6 (six) hours as needed for moderate pain.     HYDROmorphone 4 MG tablet Commonly known as: DILAUDID Take 1 tablet (4 mg total) by mouth every 6 (six) hours as needed for severe pain.   insulin glargine 100 UNIT/ML injection Commonly known as: LANTUS Inject 25 Units into the skin daily before breakfast.   insulin lispro 100 UNIT/ML injection Commonly known as: HUMALOG Inject 3-9 Units into the skin 3 (three) times daily before meals. SS   midodrine 5 MG tablet Commonly known as: PROAMATINE TAKE 0.5 TABLET BY MOUTH TWICE A DAY AS NEEDED What changed: See the new instructions.   oxybutynin 5 MG tablet Commonly known as: DITROPAN TAKE 1 TABLET BY MOUTH TWICE DAILY FOR BLADDER SPASMS IF NEEDED   promethazine 12.5 MG tablet Commonly known as: PHENERGAN Take 1 tablet (12.5 mg total) by mouth every 6 (six) hours as needed for nausea.   solifenacin 5 MG tablet Commonly known as: VESICARE TAKE 1 TABLET BY MOUTH ONCE DAILY   tiZANidine 4 MG tablet Commonly known as: ZANAFLEX Take 2 tablets (8 mg total) by mouth every 6 (six) hours as needed. For pain   traMADol 50 MG tablet Commonly known as: ULTRAM TAKE 1 TABLET BY MOUTH 3 TIMES DAILY   vitamin C 1000 MG tablet Take 1,000 mg by mouth daily.       Allergies:  Allergies  Allergen Reactions  . Ciprofloxacin Hives    IV only  . Citalopram Diarrhea  . Ciprocin-Fluocin-Procin [Fluocinolone Acetonide] Rash    Pt states this happened with IV Cipro. ABLE TO TAKE PO CIPRO.    Past Medical History, Surgical history, Social history, and Family History were reviewed and updated.  Review of Systems: All other 10 point review of systems is negative.   Physical Exam:  vitals were not taken for this visit.   Wt Readings from Last 3 Encounters:  02/27/20 165 lb (74.8 kg)  07/27/19 161 lb (73 kg)  07/12/19 160 lb (72.6 kg)    Ocular: Sclerae unicteric, pupils equal, round and reactive to light Ear-nose-throat: Oropharynx clear, dentition fair Lymphatic: No cervical or  supraclavicular adenopathy Lungs no rales or rhonchi, good excursion bilaterally Heart regular rate and rhythm, no murmur appreciated Abd soft, nontender, positive bowel sounds, urostomy intact and functioning appropriately, no liver or spleen tip palpated, no fluid wave  MSK no focal spinal tenderness, no joint edema Neuro: non-focal, well-oriented, appropriate affect Breasts: Deferred   Lab Results  Component Value Date   WBC 3.9 (L) 03/19/2020   HGB 11.4 (L) 03/19/2020   HCT 33.8 (L) 03/19/2020   MCV 95.2 03/19/2020   PLT 72 (L) 03/19/2020   Lab Results  Component Value Date   FERRITIN 555 (H) 02/27/2020   IRON 52 02/27/2020   TIBC 229 02/27/2020   UIBC 177 02/27/2020   IRONPCTSAT 23 02/27/2020   Lab Results  Component Value Date   RETICCTPCT 0.9 02/07/2019   RBC 3.55 (L) 03/19/2020   RETICCTABS 18.8 (L) 03/21/2009   No results found for: KPAFRELGTCHN, LAMBDASER, KAPLAMBRATIO No results found  for: Kandis Cocking, IGMSERUM No results found for: Kathrynn Ducking, MSPIKE, SPEI   Chemistry      Component Value Date/Time   NA 134 (L) 02/27/2020 1121   NA 137 12/05/2017 0840   NA 138 10/12/2016 0955   K 4.1 02/27/2020 1121   K 3.4 12/05/2017 0840   K 3.3 (L) 10/12/2016 0955   CL 99 02/27/2020 1121   CL 101 12/05/2017 0840   CO2 29 02/27/2020 1121   CO2 29 12/05/2017 0840   CO2 25 10/12/2016 0955   BUN 18 02/27/2020 1121   BUN 16 12/05/2017 0840   BUN 22.0 10/12/2016 0955   CREATININE 0.73 02/27/2020 1121   CREATININE 0.8 12/05/2017 0840   CREATININE 0.8 10/12/2016 0955      Component Value Date/Time   CALCIUM 8.8 (L) 02/27/2020 1121   CALCIUM 8.6 12/05/2017 0840   CALCIUM 9.0 10/12/2016 0955   ALKPHOS 100 02/27/2020 1121   ALKPHOS 139 (H) 12/05/2017 0840   ALKPHOS 159 (H) 10/12/2016 0955   AST 23 02/27/2020 1121   AST 45 (H) 10/12/2016 0955   ALT 19 02/27/2020 1121   ALT 19 12/05/2017 0840   ALT 29 10/12/2016 0955     BILITOT 0.4 02/27/2020 1121   BILITOT 0.62 10/12/2016 0955       Impression and Plan: Mr. Kall is 59 yo caucasian gentleman with von Hippel-Lindau syndrome. 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  Chromogranin A last month was stable at 65.  I went over all of the above mentioned symptoms with Dr. Marin Olp and we will have him stop the Cabometyx at this time. We will also hold his Keytruda and Somatuline treatments today as well. He is in agreement with this.  Hopefully he will note resolution of the panic attacks and paranoia.  He will continue to take the Cardura as needed for his BP.  We will see him back again in 3 weeks.  He will contact our office with any questions or concerns. We can certainly see him sooner if needed.   Laverna Peace, NP 4/14/202112:00 PM

## 2020-03-20 LAB — IRON AND TIBC
Iron: 58 ug/dL (ref 42–163)
Saturation Ratios: 30 % (ref 20–55)
TIBC: 193 ug/dL — ABNORMAL LOW (ref 202–409)
UIBC: 135 ug/dL (ref 117–376)

## 2020-03-20 LAB — CHROMOGRANIN A: Chromogranin A (ng/mL): 90.3 ng/mL (ref 0.0–101.8)

## 2020-03-20 LAB — LACTATE DEHYDROGENASE: LDH: 260 U/L — ABNORMAL HIGH (ref 98–192)

## 2020-03-20 LAB — FERRITIN: Ferritin: 791 ng/mL — ABNORMAL HIGH (ref 24–336)

## 2020-03-24 ENCOUNTER — Other Ambulatory Visit: Payer: Self-pay | Admitting: Family

## 2020-03-24 ENCOUNTER — Other Ambulatory Visit: Payer: Self-pay | Admitting: Hematology & Oncology

## 2020-03-24 DIAGNOSIS — Q858 Other phakomatoses, not elsewhere classified: Secondary | ICD-10-CM

## 2020-03-24 DIAGNOSIS — C7A8 Other malignant neuroendocrine tumors: Secondary | ICD-10-CM

## 2020-03-24 DIAGNOSIS — D500 Iron deficiency anemia secondary to blood loss (chronic): Secondary | ICD-10-CM

## 2020-03-24 DIAGNOSIS — N319 Neuromuscular dysfunction of bladder, unspecified: Secondary | ICD-10-CM

## 2020-03-24 DIAGNOSIS — Q8583 Von Hippel-Lindau syndrome: Secondary | ICD-10-CM

## 2020-03-24 MED FILL — BACLOFEN 20 MG TABS: 20 | 15 days supply | Qty: 120 | Fill #0

## 2020-03-24 MED FILL — DRONABINOL 5 MG CAPSULE: 5 | 30 days supply | Qty: 60 | Fill #1

## 2020-03-26 ENCOUNTER — Ambulatory Visit: Payer: 59

## 2020-03-27 DIAGNOSIS — E109 Type 1 diabetes mellitus without complications: Secondary | ICD-10-CM | POA: Diagnosis not present

## 2020-03-27 DIAGNOSIS — Z794 Long term (current) use of insulin: Secondary | ICD-10-CM | POA: Diagnosis not present

## 2020-03-28 ENCOUNTER — Other Ambulatory Visit: Payer: Self-pay | Admitting: Family

## 2020-03-28 DIAGNOSIS — Q858 Other phakomatoses, not elsewhere classified: Secondary | ICD-10-CM

## 2020-03-28 DIAGNOSIS — N319 Neuromuscular dysfunction of bladder, unspecified: Secondary | ICD-10-CM

## 2020-03-28 DIAGNOSIS — D500 Iron deficiency anemia secondary to blood loss (chronic): Secondary | ICD-10-CM

## 2020-03-28 DIAGNOSIS — C7A8 Other malignant neuroendocrine tumors: Secondary | ICD-10-CM

## 2020-03-28 DIAGNOSIS — Q8583 Von Hippel-Lindau syndrome: Secondary | ICD-10-CM

## 2020-03-28 MED FILL — traMADol HCL 50 MG TABS: 50 | 30 days supply | Qty: 90 | Fill #0

## 2020-03-28 MED FILL — OXYBUTYNIN CHLORIDE 5 MG TA: 5 | 30 days supply | Qty: 60 | Fill #3

## 2020-04-03 NOTE — Progress Notes (Signed)
Pharmacist Chemotherapy Monitoring - Follow Up Assessment    I verify that I have reviewed each item in the below checklist:  . Regimen for the patient is scheduled for the appropriate day and plan matches scheduled date. Marland Kitchen Appropriate non-routine labs are ordered dependent on drug ordered. . If applicable, additional medications reviewed and ordered per protocol based on lifetime cumulative doses and/or treatment regimen.   Plan for follow-up and/or issues identified: No . I-vent associated with next due treatment: No . MD and/or nursing notified: No  [name], Jacqlyn Larsen 04/03/2020 1:27 PM

## 2020-04-03 NOTE — Progress Notes (Signed)
Pharmacist Chemotherapy Monitoring - Follow Up Assessment    I verify that I have reviewed each item in the below checklist:  . Regimen for the patient is scheduled for the appropriate day and plan matches scheduled date. Marland Kitchen Appropriate non-routine labs are ordered dependent on drug ordered. . If applicable, additional medications reviewed and ordered per protocol based on lifetime cumulative doses and/or treatment regimen.   Plan for follow-up and/or issues identified: No . I-vent associated with next due treatment: No . MD and/or nursing notified: No  [name], Jacqlyn Larsen 04/03/2020 2:25 PM

## 2020-04-04 ENCOUNTER — Other Ambulatory Visit: Payer: Self-pay | Admitting: Hematology & Oncology

## 2020-04-04 DIAGNOSIS — C7A8 Other malignant neuroendocrine tumors: Secondary | ICD-10-CM

## 2020-04-04 DIAGNOSIS — Z23 Encounter for immunization: Secondary | ICD-10-CM

## 2020-04-04 MED FILL — GABAPENTIN 300 MG CAPSULE: 300 | 20 days supply | Qty: 120 | Fill #0

## 2020-04-08 MED FILL — CREON DR 24,000 UNITS CAPSU: 24000-76000 | 30 days supply | Qty: 270 | Fill #2

## 2020-04-10 ENCOUNTER — Inpatient Hospital Stay: Payer: 59

## 2020-04-10 ENCOUNTER — Other Ambulatory Visit: Payer: Self-pay

## 2020-04-10 ENCOUNTER — Encounter: Payer: Self-pay | Admitting: Hematology & Oncology

## 2020-04-10 ENCOUNTER — Inpatient Hospital Stay: Payer: 59 | Attending: Hematology & Oncology | Admitting: Hematology & Oncology

## 2020-04-10 VITALS — BP 114/83 | HR 61 | Temp 97.8°F | Resp 18

## 2020-04-10 VITALS — BP 118/70 | HR 69 | Temp 98.4°F | Resp 18

## 2020-04-10 DIAGNOSIS — K922 Gastrointestinal hemorrhage, unspecified: Secondary | ICD-10-CM | POA: Insufficient documentation

## 2020-04-10 DIAGNOSIS — Z5112 Encounter for antineoplastic immunotherapy: Secondary | ICD-10-CM | POA: Diagnosis not present

## 2020-04-10 DIAGNOSIS — D500 Iron deficiency anemia secondary to blood loss (chronic): Secondary | ICD-10-CM | POA: Insufficient documentation

## 2020-04-10 DIAGNOSIS — C642 Malignant neoplasm of left kidney, except renal pelvis: Secondary | ICD-10-CM

## 2020-04-10 DIAGNOSIS — Q858 Other phakomatoses, not elsewhere classified: Secondary | ICD-10-CM | POA: Diagnosis not present

## 2020-04-10 DIAGNOSIS — C7A8 Other malignant neuroendocrine tumors: Secondary | ICD-10-CM | POA: Insufficient documentation

## 2020-04-10 DIAGNOSIS — Z794 Long term (current) use of insulin: Secondary | ICD-10-CM | POA: Insufficient documentation

## 2020-04-10 DIAGNOSIS — F410 Panic disorder [episodic paroxysmal anxiety] without agoraphobia: Secondary | ICD-10-CM | POA: Insufficient documentation

## 2020-04-10 DIAGNOSIS — C7800 Secondary malignant neoplasm of unspecified lung: Secondary | ICD-10-CM | POA: Insufficient documentation

## 2020-04-10 DIAGNOSIS — Q8583 Von Hippel-Lindau syndrome: Secondary | ICD-10-CM

## 2020-04-10 DIAGNOSIS — Z79899 Other long term (current) drug therapy: Secondary | ICD-10-CM | POA: Insufficient documentation

## 2020-04-10 LAB — CBC WITH DIFFERENTIAL (CANCER CENTER ONLY)
Abs Immature Granulocytes: 0.02 10*3/uL (ref 0.00–0.07)
Basophils Absolute: 0 10*3/uL (ref 0.0–0.1)
Basophils Relative: 0 %
Eosinophils Absolute: 0.1 10*3/uL (ref 0.0–0.5)
Eosinophils Relative: 2 %
HCT: 29 % — ABNORMAL LOW (ref 39.0–52.0)
Hemoglobin: 9.2 g/dL — ABNORMAL LOW (ref 13.0–17.0)
Immature Granulocytes: 0 %
Lymphocytes Relative: 14 %
Lymphs Abs: 0.8 10*3/uL (ref 0.7–4.0)
MCH: 33.2 pg (ref 26.0–34.0)
MCHC: 31.7 g/dL (ref 30.0–36.0)
MCV: 104.7 fL — ABNORMAL HIGH (ref 80.0–100.0)
Monocytes Absolute: 0.5 10*3/uL (ref 0.1–1.0)
Monocytes Relative: 10 %
Neutro Abs: 3.9 10*3/uL (ref 1.7–7.7)
Neutrophils Relative %: 74 %
Platelet Count: 100 10*3/uL — ABNORMAL LOW (ref 150–400)
RBC: 2.77 MIL/uL — ABNORMAL LOW (ref 4.22–5.81)
RDW: 16.8 % — ABNORMAL HIGH (ref 11.5–15.5)
WBC Count: 5.3 10*3/uL (ref 4.0–10.5)
nRBC: 0 % (ref 0.0–0.2)

## 2020-04-10 LAB — CMP (CANCER CENTER ONLY)
ALT: 9 U/L (ref 0–44)
AST: 15 U/L (ref 15–41)
Albumin: 3.1 g/dL — ABNORMAL LOW (ref 3.5–5.0)
Alkaline Phosphatase: 85 U/L (ref 38–126)
Anion gap: 6 (ref 5–15)
BUN: 20 mg/dL (ref 6–20)
CO2: 27 mmol/L (ref 22–32)
Calcium: 8.6 mg/dL — ABNORMAL LOW (ref 8.9–10.3)
Chloride: 103 mmol/L (ref 98–111)
Creatinine: 0.74 mg/dL (ref 0.61–1.24)
GFR, Est AFR Am: >60 mL/min (ref >60)
GFR, Estimated: >60 mL/min (ref >60)
Glucose, Bld: 198 mg/dL — ABNORMAL HIGH (ref 70–99)
Potassium: 4.1 mmol/L (ref 3.5–5.1)
Sodium: 136 mmol/L (ref 135–145)
Total Bilirubin: 0.4 mg/dL (ref 0.3–1.2)
Total Protein: 6.3 g/dL — ABNORMAL LOW (ref 6.5–8.1)

## 2020-04-10 LAB — LACTATE DEHYDROGENASE: LDH: 125 U/L (ref 98–192)

## 2020-04-10 MED ORDER — SODIUM CHLORIDE 0.9 % IV SOLN
Freq: Once | INTRAVENOUS | Status: DC
  Filled 2020-04-10: qty 250

## 2020-04-10 MED ORDER — SODIUM CHLORIDE 0.9 % IV SOLN
510.0000 mg | Freq: Once | INTRAVENOUS | Status: AC
  Administered 2020-04-10: 510 mg via INTRAVENOUS
  Filled 2020-04-10: qty 510

## 2020-04-10 MED ORDER — LANREOTIDE ACETATE 120 MG/0.5ML ~~LOC~~ SOLN
SUBCUTANEOUS | Status: AC
  Filled 2020-04-10: qty 120

## 2020-04-10 MED ORDER — FENTANYL 12 MCG/HR TD PT72: MEDICATED_PATCH | TRANSDERMAL | 0 refills | Status: AC

## 2020-04-10 MED ORDER — LANREOTIDE ACETATE 120 MG/0.5ML ~~LOC~~ SOLN
120.0000 mg | Freq: Once | SUBCUTANEOUS | Status: AC
  Administered 2020-04-10: 120 mg via SUBCUTANEOUS

## 2020-04-10 MED ORDER — SODIUM CHLORIDE 0.9 % IV SOLN
200.0000 mg | Freq: Once | INTRAVENOUS | Status: AC
  Administered 2020-04-10: 200 mg via INTRAVENOUS
  Filled 2020-04-10: qty 8

## 2020-04-10 NOTE — Progress Notes (Signed)
Hematology and Oncology Follow Up Visit  Karam Goguen [id number] February 09, 1961 59 y.o. 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  Past Therapy: Temodar/Xeloda - start 03/17/2016 - s/p c#3 - discontinued Inlyta/Keytruda -- start on 08/08/2019 -- d/c on 08/26/2019 due to toxicity Lutathera -- cycle #4/4 on 12/19/2019  Current Therapy:  Somatuline 120mg  SQ q month IV iron as indicated-Feraheme given on 11/20/2019 Cabometyx/Keytruda -- s/p cycle 1 - started on 02/07/2020 -  Cabometyx 20 mg po q day -- dose changed on 04/10/2020   Interim History:  Mr. Fahrer is here today for follow-up.  I think he might be having problems with the 40 mg dose of Cabometyx.  He has had some issues with respect to some panic attacks.  There is been issues with blood pressure variations.  We had him stop the Cabometyx a couple weeks ago.  I really would hate to stop it completely.  As such, I think we can try to cut him down to the 20 mg daily dose.  He is worried about maybe some tumor progression in his brain and cervical spine.  He is not had MRI for a while.  I will see about getting an MRI set up for him for the both the brain and the cervical spine.  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  I am shocked that he actually can feel bladder spasms.  I really thought that he was paralyzed from the abdomen down.  He cannot feel the Somatuline injections that we give him.  He does feel these bladder spasms.  Again I am not sure as to why he has these.  He has had no problems with bleeding.  He has had no fever.  We got iron studies on him today.  His ferritin is 414 with an iron saturation of 35%.  His hemoglobin is down to 9.2.  This is also a little bit suspicious.   His platelet count is 100,000.  This is improving.  He has had no obvious headache.  He has had no visual changes.  Overall, I would say his performance status is ECOG 2.   Medications:  Allergies as of 04/10/2020      Reactions   Ciprofloxacin Hives   IV only   Citalopram Diarrhea   Ciprocin-fluocin-procin [fluocinolone Acetonide] Rash   Pt states this happened with IV Cipro. ABLE TO TAKE PO CIPRO.      Medication List       Accurate as of Apr 10, 2020 12:19 PM. If you have any questions, ask your nurse or doctor.        Accu-Chek SmartView test strip Generic drug: glucose blood   baclofen 20 MG tablet Commonly known as: LIORESAL TAKE 1 TO 2 TABLETS BY MOUTH 4 TIMES A DAY   Baqsimi One Pack 3 MG/DOSE Powd Generic drug: Glucagon Place 1 spray into the nose daily as needed (low glucose).   Cabometyx 40 MG tablet Generic drug: cabozantinib Take 1 tablet (40 mg total) by mouth daily. Take on an empty stomach, 1 hour before or 2 hours after meals.   cholecalciferol 25 MCG (1000 UNIT) tablet Commonly known as: VITAMIN D3 Take 1,000 Units by mouth daily.   CRANBERRY CONCENTRATE PO Take 1 capsule by mouth 3 (three) times daily.   Creon 24000-76000 units Cpep  Generic drug: Pancrelipase (Lip-Prot-Amyl) TAKE 3 CAPSULES BY MOUTH THREE TIMES DAILY WITH MEALS   diazepam 5 MG tablet Commonly known as: VALIUM TAKE 1 TABLET BY MOUTH 3 TIMES DAILY FOR SPASMS/ANXIETY   diphenoxylate-atropine 2.5-0.025 MG tablet Commonly known as: LOMOTIL TAKE 1 TABLET BY MOUTH 4 TIMES DAILY AS NEEDED FOR DIARRHEA OR LOOSE STOOLS   doxazosin 2 MG tablet Commonly known as: Cardura Take 1 tablet (2 mg total) by mouth 2 (two) times daily.   dronabinol 5 MG capsule Commonly known as: MARINOL TAKE 1 CAPSULE BY MOUTH TWICE DAILY WITH MEALS   fentaNYL 12 MCG/HR Commonly known as: DURAGESIC APPLY 1 PATCH ON THE SKIN EVERY 3 DAYS   gabapentin 300 MG capsule Commonly known as: NEURONTIN TAKE 2  CAPSULES BY MOUTH THREE TIMES DAILY   HYDROcodone-acetaminophen 5-325 MG tablet Commonly known as: NORCO/VICODIN Take 1-2 tablets by mouth every 6 (six) hours as needed for moderate pain.   HYDROmorphone 4 MG tablet Commonly known as: DILAUDID Take 1 tablet (4 mg total) by mouth every 6 (six) hours as needed for severe pain.   insulin glargine 100 UNIT/ML injection Commonly known as: LANTUS Inject 25 Units into the skin daily before breakfast.   insulin lispro 100 UNIT/ML injection Commonly known as: HUMALOG Inject 3-9 Units into the skin 3 (three) times daily before meals. SS   midodrine 5 MG tablet Commonly known as: PROAMATINE TAKE 0.5 TABLET BY MOUTH TWICE A DAY AS NEEDED What changed: See the new instructions.   oxybutynin 5 MG tablet Commonly known as: DITROPAN TAKE 1 TABLET BY MOUTH TWICE DAILY FOR BLADDER SPASMS IF NEEDED   promethazine 12.5 MG tablet Commonly known as: PHENERGAN Take 1 tablet (12.5 mg total) by mouth every 6 (six) hours as needed for nausea.   solifenacin 5 MG tablet Commonly known as: VESICARE TAKE 1 TABLET BY MOUTH ONCE DAILY   tiZANidine 4 MG tablet Commonly known as: ZANAFLEX Take 2 tablets (8 mg total) by mouth every 6 (six) hours as needed. For pain   traMADol 50 MG tablet Commonly known as: ULTRAM TAKE 1 TABLET BY MOUTH 3 TIMES DAILY   vitamin C 1000 MG tablet Take 1,000 mg by mouth daily.       Allergies:  Allergies  Allergen Reactions  . Ciprofloxacin Hives    IV only  . Citalopram Diarrhea  . Ciprocin-Fluocin-Procin [Fluocinolone Acetonide] Rash    Pt states this happened with IV Cipro. ABLE TO TAKE PO CIPRO.    Past Medical History, Surgical history, Social history, and Family History were reviewed and updated.  Review of Systems:  Review of Systems  Constitutional: Negative.   HENT: Negative.   Eyes: Negative.   Respiratory: Negative.   Cardiovascular: Negative.   Gastrointestinal: Negative.   Genitourinary:  Negative.   Musculoskeletal: Negative.   Skin: Negative.   Neurological: Positive for tingling and focal weakness.  Endo/Heme/Allergies: Negative.   Psychiatric/Behavioral: Negative.     Physical Exam:  temporal temperature is 97.8 F (36.6 C). His blood pressure is 114/83 and his pulse is 61. His respiration is 18 and oxygen saturation is 100%.   Wt Readings from Last 3 Encounters:  02/27/20 165 lb (74.8 kg)  07/27/19 161 lb (73 kg)  07/12/19 160 lb (72.6 kg)    Physical Exam Vitals reviewed.  HENT:     Head: Normocephalic and atraumatic.  Eyes:     Pupils: Pupils are equal, round, and reactive to light.  Cardiovascular:  Rate and Rhythm: Normal rate and regular rhythm.     Heart sounds: Normal heart sounds.  Pulmonary:     Effort: Pulmonary effort is normal.     Breath sounds: Normal breath sounds.  Abdominal:     General: Bowel sounds are normal.     Palpations: Abdomen is soft.  Musculoskeletal:        General: No tenderness or deformity. Normal range of motion.     Cervical back: Normal range of motion.  Lymphadenopathy:     Cervical: No cervical adenopathy.  Skin:    General: Skin is warm and dry.     Findings: No erythema or rash.  Neurological:     Mental Status: He is alert and oriented to person, place, and time.  Psychiatric:        Behavior: Behavior normal.        Thought Content: Thought content normal.        Judgment: Judgment normal.      Lab Results  Component Value Date   WBC 5.3 04/10/2020   HGB 9.2 (L) 04/10/2020   HCT 29.0 (L) 04/10/2020   MCV 104.7 (H) 04/10/2020   PLT 100 (L) 04/10/2020   Lab Results  Component Value Date   FERRITIN 791 (H) 03/19/2020   IRON 58 03/19/2020   TIBC 193 (L) 03/19/2020   UIBC 135 03/19/2020   IRONPCTSAT 30 03/19/2020   Lab Results  Component Value Date   RETICCTPCT 0.9 02/07/2019   RBC 2.77 (L) 04/10/2020   RETICCTABS 18.8 (L) 03/21/2009   No results found for: KPAFRELGTCHN, LAMBDASER,  KAPLAMBRATIO No results found for: IGGSERUM, IGA, IGMSERUM No results found for: Kathrynn Ducking, MSPIKE, SPEI   Chemistry      Component Value Date/Time   NA 136 04/10/2020 1104   NA 137 12/05/2017 0840   NA 138 10/12/2016 0955   K 4.1 04/10/2020 1104   K 3.4 12/05/2017 0840   K 3.3 (L) 10/12/2016 0955   CL 103 04/10/2020 1104   CL 101 12/05/2017 0840   CO2 27 04/10/2020 1104   CO2 29 12/05/2017 0840   CO2 25 10/12/2016 0955   BUN 20 04/10/2020 1104   BUN 16 12/05/2017 0840   BUN 22.0 10/12/2016 0955   CREATININE 0.74 04/10/2020 1104   CREATININE 0.8 12/05/2017 0840   CREATININE 0.8 10/12/2016 0955      Component Value Date/Time   CALCIUM 8.6 (L) 04/10/2020 1104   CALCIUM 8.6 12/05/2017 0840   CALCIUM 9.0 10/12/2016 0955   ALKPHOS 85 04/10/2020 1104   ALKPHOS 139 (H) 12/05/2017 0840   ALKPHOS 159 (H) 10/12/2016 0955   AST 15 04/10/2020 1104   AST 45 (H) 10/12/2016 0955   ALT 9 04/10/2020 1104   ALT 19 12/05/2017 0840   ALT 29 10/12/2016 0955   BILITOT 0.4 04/10/2020 1104   BILITOT 0.62 10/12/2016 0955       Impression and Plan: Mr. Poyser is 59 yo caucasian gentleman with von Hippel-Lindau syndrome. 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   He has the renal cell carcinoma that I suspect is now metastatic.  I really think this is going to be the problem going forward.  I still think we have to try to treat the renal cell carcinoma if possible.  He will get his Keytruda today.  I am cutting back the dose of the Cabometyx to 20 mg daily.  Hopefully he will do okay with this dose.   I will set him up with a MRI of the brain and of the cervical spine so we can see if the hemangioblastomas are worsening.  I know that he is doing everything that he can do.  I just feel bad for him.  Thankfully, he has a wonderful wife who is incredibly dedicated and she  really has a fantastic job in making sure that he has what he needs.  We will plan to give him the Somatuline today.  I do not think we have to do any scans on him for the renal cell carcinoma probably until July or August.  We will plan to get him back in another 4 weeks.     Volanda Napoleon, MD 5/6/202112:19 PM

## 2020-04-10 NOTE — Patient Instructions (Signed)
Lanreotide injection What is this medicine? LANREOTIDE (lan REE oh tide) is used to reduce blood levels of growth hormone in patients with a condition called acromegaly. It also works to slow or stop tumor growth in patients with neuroendocrine tumors and treat carcinoid syndrome. This medicine may be used for other purposes; ask your health care provider or pharmacist if you have questions. COMMON BRAND NAME(S): Somatuline [place] What should I tell my health care provider before I take this medicine? They need to know if you have any of these conditions:  diabetes  gallbladder disease  heart disease  kidney disease  liver disease  thyroid disease  an unusual or allergic reaction to lanreotide, other medicines, foods, dyes, or preservatives  pregnant or trying to get pregnant  breast-feeding How should I use this medicine? This medicine is for injection under the skin. It is given by a health care professional in a hospital or clinic setting. Contact your pediatrician or health care professional regarding the use of this medicine in children. Special care may be needed. Overdosage: If you think you have taken too much of this medicine contact a poison control center or emergency room at once. NOTE: This medicine is only for you. Do not share this medicine with others. What if I miss a dose? It is important not to miss your dose. Call your doctor or health care professional if you are unable to keep an appointment. What may interact with this medicine? This medicine may interact with the following medications:  bromocriptine  cyclosporine  certain medicines for blood pressure, heart disease, irregular heart beat  certain medicines for diabetes  quinidine  terfenadine This list may not describe all possible interactions. Give your health care provider a list of all the medicines, herbs, non-prescription drugs, or dietary supplements you use. Also tell them if you smoke,  drink alcohol, or use illegal drugs. Some items may interact with your medicine. What should I watch for while using this medicine? Tell your doctor or healthcare professional if your symptoms do not start to get better or if they get worse. Visit your doctor or health care professional for regular checks on your progress. Your condition will be monitored carefully while you are receiving this medicine. This medicine may increase blood sugar. Ask your healthcare provider if changes in diet or medicines are needed if you have diabetes. You may need blood work done while you are taking this medicine. Women should inform their doctor if they wish to become pregnant or think they might be pregnant. There is a potential for serious side effects to an unborn child. Talk to your health care professional or pharmacist for more information. Do not breast-feed an infant while taking this medicine or for 6 months after stopping it. This medicine has caused ovarian failure in some women. This medicine may interfere with the ability to have a child. Talk with your doctor or health care professional if you are concerned about your fertility. What side effects may I notice from receiving this medicine? Side effects that you should report to your doctor or health care professional as soon as possible:  allergic reactions like skin rash, itching or hives, swelling of the face, lips, or tongue  increased blood pressure  severe stomach pain  signs and symptoms of hgh blood sugar such as being more thirsty or hungry or having to urinate more than normal. You may also feel very tired or have blurry vision.  signs and symptoms of low blood   sugar such as feeling anxious; confusion; dizziness; increased hunger; unusually weak or tired; sweating; shakiness; cold; irritable; headache; blurred vision; fast heartbeat; loss of consciousness  unusually slow heartbeat Side effects that usually do not require medical  attention (report to your doctor or health care professional if they continue or are bothersome):  constipation  diarrhea  dizziness  headache  muscle pain  muscle spasms  nausea  pain, redness, or irritation at site where injected This list may not describe all possible side effects. Call your doctor for medical advice about side effects. You may report side effects to FDA at 1-800-FDA-1088. Where should I keep my medicine? This drug is given in a hospital or clinic and will not be stored at home. NOTE: This sheet is a summary. It may not cover all possible information. If you have questions about this medicine, talk to your doctor, pharmacist, or health care provider.  2020 Elsevier/Gold Standard (2018-08-31 09:13:08)  

## 2020-04-10 NOTE — Progress Notes (Signed)
Pt. had a panic attack on his way to the exam room. Scherrie Bateman, NP notified. She verbally ordered O2 at 2 L. VS within normal limits. Pt. Back to normal within minutes.

## 2020-04-11 ENCOUNTER — Other Ambulatory Visit: Payer: Self-pay | Admitting: *Deleted

## 2020-04-11 ENCOUNTER — Encounter: Payer: Self-pay | Admitting: Hematology & Oncology

## 2020-04-11 ENCOUNTER — Telehealth: Payer: Self-pay | Admitting: Hematology & Oncology

## 2020-04-11 LAB — IRON AND TIBC
Iron: 68 ug/dL (ref 42–163)
Saturation Ratios: 35 % (ref 20–55)
TIBC: 191 ug/dL — ABNORMAL LOW (ref 202–409)
UIBC: 124 ug/dL (ref 117–376)

## 2020-04-11 LAB — CHROMOGRANIN A: Chromogranin A (ng/mL): 71.1 ng/mL (ref 0.0–101.8)

## 2020-04-11 LAB — FERRITIN: Ferritin: 414 ng/mL — ABNORMAL HIGH (ref 24–336)

## 2020-04-11 MED ORDER — CABOMETYX 20 MG PO TABS: 20.0000 mg | ORAL_TABLET | Freq: Every day | ORAL | 3 refills | Status: DC

## 2020-04-11 MED FILL — SOLIFENACIN SUCCINATE 5 MG: 5 | 30 days supply | Qty: 30 | Fill #1

## 2020-04-11 NOTE — Telephone Encounter (Signed)
No los 5/6

## 2020-04-17 ENCOUNTER — Encounter: Payer: Self-pay | Admitting: Hematology & Oncology

## 2020-04-18 ENCOUNTER — Other Ambulatory Visit: Payer: Self-pay | Admitting: *Deleted

## 2020-04-18 ENCOUNTER — Encounter: Payer: Self-pay | Admitting: Hematology & Oncology

## 2020-04-18 MED ORDER — CABOMETYX 20 MG PO TABS: 20.0000 mg | ORAL_TABLET | Freq: Every day | ORAL | 3 refills | Status: DC

## 2020-04-21 ENCOUNTER — Encounter: Payer: Self-pay | Admitting: Hematology & Oncology

## 2020-04-22 ENCOUNTER — Other Ambulatory Visit: Payer: Self-pay | Admitting: *Deleted

## 2020-04-22 ENCOUNTER — Encounter: Payer: Self-pay | Admitting: Hematology & Oncology

## 2020-04-22 ENCOUNTER — Telehealth: Payer: Self-pay | Admitting: Pharmacy Technician

## 2020-04-22 MED ORDER — CABOMETYX 20 MG PO TABS: 20.0000 mg | ORAL_TABLET | Freq: Every day | ORAL | 3 refills | Status: AC

## 2020-04-23 ENCOUNTER — Telehealth: Payer: Self-pay | Admitting: *Deleted

## 2020-04-23 NOTE — Telephone Encounter (Signed)
Oral Oncology Patient Advocate Encounter  Received notification from [employer] that prior authorization for Cabometyx (20mg ) is required.  PA submitted on CoverMyMeds Key [id number] Status is pending  Oral Oncology Clinic will continue to follow.   [name] Patient [place] Phone [phone] Fax [phone]

## 2020-04-23 NOTE — Telephone Encounter (Signed)
Spoke to wife and patient passed away yesterday.

## 2020-04-23 NOTE — Telephone Encounter (Signed)
Called wife [name] to express condolences on the passing of her husband Jacobs.  Flowers sent to family per Dr. Dicie Beam request

## 2020-04-23 NOTE — Telephone Encounter (Signed)
Oral Oncology Patient Advocate Encounter  Prior Authorization for Cabometyx (20mg ) has been approved.    PA# [id number] Effective dates: 07-May-2020 through 04/21/2021 Max of 12 fills within effective dates.  Patients co-pay is $0.00.  Oral Oncology Clinic will continue to follow.   [place] Patient [place] Phone [phone] Fax [phone] 04/23/2020 8:33 AM

## 2020-04-30 ENCOUNTER — Encounter: Payer: Self-pay | Admitting: *Deleted

## 2020-04-30 NOTE — Progress Notes (Signed)
Mailed death certificate to: [facility] Department Attn: [facility] [place]-Vital Records [address] [place], [place] [address]

## 2020-05-06 DIAGNOSIS — 419620001 Death: Secondary | SNOMED CT | POA: Diagnosis not present

## 2020-05-06 DEATH — deceased

## 2020-05-07 ENCOUNTER — Other Ambulatory Visit: Payer: 59

## 2020-05-07 ENCOUNTER — Ambulatory Visit: Payer: 59 | Admitting: Hematology & Oncology

## 2020-05-07 ENCOUNTER — Ambulatory Visit: Payer: 59

## 2020-05-08 ENCOUNTER — Ambulatory Visit (HOSPITAL_COMMUNITY): Payer: 59

## 2020-05-14 ENCOUNTER — Ambulatory Visit: Payer: 59 | Admitting: Hematology & Oncology

## 2020-05-14 ENCOUNTER — Ambulatory Visit: Payer: 59

## 2020-05-14 ENCOUNTER — Other Ambulatory Visit: Payer: 59
